# Patient Record
Sex: Female | Born: 1945 | Race: White | Hispanic: No | Marital: Married | Smoking: Former smoker
Health system: Southern US, Community
[De-identification: ages and names within clinical notes are randomized; demographics above are authoritative.]

## PROBLEM LIST (undated history)

## (undated) DIAGNOSIS — M479 Spondylosis, unspecified: Secondary | ICD-10-CM

## (undated) DIAGNOSIS — K52832 Lymphocytic colitis: Secondary | ICD-10-CM

## (undated) DIAGNOSIS — D649 Anemia, unspecified: Secondary | ICD-10-CM

## (undated) DIAGNOSIS — J45909 Unspecified asthma, uncomplicated: Secondary | ICD-10-CM

## (undated) DIAGNOSIS — Z7982 Long term (current) use of aspirin: Secondary | ICD-10-CM

## (undated) DIAGNOSIS — G2581 Restless legs syndrome: Secondary | ICD-10-CM

## (undated) DIAGNOSIS — G629 Polyneuropathy, unspecified: Secondary | ICD-10-CM

## (undated) DIAGNOSIS — I5189 Other ill-defined heart diseases: Secondary | ICD-10-CM

## (undated) DIAGNOSIS — I773 Arterial fibromuscular dysplasia: Secondary | ICD-10-CM

## (undated) DIAGNOSIS — I700 Atherosclerosis of aorta: Secondary | ICD-10-CM

## (undated) DIAGNOSIS — K5710 Diverticulosis of small intestine without perforation or abscess without bleeding: Secondary | ICD-10-CM

## (undated) DIAGNOSIS — G4700 Insomnia, unspecified: Secondary | ICD-10-CM

## (undated) DIAGNOSIS — K4090 Unilateral inguinal hernia, without obstruction or gangrene, not specified as recurrent: Secondary | ICD-10-CM

## (undated) DIAGNOSIS — E785 Hyperlipidemia, unspecified: Secondary | ICD-10-CM

## (undated) DIAGNOSIS — Z7902 Long term (current) use of antithrombotics/antiplatelets: Secondary | ICD-10-CM

## (undated) DIAGNOSIS — K589 Irritable bowel syndrome without diarrhea: Secondary | ICD-10-CM

## (undated) DIAGNOSIS — R0602 Shortness of breath: Secondary | ICD-10-CM

## (undated) DIAGNOSIS — G4733 Obstructive sleep apnea (adult) (pediatric): Secondary | ICD-10-CM

## (undated) DIAGNOSIS — F329 Major depressive disorder, single episode, unspecified: Secondary | ICD-10-CM

## (undated) DIAGNOSIS — T8859XA Other complications of anesthesia, initial encounter: Secondary | ICD-10-CM

## (undated) DIAGNOSIS — I951 Orthostatic hypotension: Secondary | ICD-10-CM

## (undated) DIAGNOSIS — R002 Palpitations: Secondary | ICD-10-CM

## (undated) DIAGNOSIS — K219 Gastro-esophageal reflux disease without esophagitis: Secondary | ICD-10-CM

## (undated) DIAGNOSIS — I209 Angina pectoris, unspecified: Secondary | ICD-10-CM

## (undated) DIAGNOSIS — I341 Nonrheumatic mitral (valve) prolapse: Secondary | ICD-10-CM

## (undated) DIAGNOSIS — H269 Unspecified cataract: Secondary | ICD-10-CM

## (undated) DIAGNOSIS — K5790 Diverticulosis of intestine, part unspecified, without perforation or abscess without bleeding: Secondary | ICD-10-CM

## (undated) DIAGNOSIS — Z87442 Personal history of urinary calculi: Secondary | ICD-10-CM

## (undated) DIAGNOSIS — F32A Depression, unspecified: Secondary | ICD-10-CM

## (undated) DIAGNOSIS — G4730 Sleep apnea, unspecified: Secondary | ICD-10-CM

## (undated) DIAGNOSIS — I471 Supraventricular tachycardia, unspecified: Secondary | ICD-10-CM

## (undated) DIAGNOSIS — K222 Esophageal obstruction: Secondary | ICD-10-CM

## (undated) DIAGNOSIS — R112 Nausea with vomiting, unspecified: Secondary | ICD-10-CM

## (undated) DIAGNOSIS — I701 Atherosclerosis of renal artery: Secondary | ICD-10-CM

## (undated) DIAGNOSIS — F419 Anxiety disorder, unspecified: Secondary | ICD-10-CM

## (undated) DIAGNOSIS — M810 Age-related osteoporosis without current pathological fracture: Secondary | ICD-10-CM

## (undated) DIAGNOSIS — T7840XA Allergy, unspecified, initial encounter: Secondary | ICD-10-CM

## (undated) DIAGNOSIS — R001 Bradycardia, unspecified: Secondary | ICD-10-CM

## (undated) DIAGNOSIS — G43909 Migraine, unspecified, not intractable, without status migrainosus: Secondary | ICD-10-CM

## (undated) DIAGNOSIS — Z9889 Other specified postprocedural states: Secondary | ICD-10-CM

## (undated) DIAGNOSIS — K551 Chronic vascular disorders of intestine: Secondary | ICD-10-CM

## (undated) DIAGNOSIS — M797 Fibromyalgia: Secondary | ICD-10-CM

## (undated) DIAGNOSIS — Z9841 Cataract extraction status, right eye: Secondary | ICD-10-CM

## (undated) DIAGNOSIS — Z8489 Family history of other specified conditions: Secondary | ICD-10-CM

## (undated) DIAGNOSIS — I779 Disorder of arteries and arterioles, unspecified: Secondary | ICD-10-CM

## (undated) DIAGNOSIS — I2510 Atherosclerotic heart disease of native coronary artery without angina pectoris: Secondary | ICD-10-CM

## (undated) DIAGNOSIS — T4145XA Adverse effect of unspecified anesthetic, initial encounter: Secondary | ICD-10-CM

## (undated) DIAGNOSIS — K449 Diaphragmatic hernia without obstruction or gangrene: Secondary | ICD-10-CM

## (undated) DIAGNOSIS — M8580 Other specified disorders of bone density and structure, unspecified site: Secondary | ICD-10-CM

## (undated) HISTORY — PX: TUBAL LIGATION: SHX77

## (undated) HISTORY — DX: Migraine, unspecified, not intractable, without status migrainosus: G43.909

## (undated) HISTORY — DX: Spondylosis, unspecified: M47.9

## (undated) HISTORY — DX: Diaphragmatic hernia without obstruction or gangrene: K44.9

## (undated) HISTORY — DX: Age-related osteoporosis without current pathological fracture: M81.0

## (undated) HISTORY — PX: ROTATOR CUFF REPAIR: SHX139

## (undated) HISTORY — PX: UPPER GASTROINTESTINAL ENDOSCOPY: SHX188

## (undated) HISTORY — PX: BREAST ENHANCEMENT SURGERY: SHX7

## (undated) HISTORY — DX: Irritable bowel syndrome, unspecified: K58.9

## (undated) HISTORY — DX: Sleep apnea, unspecified: G47.30

## (undated) HISTORY — DX: Polyneuropathy, unspecified: G62.9

## (undated) HISTORY — DX: Unspecified cataract: H26.9

## (undated) HISTORY — DX: Fibromyalgia: M79.7

## (undated) HISTORY — DX: Esophageal obstruction: K22.2

## (undated) HISTORY — PX: CATARACT EXTRACTION: SUR2

## (undated) HISTORY — DX: Unspecified asthma, uncomplicated: J45.909

## (undated) HISTORY — PX: ESOPHAGEAL DILATION: SHX303

## (undated) HISTORY — PX: WRIST ARTHROSCOPY: SUR100

## (undated) HISTORY — DX: Diverticulosis of intestine, part unspecified, without perforation or abscess without bleeding: K57.90

## (undated) HISTORY — PX: OTHER SURGICAL HISTORY: SHX169

## (undated) HISTORY — DX: Restless legs syndrome: G25.81

## (undated) HISTORY — PX: COLONOSCOPY: SHX174

## (undated) HISTORY — DX: Depression, unspecified: F32.A

## (undated) HISTORY — PX: SINUS SURGERY WITH INSTATRAK: SHX5215

## (undated) HISTORY — DX: Nonrheumatic mitral (valve) prolapse: I34.1

## (undated) HISTORY — DX: Allergy, unspecified, initial encounter: T78.40XA

## (undated) HISTORY — DX: Anxiety disorder, unspecified: F41.9

## (undated) HISTORY — DX: Anemia, unspecified: D64.9

## (undated) HISTORY — DX: Hyperlipidemia, unspecified: E78.5

## (undated) HISTORY — DX: Diverticulosis of small intestine without perforation or abscess without bleeding: K57.10

## (undated) HISTORY — DX: Gastro-esophageal reflux disease without esophagitis: K21.9

## (undated) HISTORY — DX: Lymphocytic colitis: K52.832

## (undated) HISTORY — DX: Other specified disorders of bone density and structure, unspecified site: M85.80

## (undated) HISTORY — PX: CHOLECYSTECTOMY: SHX55

## (undated) HISTORY — DX: Major depressive disorder, single episode, unspecified: F32.9

---

## 1998-03-26 ENCOUNTER — Emergency Department (HOSPITAL_COMMUNITY): Admission: EM | Admit: 1998-03-26 | Discharge: 1998-03-26 | Payer: Self-pay | Admitting: Emergency Medicine

## 1998-03-26 ENCOUNTER — Encounter: Payer: Self-pay | Admitting: Emergency Medicine

## 1999-09-17 ENCOUNTER — Other Ambulatory Visit: Admission: RE | Admit: 1999-09-17 | Discharge: 1999-09-17 | Payer: Self-pay | Admitting: Gynecology

## 2000-01-01 ENCOUNTER — Encounter: Payer: Self-pay | Admitting: Gastroenterology

## 2000-01-01 DIAGNOSIS — K5730 Diverticulosis of large intestine without perforation or abscess without bleeding: Secondary | ICD-10-CM | POA: Insufficient documentation

## 2000-05-12 ENCOUNTER — Encounter: Admission: RE | Admit: 2000-05-12 | Discharge: 2000-06-09 | Payer: Self-pay | Admitting: Specialist

## 2000-09-22 ENCOUNTER — Other Ambulatory Visit: Admission: RE | Admit: 2000-09-22 | Discharge: 2000-09-22 | Payer: Self-pay | Admitting: Gynecology

## 2001-08-16 ENCOUNTER — Encounter: Payer: Self-pay | Admitting: Emergency Medicine

## 2001-08-16 ENCOUNTER — Inpatient Hospital Stay (HOSPITAL_COMMUNITY): Admission: EM | Admit: 2001-08-16 | Discharge: 2001-08-18 | Payer: Self-pay | Admitting: Emergency Medicine

## 2001-08-17 ENCOUNTER — Encounter: Payer: Self-pay | Admitting: Cardiology

## 2001-08-18 ENCOUNTER — Encounter: Payer: Self-pay | Admitting: Internal Medicine

## 2001-08-21 ENCOUNTER — Emergency Department (HOSPITAL_COMMUNITY): Admission: EM | Admit: 2001-08-21 | Discharge: 2001-08-22 | Payer: Self-pay | Admitting: Emergency Medicine

## 2001-11-22 ENCOUNTER — Other Ambulatory Visit: Admission: RE | Admit: 2001-11-22 | Discharge: 2001-11-22 | Payer: Self-pay | Admitting: Gynecology

## 2002-06-03 ENCOUNTER — Ambulatory Visit (HOSPITAL_BASED_OUTPATIENT_CLINIC_OR_DEPARTMENT_OTHER): Admission: RE | Admit: 2002-06-03 | Discharge: 2002-06-03 | Payer: Self-pay | Admitting: Gynecology

## 2002-07-13 ENCOUNTER — Encounter: Admission: RE | Admit: 2002-07-13 | Discharge: 2002-07-13 | Payer: Self-pay | Admitting: Internal Medicine

## 2002-07-13 ENCOUNTER — Encounter: Payer: Self-pay | Admitting: Internal Medicine

## 2002-08-15 ENCOUNTER — Encounter: Payer: Self-pay | Admitting: Surgery

## 2002-08-15 ENCOUNTER — Encounter: Admission: RE | Admit: 2002-08-15 | Discharge: 2002-08-15 | Payer: Self-pay | Admitting: Surgery

## 2003-01-11 ENCOUNTER — Other Ambulatory Visit: Admission: RE | Admit: 2003-01-11 | Discharge: 2003-01-11 | Payer: Self-pay | Admitting: Gynecology

## 2004-04-26 ENCOUNTER — Ambulatory Visit: Payer: Self-pay | Admitting: Internal Medicine

## 2004-04-29 ENCOUNTER — Ambulatory Visit: Payer: Self-pay | Admitting: Internal Medicine

## 2004-05-01 ENCOUNTER — Ambulatory Visit: Payer: Self-pay

## 2004-05-14 ENCOUNTER — Ambulatory Visit: Payer: Self-pay | Admitting: Internal Medicine

## 2004-07-08 ENCOUNTER — Other Ambulatory Visit: Admission: RE | Admit: 2004-07-08 | Discharge: 2004-07-08 | Payer: Self-pay | Admitting: Gynecology

## 2004-10-22 ENCOUNTER — Ambulatory Visit: Payer: Self-pay | Admitting: Internal Medicine

## 2004-11-27 ENCOUNTER — Ambulatory Visit: Payer: Self-pay | Admitting: Internal Medicine

## 2004-12-11 ENCOUNTER — Ambulatory Visit: Payer: Self-pay | Admitting: Internal Medicine

## 2005-01-10 ENCOUNTER — Ambulatory Visit: Payer: Self-pay | Admitting: Internal Medicine

## 2005-01-16 ENCOUNTER — Ambulatory Visit: Payer: Self-pay | Admitting: Internal Medicine

## 2005-02-11 ENCOUNTER — Ambulatory Visit: Payer: Self-pay | Admitting: Gastroenterology

## 2005-02-17 ENCOUNTER — Ambulatory Visit: Payer: Self-pay | Admitting: Gastroenterology

## 2005-02-17 DIAGNOSIS — K2990 Gastroduodenitis, unspecified, without bleeding: Secondary | ICD-10-CM

## 2005-02-17 DIAGNOSIS — K2970 Gastritis, unspecified, without bleeding: Secondary | ICD-10-CM | POA: Insufficient documentation

## 2005-04-16 ENCOUNTER — Ambulatory Visit: Payer: Self-pay | Admitting: Internal Medicine

## 2005-04-21 ENCOUNTER — Ambulatory Visit: Payer: Self-pay | Admitting: Internal Medicine

## 2005-04-25 ENCOUNTER — Ambulatory Visit: Payer: Self-pay | Admitting: Internal Medicine

## 2005-07-16 ENCOUNTER — Other Ambulatory Visit: Admission: RE | Admit: 2005-07-16 | Discharge: 2005-07-16 | Payer: Self-pay | Admitting: Gynecology

## 2005-10-21 ENCOUNTER — Ambulatory Visit (HOSPITAL_BASED_OUTPATIENT_CLINIC_OR_DEPARTMENT_OTHER): Admission: RE | Admit: 2005-10-21 | Discharge: 2005-10-21 | Payer: Self-pay | Admitting: Otolaryngology

## 2005-10-25 ENCOUNTER — Ambulatory Visit: Payer: Self-pay | Admitting: Internal Medicine

## 2006-02-01 ENCOUNTER — Ambulatory Visit (HOSPITAL_BASED_OUTPATIENT_CLINIC_OR_DEPARTMENT_OTHER): Admission: RE | Admit: 2006-02-01 | Discharge: 2006-02-01 | Payer: Self-pay | Admitting: Otolaryngology

## 2006-04-18 ENCOUNTER — Ambulatory Visit: Payer: Self-pay | Admitting: Family Medicine

## 2006-06-11 ENCOUNTER — Ambulatory Visit: Payer: Self-pay | Admitting: Internal Medicine

## 2006-06-11 LAB — CONVERTED CEMR LAB
ALT: 14 units/L (ref 0–40)
Albumin: 3.8 g/dL (ref 3.5–5.2)
Alkaline Phosphatase: 51 units/L (ref 39–117)
BUN: 11 mg/dL (ref 6–23)
Basophils Absolute: 0 10*3/uL (ref 0.0–0.1)
Calcium: 9.2 mg/dL (ref 8.4–10.5)
Cholesterol: 146 mg/dL (ref 0–200)
GFR calc Af Amer: 130 mL/min
GFR calc non Af Amer: 108 mL/min
HDL: 59.9 mg/dL (ref >39.0)
Hemoglobin: 12.4 g/dL (ref 12.0–15.0)
LDL Cholesterol: 75 mg/dL (ref 0–99)
Lymphocytes Relative: 30.6 % (ref 12.0–46.0)
MCHC: 34 g/dL (ref 30.0–36.0)
Monocytes Absolute: 0.3 10*3/uL (ref 0.2–0.7)
Monocytes Relative: 7.1 % (ref 3.0–11.0)
Neutro Abs: 3 10*3/uL (ref 1.4–7.7)
Platelets: 218 10*3/uL (ref 150–400)
Potassium: 4 meq/L (ref 3.5–5.1)
TSH: 2.29 microintl units/mL (ref 0.35–5.50)
Triglycerides: 56 mg/dL (ref 0–149)
VLDL: 11 mg/dL (ref 0–40)

## 2006-07-09 ENCOUNTER — Ambulatory Visit: Payer: Self-pay | Admitting: Gastroenterology

## 2006-08-06 ENCOUNTER — Other Ambulatory Visit: Admission: RE | Admit: 2006-08-06 | Discharge: 2006-08-06 | Payer: Self-pay | Admitting: Gynecology

## 2006-09-16 ENCOUNTER — Encounter (HOSPITAL_COMMUNITY): Admission: RE | Admit: 2006-09-16 | Discharge: 2006-12-07 | Payer: Self-pay | Admitting: *Deleted

## 2006-11-03 ENCOUNTER — Ambulatory Visit: Payer: Self-pay | Admitting: Gastroenterology

## 2006-12-08 ENCOUNTER — Ambulatory Visit: Payer: Self-pay | Admitting: Gastroenterology

## 2006-12-25 ENCOUNTER — Ambulatory Visit: Payer: Self-pay | Admitting: Gastroenterology

## 2006-12-30 ENCOUNTER — Encounter: Payer: Self-pay | Admitting: Gastroenterology

## 2006-12-30 ENCOUNTER — Ambulatory Visit: Payer: Self-pay | Admitting: Gastroenterology

## 2006-12-30 ENCOUNTER — Encounter: Payer: Self-pay | Admitting: Internal Medicine

## 2006-12-30 DIAGNOSIS — K449 Diaphragmatic hernia without obstruction or gangrene: Secondary | ICD-10-CM

## 2007-01-06 ENCOUNTER — Telehealth (INDEPENDENT_AMBULATORY_CARE_PROVIDER_SITE_OTHER): Payer: Self-pay | Admitting: *Deleted

## 2007-01-11 ENCOUNTER — Ambulatory Visit: Payer: Self-pay | Admitting: Gastroenterology

## 2007-02-09 ENCOUNTER — Ambulatory Visit: Payer: Self-pay | Admitting: Gastroenterology

## 2007-03-09 DIAGNOSIS — M479 Spondylosis, unspecified: Secondary | ICD-10-CM | POA: Insufficient documentation

## 2007-03-09 DIAGNOSIS — E785 Hyperlipidemia, unspecified: Secondary | ICD-10-CM | POA: Insufficient documentation

## 2007-03-09 DIAGNOSIS — Z8719 Personal history of other diseases of the digestive system: Secondary | ICD-10-CM

## 2007-03-09 DIAGNOSIS — K589 Irritable bowel syndrome without diarrhea: Secondary | ICD-10-CM

## 2007-03-09 DIAGNOSIS — F411 Generalized anxiety disorder: Secondary | ICD-10-CM | POA: Insufficient documentation

## 2007-03-09 DIAGNOSIS — IMO0001 Reserved for inherently not codable concepts without codable children: Secondary | ICD-10-CM | POA: Insufficient documentation

## 2007-03-09 DIAGNOSIS — G43909 Migraine, unspecified, not intractable, without status migrainosus: Secondary | ICD-10-CM | POA: Insufficient documentation

## 2007-03-09 DIAGNOSIS — K219 Gastro-esophageal reflux disease without esophagitis: Secondary | ICD-10-CM

## 2007-03-16 ENCOUNTER — Ambulatory Visit: Payer: Self-pay | Admitting: Gastroenterology

## 2007-03-16 ENCOUNTER — Ambulatory Visit (HOSPITAL_COMMUNITY): Admission: RE | Admit: 2007-03-16 | Discharge: 2007-03-16 | Payer: Self-pay | Admitting: Gastroenterology

## 2007-05-02 HISTORY — PX: VAGINAL HYSTERECTOMY: SUR661

## 2007-05-14 ENCOUNTER — Telehealth (INDEPENDENT_AMBULATORY_CARE_PROVIDER_SITE_OTHER): Payer: Self-pay | Admitting: *Deleted

## 2007-05-17 ENCOUNTER — Encounter (INDEPENDENT_AMBULATORY_CARE_PROVIDER_SITE_OTHER): Payer: Self-pay | Admitting: Gynecology

## 2007-05-17 ENCOUNTER — Inpatient Hospital Stay (HOSPITAL_COMMUNITY): Admission: RE | Admit: 2007-05-17 | Discharge: 2007-05-19 | Payer: Self-pay | Admitting: Gynecology

## 2007-05-21 ENCOUNTER — Telehealth (INDEPENDENT_AMBULATORY_CARE_PROVIDER_SITE_OTHER): Payer: Self-pay | Admitting: *Deleted

## 2007-05-24 ENCOUNTER — Telehealth (INDEPENDENT_AMBULATORY_CARE_PROVIDER_SITE_OTHER): Payer: Self-pay | Admitting: *Deleted

## 2007-08-11 ENCOUNTER — Ambulatory Visit: Payer: Self-pay | Admitting: Internal Medicine

## 2007-10-01 ENCOUNTER — Telehealth (INDEPENDENT_AMBULATORY_CARE_PROVIDER_SITE_OTHER): Payer: Self-pay | Admitting: *Deleted

## 2007-10-11 ENCOUNTER — Ambulatory Visit: Payer: Self-pay | Admitting: Internal Medicine

## 2007-10-17 LAB — CONVERTED CEMR LAB
AST: 18 units/L (ref 0–37)
Basophils Absolute: 0 10*3/uL (ref 0.0–0.1)
Basophils Relative: 0.4 % (ref 0.0–3.0)
Calcium: 9.2 mg/dL (ref 8.4–10.5)
Chloride: 108 meq/L (ref 96–112)
Cholesterol: 199 mg/dL (ref 0–200)
Creatinine, Ser: 0.7 mg/dL (ref 0.4–1.2)
Eosinophils Absolute: 0.1 10*3/uL (ref 0.0–0.7)
GFR calc non Af Amer: 90 mL/min
HDL: 56.3 mg/dL (ref >39.0)
Hgb A1c MFr Bld: 6.1 % — ABNORMAL HIGH (ref 4.6–6.0)
MCHC: 34 g/dL (ref 30.0–36.0)
MCV: 90.8 fL (ref 78.0–100.0)
Neutrophils Relative %: 58.3 % (ref 43.0–77.0)
Platelets: 220 10*3/uL (ref 150–400)
RBC: 4.25 M/uL (ref 3.87–5.11)
RDW: 13.3 % (ref 11.5–14.6)
Sodium: 143 meq/L (ref 135–145)
TSH: 5.06 microintl units/mL (ref 0.35–5.50)
Total Bilirubin: 0.7 mg/dL (ref 0.3–1.2)
Triglycerides: 85 mg/dL (ref 0–149)
VLDL: 17 mg/dL (ref 0–40)

## 2007-10-18 ENCOUNTER — Encounter (INDEPENDENT_AMBULATORY_CARE_PROVIDER_SITE_OTHER): Payer: Self-pay | Admitting: *Deleted

## 2007-10-22 ENCOUNTER — Telehealth (INDEPENDENT_AMBULATORY_CARE_PROVIDER_SITE_OTHER): Payer: Self-pay | Admitting: *Deleted

## 2007-11-17 ENCOUNTER — Telehealth (INDEPENDENT_AMBULATORY_CARE_PROVIDER_SITE_OTHER): Payer: Self-pay | Admitting: *Deleted

## 2007-12-01 ENCOUNTER — Encounter: Payer: Self-pay | Admitting: Internal Medicine

## 2007-12-16 ENCOUNTER — Telehealth: Payer: Self-pay | Admitting: Gastroenterology

## 2007-12-21 ENCOUNTER — Encounter: Payer: Self-pay | Admitting: Internal Medicine

## 2007-12-22 ENCOUNTER — Ambulatory Visit: Payer: Self-pay | Admitting: Internal Medicine

## 2007-12-22 DIAGNOSIS — G609 Hereditary and idiopathic neuropathy, unspecified: Secondary | ICD-10-CM

## 2007-12-22 DIAGNOSIS — R7309 Other abnormal glucose: Secondary | ICD-10-CM

## 2007-12-24 ENCOUNTER — Encounter (INDEPENDENT_AMBULATORY_CARE_PROVIDER_SITE_OTHER): Payer: Self-pay | Admitting: *Deleted

## 2007-12-24 ENCOUNTER — Telehealth (INDEPENDENT_AMBULATORY_CARE_PROVIDER_SITE_OTHER): Payer: Self-pay | Admitting: *Deleted

## 2007-12-24 LAB — CONVERTED CEMR LAB
Free T4: 0.7 ng/dL (ref 0.6–1.6)
TSH: 2.36 microintl units/mL (ref 0.35–5.50)

## 2008-01-06 ENCOUNTER — Encounter: Payer: Self-pay | Admitting: Gastroenterology

## 2008-04-12 ENCOUNTER — Telehealth: Payer: Self-pay | Admitting: Gastroenterology

## 2008-06-21 ENCOUNTER — Telehealth (INDEPENDENT_AMBULATORY_CARE_PROVIDER_SITE_OTHER): Payer: Self-pay | Admitting: *Deleted

## 2008-08-10 ENCOUNTER — Encounter: Payer: Self-pay | Admitting: Internal Medicine

## 2008-10-25 ENCOUNTER — Encounter: Payer: Self-pay | Admitting: Gastroenterology

## 2008-11-23 ENCOUNTER — Emergency Department (HOSPITAL_COMMUNITY): Admission: EM | Admit: 2008-11-23 | Discharge: 2008-11-23 | Payer: Self-pay | Admitting: Emergency Medicine

## 2008-11-23 ENCOUNTER — Telehealth: Payer: Self-pay | Admitting: Internal Medicine

## 2008-12-05 ENCOUNTER — Ambulatory Visit: Payer: Self-pay | Admitting: Gastroenterology

## 2008-12-05 DIAGNOSIS — R079 Chest pain, unspecified: Secondary | ICD-10-CM

## 2008-12-07 ENCOUNTER — Telehealth: Payer: Self-pay | Admitting: Cardiology

## 2008-12-12 ENCOUNTER — Ambulatory Visit: Payer: Self-pay | Admitting: Cardiovascular Disease

## 2008-12-12 DIAGNOSIS — R072 Precordial pain: Secondary | ICD-10-CM | POA: Insufficient documentation

## 2008-12-25 ENCOUNTER — Ambulatory Visit: Payer: Self-pay | Admitting: Internal Medicine

## 2008-12-26 ENCOUNTER — Encounter: Payer: Self-pay | Admitting: Internal Medicine

## 2008-12-27 ENCOUNTER — Encounter (INDEPENDENT_AMBULATORY_CARE_PROVIDER_SITE_OTHER): Payer: Self-pay | Admitting: *Deleted

## 2008-12-27 LAB — CONVERTED CEMR LAB
ALT: 13 units/L (ref 0–35)
AST: 19 units/L (ref 0–37)
Basophils Absolute: 0 10*3/uL (ref 0.0–0.1)
Bilirubin, Direct: 0.1 mg/dL (ref 0.0–0.3)
Calcium: 9.3 mg/dL (ref 8.4–10.5)
GFR calc non Af Amer: 89.3 mL/min (ref >60)
Glucose, Bld: 87 mg/dL (ref 70–99)
HCT: 40.3 % (ref 36.0–46.0)
Lymphs Abs: 1.8 10*3/uL (ref 0.7–4.0)
MCHC: 33.9 g/dL (ref 30.0–36.0)
MCV: 93 fL (ref 78.0–100.0)
Monocytes Absolute: 0.4 10*3/uL (ref 0.1–1.0)
Platelets: 209 10*3/uL (ref 150.0–400.0)
RDW: 12.8 % (ref 11.5–14.6)
Sodium: 143 meq/L (ref 135–145)
Total Bilirubin: 0.7 mg/dL (ref 0.3–1.2)

## 2008-12-28 ENCOUNTER — Telehealth (INDEPENDENT_AMBULATORY_CARE_PROVIDER_SITE_OTHER): Payer: Self-pay | Admitting: *Deleted

## 2009-01-01 ENCOUNTER — Ambulatory Visit: Payer: Self-pay | Admitting: Internal Medicine

## 2009-01-01 ENCOUNTER — Ambulatory Visit (HOSPITAL_COMMUNITY): Admission: RE | Admit: 2009-01-01 | Discharge: 2009-01-01 | Payer: Self-pay | Admitting: Cardiovascular Disease

## 2009-01-01 ENCOUNTER — Ambulatory Visit: Payer: Self-pay

## 2009-01-01 ENCOUNTER — Encounter (HOSPITAL_COMMUNITY): Admission: RE | Admit: 2009-01-01 | Discharge: 2009-03-01 | Payer: Self-pay | Admitting: Cardiovascular Disease

## 2009-01-01 ENCOUNTER — Encounter: Payer: Self-pay | Admitting: Cardiovascular Disease

## 2009-01-09 ENCOUNTER — Ambulatory Visit: Payer: Self-pay | Admitting: Cardiovascular Disease

## 2009-01-10 ENCOUNTER — Telehealth: Payer: Self-pay | Admitting: Internal Medicine

## 2009-01-10 LAB — CONVERTED CEMR LAB
CO2: 23 meq/L (ref 19–32)
Calcium: 9.4 mg/dL (ref 8.4–10.5)
Hemoglobin: 12.4 g/dL (ref 12.0–15.0)
INR: 1.08 (ref <1.50)
RBC: 4.14 M/uL (ref 3.87–5.11)
RDW: 13.2 % (ref 11.5–15.5)
Sodium: 139 meq/L (ref 135–145)

## 2009-01-16 ENCOUNTER — Inpatient Hospital Stay (HOSPITAL_BASED_OUTPATIENT_CLINIC_OR_DEPARTMENT_OTHER): Admission: RE | Admit: 2009-01-16 | Discharge: 2009-01-16 | Payer: Self-pay | Admitting: Cardiovascular Disease

## 2009-01-16 ENCOUNTER — Ambulatory Visit: Payer: Self-pay | Admitting: Cardiovascular Disease

## 2009-02-13 ENCOUNTER — Ambulatory Visit: Payer: Self-pay | Admitting: Cardiovascular Disease

## 2009-03-08 ENCOUNTER — Encounter: Payer: Self-pay | Admitting: Internal Medicine

## 2009-06-11 ENCOUNTER — Ambulatory Visit: Payer: Self-pay | Admitting: Internal Medicine

## 2009-06-11 DIAGNOSIS — R042 Hemoptysis: Secondary | ICD-10-CM | POA: Insufficient documentation

## 2009-06-12 ENCOUNTER — Telehealth (INDEPENDENT_AMBULATORY_CARE_PROVIDER_SITE_OTHER): Payer: Self-pay | Admitting: *Deleted

## 2009-06-12 ENCOUNTER — Ambulatory Visit: Payer: Self-pay | Admitting: Internal Medicine

## 2009-06-13 LAB — CONVERTED CEMR LAB
Basophils Relative: 0.4 % (ref 0.0–3.0)
Eosinophils Relative: 0.4 % (ref 0.0–5.0)
HCT: 38.6 % (ref 36.0–46.0)
Hemoglobin: 13.1 g/dL (ref 12.0–15.0)
Lymphs Abs: 2.1 10*3/uL (ref 0.7–4.0)
Monocytes Relative: 9.2 % (ref 3.0–12.0)
Neutro Abs: 2.9 10*3/uL (ref 1.4–7.7)
RBC: 4.24 M/uL (ref 3.87–5.11)
RDW: 13.6 % (ref 11.5–14.6)

## 2009-06-25 ENCOUNTER — Ambulatory Visit: Payer: Self-pay | Admitting: Internal Medicine

## 2009-06-25 DIAGNOSIS — R5383 Other fatigue: Secondary | ICD-10-CM | POA: Insufficient documentation

## 2009-06-25 DIAGNOSIS — H9209 Otalgia, unspecified ear: Secondary | ICD-10-CM | POA: Insufficient documentation

## 2009-06-25 DIAGNOSIS — R0602 Shortness of breath: Secondary | ICD-10-CM | POA: Insufficient documentation

## 2009-06-26 ENCOUNTER — Ambulatory Visit: Payer: Self-pay | Admitting: Internal Medicine

## 2009-06-26 LAB — CONVERTED CEMR LAB
AST: 19 units/L (ref 0–37)
Albumin: 4.1 g/dL (ref 3.5–5.2)
Alkaline Phosphatase: 45 units/L (ref 39–117)
BUN: 11 mg/dL (ref 6–23)
Basophils Absolute: 0 10*3/uL (ref 0.0–0.1)
Bilirubin, Direct: 0 mg/dL (ref 0.0–0.3)
CO2: 32 meq/L (ref 19–32)
Calcium: 9.3 mg/dL (ref 8.4–10.5)
Creatinine, Ser: 0.6 mg/dL (ref 0.4–1.2)
Eosinophils Absolute: 0.1 10*3/uL (ref 0.0–0.7)
Free T4: 0.9 ng/dL (ref 0.6–1.6)
Glucose, Bld: 89 mg/dL (ref 70–99)
Lymphocytes Relative: 29 % (ref 12.0–46.0)
MCHC: 34.2 g/dL (ref 30.0–36.0)
MCV: 90.9 fL (ref 78.0–100.0)
Monocytes Absolute: 0.3 10*3/uL (ref 0.1–1.0)
Neutro Abs: 2.9 10*3/uL (ref 1.4–7.7)
Neutrophils Relative %: 62.4 % (ref 43.0–77.0)
RDW: 14.1 % (ref 11.5–14.6)
Total Protein: 6.3 g/dL (ref 6.0–8.3)

## 2009-06-27 ENCOUNTER — Telehealth (INDEPENDENT_AMBULATORY_CARE_PROVIDER_SITE_OTHER): Payer: Self-pay | Admitting: *Deleted

## 2009-06-28 ENCOUNTER — Telehealth (INDEPENDENT_AMBULATORY_CARE_PROVIDER_SITE_OTHER): Payer: Self-pay | Admitting: *Deleted

## 2009-06-28 DIAGNOSIS — F411 Generalized anxiety disorder: Secondary | ICD-10-CM | POA: Insufficient documentation

## 2009-07-04 ENCOUNTER — Encounter: Payer: Self-pay | Admitting: Internal Medicine

## 2009-07-09 LAB — CONVERTED CEMR LAB: Metanephrines, Ur: 130 (ref 90–315)

## 2009-08-21 ENCOUNTER — Encounter: Payer: Self-pay | Admitting: Internal Medicine

## 2009-09-06 ENCOUNTER — Telehealth (INDEPENDENT_AMBULATORY_CARE_PROVIDER_SITE_OTHER): Payer: Self-pay | Admitting: *Deleted

## 2009-09-14 ENCOUNTER — Ambulatory Visit: Payer: Self-pay | Admitting: Internal Medicine

## 2009-09-14 ENCOUNTER — Ambulatory Visit (HOSPITAL_COMMUNITY): Admission: RE | Admit: 2009-09-14 | Discharge: 2009-09-14 | Payer: Self-pay | Admitting: Internal Medicine

## 2009-09-14 DIAGNOSIS — T8549XA Other mechanical complication of breast prosthesis and implant, initial encounter: Secondary | ICD-10-CM | POA: Insufficient documentation

## 2009-09-14 DIAGNOSIS — R0989 Other specified symptoms and signs involving the circulatory and respiratory systems: Secondary | ICD-10-CM

## 2009-09-17 ENCOUNTER — Telehealth (INDEPENDENT_AMBULATORY_CARE_PROVIDER_SITE_OTHER): Payer: Self-pay | Admitting: *Deleted

## 2009-09-18 ENCOUNTER — Telehealth (INDEPENDENT_AMBULATORY_CARE_PROVIDER_SITE_OTHER): Payer: Self-pay | Admitting: *Deleted

## 2009-10-02 ENCOUNTER — Ambulatory Visit: Payer: Self-pay | Admitting: Internal Medicine

## 2009-10-03 ENCOUNTER — Encounter: Payer: Self-pay | Admitting: Internal Medicine

## 2009-12-27 ENCOUNTER — Ambulatory Visit: Payer: Self-pay | Admitting: Internal Medicine

## 2009-12-27 DIAGNOSIS — M549 Dorsalgia, unspecified: Secondary | ICD-10-CM | POA: Insufficient documentation

## 2009-12-27 DIAGNOSIS — M810 Age-related osteoporosis without current pathological fracture: Secondary | ICD-10-CM

## 2009-12-31 LAB — CONVERTED CEMR LAB
ALT: 15 units/L (ref 0–35)
Albumin: 4.1 g/dL (ref 3.5–5.2)
BUN: 13 mg/dL (ref 6–23)
Basophils Absolute: 0 10*3/uL (ref 0.0–0.1)
Basophils Relative: 0.6 % (ref 0.0–3.0)
Bilirubin, Direct: 0.1 mg/dL (ref 0.0–0.3)
CO2: 32 meq/L (ref 19–32)
Calcium: 9 mg/dL (ref 8.4–10.5)
Chloride: 99 meq/L (ref 96–112)
Cholesterol: 154 mg/dL (ref 0–200)
Creatinine, Ser: 0.6 mg/dL (ref 0.4–1.2)
Glucose, Bld: 81 mg/dL (ref 70–99)
HCT: 35.3 % — ABNORMAL LOW (ref 36.0–46.0)
HDL: 59 mg/dL (ref >39.00)
Hemoglobin: 12.2 g/dL (ref 12.0–15.0)
Lymphocytes Relative: 24.4 % (ref 12.0–46.0)
Lymphs Abs: 1.6 10*3/uL (ref 0.7–4.0)
Monocytes Relative: 6.3 % (ref 3.0–12.0)
Neutro Abs: 4.5 10*3/uL (ref 1.4–7.7)
RBC: 3.83 M/uL — ABNORMAL LOW (ref 3.87–5.11)
RDW: 14.4 % (ref 11.5–14.6)
TSH: 1.93 microintl units/mL (ref 0.35–5.50)
Total CHOL/HDL Ratio: 3
Total Protein: 6.1 g/dL (ref 6.0–8.3)
Triglycerides: 45 mg/dL (ref 0.0–149.0)
Vitamin B-12: 458 pg/mL (ref 211–911)

## 2010-01-15 ENCOUNTER — Telehealth (INDEPENDENT_AMBULATORY_CARE_PROVIDER_SITE_OTHER): Payer: Self-pay | Admitting: *Deleted

## 2010-01-15 ENCOUNTER — Ambulatory Visit: Payer: Self-pay | Admitting: Internal Medicine

## 2010-01-15 DIAGNOSIS — D649 Anemia, unspecified: Secondary | ICD-10-CM

## 2010-01-16 ENCOUNTER — Encounter: Payer: Self-pay | Admitting: Internal Medicine

## 2010-02-06 ENCOUNTER — Encounter: Payer: Self-pay | Admitting: Internal Medicine

## 2010-02-21 ENCOUNTER — Telehealth: Payer: Self-pay | Admitting: Internal Medicine

## 2010-03-13 ENCOUNTER — Ambulatory Visit
Admission: RE | Admit: 2010-03-13 | Discharge: 2010-03-13 | Payer: Self-pay | Source: Home / Self Care | Attending: Internal Medicine | Admitting: Internal Medicine

## 2010-03-19 ENCOUNTER — Ambulatory Visit: Admit: 2010-03-19 | Payer: Self-pay | Admitting: Gastroenterology

## 2010-03-20 ENCOUNTER — Ambulatory Visit
Admission: RE | Admit: 2010-03-20 | Discharge: 2010-03-20 | Payer: Self-pay | Source: Home / Self Care | Attending: Internal Medicine | Admitting: Internal Medicine

## 2010-03-20 DIAGNOSIS — R091 Pleurisy: Secondary | ICD-10-CM | POA: Insufficient documentation

## 2010-03-22 ENCOUNTER — Telehealth (INDEPENDENT_AMBULATORY_CARE_PROVIDER_SITE_OTHER): Payer: Self-pay | Admitting: *Deleted

## 2010-04-02 NOTE — Progress Notes (Signed)
Summary: Pravastatin refill  Phone Note Refill Request Message from:  Fax from Pharmacy on January 15, 2010 1:05 PM  Refills Requested: Medication #1:  PRAVASTATIN SODIUM 40 MG  TABS Take one tablet by mouth daily [facility],  [address], [name], [place]  fax [phone]   qty 90  Initial call taken by: Jerolyn Shin,  January 15, 2010 1:06 PM    Prescriptions: PRAVASTATIN SODIUM 40 MG  TABS (PRAVASTATIN SODIUM) Take one tablet by mouth daily  #90 x 3   Entered by:   Shonna Chock CMA   Authorized by:   Marga Melnick MD   Signed by:   Shonna Chock CMA on 01/15/2010   Method used:   Electronically to        [facility]* (retail)       [address]       [place], [place]  [address]       Ph: [phone]       Fax: [phone]   RxID:   [id number]

## 2010-04-02 NOTE — Progress Notes (Signed)
Summary: Surgical clearance/scheduled [phone]  Phone Note Call from Patient Call back at Home Phone [phone]   Caller: Patient Summary of Call: Pt called and left a voicemail, asking if Dr.Hopp would clear her for surgery. She had breast implants 5 years ago. She has one that is starting to leak. It has been going on for 8-9 months. Her CPX with you was 12/25/08 and she had an EKG 06/25/09. Dr. Barbaraann Cao is asking that you write a letter for him if you will clear her for surgery. Is this something that you will do, or do you need to see the pt again. Army Fossa CMA  September 06, 2009 2:52 PM   Follow-up for Phone Call        I'm sorry but I would need to see her because of symptoms she had in 06/25/2009( chest pain, shortness of breath). I would need to document surgery is low risk  Follow-up by: Marga Melnick MD,  September 07, 2009 4:15 AM  Additional Follow-up for Phone Call Additional follow up Details #1::        patient returned call - scheduled an appt [id number] - 1:30 Okey Regal [place]  September 07, 2009 3:59 PM..

## 2010-04-02 NOTE — Letter (Signed)
Summary: Beather Arbour MD  Beather Arbour MD   Imported By: Lanelle Bal 08/29/2009 10:00:03  _____________________________________________________________________  External Attachment:    Type:   Image     Comment:   External Document

## 2010-04-02 NOTE — Progress Notes (Signed)
Summary: FYI FYI change in pt condition  Phone Note Call from Patient Call back at [phone]   Caller: Daughter(Theresa  Long) Summary of Call: pt daughter called concern about her mother health. pt daughter complains that pt is very spaced out, forgetful and confused at times. Pt daughter states that something is wrong with her mother she is just unsure. pt daughter thinks that pt may have had a mini stroke. Advise pt daughter that we are unable to discuss specific health issue about pt with her, but if she would like to express her concerns the best way would be for her to come to OV with pt that way she will be in the loop about pt care. pt daughter states that she will discuss coming to next OV with her mother. Advise daughter will forward concern to dr hopper, daughter ok.................Marland KitchenFelecia Deloach CMA  June 27, 2009 9:44 AM   Follow-up for Phone Call        Spoke with patient Re: Labs-patient aware that all labs normal and copy to be mailed. Dr.Hopper made not on printed labs " congratulations-great results thankfully" "Screening test reveals no evidence of pulmonary embolus (clott)" Follow-up by: Shonna Chock,  June 27, 2009 10:07 AM  Additional Follow-up for Phone Call Additional follow up Details #1::        based on her daughter's concerns I recommend she come in with her mother if her mother allows as history is different than daughter relates Additional Follow-up by: Marga Melnick MD,  June 27, 2009 4:47 PM    Additional Follow-up for Phone Call Additional follow up Details #2::    I left a message on VM for patient's daughter informing hr again that Dr.Hopper is aware of the concerns that she has for her mom and recommends that she come in with her mother./[facility]  June 27, 2009 5:03 PM

## 2010-04-02 NOTE — Consult Note (Signed)
Summary: [facility]  [facility]   Imported By: Lanelle Bal 03/20/2009 09:49:19  _____________________________________________________________________  External Attachment:    Type:   Image     Comment:   External Document

## 2010-04-02 NOTE — Progress Notes (Signed)
Summary: Request for records  Phone Note From Other Clinic Call back at [phone]   Caller: [facility] Summary of Call: Please fax information that states patient is cleared for surgery to [phone] (EKG/History & Physical)  Shonna Chock CMA  September 18, 2009 12:15 PM   Follow-up for Phone Call        Records faxed  Follow-up by: Shonna Chock CMA,  September 18, 2009 3:20 PM

## 2010-04-02 NOTE — Assessment & Plan Note (Signed)
Summary: tonsil and ear pain//lch   Vital Signs:  Patient profile:   65 year old female Weight:      132.2 pounds Temp:     98.3 degrees F oral Pulse rate:   64 / minute BP sitting:   98 / 60  (left arm) Cuff size:   regular  Vitals Entered By: Shonna Chock (June 25, 2009 4:00 PM) CC: Ongoing Symptoms x 4 weeks- Patient was treated with Avelox and Cough Syrup on 06/11/09 and symptoms never cleared completely Comments REVIEWED MED LIST, PATIENT AGREED DOSE AND INSTRUCTION CORRECT  [facility] and cancled RX. Sent to [facility]  June 25, 2009 5:31 PM    Primary Care Provider:  Marga Melnick, MD  CC:  Ongoing Symptoms x 4 weeks- Patient was treated with Avelox and Cough Syrup on 06/11/09 and symptoms never cleared completely.  History of Present Illness:       This is a 65 year old female who presents with Chest pain which has progressed since last  office visit,a period of  4 weeks.  The patient reports resting chest pain, nausea, shortness of breath, dizziness, and light headedness, but denies exertional chest pain, vomiting, diaphoresis, palpitations, syncope, and indigestion.  The pain is described as essentially  constant tightness.  The pain is located in the substernal area.  The pain radiates to the back.  The pain is brought on or made worse by coughing and deep breathing.  The pain is relieved or improved with rest.   She quit smoking in 2005 after 20 years up to 1 ppd.  Allergies (verified): No Known Drug Allergies  Review of Systems General:  Complains of fatigue; denies chills, fever, and sweats. ENT:  Complains of difficulty swallowing, ear discharge, hoarseness, and sore throat; Minor head congestion & sinus pressure ; no purulence. "Throat feels like it is getting smaller". L tonsils sore. CV:  Complains of shortness of breath with exertion; denies difficulty breathing at night, difficulty breathing while lying down, and leg cramps with  exertion. Resp:  Complains of cough; denies coughing up blood, sputum productive, and wheezing. GI:  Denies bloody stools and dark tarry stools.  Physical Exam  General:  Thinbut in no acute distress; alert,appropriate and cooperative throughout examination Ears:  R ear normal.   Wax on L. L TM WNL after removal of impaction Nose:  External nasal examination shows no deformity or inflammation. Nasal mucosa are pink and moist without lesions or exudates. Mouth:  Oral mucosa and oropharynx without lesions or exudates.  Teeth in good repair. Lungs:  Normal respiratory effort, chest expands symmetrically. Lungs are clear to auscultation, no crackles or wheezes. Heart:  normal rate, regular rhythm, no murmur, no gallop, no rub, no JVD, and no HJR.  S4 Abdomen:  Bowel sounds positive,abdomen soft and non-tender without masses, organomegaly or hernias noted. Aorta bruit without AAA Pulses:  R and L carotid,radial,dorsalis pedis and posterior tibial pulses are full and equal bilaterally Extremities:  No clubbing, cyanosis, edema. Negative Homan's Neurologic:  alert & oriented X3.   Skin:  Intact without suspicious lesions or rashes Cervical Nodes:  Small L posterior LA Axillary Nodes:  No palpable lymphadenopathy Psych:  flat affect and subdued.     Impression & Recommendations:  Problem # 1:  CHEST TIGHTNESS-PRESSURE-OTHER ([id number])  Problem # 2:  DYSPNEA/SHORTNESS OF BREATH (ICD-786.09)  Her updated medication list for this problem includes:    Symbicort 160-4.5 Mcg/act Aero (Budesonide-formoterol fumarate) .[name]... 1-2 puffs  every 12 hrs  Problem # 3:  EAR PAIN, LEFT (ICD-388.70) Impaction on L, removed with gavage The following medications were removed from the medication list:    Avelox Abc Pack 400 Mg Tabs (Moxifloxacin hcl) .[name]... 1 once daily  Problem # 4:  FATIGUE (ICD-780.79)  Complete Medication List: 1)  Pravastatin Sodium 40 Mg Tabs (Pravastatin sodium) .... Take one tablet  by mouth daily 2)  Gabapentin 600 Mg Tabs (Gabapentin) .[name].. 1 by mouth three times a day 3)  Trazodone Hcl 100 Mg Tabs (Trazodone hcl) .... Take one tablet daily 4)  Aciphex 20 Mg Tbec (Rabeprazole sodium) .... Take one tablet by mouth two times a day 5)  Aspirin 81 Mg Tbec (Aspirin) .... Take one tablet daily 6)  Selenium 200 Mcg Caps (Selenium) .... Take one tablet daily 7)  Carafate 1 Gm/57ml Susp (Sucralfate) .... Take one teaspoonful by mouth three times a day between meals 8)  Metoclopramide Hcl 10 Mg Tabs (Metoclopramide hcl) .... Take one by mouth three times a day before meals and at bedtime. 9)  Citracal/vitamin D 250-200 Mg-unit Tabs (Calcium citrate-vitamin d) .[name].. 1 by mouth once daily 10)  Magnesium Oxide 250 Mg Tabs (Magnesium oxide) .[name].. 1 by mouth once daily 11)  Vitamin D3 400 Unit Tabs (Cholecalciferol) 12)  Symbicort 160-4.5 Mcg/act Aero (Budesonide-formoterol fumarate) .[name].. 1-2 puffs every 12 hrs 13)  Promethazine Vc/codeine 6.25-5-10 Mg/69ml Syrp (Phenyleph-promethazine-cod) .[name].. 1 tsp every 6 hrs as needed  Other Orders: EKG w/ Interpretation (93000)  Patient Instructions: 1)  Fasting BMP ;D-dimer, Cardiac Panel; 2)  Hepatic Panel ;Sed Rate; 3)  TSH,free T4 ; 4)  CBC w/ Diff .  A lung scan & lung function tests may be necessary. Prescriptions: PROMETHAZINE VC/CODEINE 6.25-5-10 MG/5ML SYRP (PHENYLEPH-PROMETHAZINE-COD) 1 tsp every 6 hrs as needed  #120cc x 1   Entered and Authorized by:   Marga Melnick MD   Signed by:   Marga Melnick MD on 06/25/2009   Method used:   Printed then faxed to ...       [facility]* (retail)       [address]       [place], [place]  [address]       Ph: [phone]       Fax: [phone]   RxID:   [id number] SYMBICORT 160-4.5 MCG/ACT AERO (BUDESONIDE-FORMOTEROL FUMARATE) 1-2 puffs every 12 hrs  #1 x 5   Entered and Authorized by:   Marga Melnick MD   Signed by:   Marga Melnick MD on 06/25/2009   Method used:   Samples  Given   RxID:   [id number]

## 2010-04-02 NOTE — Assessment & Plan Note (Signed)
Summary: COUGH/RH......[name]   Vital Signs:  Patient profile:   65 year old female Weight:      133.2 pounds O2 Sat:      98 % on Room air Temp:     98.6 degrees F oral Pulse rate:   60 / minute Resp:     16 per minute BP sitting:   102 / 60  (left arm) Cuff size:   regular  Vitals Entered By: Shonna Chock (June 11, 2009 2:53 PM)  O2 Flow:  Room air CC: Cough since Wed of last week. Patient took Husbands Amoxicillin bid x 7 days Comments REVIEWED MED LIST, PATIENT AGREED DOSE AND INSTRUCTION CORRECT    Primary Care Provider:  Marga Melnick, MD  CC:  Cough since Wed of last week. Patient took Husbands Amoxicillin bid x 7 days.  History of Present Illness: Onset 06/03/2009 as ST with patches on tonsils , earache, enlarged glands & diffuse aching . No cough until until 04/06; ? fever 04/04. She  took 7 days of Amox of husband's; she has had flu shot. She quit smoking sevearl years ago. No PMH of asthma.  Allergies (verified): No Known Drug Allergies  Review of Systems General:  Denies chills, fever, and sweats. ENT:  Denies ear discharge, earache, nosebleeds, and sore throat; No facial pain ; but frontal headaches & purulence.[name] Resp:  Complains of chest pain with inspiration, cough, coughing up blood, pleuritic, shortness of breath, sputum productive, and wheezing; some blood in yellow  sputum  & occasional blood alone. GI:  Denies bloody stools and dark tarry stools. GU:  Denies hematuria. Heme:  Denies abnormal bruising and bleeding.  Physical Exam  General:  Thin, appears tired but in  no acute distress; alert,appropriate and cooperative throughout examination Ears:  External ear exam shows no significant lesions or deformities.  Otoscopic examination reveals clear canals, tympanic membranes are intact bilaterally without bulging, retraction, inflammation or discharge. Hearing is grossly normal bilaterally.Some wax on L Nose:  External nasal examination shows no deformity or  inflammation. Nasal mucosa are dry without lesions or exudates. Mouth:  Oral mucosa and oropharynx without lesions or exudates.  Teeth in good repair. Lungs:  Normal respiratory effort, chest expands symmetrically. Lungs are clear to auscultation, no crackles or wheezes. Low grade brassy cough, NP Heart:  regular rhythm, no murmur, no gallop, no rub, no JVD, and bradycardia.   Extremities:  No  cyanosis, edema. Slight clubbing. Neg Homan's Skin:  Intact without suspicious lesions or rashes Cervical Nodes:  No lymphadenopathy noted Axillary Nodes:  No palpable lymphadenopathy   Impression & Recommendations:  Problem # 1:  BRONCHITIS-ACUTE (ICD-466.0)  Orders: Venipuncture (16109) TLB-CBC Platelet - w/Differential (85025-CBCD) T-2 View CXR (71020TC)  Her updated medication list for this problem includes:    Avelox Abc Pack 400 Mg Tabs (Moxifloxacin hcl) .[name]... 1 once daily    Promethazine-codeine 6.25-10 Mg/66ml Syrp (Promethazine-codeine) .[name]... 1 tsp every 6 hrs as needed for cough  Problem # 2:  HEMOPTYSIS UNSPECIFIED (ICD-786.30)  Orders: Venipuncture (60454) TLB-CBC Platelet - w/Differential (85025-CBCD) T-2 View CXR (71020TC)  Problem # 3:  SINUSITIS- ACUTE-NOS (ICD-461.9)  Orders: Venipuncture (09811) TLB-CBC Platelet - w/Differential (85025-CBCD)  Her updated medication list for this problem includes:    Avelox Abc Pack 400 Mg Tabs (Moxifloxacin hcl) .[name]... 1 once daily    Promethazine-codeine 6.25-10 Mg/26ml Syrp (Promethazine-codeine) .[name]... 1 tsp every 6 hrs as needed for cough  Complete Medication List: 1)  Pravastatin Sodium 40 Mg Tabs (  Pravastatin sodium) .... Take one tablet by mouth daily 2)  Gabapentin 600 Mg Tabs (Gabapentin) .[name].. 1 by mouth three times a day 3)  Trazodone Hcl 100 Mg Tabs (Trazodone hcl) .... Take one tablet daily 4)  Aciphex 20 Mg Tbec (Rabeprazole sodium) .... Take one tablet by mouth two times a day 5)  Aspirin 81 Mg Tbec (Aspirin) .... Take  one tablet daily 6)  Selenium 200 Mcg Caps (Selenium) .... Take one tablet daily 7)  Carafate 1 Gm/34ml Susp (Sucralfate) .... Take one teaspoonful by mouth three times a day between meals 8)  Metoclopramide Hcl 10 Mg Tabs (Metoclopramide hcl) .... Take one by mouth three times a day before meals and at bedtime. 9)  Citracal/vitamin D 250-200 Mg-unit Tabs (Calcium citrate-vitamin d) .[name].. 1 by mouth once daily 10)  Magnesium Oxide 250 Mg Tabs (Magnesium oxide) .[name].. 1 by mouth once daily 11)  Vitamin D3 400 Unit Tabs (Cholecalciferol) 12)  Avelox Abc Pack 400 Mg Tabs (Moxifloxacin hcl) .[name].. 1 once daily 13)  Promethazine-codeine 6.25-10 Mg/68ml Syrp (Promethazine-codeine) .[name].. 1 tsp every 6 hrs as needed for cough  Patient Instructions: 1)  Neti pot once daily if head congestion. 2)  Drink as much fluid as you can tolerate for the next few days. Prescriptions: PROMETHAZINE-CODEINE 6.25-10 MG/5ML SYRP (PROMETHAZINE-CODEINE) 1 tsp every 6 hrs as needed for cough  #120cc x 0   Entered and Authorized by:   Marga Melnick MD   Signed by:   Marga Melnick MD on 06/11/2009   Method used:   Printed then faxed to ...       [facility]* (retail)       [address]       [place], [place]  [address]       Ph: [phone]       Fax: [phone]   RxID:   [id number] AVELOX ABC PACK 400 MG TABS (MOXIFLOXACIN HCL) 1 once daily  #5 x 0   Entered and Authorized by:   Marga Melnick MD   Signed by:   Marga Melnick MD on 06/11/2009   Method used:   Samples Given   RxID:   [id number]

## 2010-04-02 NOTE — Progress Notes (Signed)
Summary: wants xray results feeling no better  Phone Note Call from Patient Call back at Home Phone [phone]   Caller: Patient Summary of Call: pt called wanted results of CXR and labwork, still feeling bad, coughing and chest through back. recommed pt to Ed if signs severe. cxr  back but not looked at  pt is taking Avelox and cough syrup Initial call taken by: Kandice Hams,  June 12, 2009 4:57 PM  Follow-up for Phone Call        CXray does not show any acute process & blood counts are normal. Avelox is strongest med avilable by mouth . OV Weds afternoon if not improving Follow-up by: Marga Melnick MD,  June 12, 2009 6:17 PM  Additional Follow-up for Phone Call Additional follow up Details #1::        Spoke with patient, patient said she is thinking she is a little better. Patient will continue to monitor symptoms and call first thing tomorrow if she feels the need to be seen Additional Follow-up by: Shonna Chock,  June 13, 2009 10:35 AM

## 2010-04-02 NOTE — Letter (Signed)
Summary: [facility]  [facility]   Imported By: Lanelle Bal 10/22/2009 08:12:08  _____________________________________________________________________  External Attachment:    Type:   Image     Comment:   External Document

## 2010-04-02 NOTE — Progress Notes (Signed)
Summary: 24 hour urine  ---- Converted from flag ---- ---- 06/28/2009 5:40 AM, Marga Melnick MD wrote: Please ask her to pick up kit for 24 hr collection of metanephrines & catecholamines; I want to assess for excess excretion of these stress hormones as they could be related to  the symtoms she describes ------------------------------  Phone Note Outgoing Call   Call placed by: Jeremy Johann CMA,  June 28, 2009 9:23 AM Summary of Call: try to call pt no answer and VM not set up will try again later............Marland KitchenNorth [facility] Deloach CMA  June 28, 2009 9:24 AM  Initial call taken by: Jeremy Johann CMA,  June 28, 2009 9:23 AM  Follow-up for Phone Call        Spoke with patient, patient aware to come in and pick-up kit, patient was informed of why this test was ordered.    Follow-up by: Shonna Chock,  June 28, 2009 3:02 PM  New Problems: ANXIETY STATE, UNSPECIFIED (ICD-300.00)   New Problems: ANXIETY STATE, UNSPECIFIED (ICD-300.00)

## 2010-04-02 NOTE — Progress Notes (Signed)
Summary: U/S Results  Phone Note Outgoing Call Call back at Home Phone [phone]   Call placed by: Shonna Chock CMA,  September 17, 2009 4:36 PM Call placed to: Patient Summary of Call: Spoke with patient about results below:  Fantastic ; I was really concerned you might have an aneurysm. You are cleared for surgery. Hopp  Patient ok'd and aware copy to be Harriett Rush CMA  September 17, 2009 4:36 PM

## 2010-04-02 NOTE — Assessment & Plan Note (Signed)
Summary: clear for surgery/cbs   Vital Signs:  Patient profile:   65 year old female Height:      68.5 inches Weight:      130.6 pounds Temp:     97.8 degrees F oral Pulse rate:   72 / minute Resp:     16 per minute BP sitting:   102 / 68  (left arm) Cuff size:   regular  Vitals Entered By: Shonna Chock CMA (September 14, 2009 1:47 PM) CC: Surgical Clearance: Repair Implant, Pre-op Evaluation   Primary Care Provider:  Marga Melnick, MD  CC:  Surgical Clearance: Repair Implant and Pre-op Evaluation.  History of Present Illness: Left breast implant began leaking its saline ? 8 months ago based on change in size of breast. Dr. Greggory Stallion Aide plans surgery 10/12/2009 @ [facility]( FAX [phone])The patient denies respiratory symptoms, GI bleeding, chest pain, edema, PND, and smoking.  All symptoms  for which seen 06/25/2009 have resolved with bronchodilator therapy; this was discontinued over 6-8 weeks ago.  Current Medications (verified): 1)  Pravastatin Sodium 40 Mg  Tabs (Pravastatin Sodium) .... Take One Tablet By Mouth Daily 2)  Gabapentin 600 Mg Tabs (Gabapentin) .[name].. 1 By Mouth Three Times A Day 3)  Trazodone Hcl 100 Mg  Tabs (Trazodone Hcl) .... Take One Tablet Daily 4)  Aciphex 20 Mg  Tbec (Rabeprazole Sodium) .... Take One Tablet By Mouth Two Times A Day 5)  Aspirin 81 Mg  Tbec (Aspirin) .... Take One Tablet Daily, Holding 6)  Selenium 200 Mcg  Caps (Selenium) .... Take One Tablet Daily 7)  Carafate 1 Gm/59ml  Susp (Sucralfate) .... Take One Teaspoonful By Mouth Three Times A Day Between Meals 8)  Citracal/vitamin D 250-200 Mg-Unit Tabs (Calcium Citrate-Vitamin D) .[name].. 1 By Mouth Once Daily 9)  Magnesium Oxide 250 Mg Tabs (Magnesium Oxide) .[name].. 1 By Mouth Once Daily 10)  Vitamin D3 400 Unit Tabs (Cholecalciferol) 11)  Promethazine Vc/codeine 6.25-5-10 Mg/43ml Syrp (Phenyleph-Promethazine-Cod) .[name].. 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 ENT:  Denies difficulty swallowing, earache, hoarseness, and sore throat. CV:  Denies difficulty breathing at night, difficulty breathing while lying down, lightheadness, near fainting, shortness of breath with exertion, swelling of feet, and swelling of hands. Resp:  Denies cough, sputum productive, and wheezing. GI:  Denies abdominal pain, bloody stools, and dark tarry stools. GU:  Complains of urinary frequency; denies discharge, dysuria, and hematuria. Psych:  Denies anxiety, depression, easily angered, easily tearful, and irritability. Endo:  Denies excessive hunger, excessive thirst, and excessive urination. Heme:  Denies abnormal bruising and bleeding.  Physical Exam  General:  Thin but well-nourished,in no acute distress; alert,appropriate and cooperative throughout examination Neck:  No deformities, masses, or tenderness noted. Lungs:  Normal respiratory effort, chest  expands symmetrically. Lungs are clear to auscultation, no crackles or wheezes. Heart:  Normal rate and regular rhythm. S1 and S2 normal without gallop,  murmur, click, rub .S4(Note: PMH of MVP , clinically quiescent on exam today) Abdomen:  Bowel sounds positive,abdomen soft and non-tender without masses, organomegaly or hernias noted. Aortic bruit ; ? AAA of 6 cm (clinically) Msk:  No deformity or scoliosis noted of thoracic or lumbar spine.   Pulses:  R and L carotid,radial,dorsalis pedis and posterior tibial pulses are full and equal bilaterally Extremities:  No clubbing, cyanosis, edema. Neurologic:  alert & oriented X3 and DTRs symmetrical and normal.   Skin:  Intact without suspicious lesions or rashes Cervical Nodes:  No lymphadenopathy noted Axillary Nodes:  No palpable lymphadenopathy Psych:  memory intact for recent and remote, normally interactive, good eye contact, not anxious appearing, and not depressed appearing.     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 6 cm aortic enlargement; R/O AAA   Orders: Radiology Referral (Radiology)  Complete Medication List: 1)  Pravastatin Sodium 40 Mg Tabs (Pravastatin sodium) .... Take one tablet by mouth daily 2)  Gabapentin 600 Mg Tabs (Gabapentin) .[name].. 1 by mouth three times a day 3)  Trazodone Hcl 100 Mg Tabs (Trazodone hcl) .... Take one tablet daily 4)  Aciphex 20 Mg Tbec (Rabeprazole sodium) .... Take one tablet by mouth two times a day 5)  Aspirin 81 Mg Tbec (Aspirin) .... Take one tablet daily, holding 6)  Selenium 200 Mcg Caps (Selenium) .... Take one tablet daily 7)  Carafate 1 Gm/43ml Susp (Sucralfate) .... Take one teaspoonful by mouth three times a day between meals 8)  Citracal/vitamin D 250-200 Mg-unit Tabs (Calcium citrate-vitamin d) .[name].. 1 by mouth once daily 9)  Magnesium Oxide 250 Mg Tabs (Magnesium oxide) .[name].. 1 by mouth once daily 10)  Vitamin D3 400 Unit Tabs (Cholecalciferol) 11)  Promethazine Vc/codeine 6.25-5-10 Mg/18ml Syrp  (Phenyleph-promethazine-cod) .[name].. 1 tsp every 6 hrs as needed  Patient Instructions: 1)  You are cleared for Surgery if [place] is negative.

## 2010-04-02 NOTE — Assessment & Plan Note (Signed)
Summary: NEEDS 2ND EKG FOR SURGERY CLEARANCE////SPH   Vital Signs:  Patient profile:   65 year old female Weight:      129.6 pounds BMI:     19.49 Pulse rate:   72 / minute BP sitting:   98 / 62  (left arm) Cuff size:   regular  Vitals Entered By: Shonna Chock CMA (October 02, 2009 3:22 PM) CC: Patient here for nurse visit only for EKG, patient would like EKG sent to [facility]   Primary Care Provider:  Marga Melnick, MD  CC:  Patient here for nurse visit only for EKG and patient would like EKG sent to [facility].  History of Present Illness: Here for pre op EKG; it was  interpreted & FAXed to her surgeon as per his request.  Current Medications (verified): 1)  Pravastatin Sodium 40 Mg  Tabs (Pravastatin Sodium) .... Take One Tablet By Mouth Daily 2)  Gabapentin 600 Mg Tabs (Gabapentin) .[name].. 1 By Mouth Three Times A Day 3)  Trazodone Hcl 100 Mg  Tabs (Trazodone Hcl) .... Take One Tablet Daily 4)  Aciphex 20 Mg  Tbec (Rabeprazole Sodium) .... Take One Tablet By Mouth Two Times A Day 5)  Aspirin 81 Mg  Tbec (Aspirin) .... Take One Tablet Daily, Holding 6)  Selenium 200 Mcg  Caps (Selenium) .... Take One Tablet Daily 7)  Carafate 1 Gm/2ml  Susp (Sucralfate) .... Take One Teaspoonful By Mouth Three Times A Day Between Meals 8)  Citracal/vitamin D 250-200 Mg-Unit Tabs (Calcium Citrate-Vitamin D) .[name].. 1 By Mouth Once Daily 9)  Magnesium Oxide 250 Mg Tabs (Magnesium Oxide) .[name].. 1 By Mouth Once Daily 10)  Vitamin D3 400 Unit Tabs (Cholecalciferol) 11)  Promethazine Vc/codeine 6.25-5-10 Mg/57ml Syrp (Phenyleph-Promethazine-Cod) .[name].. 1 Tsp Every 6 Hrs As Needed  Allergies (verified): No Known Drug Allergies   Impression & Recommendations:  Problem # 1:  PREOPERATIVE EXAMINATION (ICD-V72.84)  Orders: EKG w/ Interpretation (93000)  Complete Medication List: 1)  Pravastatin Sodium 40 Mg Tabs (Pravastatin sodium) .... Take one tablet by mouth daily 2)  Gabapentin 600 Mg Tabs (Gabapentin) .[name]..  1 by mouth three times a day 3)  Trazodone Hcl 100 Mg Tabs (Trazodone hcl) .... Take one tablet daily 4)  Aciphex 20 Mg Tbec (Rabeprazole sodium) .... Take one tablet by mouth two times a day 5)  Aspirin 81 Mg Tbec (Aspirin) .... Take one tablet daily, holding 6)  Selenium 200 Mcg Caps (Selenium) .... Take one tablet daily 7)  Carafate 1 Gm/102ml Susp (Sucralfate) .... Take one teaspoonful by mouth three times a day between meals 8)  Citracal/vitamin D 250-200 Mg-unit Tabs (Calcium citrate-vitamin d) .[name].. 1 by mouth once daily 9)  Magnesium Oxide 250 Mg Tabs (Magnesium oxide) .[name].. 1 by mouth once daily 10)  Vitamin D3 400 Unit Tabs (Cholecalciferol) 11)  Promethazine Vc/codeine 6.25-5-10 Mg/19ml Syrp (Phenyleph-promethazine-cod) .[name].. 1 tsp every 6 hrs as needed  Patient Instructions: 1)  Please take EKG to your surgeon ; a copy will be FAXed as well.

## 2010-04-02 NOTE — Assessment & Plan Note (Signed)
Summary: discuss issue not discussed at cpx, pneumonia shot///sph   Vital Signs:  Patient profile:   65 year old female Weight:      134.4 pounds BMI:     20.21 Temp:     97.5 degrees F oral Pulse rate:   72 / minute Resp:     16 per minute BP sitting:   102 / 60  (left arm) Cuff size:   regular  Vitals Entered By: Shonna Chock CMA (January 15, 2010 3:01 PM) CC: 1.) Anxiety, Fatigue   Primary Care Provider:  Marga Melnick, MD  CC:  1.) Anxiety and Fatigue.  History of Present Illness: Fatigue      This is a 65 year old woman who presents with Fatigue.  The patient reports persistent fatigue ;primarily physical fatigue.  The patient denies fever, night sweats, weight loss, dyspnea, cough, and hemoptysis.  Other symptoms include intermittent  leg swelling and severe snoring.  She has been intolerant to CPAP Rxed by Dr Annalee Genta.  The patient denies the following symptoms: orthopnea, PND, melena, adenopathy, and skin changes.    Allergies (verified): No Known Drug Allergies  Review of Systems Psych:  Complains of anxiety; denies depression, easily angered, easily tearful, and irritability.  Physical Exam  General:  Thin,well-nourished,in no acute distress; alert,appropriate and cooperative throughout examination Neck:  No deformities, masses, or tenderness noted. Heart:  Normal rate and regular rhythm. S1 and S2 normal without gallop, murmur, click, rub . Neurologic:  alert & oriented X3 and DTRs symmetrical and 1/2 + Cervical Nodes:  No lymphadenopathy noted Axillary Nodes:  No palpable lymphadenopathy Psych:  memory intact for recent and remote, normally interactive, good eye contact, and flat affect.     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 Take one tablet by mouth daily 2)  Gabapentin 600 Mg Tabs (Gabapentin) .[name].. 1 by mouth three times a day 3)  Trazodone Hcl 100 Mg Tabs (Trazodone hcl) .... Take one tablet daily 4)  Aciphex 20 Mg Tbec (Rabeprazole sodium) .... Take one tablet by mouth two times a day 5)  Aspirin 81 Mg Tbec (Aspirin) .... Take one tablet daily, holding 6)  Selenium 200 Mcg Caps (Selenium) .... Take one tablet daily 7)  Carafate 1 Gm/27ml Susp (Sucralfate) .... Take one teaspoonful by mouth three times a day between meals 8)  Citracal/vitamin D 250-200 Mg-unit Tabs (Calcium citrate-vitamin d) .[name].. 1 by mouth once daily 9)  Magnesium Oxide 250 Mg Tabs (Magnesium oxide) .[name].. 1 by mouth once daily 10)  Vitamin D3 400 Unit Tabs (Cholecalciferol) 11)  Cymbalta 60 Mg Cpep (Duloxetine hcl) .[name].. 1 once daily  Other Orders: Pneumococcal Vaccine (11914) Admin 1st Vaccine (78295)  Patient Instructions: 1)  Please schedule a follow-up appointment in 4 months. 2)  CBC w/ Diff, iron panel, folate, B12 prior to visit, ICD-9:285.9. Prescriptions: CYMBALTA 60 MG CPEP (DULOXETINE HCL) 1 once daily  #30 x 2   Entered and Authorized by:   Marga Melnick MD   Signed by:   Marga Melnick MD on 01/15/2010   Method used:   Print then Give to Patient   RxID:   [id number]    Orders Added: 1)  Pneumococcal Vaccine [90732] 2)  Admin 1st Vaccine [90471] 3)  Est. Patient Level III [52841]  Immunizations Administered:  Pneumonia Vaccine:    Vaccine Type: Pneumovax    Site: left deltoid    Mfr: Merck    Dose: 0.5 ml    Route: IM    Given by: Shonna Chock CMA    Exp. Date: 07/03/2011    Lot #: [id number]   Immunizations Administered:  Pneumonia Vaccine:    Vaccine Type: Pneumovax    Site: left deltoid    Mfr: Merck    Dose: 0.5 ml    Route: IM    Given by: Shonna Chock CMA    Exp. Date: 07/03/2011    Lot #: [id number]

## 2010-04-02 NOTE — Assessment & Plan Note (Signed)
Summary: cpx, flu shot---will come in at 1:45 for [facility]//lch   Vital Signs:  Patient profile:   65 year old female Height:      68.5 inches Weight:      136.2 pounds BMI:     20.48 Temp:     97.8 degrees F oral Pulse rate:   72 / minute Resp:     14 per minute BP sitting:   106 / 68  (left arm) Cuff size:   regular  Vitals Entered By: Shonna Chock CMA (December 27, 2009 2:01 PM) CC: CPX and fasting labs , Back pain   Primary Care Nathan Stallworth:  Marga Melnick, MD  CC:  CPX and fasting labs  and Back pain.  History of Present Illness: She had a pre op exam  09/14/2009 for  breast implant bilaterally 10/30/2009. Now  she is having back pain. The patient reports loss of sensation  in her feet due to neuropathy ( diagnosed on EMG/NCT by Neurologist, Dr Lesia Sago) and  occasional urinary incontinence, but denies fever, chills, weakness, fecal incontinence, urinary retention, and dysuria.  The pain is located in the left low back.  The pain began gradually > 1 year ago w/o injury.  The pain does not  radiate.  The pain is made worse by activity such as yardwork.  The pain is made better by NSAID (Aleve)  and heat partially.  Risk factors for serious underlying conditions include duration of pain > 1 month and age >= 50 years. PMH of Fibromyalgia & Spondylosis. She sees Dr Venetia Maxon , NS; he Rxed muscle relaxer.  Her Gyn diagnosed bone thinning  ; no Rx @ present. Vit D level ?[name] Hyperlipidemia Follow-Up      The patient also presents for Hyperlipidemia follow-up.  The patient reports muscle aches and fatigue, but denies GI upset, abdominal pain, constipation, and diarrhea.  Other symptoms include non exertional  chest pain/pressure & occasional  dypsnea, palpitations, and pedal edema.  The patient denies the following symptoms: exercise intolerance and syncope.  Compliance with medications (by patient report) has been near 100%.  Dietary compliance has been good.  The patient reports no exercise.   Adjunctive measures currently used by the patient include ASA.    Current Medications (verified): 1)  Pravastatin Sodium 40 Mg  Tabs (Pravastatin Sodium) .... Take One Tablet By Mouth Daily 2)  Gabapentin 600 Mg Tabs (Gabapentin) .[name].. 1 By Mouth Three Times A Day 3)  Trazodone Hcl 100 Mg  Tabs (Trazodone Hcl) .... Take One Tablet Daily 4)  Aciphex 20 Mg  Tbec (Rabeprazole Sodium) .... Take One Tablet By Mouth Two Times A Day 5)  Aspirin 81 Mg  Tbec (Aspirin) .... Take One Tablet Daily, Holding 6)  Selenium 200 Mcg  Caps (Selenium) .... Take One Tablet Daily 7)  Carafate 1 Gm/29ml  Susp (Sucralfate) .... Take One Teaspoonful By Mouth Three Times A Day Between Meals 8)  Citracal/vitamin D 250-200 Mg-Unit Tabs (Calcium Citrate-Vitamin D) .[name].. 1 By Mouth Once Daily 9)  Magnesium Oxide 250 Mg Tabs (Magnesium Oxide) .[name].. 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 Lungs:  Normal respiratory effort, chest expands symmetrically. Lungs are clear to auscultation, no crackles or wheezes. Heart:  Normal rate and regular rhythm. S1 and S2 normal without gallop, murmur, click, rub . Abdomen:  Bowel sounds positive,abdomen soft and non-tender without masses, organomegaly or hernias noted. 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   Neg SALR  Neurologic:  alert & oriented X3, strength normal in all extremities, and DTRs symmetrical and normal.   Skin:  Intact without suspicious lesions or rashes Cervical Nodes:  No lymphadenopathy noted Axillary Nodes:  No palpable lymphadenopathy Psych:  memory intact for recent and remote, normally interactive, and good eye contact.     Impression & Recommendations:  Problem # 1:  BACK PAIN, CHRONIC (ICD-724.5)  Her updated medication list for this problem includes:    Aspirin 81 Mg Tbec (Aspirin) .[name]... Take one tablet daily, holding  Problem # 2:  PERIPHERAL NEUROPATHY (ICD-356.9)  Orders: Venipuncture (16109) TLB-TSH (Thyroid Stimulating Hormone) (84443-TSH) TLB-B12, Serum-Total ONLY (60454-U98)  Problem # 3:  HYPERLIPIDEMIA (ICD-272.4)  Her updated medication list for this problem includes:    Pravastatin Sodium 40 Mg Tabs (Pravastatin sodium) .[name]... Take one tablet by mouth daily  Orders: Venipuncture (11914) TLB-BMP (Basic Metabolic Panel-BMET) (80048-METABOL) TLB-Hepatic/Liver Function Pnl (80076-HEPATIC) TLB-Lipid Panel (80061-LIPID)  Problem # 4:   OSTEOPENIA (ICD-733.90)  Orders: Venipuncture (78295) T-Vitamin D (25-Hydroxy) (62130-86578) TLB-CBC Platelet - w/Differential (85025-CBCD)  Complete Medication List: 1)  Pravastatin Sodium 40 Mg Tabs (Pravastatin sodium) .... Take one tablet by mouth daily 2)  Gabapentin 600 Mg Tabs (Gabapentin) .[name].. 1 by mouth three times a day 3)  Trazodone Hcl 100 Mg Tabs (Trazodone hcl) .... Take one tablet daily 4)  Aciphex 20 Mg Tbec (Rabeprazole sodium) .... Take one tablet by mouth two times a day 5)  Aspirin 81 Mg Tbec (Aspirin) .... Take one tablet daily, holding 6)  Selenium 200 Mcg Caps (Selenium) .... Take one tablet daily 7)  Carafate 1 Gm/49ml Susp (Sucralfate) .... Take one teaspoonful by mouth three times a day between meals 8)  Citracal/vitamin D 250-200 Mg-unit Tabs (Calcium citrate-vitamin d) .[name].. 1 by mouth once daily 9)  Magnesium Oxide 250 Mg Tabs (Magnesium oxide) .[name].. 1 by mouth once daily 10)  Vitamin D3 400 Unit Tabs (Cholecalciferol)  Patient Instructions: 1)  See Dr Venetia Maxon to re-evaluate the back symptoms. Verify your bone density status. 2)  Avoid foods high in acid (tomatoes, citrus juices, spicy foods). Avoid eating within two hours of lying down or before exercising. Do  not over eat; try smaller more frequent meals. Elevate head of bed twelve inches when sleeping.   Orders Added: 1)  Est. Patient Level IV [40981] 2)  Venipuncture [36415] 3)  TLB-TSH (Thyroid Stimulating Hormone) [84443-TSH] 4)  TLB-B12, Serum-Total ONLY [82607-B12] 5)  T-Vitamin D (25-Hydroxy) [19147-82956] 6)  TLB-CBC Platelet - w/Differential [85025-CBCD] 7)  TLB-BMP (Basic Metabolic Panel-BMET) [80048-METABOL] 8)  TLB-Hepatic/Liver Function Pnl [80076-HEPATIC] 9)  TLB-Lipid Panel [80061-LIPID]

## 2010-04-04 NOTE — Assessment & Plan Note (Signed)
Summary: NOT FEELING ANY BETTER//PH   Vital Signs:  Patient profile:   65 year old female Weight:      136 pounds O2 Sat:      98 % on Room air Temp:     98.4 degrees F oral Pulse rate:   64 / minute Resp:     15 per minute BP sitting:   106 / 72  (left arm) Cuff size:   regular  Vitals Entered By: Shonna Chock CMA (March 20, 2010 3:32 PM)  O2 Flow:  Room air CC: Ongoing concerns-seen 03/13/10, URI symptoms   Primary Care Provider:  Marga Melnick, MD  CC:  Ongoing concerns-seen 03/13/10 and URI symptoms.  History of Present Illness:      This is a 65 year old woman who presents with chest  symptoms.  The patient now  reports dry cough, but denies nasal congestion, purulent nasal discharge, sore throat, and earache.  The patient denies fever, dyspnea, and wheezing.  The patient denies headache , bilateral facial pain, tooth pain, and tender adenopathy after Augmentin X 7 days.  Sharp R scapular pain with cough or deep breathing.    Current Medications (verified): 1)  Pravastatin Sodium 40 Mg  Tabs (Pravastatin Sodium) .... Take One Tablet By Mouth Daily 2)  Gabapentin 600 Mg Tabs (Gabapentin) .[name].. 1 By Mouth Three Times A Day 3)  Trazodone Hcl 100 Mg  Tabs (Trazodone Hcl) .... Take One Tablet Daily 4)  Aciphex 20 Mg  Tbec (Rabeprazole Sodium) .... Take One Tablet By Mouth Two Times A Day 5)  Aspirin 81 Mg  Tbec (Aspirin) .... Take One Tablet Daily 6)  Selenium 200 Mcg  Caps (Selenium) .... Take One Tablet Daily 7)  Carafate 1 Gm/46ml  Susp (Sucralfate) .... Take One Teaspoonful By Mouth Three Times A Day Between Meals...must Have Office Visit 8)  Citracal/vitamin D 250-200 Mg-Unit Tabs (Calcium Citrate-Vitamin D) .[name].. 1 By Mouth Once Daily 9)  Magnesium Oxide 250 Mg Tabs (Magnesium Oxide) .[name].. 1 By Mouth Once Daily 10)  Vitamin D3 400 Unit Tabs (Cholecalciferol) 11)  Amoxicillin-Pot Clavulanate 875-125 Mg Tabs (Amoxicillin-Pot Clavulanate) .[name].. 1 Every 12 Hrs With A Meal 12)   Hydromet 5-1.5 Mg/49ml Syrp (Hydrocodone-Homatropine) .[name].. 1 Tsp Every 6 Hrs As Needed  Allergies (verified): No Known Drug Allergies  Physical Exam  General:  in no acute distress; alert,appropriate and cooperative throughout examination Ears:  External ear exam shows no significant lesions or deformities.  Otoscopic examination reveals clear canals, tympanic membranes are intact bilaterally without bulging, retraction, inflammation or discharge. Hearing is grossly normal bilaterally. Nose:  External nasal examination shows no deformity or inflammation. Nasal mucosa are pink and moist without lesions or exudates. Mouth:  Oral mucosa and oropharynx without lesions or exudates.  Teeth in good repair. Lungs:  Normal respiratory effort, chest expands symmetrically. Lungs are clear to auscultation, no rub, crackles or wheezes but some splinting on R . Heart:  regular rhythm, bradycardia, and grade 1 /6 systolic murmur.   Extremities:  No clubbing, cyanosis, edema. Neg Homan's Cervical Nodes:  No lymphadenopathy noted Axillary Nodes:  No palpable lymphadenopathy   Impression & Recommendations:  Problem # 1:  BRONCHITIS-ACUTE (ICD-466.0)  Her updated medication list for this problem includes:    Amoxicillin-pot Clavulanate 875-125 Mg Tabs (Amoxicillin-pot clavulanate) .[name]... 1 every 12 hrs with a meal    Hydromet 5-1.5 Mg/31ml Syrp (Hydrocodone-homatropine) .[name]... 1 tsp every 6 hrs as needed  Orders: T-2 View CXR (71020TC)  Problem # 2:  PLEURISY (ICD-511.0)  R scapular area  Orders: T-2 View CXR (71020TC)  Complete Medication List: 1)  Pravastatin Sodium 40 Mg Tabs (Pravastatin sodium) .... Take one tablet by mouth daily 2)  Gabapentin 600 Mg Tabs (Gabapentin) .[name].. 1 by mouth three times a day 3)  Trazodone Hcl 100 Mg Tabs (Trazodone hcl) .... Take one tablet daily 4)  Aciphex 20 Mg Tbec (Rabeprazole sodium) .... Take one tablet by mouth two times a day 5)  Aspirin 81 Mg Tbec  (Aspirin) .... Take one tablet daily 6)  Selenium 200 Mcg Caps (Selenium) .... Take one tablet daily 7)  Carafate 1 Gm/89ml Susp (Sucralfate) .... Take one teaspoonful by mouth three times a day between meals...must have office visit 8)  Citracal/vitamin D 250-200 Mg-unit Tabs (Calcium citrate-vitamin d) .[name].. 1 by mouth once daily 9)  Magnesium Oxide 250 Mg Tabs (Magnesium oxide) .[name].. 1 by mouth once daily 10)  Vitamin D3 400 Unit Tabs (Cholecalciferol) 11)  Amoxicillin-pot Clavulanate 875-125 Mg Tabs (Amoxicillin-pot clavulanate) .[name].. 1 every 12 hrs with a meal 12)  Hydromet 5-1.5 Mg/66ml Syrp (Hydrocodone-homatropine) .[name].. 1 tsp every 6 hrs as needed 13)  Hydrocodone-acetaminophen 5-500 Mg Tabs (Hydrocodone-acetaminophen) .[name].. 1 every 4 -6 hrs as needed pain 14)  Avelox 400 Mg Tabs (moxifloxacin Hcl)  .[name].. 1 once daily  Patient Instructions: 1)  CXray @ [facility] office. Prescriptions: AVELOX  400 MG TABS (MOXIFLOXACIN HCL) 1 once daily  #5 x 0   Entered and Authorized by:   Marga Melnick MD   Signed by:   Marga Melnick MD on 03/20/2010   Method used:   Samples Given   RxID:   [id number] HYDROCODONE-ACETAMINOPHEN 5-500 MG TABS (HYDROCODONE-ACETAMINOPHEN) 1 every 4 -6 hrs as needed pain  #30 x 0   Entered and Authorized by:   Marga Melnick MD   Signed by:   Marga Melnick MD on 03/20/2010   Method used:   Print then Give to Patient   RxID:   [id number]    Orders Added: 1)  Est. Patient Level III [84696] 2)  T-2 View CXR [71020TC]

## 2010-04-04 NOTE — Progress Notes (Signed)
Summary: Cymbalta causing side effects  Phone Note Call from Patient Call back at Home Phone [phone]   Caller: Patient Summary of Call: Patient says that Cymbalta is giving her side effects--pt says she does have GERD and acid reflux---says that she is coughing a lot a night and it is affecting her sleep---doesnt cough during the day---thinks it is the Cymbalta      please call her at [phone] Initial call taken by: Jerolyn Shin,  February 21, 2010 9:35 AM  Follow-up for Phone Call        Please advise. Lucious Groves CMA  February 21, 2010 11:02 AM   Additional Follow-up for Phone Call Additional follow up Details #1::        Per MD:  Decrease Cymbalta to 30mg  once daily.  Patient notified of the above and will pick up samples tomorrow.  Additional Follow-up by: Lucious Groves CMA,  February 21, 2010 2:41 PM

## 2010-04-04 NOTE — Letter (Signed)
Summary: [facility]  [facility]   Imported By: Lanelle Bal 03/06/2010 09:09:18  _____________________________________________________________________  External Attachment:    Type:   Image     Comment:   External Document

## 2010-04-04 NOTE — Assessment & Plan Note (Signed)
Summary: CONGESTED/PER NIKKI/KN   Vital Signs:  Patient profile:   65 year old female Weight:      137.2 pounds BMI:     20.63 Temp:     97.8 degrees F oral Pulse rate:   72 / minute Resp:     13 per minute BP sitting:   104 / 70  (left arm) Cuff size:   regular  Vitals Entered By: Shonna Chock CMA (March 13, 2010 1:17 PM) CC: Congestion, ? fever ( couldnt get warm), cough (productive at times and discolored) , URI symptoms   Primary Care Provider:  Marga Melnick, MD  CC:  Congestion, ? fever ( couldnt get warm), cough (productive at times and discolored) , and URI symptoms.  History of Present Illness:    Onset was 01/07 as dry cough followed by rhinitis. The patient  now reports nasal congestion, purulent nasal discharge,  intermittent sore throat, and productive cough with yellow & streaks of blood, but denies earache.  Associated symptoms include low-grade fever (<100.5 degrees).  The patient denies dyspnea and wheezing.  The patient also reports headache.  Risk factors for Strep sinusitis include bilateral facial pain and tender adenopathy.  The patient denies the following risk factors for Strep sinusitis: tooth pain.  Rx: Zinc, Mucus OTC  Current Medications (verified): 1)  Pravastatin Sodium 40 Mg  Tabs (Pravastatin Sodium) .... Take One Tablet By Mouth Daily 2)  Gabapentin 600 Mg Tabs (Gabapentin) .[name].. 1 By Mouth Three Times A Day 3)  Trazodone Hcl 100 Mg  Tabs (Trazodone Hcl) .... Take One Tablet Daily 4)  Aciphex 20 Mg  Tbec (Rabeprazole Sodium) .... Take One Tablet By Mouth Two Times A Day 5)  Aspirin 81 Mg  Tbec (Aspirin) .... Take One Tablet Daily 6)  Selenium 200 Mcg  Caps (Selenium) .... Take One Tablet Daily 7)  Carafate 1 Gm/21ml  Susp (Sucralfate) .... Take One Teaspoonful By Mouth Three Times A Day Between Meals...must Have Office Visit 8)  Citracal/vitamin D 250-200 Mg-Unit Tabs (Calcium Citrate-Vitamin D) .[name].. 1 By Mouth Once Daily 9)  Magnesium Oxide 250 Mg  Tabs (Magnesium Oxide) .[name].. 1 By Mouth Once Daily 10)  Vitamin D3 400 Unit Tabs (Cholecalciferol)  Allergies (verified): No Known Drug Allergies  Physical Exam  General:  in no acute distress; alert,appropriate and cooperative throughout examination Ears:  External ear exam shows no significant lesions or deformities.  Otoscopic examination reveals clear canals, tympanic membranes are intact bilaterally without bulging, retraction, inflammation or discharge. Hearing is grossly normal bilaterally. Nose:  External nasal examination shows no deformity or inflammation. Nasal mucosa are  erythematous without lesions or exudates. Mouth:  Oral mucosa and oropharynx without lesions or exudates.  Teeth in good repair. Slightly hoarse Lungs:  Normal respiratory effort, chest expands symmetrically. Lungs are clear to auscultation, no crackles or wheezes but  dry cough. Cervical Nodes:  Shotty on L Axillary Nodes:  No palpable lymphadenopathy   Impression & Recommendations:  Problem # 1:  BRONCHITIS-ACUTE (ICD-466.0)  Her updated medication list for this problem includes:    Amoxicillin-pot Clavulanate 875-125 Mg Tabs (Amoxicillin-pot clavulanate) .[name]... 1 every 12 hrs with a meal    Hydromet 5-1.5 Mg/23ml Syrp (Hydrocodone-homatropine) .[name]... 1 tsp every 6 hrs as needed  Problem # 2:  URI (ICD-465.9)  Her updated medication list for this problem includes:    Aspirin 81 Mg Tbec (Aspirin) .[name]... Take one tablet daily    Hydromet 5-1.5 Mg/66ml Syrp (Hydrocodone-homatropine) .[name]... 1 tsp  every 6 hrs as needed  Complete Medication List: 1)  Pravastatin Sodium 40 Mg Tabs (Pravastatin sodium) .... Take one tablet by mouth daily 2)  Gabapentin 600 Mg Tabs (Gabapentin) .[name].. 1 by mouth three times a day 3)  Trazodone Hcl 100 Mg Tabs (Trazodone hcl) .... Take one tablet daily 4)  Aciphex 20 Mg Tbec (Rabeprazole sodium) .... Take one tablet by mouth two times a day 5)  Aspirin 81 Mg Tbec (Aspirin) .... Take  one tablet daily 6)  Selenium 200 Mcg Caps (Selenium) .... Take one tablet daily 7)  Carafate 1 Gm/52ml Susp (Sucralfate) .... Take one teaspoonful by mouth three times a day between meals...must have office visit 8)  Citracal/vitamin D 250-200 Mg-unit Tabs (Calcium citrate-vitamin d) .[name].. 1 by mouth once daily 9)  Magnesium Oxide 250 Mg Tabs (Magnesium oxide) .[name].. 1 by mouth once daily 10)  Vitamin D3 400 Unit Tabs (Cholecalciferol) 11)  Amoxicillin-pot Clavulanate 875-125 Mg Tabs (Amoxicillin-pot clavulanate) .[name].. 1 every 12 hrs with a meal 12)  Hydromet 5-1.5 Mg/44ml Syrp (Hydrocodone-homatropine) .[name].. 1 tsp every 6 hrs as needed  Patient Instructions: 1)  Neti pot once daily - two times a day as needed for head congestion. 2)  Drink as much NON dairy  fluid as you can tolerate for the next few days. Prescriptions: HYDROMET 5-1.5 MG/5ML SYRP (HYDROCODONE-HOMATROPINE) 1 tsp every 6 hrs as needed  #120cc x 0   Entered and Authorized by:   Marga Melnick MD   Signed by:   Marga Melnick MD on 03/13/2010   Method used:   Printed then faxed to ...       [facility]* (retail)       [address]       [place], [place]  [address]       Ph: [phone]       Fax: [phone]   RxID:   [id number] AMOXICILLIN-POT CLAVULANATE 875-125 MG TABS (AMOXICILLIN-POT CLAVULANATE) 1 every 12 hrs with a meal  #20 x 0   Entered and Authorized by:   Marga Melnick MD   Signed by:   Marga Melnick MD on 03/13/2010   Method used:   Electronically to        [facility]* (retail)       [address]       [place], [place]  [address]       Ph: [phone]       Fax: [phone]   RxID:   [id number]    Orders Added: 1)  Est. Patient Level III [06301]

## 2010-04-04 NOTE — Progress Notes (Signed)
Summary: Xray Results Request  Phone Note Call from Patient Call back at Home Phone [phone]   Caller: Patient Summary of Call: please call her about her xray results today--call [phone] Initial call taken by: Jerolyn Shin,  March 22, 2010 4:11 PM  Follow-up for Phone Call        Spoke with patient: Very good report; no pneumonia or effusion to suggest inflammation of pleura (tissue lining of lung). Thin body habitus makes lungs look overinflated. Please complete samples of Avelox. Hopp  Follow-up by: Shonna Chock CMA,  March 22, 2010 4:44 PM

## 2010-04-04 NOTE — Letter (Signed)
Summary: [facility]  [facility]   Imported By: Lanelle Bal 02/27/2010 10:22:31  _____________________________________________________________________  External Attachment:    Type:   Image     Comment:   External Document

## 2010-04-16 ENCOUNTER — Encounter: Payer: Self-pay | Admitting: Gastroenterology

## 2010-04-16 ENCOUNTER — Ambulatory Visit (INDEPENDENT_AMBULATORY_CARE_PROVIDER_SITE_OTHER): Payer: 59 | Admitting: Gastroenterology

## 2010-04-16 DIAGNOSIS — R079 Chest pain, unspecified: Secondary | ICD-10-CM

## 2010-04-16 DIAGNOSIS — K219 Gastro-esophageal reflux disease without esophagitis: Secondary | ICD-10-CM

## 2010-04-24 NOTE — Assessment & Plan Note (Addendum)
Summary: GERD Follow up    History of Present Illness Visit Type: Follow-up Visit Primary GI MD: Sheryn Bison MD FACP FAGA Primary Provider: Marga Melnick, MD Chief Complaint: Patient wants to discuss medication and other issues concerning GERD History of Present Illness:   [name] who's had extensive GI and cardiac evaluations for noncardiac chest pain. She is well controlled acid reflux documented by 24-hour pH probe testing as long as she takes AcipHex 20 mg twice a day with p.r.n. Carafate. She denies dysphagia but does have persistent nausea. Cholecystectomy was done many years ago, and there no specific hepatobiliary or lower GI complaints. She does have chronic IBS, depression, migraine headaches, and fibromyalgia. Recently she was started on Cymbalta in addition to trazodone 100 mg at bedtime   GI Review of Systems    Reports acid reflux, chest pain, dysphagia with solids, and  nausea.      Denies abdominal pain, belching, bloating, dysphagia with liquids, heartburn, loss of appetite, vomiting, vomiting blood, weight loss, and  weight gain.      Reports constipation.     Denies anal fissure, black tarry stools, change in bowel habit, diarrhea, diverticulosis, fecal incontinence, heme positive stool, hemorrhoids, irritable bowel syndrome, jaundice, light color stool, liver problems, rectal bleeding, and  rectal pain.    Current Medications (verified): 1)  Pravastatin Sodium 40 Mg  Tabs (Pravastatin Sodium) .... Take One Tablet By Mouth Daily 2)  Trazodone Hcl 100 Mg  Tabs (Trazodone Hcl) .... Take One Tablet Daily 3)  Aciphex 20 Mg  Tbec (Rabeprazole Sodium) .... Take One Tablet By Mouth Two Times A Day 4)  Aspirin 81 Mg  Tbec (Aspirin) .... Take One Tablet Daily 5)  Selenium 200 Mcg  Caps (Selenium) .... Take One Tablet Daily 6)  Carafate 1 Gm/80ml  Susp (Sucralfate) .... Take One Teaspoonful By Mouth Three Times A Day Between Meals...must Have Office  Visit 7)  Citracal/vitamin D 250-200 Mg-Unit Tabs (Calcium Citrate-Vitamin D) .[name].. 1 By Mouth Once Daily 8)  Magnesium Oxide 250 Mg Tabs (Magnesium Oxide) .[name].. 1 By Mouth Once Daily 9)  Vitamin D3 400 Unit Tabs (Cholecalciferol)  Allergies (verified): No Known Drug Allergies  Past History:  Past medical, surgical, family and social histories (including risk factors) reviewed for relevance to current acute and chronic problems.  Past Medical History: Reviewed history from 12/27/2009 and no changes required. 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 Tubal ligation; G2 P 2 Cholecystectomy Vaginal Cystectomy (x2) Hysterectomy & vaginal repair 05/2007 , Dr Nicholas Lose Rotator cuff repair Sinus surgery Esophageal dilation X2, Dr Jarold Motto  Family History: Reviewed history from 12/27/2009 and no changes required. Father: HTN,CVA age 16, MI in 61s, lipids Mother: HTN,PNA, neuropathy due to ? , deceased age 13, lipids Siblings: brother : CAD, stent, elevated  lipids; no FH seizures   Social History: Reviewed history from 12/27/2009 and no changes required. 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   Head:  Normocephalic and atraumatic. Eyes:  PERRLA, no icterus.exam deferred to patient's ophthalmologist.   Lungs:  Clear throughout to auscultation. Heart:  Regular rate and rhythm; no murmurs, rubs,  or bruits. Abdomen:  Soft, nontender and nondistended. No masses, hepatosplenomegaly or hernias noted. Normal bowel sounds. Extremities:  No clubbing, cyanosis, edema or deformities noted. Skin:  Intact without significant lesions or rashes. Cervical Nodes:  No significant cervical adenopathy. Psych:  Alert and cooperative. Normal mood and affect.   Impression & Recommendations:  Problem # 1:  GERD (ICD-530.81) Assessment Improved Continue anti-reflex regime and twice a day PPI therapy. I have urged her to continue calcium and vitamin D supplementation. She complains of coughing which I suspect is related to her sleep apnea. She has appointment for repeat evaluation and possible CPAP usage. I do not think further GI evaluation is indicated at this time. Labs were reviewed and all are unremarkable.  Problem # 2:  CHEST PAIN (ICD-786.50) Assessment: Improved  Problem # 3:  FIBROMYALGIA (ICD-729.1) Assessment: Improved  Problem # 4:  DIVERTICULOSIS OF COLON (ICD-562.10) Assessment: Improved  Patient Instructions: 1)  Copy sent to : Marga Melnick, MD 2)  Mardelle Matte at [facility], [phone]. 3)  The medication list was reviewed and reconciled.  All changed / newly prescribed medications were explained.  A complete medication list was provided to the patient / caregiver. 4)  Avoid foods high in acid content ( tomatoes, citrus juices, spicy foods) .  Avoid eating within 3 to 4 hours of lying down or before exercising. Do not over eat; try smaller more frequent meals. Elevate head of bed four inches when sleeping.  5)  Please continue current medications.  Prescriptions: ACIPHEX 20 MG  TBEC (RABEPRAZOLE SODIUM) take one tablet by mouth two times a day  #60 x 11   Entered by:   Harlow Mares CMA (AAMA)   Authorized by:   Mardella Layman MD [facility]   Signed by:   Harlow Mares CMA (AAMA) on 04/16/2010   Method used:   Print then Give to Patient   RxID:   [id number] CARAFATE 1 GM/10ML  SUSP (SUCRALFATE) take one teaspoonful by mouth three times a day between meals...MUST HAVE OFFICE VISIT  #1 month x 2   Entered by:   Harlow Mares CMA (AAMA)   Authorized by:   Mardella Layman MD [facility]   Signed by:   Harlow Mares CMA (AAMA) on 04/16/2010   Method used:   Print then Give to Patient   RxID:   [id number]

## 2010-04-30 ENCOUNTER — Ambulatory Visit (INDEPENDENT_AMBULATORY_CARE_PROVIDER_SITE_OTHER): Payer: 59 | Admitting: Internal Medicine

## 2010-04-30 ENCOUNTER — Encounter (HOSPITAL_BASED_OUTPATIENT_CLINIC_OR_DEPARTMENT_OTHER): Payer: 59

## 2010-04-30 ENCOUNTER — Encounter: Payer: Self-pay | Admitting: Internal Medicine

## 2010-04-30 ENCOUNTER — Other Ambulatory Visit: Payer: Self-pay | Admitting: Internal Medicine

## 2010-04-30 DIAGNOSIS — D649 Anemia, unspecified: Secondary | ICD-10-CM

## 2010-04-30 DIAGNOSIS — M949 Disorder of cartilage, unspecified: Secondary | ICD-10-CM

## 2010-04-30 DIAGNOSIS — R05 Cough: Secondary | ICD-10-CM

## 2010-05-01 LAB — IBC PANEL
Saturation Ratios: 25 % (ref 20.0–50.0)
Transferrin: 222.5 mg/dL (ref 212.0–360.0)

## 2010-05-01 LAB — CBC WITH DIFFERENTIAL/PLATELET
Basophils Absolute: 0 10*3/uL (ref 0.0–0.1)
HCT: 38 % (ref 36.0–46.0)
Hemoglobin: 13.1 g/dL (ref 12.0–15.0)
Lymphs Abs: 1.8 10*3/uL (ref 0.7–4.0)
MCV: 91.3 fl (ref 78.0–100.0)
Monocytes Absolute: 0.2 10*3/uL (ref 0.1–1.0)
Monocytes Relative: 5 % (ref 3.0–12.0)
Neutro Abs: 2.7 10*3/uL (ref 1.4–7.7)
Platelets: 187 10*3/uL (ref 150.0–400.0)
RDW: 13.9 % (ref 11.5–14.6)

## 2010-05-09 ENCOUNTER — Encounter (INDEPENDENT_AMBULATORY_CARE_PROVIDER_SITE_OTHER): Payer: 59

## 2010-05-09 ENCOUNTER — Encounter: Payer: Self-pay | Admitting: Internal Medicine

## 2010-05-09 DIAGNOSIS — R0609 Other forms of dyspnea: Secondary | ICD-10-CM

## 2010-05-09 DIAGNOSIS — R05 Cough: Secondary | ICD-10-CM

## 2010-05-09 NOTE — Assessment & Plan Note (Signed)
Summary: bad cough--worse at night///sph   Vital Signs:  Patient profile:   65 year old female Weight:      132.8 pounds Temp:     98.3 degrees F oral Pulse rate:   64 / minute Resp:     15 per minute BP sitting:   118 / 76  (left arm) Cuff size:   regular  Vitals Entered By: Shonna Chock CMA (April 30, 2010 2:05 PM) CC: 1.) Cough, worse at night. productive at times (clear)   2.) Lab work   Primary Care Provider:  Marga Melnick, MD  CC:  1.) Cough and worse at night. productive at times (clear)   2.) Lab work.  History of Present Illness:    Recurrent RTIs  for several months; persistent  cough since last bronchitic bout, worse @ night.She now  reports productive cough with clear sputum  and malaise, but denies pleuritic chest pain, shortness of breath, wheezing, fever, and hemoptysis.  The patient denies the following symptoms: chronic rhinitis, acid reflux symptoms, and peripheral edema.  Dr Norval Gable note reviewed ; he feels GERD is controlled. Cough precluded F/U Sleep Study tonight. Remote PMH of RAD?  Allergies (verified): No Known Drug Allergies  Physical Exam  General:  Thin,in no acute distress; alert,appropriate and cooperative throughout examination Ears:  External ear exam shows no significant lesions or deformities.  Otoscopic examination reveals clear canals, tympanic membranes are intact bilaterally without bulging, retraction, inflammation or discharge. Hearing is grossly normal bilaterally. Nose:  External nasal examination shows no deformity or inflammation. Nasal mucosa are pink and moist without lesions or exudates. Mouth:  Oral mucosa and oropharynx without lesions or exudates.  Teeth in good repair. No pharyngeal erythema.   Lungs:  Normal respiratory effort, chest expands symmetrically. Lungs are clear to auscultation, no crackles or wheezes. Heart:  Normal rate and regular rhythm. S1 and S2 normal without gallop, murmur, click, rub. S4 Cervical Nodes:   No lymphadenopathy noted Axillary Nodes:  No palpable lymphadenopathy   Impression & Recommendations:  Problem # 1:  COUGH (ICD-786.2)  Orders: Misc. Referral (Misc. Ref)  Problem # 2:  ANEMIA, MILD (ICD-285.9)  Orders: Venipuncture ([address]) TLB-CBC Platelet - w/Differential (85025-CBCD) TLB-B12 + Folate Pnl (60454_09811-B14/NWG) TLB-IBC Pnl (Iron/FE;Transferrin) (83550-IBC)  Problem # 3:  OSTEOPENIA (ICD-733.90)  Orders: Venipuncture (95621) T-Vitamin D (25-Hydroxy) (30865-78469)  Complete Medication List: 1)  Pravastatin Sodium 40 Mg Tabs (Pravastatin sodium) .... Take one tablet by mouth daily 2)  Trazodone Hcl 100 Mg Tabs (Trazodone hcl) .... Take one tablet daily 3)  Aciphex 20 Mg Tbec (Rabeprazole sodium) .... Take one tablet by mouth two times a day 4)  Aspirin 81 Mg Tbec (Aspirin) .... Take one tablet daily 5)  Selenium 200 Mcg Caps (Selenium) .... Take one tablet daily 6)  Carafate 1 Gm/26ml Susp (Sucralfate) .... Take one teaspoonful by mouth three times a day between meals...must have office visit 7)  Citracal/vitamin D 250-200 Mg-unit Tabs (Calcium citrate-vitamin d) .[name].. 1 by mouth once daily 8)  Magnesium Oxide 250 Mg Tabs (Magnesium oxide) .[name].. 1 by mouth once daily 9)  Vitamin D3 400 Unit Tabs (Cholecalciferol) 10)  Symbicort 80-4.5 Mcg/act Aero (Budesonide-formoterol fumarate) .[name].. 1-2 puffs every 12 hrs ; gargle & spit after use  Patient Instructions: 1)  Use sample spray as directed until 48 hrs prior to PFTs as discussed Prescriptions: SYMBICORT 80-4.5 MCG/ACT AERO (BUDESONIDE-FORMOTEROL FUMARATE) 1-2 puffs every 12 hrs ; gargle & spit after use  #1 x 0  Entered and Authorized by:   Marga Melnick MD   Signed by:   Marga Melnick MD on 04/30/2010   Method used:   Samples Given   RxID:   [id number]    Orders Added: 1)  Est. Patient Level III [14782] 2)  Venipuncture [95621] 3)  TLB-CBC Platelet - w/Differential [85025-CBCD] 4)  TLB-B12 +  Folate Pnl [82746_82607-B12/FOL] 5)  TLB-IBC Pnl (Iron/FE;Transferrin) [83550-IBC] 6)  T-Vitamin D (25-Hydroxy) [30865-78469] 7)  Misc. Referral [Misc. Ref]  Appended Document: bad cough--worse at night///sph Miralax every 3rd night as needed  for constipation  Appended Document: bad cough--worse at night///sph

## 2010-05-10 ENCOUNTER — Encounter: Payer: Self-pay | Admitting: Internal Medicine

## 2010-05-16 ENCOUNTER — Ambulatory Visit (INDEPENDENT_AMBULATORY_CARE_PROVIDER_SITE_OTHER): Payer: 59 | Admitting: Internal Medicine

## 2010-05-16 ENCOUNTER — Encounter: Payer: Self-pay | Admitting: Internal Medicine

## 2010-05-16 DIAGNOSIS — R197 Diarrhea, unspecified: Secondary | ICD-10-CM

## 2010-05-16 DIAGNOSIS — R05 Cough: Secondary | ICD-10-CM

## 2010-05-17 ENCOUNTER — Encounter: Payer: Self-pay | Admitting: Internal Medicine

## 2010-05-17 ENCOUNTER — Other Ambulatory Visit: Payer: Self-pay | Admitting: Internal Medicine

## 2010-05-21 ENCOUNTER — Telehealth: Payer: Self-pay | Admitting: Internal Medicine

## 2010-05-21 ENCOUNTER — Other Ambulatory Visit: Payer: Self-pay | Admitting: Internal Medicine

## 2010-05-21 DIAGNOSIS — R197 Diarrhea, unspecified: Secondary | ICD-10-CM

## 2010-05-21 LAB — STOOL CULTURE

## 2010-05-21 NOTE — Assessment & Plan Note (Signed)
Summary: talk about coughing issues////sph   Vital Signs:  Patient profile:   65 year old female Weight:      133.2 pounds Temp:     98.1 degrees F oral Pulse rate:   76 / minute Resp:     14 per minute BP sitting:   112 / 78  (left arm) Cuff size:   regular  Vitals Entered By: Shonna Chock CMA (May 16, 2010 3:01 PM) CC: 1.) Cough and sneezing   2.) Diarrhea    Primary Care Provider:  Marga Melnick, MD  CC:  1.) Cough and sneezing   2.) Diarrhea .  History of Present Illness:      This is a 65 year old woman who presents with persistent cough since 03/2010 following bronchitis.  The patient denies productive cough, shortness of breath, and wheezing.  The patient denies the following symptoms: cold/URI symptoms, nasal congestion, and acid reflux symptoms.  The cough is worse with lying down.  Risk factors include history of reflux. She is on Acifex twice a day.  Diagnostic testing to date has included spirometry; it was reviewed ( copy had been mailed). She appropriately asks about" reflux asthma".      The patient also presents with Diarrhea.  The patient reports 2-5  stools per day, semiformed/loose stools to watery/unformed stools, voluminous stools, malodorous stools, fecal urgency, fecal soiling, nocturnal diarrhea, and abrupt onset of symptoms, but denies blood in stool, mucus in stool, greasy stools, alternating diarrhea/constipation, bloating, and gassiness.  Associated symptoms include abdominal pain, nausea, and weight loss of 3#.  The patient denies fever, vomiting, lightheadedness, increased thirst, joint pains, mouth ulcers, and eye redness.  The symptoms are not  worse with specific foods.  Patient's risk factors for diarrhea include recent antibiotic use, but not eating suspicious food, eating undercooked meat, eating raw eggs, eating shellfish, international travel, and drinking contaminated water.  Patient has a  history of esophageal stricture x 2, Tics & IBS.  Allergies  (verified): No Known Drug Allergies  Physical Exam  General:  Thin but in no acute distress; alert,appropriate and cooperative throughout examination Eyes:  No corneal or conjunctival inflammation noted.No icterus Mouth:  Oral mucosa and oropharynx without lesions or exudates.  Teeth in good repair.No pharyngeal erythema.  Tongue moist Lungs:  Normal respiratory effort, chest expands symmetrically. Lungs are clear to auscultation, no crackles or wheezes. Heart:  Normal rate and regular rhythm. S1 and S2 normal without gallop, murmur, click, rub or other extra sounds. Abdomen:  Bowel sounds hyperacive,abdomen soft and non-tender without masses, organomegaly or hernias noted. Aorta palpable Skin:  Intact without suspicious lesions or rashes. No jaundice or tenting Cervical Nodes:  No lymphadenopathy noted Axillary Nodes:  No palpable lymphadenopathy Psych:  memory intact for recent and remote, normally interactive, and good eye contact.     Impression & Recommendations:  Problem # 1:  COUGH (ICD-786.2) when supine ; ? 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 Take one tablet by mouth daily 2)  Trazodone Hcl 100 Mg Tabs (Trazodone hcl) .... Take one tablet daily 3)  Aciphex 20 Mg Tbec (Rabeprazole sodium) .... Take one tablet by  mouth two times a day 4)  Aspirin 81 Mg Tbec (Aspirin) .... Take one tablet daily 5)  Selenium 200 Mcg Caps (Selenium) .... Take one tablet daily 6)  Carafate 1 Gm/53ml Susp (Sucralfate) .... Take one teaspoonful by mouth three times a day between meals...must have office visit 7)  Citracal/vitamin D  250-200 Mg-unit Tabs (Calcium citrate-vitamin d) .[name].. 1 by mouth once daily 8)  Magnesium Oxide 250 Mg Tabs (Magnesium oxide) .[name].. 1 by mouth once daily 9)  Vitamin D3 400 Unit Tabs (Cholecalciferol) 10)  Symbicort 80-4.5 Mcg/act Aero (Budesonide-formoterol fumarate) .[name].. 1-2 puffs every 12 hrs ; gargle & spit after use 11)  Clidinium-chlordiazepoxide 2.5-5 Mg Caps (Clidinium-chlordiazepoxide) .[name].. 1 every 6 hrs as needed for bowels  Patient Instructions: 1)  Drink clear liquids only for the next 24 hours, then slowly add other liquids and food as you  tolerate them. Avoid milk ,dairy & grease. Take Align once daily until bowels are normal. Prescriptions: CLIDINIUM-CHLORDIAZEPOXIDE 2.5-5 MG CAPS (CLIDINIUM-CHLORDIAZEPOXIDE) 1 every 6 hrs as needed for bowels  #30 x 1   Entered and Authorized by:   Marga Melnick MD   Signed by:   Marga Melnick MD on 05/16/2010   Method used:   Electronically to        [facility]* (retail)       [address]       [place], [place]  [address]       Ph: [phone]       Fax: [phone]   RxID:   [id number]    Orders Added: 1)  Est. Patient Level IV [84132] 2)  T-Culture, C-Diff Toxin A/B [44010-27253] 3)  T-Stool Giardia / Crypto- EIA [66440] 4)  T-Culture, Stool [87045/87046-70140] 5)  Prescription Created Electronically [phone]

## 2010-05-21 NOTE — Assessment & Plan Note (Signed)
Summary: pft charges   Allergies: No Known Drug Allergies   Other Orders: Carbon Monoxide diffusing w/capacity (94729) Lung Volumes/Gas dilution or washout (94727) Spirometry (Pre & Post) (94060) 

## 2010-05-21 NOTE — Telephone Encounter (Signed)
Patient seen last week has dirrehea said it is getting worse wants to know if there is something else she can take - patient is eating solid foods

## 2010-05-23 ENCOUNTER — Telehealth: Payer: Self-pay | Admitting: Gastroenterology

## 2010-05-23 NOTE — Telephone Encounter (Signed)
Pt reports diarrhea x 5 weeks. She has seen Dr Alwyn Ren who did stools for CDIFF,Giardia and cx which were all negative. She was placed on Librax and Align. Pt took antibiotics in Jan., 2012 for Bronchitis and Pleurisy. Pt reports her stools are thin and watery with little color. She reports abdominal pain below her belly button and the Librax hasn't helped. She started Imodium yesterday and is taking it today per box directions. She is avoiding dairy products and grease. Pt scheduled to see Mike Gip, PA tomorrow and will continue the Imodium and watch her diet while increasing her hydration.

## 2010-05-24 ENCOUNTER — Encounter: Payer: Self-pay | Admitting: Physician Assistant

## 2010-05-24 ENCOUNTER — Ambulatory Visit (INDEPENDENT_AMBULATORY_CARE_PROVIDER_SITE_OTHER): Payer: 59 | Admitting: Physician Assistant

## 2010-05-24 ENCOUNTER — Inpatient Hospital Stay (HOSPITAL_COMMUNITY)
Admission: AD | Admit: 2010-05-24 | Discharge: 2010-05-31 | DRG: 392 | Disposition: A | Discharge status: Home / Self Care | Payer: 59 | Source: Ambulatory Visit | Attending: Gastroenterology | Admitting: Gastroenterology

## 2010-05-24 VITALS — BP 100/68 | HR 84 | Ht 68.0 in | Wt 127.6 lb

## 2010-05-24 DIAGNOSIS — E785 Hyperlipidemia, unspecified: Secondary | ICD-10-CM | POA: Diagnosis present

## 2010-05-24 DIAGNOSIS — R1084 Generalized abdominal pain: Secondary | ICD-10-CM

## 2010-05-24 DIAGNOSIS — A09 Infectious gastroenteritis and colitis, unspecified: Secondary | ICD-10-CM

## 2010-05-24 DIAGNOSIS — K2970 Gastritis, unspecified, without bleeding: Secondary | ICD-10-CM | POA: Insufficient documentation

## 2010-05-24 DIAGNOSIS — E860 Dehydration: Secondary | ICD-10-CM

## 2010-05-24 DIAGNOSIS — F32A Depression, unspecified: Secondary | ICD-10-CM | POA: Insufficient documentation

## 2010-05-24 DIAGNOSIS — K219 Gastro-esophageal reflux disease without esophagitis: Secondary | ICD-10-CM | POA: Insufficient documentation

## 2010-05-24 DIAGNOSIS — K5710 Diverticulosis of small intestine without perforation or abscess without bleeding: Secondary | ICD-10-CM | POA: Diagnosis present

## 2010-05-24 DIAGNOSIS — E869 Volume depletion, unspecified: Secondary | ICD-10-CM | POA: Diagnosis present

## 2010-05-24 DIAGNOSIS — F329 Major depressive disorder, single episode, unspecified: Secondary | ICD-10-CM | POA: Insufficient documentation

## 2010-05-24 DIAGNOSIS — M479 Spondylosis, unspecified: Secondary | ICD-10-CM | POA: Insufficient documentation

## 2010-05-24 DIAGNOSIS — R197 Diarrhea, unspecified: Secondary | ICD-10-CM

## 2010-05-24 DIAGNOSIS — E876 Hypokalemia: Secondary | ICD-10-CM | POA: Diagnosis present

## 2010-05-24 DIAGNOSIS — K5730 Diverticulosis of large intestine without perforation or abscess without bleeding: Secondary | ICD-10-CM | POA: Diagnosis present

## 2010-05-24 DIAGNOSIS — T373X5A Adverse effect of other antiprotozoal drugs, initial encounter: Secondary | ICD-10-CM | POA: Diagnosis present

## 2010-05-24 DIAGNOSIS — R209 Unspecified disturbances of skin sensation: Secondary | ICD-10-CM | POA: Diagnosis present

## 2010-05-24 DIAGNOSIS — K589 Irritable bowel syndrome without diarrhea: Secondary | ICD-10-CM | POA: Insufficient documentation

## 2010-05-24 DIAGNOSIS — IMO0001 Reserved for inherently not codable concepts without codable children: Secondary | ICD-10-CM | POA: Diagnosis present

## 2010-05-24 DIAGNOSIS — R110 Nausea: Secondary | ICD-10-CM | POA: Diagnosis present

## 2010-05-24 LAB — DIFFERENTIAL
Lymphocytes Relative: 30 % (ref 12–46)
Lymphs Abs: 1.5 10*3/uL (ref 0.7–4.0)
Monocytes Absolute: 0.6 10*3/uL (ref 0.1–1.0)
Monocytes Relative: 12 % (ref 3–12)
Neutro Abs: 2.8 10*3/uL (ref 1.7–7.7)
Neutrophils Relative %: 55 % (ref 43–77)

## 2010-05-24 LAB — COMPREHENSIVE METABOLIC PANEL
ALT: 15 U/L (ref 0–35)
AST: 18 U/L (ref 0–37)
CO2: 28 mEq/L (ref 19–32)
Calcium: 8.9 mg/dL (ref 8.4–10.5)
Chloride: 102 mEq/L (ref 96–112)
Creatinine, Ser: 0.54 mg/dL (ref 0.4–1.2)
GFR calc Af Amer: >60 mL/min (ref >60)
GFR calc non Af Amer: >60 mL/min (ref >60)
Glucose, Bld: 91 mg/dL (ref 70–99)
Total Bilirubin: 0.3 mg/dL (ref 0.3–1.2)

## 2010-05-24 LAB — CBC
HCT: 33.2 % — ABNORMAL LOW (ref 36.0–46.0)
Hemoglobin: 11.4 g/dL — ABNORMAL LOW (ref 12.0–15.0)
MCH: 29.8 pg (ref 26.0–34.0)
MCHC: 34.3 g/dL (ref 30.0–36.0)
MCV: 86.9 fL (ref 78.0–100.0)
RBC: 3.82 MIL/uL — ABNORMAL LOW (ref 3.87–5.11)

## 2010-05-24 NOTE — Progress Notes (Signed)
  Subjective:    Patient ID: [name], female    DOB: 01-10-1946, 65 y.o.   MRN: [id number]  HPI Vena Austria is a pleasant 65 year old white female known to Dr. Jarold Motto primary patient of Dr. Alwyn Ren with history of GERD, fibromyalgia ,IBS, diverticulosis and hyperlipidemia. She last underwent colonoscopy in 2001 which was negative except for diverticulosis.  She comes in today referred for persistent diarrhea which had onset approximately 5 weeks ago. She underwent stool cultures per Dr. Alwyn Ren 3/16 which were negative. She says that she feels normal he is very weak lightheaded and dizzy with ambulation and has been at home in bed all week. She has been having chills but no documented fever or sweats. She feels nauseated, has been eating much less than usual and has lost approximately 10 pounds since onset of her symptoms. She relates at least 6-7 bowel movements of liquid stool per day and has been having nocturnal episodes of diarrhea as well. Her husband says she is up to 3 times at night and has been having episodes of fecal incontinence at night as well. Her stool is nonbloody. She complains of fairly generalized abdominal discomfort. She has been tried on a line without any benefit and has been using for Imodium every day.   She did take 2 courses of antibiotics through January and February and her symptoms started at some point after that in February. She has not had anyother known infectious exposures, travel etc. Says that with her IBS she is generally constipated.    Review of Systems  Constitutional: Positive for chills, activity change, appetite change, fatigue and unexpected weight change. Negative for fever.  HENT: Negative.   Eyes: Negative.   Respiratory: Negative.   Cardiovascular: Negative.   Gastrointestinal: Positive for nausea, abdominal pain and diarrhea.  Genitourinary: Negative.   Musculoskeletal: Positive for myalgias and arthralgias.  Skin: Negative.     Neurological: Positive for dizziness and weakness.  Hematological: Negative.   Psychiatric/Behavioral: Negative.    All;NKDA    Objective:   Physical Exam Well developed ill appearing thin white female in no acute distress Alert and oriented x3 HEENT nontraumatic normocephalic EOMI PERRLA sclera anicteric oral mucosa dry  Neck; supple no JVD  Cardiovascular; regular rate and rhythm with S1-S2 no murmur or gallop Pulmonary; clear bilaterally  Abdomen; soft bowel sounds active she is diffusely tender no guarding or rebound no palpable mass or hepatosplenomegaly Rectal exam; not don  Extremities; no edema or cyanosis skin Benign no rash or lesion  Psych; and affect normal an appropriate        Assessment & Plan:   #59 65 year old female with 5 week history of acute diarrheal illness with diffuse abdominal discomfort nausea weight loss and frequent nocturnal diarrhea. Suspected this is an infectious diarrhea and still concerned with the underlying C. Difficile colitis despite negative stool for C. Difficile which was done by toxin and not PCR one week ago.    #2  volume depletion secondary to above    #3 weight loss secondary to above    Plan; Patient will be admitted to [facility] IV fluid rehydration and supportive management.   Will obtain baseline labs and repeat stool studies including stool for C. Difficile by PCR  Start IV metronidazole   Start floor store twice daily   Stop Imodium , will allow Bentyl 3 times a day when necessary for cramping  schedule for CT scan of the abdomen and pelvis.

## 2010-05-24 NOTE — Progress Notes (Signed)
Reviewed and agree with management. Robert Kaplan    

## 2010-05-25 ENCOUNTER — Inpatient Hospital Stay (HOSPITAL_COMMUNITY): Payer: 59

## 2010-05-25 DIAGNOSIS — R933 Abnormal findings on diagnostic imaging of other parts of digestive tract: Secondary | ICD-10-CM

## 2010-05-25 LAB — FECAL LACTOFERRIN, QUANT

## 2010-05-25 LAB — CLOSTRIDIUM DIFFICILE BY PCR: Toxigenic C. Difficile by PCR: NEGATIVE

## 2010-05-25 LAB — BASIC METABOLIC PANEL
Chloride: 101 mEq/L (ref 96–112)
GFR calc Af Amer: >60 mL/min (ref >60)
Potassium: 3.6 mEq/L (ref 3.5–5.1)

## 2010-05-25 LAB — C-REACTIVE PROTEIN: CRP: 1.2 mg/dL — ABNORMAL HIGH (ref <0.6)

## 2010-05-25 MED ORDER — IOHEXOL 300 MG/ML  SOLN
125.0000 mL | Freq: Once | INTRAMUSCULAR | Status: AC | PRN
  Administered 2010-05-25: 125 mL via INTRAVENOUS

## 2010-05-26 LAB — BASIC METABOLIC PANEL
Chloride: 110 mEq/L (ref 96–112)
GFR calc Af Amer: >60 mL/min (ref >60)
Potassium: 3.4 mEq/L — ABNORMAL LOW (ref 3.5–5.1)

## 2010-05-27 DIAGNOSIS — R933 Abnormal findings on diagnostic imaging of other parts of digestive tract: Secondary | ICD-10-CM

## 2010-05-27 DIAGNOSIS — R110 Nausea: Secondary | ICD-10-CM

## 2010-05-27 DIAGNOSIS — R197 Diarrhea, unspecified: Secondary | ICD-10-CM

## 2010-05-27 DIAGNOSIS — E860 Dehydration: Secondary | ICD-10-CM

## 2010-05-27 LAB — BASIC METABOLIC PANEL
CO2: 27 mEq/L (ref 19–32)
Calcium: 8.5 mg/dL (ref 8.4–10.5)
Creatinine, Ser: 0.48 mg/dL (ref 0.4–1.2)
GFR calc Af Amer: >60 mL/min (ref >60)

## 2010-05-28 DIAGNOSIS — R197 Diarrhea, unspecified: Secondary | ICD-10-CM

## 2010-05-28 DIAGNOSIS — E860 Dehydration: Secondary | ICD-10-CM

## 2010-05-28 DIAGNOSIS — R933 Abnormal findings on diagnostic imaging of other parts of digestive tract: Secondary | ICD-10-CM

## 2010-05-28 DIAGNOSIS — R110 Nausea: Secondary | ICD-10-CM

## 2010-05-28 LAB — STOOL CULTURE

## 2010-05-29 LAB — BASIC METABOLIC PANEL
Calcium: 8.7 mg/dL (ref 8.4–10.5)
GFR calc Af Amer: >60 mL/min (ref >60)
GFR calc non Af Amer: >60 mL/min (ref >60)
Glucose, Bld: 130 mg/dL — ABNORMAL HIGH (ref 70–99)
Sodium: 139 mEq/L (ref 135–145)

## 2010-05-30 ENCOUNTER — Encounter (HOSPITAL_BASED_OUTPATIENT_CLINIC_OR_DEPARTMENT_OTHER): Payer: 59

## 2010-05-31 ENCOUNTER — Encounter: Payer: Self-pay | Admitting: Internal Medicine

## 2010-05-31 ENCOUNTER — Other Ambulatory Visit: Payer: Self-pay | Admitting: Internal Medicine

## 2010-05-31 DIAGNOSIS — K5730 Diverticulosis of large intestine without perforation or abscess without bleeding: Secondary | ICD-10-CM

## 2010-06-04 NOTE — Procedures (Signed)
Summary: Colonoscopy  Patient: [name] Note: All result statuses are Final unless otherwise noted.  Tests: (1) Colonoscopy (COL)   COL Colonoscopy           DONE     [facility]     [address] [place], [place]  [address]          COLONOSCOPY PROCEDURE REPORT          PATIENT:  [name], [name]  MR#:  [id number]     BIRTHDATE:  [birth year], 64 yrs. old  GENDER:  female     ENDOSCOPIST:  Wilhemina Bonito. Eda Keys, MD     REF. BY:  Hospital inpatient on GI service     PROCEDURE DATE:  05/31/2010     PROCEDURE:  Colonoscopy with biopsies     ASA CLASS:  Class II     INDICATIONS:  unexplained diarrhea, Abnormal CT of abdomen     MEDICATIONS:   Fentanyl 100 mcg IV, Versed 10 mg IV          DESCRIPTION OF PROCEDURE:   After the risks benefits and     alternatives of the procedure were thoroughly explained, informed     consent was obtained.  Digital rectal exam was performed and     revealed no abnormalities.   The Pentax Colonoscope [id number]     endoscope was introduced through the anus and advanced to the     cecum, which was identified by both the appendix and ileocecal     valve, without limitations.  The quality of the prep was     excellent, using MoviPrep.  The instrument was then slowly     withdrawn as the colon was fully examined.     <<PROCEDUREIMAGES>>          FINDINGS:  The terminal ileum appeared normal.  Moderate     diverticulosis was found in the sigmoid colon.  Otherwise normal     colonoscopy without other polyps, masses, vascular ectasias, or     inflammatory changes.Random colon bx taken.   Retroflexed views in     the rectum revealed no abnormalities.    The scope was then     withdrawn from the patient and the procedure completed.          COMPLICATIONS:  None          ENDOSCOPIC IMPRESSION:     1) Normal terminal ileum     2) Moderate diverticulosis in the sigmoid colon     3) Otherwise normal colonoscopy       RECOMMENDATIONS:     1) Ok for discharge home later today     2) Follow up biopsies     3) GI follow up with Dr. Jarold Motto 06-14-10 @ 11 am          ______________________________     Wilhemina Bonito. Eda Keys, MD          CC:  Sheryn Bison, MD; Pecola Lawless, MD; The Patient          n.     eSIGNED:   Wilhemina Bonito. Eda Keys at 05/31/2010 10:30 AM          [name], Ewing, [id number]  Note: An exclamation mark (!) indicates a result that was not dispersed into the flowsheet. Document Creation Date: 05/31/2010 10:31 AM _______________________________________________________________________  (1) Order result status: Final Collection or observation date-time: 05/31/2010 10:23 Requested date-time:  Receipt date-time:  Reported date-time:  Referring Physician:   Ordering Physician: Fransico Setters [id number]) Specimen Source:  Source: Launa Grill Order Number: [id number] Lab site:

## 2010-06-07 LAB — POCT CARDIAC MARKERS: CKMB, poc: <1 ng/mL — ABNORMAL LOW (ref 1.0–8.0)

## 2010-06-07 LAB — POCT I-STAT, CHEM 8
Glucose, Bld: 94 mg/dL (ref 70–99)
HCT: 37 % (ref 36.0–46.0)
Hemoglobin: 12.6 g/dL (ref 12.0–15.0)
Potassium: 3.8 mEq/L (ref 3.5–5.1)

## 2010-06-07 NOTE — Discharge Summary (Signed)
NAME:  [name], [name]     ACCOUNT NO.:  [id number]  MEDICAL RECORD NO.:  [id number]           PATIENT TYPE:  I  LOCATION:  1502                         FACILITY:  [facility]  PHYSICIAN:  Wilhemina Bonito. Marina Goodell, MD      DATE OF BIRTH:  1945/03/21  DATE OF ADMISSION:  05/24/2010 DATE OF DISCHARGE:  05/31/2010                              DISCHARGE SUMMARY   ADMITTING DIAGNOSES: 28. A 65 year old female with acute severe diarrheal illness x5 weeks     associated with diffuse abdominal pain, nausea, weight loss, and     frequent nocturnal diarrhea.  Suspect infectious etiology, possible     underlying Clostridium difficile despite negative stool for     Clostridium difficile by toxin.  Also cannot rule out underlying     inflammatory bowel disease or microscopic colitis. 2. Volume depletion secondary to above. 3. Weight loss secondary to above. 4. History of fibromyalgia, gastroesophageal reflux disease, and     hyperlipidemia.  DISCHARGE DIAGNOSES: 1. Resolving/improved acute diarrheal illness of unclear etiology.     Suspect resolving infectious diarrhea.  Biopsies pending to rule     out microscopic colitis. 2. Hypokalemia secondary to above, corrected. 3. Nausea secondary to above, resolved. 4. Intolerance to metronidazole with nausea, headache, and     paresthesias. 5. Large duodenal diverticulum. 6. Other diagnoses as listed above.  CONSULTATIONS:  None.  PROCEDURES:  CT scan of the abdomen and pelvis done on March 24, showing a 1.5 x 0.6 cm left hepatic lobe lesion anteriorly, felt likely hemangioma, mild prominence of the intra and extrahepatic bile ducts, common bile duct 10 mm.  She had a large duodenal diverticulum and air- fluid level.  CT also showed normal appearing terminal ileum and cecum with irregular wall thickening of the ascending colon with adjacent stranding.  No other inflammatory changes of the colon noted.  Status post cholecystectomy.  LABORATORY  STUDIES:  On admission, March 23, WBC of 5.1, hemoglobin 11.4, hematocrit of 33.2, potassium 2.6, BUN 6, creatinine 0.54.  Liver function study was within normal limits.  CRP was 1.2.  Stool for C diff by PCR negative.  Stool culture negative.  Stool lactoferrin positive. Followup labs on March 28 showed potassium of 3.2, sed rate of 8.  HOSPITAL COURSE:  The patient was admitted to the service of Dr. Christella Hartigan who was covering the hospital.  She was initially placed on IV fluid rehydration, given potassium replacement, covered empirically with IV Flagyl and started on IV antiemetics with Zofran.  She was started on Florastor and had stool cultures obtained.  She underwent CT scan of the abdomen and pelvis on the 24th with findings as outlined above.  She continued to have a significant amount of abdominal discomfort, diarrhea, and nausea through the first few days of her hospitalization. She remained hemodynamically stable and afebrile.  Her nausea actually worsened rather than improved and it was felt that this was likely due to the IV Flagyl.  By the 27th, she was also having paresthesias and we discontinued the IV Flagyl.  At that point, her abdominal pain was actually better and her diarrhea was lessening, though still having 3 to 4  liquid stools per day on Imodium.  All of her cultures returned negative.  We were gradually able to advance her diet.  Her strength improved as did her appetite.  Decision was made to proceed with colonoscopy for diagnostic purposes.  She had had a negative colonoscopy in 2006 but because of the prolonged nature of this illness and persistence with antidiarrheals, intolerance to Flagyl, etc., it was felt that it would help with management.  She underwent the colonoscopy on March 30.  She tolerated the prep well.  Colonoscopy was negative with the exception of some left-sided diverticulosis.  There was no evidence of inflammatory changes in the right side of  her colon.  Random biopsies were taken and are pending at the time of this discharge summary to rule out an underlying microscopic colitis.  Plan is for discharge home later this afternoon with followup with Dr. Jarold Motto in the office on June 14, 2010.  She will be maintained on probiotic Florastor twice daily.  She will remain on dicyclomine 10 mg twice daily as needed for cramping and Imodium 2 p.o. twice daily as needed for diarrhea.  Other home meds will be continued including Cymbalta 60 mg daily, Pravachol 40 mg daily, trazodone 100 mg daily, Nexium 40 mg daily.  CONDITION ON DISCHARGE:  Stable and improved.     Amy Esterwood, PA-C   ______________________________ Wilhemina Bonito. Marina Goodell, MD    AE/MEDQ  D:  05/31/2010  T:  05/31/2010  Job:  [id number]  Electronically Signed by AMY ESTERWOOD PA-C on 06/03/2010 02:19:54 PM Electronically Signed by Yancey Flemings MD on 06/07/2010 10:07:27 AM

## 2010-06-14 ENCOUNTER — Ambulatory Visit (INDEPENDENT_AMBULATORY_CARE_PROVIDER_SITE_OTHER): Payer: 59 | Admitting: Gastroenterology

## 2010-06-14 ENCOUNTER — Ambulatory Visit: Payer: 59 | Admitting: Gastroenterology

## 2010-06-14 ENCOUNTER — Encounter: Payer: Self-pay | Admitting: Gastroenterology

## 2010-06-14 VITALS — BP 100/70 | HR 80 | Ht 68.5 in | Wt 129.0 lb

## 2010-06-14 DIAGNOSIS — K5289 Other specified noninfective gastroenteritis and colitis: Secondary | ICD-10-CM

## 2010-06-14 DIAGNOSIS — K589 Irritable bowel syndrome without diarrhea: Secondary | ICD-10-CM

## 2010-06-14 DIAGNOSIS — K52832 Lymphocytic colitis: Secondary | ICD-10-CM

## 2010-06-14 MED ORDER — BUDESONIDE 3 MG PO CP24: ORAL_CAPSULE | ORAL | Status: DC

## 2010-06-14 MED ORDER — ESOMEPRAZOLE MAGNESIUM 40 MG PO CPDR: 40.0000 mg | DELAYED_RELEASE_CAPSULE | Freq: Every day | ORAL | Status: DC

## 2010-06-14 NOTE — Progress Notes (Signed)
This is a  [name] a 65 year old white female with past history of irritable bowel syndrome. She was admitted on March 23 with acute diarrhea and dehydration. Extensive workup was unremarkable including colonoscopy. Our, mucosal biopsies obtained by Dr. Yancey Flemings that suggested possible lymphocytic colitis. The patient continues with some vague abdominal cramping and periodic diarrhea. Additional findings on CT scan were a duodenal diverticulum. She had paresthesias from metronidazole which was administered intravenously. Reviewed her hospitalization detail with her today, as she is somewhat agitated about her diagnoses of fibromyalgia. Vascular to speak with her primary care physician concerning this issue. She currently is improved and denies rectal bleeding, nausea vomiting, or any systemic complaints.  Current Medications, Allergies, Past Medical History, Past Surgical History, Family History and Social History were reviewed in [employer] record.  Pertinent Review of Systems Negative   Physical Exam: Awake and alert no acute distress. No stigmata of chronic liver disease him a skin rash, joint pains, or stomatitis. Abdominal exam is entirely benign without distention, organomegaly, masses or tenderness. Bowel sounds are normal.    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 I have started 9 mg a day for 2 weeks to be tapered to 2 mg a day for 2 weeks with office followup in one month. I've also recommended low fiber diet when necessary Librax for spasms.  Encounter Diagnosis  Name Primary?  . Other and unspecified noninfectious gastroenteritis and colitis Yes   Please copy her primary care physician, referring physician, and pertinent subspecialists.

## 2010-06-14 NOTE — Patient Instructions (Signed)
You were given a low fiber diet today in the office. Entocort and Nexium were sent to your pharmacy. Make an appt for come back and see Dr. Jarold Motto in one month.

## 2010-06-18 ENCOUNTER — Ambulatory Visit: Payer: 59 | Admitting: Gastroenterology

## 2010-06-20 ENCOUNTER — Telehealth: Payer: Self-pay | Admitting: Gastroenterology

## 2010-06-20 MED ORDER — CILIDINIUM-CHLORDIAZEPOXIDE 2.5-5 MG PO CAPS: 1.0000 | ORAL_CAPSULE | Freq: Four times a day (QID) | ORAL | Status: DC | PRN

## 2010-06-20 NOTE — Telephone Encounter (Signed)
Pt saw Mike Gip, PA on 05/24/10 and was admitted to [facility]. She had a f/u with DR Jarold Motto on 06/14/10 for her Infectious Colitis. Dr Jarold Motto ordered a tapering dose of Entocort , she was to follow a low fiber diet and use Librax prn for spasms. Today pt reports she hurts so bad in the bottom of her stomach and the pain radiates to her back. The pain is so bad she has to lie down most of the time and use a heating pad. Pt denies diarrhea states her stools are usually a small amount, formed or pieces- she does not feel as though she's constipated. She denies blood in her stool. She feels as though she may have a temp because she has chills. Pt is following a low fiber diet, but Librax was never ordered. Ordered Librax to [place] and pt is to call tomorrow to report if her pain/spasms are improving with the med.

## 2010-07-04 ENCOUNTER — Telehealth: Payer: Self-pay | Admitting: Gastroenterology

## 2010-07-04 NOTE — Telephone Encounter (Signed)
Patient calling because she does not feel she is making progress in getting better. She is taking Entocort as ordered. Stool is  Soft,formed, pebble-like with pus/mucous in it. States she feels like she has to go to the bathroom but does not have much stool. Stomach hurts in lower abdomen all the way to her "privates", rectal pressure and back pain also. She is taking Librax also. Hx IBS, admitted 05/24/10 for possible lymphocytic colitis. Please, advise.

## 2010-07-05 NOTE — Telephone Encounter (Signed)
Spoke with pt to inform her Dr Jarold Motto would like to see her about her problems. Pt has an appt on 07/16/10 at 2:30 and I will call her if an earlier is found. Pt states she has a lot of pressure and pain int he bottom of her stomach, side, back and her :private area". Her BM's are no longer uncontrollable, but she still has urgency. She reports a bad day yesterday , went to BR 8-9 times for urgency and usually no stool, just mucus. She rested with the heating pad on her abdomen, took Librax and finally got some relief. Reinforced to pt she can take the Librax q6hrs and she stated she only takes it when she has to d/t the side effects. She has had only a couple of solid stools; most of the time she has little pebbles or just mucus. Pt asked about her diet and was instructed to advance as tolerated, eliminate foods that cause problems, avoid seeds. Pt will call for worsening or questions.

## 2010-07-05 NOTE — Telephone Encounter (Signed)
Ov needeed

## 2010-07-08 ENCOUNTER — Telehealth: Payer: Self-pay | Admitting: *Deleted

## 2010-07-08 NOTE — Telephone Encounter (Signed)
Spoke with pt and she will come in tomorrow to see Dr Jarold Motto.

## 2010-07-08 NOTE — Telephone Encounter (Signed)
LMOM for pt to call back. Dr Jarold Motto has a cancellation in am and I wondered if she wants to take it.

## 2010-07-09 ENCOUNTER — Other Ambulatory Visit (INDEPENDENT_AMBULATORY_CARE_PROVIDER_SITE_OTHER): Payer: 59

## 2010-07-09 ENCOUNTER — Encounter: Payer: Self-pay | Admitting: Gastroenterology

## 2010-07-09 ENCOUNTER — Ambulatory Visit (INDEPENDENT_AMBULATORY_CARE_PROVIDER_SITE_OTHER): Payer: 59 | Admitting: Gastroenterology

## 2010-07-09 DIAGNOSIS — K589 Irritable bowel syndrome without diarrhea: Secondary | ICD-10-CM

## 2010-07-09 DIAGNOSIS — K219 Gastro-esophageal reflux disease without esophagitis: Secondary | ICD-10-CM

## 2010-07-09 DIAGNOSIS — R197 Diarrhea, unspecified: Secondary | ICD-10-CM | POA: Insufficient documentation

## 2010-07-09 LAB — HEPATIC FUNCTION PANEL
ALT: 14 U/L (ref 0–35)
AST: 19 U/L (ref 0–37)
Albumin: 3.7 g/dL (ref 3.5–5.2)
Total Protein: 6.2 g/dL (ref 6.0–8.3)

## 2010-07-09 LAB — CBC WITH DIFFERENTIAL/PLATELET
Eosinophils Relative: 0.8 % (ref 0.0–5.0)
Lymphocytes Relative: 15.1 % (ref 12.0–46.0)
Monocytes Relative: 5 % (ref 3.0–12.0)
Neutrophils Relative %: 78.7 % — ABNORMAL HIGH (ref 43.0–77.0)
Platelets: 294 10*3/uL (ref 150.0–400.0)
WBC: 8.7 10*3/uL (ref 4.5–10.5)

## 2010-07-09 LAB — FOLATE: Folate: 15.3 ng/mL (ref >5.9)

## 2010-07-09 LAB — SEDIMENTATION RATE: Sed Rate: 20 mm/hr (ref 0–22)

## 2010-07-09 LAB — IBC PANEL: Transferrin: 223.7 mg/dL (ref 212.0–360.0)

## 2010-07-09 LAB — BASIC METABOLIC PANEL
BUN: 10 mg/dL (ref 6–23)
CO2: 30 mEq/L (ref 19–32)
Chloride: 103 mEq/L (ref 96–112)
Creatinine, Ser: 0.6 mg/dL (ref 0.4–1.2)

## 2010-07-09 LAB — FERRITIN: Ferritin: 78.1 ng/mL (ref 10.0–291.0)

## 2010-07-09 LAB — IGA: IgA: 124 mg/dL (ref 68–378)

## 2010-07-09 LAB — TSH: TSH: 2.52 u[IU]/mL (ref 0.35–5.50)

## 2010-07-09 NOTE — Progress Notes (Signed)
This is a [name] recently admitted because of intractable diarrhea. Colonoscopy was normal but she had mucosal biopsy suggestive of microscopic colitis. However, treatment with Entocort has only made minimal improvement in her symptomatology. She is also on Librax 3 times a day but continues with rectal spasm, lower abdominal discomfort, and passage of excessive mucus. She also describes constant regurgitation, sore throat, coughing, and phlegm production in her throat despite soaker frayed and Nexium therapy. There is no history of dysphagia, hepatobiliary complaints, melena or hematochezia. She has general malaise, fatigue, decreased sense of well being. She does take 100 mg of trazodone at bedtime. She has been on numerous antibiotics over the last year for various reasons. She continues to deny the use of sorbitol, fructose, or lactose.  Current Medications, Allergies, Past Medical History, Past Surgical History, Family History and Social History were reviewed in [employer] record.  Pertinent Review of Systems Negative... no specific cardiopulmonary, genitourinary, neurologic, orthopedic, dermatologic problems at this time.   Physical Exam: Awake and alert no acute distress. There no stigmata of chronic liver disease. Her chest is clear and she is in a regular rhythm without murmurs gallops or rubs. Her abdomen shows no distention, organomegaly, masses or tenderness. Bowel sounds are normal.  Extremities are unremarkable and her mental status is normal. Rectal exam is deferred at this time.    Assessment and Plan: Unusual symptom complex in a patient with a long history of IBS. I have decided to repeat her flexible sigmoidoscopy with colon biopsies, and also repeat her endoscopic exam with esophageal biopsies. Review of her labs from hospitalization shows various metabolic abnormalities, and I will repeat her lab tests. It sounds like she has possible  proctitis, and I wonder if she has underlying inflammatory bowel disease accounting for numerous different complaints. Encounter Diagnoses  Name Primary?  . IBS (irritable bowel syndrome)   . Esophageal reflux   . Diarrhea

## 2010-07-09 NOTE — Patient Instructions (Signed)
Your procedure has been scheduled for 07/10/2010, please follow the seperate instructions.  Please go to the basement today for your labs.

## 2010-07-10 ENCOUNTER — Encounter: Payer: Self-pay | Admitting: Gastroenterology

## 2010-07-10 ENCOUNTER — Ambulatory Visit (AMBULATORY_SURGERY_CENTER): Payer: 59 | Admitting: Gastroenterology

## 2010-07-10 DIAGNOSIS — R197 Diarrhea, unspecified: Secondary | ICD-10-CM

## 2010-07-10 DIAGNOSIS — R079 Chest pain, unspecified: Secondary | ICD-10-CM

## 2010-07-10 DIAGNOSIS — K5289 Other specified noninfective gastroenteritis and colitis: Secondary | ICD-10-CM

## 2010-07-10 DIAGNOSIS — K319 Disease of stomach and duodenum, unspecified: Secondary | ICD-10-CM

## 2010-07-10 DIAGNOSIS — K5730 Diverticulosis of large intestine without perforation or abscess without bleeding: Secondary | ICD-10-CM

## 2010-07-10 DIAGNOSIS — K921 Melena: Secondary | ICD-10-CM

## 2010-07-10 DIAGNOSIS — K2970 Gastritis, unspecified, without bleeding: Secondary | ICD-10-CM

## 2010-07-10 DIAGNOSIS — K529 Noninfective gastroenteritis and colitis, unspecified: Secondary | ICD-10-CM

## 2010-07-10 DIAGNOSIS — K219 Gastro-esophageal reflux disease without esophagitis: Secondary | ICD-10-CM

## 2010-07-10 LAB — GLIA (IGA/G) + TTG IGA: Tissue Transglutaminase Ab, IgA: 3.3 U/mL (ref <20)

## 2010-07-10 MED ORDER — SODIUM CHLORIDE 0.9 % IV SOLN: 500.0000 mL | INTRAVENOUS | Status: DC

## 2010-07-10 MED ORDER — MESALAMINE 1.2 G PO TBEC: DELAYED_RELEASE_TABLET | ORAL | Status: DC

## 2010-07-10 MED ORDER — ESOMEPRAZOLE MAGNESIUM 40 MG PO CPDR: 40.0000 mg | DELAYED_RELEASE_CAPSULE | Freq: Two times a day (BID) | ORAL | Status: DC

## 2010-07-10 MED ORDER — PREDNISONE (PAK) 10 MG PO TABS: 20.0000 mg | ORAL_TABLET | Freq: Every day | ORAL | Status: AC

## 2010-07-10 NOTE — Patient Instructions (Signed)
Please read over discharge instructions given. Handouts given for diverticulosis,colitis,hemorroids,hiatal hernia,and gastritis. Biopsies taken today, you will receive result letter in your mail in 1-2 weeks. Follow up with Dr.Patterson in 2 weeks, call office [phone]) to make appointment. Continue current medications, increase nexium to 40mg  twice a day. New medications sent to your pharmacy: Prednisone 20mg  daily,&  Lialda 2.4gm daily(samples given). Stop entocort!

## 2010-07-11 ENCOUNTER — Telehealth: Payer: Self-pay | Admitting: *Deleted

## 2010-07-11 NOTE — Telephone Encounter (Signed)
Follow up Call- Patient questions:  Do you have a fever, pain , or abdominal swelling? no Pain Score  0 *  Have you tolerated food without any problems? yes  Have you been able to return to your normal activities? yes  Do you have any questions about your discharge instructions: Diet   no Medications  yes Follow up visit  no  Do you have questions or concerns about your Care? no  Actions: * If pain score is 4 or above: No action needed, pain <4. Pt. Had question about lialda and how to take it. Reviewed instructions on how to take pt. Verbalize understanding. Pt. Then had questions about nexium [place] rx. There and then called pt. Back and informed her to pick rx. Up for nexium.

## 2010-07-16 ENCOUNTER — Ambulatory Visit: Payer: 59 | Admitting: Gastroenterology

## 2010-07-16 NOTE — Procedures (Signed)
[facility]                                PROCEDURE NOTE   NAME:[name]                MRN:          [id number]  DATE:03/16/2007                            DOB:          Aug 17, 1945    24-HOUR PH PROBE TEST:  The 24-hour pH probe test was started and  completed on January 13 to March 17, 2007.  This manometry shows no  evidence of acid reflux in upright or supine position in the proximal  and distal catheters.  Total DeMeester score is 3.4, normal being less  than 22.  There is also no evidence of any positive symptom index  recorded by the patient.  It is of note this patient is on twice-a-day  Aciphex therapy for this procedure.   ASSESSMENT:  This is a normal esophageal manometry.  The 24-hour pH  probe test shows good acid control without any significant acid reflux  on twice-a-day PPI therapy.  This patient should be responding well to  medical management.  If she does not respond well, there is certainly no  contraindication on this exam to fundoplication surgery.     Vania Rea. Jarold Motto, MD, Caleen Essex, FAGA  Electronically Signed    DRP/MedQ  DD: 03/30/2007  DT: 03/31/2007  Job #: [id number]   cc:   Adolph Pollack, M.D.

## 2010-07-16 NOTE — Assessment & Plan Note (Signed)
[facility]                         GASTROENTEROLOGY OFFICE NOTE   NAME:[name]              MRN:          [id number]  DATE:11/03/2006                            DOB:          [birth year]    HISTORY OF PRESENT ILLNESS:  [name] has had esophageal spasm and a globus  sensation since stopping her Aciphex and going on generic Protonix.  She  really denies dysphagia or any cardiovascular or pulmonary or  hepatobiliary complaints.  She is status post cholecystectomy.  Last  ultrasound was in February 2007.  Last endoscopic exam was in December  2006.   She is on a variety of medications including gabapentin 300 mg t.i.d.,  Detrol LA 4 mg daily, trazodone 100 mg at bedtime and Pravastatin 40 mg  daily.   She weighs 128 pounds, blood pressure 118/57, pulse 84 and regular.     Heather Rea. Jarold Motto, MD, Caleen Essex, FAGA  Electronically Signed    DRP/MedQ  DD: 11/03/2006  DT: 11/03/2006  Job #: [id number]

## 2010-07-16 NOTE — Assessment & Plan Note (Signed)
[facility]                         GASTROENTEROLOGY OFFICE NOTE   NAME:WEISNER-CHRISMONReginia [name]              MRN:          [id number]  DATE:02/09/2007                            DOB:          [birth year]    [name] underwent endoscopic exam on December 30, 2006.  This was fairly  unremarkable, including multiple esophageal biopsies.  She did have a 5  cm hiatal hernia.  She most likely had acid reflux.  Biopsy for H.  pylori also was negative.   She currently is taking AcipHex 20 mg twice a day along with Carafate  suspension after meals and at bedtime, and Reglan 5 mg before meals and  at bedtime.  She was having good symptomatic improvement.  She denies  dysphagia, but continues to have periodic episodes of substernal chest  pain.  She denies any hepatobiliary complaints and is status post  cholecystectomy.   She is additionally on:  1. Trazodone 100 mg at bedtime.  2. Pravastatin 40 mg at bedtime.  3. Requip 1 mg at bedtime.  4. A variety of multivitamin and calcium supplementations.  5. Gabapentin 300 mg t.i.d.   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 Marga Melnick.   In any case, [name] is scheduled for outpatient 24 hour pH probe  monitoring monometry in January.  I will have her off of her medications  3 days before this procedure.  I suspect that she does have chronic  gastroesophageal reflux disease with afferent pain modification  difficulties.  She does have a history of non-cardiac chest pain and has  had previous negative cardiac evaluations.  I will continue her current  medical regimen and follow up on her monometry and pH probe testing in a  couple of months.  I will see her on a p.r.n. basis in the interim and  continue all medications listed above.     Vania Rea. Jarold Motto, MD, Caleen Essex, FAGA  Electronically Signed    DRP/MedQ   DD: 02/09/2007  DT: 02/10/2007  Job #: [id number]   cc:   Titus Dubin. Alwyn Ren, MD,FACP,FCCP

## 2010-07-16 NOTE — H&P (Signed)
NAME:  [name], [name] NO.:  [id number]   MEDICAL RECORD NO.:  [id number]          PATIENT TYPE:  INP   LOCATION:                               FACILITY:  [facility]   PHYSICIAN:  Gretta Cool, M.D. DATE OF BIRTH:  [birth year]   DATE OF ADMISSION:  05/17/2007  DATE OF DISCHARGE:                              HISTORY & PHYSICAL   CHIEF COMPLAINT:  Pelvic support problems.   HISTORY OF PRESENT ILLNESS:  A 65 year old G2, P2 with a well-known  global pelvic support problems.  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  She  has had progressive weakening over time.  She also has overactive  bladder, reasonably well-controlled on Detrol, denies significant  difficulties with stress incontinence.  She is on no hormone replacement  therapy.  She has intermittently used vaginal estrogen.  I have stressed  the importance of avoiding straining at stool and correcting her  constipation prior to repair.  She is now admitted for definitive  therapy by vaginal hysterectomy.   PAST MEDICAL HISTORY:  Usual childhood diseases without sequelae.   MEDICAL ILLNESSES:  1. The patient has cholesterol elevation on Pravachol managed by Dr.      Alwyn Ren.  2. She has a history of gallstones.  3. A history of abnormal bleeding and D&C.  4. History of tubal sterilization.  5. History of sinus surgery.   PRESENT MEDICATIONS:  1. Estrogen vaginal cream.  2. Pravachol 20 mg daily.  3. Trazodone 100 mg one and a half  tablet for sleep.  4. Neurontin 300 mg for neuropathy.  5. She uses aspirin 81 mg daily.  6. Detrol intermittently overactive bladder.   FAMILY HISTORY:  Father died of a stroke, had a history of coronary  disease and bypass procedure.  Mom has blood pressure elevation and  severe osteoporosis.  One brother has high cholesterol.   HABITS:  Denies ethanol or tobacco.   OBSTETRICAL HISTORY:  She had two vaginal  deliveries, both approximately  5-1/2 pounds, first was breech, delivered by forceps.   REVIEW OF SYSTEMS:  HEENT:  Denies symptoms.  CARDIORESPIRATORY:  Denies asthma, cough, bronchitis or shortness of  breath.  GI/GU: Denies frequency but has urgency and occasional overactive  bladder, improved on Detrol.  Denies significant stress component.  She  has global pelvic organ prolapse as previously noted.   PHYSICAL EXAMINATION:  GENERAL:  Well-developed, well-nourished, thin,  white female.  VITAL SIGNS:  Weight 133 pounds, 5 feet 8-3/4 inches, BMI 20.  HEENT:  Pupils equal and reactive to light and accommodation.  Fundi not  examined.  Oropharynx clear.  NECK:  Supple without mass or thyroid enlargement.  CHEST:  Clear to percussion and auscultation.  BREASTS:  Bilateral implants without mass or nodes or nipple discharge.  HEART:  Regular rhythm without murmur or cardiac enlargement.  ABDOMEN:  Soft, scaphoid, without mass, organomegaly.  PELVIC:  External genitalia normal female, except widening of the  genital hiatus.  She has very poor anterior support with bladder bulging  through the introitus with straining.  She has a  large central fascial  defect extending all the way to the cervix.  She also has descent of the  cervix to the introitus.  She has rectocele and enterocele with  significant fascial detachment.  Her sphincters are both intact and  functional.  Rectovaginal exam confirms.  EXTREMITIES:  Negative.  NEUROLOGIC:  Physiologic.   IMPRESSION:  1. Global pelvic organ prolapse, symptomatic.  2. Overactive bladder, improved on Detrol.  3. 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  4. 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  5. Dyslipidemia on Pravachol.   PLAN:  Vaginal hysterectomy, anterior, posterior and enterocele repairs,  cardinal uterosacral colposuspension and Bonanno Cystocath.  I have  discussed risks and benefits of the procedure, possible prolonged  delay  and return to comfortable intercourse, continence control, possible need  for additional bladder support such as sling.           ______________________________  Gretta Cool, M.D.     CWL/MEDQ  D:  05/18/2007  T:  05/18/2007  Job:  [id number]   cc:   Titus Dubin. Alwyn Ren, MD,FACP,FCCP  [phone] [place]  [place] [address]

## 2010-07-16 NOTE — Assessment & Plan Note (Signed)
[facility]                         GASTROENTEROLOGY OFFICE NOTE   NAME:WEISNER-CHRISMONReginia [name]              MRN:          [id number]  DATE:11/03/2006                            DOB:          09-May-1945    ADDENDUM:   EXAMINATION:  VITAL SIGNS:  Weight is 127.6 pounds and blood pressure  118/57, pulse was 84 and regular.  I could not appreciate stigmata of chronic liver disease.  CHEST:  Clear.  She was in a regular rhythm without murmurs, gallops or rubs.  There was  no hepatosplenomegaly, abdominal masses or tenderness.  Bowel sounds  were normal.   ASSESSMENT:  I think that [name] is having a globus  sensation from acid reflux, although I am not sure why she has not  responded to Protonix.  She had excellent results previously on twice a  day PPI therapy this past May.  I do not think she needs endoscopic exam  unless she fails to respond to our new measures.   RECOMMENDATIONS:  1. Change to Aciphex 20 mg 30 minutes before breakfast and supper.  2. Carafate suspension 1 g after meals and at bedtime.  3. Continue Detrol LA as an antispasmodic.  4. Consider 24-hour pH probe monitoring and manometry.     Vania Rea. Jarold Motto, MD, Caleen Essex, FAGA  Electronically Signed    DRP/MedQ  DD: 11/03/2006  DT: 11/03/2006  Job #: [id number]   cc:   Dr. Alwyn Ren

## 2010-07-16 NOTE — Assessment & Plan Note (Signed)
[facility]                         GASTROENTEROLOGY OFFICE NOTE   NAME:[name]              MRN:          [id number]  DATE:01/11/2007                            DOB:          June 27, 1945    [name] continues to complain of burning substernal chest pain without  dysphagia.  I did her endoscopy on December 30, 2006, which is  essentially unremarkable.  Esophageal biopsies were normal and I  empirically dilated her without response.   The patient is taking twice a day AcipHex and Carafate suspension 4  times a day, and continues to be symptomatic.  He is status post  cholecystectomy.  She has had a rather extensive negative cardiac workup  a year ago including echocardiography and Cardiolite stress testing.   EXAM:  Shows her to weigh 132 pounds, blood pressure 110/80, pulse 60  and regular.  I could not appreciate stigmata of chronic liver disease.  There was no  thyromegaly or lymphadenopathy noted.  CHEST:  Clear.  She was in a regular rhythm without murmurs, gallops, or  rubs.  ABDOMEN:  Entirely benign without organomegaly, masses, or tenderness.  Bowel sounds were normal.  Mental status was clear.   ASSESSMENT:  I remain perplexed as to the cause of [name]'s noncardiac  chest pain.  I doubt that it is related to acid reflux with a failure to  respond to Carafate and twice a day proton pump inhibitor therapy.   RECOMMENDATIONS:  1. Outpatient esophageal monometry and 24 hour pH probe testing off of      PPI therapy for 72 hours.  2. In the interim, continue current regimen.  3. Consider referral for fundoplication depending on workup as      mentioned above.     Vania Rea. Jarold Motto, MD, Caleen Essex, FAGA  Electronically Signed    DRP/MedQ  DD: 01/11/2007  DT: 01/12/2007  Job #: [id number]

## 2010-07-16 NOTE — Procedures (Signed)
[facility]                                PROCEDURE NOTE   NAME:[name]                MRN:          [id number]  DATE:03/16/2007                            DOB:          [birth year]    PROCEDURE:  Esophageal manometry.   Esophageal manometry was completed on March 16, 2007.  Results are as  follows.   1. Upper esophageal sphincter:  There is normal coordination between      pharyngeal contraction and cricopharyngeal relaxation.  2. Lower esophageal sphincter:  Mean pressure is normal at 25 mmHg      with normal relaxation and swallowing.  3. Motility pattern:  Normally propagated peristaltic waves of normal      amplitude and duration throughout the length of the esophagus, both      wet and dry swallows.  Normal amplitude of distal esophageal      contraction is 96 mmHg.   ASSESSMENT:  This is a normal esophageal manometry without evidence of  underlying esophageal motility disorder.  The patient's lower esophageal  sphincter length is approximately 6 cm.     Vania Rea. Jarold Motto, MD, Caleen Essex, FAGA  Electronically Signed    DRP/MedQ  DD: 03/30/2007  DT: 03/31/2007  Job #: [id number]   cc:   Adolph Pollack, M.D.

## 2010-07-16 NOTE — Op Note (Signed)
NAME:  [name], [name] NO.:  [id number]   MEDICAL RECORD NO.:  [id number]          PATIENT TYPE:  INP   LOCATION:                               FACILITY:  [facility]   PHYSICIAN:  Gretta Cool, M.D. DATE OF BIRTH:  09/15/1945   DATE OF PROCEDURE:  DATE OF DISCHARGE:                               OPERATIVE REPORT   PREOPERATIVE DIAGNOSIS:  Global pelvic organ prolapse with cystocele,  rectocele, enterocele and uterine descensus.   POSTOPERATIVE DIAGNOSIS:  Global pelvic organ prolapse with cystocele,  rectocele, enterocele and uterine descensus.   PROCEDURE:  Vaginal hysterectomy, anterior and posterior enterocele  repairs.  Cardinal uterosacral colposuspension.   SURGEON:  Beather Arbour, M.D.   ASSISTANT:  Dr. Jeanine Luz.   ANESTHESIA:  General orotracheal.   DESCRIPTION OF PROCEDURE:  Under excellent anesthesia as above with the  patient prepped and draped in Allen stirrups in lithotomy position, her  bladder drained by a Foley catheter, the cervix was grasped with a  single-tooth tenaculum pulled down into view.  It was mucosa was then  infiltrated with Xylocaine with 1:200,000 epinephrine.  The mucosa was  then circumcised and pushed off the lower uterine segment.  At this  point the cul-de-sac was entered posteriorly.  The uterosacral ligaments  were then clamped, cut, sutured and tied with 0 Vicryl.  The cardinal  ligaments were then clamped, cut, sutured and tied with 0 Vicryl.  The  vesicovaginal plica was opened and the Deaver placed beneath the  bladder.  The uterine vessels were then clamped, cut, sutured and tied  with 0-0 Vicryl.  At this point the uterus was inverted and adnexal  pedicles clamped across with Heaney clamps, cut, sutured and tied with 0  Vicryl.  A second free tie was used to doubly ligate the ovarian  ligaments.  At this point the peritoneum was closed by pursestring  suture of 0-0 Monocryl.  The cardinal uterosacral complex was  fixed to  the vaginal cuff with interrupted sutures of 0 Novofil so as to secure  the anterior vaginal wall fascia to the cardinal uterosacral complex.  At this point the anterior repair was undertaken.  The mucosa was  incised and reflected from the underlying endocervical fascia.  The  large central fascial defect was then approximated first with a small  pursestring suture.  The Kelly plication stay sutures were then placed  beneath the urethra for some improvement in urethral support.  The  pubocervical fascia was then plicated in the midline with mattress  closure of 2-0 Vicryl..  The bladder pillars and apex of the fascia was  then secured to the cardinal uterosacral complex at the apex of the  vaginal cuff.  The mucosa was then closed along with the outer fibers of  pubocervical fascia as a subcuticular closure.  At this point attention  was turned to the posterior repair.  The mucosa was grasped at the  introitus posteriorly and the mucosa undermined to the apex of the  vaginal cuff.  The mucosa was then reflected from the perirectal fascia.  At this point the cardinal uterosacral complex was again identified  posteriorly and  the Allis clamps placed so as to hold this as sutures of  0 Novofil were placed.  The Novofil suture was then secured to the  detached perirectal fascia.  A mattress closure was then used to pull  the perirectal fascia again to the apex of the vaginal cuff and close  the estimated 5 cm perirectal fascial defect responsible for the large  enterocele.  The fascia was then secured all the way across the apex of  the vaginal cuff to the anterior vaginal fascia and to the previously  plicated uterosacral cardinal ligaments.  At this point a running suture  of 0-0 Vicryl was used to plicate the perirectal fascia in the midline  from the apex of the cuff to the introitus.  The mucosa was then trimmed  and the upper layers of perirectal fascia and mucosa were  approximated  with a running suture from the apex to the introitus.  The perineal body  muscles were then also repaired and plicated in the midline.  The mucosa  and perineal body skin closure was by subcuticular 2-0 Vicryl.  At this  point the bladder was filled with approximately 400 mL of irrigation  fluid and a Bonanno suprapubic cystocath placed and secured with 0  Novofil.  At this point the procedure terminated without complication.  Patient returned to recovery room in excellent condition.   TISSUE REMOVED:  Uterus only.           ______________________________  Gretta Cool, M.D.     CWL/MEDQ  D:  05/18/2007  T:  05/18/2007  Job:  [id number]

## 2010-07-16 NOTE — Assessment & Plan Note (Signed)
[facility]                         GASTROENTEROLOGY OFFICE NOTE   NAME:WEISNER-CHRISMONReginia [name]              MRN:          [id number]  DATE:12/08/2006                            DOB:          02/01/1946    [name] is much better symptomatically and is no longer having dysphagia or  odynophagia.  We have reviewed her reflux regimen and I have again asked  her to take her PPI therapy 30 minutes before breakfast and supper with  Carafate suspension 20-30 minutes after meals and at bedtime if needed.  She is to follow up with me again in 1-2 months' time, but to consider  this current regime.  Again, as per my previous note, we will consider a  24-hour pH probe test and manometry depending on her clinical course and  clinical response.  She currently has responded well to treatment for  acid and alkaline reflux esophagitis.     Vania Rea. Jarold Motto, MD, Caleen Essex, FAGA  Electronically Signed    DRP/MedQ  DD: 12/08/2006  DT: 12/09/2006  Job #: [id number]

## 2010-07-18 ENCOUNTER — Other Ambulatory Visit: Payer: Self-pay | Admitting: *Deleted

## 2010-07-18 MED ORDER — DULOXETINE HCL 60 MG PO CPEP: 60.0000 mg | ORAL_CAPSULE | Freq: Every day | ORAL | Status: DC

## 2010-07-19 NOTE — Assessment & Plan Note (Signed)
[facility]                                 ON-CALL NOTE   NAME:WEISNER-CHRISMONWilburt [name]             MRN:          [id number]  DATE:02/16/2007                            DOB:          [birth year]    Phone number [phone].   PRIMARY CARE PHYSICIAN:  Dr. Alwyn Ren.   SUBJECTIVE:  Question of flu.   ASSESSMENT/PLAN:  To Saturday clinic.     Kerby Nora, MD  Electronically Signed    AB/MedQ  DD: 04/19/2006  DT: 04/19/2006  Job #: [id number]

## 2010-07-19 NOTE — Discharge Summary (Signed)
[place]. [facility]  Patient:    [name], [name] Visit Number: [id number] MRN: [id number]          Service Type: EMS Location: [facility] Attending Physician:  Devoria Albe Dictated by:   Cornell Barman, P.A. Admit Date:  08/21/2001 Discharge Date: 08/22/2001   CC:         Titus Dubin. Alwyn Ren, M.D. [facility]   Referring Physician Discharge [place]  DISCHARGE DIAGNOSES: 1. Chest pain. 2. Gastroesophageal reflux disease. 3. Cholelithiasis. 4. Hyperlipidemia.  HISTORY OF PRESENT ILLNESS: [name] is a 65 year old female who presented with chest pain.  PAST MEDICAL HISTORY: 1. Status post tubal ligation. 2. Cholelithiasis in 2002. Has not had cholecystitis. 3. Hypercholesterolemia. 4. Depression. 5. History of pneumonia. 6. History of sinus infection. 7. Hiatal hernia. 8. Gastroesophageal reflux disease.  HOSPITAL COURSE: #1 - CHEST PAIN: The patient was admitted to rule out myocardial infarction. Cardiac enzymes were negative. The patient underwent a treadmill stress study that was negative for ischemia. The patient was then discharged home for outpatient follow-up.  LABORATORY DATA: CBC with diff was normal. Coags were normal. C-met was normal. Lipid profile was normal. TSH was 4.094.  DIAGNOSTIC STUDIES: A 2-D echo was normal with preserved LV function and no wall motion abnormality.  MEDICATIONS: 1. Pravachol 20 mg q.d. 2. Celexa 40 mg q.d. 3. Fosamax 70 mg each week. 4. Ambien p.r.n. 5. Seena 40 mg q.d. 6. Climara patch. 7. Trometrium 100 mg q.d.  FOLLOW-UP: With Dr. Alwyn Ren in two to three weeks. Dictated by:   Cornell Barman, P.A. Attending Physician:  Devoria Albe DD:  09/15/01 TD:  09/20/01 Job: [id number]

## 2010-07-19 NOTE — Procedures (Signed)
NAME:  [name], [name] NO.:  [id number]   MEDICAL RECORD NO.:  [id number]          PATIENT TYPE:  OUT   LOCATION:  SLEEP CENTER                 FACILITY:  [facility]   PHYSICIAN:  Clinton D. Maple Hudson, MD, FCCP, FACPDATE OF BIRTH:  13-Jan-1944   DATE OF STUDY:  10/21/2005                              NOCTURNAL POLYSOMNOGRAM   REFERRING PHYSICIAN:  Dr. Osborn Coho   INDICATION FOR STUDY:  Hypersomnia with sleep apnea.  Complaints of episodic  daytime fatigue and loud snoring.   EPWORTH SLEEPINESS SCORE:  3/24.   BMI:  20.   WEIGHT:  133 pounds.   HOME MEDICATION:  Trazodone, AcipHex, gabapentin, pravastatin, Prometrium,  Climara.   SLEEP ARCHITECTURE:  Total sleep time 342 minutes with sleep efficiency 94%.  Stage I was 1%, stage II 68%, stages III and IV 3%, REM 28% of total sleep  time.  Sleep latency 23 minutes, REM latency 110 minutes, awake after sleep  onset 2 minutes, arousal index 13.  No bedtime medication reported.   RESPIRATORY DATA:  Apnea/hypopnea index (AHI, RDI) 22.8 obstructive events  per hour indicating moderately severe obstructive sleep apnea/hypopnea  syndrome.  This reflected 60 obstructive apneas, 1 mixed apnea and 69  hypopneas.  Most events were recorded while sleeping supine, although  significant numbers were recorded while on left side.  There were few events  before 2 a.m. preventing her from meeting protocol requirements for C-PAP  titration by split study on this study night.   OXYGEN DATA:  Moderate snoring with oxygen desaturation to a nadir of 85%.  Mean oxygen saturation was 95% on room air through the study.   CARDIAC DATA:  Normal sinus rhythm.   MOVEMENTS-PARASOMNIA:  A total of 121 limb jerks were recorded but virtually  none were associated with arousal or awakening.   IMPRESSION/RECOMMENDATIONS:  1. Moderate obstructive sleep apnea/hypopnea syndrome, AHI 22.8 per hour.      Events were positional, mostly associated  with supine sleep position.      Moderate snoring was also primarily associated with supine sleep      position, oxygen desaturating to 85% on room air.  2. Consider return for C-PAP titration or evaluate for alternative      therapies as appropriate.      Clinton D. Maple Hudson, MD, [facility], FACP  Diplomate, Biomedical engineer of Sleep Medicine  Electronically Signed     CDY/MEDQ  D:  10/25/2005 15:05:16  T:  10/27/2005 00:22:58  Job:  [id number]

## 2010-07-19 NOTE — Discharge Summary (Signed)
NAME:  [name], [name]     ACCOUNT NO.:  [id number]   MEDICAL RECORD NO.:  [id number]         PATIENT TYPE:  LINP   LOCATION:                               FACILITY[facility]  [facility]   PHYSICIAN:  Gretta Cool, M.D. DATE OF BIRTH:  [birth year]   DATE OF ADMISSION:  05/17/2007  DATE OF DISCHARGE:  05/19/2007                               DISCHARGE SUMMARY   HISTORY OF PRESENT ILLNESS:  The patient is a 65 year old female gravida  2, para 2 who has well documented global pelvic support issues.  She has  a large cystocele, rectocele, and enterocele with uterine descensus to  the introitus.  She has had progressive weakness over time.  It is also  noted that she has an overactive bladder reasonably controlled on  Detrol, and denies any significant difficulties with stress  incontinence.  She is on no hormone replacement.  However, she has  intermittently used vaginal estrogen.  She has been instructed to avoid  straining at stool and correcting constipation prior to the repair.  She  is now admitted for definitive therapy by vaginal hysterectomy.   PAST MEDICAL HISTORY:  Significant for cholesterol elevation on  Pravachol treated by Dr. Alwyn Ren.  She has a history of gallstones,  abnormal vaginal bleeding with a D & C, tubal sterilization, and sinus  surgery.   PRESENT MEDICATIONS:  Please see History and Physical for her present  medications.   ADMISSION PHYSICAL EXAMINATION:  GENERAL:  A well-developed, well-  nourished thin white female.  VITAL SIGNS:  Were within normal limits.  She has a BMI of 20.  CHEST:  Clear to A & P.  HEART:  Rate and rhythm were regular without murmur, gallop or cardiac  enlargement.  ABDOMEN:  Soft and scaphoid without masses or organomegaly.  PELVIC:  External genitalia within normal limits for female with the  exception of widening of the genital hiatus.  She has very poor interior  support with bladder bulging through the introitus with straining.  There is a large central fascial defect extending all the way down to  the cervix.  She also has descent of the cervix to the introitus.  There  is a rectocele and enterocele with significant fascial detachment.  The  sphincter both intact and functional.  Rectovaginal exam confirms.   IMPRESSION:  1. Global pelvic support prolapse symptomatic.  2. Overactive bladder improved on Detrol.  3. 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  4. Vaginal and genital atrophy improved with Estrace vaginal cream.  5. Dyslipidemia on Pravachol.   PLAN:  Vaginal hysterectomy, anterior posterior and enterocele repairs,  cardinal uterosacral colposuspension, and benign Cystocath.  Risks and  benefits have been discussed with the patient, and she accepts these  procedures.  She also understands there may be a delay in return to  comfortable intercourse, continence control, and possible need for  additional bladder support such as a sling in the future.   LABORATORY DATA:  Admission hemoglobin 12.5, hematocrit 36 on the first  postoperative day, hemoglobin 10.3, hematocrit 30.   EKG:  Normal sinus rhythm, normal ECG.  Chest x-ray:  Normal chest.   HOSPITAL COURSE:  The patient underwent vaginal hysterectomy, anterior  and posterior enterocele repairs, cardinal uterosacral colposuspension  under general anesthesia.  The procedures were completed without any  complications, and the patient was returned to the recovery room in  excellent condition.  Hospital course was complicated by significant  sedation on her PCA, and once off the PCA she improved.  However, she  was somewhat sedated with a combination of trazodone and Percocet.  She  was ambulating well.  She was discharged on the second postoperative day  in excellent condition.  She was discharged with her suprapubic catheter  intact and was instructed on clamping and releasing and recorded both  residual and voiding urines.  She was instructed to avoid heavy  lifting  and straining, no vaginal entrance, and increase ambulation as  tolerated.  She is to report any fever of over 100.4.  She was also  encouraged to use stool softeners and to have daily bowel movements  without straining.  She is to call the office and report two days later  about her voided and residual urines and return to the office as  instructed for removal of her suprapubic catheter.   DIET:  Regular.   MEDICATIONS:  Laxatives were stool softener of her choice.   FINAL DISCHARGE DIAGNOSIS:  Global pelvic support prolapse with  cystocele, rectocele, enterocele, and uterine descensus.   PROCEDURES PERFORMED:  Vaginal hysterectomy anterior and posterior and  enterocele repairs, cardinal uterine uterosacral colposuspension under  general anesthesia.      Matt Holmes, N.P.    ______________________________  Gretta Cool, M.D.    EMK/MEDQ  D:  07/05/2007  T:  07/05/2007  Job:  [id number]

## 2010-07-19 NOTE — Procedures (Signed)
NAME:  [name], [name] NO.:  [id number]   MEDICAL RECORD NO.:  [id number]          PATIENT TYPE:  OUT   LOCATION:  SLEEP CENTER                 FACILITY:  [facility]   PHYSICIAN:  Clinton D. Maple Hudson, MD, FCCP, FACPDATE OF BIRTH:  [birth year]   DATE OF STUDY:  02/01/2006                            NOCTURNAL POLYSOMNOGRAM   REFERRING PHYSICIAN:  Onalee Hua L. Annalee Genta, M.D.   INDICATIONS FOR STUDY:  Hypersomnia with sleep apnea.  Epworth  sleepiness score 2/24.  BMI 20.  Weight 136 pounds.   HOME MEDICATIONS:  Gabapentin, Zyrtec, Aciphex, trazodone, pravastatin,  Prometrium, Climara, Nasonex.   A baseline diagnostic study on 10/21/2005 had given an AHI of 22.8/hr.  CPAP titration is now requested.   SLEEP ARCHITECTURE:  Total sleep time 275 minutes with a sleep  efficiency of 79%. Stage I was 3%, stage II 42%, stages III and IV 29%.  REM 25% of total sleep time. Sleep latency 23 minutes. REM latency 92  minutes. Awake after sleep onset 56 minutes. Arousal index 7.4.  Bedtime  medications were gabapentin, Zyrtec, Aciphex, trazodone and Nasonex.   RESPIRATORY DATA:  CPAP titration protocol.  CPAP was titrated to 8 CWP,  AHI 1.4/hr.  A medium Mirage Quattro mask was used with a heated  humidifier.   OXYGEN DATA:  Oxygen saturation on CPAP was 96% on room air.   CARDIAC DATA:  Sinus rhythm.   MOVEMENT/PARASOMNIA:  A total of 76 limb jerks were recorded of which 12  were associated with arousal or awakening for a periodic limb movement  with arousal index of 2.6 per hour which is probably not significant.   IMPRESSION:  1. Successful CPAP titration to CWP, AHI 1.4/hr.  A medium Quattro      Mirage mask was used with a heated      humidifier.  2. Baseline diagnostic NPSG on 10/21/2005 recorded an AHI of 22.8/hr.      Clinton D. Maple Hudson, MD, [facility], FACP  Diplomate, Biomedical engineer of Sleep Medicine  Electronically Signed     CDY/MEDQ  D:  02/08/2006 11:09:22  T:   02/08/2006 17:53:26  Job:  [id number]   cc:   Onalee Hua L. Annalee Genta, M.D.  Fax: [phone]

## 2010-07-19 NOTE — Consult Note (Signed)
[place]. [facility]  Patient:    [name], [name] Visit Number: [id number] MRN: [id number]          Service Type: MED Location: [id number] Attending Physician:  Corwin Levins Dictated by:   Noralyn Pick. Eden Emms, M.D., [facility] Proc. Date: 08/17/01 Admit Date:  08/16/2001   CC:         Titus Dubin. Alwyn Ren, M.D. [facility]   Consultation Report  HISTORY OF PRESENT ILLNESS:  [name] is a 65 year old patient of Dr. Alwyn Ren.  She is admitted with chest pain, and pain is atypical for coronary disease.  It has been somewhat constant over the last few days.  There was no change with food or activity.  She is fairly active in the garden at home.  She has had previous stress test x 2 at the [facility] office which apparently have been negative.  Her pain this admission has been associated with some nausea and occasional dyspnea.  ALLERGIES:  No known drug allergies.  PAST MEDICAL HISTORY:  Remarkable for hyperlipidemia for which she is on Pravachol.  She has a history of GERD and gallstones.  PAST SURGICAL HISTORY:  Includes sinus surgery and tubal ligation.  SOCIAL HISTORY:  She lives in [place] with her husband.  Her other coronary risk factors are negative except for hyperlipidemia.  PHYSICAL EXAMINATION:  GENERAL:  This is a woman looking for her stated age.  VITAL SIGNS:  Blood pressure 100/palpable.  She is in sinus rhythm with a rate of 68.  LUNGS:  Clear.  NECK:  Carotids are normal.  HEART:  There is an S1, split second heart sound.  ABDOMEN:  Normal.  EXTREMITIES:  Normal pulses, no edema.  DIAGNOSTIC DATA:  Electrocardiogram was essentially normal.  Her enzymes were negative x 2.  IMPRESSION:  Atypical chest pain.  The patient will be scheduled for Cardiolite study in the morning; then she can be discharged if this is negative.  I suspect this represents either gastroesophageal reflux disease or musculoskeletal chest  pain.  At the present time, I do not think she needs to be on heparin. Dictated by:   Noralyn Pick Eden Emms, M.D., [facility] Attending Physician:  Corwin Levins DD:  08/17/01 TD:  08/17/01 Job: [id number]

## 2010-07-19 NOTE — Assessment & Plan Note (Signed)
[facility]                        [place] [facility] OFFICE NOTE   NAME:[name]              MRN:          [id number]  DATE:06/11/2006                            DOB:          11/14/1945    [name] was seen June 05, 2006, for comprehensive physical examination.   She has active issues of some pain in the retropatellar area on the  right.  She has been seeing Dr. Ranell Patrick, orthopedist, and is now in  physical therapy.  This has limited her exercise.   Additionally, she has urinary incontinence and has been evaluated by Dr.  Beather Arbour, gynecologist.  He has raised the possibility of bladder  surgery and/or hysterectomy.   The remainder of her past medical history is unchanged.  She has had  tubal ligation, sinus surgery, vaginal cystectomy, shoulder surgery,  colonoscopy.  The colonoscopy revealed diverticulosis in 2006.  She had  gastritis and hiatal hernia on endoscopy in 2006.  Cholecystectomy in  2004.  She was hospitalized with chest pain with no evidence of  myocardial infarction in 2003.  Initially, she has had dyslipidemia and  is on pravastatin.  She has osteoporosis and previously been on  alendronate, calcium & vit D. Last bone density in May of 2006; repeat  due in May 2008.   She has also had facial plastic surgery with Dr. Dub Amis.  She has  renal calculus.  She had breast implants.   FAMILY HISTORY:  Lung cancer in cousin.  Uncle had diabetes.  Mother had  hypertension, pneumonia and splenectomy.  Splenectomy may have been for  thrombocytopenia.  Father had hypertension, stroke, myocardial  infarction in his 33s.  An uncle had myocardial infarction.  She did  quit smoking in 1996.  She does not drink.  She does follow a heart  healthy diet.   REVIEW OF SYSTEMS:  As noted above.   She is 5 feet 9 inches and weight 137 which is stable.  Afebrile.  Pulse  is 62.  Respiratory rate 17 , blood pressure 98/62.   She  has no icterus; fundal examination revealed normal vasculature.  Hygiene is excellent.  Otolaryngological examination is unremarkable.   Thyroid has slightly irregular surface contour but no nodules were  palpable.   There is no lymphadenopathy of head, neck or axilla.  There is a classic  click in the apex without murmur.   Chest:  Clear.   There is no organomegaly or masses.   Pelvic rectal examination  is deferred.   There is mild crepitus on the left knee.  She has no effusion, and she  has full range of motion.   Pedal pulses are intact, and there is no edema.   Skin:  Clear of lesions.   EKG normal.   She will have fasting LABS and changes made in her current medications  dependent on those results.  At present, she is on generic pravastatin  40 mg daily, also vitamins, calcium, gabapentin, glucosamine, Detrol LA,  trazodone, Skelaxin, ReQuip.  She was informed that the glucosamine may  raise cholesterol.  Were othopedic surgery necessary  I find no  contraindication based  on detailed exam.     Titus Dubin. Alwyn Ren, MD,FACP,FCCP  Electronically Signed    WFH/MedQ  DD: 06/11/2006  DT: 06/11/2006  Job #: [id number]

## 2010-07-22 ENCOUNTER — Encounter: Payer: Self-pay | Admitting: Gastroenterology

## 2010-07-24 ENCOUNTER — Encounter: Payer: Self-pay | Admitting: *Deleted

## 2010-07-25 ENCOUNTER — Telehealth: Payer: Self-pay | Admitting: Gastroenterology

## 2010-07-25 ENCOUNTER — Encounter: Payer: Self-pay | Admitting: Gastroenterology

## 2010-07-25 ENCOUNTER — Ambulatory Visit (INDEPENDENT_AMBULATORY_CARE_PROVIDER_SITE_OTHER): Payer: 59 | Admitting: Gastroenterology

## 2010-07-25 VITALS — BP 100/78 | HR 80 | Ht 68.0 in | Wt 125.0 lb

## 2010-07-25 DIAGNOSIS — R197 Diarrhea, unspecified: Secondary | ICD-10-CM

## 2010-07-25 DIAGNOSIS — R1032 Left lower quadrant pain: Secondary | ICD-10-CM

## 2010-07-25 DIAGNOSIS — K589 Irritable bowel syndrome without diarrhea: Secondary | ICD-10-CM

## 2010-07-25 DIAGNOSIS — F341 Dysthymic disorder: Secondary | ICD-10-CM

## 2010-07-25 DIAGNOSIS — K5730 Diverticulosis of large intestine without perforation or abscess without bleeding: Secondary | ICD-10-CM

## 2010-07-25 DIAGNOSIS — K219 Gastro-esophageal reflux disease without esophagitis: Secondary | ICD-10-CM

## 2010-07-25 DIAGNOSIS — F418 Other specified anxiety disorders: Secondary | ICD-10-CM

## 2010-07-25 MED ORDER — ALOSETRON HCL 0.5 MG PO TABS: 0.5000 mg | ORAL_TABLET | Freq: Every day | ORAL | Status: DC

## 2010-07-25 NOTE — Patient Instructions (Addendum)
Stop your Librax, Nexium, Lialda, Magnesium, Pravachol, Prednisone Start  Lotronex once a day. You have signed a patient acknowledgement form today for Lotronex. Call Monday with a up date on your symptoms.

## 2010-07-25 NOTE — Telephone Encounter (Deleted)
Labs in for walk in order

## 2010-07-25 NOTE — Progress Notes (Signed)
This is a somewhat complicated [name] with chronic GERD who recently has had severe diarrhea with crampy lower abdominal pain actually required hospitalization 6 weeks ago. Biopsies and colonoscopy were suggestive of lymphocytic colitis, but treatment with Entocort was unsuccessful. I therefore repeated her colonoscopy and she had mild segmental colitis and was treated with prednisone in high doses of 40 mg a day without any improvement. Actually biopsies on review are normal. She also has been on oral aminosalicylates, and Librax with only mild improvement. She has had no prednisone side effects. This is despite the fact that she has a long history of depression and is on Desyrell 100 mg twice a day. He continues to complain of general malaise, fatigue, and crampy lower abdominal pain, and 5-6 mucousy bowel movements a day. Other complaints are continued hoarseness spike vigorous treatment for acid reflux. Additional medications include Cymbalta 60 mg a day, calcium with vitamin D, and for some reason magnesium tablets.  Current Medications, Allergies, Past Medical History, Past Surgical History, Family History and Social History were reviewed in [employer] record.  Pertinent Review of Systems Negative   Physical Exam: Awake and alert no acute distress. I cannot appreciate stigmata of chronic liver disease. Her abdominal exam shows no organomegaly, masses, significant tenderness, or abnormal bowel sounds. Inspection of the rectum external hemorrhoids and skin tags but no fissures or fistulae. Rectal exam shows no masses or tenderness,  there is no stool present for guaiac testing. Mental status is clear the patient has a continued flat depressed- like affect.    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 Her GI workup otherwise has been extensive. Repeat colon biopsies have not shown evidence of inflammatory bowel  disease or lymphocytic colitis. A brief trial of prednisone therapy and aminosalicylates also has not helped at all. I have discontinued Librax, Nexium, Lialda, Pravachol, and prednisone. I spoke with her at length today concerning masses back to diagnosis, and I suggested we try Lotronex 0.5 mg once a day to be adjusted as per symptomatology. If she cannot tolerate this medication, or refuses to take this medication, we will seek referral to a tertiary Medical Center. I have asked her to not fixating all her hoarseness and upper GI issues, and only use  when necessary Carafate suspension. She is to call us in one week's time for progress report.    Please copy her primary care physician, referring physician, and pertinent subspecialists. No diagnosis found.

## 2010-07-25 NOTE — Telephone Encounter (Signed)
Patient can get lotronex at [facility] [address]. [phone]

## 2010-07-30 ENCOUNTER — Telehealth: Payer: Self-pay | Admitting: Gastroenterology

## 2010-07-30 ENCOUNTER — Telehealth: Payer: Self-pay | Admitting: *Deleted

## 2010-07-30 NOTE — Telephone Encounter (Signed)
Good job,I agree.Marland KitchenMarland Kitchen

## 2010-07-30 NOTE — Telephone Encounter (Addendum)
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 Her GI workup otherwise has been extensive. Repeat colon biopsies have not shown evidence of inflammatory bowel disease or lymphocytic colitis. A brief trial of prednisone therapy and aminosalicylates also has not helped at all. I have discontinued Librax, Nexium, Lialda, Pravachol, and prednisone. I spoke with her at length today concerning masses back to diagnosis, and I suggested we try Lotronex 0.5 mg once a day to be adjusted as per symptomatology. If she cannot tolerate this medication, or refuses to take this medication, we will seek referral to a tertiary Medical Center. I have asked her to not fixating all her hoarseness and upper GI issues, and only use  when necessary Carafate suspension. She is to call us in one week's time for progress report.    Pt reports constipation. ON 07/25/10, she had 8 stools, same on 5/25; 5/25 5 stools, 5/27 5 stools yesterday she had 2 stools that were nothing but mucous. Pt also informed me of antibiotics that she took, but forgot to tell Dr Jarold Motto. Pt did not take Lotronex 0.5mg  last evening. She was advised to take a stool softener today and questioned whether she should be seen. Explained to pt Dr Jarold Motto may need to refer her d/t her complex issues and the fact that the drugs prescribed aren't helping her.  REFER TO [facility]?   Thanks

## 2010-07-30 NOTE — Telephone Encounter (Signed)
Pt called back in and wanted to know if she can start back on the meds Dr Jarold Motto had her stop- Librax, Nexium and Pravachol. Informed pt since they had no impact on her condition she could try. She reports she still has abdominal cramping so the Librax is ik. Is she should have s&s of reflux, she may restart the Nexium. If she has high cholesterol, she may want to resume the Pravachol. Pt also stated she may try to restart the Lotronex- she will call if it constipates her.

## 2010-07-30 NOTE — Telephone Encounter (Signed)
Notified pt of her appt at [facility] with Dr Lorenda Peck on 09/06/10 at 0830am. I have faxed the info from our office to [phone]. This is the 1st available appt with any GI md.  Pt reports she took a stool softener and ate some Mini Wheats and had a BM.

## 2010-08-07 ENCOUNTER — Telehealth: Payer: Self-pay | Admitting: Gastroenterology

## 2010-08-07 NOTE — Telephone Encounter (Signed)
Pt called to report she she saw Dr Osborn Coho, ENT yesterday for her hoarseness. Dr Annalee Genta will send [place] office notes. Pt reports she thinks her hoarseness was the result of Dr Alwyn Ren switching her from Aciphex 20mg  bid to Nexium 40mg  bid. She wants to know if it's ok to switch back, she has Acipehex left over. Informed pt ok to switch back to Aciphex and per Dr Norval Gable last note, she will take Carafate prn. Pt did receive her info from [facility] and wants to know if she gets better, does she still have to go. Advised pt to keep a good record of her meds and if she gets better, call us and Dr Jarold Motto will review the case. Pt stated understanding.

## 2010-08-14 ENCOUNTER — Telehealth: Payer: Self-pay | Admitting: Gastroenterology

## 2010-08-14 NOTE — Telephone Encounter (Signed)
Pt is still taking the lotronex and she will continue taking since she is getting some relief. She asked how long she has to take the medication I have advised if its helping she needs to continue indefinitely.

## 2010-08-27 ENCOUNTER — Telehealth: Payer: Self-pay | Admitting: Gastroenterology

## 2010-08-27 NOTE — Telephone Encounter (Signed)
Spoke with pt and explained we have note received anything from Dr Annalee Genta at this time. The report could be in transit or in the mailroom, but we have not seen it. Offered to give her our fax number so she can request the report again. Pt is not at home; she will call later to get the number.

## 2010-09-11 ENCOUNTER — Telehealth: Payer: Self-pay | Admitting: Gastroenterology

## 2010-09-11 DIAGNOSIS — R490 Dysphonia: Secondary | ICD-10-CM

## 2010-09-11 NOTE — Telephone Encounter (Signed)
Notified pt of her appt at [facility] on 09/16/10 at 0800am. She checks in at Admissions and is to be NPO after midnight and is to remain off her PPI or H2 blocker for 2-3 days prior to her. Mailed instructions to her and faxed order to ENDO.

## 2010-09-11 NOTE — Telephone Encounter (Signed)
Pt called to report she went to [facility] and they felt like she needs a 24 hour PH probe study. Dr Annalee Genta suggested that as well as an Esophageal Manometry test. OK per Dr Jarold Motto to order.

## 2010-09-16 ENCOUNTER — Telehealth: Payer: Self-pay | Admitting: *Deleted

## 2010-09-16 ENCOUNTER — Ambulatory Visit (HOSPITAL_COMMUNITY)
Admission: RE | Admit: 2010-09-16 | Discharge: 2010-09-16 | Disposition: A | Discharge status: Home / Self Care | Payer: 59 | Source: Ambulatory Visit | Attending: Gastroenterology | Admitting: Gastroenterology

## 2010-09-16 ENCOUNTER — Encounter (INDEPENDENT_AMBULATORY_CARE_PROVIDER_SITE_OTHER): Payer: 59 | Admitting: Gastroenterology

## 2010-09-16 DIAGNOSIS — R490 Dysphonia: Secondary | ICD-10-CM

## 2010-09-16 DIAGNOSIS — K219 Gastro-esophageal reflux disease without esophagitis: Secondary | ICD-10-CM | POA: Insufficient documentation

## 2010-09-16 NOTE — Telephone Encounter (Signed)
[name] reports pt arrived today at 0800am for her 24hr PH Probe and Eso. Manometry test. Pt's appt was to be at 0900am and pt was upset she got there so early. Explained to [name] I was given the appt at the ENDO desk; I don't remember who gave me the appt. She will apologize to the pt.

## 2010-09-17 ENCOUNTER — Other Ambulatory Visit: Payer: Self-pay | Admitting: Gynecology

## 2010-09-24 ENCOUNTER — Encounter: Payer: Self-pay | Admitting: Internal Medicine

## 2010-09-24 ENCOUNTER — Telehealth: Payer: Self-pay | Admitting: Gastroenterology

## 2010-09-24 NOTE — Telephone Encounter (Signed)
Pt reports left sided pain from her stomach down into her pelvic area. She stated the pain began around 09/06/10 before she was seen at [facility].  She rates her pain a 7 on 0-10 scale but the pain isn't constant and eating or BM's seem to affect the pain. She reports diarrhea some days, but she has been been eating fresh fruits and vegetables. She denies a temp, blood in her stool. Pt went to [facility] for adb pain that didn't resolve despite Lotronex, Entocort , Lialda, and Prednisone. She was referred to Dr Lorenda Peck whom she said stated her problem was caused by antibiotics. Per him, she is on Align, Hyoscamine, Aciphex, and Carafate. She has a hx of GERD, IBS, Fibromyalgia, Diverticulosis and chronic hoarseness. Pt has Esophageal Manometry and 24hr PH Probe study done 09/16/10, but the report is not back yet. Pt asked to call Dr Vernell Morgans ofc for an appt since we referred her there because Dr Jarold Motto ran out of tx for her. If she should begin to have rectal bleeding or the pain gets worse or she has a fever, she was asked to call immediately; pt stated understanding.

## 2010-10-01 NOTE — Progress Notes (Signed)
  Esophageal manometry and 24-hour pH probe testing was performed on July 16.  Esophageal manometry is as follows:  1. UES-normal coordination between pharyngeal contraction and cricopharyngeal relaxation.  2. Lower esophageal sphincter-mean pressure is 35 mm of mercury with normal relaxation swallowing.  3. Esophageal motility-there is normal esophageal peristalsis with a mean amplitude of contractions at 104 mmHg. 100% of waves are peristaltic. There is no evidence of esophageal dysmotility or spasm.  Assessment: This entirely normal esophageal manometry without any evidence of an esophageal motility disorder or lower esophageal sphincter incompetency.   The 24 hour pH probe monitor exam was completed from July 16 2 09/17/2010. Results are as follows: Total DeMeester score is normal at 5.4 normal less than 22. There is no significant proximal or distal acid reflux in the supine or upright positions. There also is no positive symptom index. The patient is on  Nexium 40 mg twice a day. Percentage of time the pH was less than 4 is less than 1% of both the upright and supine positions in both proximal and distal pH probe.   Assessment: Normal esophageal manometry without evidence of LES incompetency or an esophageal motility disorder. 4 a pH probe testing on PPI therapy is entirely normal without any evidence of significant acid reflux proximally or distally.

## 2010-10-03 ENCOUNTER — Telehealth: Payer: Self-pay | Admitting: Gastroenterology

## 2010-10-03 NOTE — Telephone Encounter (Signed)
Pt advised test normal.

## 2010-10-07 ENCOUNTER — Encounter: Payer: Self-pay | Admitting: Gastroenterology

## 2010-11-11 ENCOUNTER — Encounter: Payer: Self-pay | Admitting: Gastroenterology

## 2010-11-11 NOTE — Telephone Encounter (Signed)
Error

## 2010-11-25 LAB — HEMOGLOBIN AND HEMATOCRIT, BLOOD
HCT: 36
HCT: 30 — ABNORMAL LOW
Hemoglobin: 10.3 — ABNORMAL LOW

## 2010-12-16 ENCOUNTER — Ambulatory Visit: Payer: 59 | Admitting: Internal Medicine

## 2010-12-23 ENCOUNTER — Encounter: Payer: Self-pay | Admitting: Internal Medicine

## 2010-12-23 ENCOUNTER — Ambulatory Visit (INDEPENDENT_AMBULATORY_CARE_PROVIDER_SITE_OTHER): Payer: 59 | Admitting: Internal Medicine

## 2010-12-23 ENCOUNTER — Ambulatory Visit: Payer: 59 | Admitting: Internal Medicine

## 2010-12-23 VITALS — BP 100/60 | HR 80 | Temp 97.9°F | Wt 130.0 lb

## 2010-12-23 DIAGNOSIS — Z0000 Encounter for general adult medical examination without abnormal findings: Secondary | ICD-10-CM

## 2010-12-23 DIAGNOSIS — Z23 Encounter for immunization: Secondary | ICD-10-CM

## 2010-12-23 NOTE — Patient Instructions (Addendum)
Preventive Health Care: Exercise  30-45  minutes a day, 3-4 days a week. Walking is especially valuable in preventing Osteoporosis. Eat a low-fat diet with lots of fruits and vegetables, up to 7-9 servings per day. Consume less than 30 grams of sugar per day from foods & drinks with High Fructose Corn Syrup as # 1,2,3 or #4 on label. The triggers for reflux or "heart burn"  include stress; the "aspirin family" ; alcohol; peppermint; and caffeine (coffee, tea, cola, and chocolate). The aspirin family would include aspirin and the nonsteroidal agents such as ibuprofen &  Naproxen. Tylenol would not cause reflux. If having reflux  ; food & drink should be avoided for @ least 2 hours before going to bed.  Health Care Power of Attorney & Living Will place you in charge of your health care  decisions. Verify these are  in place. Please do not use Q-tips as we discussed. Should wax build up occur, please put 2-3 drops of mineral oil in the ear at night and cover the canal with a  cotton ball.In the morning fill the canal with hydrogen peroxide & leave  for 10-15 minutes.Following this shower and use the thinnest washrag available to wick out the wax.

## 2010-12-23 NOTE — Progress Notes (Signed)
Subjective:    Patient ID: [name], female    DOB: 01/12/1946, 65 y.o.   MRN: [id number]  HPI Medicare Wellness Visit:  The following psychosocial & medical history were reviewed as required by Medicare.   Social history: caffeine: occasional  intake , alcohol:  no ,  tobacco use : quit 2000  & exercise : minimal.   Home & personal  safety / fall risk: no issues, activities of daily living: no limitations , seatbelt use : yes , and smoke alarm employment : yes .  Power of Attorney/Living Will status : no  Vision ( as recorded per Nurse) & Hearing  evaluation :  Opth pending this week.Whisper heard@ 6 ft. Orientation :oriented X3 , memory & recall :good, spelling testing: good,and mood & affect : normal . Depression / anxiety: anxiety intermittently, previously on Cymbalta Travel history : never , immunization status :no Shingles  , transfusion history:  no, and preventive health surveillance ( colonoscopies, BMD , etc as per protocol/ [facility]): extensive GI evaluation by Dr Annia Friendly, Dental care:  Every 6 mos . Chart reviewed &  Updated. Active issues reviewed & addressed.       Review of Systems Cough & throat clearing Onset:03/2010 Extrinsic symptoms:itchy eyes, sneezing:no  Infectious symptoms :fever, purulent secretions :no Chest symptoms:pleuritic  pain, sputum production, hemoptysis,wheezing:no. Dyspnea w/o trigger; no PNDyspnea GI symptoms: Dyspepsia, reflux:no. ENT evaluation with direct laryngoscopy negative.Colitis ?from antibiotics in 2/12. Dr Annalee Genta & Dr Lorenda Peck evaluations reviewed.   Occupational/environmental exposures:no Smoking:quit 2000 ACE inhibitor:no Treatment/efficacy: no relievers Past medical history/family history pulmonary disease: no    Objective:   Physical Exam Gen.: Thin but healthy and well-nourished in appearance. Alert, appropriate and cooperative throughout exam. Head: Normocephalic without obvious abnormalities  Eyes: No  corneal or conjunctival inflammation noted.  Extraocular motion intact.  Ears: External  ear exam reveals no significant lesions or deformities. Canals clear .TMs normal. Hearing is grossly normal bilaterally. Nose: External nasal exam reveals no deformity or inflammation. Nasal mucosa are pink and moist. No lesions or exudates noted.  Mouth: Oral mucosa and oropharynx reveal no lesions or exudates. Teeth in good repair. Neck: No deformities, masses, or tenderness noted. Range of motion slightly decreased. Thyroid normal. Lungs: Normal respiratory effort; chest expands symmetrically. Lungs are clear to auscultation without rales, wheezes, or increased work of breathing. Heart: Normal rate and rhythm. Normal S1 and S2. No gallop, click, or rub. No murmur. Abdomen: Bowel sounds normal; abdomen soft and nontender. No masses, organomegaly or hernias noted. Aortic bruit w/o AAA. Genitalia: Dr Nicholas Lose   .                                                                                   Musculoskeletal/extremities: No deformity or scoliosis noted of  the thoracic or lumbar spine. No clubbing, cyanosis, edema, or deformity noted. Range of motion  normal .Tone & strength  normal.Joints normal. Nail health  good. Vascular: Carotid, radial artery, dorsalis pedis and  posterior tibial pulses are full and equal.  Neurologic: Alert and oriented x3. Deep tendon reflexes symmetrical and normal.          Skin: Intact  without suspicious lesions or rashes. Lymph: No cervical, axillary  lymphadenopathy present. Psych: Mood and affect are normal. Normally interactive                                                                                         Assessment & Plan:  #1 Medicare Wellness Exam; criteria met ; data entered #2 Problem List reviewed ; Assessment/ Recommendations made Plan: see Orders

## 2010-12-24 ENCOUNTER — Ambulatory Visit: Payer: 59

## 2010-12-27 ENCOUNTER — Institutional Professional Consult (permissible substitution): Payer: 59 | Admitting: Cardiovascular Disease

## 2010-12-29 ENCOUNTER — Encounter: Payer: Self-pay | Admitting: Internal Medicine

## 2010-12-30 ENCOUNTER — Ambulatory Visit (INDEPENDENT_AMBULATORY_CARE_PROVIDER_SITE_OTHER)
Admission: RE | Admit: 2010-12-30 | Discharge: 2010-12-30 | Disposition: A | Discharge status: Home / Self Care | Payer: 59 | Source: Ambulatory Visit | Attending: Internal Medicine | Admitting: Internal Medicine

## 2010-12-30 ENCOUNTER — Encounter: Payer: Self-pay | Admitting: Internal Medicine

## 2010-12-30 ENCOUNTER — Ambulatory Visit (INDEPENDENT_AMBULATORY_CARE_PROVIDER_SITE_OTHER): Payer: 59 | Admitting: Internal Medicine

## 2010-12-30 DIAGNOSIS — R079 Chest pain, unspecified: Secondary | ICD-10-CM

## 2010-12-30 DIAGNOSIS — R000 Tachycardia, unspecified: Secondary | ICD-10-CM

## 2010-12-30 DIAGNOSIS — R002 Palpitations: Secondary | ICD-10-CM

## 2010-12-30 NOTE — Patient Instructions (Signed)
Stop asmanex Get the chest XR ER if symptoms severe tonight  Nasonex 2 puffs every day  mucinex DM twice a day for chest congestion

## 2010-12-30 NOTE — Progress Notes (Signed)
  Subjective:    Patient ID: [name], female    DOB: [birth year], 65 y.o.   MRN: [id number]  HPI Acute visit. She was seen last week by her PCP, he did a physical exam, at that time she cough for several months and she was  prescribed Asmanex. She was also having on and off chest discomfort for a few weeks, she has an appointment to see cardiology tomorrow. She's here because she's not getting better: Continue with cough, continue with a "funny feeling in the chest". She described a symptom as knife like-sharp or pressure on and off, at the anterior chest, ?radiation to the back; pain can be at rest or sometimes when she lies on the sides in bed at night. No associated sweats or nausea; the pain has not been exertional. She's also c/o sore throat for 3 days, nasal congestion, ear pain bilaterally  When asked, she also reports palpitations on and not for rhonchi and, more frequent in the last few days and not associated with chest pain, nausea; palpitations  described as a rapid heartbeat  PMH: reviewed FH: reviewed    Review of Systems Denies fevers but she feels cold all the time and slightly chilly. Mild headache No recent weight loss No GERD-type symptoms No actual wheezing. No lower extremity edema      Objective:   Physical Exam  Constitutional: She is oriented to person, place, and time. She appears well-developed and well-nourished. No distress.  HENT:  Head: Normocephalic and atraumatic.  Right Ear: External ear normal.  Left Ear: External ear normal.       Nose is slightly congested, face is symmetric and nontender to palpation  Cardiovascular: Normal rate, regular rhythm and normal heart sounds.   No murmur heard. Pulmonary/Chest: Effort normal and breath sounds normal. No respiratory distress. She has no wheezes. She has no rales.  Abdominal: Soft. She exhibits no distension. There is no tenderness. There is no rebound and no guarding.    Musculoskeletal: She exhibits no edema.  Neurological: She is alert and oriented to person, place, and time.  Skin: She is not diaphoretic.  Psychiatric: She has a normal mood and affect. Her behavior is normal. Judgment and thought content normal.          Assessment & Plan:  65 year old lady with a strong family history of heart disease, former smoker who presents with : 1. atypical chest pain 2. Chronic cough, recently started on asmanex w/o much help 3. Feeling worse this week: mild HA, sinus congestion, URI? 4. palpitations :Initially my nurse checked her pulse, it was 150;  I rechecked shortly after and it was normal.EKG today Chart reviewed: Negative stress test and a cardiac catheterization in 2010 normal  PFTs 05-2010 Last chest x-ray 03-2010 normal  Labs from 07/2010: slt low iron, normal TSH Plan: D/c asmanex Labs for evaluation of palpitations  Chest XR for evaluation of cough Mild bronchitis? URI? See instructions See cards tomorrow, ER if palpitations severe tonight

## 2010-12-31 ENCOUNTER — Encounter: Payer: Self-pay | Admitting: Cardiovascular Disease

## 2010-12-31 ENCOUNTER — Ambulatory Visit (INDEPENDENT_AMBULATORY_CARE_PROVIDER_SITE_OTHER): Payer: 59 | Admitting: Cardiovascular Disease

## 2010-12-31 VITALS — BP 90/62 | HR 61 | Ht 68.0 in | Wt 133.0 lb

## 2010-12-31 DIAGNOSIS — R072 Precordial pain: Secondary | ICD-10-CM

## 2010-12-31 DIAGNOSIS — I059 Rheumatic mitral valve disease, unspecified: Secondary | ICD-10-CM

## 2010-12-31 DIAGNOSIS — R002 Palpitations: Secondary | ICD-10-CM | POA: Insufficient documentation

## 2010-12-31 LAB — CBC WITH DIFFERENTIAL/PLATELET
Basophils Relative: 0.3 % (ref 0.0–3.0)
Eosinophils Relative: 1.5 % (ref 0.0–5.0)
HCT: 37.6 % (ref 36.0–46.0)
Hemoglobin: 12.5 g/dL (ref 12.0–15.0)
Lymphs Abs: 2.3 10*3/uL (ref 0.7–4.0)
MCV: 92.7 fl (ref 78.0–100.0)
Monocytes Absolute: 0.4 10*3/uL (ref 0.1–1.0)
Monocytes Relative: 4.7 % (ref 3.0–12.0)
Neutro Abs: 4.7 10*3/uL (ref 1.4–7.7)
RBC: 4.06 Mil/uL (ref 3.87–5.11)
WBC: 7.6 10*3/uL (ref 4.5–10.5)

## 2010-12-31 LAB — BASIC METABOLIC PANEL
BUN: 12 mg/dL (ref 6–23)
Chloride: 107 mEq/L (ref 96–112)
Potassium: 4.6 mEq/L (ref 3.5–5.1)
Sodium: 143 mEq/L (ref 135–145)

## 2010-12-31 NOTE — Progress Notes (Signed)
Patient ID: [name], female    DOB: 09/11/1945, 65 y.o.   MRN: [id number]  HPI Comments: 65 yo WF with history of hyperlipidemia,fibromyalgia, extensive GI disease including hiatal hernia, GERD, gastritis, Schatzkes ring s/p dilatations here today for follow up.   She has had previous episodes of heaviness in her chest. Cardiac catheterization in the past showed no significant disease.  She reports that she was at her primary care physician yesterday when the nurse recorded a heart rate of 150 beats per minute. She states that the nurse confirmed this by radial pulse. EKG did not suggest arrhythmia and heart rate was in the low 60s. On further questioning, she was not particularly symptomatic but does report having feelings of anxiety or agitation. She continues to have a nagging pain in her mediastinum that comes and goes, not associated with exertion. She attributes this to her GI issues. She is concerned about her mitral valve regurgitation. Last echocardiogram showed mild MR with no other significant abnormalities.  EKG today shows normal sinus rhythm with rate 61 beats per minute with no significant ST or T wave changes    Outpatient Encounter Prescriptions as of 12/31/2010  Medication Sig Dispense Refill  . b complex vitamins tablet Take 1 tablet by mouth daily.        [name] esomeprazole (NEXIUM) 20 MG capsule Take 20 mg by mouth 2 (two) times daily.        . Magnesium Oxide 250 MG TABS Take 250 mg by mouth daily.        . mometasone (NASONEX) 50 MCG/ACT nasal spray Place 2 sprays into the nose daily.        . Multiple Vitamin (MULTIVITAMIN) tablet Take 1 tablet by mouth daily.        . pravastatin (PRAVACHOL) 40 MG tablet Take 40 mg by mouth daily.        . Probiotic Product (ALIGN) 4 MG CAPS Take 4 mg by mouth daily.        . traZODone (DESYREL) 100 MG tablet Take 100 mg by mouth daily. Take 1-2 tablets        Review of Systems  Constitutional: Negative.   HENT:  Negative.   Eyes: Negative.   Respiratory: Negative.   Cardiovascular: Positive for chest pain and palpitations.  Gastrointestinal: Negative.   Musculoskeletal: Negative.   Skin: Negative.   Neurological: Negative.   Hematological: Negative.   Psychiatric/Behavioral: Negative.   All other systems reviewed and are negative.    BP 90/62  Pulse 61  Ht 5\' 8"  (1.727 m)  Wt 133 lb (60.328 kg)  BMI 20.22 kg/m2  Physical Exam  Nursing note and vitals reviewed. Constitutional: She is oriented to person, place, and time. She appears well-developed and well-nourished.  HENT:  Head: Normocephalic.  Nose: Nose normal.  Mouth/Throat: Oropharynx is clear and moist.  Eyes: Conjunctivae are normal. Pupils are equal, round, and reactive to light.  Neck: Normal range of motion. Neck supple. No JVD present.  Cardiovascular: Normal rate, regular rhythm, S1 normal, S2 normal, normal heart sounds and intact distal pulses.  Exam reveals no gallop and no friction rub.   No murmur heard. Pulmonary/Chest: Effort normal and breath sounds normal. No respiratory distress. She has no wheezes. She has no rales. She exhibits no tenderness.  Abdominal: Soft. Bowel sounds are normal. She exhibits no distension. There is no tenderness.  Musculoskeletal: Normal range of motion. She exhibits no edema and no tenderness.  Lymphadenopathy:  She has no cervical adenopathy.  Neurological: She is alert and oriented to person, place, and time. Coordination normal.  Skin: Skin is warm and dry. No rash noted. No erythema.  Psychiatric: Judgment and thought content normal.         Assessment and Plan

## 2010-12-31 NOTE — Assessment & Plan Note (Signed)
Noncardiac chest pain. Previous cardiac catheterization showing no significant disease. No further workup indicated.

## 2010-12-31 NOTE — Patient Instructions (Signed)
You are doing well. No medication changes were made. We have placed a holter  Monitor Please return this after 48 hrs. We will call you with the results.  Please call us if you have new issues that need to be addressed  Follow up as needed

## 2010-12-31 NOTE — Assessment & Plan Note (Signed)
She reports having a heart rate in the 150 range per the nursing at the PMD office yesterday.  Symptoms are somewhat vague. We have suggested that the artery did tell if she is having arrhythmia would be a Holter monitor. I suspect that the 150 beats per minute could have been a misread though unable to definitively exclude short period of arrhythmia that terminated prior to the EKG at Dr. Caryl Never office.

## 2010-12-31 NOTE — Assessment & Plan Note (Signed)
No significant mitral valve prolapse noted on her most recent echocardiogram. She does have mild MR. On clinical exam, no appreciable murmur. No further echocardiograms needed.

## 2011-01-02 ENCOUNTER — Telehealth: Payer: Self-pay

## 2011-01-02 NOTE — Telephone Encounter (Signed)
Message copied by Francisco Capuchin on Thu Jan 02, 2011  5:24 PM ------      Message from: Willow Ora E      Created: Thu Jan 02, 2011  1:51 PM       Advise patient      No anemia or thyroid disease that could explain her palpitations.      Blood sugar elevated, recommend a visit with  her PCP for a routine check.      Chest x-ray essentially normal, she did have some emphysema.

## 2011-01-02 NOTE — Telephone Encounter (Signed)
Advised patient  No anemia or thyroid disease that could explain her palpitations.  Blood sugar elevated, recommend a visit with her PCP for a routine check.  Chest x-ray essentially normal, she did have some emphysema

## 2011-01-06 ENCOUNTER — Ambulatory Visit (INDEPENDENT_AMBULATORY_CARE_PROVIDER_SITE_OTHER): Payer: 59 | Admitting: Internal Medicine

## 2011-01-06 ENCOUNTER — Encounter: Payer: Self-pay | Admitting: Internal Medicine

## 2011-01-06 ENCOUNTER — Institutional Professional Consult (permissible substitution): Payer: 59 | Admitting: Cardiovascular Disease

## 2011-01-06 VITALS — BP 112/70 | HR 62 | Temp 98.2°F | Wt 129.4 lb

## 2011-01-06 DIAGNOSIS — R059 Cough, unspecified: Secondary | ICD-10-CM

## 2011-01-06 DIAGNOSIS — R7309 Other abnormal glucose: Secondary | ICD-10-CM

## 2011-01-06 DIAGNOSIS — R05 Cough: Secondary | ICD-10-CM

## 2011-01-06 LAB — POCT URINALYSIS DIPSTICK

## 2011-01-06 NOTE — Patient Instructions (Addendum)
If you see the white collections in the back your throat; use a WaterPik to dislodge these. If left these may cause inflammation or halitosis.  You should consume less than 30 grams  of sugar per day from foods and drinks with high fructose corn syrup as number1,  2, 3, or #4 on the label.

## 2011-01-06 NOTE — Progress Notes (Signed)
Subjective:    Patient ID: [name], female    DOB: Jul 12, 1945, 65 y.o.   MRN: [id number]  HPI "Blood Sugar Issues" Fasting or morning glucose range:  Not monitored ; FBS was 120 last week                                                 Excess thirst :yes;  Excess hunger:  no ;  Excess urination:  Yes with nocturia > 3X/ night for @ least 12 months.                                  Lightheadedness with standing:  no. Chest pain:  Dr Windell Hummingbird Cardiolgy  notes reviewed ; Palpitations :no ;  Pain in  calves with walking:  no .                                                                                                                                 Non healing skin  ulcers or sores,especially over the feet:  no. 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                                                                                                                                             Significant change in  Weight : no. Vision changes : some visual changes; early cataracts diagnosed by Ophth.                                                              Nutrition/diet:  No specific diet. Foot care : no; but seen by Neurologist.  A1c/ urine microalbumin monitor:  To be drawn     Chest x-ray 03/20/2010 reviewed. He was diagnosed as having COPD. She has a classic habitus which will cause a  diagnosis of emphysema, i.e. tall and thin.    Review of Systems  She is noted white lesions in the posterior pharynx.     Objective:   Physical Exam   She is thin but appears well nourished  No pharyngeal lesions are noted today  She is a regular rhythm with normal rate. I do not appreciate a click today.  Chest is clear with no increased work of breathing, rhonchi, rales, or wheezing.  She has no lymphadenopathy about the neck or axilla.  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

## 2011-01-06 NOTE — Progress Notes (Signed)
Addended byMarga Melnick F on: 01/06/2011 12:31 PM   Modules accepted: Orders

## 2011-01-07 ENCOUNTER — Telehealth: Payer: Self-pay | Admitting: *Deleted

## 2011-01-07 NOTE — Telephone Encounter (Signed)
Her BP is very low and would make it difficult to start B-blocker or diltiazem We could potentially start flecainide 50 mg BID for arrhythmia (SVT) Otherwise we may need florinef to push BP upwards, then metoprolol or diltiazem

## 2011-01-07 NOTE — Telephone Encounter (Signed)
Notified pt of Holter results, per Dr. Mariah Milling, NSR with rare episodes of SVT, longest 9 beats, rare APC's. Pt does say she still has "funny feelings in chest at times," and c/o dizziness. Pt does not know what she wants to do, she wants to see what TG recommends. Advised pt she also f/u with pcp, and she says she has seen so many doctors recently she is hesitant to have another f/u at this time. Please advise. Will scan holter now.

## 2011-01-08 NOTE — Telephone Encounter (Signed)
Notified pt of below recc. Told pt if she wants to discuss options we can schedule f/u as she does not know what she would like to do at this time. Pt states she would like to discuss with Dr. Alwyn Ren and she will call back for f/u if needed.

## 2011-01-13 ENCOUNTER — Encounter: Payer: Self-pay | Admitting: Internal Medicine

## 2011-01-13 ENCOUNTER — Telehealth: Payer: Self-pay | Admitting: Internal Medicine

## 2011-01-13 ENCOUNTER — Ambulatory Visit (INDEPENDENT_AMBULATORY_CARE_PROVIDER_SITE_OTHER): Payer: 59 | Admitting: Internal Medicine

## 2011-01-13 VITALS — BP 100/70 | HR 76 | Ht 68.0 in | Wt 131.0 lb

## 2011-01-13 DIAGNOSIS — E785 Hyperlipidemia, unspecified: Secondary | ICD-10-CM

## 2011-01-13 DIAGNOSIS — Z23 Encounter for immunization: Secondary | ICD-10-CM

## 2011-01-13 DIAGNOSIS — Z2911 Encounter for prophylactic immunotherapy for respiratory syncytial virus (RSV): Secondary | ICD-10-CM

## 2011-01-13 MED ORDER — NIACIN ER (ANTIHYPERLIPIDEMIC) 500 MG PO TBCR: 500.0000 mg | EXTENDED_RELEASE_TABLET | Freq: Every day | ORAL | Status: DC

## 2011-01-13 NOTE — Telephone Encounter (Signed)
Per Renee Pt appt has been schedule will call Pt with info today.

## 2011-01-13 NOTE — Progress Notes (Signed)
Addended by: Edgardo Roys on: 01/13/2011 02:49 PM   Modules accepted: Orders

## 2011-01-13 NOTE — Progress Notes (Signed)
  Subjective:    Patient ID: [name], female    DOB: Oct 25, 1945, 65 y.o.   MRN: [id number]  HPI Both the NMR lipoprotein & the [facility] panel were reviewed in detail. She does have a strong family history of coronary disease among the paternal family members. Her father had a heart attack in his 30s and all 7 of this brothers had coronary disease.  Her LDL goal would be less than 145; ideally less than 110. 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 At this time her LDL is excellent. 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  Chest is clear with no rhonchi, rales or wheezes.  She has an S4 ;no significant murmur.  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  #3 minimal elevation in urine microalbumin with normal blood pressure and normal A1c. No intervention needed except to stay well hydrated.  Plan: See orders and recommendations

## 2011-01-13 NOTE — Patient Instructions (Signed)
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 The only option to treat this would be niacin. This B vitamin is a vasodilator and would be expected to open up the blood vessels and cause flushing. This can be prevented by taking it with several tablespoons of apple sauce. The pectin in applesauce blocks the flushing. There are prescription forms of niacin; check with your insurance company to see whether this (Niaspan) is option based on cost  & coverage.

## 2011-01-16 ENCOUNTER — Institutional Professional Consult (permissible substitution): Payer: 59 | Admitting: Internal Medicine

## 2011-01-17 ENCOUNTER — Other Ambulatory Visit: Payer: Self-pay | Admitting: Internal Medicine

## 2011-01-17 MED ORDER — PRAVASTATIN SODIUM 40 MG PO TABS: 40.0000 mg | ORAL_TABLET | Freq: Every day | ORAL | Status: DC

## 2011-01-17 NOTE — Telephone Encounter (Signed)
Rx sent 

## 2011-01-27 ENCOUNTER — Ambulatory Visit (INDEPENDENT_AMBULATORY_CARE_PROVIDER_SITE_OTHER): Payer: 59 | Admitting: Cardiovascular Disease

## 2011-01-27 ENCOUNTER — Encounter: Payer: Self-pay | Admitting: Cardiovascular Disease

## 2011-01-27 VITALS — BP 100/68 | HR 61 | Ht 68.0 in | Wt 134.2 lb

## 2011-01-27 DIAGNOSIS — E785 Hyperlipidemia, unspecified: Secondary | ICD-10-CM

## 2011-01-27 DIAGNOSIS — R002 Palpitations: Secondary | ICD-10-CM

## 2011-01-27 MED ORDER — NIACIN ER (ANTIHYPERLIPIDEMIC) 1000 MG PO TBCR: 1000.0000 mg | EXTENDED_RELEASE_TABLET | Freq: Every day | ORAL | Status: DC

## 2011-01-27 MED ORDER — ATORVASTATIN CALCIUM 40 MG PO TABS: 40.0000 mg | ORAL_TABLET | Freq: Every day | ORAL | Status: DC

## 2011-01-27 NOTE — Patient Instructions (Addendum)
You are doing well.  Please change to lipitor 40mg  daily when you are done with pravastatin  Please start niaspan 100 mg daily.  Call the office if you would like a 30 day monitor  Please call us if you have new issues that need to be addressed before your next appt.  The office will contact you for a follow up Appt. In 6 months

## 2011-01-27 NOTE — Assessment & Plan Note (Signed)
Holter monitor confirms very short runs of SVT. Uncertain if she does have longer runs of arrhythmia at other times. She is interested in a 30 day monitor though she would like to do this after the holidays. We have suggested she call us when she would like to have this done.

## 2011-01-27 NOTE — Progress Notes (Signed)
Patient ID: [name], female    DOB: Sep 19, 1945, 65 y.o.   MRN: [id number]  HPI Comments: 65 yo WF with history of hyperlipidemia,fibromyalgia, extensive GI disease including hiatal hernia, GERD, gastritis, Schatzkes ring s/p dilatations here today for follow up.   She has had previous episodes of heaviness in her chest. Cardiac catheterization in the past showed no significant disease. Holter monitor showed short runs of SVT. 48-hour study. She does continue to have episodes of shortness of breath concerning for longer runs of SVT.  Initially seen by her primary care physician, heart rate of 150 beats per minute.  EKG did not suggest arrhythmia and heart rate was in the low 60s. On further questioning, she was not particularly symptomatic but does report having feelings of anxiety or agitation. She continues to have a nagging pain in her mediastinum that comes and goes, not associated with exertion. She attributes this to her GI issues.  She has been taking over-the-counter niacin.   Last echocardiogram showed mild MR with no other significant abnormalities. Strong family history of coronary artery disease.  EKG today shows normal sinus rhythm with rate 63 beats per minute with no significant ST or T wave changes    Outpatient Encounter Prescriptions as of 01/27/2011  Medication Sig Dispense Refill  . b complex vitamins tablet Take 1 tablet by mouth daily. B right  Once daily      . Biotin 1000 MCG tablet Take 1,000 mcg by mouth daily.        [name] esomeprazole (NEXIUM) 40 MG capsule Take 40 mg by mouth 2 (two) times daily.        . Magnesium Oxide 250 MG TABS Take 250 mg by mouth daily. 1-2 by mouth daily      . Multiple Vitamin (MULTIVITAMIN) tablet Take 1 tablet by mouth daily.        . niacin (NIASPAN) 500 MG CR tablet Take 1 tablet (500 mg total) by mouth at bedtime. Take with applesauce  30 tablet  5  . Probiotic Product (ALIGN) 4 MG CAPS Take 4 mg by mouth daily.         . Selenium 200 MCG TABS Take by mouth daily.        . Sucralfate (CARAFATE PO) Take by mouth 3 (three) times daily.        . traZODone (DESYREL) 100 MG tablet Take 100 mg by mouth at bedtime. Take 1-2 tablets      .  pravastatin (PRAVACHOL) 40 MG tablet Take 1 tablet (40 mg total) by mouth daily.  90 tablet  3    Review of Systems  Constitutional: Negative.   HENT: Negative.   Eyes: Negative.   Respiratory: Positive for shortness of breath.   Cardiovascular: Positive for palpitations.  Gastrointestinal: Negative.   Musculoskeletal: Negative.   Skin: Negative.   Neurological: Negative.   Hematological: Negative.   Psychiatric/Behavioral: Negative.   All other systems reviewed and are negative.    BP 100/68  Pulse 61  Ht 5\' 8"  (1.727 m)  Wt 134 lb 4 oz (60.895 kg)  BMI 20.41 kg/m2  Physical Exam  Nursing note and vitals reviewed. Constitutional: She is oriented to person, place, and time. She appears well-developed and well-nourished.  HENT:  Head: Normocephalic.  Nose: Nose normal.  Mouth/Throat: Oropharynx is clear and moist.  Eyes: Conjunctivae are normal. Pupils are equal, round, and reactive to light.  Neck: Normal range of motion. Neck supple. No JVD present.  Cardiovascular: Normal rate, regular rhythm, S1 normal, S2 normal, normal heart sounds and intact distal pulses.  Exam reveals no gallop and no friction rub.   No murmur heard. Pulmonary/Chest: Effort normal and breath sounds normal. No respiratory distress. She has no wheezes. She has no rales. She exhibits no tenderness.  Abdominal: Soft. Bowel sounds are normal. She exhibits no distension. There is no tenderness.  Musculoskeletal: Normal range of motion. She exhibits no edema and no tenderness.  Lymphadenopathy:    She has no cervical adenopathy.  Neurological: She is alert and oriented to person, place, and time. Coordination normal.  Skin: Skin is warm and dry. No rash noted. No erythema.  Psychiatric:  She has a normal mood and affect. Her behavior is normal. Judgment and thought content normal.         Assessment and Plan

## 2011-01-27 NOTE — Assessment & Plan Note (Signed)
We have reviewed her lipid panel. 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 This is concerning given her strong family history of coronary artery disease. She has indicated she would like a more aggressive regimen for her cholesterol. We will change her cholesterol medication to Lipitor 40 mg daily. We will also increase the Niaspan to 1000 mg daily.

## 2011-01-28 ENCOUNTER — Ambulatory Visit: Payer: 59 | Admitting: Cardiovascular Disease

## 2011-02-06 ENCOUNTER — Telehealth: Payer: Self-pay | Admitting: Internal Medicine

## 2011-02-06 NOTE — Telephone Encounter (Signed)
Patient hit top of head on a door -tues [phone] - her neck,top of head back of head & forehead are hurting - she said she hit it really hard - she wanted to know if she needed to see dr hopper or would he refer her

## 2011-02-06 NOTE — Telephone Encounter (Signed)
Pt scheduled to come in for OV.

## 2011-02-07 ENCOUNTER — Ambulatory Visit (HOSPITAL_COMMUNITY)
Admission: RE | Admit: 2011-02-07 | Discharge: 2011-02-07 | Disposition: A | Discharge status: Home / Self Care | Payer: 59 | Source: Ambulatory Visit | Attending: Family Medicine | Admitting: Family Medicine

## 2011-02-07 ENCOUNTER — Ambulatory Visit (INDEPENDENT_AMBULATORY_CARE_PROVIDER_SITE_OTHER): Payer: 59 | Admitting: Family Medicine

## 2011-02-07 ENCOUNTER — Encounter: Payer: Self-pay | Admitting: Family Medicine

## 2011-02-07 VITALS — BP 114/70 | HR 66 | Temp 97.9°F | Wt 134.6 lb

## 2011-02-07 DIAGNOSIS — S060X9A Concussion with loss of consciousness of unspecified duration, initial encounter: Secondary | ICD-10-CM

## 2011-02-07 DIAGNOSIS — R51 Headache: Secondary | ICD-10-CM | POA: Insufficient documentation

## 2011-02-07 DIAGNOSIS — E785 Hyperlipidemia, unspecified: Secondary | ICD-10-CM

## 2011-02-07 DIAGNOSIS — G319 Degenerative disease of nervous system, unspecified: Secondary | ICD-10-CM | POA: Insufficient documentation

## 2011-02-07 DIAGNOSIS — R55 Syncope and collapse: Secondary | ICD-10-CM | POA: Insufficient documentation

## 2011-02-07 LAB — LIPID PANEL
HDL: 68.6 mg/dL (ref >39.00)
Total CHOL/HDL Ratio: 2

## 2011-02-07 LAB — HEPATIC FUNCTION PANEL
ALT: 23 U/L (ref 0–35)
AST: 29 U/L (ref 0–37)
Alkaline Phosphatase: 58 U/L (ref 39–117)
Bilirubin, Direct: 0.1 mg/dL (ref 0.0–0.3)
Total Protein: 6.7 g/dL (ref 6.0–8.3)

## 2011-02-07 NOTE — Progress Notes (Signed)
  Subjective:    [name] is a 65 y.o. female who presents for evaluation of headache. Symptoms began about 5 days ago. Generally, the headaches last about most of the day.  - and occur daily. The headaches do not seem to be related to any time of the day. The headaches are usually moderate and are located in top and back of skull..  The patient rates her most severe headaches a 5 on a scale from 1 to 10. Recently, the headaches have been stable. Work attendance or other daily activities are not affected by the headaches. Precipitating factors include: none which have been determined. The headaches are usually not preceded by an aura. Associated neurologic symptoms: decreased physical activity. The patient denies decreased physical activity. Home treatment has included nothing with little improvement. Other history includes: nothing pertinent. Family history includes no known family members with significant headaches.  The following portions of the patient's history were reviewed and updated as appropriate: allergies, past family history, past medical history, past social history, past surgical history and problem list.  Review of Systems Pertinent items are noted in HPI.    Objective:    BP 114/70  Pulse 66  Temp(Src) 97.9 F (36.6 C) (Oral)  Wt 134 lb 9.6 oz (61.054 kg)  SpO2 99% General appearance: AAOx3  NAD Eyes: Perla, eomi Ears: TMI,  Canal clear Nose: turb pink, and moist Throat: no errythema, or exudates Neck: supple ,  No adenopathy Lungs: ctab/l Heart: S1, S2 no murmur   Assessment:    head injury---prob concussion   Plan:    check CT Rest,  Ice alt with heat prn nsaid prn  Call or rto prn

## 2011-02-07 NOTE — Patient Instructions (Signed)
Concussion and Brain Injury     A blow or jolt to the head can disrupt the normal function of the brain. This type of brain injury is often called a "concussion" or a "closed head injury." Concussions are usually not life-threatening. Even so, the effects of a concussion can be serious.   CAUSES   A concussion is caused by a blunt blow to the head. The blow might be direct or indirect as described below.  · Direct blow (running into another player during a soccer game, being hit in a fight, or hitting your head on a hard surface).   · Indirect blow (when your head moves rapidly and violently back and forth like in a car crash).   SYMPTOMS   The brain is very complex. Every head injury is different. Some symptoms may appear right away. Other symptoms may not show up for days or weeks after the concussion. The signs of concussion can be hard to notice. Early on, problems may be missed by patients, family members, and caregivers. You may look fine even though you are acting or feeling differently.   These symptoms are usually temporary, but may last for days, weeks, or even longer. Symptoms include:  · Mild headaches that will not go away.   · Having more trouble than usual with:   · Remembering things.   · Paying attention or concentrating.   · Organizing daily tasks.   · Making decisions and solving problems.   · Slowness in thinking, acting, speaking, or reading.   · Getting lost or easily confused.   · Feeling tired all the time or lacking energy (fatigue).   · Feeling drowsy.   · Sleep disturbances.   · Sleeping more than usual.   · Sleeping less than usual.   · Trouble falling asleep.   · Trouble sleeping (insomnia).   · Loss of balance or feeling lightheaded or dizzy.   · Nausea or vomiting.   · Numbness or tingling.   · Increased sensitivity to:   · Sounds.   · Lights.   · Distractions.   Other symptoms might include:  · Vision problems or eyes that tire easily.   · Diminished sense of taste or smell.    · Ringing in the ears.   · Mood changes such as feeling sad, anxious, or listless.   · Becoming easily irritated or angry for little or no reason.   · Lack of motivation.   DIAGNOSIS   Your caregiver can usually diagnose a concussion or mild brain injury based on your description of your injury and your symptoms.   Your evaluation might include:  · A brain scan to look for signs of injury to the brain. Even if the test shows no injury, you may still have a concussion.   · Blood tests to be sure other problems are not present.   TREATMENT   · People with a concussion need to be examined and evaluated. Most people with concussions are treated in an emergency department, urgent care, or clinic. Some people must stay in the hospital overnight for further treatment.   · Your caregiver will send you home with important instructions to follow. Be sure to carefully follow them.   · Tell your caregiver if you are already taking any medicines (prescription, over-the-counter, or natural remedies), or if you are drinking alcohol or taking illegal drugs. Also, talk with your caregiver if you are taking blood thinners (anticoagulants) or aspirin. These drugs   may increase your chances of complications. All of this is important information that may affect treatment.   · Only take over-the-counter or prescription medicines for pain, discomfort, or fever as directed by your caregiver.   PROGNOSIS   How fast people recover from brain injury varies from person to person. Although most people have a good recovery, how quickly they improve depends on many factors. These factors include how severe their concussion was, what part of the brain was injured, their age, and how healthy they were before the concussion.   Because all head injuries are different, so is recovery. Most people with mild injuries recover fully. Recovery can take time. In general, recovery is slower in older persons. Also, persons who have had a concussion in the  past or have other medical problems may find that it takes longer to recover from their current injury. Anxiety and depression may also make it harder to adjust to the symptoms of brain injury.  HOME CARE INSTRUCTIONS   Return to your normal activities slowly, not all at once. You must give your body and brain enough time for recovery.  · Get plenty of sleep at night, and rest during the day. Rest helps the brain to heal.   · Avoid staying up late at night.   · Keep the same bedtime hours on weekends and weekdays.   · Take daytime naps or rest breaks when you feel tired.   · Limit activities that require a lot of thought or concentration (brain or cognitive rest). This includes:   · Homework or job-related work.   · Watching TV.   · Computer work.   · Avoid activities that could lead to a second brain injury, such as contact or recreational sports, until your caregiver says it is okay. Even after your brain injury has healed, you should protect yourself from having another concussion.   · Ask your caregiver when you can return to your normal activities such as driving, bicycling, or operating heavy equipment. Your ability to react may be slower after a brain injury.   · Talk with your caregiver about when you can return to work or school.   · Inform your teachers, school nurse, school counselor, coach, athletic trainer, or work manager about your injury, symptoms, and restrictions. They should be instructed to report:   · Increased problems with attention or concentration.   · Increased problems remembering or learning new information.   · Increased time needed to complete tasks or assignments.   · Increased irritability or decreased ability to cope with stress.   · Increased symptoms.   · Take only those medicines that your caregiver has approved.   · Do not drink alcohol until your caregiver says you are well enough to do so. Alcohol and certain other drugs may slow your recovery and can put you at risk of further  injury.   · If it is harder than usual to remember things, write them down.   · If you are easily distracted, try to do one thing at a time. For example, do not try to watch TV while fixing dinner.   · Talk with family members or close friends when making important decisions.   · Keep all follow-up appointments. Repeated evaluation of your symptoms is recommended for your recovery.   PREVENTION   Protect your head from future injury. It is very important to avoid another head or brain injury before you have recovered. In rare cases, another injury   has lead to permanent brain damage, brain swelling, or death. Avoid injuries by using:  · Seatbelts when riding in a car.   · Alcohol only in moderation.   · A helmet when biking, skiing, skateboarding, skating, or doing similar activities.   · Safety measures in your home.   · Remove clutter and tripping hazards from floors and stairways.   · Use grab bars in bathrooms and handrails by stairs.   · Place non-slip mats on floors and in bathtubs.   · Improve lighting in dim areas.   SEEK MEDICAL CARE IF:   A head injury can cause lingering symptoms. You should seek medical care if you have any of the following symptoms for more than 3 weeks after your injury or are planning to return to sports:  · Chronic headaches.   · Dizziness or balance problems.   · Nausea.   · Vision problems.   · Increased sensitivity to noise or light.   · Depression or mood swings.   · Anxiety or irritability.   · Memory problems.   · Difficulty concentrating or paying attention.   · Sleep problems.   · Feeling tired all the time.   SEEK IMMEDIATE MEDICAL CARE IF:   You have had a blow or jolt to the head and you (or your family or friends) notice:  · Severe or worsening headaches.   · Weakness (even if only in one hand or one leg or one part of the face), numbness, or decreased coordination.   · Repeated vomiting.   · Increased sleepiness or passing out.   · One black center of the eye (pupil) is  larger than the other.   · Convulsions (seizures).   · Slurred speech.   · Increasing confusion, restlessness, agitation, or irritability.   · Lack of ability to recognize people or places.   · Neck pain.   · Difficulty being awakened.   · Unusual behavior changes.   · Loss of consciousness.   Older adults with a brain injury may have a higher risk of serious complications such as a blood clot on the brain. Headaches that get worse or an increase in confusion are signs of this complication. If these signs occur, see a caregiver right away.  MAKE SURE YOU:   · Understand these instructions.   · Will watch your condition.   · Will get help right away if you are not doing well or get worse.   FOR MORE INFORMATION   Several groups help people with brain injury and their families. They provide information and put people in touch with local resources. These include support groups, rehabilitation services, and a variety of health care professionals. Among these groups, the Brain Injury Association (BIA, www.biausa.org) has a national office that gathers scientific and educational information and works on a national level to help people with brain injury.   Document Released: 05/10/2003 Document Revised: 10/30/2010 Document Reviewed: 10/06/2007  [employer]® Patient Information ©2012 [employer].

## 2011-02-13 ENCOUNTER — Encounter: Payer: Self-pay | Admitting: Internal Medicine

## 2011-02-13 ENCOUNTER — Ambulatory Visit (INDEPENDENT_AMBULATORY_CARE_PROVIDER_SITE_OTHER): Payer: 59 | Admitting: Internal Medicine

## 2011-02-13 DIAGNOSIS — R05 Cough: Secondary | ICD-10-CM | POA: Insufficient documentation

## 2011-02-13 DIAGNOSIS — R0989 Other specified symptoms and signs involving the circulatory and respiratory systems: Secondary | ICD-10-CM

## 2011-02-13 DIAGNOSIS — R059 Cough, unspecified: Secondary | ICD-10-CM

## 2011-02-13 DIAGNOSIS — R0609 Other forms of dyspnea: Secondary | ICD-10-CM

## 2011-02-13 DIAGNOSIS — R053 Chronic cough: Secondary | ICD-10-CM | POA: Insufficient documentation

## 2011-02-13 MED ORDER — ESOMEPRAZOLE MAGNESIUM 40 MG PO CPDR: DELAYED_RELEASE_CAPSULE | ORAL | Status: DC

## 2011-02-13 NOTE — Patient Instructions (Signed)
GERD (REFLUX)  is an extremely common cause of respiratory symptoms, many times with no significant heartburn at all.    It can be treated with medication, but also with lifestyle changes including avoidance of late meals, excessive alcohol, smoking cessation, and avoid fatty foods, chocolate, peppermint, colas, red wine, and acidic juices such as orange juice.  NO MINT OR MENTHOL PRODUCTS SO NO COUGH DROPS  USE SUGARLESS CANDY INSTEAD (jolley ranchers or Stover's)  NO OIL BASED VITAMINS - use powdered substitutes.    Pulmonary follow up is as needed - if short of breath is associated with hoarseness, this would indicate a throat problem likely from reflux

## 2011-02-13 NOTE — Assessment & Plan Note (Addendum)
The most common causes of chronic cough in immunocompetent adults include the following: upper airway cough syndrome (UACS), previously referred to as postnasal drip syndrome (PNDS), which is caused by variety of rhinosinus conditions; (2) asthma; (3) GERD; (4) chronic bronchitis from cigarette smoking or other inhaled environmental irritants; (5) nonasthmatic eosinophilic bronchitis; and (6) bronchiectasis.   These conditions, singly or in combination, have accounted for up to 94% of the causes of chronic cough in prospective studies.   Other conditions have constituted no >6% of the causes in prospective studies These have included bronchogenic carcinoma, chronic interstitial pneumonia, sarcoidosis, left ventricular failure, ACEI-induced cough, and aspiration from a condition associated with pharyngeal dysfunction.   Chronic cough is often simultaneously caused by more than one condition. A single cause has been found from 38 to 82% of the time, multiple causes from 18 to 62%. Multiply caused cough has been the result of three diseases up to 42% of the time.   She does not have significant copd by pft's and the cough does not exac with uri's or in am's typically so don't feel CB likely at all  I think it's pretty clear the cough and GERD are directly related given the experience of improvement with elevation of HOB and assoc of sob and hoarseness.  Rec continue max rx with PPI and diet, f/u here prn with consideration for sinus ct/ ENT eval if not improving to her satisfaction as the next logical steps

## 2011-02-13 NOTE — Progress Notes (Signed)
Subjective:     Patient ID: [name], female   DOB: 1945-05-27, 65 y.o.   MRN: [id number]  HPI  60 yowf quit smoking 2006 with h/o GERD and cough starting around 2008 present qhs referred by Dr Alwyn Ren. PFT's were nl 05/10/2010    02/13/2011 1st pulmonary eval cc indolent onset persistent mostly  dry cough x about 4 years, present qhs without fail  But consistently better if elevates head of bed,  Not much of a problem during the day. Good ex tolerance occ sob with hoarsness.  No h/o allergies or asthma or tendency to bad sinus infections. No better on symbicort trial.   After gets to sleep  Does ok without nocturnal  or early am exacerbation  of respiratory  c/o's or need for noct saba. Also denies any obvious fluctuation of symptoms with weather or environmental changes or other aggravating or alleviating factors except as outlined above    ROS  At present neg for  any significant sore throat, dysphagia, itching, sneezing,  nasal congestion or excess/ purulent secretions,  fever, chills, sweats, unintended wt loss, pleuritic or exertional cp, hempoptysis, orthopnea pnd or leg swelling.  Also denies presyncope, palpitations, heartburn, abdominal pain, nausea, vomiting, diarrhea  or change in bowel or urinary habits, dysuria,hematuria,  rash, arthralgias, visual complaints, headache, numbness weakness or ataxia.       Review of Systems  Constitutional: Negative for fever, chills and unexpected weight change.  HENT: Negative for ear pain, nosebleeds, congestion, sore throat, rhinorrhea, sneezing, trouble swallowing, dental problem, voice change, postnasal drip and sinus pressure.   Eyes: Negative for visual disturbance.  Respiratory: Positive for cough. Negative for choking and shortness of breath.   Cardiovascular: Negative for chest pain and leg swelling.  Gastrointestinal: Negative for vomiting, abdominal pain and diarrhea.  Genitourinary: Negative for difficulty  urinating.  Musculoskeletal: Negative for arthralgias.  Skin: Negative for rash.  Neurological: Negative for tremors, syncope and headaches.  Hematological: Does not bruise/bleed easily.       Objective:   Physical Exam    amb anxious wf nad  Wt 135  02/13/2011   HEENT: nl dentition, turbinates, and orophanx. Nl external ear canals without cough reflex   NECK :  without JVD/Nodes/TM/ nl carotid upstrokes bilaterally   LUNGS: no acc muscle use, clear to A and P bilaterally without cough on insp or exp maneuvers   CV:  RRR  no s3 or murmur or increase in P2, no edema   ABD:  soft and nontender with nl excursion in the supine position. No bruits or organomegaly, bowel sounds nl  MS:  warm without deformities, calf tenderness, cyanosis or clubbing  SKIN: warm and dry without lesions    NEURO:  alert, approp, no deficits    12/30/10 cxr reviewed report: COPD. No acute cardiopulmonary disease.    Assessment:         Plan:

## 2011-03-11 ENCOUNTER — Telehealth: Payer: Self-pay

## 2011-03-11 ENCOUNTER — Telehealth: Payer: Self-pay | Admitting: Internal Medicine

## 2011-03-11 NOTE — Telephone Encounter (Signed)
This is something the neurosurgeon would not treat; who is her neurologist

## 2011-03-11 NOTE — Telephone Encounter (Signed)
Pt called and states she was seen on 02/07/11 for a concussion.  Pt states she had a brain scan and it came back normal.  Pt states since her concussion she has had headaches. Pt denies nausea or vomiting. Pt states she does have a neurologist.  Pt advised to make an appt with neurology first and if she cannot get an appt call office back.  Pt aware.

## 2011-03-11 NOTE — Telephone Encounter (Signed)
Patient states that she did call her neurologist but that she needs a referral

## 2011-03-11 NOTE — Telephone Encounter (Signed)
Pt has not seen neurologist in a while.  Would you prefer she come see you first or would you do a referral to neurology for pt. Pt is having headaches from concussion back in Dec 2012.   Pls advise.

## 2011-03-12 ENCOUNTER — Other Ambulatory Visit: Payer: Self-pay | Admitting: Internal Medicine

## 2011-03-12 NOTE — Telephone Encounter (Signed)
Pt states that her former Neurologist moved out of town and she has seen another within that practice but she now would like a referral to a Marketing executive; whomever you recommend. Informed pt of referral process.

## 2011-03-12 NOTE — Telephone Encounter (Signed)
Referral placed by Dr Hopper 

## 2011-03-13 ENCOUNTER — Encounter: Payer: Self-pay | Admitting: Neurology

## 2011-04-14 ENCOUNTER — Telehealth: Payer: Self-pay | Admitting: Gastroenterology

## 2011-04-14 NOTE — Telephone Encounter (Signed)
Pt with HX of IBS, GERD, Depression with Anxiety, Diarrhea, LLQ Pain, Diverticulosis. 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 She was referred to Dr Rudell Cobb at [facility] after a normal 24hr PH study and ES. 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 If ineffective, she was to go back for abdominal wall trigger point injections. We have not heard from her in several months. Today, pt reports diarrhea since Friday and LLQ pain in her abdomen and stomach. She reports whatever she eats runs thru her. When asked about a fever, she reports chills. Pt denies any blood in her stool. Discussed with Dr Jarold Motto and pt will come in tomorrow at 1100a. Discussed BRAT diet with pt and asked her to stop all dairy products; pt stated understanding.

## 2011-04-15 ENCOUNTER — Other Ambulatory Visit (INDEPENDENT_AMBULATORY_CARE_PROVIDER_SITE_OTHER): Payer: 59

## 2011-04-15 ENCOUNTER — Ambulatory Visit (INDEPENDENT_AMBULATORY_CARE_PROVIDER_SITE_OTHER): Payer: 59 | Admitting: Gastroenterology

## 2011-04-15 ENCOUNTER — Encounter: Payer: Self-pay | Admitting: Gastroenterology

## 2011-04-15 ENCOUNTER — Telehealth: Payer: Self-pay | Admitting: *Deleted

## 2011-04-15 VITALS — BP 108/64 | HR 80 | Ht 68.0 in | Wt 131.0 lb

## 2011-04-15 DIAGNOSIS — R197 Diarrhea, unspecified: Secondary | ICD-10-CM

## 2011-04-15 LAB — FOLATE: Folate: >24.8 ng/mL (ref >5.9)

## 2011-04-15 LAB — IBC PANEL
Iron: 82 ug/dL (ref 42–145)
Saturation Ratios: 22.6 % (ref 20.0–50.0)
Transferrin: 258.7 mg/dL (ref 212.0–360.0)

## 2011-04-15 LAB — HEPATIC FUNCTION PANEL
ALT: 21 U/L (ref 0–35)
Bilirubin, Direct: 0.1 mg/dL (ref 0.0–0.3)
Total Bilirubin: 0.6 mg/dL (ref 0.3–1.2)
Total Protein: 6.6 g/dL (ref 6.0–8.3)

## 2011-04-15 LAB — CBC WITH DIFFERENTIAL/PLATELET
Basophils Relative: 0.6 % (ref 0.0–3.0)
Eosinophils Absolute: 0.1 10*3/uL (ref 0.0–0.7)
Eosinophils Relative: 1.7 % (ref 0.0–5.0)
Hemoglobin: 13.1 g/dL (ref 12.0–15.0)
Lymphocytes Relative: 23.1 % (ref 12.0–46.0)
MCHC: 32.9 g/dL (ref 30.0–36.0)
Neutro Abs: 3.7 10*3/uL (ref 1.4–7.7)
Neutrophils Relative %: 66.7 % (ref 43.0–77.0)
RBC: 4.36 Mil/uL (ref 3.87–5.11)
WBC: 5.6 10*3/uL (ref 4.5–10.5)

## 2011-04-15 LAB — BASIC METABOLIC PANEL
Calcium: 9.1 mg/dL (ref 8.4–10.5)
Creatinine, Ser: 0.7 mg/dL (ref 0.4–1.2)
GFR: 96.59 mL/min (ref >60.00)
Glucose, Bld: 92 mg/dL (ref 70–99)
Sodium: 139 mEq/L (ref 135–145)

## 2011-04-15 LAB — SEDIMENTATION RATE: Sed Rate: 10 mm/hr (ref 0–22)

## 2011-04-15 LAB — VITAMIN B12: Vitamin B-12: 545 pg/mL (ref 211–911)

## 2011-04-15 MED ORDER — HYOSCYAMINE-PHENYLTOLOXAMINE 0.0625-15 MG PO CAPS: ORAL_CAPSULE | ORAL | Status: DC

## 2011-04-15 NOTE — Telephone Encounter (Signed)
Called patient to see if she could move to an earlier appointment (11:30am on 04/16/11). She is unable to move and will keep her 13:30 appointment time.

## 2011-04-15 NOTE — Patient Instructions (Addendum)
Your procedure has been scheduled for 04/16/2011, please follow the seperate instructions.  Please go to the basement today for your labs.  Take Digex as needed for abdominal cramping, samples given.

## 2011-04-15 NOTE — Progress Notes (Signed)
This is a complicated and complex 66 year old Caucasian female with recurrent GI problems of unexplained etiology despite referral to [facility] for a second opinion one year ago. It was their feeling that she had post infectious IBS, and the patient's diarrhea did eventually resolve. She has been asymptomatic for the last several months, but now relates 5 days of vague lower gum discomfort with loose, watery, nonbloody diarrhea but no systemic complaints, fever, chills, known infectious disease exposure, sick family members, and she vehemently denies antibiotic use. She is on Nexium 40 mg a day for GERD.  Current Medications, Allergies, Past Medical History, Past Surgical History, Family History and Social History were reviewed in [employer] record.  Pertinent Review of Systems Negative  No Known Allergies Outpatient Prescriptions Prior to Visit  Medication Sig Dispense Refill  . atorvastatin (LIPITOR) 40 MG tablet Take 1 tablet (40 mg total) by mouth daily.  30 tablet  6  . b complex vitamins tablet Take 1 tablet by mouth daily. B right  Once daily      . Biotin 1000 MCG tablet Take 1,000 mcg by mouth daily.        [name] esomeprazole (NEXIUM) 40 MG capsule Take 30- 60 min before your first and last meals of the day      . Magnesium Oxide 250 MG TABS Take 250 mg by mouth daily.       . Multiple Vitamin (MULTIVITAMIN) tablet Take 1 tablet by mouth daily.        . niacin (NIASPAN) 500 MG CR tablet Take 1 tablet (500 mg total) by mouth at bedtime. Take with applesauce  30 tablet  5  . Probiotic Product (ALIGN) 4 MG CAPS Take by mouth daily.       . Selenium 200 MCG TABS Take by mouth daily.        . Sucralfate (CARAFATE PO) Take by mouth 3 (three) times daily.        . traZODone (DESYREL) 100 MG tablet Take 1-2 tablets       Facility-Administered Medications Prior to Visit  Medication Dose Route Frequency Provider Last Rate Last Dose  . 0.9 %  sodium chloride infusion   500 mL Intravenous Continuous Sheryn Bison, MD       Past Medical History  Diagnosis Date  . Diverticulosis   . Hiatal hernia   . Gastroesophageal reflux disease with hiatal hernia   . IBS (irritable bowel syndrome)   . Hyperlipidemia   . Depression     generalized anxiety disorder  . Spondylosis   . Migraine headache   . Fibromyalgia   . Schatzki's ring     History of  . Peripheral neuropathy   . Sleep apnea   . Osteopenia     BMD ordered by GYN  . Colitis     PMH of  . Mitral valve prolapse   . Nephrolithiasis 2002   Past Surgical History  Procedure Date  . Tubal ligation   . Cholecystectomy   . Vaginal cystectomy     x 2  . Vaginal hysterectomy 05/2007    Vaginal repair, Dr Nicholas Lose  . Rotator cuff repair     left  . Sinus surgery with instatrak   . Esophageal dilation   . Breast enhancement surgery    History   Social History  . Marital Status: Married    Spouse Name: N/A    Number of Children: 2  . Years of Education: N/A  Occupational History  . retired [employer]   Social History Main Topics  . Smoking status: Former Smoker -- 0.5 packs/day for 15 years    Types: Cigarettes    Quit date: 03/03/2004  . Smokeless tobacco: Never Used  . Alcohol Use: No  . Drug Use: No  . Sexually Active: None   Other Topics Concern  . None   Social History Narrative   HAS REGULAR EXERCISEDAILY CAFFEINE 2-3 CUPS   Family History  Problem Relation Age of Onset  . Hypertension Father 77    MI in  his 30s  . Stroke Father 65  . Hypertension Mother   . Neuropathy Mother   . Coronary artery disease Brother     Stent placement  . Colon cancer Neg Hx   . Diabetes Maternal Uncle   . Anemia Mother     S/P splenectomy; ? TCP  . Heart attack      P uncles       Physical Exam: Healthy appearing patient in no acute distress. I cannot appreciate stigmata of chronic liver disease. Vital signs are all stable. Chest is clear she is in a regular rhythm without  murmurs gallops or rubs. There is no abdominal distention, organomegaly, masses or tenderness. Bowel sounds are active but nonobstructed. Specks the rectum is unremarkable as is rectal exam. There is salt stool present which is guaiac negative. Peripheral extremities are unremarkable. Mental status is normal.    Assessment and Plan: Very unusual patient who has defied diagnoses for the last year. She may again have a viral gastroenteritis, but she is greatly concerned that her chronic diarrhea will relapse. I doubt that she has underlying inflammatory bowel disease, but have decided to repeat her flexible sigmoidoscopy, and screening labs have been ordered for review. Previous attempts to treat" colitis" including amino salicylates, prednisone, and Entocort were all unsuccessful. CSF time today spent with the patient and reviewing her records from our office and from [facility]. Encounter Diagnosis  Name Primary?  . Diarrhea Yes

## 2011-04-16 ENCOUNTER — Other Ambulatory Visit: Payer: Self-pay | Admitting: Gastroenterology

## 2011-04-16 ENCOUNTER — Encounter: Payer: Self-pay | Admitting: Gastroenterology

## 2011-04-16 ENCOUNTER — Ambulatory Visit (AMBULATORY_SURGERY_CENTER): Payer: 59 | Admitting: Gastroenterology

## 2011-04-16 VITALS — BP 124/72 | HR 71 | Temp 99.3°F | Resp 24 | Ht 68.0 in | Wt 131.0 lb

## 2011-04-16 DIAGNOSIS — R197 Diarrhea, unspecified: Secondary | ICD-10-CM

## 2011-04-16 DIAGNOSIS — R1032 Left lower quadrant pain: Secondary | ICD-10-CM

## 2011-04-16 DIAGNOSIS — K5289 Other specified noninfective gastroenteritis and colitis: Secondary | ICD-10-CM

## 2011-04-16 MED ORDER — DIPHENOXYLATE-ATROPINE 2.5-0.025 MG PO TABS: 1.0000 | ORAL_TABLET | Freq: Three times a day (TID) | ORAL | Status: DC | PRN

## 2011-04-16 MED ORDER — COLESEVELAM HCL 625 MG PO TABS: ORAL_TABLET | ORAL | Status: DC

## 2011-04-16 MED ORDER — SODIUM CHLORIDE 0.9 % IV SOLN: 500.0000 mL | INTRAVENOUS | Status: DC

## 2011-04-16 NOTE — Progress Notes (Signed)
Patient did not have preoperative order for IV antibiotic SSI prophylaxis. (G8918)  Patient did not experience any of the following events: a burn prior to discharge; a fall within the facility; wrong site/side/patient/procedure/implant event; or a hospital transfer or hospital admission upon discharge from the facility. (G8907)  

## 2011-04-16 NOTE — Progress Notes (Signed)
[name], CRNA administered propofol to the pt. [name]   The pt tolerated the flex sig procedure very well. [name]

## 2011-04-16 NOTE — Progress Notes (Signed)
FODMAP DIET SENT [facility] FOR PT

## 2011-04-16 NOTE — Op Note (Signed)
[facility] [address] [place], [place]  [address]  FLEXIBLE SIGMOIDOSCOPY PROCEDURE REPORT  PATIENT:  Heather, [name]  MR#:  #[id number] BIRTHDATE:  December 05, 1945, 65 yrs. old  GENDER:  female  ENDOSCOPIST:  Vania Rea. Jarold Motto, MD, [facility] Referred by:  PROCEDURE DATE:  04/16/2011 PROCEDURE:  Flexible Sigmoidoscopy, diagnostic ASA CLASS:  Class II INDICATIONS:  unexplained diarrhea llq pain  MEDICATIONS:   propofol (Diprivan) 150 mg IV  DESCRIPTION OF PROCEDURE:   After the risks benefits and alternatives of the procedure were thoroughly explained, informed consent was obtained.  Digital rectal exam was performed and revealed no abnormalities.   The [id number] endoscope was introduced through the anus and advanced to the splenic flexure, without limitations.  The quality of the prep was excellent.  The instrument was then slowly withdrawn as the mucosa was fully examined. <<PROCEDUREIMAGES>>  Mild diverticulosis was found in the sigmoid colon. random biopsies done.  The examination was otherwise normal. Retroflexed views in the rectum revealed no abnormalities.    The scope was then withdrawn from the patient and the procedure terminated. COMPLICATIONS:  None  ENDOSCOPIC IMPRESSION: 1) Mild diverticulosis in the sigmoid colon 2) Otherwise normal examination. R/O MICROSCOPIC/COLLAGENOUS COLITIS VS IBS DIARRHEA. RECOMMENDATIONS: 1) await biopsy results TRIAL OF [facility] AND PRN LOMOTIL.FODMAP DIET  REPEAT EXAM:  No  ______________________________ Vania Rea. Jarold Motto, MD, Clement[facility] Graham  CC:  Heather Lawless, MD  n. [name] Kitchen   Vania Rea. Patterson at 04/16/2011 01:59 PM  Barron Alvine, #[phone]

## 2011-04-17 ENCOUNTER — Telehealth: Payer: Self-pay | Admitting: Gastroenterology

## 2011-04-17 ENCOUNTER — Telehealth: Payer: Self-pay

## 2011-04-17 NOTE — Telephone Encounter (Signed)
  Follow up Call-  Call back number 04/16/2011 07/10/2010  Post procedure Call Back phone  # [phone]  Permission to leave phone message Yes -     Patient questions:  Do you have a fever, pain , or abdominal swelling? no Pain Score  0 *  Have you tolerated food without any problems? yes  Have you been able to return to your normal activities? no  Do you have any questions about your discharge instructions: Diet   no Medications  no Follow up visit  no  Do you have questions or concerns about your Care? no  Actions: * If pain score is 4 or above: No action needed, pain <4.  Per the pt she had llq pain and dairrhea, the pt took her diarrhea medication 2x and it did help.  The pt was here for her procedure for llq abd pain and diarrhea.  I told her to call if she needs Dr. Jarold Motto. Maw

## 2011-04-17 NOTE — Telephone Encounter (Signed)
Pt called to say she is not doubting you, Dr Jarold Motto, but she wants to know if she is being tested for Whipple Disease? Explained to her this should be part of the path staining and exam per literature. Please advise. Thanks.

## 2011-04-18 NOTE — Telephone Encounter (Signed)
Notified pt per Dr Jarold Motto, she does not have Whipple Disease; pt stated understanding.

## 2011-04-21 ENCOUNTER — Ambulatory Visit: Payer: 59 | Admitting: Neurology

## 2011-04-21 ENCOUNTER — Encounter: Payer: Self-pay | Admitting: Gastroenterology

## 2011-04-22 ENCOUNTER — Telehealth: Payer: Self-pay | Admitting: *Deleted

## 2011-04-22 MED ORDER — BUDESONIDE 3 MG PO CP24: ORAL_CAPSULE | ORAL | Status: DC

## 2011-04-22 NOTE — Telephone Encounter (Signed)
Message copied by Florene Glen on Tue Apr 22, 2011 10:30 AM ------      Message from: Jarold Motto, DAVID R      Created: Mon Apr 21, 2011  4:05 PM       Restart entocort generic 9mg /day per "lymphocytic colitis" or offer her repeat referral to [facility]

## 2011-04-22 NOTE — Telephone Encounter (Signed)
Spoke with pt to inform her of her dx of lymphocytic colitis and the need for entocort. Also, if she would rather f/u at [facility]. Pt had lots of questions; I will mail her info I have, order her med and she will f/u with Dr Jarold Motto on 05/20/11 after 1 month of therapy; pt stated understanding.

## 2011-05-02 ENCOUNTER — Telehealth: Payer: Self-pay | Admitting: Gastroenterology

## 2011-05-02 MED ORDER — ESOMEPRAZOLE MAGNESIUM 40 MG PO CPDR: DELAYED_RELEASE_CAPSULE | ORAL | Status: DC

## 2011-05-02 NOTE — Telephone Encounter (Signed)
rx sent pt aware 

## 2011-05-05 ENCOUNTER — Telehealth: Payer: Self-pay | Admitting: Cardiovascular Disease

## 2011-05-05 NOTE — Telephone Encounter (Signed)
Patient called and has decided she wants to do the event monitor that was discussed at last appointment.  Please call patient to set up.  She said she would like to have the monitor place in the office as she is not good with hooking it up.  Told patient you were out of the office today, but would call her tomorrow when you return to set up.

## 2011-05-07 NOTE — Telephone Encounter (Signed)
The patient would like [place] to hook the monitor in the office. She will come on Thursday.

## 2011-05-08 NOTE — Telephone Encounter (Signed)
Monitor placed today by Bishop Dublin, CMA

## 2011-05-14 ENCOUNTER — Encounter: Payer: Self-pay | Admitting: *Deleted

## 2011-05-20 ENCOUNTER — Encounter: Payer: Self-pay | Admitting: Gastroenterology

## 2011-05-20 ENCOUNTER — Ambulatory Visit (INDEPENDENT_AMBULATORY_CARE_PROVIDER_SITE_OTHER): Payer: 59 | Admitting: Gastroenterology

## 2011-05-20 VITALS — BP 118/64 | HR 80 | Ht 68.0 in | Wt 126.0 lb

## 2011-05-20 DIAGNOSIS — K5289 Other specified noninfective gastroenteritis and colitis: Secondary | ICD-10-CM

## 2011-05-20 DIAGNOSIS — K52832 Lymphocytic colitis: Secondary | ICD-10-CM

## 2011-05-20 DIAGNOSIS — G8929 Other chronic pain: Secondary | ICD-10-CM

## 2011-05-20 DIAGNOSIS — R1032 Left lower quadrant pain: Secondary | ICD-10-CM | POA: Insufficient documentation

## 2011-05-20 DIAGNOSIS — K589 Irritable bowel syndrome without diarrhea: Secondary | ICD-10-CM

## 2011-05-20 DIAGNOSIS — K5730 Diverticulosis of large intestine without perforation or abscess without bleeding: Secondary | ICD-10-CM

## 2011-05-20 MED ORDER — BUDESONIDE 9 MG PO TB24: 9.0000 mg | ORAL_TABLET | Freq: Every day | ORAL | Status: DC

## 2011-05-20 MED ORDER — PSYLLIUM 58.6 % PO POWD: 1.0000 | Freq: Every day | ORAL | Status: DC

## 2011-05-20 NOTE — Patient Instructions (Signed)
Please stop taking the Carafate, and Digex. You have been given samples of Uceris to use 1 time daily only if your diarrhea returns. If your diarrhea returns call our office.  Begin Metamucil 1 heaping teaspoon daily.  Samples have been given. You have been given a high fiber diet.  Information on diverticulosis has also been provided. Continue your Nexium.  Diverticulosis is a common condition that develops when small pouches (diverticula) form in the wall of the colon. The risk of diverticulosis increases with age. It happens more often in people who eat a low-fiber diet. Most individuals with diverticulosis have no symptoms. Those individuals with symptoms usually experience abdominal pain, constipation, or loose stools (diarrhea). HOME CARE INSTRUCTIONS   Increase the amount of fiber in your diet as directed by your caregiver or dietician. This may reduce symptoms of diverticulosis.   Your caregiver may recommend taking a dietary fiber supplement.   Drink at least 6 to 8 glasses of water each day to prevent constipation.   Try not to strain when you have a bowel movement.   Your caregiver may recommend avoiding nuts and seeds to prevent complications, although this is still an uncertain benefit.   Only take over-the-counter or prescription medicines for pain, discomfort, or fever as directed by your caregiver.  FOODS WITH HIGH FIBER CONTENT INCLUDE:  Fruits. Apple, peach, pear, tangerine, raisins, prunes.   Vegetables. Brussels sprouts, asparagus, broccoli, cabbage, carrot, cauliflower, romaine lettuce, spinach, summer squash, tomato, winter squash, zucchini.   Starchy Vegetables. Baked beans, kidney beans, lima beans, split peas, lentils, potatoes (with skin).   Grains. Whole wheat bread, brown rice, bran flake cereal, plain oatmeal, white rice, shredded wheat, bran muffins.  SEEK IMMEDIATE MEDICAL CARE IF:   You develop increasing pain or severe bloating.   You have an oral  temperature above 102 F (38.9 C), not controlled by medicine.   You develop vomiting or bowel movements that are bloody or black.  Document Released: 11/15/2003 Document Revised: 02/06/2011 Document Reviewed: 07/18/2009 [facility] Patient Information 2012 [place], [place].

## 2011-05-20 NOTE — Progress Notes (Signed)
History of Present Illness: This is a complex 66 year old Caucasian female recently treated for lymphocytic colitis confirmed by flexible sigmoidoscopy and colon biopsies. She has had complete resolution of her diarrhea after taking Entocort 9 mg a day for one month. She currently complains of constipation, gas, bloating, and lower abdominal discomfort. Takes Nexium 40 mg a day for GERD,Align robotic therapy, and stool softeners. She denies rectal bleeding, anorexia, weight loss, or systemic complaints.    Current Medications, Allergies, Past Medical History, Past Surgical History, Family History and Social History were reviewed in [employer] record.   Assessment and plan: We had a prolonged discussion and review of all of her medications and problems today, over 30 minutes. I explained to this patient in detail lymphocytic colitis, its suspected etiology and management, and have given her samples of Entocort 9 mg a day to use if her severe diarrheal state returns, and she is to call as immediately. She has symptomatic diverticulosis, and I have placed her on a high fiber diet with liberal by mouth fluids and daily Metamucil, have stopped Carafate which is probably causing some constipation,Digex antispasmodic medication, and we'll see her again in one month's time for followup. She continued to deny use of NSAIDs or salicylates. Despite long and repeated explanations, I am not sure this patient comprehends her GI issues. No diagnosis found.

## 2011-06-09 ENCOUNTER — Ambulatory Visit: Payer: 59 | Admitting: Neurology

## 2011-06-10 ENCOUNTER — Encounter: Payer: Self-pay | Admitting: Internal Medicine

## 2011-06-10 ENCOUNTER — Ambulatory Visit (INDEPENDENT_AMBULATORY_CARE_PROVIDER_SITE_OTHER)
Admission: RE | Admit: 2011-06-10 | Discharge: 2011-06-10 | Disposition: A | Discharge status: Home / Self Care | Payer: 59 | Source: Ambulatory Visit | Attending: Internal Medicine | Admitting: Internal Medicine

## 2011-06-10 ENCOUNTER — Ambulatory Visit: Payer: 59 | Admitting: Internal Medicine

## 2011-06-10 ENCOUNTER — Ambulatory Visit (INDEPENDENT_AMBULATORY_CARE_PROVIDER_SITE_OTHER): Payer: 59 | Admitting: Internal Medicine

## 2011-06-10 VITALS — BP 110/62 | HR 72 | Temp 97.8°F | Wt 127.0 lb

## 2011-06-10 DIAGNOSIS — R091 Pleurisy: Secondary | ICD-10-CM

## 2011-06-10 DIAGNOSIS — J069 Acute upper respiratory infection, unspecified: Secondary | ICD-10-CM

## 2011-06-10 DIAGNOSIS — K5289 Other specified noninfective gastroenteritis and colitis: Secondary | ICD-10-CM

## 2011-06-10 DIAGNOSIS — J209 Acute bronchitis, unspecified: Secondary | ICD-10-CM

## 2011-06-10 DIAGNOSIS — K52832 Lymphocytic colitis: Secondary | ICD-10-CM

## 2011-06-10 DIAGNOSIS — R042 Hemoptysis: Secondary | ICD-10-CM

## 2011-06-10 MED ORDER — TRAMADOL HCL 50 MG PO TABS: 50.0000 mg | ORAL_TABLET | Freq: Four times a day (QID) | ORAL | Status: AC | PRN

## 2011-06-10 MED ORDER — HYDROCODONE-HOMATROPINE 5-1.5 MG/5ML PO SYRP: 5.0000 mL | ORAL_SOLUTION | Freq: Four times a day (QID) | ORAL | Status: AC | PRN

## 2011-06-10 MED ORDER — DOXYCYCLINE HYCLATE 100 MG PO TABS: 100.0000 mg | ORAL_TABLET | Freq: Two times a day (BID) | ORAL | Status: AC

## 2011-06-10 NOTE — Patient Instructions (Signed)
Order for x-rays entered into  the computer; these will be performed at [address] across from [facility]. No appointment is necessary. Please try to go on My Chart within the next 24 hours to allow me to release the results directly to you.  

## 2011-06-10 NOTE — Progress Notes (Signed)
  Subjective:    Patient ID: [name], female    DOB: 1945-07-09, 66 y.o.   MRN: [id number]  HPI Respiratory tract infection Onset/symptoms:3 weeks as head congestion Exposures (illness/environmental/extrinsic):no Progression of symptoms:intermittent waxing & waning congestion & chills Treatments/response:Mucinex D/ none Present symptoms: Fever/chills/sweats:temp not measured Frontal headache:yes Facial pain:no Nasal purulence:scant yellow Sore throat:minor Dental pain:upper teeth Lymphadenopathy:in neck Cough/sputum/hemoptysis:phlegm with specks of blood; copious faint yellow sputum Pleuritic pain:R inferior thorax  Associated extrinsic/allergic symptoms:itchy eyes/ sneezing:some sneezing previousl         Past medical history: Seasonal allergies: some/asthma:? Smoking history:quit 2006   Review of Systems She denies any purulent discharge from her eyes for loss of vision.  She has been treated for colitis recently; she denies significant abdominal pain, melena, rectal bleeding, or diarrhea at this time.  She's noticed some traces of blood from her nose. She denies any other bleeding dyscrasias such as hematuria or GI bleeding as noted above.     Objective:   Physical Exam General appearance:thin but in good health ;well nourished; some R sided splinting w/o  increased work of breathing is present.  No  lymphadenopathy about the head, neck, or axilla noted.   Eyes: No conjunctival inflammation or lid edema is present. Vision intact with lenses.  Ears:  External ear exam shows no significant lesions or deformities.  Otoscopic examination reveals clear canals, tympanic membranes are intact bilaterally without bulging, retraction, inflammation or discharge.  Nose:  External nasal examination shows no deformity or inflammation. Nasal mucosa are pink and moist without lesions or exudates. No septal dislocation or deviation.No obstruction to airflow.    Oral exam: Dental hygiene is good; lips and gums are healthy appearing.There is no oropharyngeal erythema or exudate noted.   Neck:  No deformities, thyromegaly, masses, or tenderness noted.   Supple with full range of motion without pain.   Heart:  Normal rate and regular rhythm. S1 and S2 normal without gallop, murmur, click, rub or other extra sounds.   Lungs:Chest clear to auscultation; no wheezes, rhonchi,rales ,or rubs present. As noted she does splint on the right Extremities:  No cyanosis, edema, or clubbing  noted . Homans sign is negative   Skin: Warm & dry w/o jaundice or tenting. Excellent turgor          Assessment & Plan:  #1 acute bronchitis w/o bronchospasm  #2 pleurisy, right-sided  #3 scant upper respiratory tract bleeding and hemoptysis without suggestion of bleeding dyscrasia  #4 recent colitis  #5 upper respiratory tract symptoms without criteria for rhinosinusitis Plan: See orders and recommendations

## 2011-06-16 ENCOUNTER — Telehealth: Payer: Self-pay | Admitting: Internal Medicine

## 2011-06-16 NOTE — Telephone Encounter (Signed)
Patient is not better from last visit 4.9.13 and wanted to speak with Dr. Alwyn Ren or his Nurse regarding this issue Patient phone# for the rest of the day [phone]

## 2011-06-16 NOTE — Telephone Encounter (Signed)
Patient called back to check on status of earlier request. I advised pt that her message had been sent to Dr. Frederik Pear assistant. I left her # with CAN to call her back. She did not want to be transferred, but did want a nurse to call her back.

## 2011-06-16 NOTE — Telephone Encounter (Signed)
Pt c/o pressure and pain in chest/breast area, dry cough, sob, tingling in arm and chest. Pt advise to ED. Pt ok.

## 2011-06-20 ENCOUNTER — Encounter: Payer: Self-pay | Admitting: Cardiovascular Disease

## 2011-06-23 ENCOUNTER — Ambulatory Visit: Payer: 59 | Admitting: Gastroenterology

## 2011-06-26 ENCOUNTER — Encounter: Payer: Self-pay | Admitting: Internal Medicine

## 2011-06-26 ENCOUNTER — Ambulatory Visit (INDEPENDENT_AMBULATORY_CARE_PROVIDER_SITE_OTHER)
Admission: RE | Admit: 2011-06-26 | Discharge: 2011-06-26 | Disposition: A | Discharge status: Home / Self Care | Payer: 59 | Source: Ambulatory Visit | Attending: Internal Medicine | Admitting: Internal Medicine

## 2011-06-26 ENCOUNTER — Ambulatory Visit (INDEPENDENT_AMBULATORY_CARE_PROVIDER_SITE_OTHER): Payer: 59 | Admitting: Internal Medicine

## 2011-06-26 VITALS — BP 108/66 | HR 64 | Temp 97.7°F | Resp 14 | Wt 129.8 lb

## 2011-06-26 DIAGNOSIS — R059 Cough, unspecified: Secondary | ICD-10-CM

## 2011-06-26 DIAGNOSIS — R05 Cough: Secondary | ICD-10-CM

## 2011-06-26 DIAGNOSIS — R071 Chest pain on breathing: Secondary | ICD-10-CM

## 2011-06-26 DIAGNOSIS — R0781 Pleurodynia: Secondary | ICD-10-CM

## 2011-06-26 LAB — SEDIMENTATION RATE: Sed Rate: 12 mm/hr (ref 0–22)

## 2011-06-26 LAB — D-DIMER, QUANTITATIVE: D-Dimer, Quant: 0.26 ug/mL-FEU (ref 0.00–0.48)

## 2011-06-26 MED ORDER — BENZONATATE 200 MG PO CAPS: ORAL_CAPSULE | ORAL | Status: DC

## 2011-06-26 MED ORDER — TRAMADOL HCL 50 MG PO TABS
50.0000 mg | ORAL_TABLET | Freq: Four times a day (QID) | ORAL | Status: AC | PRN
Start: 2011-06-26 — End: 2011-07-06

## 2011-06-26 NOTE — Patient Instructions (Signed)
Consider glucosamine sulfate 1500 mg daily for joint symptoms. Take this daily  for 3 months and then leave it off for 2 months. This will rehydrate the cartilages. Order for x-rays entered into  the computer; these will be performed at [address] across from [facility]. No appointment is necessary.

## 2011-06-26 NOTE — Progress Notes (Signed)
Subjective:    Patient ID: [name], female    DOB: 1945-10-11, 66 y.o.   MRN: [id number]  HPI she describes a constant right posterior thorax pain for the last 3 weeks. It varies in intensity with a burning and knifelike quality. It's located in the right infrascapular area but radiates anteriorly  to the inframammary area. She has switched to a wrap rather than wearing her bra  as the wire causes discomfort  It is worse with lateral rotation of thorax or with position change.  Her cough is now "deep" with some intermittent clear sputum; she has not had any hemoptysis  She describes having sensation of being cold and then hot but has not documented definite fever.   Review of Systems There has been no associated abdominal pain, melena, or rectal bleeding. He has been stable.  She denies any upper respiratory tract symptoms such as frontal headache, facial pain, or nasal purulence  No associated rash with the chest discomfort     Objective:   Physical Exam Gen.: Thin but healthy and well-nourished in appearance. Alert, appropriate and cooperative throughout exam.  Eyes: No corneal or conjunctival inflammation noted.No icterus Ears: External  ear exam reveals no significant lesions or deformities. Canals clear .TMs normal. Hearing is grossly normal bilaterally. Nose: External nasal exam reveals no deformity or inflammation. Nasal mucosa are pink and moist. No lesions or exudates noted.   Mouth: Oral mucosa and oropharynx reveal no lesions or exudates. Teeth in good repair. Neck: No deformities, masses, or tenderness noted. Range of motion normal Lungs: Normal respiratory effort; chest expands symmetrically. Lungs are clear to auscultation without rales, wheezes, or increased work of breathing.  Chest: Her pain can be elicited by pressure on the right inferior costal tissues. Compression of the thorax does not cause the pain. Heart: Normal rate and rhythm. Normal S1  and S2. No gallop, click, or rub. S4 with slurring at  LSB ; no significant  murmur. Abdomen: Bowel sounds normal; abdomen soft and nontender. No masses, organomegaly or hernias noted.Aorta palpable with bruit  ; no AAA                                                          Musculoskeletal/extremities: No deformity or scoliosis noted of  the thoracic or lumbar spine. No clubbing, cyanosis, edema, or deformity noted. Range of motion  normal .Tone & strength  normal.Joints normal. Nail health  good. She is able to lie flat and sit up without help but has some discomfort with movement Vascular: Carotid, radial artery, dorsalis pedis and  posterior tibial pulses are full and equal. No bruits present. Homans sign negative bilaterally Neurologic: Alert and oriented x3. Deep tendon reflexes symmetrical and normal.          Skin: Intact without suspicious lesions or rashes. Lymph: No cervical, axillary lymphadenopathy present. Psych: Mood and affect are normal. Normally interactive  Assessment & Plan:  #1 chest wall pain; costochondritis is suggested clinically. Thoracic radiculopathy does not appear to be present. There is no neuromuscular deficit.  Plan: See orders and recommendations. Rib detail films will be collected along with d-dimer and repeat sedimentation rate. Limited bone scan may be required

## 2011-08-04 ENCOUNTER — Encounter: Payer: Self-pay | Admitting: Cardiovascular Disease

## 2011-08-04 ENCOUNTER — Ambulatory Visit (INDEPENDENT_AMBULATORY_CARE_PROVIDER_SITE_OTHER): Payer: 59 | Admitting: Cardiovascular Disease

## 2011-08-04 VITALS — BP 100/76 | HR 59 | Ht 68.0 in | Wt 130.0 lb

## 2011-08-04 DIAGNOSIS — I059 Rheumatic mitral valve disease, unspecified: Secondary | ICD-10-CM

## 2011-08-04 DIAGNOSIS — R002 Palpitations: Secondary | ICD-10-CM

## 2011-08-04 DIAGNOSIS — E785 Hyperlipidemia, unspecified: Secondary | ICD-10-CM

## 2011-08-04 NOTE — Patient Instructions (Addendum)
You are doing well. No medication changes were made. Try zyrtec (generic) for allergies  Please call us if you have new issues that need to be addressed before your next appt.  Your physician wants you to follow-up in: 12 months.  You will receive a reminder letter in the mail two months in advance. If you don't receive a letter, please call our office to schedule the follow-up appointment.

## 2011-08-04 NOTE — Assessment & Plan Note (Signed)
Excellent control on her current medication regimen. No medication changes made.

## 2011-08-04 NOTE — Assessment & Plan Note (Signed)
Mild MR seen on previous echocardiogram. No mention of prolapse. No further studies needed.

## 2011-08-04 NOTE — Assessment & Plan Note (Signed)
No significant arrhythmia noted on 30 day monitor. Currently relatively asymptomatic. No medication changes made. She does not want any new medications such as beta blockers for symptomatic palpitations.

## 2011-08-04 NOTE — Progress Notes (Signed)
Patient ID: [name], female    DOB: November 29, 1945, 66 y.o.   MRN: [id number]  HPI Comments: 66 yo WF with history of hyperlipidemia,fibromyalgia, extensive GI disease including hiatal hernia, GERD, gastritis, Schatzkes ring s/p dilatations here today for follow up.   previous episodes of heaviness in her chest. Cardiac catheterization in the past showed no significant disease. Holter monitor showed short runs of SVT. 48-hour study.  episodes of shortness of breath, 30 day event monitor showing ABCs but no significant SVT. She presents for followup.  She reports having occasional palpitations. No recent long runs of tachycardia. No significant events noted on a 30 day monitor. She does not want medications for her palpitations. She is tolerating her cholesterol medication well. Last echocardiogram showed mild MR with no other significant abnormalities. Strong family history of coronary artery disease.  EKG today shows normal sinus rhythm with rate 59 beats per minute with no significant ST or T wave changes    Outpatient Encounter Prescriptions as of 08/04/2011  Medication Sig Dispense Refill  . atorvastatin (LIPITOR) 40 MG tablet Take 1 tablet (40 mg total) by mouth daily.  30 tablet  6  . Biotin 1000 MCG tablet Take 1,000 mcg by mouth daily.        [name] esomeprazole (NEXIUM) 40 MG capsule Take 30- 60 min before your first and last meals of the day  60 capsule  6  . Magnesium Oxide 250 MG TABS Take 250 mg by mouth daily.       . Multiple Vitamin (MULTIVITAMIN) tablet Take 1 tablet by mouth daily.        . niacin (NIASPAN) 500 MG CR tablet Take 1 tablet (500 mg total) by mouth at bedtime. Take with applesauce  30 tablet  5  . Probiotic Product (ALIGN) 4 MG CAPS Take by mouth daily.       . psyllium (METAMUCIL SMOOTH TEXTURE) 58.6 % powder Take 1 packet by mouth daily.  283 g  12  . traZODone (DESYREL) 100 MG tablet Take 1-2 tablets       Review of Systems  Constitutional:  Negative.   HENT: Negative.   Eyes: Negative.   Cardiovascular: Positive for palpitations.  Gastrointestinal: Negative.   Musculoskeletal: Negative.   Skin: Negative.   Neurological: Negative.   Hematological: Negative.   Psychiatric/Behavioral: Negative.   All other systems reviewed and are negative.    BP 100/76  Pulse 59  Ht 5\' 8"  (1.727 m)  Wt 130 lb (58.968 kg)  BMI 19.77 kg/m2  Physical Exam  Nursing note and vitals reviewed. Constitutional: She is oriented to person, place, and time. She appears well-developed and well-nourished.  HENT:  Head: Normocephalic.  Nose: Nose normal.  Mouth/Throat: Oropharynx is clear and moist.  Eyes: Conjunctivae are normal. Pupils are equal, round, and reactive to light.  Neck: Normal range of motion. Neck supple. No JVD present.  Cardiovascular: Normal rate, regular rhythm, S1 normal, S2 normal, normal heart sounds and intact distal pulses.  Exam reveals no gallop and no friction rub.   No murmur heard. Pulmonary/Chest: Effort normal and breath sounds normal. No respiratory distress. She has no wheezes. She has no rales. She exhibits no tenderness.  Abdominal: Soft. Bowel sounds are normal. She exhibits no distension. There is no tenderness.  Musculoskeletal: Normal range of motion. She exhibits no edema and no tenderness.  Lymphadenopathy:    She has no cervical adenopathy.  Neurological: She is alert and oriented to person,  place, and time. Coordination normal.  Skin: Skin is warm and dry. No rash noted. No erythema.  Psychiatric: She has a normal mood and affect. Her behavior is normal. Judgment and thought content normal.         Assessment and Plan

## 2011-08-27 ENCOUNTER — Telehealth: Payer: Self-pay | Admitting: Internal Medicine

## 2011-08-27 DIAGNOSIS — E785 Hyperlipidemia, unspecified: Secondary | ICD-10-CM

## 2011-08-27 MED ORDER — NIACIN ER (ANTIHYPERLIPIDEMIC) 500 MG PO TBCR: 500.0000 mg | EXTENDED_RELEASE_TABLET | Freq: Every day | ORAL | Status: DC

## 2011-08-27 NOTE — Telephone Encounter (Signed)
Refill: Niaspan 500mg  ter #30. Take 1 tablet by mouth every night at bedtime *take with applesauce*. Last fill 07-31-11

## 2011-09-17 ENCOUNTER — Other Ambulatory Visit: Payer: Self-pay | Admitting: *Deleted

## 2011-09-17 MED ORDER — ATORVASTATIN CALCIUM 40 MG PO TABS: 40.0000 mg | ORAL_TABLET | Freq: Every day | ORAL | Status: DC

## 2011-09-17 NOTE — Telephone Encounter (Signed)
Refilled Atorvastatin.

## 2011-09-23 ENCOUNTER — Other Ambulatory Visit: Payer: Self-pay | Admitting: Gynecology

## 2011-10-06 ENCOUNTER — Encounter: Payer: Self-pay | Admitting: Family Medicine

## 2011-10-06 ENCOUNTER — Ambulatory Visit (INDEPENDENT_AMBULATORY_CARE_PROVIDER_SITE_OTHER): Payer: 59 | Admitting: Family Medicine

## 2011-10-06 VITALS — BP 112/68 | HR 69 | Temp 98.2°F | Wt 133.6 lb

## 2011-10-06 DIAGNOSIS — J40 Bronchitis, not specified as acute or chronic: Secondary | ICD-10-CM

## 2011-10-06 MED ORDER — DOXYCYCLINE HYCLATE 100 MG PO TABS: 100.0000 mg | ORAL_TABLET | Freq: Two times a day (BID) | ORAL | Status: AC

## 2011-10-06 MED ORDER — ALBUTEROL SULFATE HFA 108 (90 BASE) MCG/ACT IN AERS: 2.0000 | INHALATION_SPRAY | Freq: Four times a day (QID) | RESPIRATORY_TRACT | Status: DC | PRN

## 2011-10-06 MED ORDER — GUAIFENESIN-CODEINE 100-10 MG/5ML PO SYRP: ORAL_SOLUTION | ORAL | Status: DC

## 2011-10-06 MED ORDER — BUDESONIDE-FORMOTEROL FUMARATE 80-4.5 MCG/ACT IN AERO: 2.0000 | INHALATION_SPRAY | Freq: Two times a day (BID) | RESPIRATORY_TRACT | Status: DC

## 2011-10-06 NOTE — Progress Notes (Signed)
  Subjective:     [name] is a 66 y.o. female who presents for evaluation of sinus pain. Symptoms include: congestion, cough, facial pain, nasal congestion and sinus pressure. Onset of symptoms was 1 week ago. Symptoms have been gradually worsening since that time. Past history is significant for no history of pneumonia or bronchitis. Patient is former smoker.    The following portions of the patient's history were reviewed and updated as appropriate: allergies, current medications, past family history, past medical history, past social history, past surgical history and problem list.  Review of Systems Pertinent items are noted in HPI.   Objective:    BP 112/68  Pulse 69  Temp 98.2 F (36.8 C) (Oral)  Wt 133 lb 9.6 oz (60.601 kg)  SpO2 97% General appearance: alert, cooperative, appears stated age and no distress Ears: normal TM's and external ear canals both ears Nose: green discharge, mild congestion, sinus tenderness bilateral Throat: lips, mucosa, and tongue normal; teeth and gums normal Neck: no adenopathy, no carotid bruit, no JVD, supple, symmetrical, trachea midline and thyroid not enlarged, symmetric, no tenderness/mass/nodules Lungs: wheezes bilaterally    Assessment:    Acute bacterial sinusitis.    Plan:    Nasal steroids per medication orders. Antihistamines per medication orders. doxy for 10 days per medication orders. rto prn

## 2011-10-06 NOTE — Patient Instructions (Signed)

## 2011-11-07 ENCOUNTER — Ambulatory Visit (INDEPENDENT_AMBULATORY_CARE_PROVIDER_SITE_OTHER): Payer: 59 | Admitting: Family Medicine

## 2011-11-07 ENCOUNTER — Encounter: Payer: Self-pay | Admitting: Family Medicine

## 2011-11-07 VITALS — BP 90/60 | HR 73 | Temp 98.4°F | Wt 134.4 lb

## 2011-11-07 DIAGNOSIS — J329 Chronic sinusitis, unspecified: Secondary | ICD-10-CM

## 2011-11-07 MED ORDER — CEFUROXIME AXETIL 500 MG PO TABS: 500.0000 mg | ORAL_TABLET | Freq: Two times a day (BID) | ORAL | Status: AC

## 2011-11-07 MED ORDER — BECLOMETHASONE DIPROPIONATE 80 MCG/ACT NA AERS: 2.0000 | INHALATION_SPRAY | Freq: Every day | NASAL | Status: DC

## 2011-11-07 MED ORDER — METHYLPREDNISOLONE ACETATE 80 MG/ML IJ SUSP
80.0000 mg | Freq: Once | INTRAMUSCULAR | Status: AC
  Administered 2011-11-07: 80 mg via INTRAMUSCULAR

## 2011-11-07 MED ORDER — PREDNISONE 10 MG PO TABS: ORAL_TABLET | ORAL | Status: DC

## 2011-11-07 NOTE — Patient Instructions (Addendum)

## 2011-11-07 NOTE — Progress Notes (Signed)
  Subjective:     [name] is a 66 y.o. female who presents for evaluation of sinus pain. Symptoms include: congestion, facial pain, headaches, nasal congestion, periorbital venous congestion, puffiness of the eyes, purulent rhinorrhea, sinus pressure and tooth pain. Onset of symptoms was 2 weeks ago. Symptoms have been gradually worsening since that time. Past history is significant for occasional episodes of bronchitis. Patient is a non-smoker.  The following portions of the patient's history were reviewed and updated as appropriate: allergies, current medications, past family history, past medical history, past social history, past surgical history and problem list.  Review of Systems Pertinent items are noted in HPI.   Objective:    BP 90/60  Pulse 73  Temp 98.4 F (36.9 C) (Oral)  Wt 134 lb 6.4 oz (60.963 kg)  SpO2 95% General appearance: alert, cooperative, appears stated age and no distress Ears: normal TM's and external ear canals both ears Nose: green discharge, moderate congestion, turbinates red, swollen, sinus tenderness bilateral Throat: lips, mucosa, and tongue normal; teeth and gums normal Neck: mild anterior cervical adenopathy, supple, symmetrical, trachea midline and thyroid not enlarged, symmetric, no tenderness/mass/nodules Lungs: clear to auscultation bilaterally    Assessment:    Acute bacterial sinusitis.    Plan:    Nasal steroids per medication orders. Antihistamines per medication orders. Ceftin per medication orders.

## 2011-11-10 ENCOUNTER — Ambulatory Visit: Payer: 59 | Admitting: Internal Medicine

## 2011-11-26 ENCOUNTER — Encounter: Payer: Self-pay | Admitting: Gastroenterology

## 2011-12-01 ENCOUNTER — Ambulatory Visit (INDEPENDENT_AMBULATORY_CARE_PROVIDER_SITE_OTHER): Payer: 59 | Admitting: Internal Medicine

## 2011-12-01 ENCOUNTER — Encounter: Payer: Self-pay | Admitting: Internal Medicine

## 2011-12-01 VITALS — BP 116/68 | HR 73 | Temp 97.9°F | Wt 136.4 lb

## 2011-12-01 DIAGNOSIS — R531 Weakness: Secondary | ICD-10-CM

## 2011-12-01 DIAGNOSIS — R05 Cough: Secondary | ICD-10-CM

## 2011-12-01 DIAGNOSIS — J329 Chronic sinusitis, unspecified: Secondary | ICD-10-CM

## 2011-12-01 DIAGNOSIS — R5381 Other malaise: Secondary | ICD-10-CM

## 2011-12-01 DIAGNOSIS — G43909 Migraine, unspecified, not intractable, without status migrainosus: Secondary | ICD-10-CM

## 2011-12-01 DIAGNOSIS — G609 Hereditary and idiopathic neuropathy, unspecified: Secondary | ICD-10-CM

## 2011-12-01 DIAGNOSIS — R002 Palpitations: Secondary | ICD-10-CM

## 2011-12-01 MED ORDER — BECLOMETHASONE DIPROPIONATE 80 MCG/ACT NA AERS: 2.0000 | INHALATION_SPRAY | Freq: Every day | NASAL | Status: DC

## 2011-12-01 NOTE — Progress Notes (Signed)
  Subjective:    Patient ID: [name], female    DOB: 1946/01/27, 66 y.o.   MRN: [id number]  HPI  For approximately 8 weeks she has noted paroxysmal episodes of profound weakness of her legs and near syncope. She must recline or else she has unstable, wobbly gait. The symptoms can last up to 2 hours. She also has associated dizziness without any specific trigger such as position change, straining or pain. She denies any seizure stigmata or frank syncope. She denies any cardiac or neuro prodrome prior to these episodes of dizziness. Specifically she denies associated palpitations or rhythm or rate change. There is no associated headache or  numbness or tingling. This is associated with some fatigue, nausea & blurring of her vision without diplopia or frank loss. She has no associated tinnitus or hearing loss.  She has a past medical history of migraine headaches  She has a history of neuropathy of unknown etiology; this was diagnosed by the Neurologist remotely. Apparently ; she was intolerant of the medication with which he treated her for restless legs.  Last year she wore an event Holter monitor to evaluate palpitations as per Cardiologist. No treatment was prescribed.   Extensive labs were performed 04/15/11; all were normal including a B12 level of 545.    Review of Systems Constitutional: no fever, chills, sweats, change in weight  Musculoskeletal:no muscle cramps or pain; no  joint stiffness, redness, or swelling Skin:no rash, color or temperature change Neuro: no  incontinence (stool/urine) Heme:no lymphadenopathy; abnormal bruising or bleeding        Objective:   Physical Exam  Gen. appearance: thin but well-nourished, in no distress Eyes: Extraocular motion intact, field of vision normal, vision grossly intact with lenses, no nystagmus. Some thinning of the eyebrows laterally ENT: Canals clear, tympanic membranes normal,  hearing grossly normal. ? Lateralization  to L with tuning fork Neck: Slightly decreased range of motion, no masses, normal thyroid Cardiovascular: Rate and rhythm normal; no murmurs, gallops or extra heart sounds Muscle skeletal: Range of motion, tone, &  strength normal Neuro:no cranial nerve deficit, deep tendon  reflexes normal, gait normal. Romberg and finger to nose testing normal.  Lymph: No cervical or axillary LA Skin: Warm and dry without suspicious lesions or rashes Psych: no anxiety or mood change. Normally interactive and cooperative.          Assessment & Plan:  #1 paroxysmal limb weakness associated with fatigue and dizziness. No localizing signs on exam  #2 peripheral neuropathy, idiopathic  #3 history of palpitations; no significant findings on event monitor.  #4 history of migraines; this could be an atypical migraine variant  Plan: See orders & recommendations

## 2011-12-01 NOTE — Patient Instructions (Addendum)
I recommend a Neurology consultation to determine optimal therapy; please inform me if you have a physician preference.  

## 2011-12-02 LAB — CBC WITH DIFFERENTIAL/PLATELET
Basophils Absolute: 0.1 10*3/uL (ref 0.0–0.1)
Basophils Relative: 1 % (ref 0.0–3.0)
Eosinophils Absolute: 0.1 10*3/uL (ref 0.0–0.7)
HCT: 38 % (ref 36.0–46.0)
Hemoglobin: 12.6 g/dL (ref 12.0–15.0)
Lymphs Abs: 1.3 10*3/uL (ref 0.7–4.0)
MCHC: 33 g/dL (ref 30.0–36.0)
MCV: 92.2 fl (ref 78.0–100.0)
Neutro Abs: 4.3 10*3/uL (ref 1.4–7.7)
RBC: 4.12 Mil/uL (ref 3.87–5.11)
RDW: 14.9 % — ABNORMAL HIGH (ref 11.5–14.6)

## 2011-12-02 LAB — HEPATIC FUNCTION PANEL
Albumin: 4.1 g/dL (ref 3.5–5.2)
Total Protein: 6.8 g/dL (ref 6.0–8.3)

## 2011-12-02 LAB — BASIC METABOLIC PANEL
CO2: 29 mEq/L (ref 19–32)
Chloride: 100 mEq/L (ref 96–112)
Glucose, Bld: 86 mg/dL (ref 70–99)
Potassium: 3.8 mEq/L (ref 3.5–5.1)
Sodium: 135 mEq/L (ref 135–145)

## 2011-12-03 ENCOUNTER — Telehealth: Payer: Self-pay

## 2011-12-03 DIAGNOSIS — J329 Chronic sinusitis, unspecified: Secondary | ICD-10-CM

## 2011-12-03 MED ORDER — BECLOMETHASONE DIPROPIONATE 80 MCG/ACT NA AERS: 2.0000 | INHALATION_SPRAY | Freq: Every day | NASAL | Status: DC

## 2011-12-03 NOTE — Telephone Encounter (Signed)
Pt states pharmacy never received Rx need okay to resend. The one given at OV showing 0. Pt states assistant primed at visit and stuff coming out. Plz advise         MW

## 2011-12-03 NOTE — Telephone Encounter (Signed)
Rx sent.    MW 

## 2011-12-03 NOTE — Telephone Encounter (Signed)
OK #1 , R X 5

## 2011-12-16 ENCOUNTER — Ambulatory Visit: Payer: 59 | Admitting: Cardiovascular Disease

## 2011-12-17 ENCOUNTER — Encounter: Payer: Self-pay | Admitting: Cardiovascular Disease

## 2011-12-17 ENCOUNTER — Ambulatory Visit (INDEPENDENT_AMBULATORY_CARE_PROVIDER_SITE_OTHER): Payer: 59 | Admitting: Cardiovascular Disease

## 2011-12-17 VITALS — BP 86/60 | HR 65 | Ht 68.5 in | Wt 136.0 lb

## 2011-12-17 DIAGNOSIS — R000 Tachycardia, unspecified: Secondary | ICD-10-CM

## 2011-12-17 DIAGNOSIS — R5381 Other malaise: Secondary | ICD-10-CM

## 2011-12-17 DIAGNOSIS — R002 Palpitations: Secondary | ICD-10-CM

## 2011-12-17 DIAGNOSIS — F341 Dysthymic disorder: Secondary | ICD-10-CM

## 2011-12-17 DIAGNOSIS — F418 Other specified anxiety disorders: Secondary | ICD-10-CM

## 2011-12-17 DIAGNOSIS — R5383 Other fatigue: Secondary | ICD-10-CM

## 2011-12-17 DIAGNOSIS — E785 Hyperlipidemia, unspecified: Secondary | ICD-10-CM

## 2011-12-17 DIAGNOSIS — R0602 Shortness of breath: Secondary | ICD-10-CM

## 2011-12-17 MED ORDER — FLUDROCORTISONE ACETATE 0.1 MG PO TABS: 0.1000 mg | ORAL_TABLET | Freq: Every day | ORAL | Status: DC | PRN

## 2011-12-17 NOTE — Progress Notes (Signed)
Patient ID: [name], female    DOB: 16-Aug-1945, 66 y.o.   MRN: [id number]  HPI Comments: 66 yo WF with history of hyperlipidemia,fibromyalgia, extensive GI disease including hiatal hernia, GERD, gastritis, Schatzkes ring s/p dilatations here today for follow up.     previous episodes of heaviness in her chest. Cardiac catheterization in the past showed no significant disease. Holter monitor showed short runs of SVT. 48-hour study.  episodes of shortness of breath, 30 day event monitor showing ABCs but no significant SVT.   She reports that she has significant fatigue. She does feel dizzy at times, wobbly. She's had a difficult year with episode of bronchitis in April and August. Also with sinusitis. He feels she has allergies. She has tightness in her cheeks. Her biggest complaint is lack of energy and inability to do anything. No significant symptoms of tachycardia or palpitations Some diffuse muscle tenderness She is concerned about her low blood pressures. She has numerous blood pressures in the 90 range, rare 80s systolic. She has been losing weight.  Last echocardiogram showed mild MR with no other significant abnormalities. Strong family history of coronary artery disease. Most recent cholesterol 132, HDL 68, LDL 54  EKG today shows normal sinus rhythm with rate 65 beats per minute with no significant ST or T wave changes    Outpatient Encounter Prescriptions as of 12/17/2011  Medication Sig Dispense Refill  . atorvastatin (LIPITOR) 40 MG tablet Take 1 tablet (40 mg total) by mouth daily.  30 tablet  6  . b complex vitamins capsule Take 1 capsule by mouth daily.      . Beclomethasone Dipropionate (QNASL) 80 MCG/ACT AERS Place 2 sprays into the nose daily.      . Biotin 1000 MCG tablet Take 1,000 mcg by mouth daily.        . Chlorpheniramine Maleate (ALLERGY RELIEF PO) Take by mouth. As needed.      [name] esomeprazole (NEXIUM) 40 MG capsule Take 30- 60 min before  your first and last meals of the day  60 capsule  6  . Glucosamine-Fish Oil-EPA-DHA (GLUCOSAMINE & FISH OIL PO) Take 2,000 mg by mouth daily.      . Magnesium Oxide 250 MG TABS Take 250 mg by mouth daily.       . niacin (NIASPAN) 500 MG CR tablet Take 1 tablet (500 mg total) by mouth at bedtime. Take with applesauce  30 tablet  5  . Probiotic Product (ALIGN) 4 MG CAPS Take by mouth daily.       . traZODone (DESYREL) 150 MG tablet Take 150 mg by mouth at bedtime.      . vitamin C (ASCORBIC ACID) 500 MG tablet Take 500 mg by mouth daily.      . fludrocortisone (FLORINEF) 0.1 MG tablet Take 1 tablet (0.1 mg total) by mouth daily as needed.  30 tablet  2  . DISCONTD: traZODone (DESYREL) 100 MG tablet Take 1-2 tablets        Review of Systems  Constitutional: Positive for fatigue.  HENT: Negative.   Eyes: Negative.   Gastrointestinal: Negative.   Musculoskeletal: Negative.   Skin: Negative.   Neurological: Positive for dizziness.  Hematological: Negative.   Psychiatric/Behavioral: Negative.   All other systems reviewed and are negative.    BP 86/60  Pulse 65  Ht 5' 8.5" (1.74 m)  Wt 136 lb (61.689 kg)  BMI 20.38 kg/m2  Physical Exam  Nursing note and vitals reviewed. Constitutional:  She is oriented to person, place, and time. She appears well-developed and well-nourished.  HENT:  Head: Normocephalic.  Nose: Nose normal.  Mouth/Throat: Oropharynx is clear and moist.  Eyes: Conjunctivae normal are normal. Pupils are equal, round, and reactive to light.  Neck: Normal range of motion. Neck supple. No JVD present.  Cardiovascular: Normal rate, regular rhythm, S1 normal, S2 normal, normal heart sounds and intact distal pulses.  Exam reveals no gallop and no friction rub.   No murmur heard. Pulmonary/Chest: Effort normal and breath sounds normal. No respiratory distress. She has no wheezes. She has no rales. She exhibits no tenderness.  Abdominal: Soft. Bowel sounds are normal. She  exhibits no distension. There is no tenderness.  Musculoskeletal: Normal range of motion. She exhibits no edema and no tenderness.  Lymphadenopathy:    She has no cervical adenopathy.  Neurological: She is alert and oriented to person, place, and time. Coordination normal.  Skin: Skin is warm and dry. No rash noted. No erythema.  Psychiatric: She has a normal mood and affect. Her behavior is normal. Judgment and thought content normal.         Assessment and Plan

## 2011-12-17 NOTE — Patient Instructions (Addendum)
Please hold lipitor  We will check cholesterol in 2 months, fasting  Please start florinef one pill three times a week (mon/wed/friday) If your blood pressure continues to run low after two weeks, increase to one a day on Monday through Friday  Please call us if you have new issues that need to be addressed before your next appt.  Your physician wants you to follow-up in: 3 months.

## 2011-12-17 NOTE — Assessment & Plan Note (Signed)
She has diffuse ache and pain. We have suggested she hold the Lipitor for now and recheck cholesterol in 2 months.

## 2011-12-17 NOTE — Assessment & Plan Note (Addendum)
Chronic fatigue. She is concerned about her low blood pressure causing her symptoms. Uncertain if this is related or not. Minimal symptoms of lightheadedness and dizziness. She's interested in medical options for her low blood pressure. She's not interested in excess salt loading. She reports that she does drink significant fluids. We will start fludrocortisone 0.1 mg 3 times per week with very slow titration upwards. She will monitor her blood pressure at home. Unable to exclude depression as a cause of her symptoms

## 2011-12-17 NOTE — Assessment & Plan Note (Signed)
No symptoms concerning for SVT. No further workup needed at this time.

## 2011-12-17 NOTE — Assessment & Plan Note (Signed)
Notes indicate prior history of depression. Uncertain if this is playing a role in her presentation.

## 2011-12-23 ENCOUNTER — Other Ambulatory Visit: Payer: Self-pay

## 2011-12-23 ENCOUNTER — Telehealth: Payer: Self-pay | Admitting: Gastroenterology

## 2011-12-23 MED ORDER — ESOMEPRAZOLE MAGNESIUM 40 MG PO CPDR: DELAYED_RELEASE_CAPSULE | ORAL | Status: DC

## 2011-12-23 NOTE — Telephone Encounter (Signed)
rx sent. Patient needs recall colonoscopy for further refills.

## 2012-01-05 ENCOUNTER — Telehealth: Payer: Self-pay

## 2012-01-05 NOTE — Telephone Encounter (Signed)
Pt dropped off BP readings: 92/58 96/59 107/62 91/51 89/57  81/56 101/59 95/55 111/65 97/56 97/60  95/57 Confirms she is taking florinef MWF I explained, per Dr. Windell Hummingbird last note, if BP does not increase with Florinef MWF she may increase to qd daily M-F. She wants me to address with Dr. Mariah Milling and call her back.

## 2012-01-06 NOTE — Telephone Encounter (Signed)
Pt was notified.  

## 2012-01-06 NOTE — Telephone Encounter (Signed)
I think with low blood pressures, would increase this to daily Closely monitor blood pressures with daily dosing Call for worsening edema

## 2012-01-07 ENCOUNTER — Telehealth: Payer: Self-pay | Admitting: Gastroenterology

## 2012-01-07 MED ORDER — ESOMEPRAZOLE MAGNESIUM 40 MG PO CPDR: DELAYED_RELEASE_CAPSULE | ORAL | Status: DC

## 2012-01-07 NOTE — Telephone Encounter (Signed)
Pt had a very intense workup in 2012 and 2013; her last COLON was 05/31/10 then a flex sig and 24 hr PH Probe as well as Esophageal Manometry. Spoke with Dr Jarold Motto and pt does not need a repeat COLON until 2022.

## 2012-01-23 ENCOUNTER — Other Ambulatory Visit: Payer: Self-pay | Admitting: *Deleted

## 2012-01-23 MED ORDER — ESOMEPRAZOLE MAGNESIUM 40 MG PO CPDR: DELAYED_RELEASE_CAPSULE | ORAL | Status: DC

## 2012-01-23 NOTE — Telephone Encounter (Signed)
Via fax from [facility] patient needs refill on Nexium

## 2012-02-02 ENCOUNTER — Telehealth: Payer: Self-pay

## 2012-02-02 NOTE — Telephone Encounter (Signed)
Pt dropped off BP readings: 103/61 100/61 94/58 84/55  95/53 90/57 107/62 88/56 92/50  108/62 88/56 110/66 103/59 100/63 HR=60-76 BPM

## 2012-02-03 NOTE — Telephone Encounter (Signed)
How much Florinef is she taking? Does she feel better? If she still has significant fatigue, we could try to push her blood pressure upwards by increasing the dose

## 2012-02-04 NOTE — Telephone Encounter (Signed)
Would try midodrine 5 mg twice a day with Florinef 0.1 mg daily Would take midodrine in the morning and early afternoon to start

## 2012-02-04 NOTE — Telephone Encounter (Signed)
Pt says she is taking 0.1 mg qd of florinef Says she is still feeling fatigued and "wobbly" at times I advised to try increasing to 2 tablets daily of the 0.1 mg tablets to see if this helps She will try this and will call us back with new BP readings

## 2012-02-05 ENCOUNTER — Other Ambulatory Visit: Payer: Self-pay

## 2012-02-05 MED ORDER — MIDODRINE HCL 5 MG PO TABS: 5.0000 mg | ORAL_TABLET | Freq: Two times a day (BID) | ORAL | Status: DC

## 2012-02-05 MED ORDER — FLUDROCORTISONE ACETATE 0.1 MG PO TABS: 0.1000 mg | ORAL_TABLET | Freq: Every day | ORAL | Status: DC

## 2012-02-05 NOTE — Telephone Encounter (Signed)
Pt informed Understanding verb New RX sent to pharm  

## 2012-02-16 ENCOUNTER — Encounter: Payer: Self-pay | Admitting: Cardiovascular Disease

## 2012-02-16 ENCOUNTER — Ambulatory Visit (INDEPENDENT_AMBULATORY_CARE_PROVIDER_SITE_OTHER): Payer: 59 | Admitting: Cardiovascular Disease

## 2012-02-16 VITALS — BP 100/70 | HR 66 | Ht 68.5 in | Wt 136.0 lb

## 2012-02-16 DIAGNOSIS — E785 Hyperlipidemia, unspecified: Secondary | ICD-10-CM

## 2012-02-16 DIAGNOSIS — I4902 Ventricular flutter: Secondary | ICD-10-CM

## 2012-02-16 DIAGNOSIS — I951 Orthostatic hypotension: Secondary | ICD-10-CM | POA: Insufficient documentation

## 2012-02-16 DIAGNOSIS — I498 Other specified cardiac arrhythmias: Secondary | ICD-10-CM

## 2012-02-16 MED ORDER — FLUDROCORTISONE ACETATE 0.1 MG PO TABS: 0.1000 mg | ORAL_TABLET | Freq: Every day | ORAL | Status: DC

## 2012-02-16 MED ORDER — MIDODRINE HCL 10 MG PO TABS: 10.0000 mg | ORAL_TABLET | Freq: Three times a day (TID) | ORAL | Status: DC

## 2012-02-16 NOTE — Progress Notes (Signed)
Patient ID: [name], female    DOB: 1945/06/20, 66 y.o.   MRN: [id number]  HPI Comments: 66 yo WF with history of hyperlipidemia,fibromyalgia, extensive GI disease including hiatal hernia, GERD, gastritis, Schatzkes ring s/p dilatations here today for follow up.  She has chronically low blood pressure and associated dizziness and fatigue. She also reports having obstructive sleep apnea, significant snoring and CPAP. She's not wearing her CPAP and it is years outdated.  On previous office visits, she was started on low-dose midodrine and Florinef. She has been taking Florinef 0.1 mg daily and midodrine 5 mg at lunch and dinner. Some improvement in her dizziness. She continues to have some symptoms, particularly in the evenings.    previous episodes of heaviness in her chest. Cardiac catheterization in the past showed no significant disease. Holter monitor showed short runs of SVT. 48-hour study.  episodes of shortness of breath, 30 day event monitor showing ABCs but no significant SVT.   Her biggest complaint is lack of energy and inability to do anything. No significant symptoms of tachycardia or palpitations We have discussed weight loss with her in the past.  Last echocardiogram showed mild MR with no other significant abnormalities. Strong family history of coronary artery disease. Most recent cholesterol 132, HDL 68, LDL 54  EKG today shows normal sinus rhythm with rate 66 beats per minute with no significant ST or T wave changes    Outpatient Encounter Prescriptions as of 02/16/2012  Medication Sig Dispense Refill  . b complex vitamins capsule Take 1 capsule by mouth daily.      . Beclomethasone Dipropionate (QNASL) 80 MCG/ACT AERS Place 2 sprays into the nose daily.      . Biotin 1000 MCG tablet Take 1,000 mcg by mouth daily.        . Chlorpheniramine Maleate (ALLERGY RELIEF PO) Take by mouth. As needed.      [name] esomeprazole (NEXIUM) 40 MG capsule Please take one  capsule 30-60 minutes before breakfast and dinner  60 capsule  4  . fludrocortisone (FLORINEF) 0.1 MG tablet Take 1 tablet (0.1 mg total) by mouth daily.  30 tablet  6  . Glucosamine-Fish Oil-EPA-DHA (GLUCOSAMINE & FISH OIL PO) Take 2,000 mg by mouth daily.      . Magnesium Oxide 250 MG TABS Take 250 mg by mouth daily.       . midodrine (PROAMATINE) 10 MG tablet Take 1 tablet (10 mg total) by mouth 3 (three) times daily.  90 tablet  6  . niacin (NIASPAN) 500 MG CR tablet Take 1 tablet (500 mg total) by mouth at bedtime. Take with applesauce  30 tablet  5  . Probiotic Product (ALIGN) 4 MG CAPS Take by mouth daily.       . traZODone (DESYREL) 150 MG tablet Take 150 mg by mouth at bedtime.      . vitamin C (ASCORBIC ACID) 500 MG tablet Take 500 mg by mouth daily.      .  midodrine (PROAMATINE) 5 MG tablet Take 1 tablet (5 mg total) by mouth 2 (two) times daily.  60 tablet  3    Review of Systems  Constitutional: Positive for fatigue.  HENT: Negative.   Eyes: Negative.   Respiratory: Negative.   Cardiovascular: Negative.   Gastrointestinal: Negative.   Musculoskeletal: Negative.   Skin: Negative.   Neurological: Positive for dizziness.  Hematological: Negative.   Psychiatric/Behavioral: Negative.   All other systems reviewed and are negative.  BP 100/70  Pulse 66  Ht 5' 8.5" (1.74 m)  Wt 136 lb (61.689 kg)  BMI 20.38 kg/m2  Physical Exam  Nursing note and vitals reviewed. Constitutional: She is oriented to person, place, and time. She appears well-developed.       Thin  HENT:  Head: Normocephalic.  Nose: Nose normal.  Mouth/Throat: Oropharynx is clear and moist.  Eyes: Conjunctivae normal are normal. Pupils are equal, round, and reactive to light.  Neck: Normal range of motion. Neck supple. No JVD present.  Cardiovascular: Normal rate, regular rhythm, S1 normal, S2 normal, normal heart sounds and intact distal pulses.  Exam reveals no gallop and no friction rub.   No murmur  heard. Pulmonary/Chest: Effort normal and breath sounds normal. No respiratory distress. She has no wheezes. She has no rales. She exhibits no tenderness.  Abdominal: Soft. Bowel sounds are normal. She exhibits no distension. There is no tenderness.  Musculoskeletal: Normal range of motion. She exhibits no edema and no tenderness.  Lymphadenopathy:    She has no cervical adenopathy.  Neurological: She is alert and oriented to person, place, and time. Coordination normal.  Skin: Skin is warm and dry. No rash noted. No erythema.  Psychiatric: She has a normal mood and affect. Her behavior is normal. Judgment and thought content normal.         Assessment and Plan

## 2012-02-16 NOTE — Assessment & Plan Note (Signed)
She is interested in rechecking her cholesterol. This we done at her convenience. She's not fasting today.

## 2012-02-16 NOTE — Patient Instructions (Addendum)
You are doing well. Please increase the midodrine to 10 mg three times a day Monitor your blood pressure If you continue to run low, call the office. We could increase the florinef to 0.2 mg daily  Please call us if you have new issues that need to be addressed before your next appt.  Your physician wants you to follow-up in: 3 months.  You will receive a reminder letter in the mail two months in advance. If you don't receive a letter, please call our office to schedule the follow-up appointment.

## 2012-02-16 NOTE — Assessment & Plan Note (Addendum)
In an attempt to improve her dizziness symptoms and chronic fatigue, we have been trying to increase her blood pressure with midodrine and Florinef. Some improvement in her symptoms per the patient. Continue dizziness at times in the daytime. Blood pressure today 100 systolic. We have suggested she take midodrine 3 times a day. If she continues to have symptoms, this could be increased to 10 mg  3 times a day. Other options include increasing Florinef to 0.2 mg daily . We have encouraged her to continue aggressive fluid intake and salt .  Low blood pressure likely from low general body weight.

## 2012-02-17 ENCOUNTER — Ambulatory Visit (INDEPENDENT_AMBULATORY_CARE_PROVIDER_SITE_OTHER): Payer: 59

## 2012-02-17 DIAGNOSIS — E785 Hyperlipidemia, unspecified: Secondary | ICD-10-CM

## 2012-02-18 LAB — LIPID PANEL
Cholesterol, Total: 209 mg/dL — ABNORMAL HIGH (ref 100–199)
HDL: 69 mg/dL (ref >39)
VLDL Cholesterol Cal: 16 mg/dL (ref 5–40)

## 2012-02-23 ENCOUNTER — Other Ambulatory Visit: Payer: Self-pay | Admitting: Internal Medicine

## 2012-02-23 DIAGNOSIS — E785 Hyperlipidemia, unspecified: Secondary | ICD-10-CM

## 2012-02-23 MED ORDER — NIACIN ER (ANTIHYPERLIPIDEMIC) 500 MG PO TBCR: 500.0000 mg | EXTENDED_RELEASE_TABLET | Freq: Every day | ORAL | Status: DC

## 2012-02-23 NOTE — Telephone Encounter (Signed)
refill Niacin ER 500 MG Take 1 tablet (500 mg total) by mouth at bedtime. Take with applesauce  #30 last fill 11.22.13

## 2012-02-23 NOTE — Telephone Encounter (Signed)
RX sent

## 2012-02-27 ENCOUNTER — Telehealth: Payer: Self-pay | Admitting: Cardiovascular Disease

## 2012-02-27 NOTE — Telephone Encounter (Signed)
Pt calling for results of her cholesterol lab work.  Please call to advise.

## 2012-02-27 NOTE — Telephone Encounter (Signed)
Please review labs and advise thanks

## 2012-03-01 ENCOUNTER — Telehealth: Payer: Self-pay | Admitting: Internal Medicine

## 2012-03-01 NOTE — Telephone Encounter (Signed)
Patient states she needs refill on Symbicort sent to [facility] pharmacy.

## 2012-03-01 NOTE — Telephone Encounter (Signed)
Pt calling back asking for lab results. Informed pt that message was sent to [facility] and that he however is on vacation this week.

## 2012-03-02 MED ORDER — BUDESONIDE-FORMOTEROL FUMARATE 160-4.5 MCG/ACT IN AERO: INHALATION_SPRAY | RESPIRATORY_TRACT | Status: DC

## 2012-03-02 NOTE — Telephone Encounter (Signed)
Symbicort one-2  inhalations every 12 hours; gargle and spit after use  

## 2012-03-02 NOTE — Telephone Encounter (Signed)
RX sent

## 2012-03-02 NOTE — Telephone Encounter (Signed)
Hopp, Symbicort is not on medication list, please advise

## 2012-03-05 NOTE — Telephone Encounter (Signed)
Please review labs thanks

## 2012-03-06 NOTE — Telephone Encounter (Signed)
Large jump in numbers without lipitor Cholesterol >200 Would she like to restart something? Perhaps over the counter red yeast rice? Or statin, welchol BID?

## 2012-03-08 NOTE — Telephone Encounter (Signed)
Pt informed Understanding verb Would like to try red yeast rice OTC before starting RX med Will recheck L/L in 6 weeks

## 2012-03-23 ENCOUNTER — Ambulatory Visit: Payer: 59

## 2012-03-29 ENCOUNTER — Encounter: Payer: Self-pay | Admitting: Family Medicine

## 2012-03-29 ENCOUNTER — Ambulatory Visit (INDEPENDENT_AMBULATORY_CARE_PROVIDER_SITE_OTHER): Payer: 59 | Admitting: Family Medicine

## 2012-03-29 VITALS — BP 108/62 | HR 71 | Temp 98.5°F | Wt 136.0 lb

## 2012-03-29 DIAGNOSIS — J329 Chronic sinusitis, unspecified: Secondary | ICD-10-CM

## 2012-03-29 DIAGNOSIS — Z9109 Other allergy status, other than to drugs and biological substances: Secondary | ICD-10-CM

## 2012-03-29 DIAGNOSIS — Z889 Allergy status to unspecified drugs, medicaments and biological substances status: Secondary | ICD-10-CM

## 2012-03-29 MED ORDER — CEFUROXIME AXETIL 500 MG PO TABS: 500.0000 mg | ORAL_TABLET | Freq: Two times a day (BID) | ORAL | Status: AC

## 2012-03-29 NOTE — Patient Instructions (Addendum)

## 2012-03-29 NOTE — Progress Notes (Signed)
  Subjective:     [name] is a 67 y.o. female who presents for evaluation of sinus pain. Symptoms include: congestion, cough, headaches, nasal congestion, sinus pressure, sneezing and chills. Onset of symptoms was 1 month ago. Symptoms have been gradually worsening since that time. Past history is significant for no history of pneumonia or bronchitis. Patient is a non-smoker.  The following portions of the patient's history were reviewed and updated as appropriate: allergies, current medications, past family history, past medical history, past social history, past surgical history and problem list.  Review of Systems Pertinent items are noted in HPI.   Objective:    BP 108/62  Pulse 71  Temp 98.5 F (36.9 C) (Oral)  Wt 136 lb (61.689 kg)  SpO2 95% General appearance: alert, cooperative and no distress Ears: normal TM's and external ear canals both ears Nose: green discharge, moderate congestion, turbinates red, swollen, sinus tenderness bilateral Throat: lips, mucosa, and tongue normal; teeth and gums normal Neck: mild anterior cervical adenopathy, supple, symmetrical, trachea midline and thyroid not enlarged, symmetric, no tenderness/mass/nodules Lungs: clear to auscultation bilaterally    Assessment:    Acute bacterial sinusitis.    Plan:    Nasal steroids per medication orders. Antihistamines per medication orders. Ceftin per medication orders.

## 2012-04-07 ENCOUNTER — Other Ambulatory Visit: Payer: Self-pay | Admitting: Otolaryngology

## 2012-04-07 DIAGNOSIS — R51 Headache: Secondary | ICD-10-CM

## 2012-04-17 ENCOUNTER — Other Ambulatory Visit: Payer: Self-pay

## 2012-04-19 ENCOUNTER — Telehealth: Payer: Self-pay

## 2012-04-19 ENCOUNTER — Other Ambulatory Visit: Payer: Self-pay

## 2012-04-19 DIAGNOSIS — E785 Hyperlipidemia, unspecified: Secondary | ICD-10-CM

## 2012-04-19 NOTE — Telephone Encounter (Signed)
LL

## 2012-04-19 NOTE — Telephone Encounter (Signed)
Reminded pt she is due for L/L She will try to come this week for fasting labs

## 2012-04-20 ENCOUNTER — Telehealth: Payer: Self-pay | Admitting: *Deleted

## 2012-04-20 ENCOUNTER — Ambulatory Visit (INDEPENDENT_AMBULATORY_CARE_PROVIDER_SITE_OTHER): Payer: 59

## 2012-04-20 DIAGNOSIS — E785 Hyperlipidemia, unspecified: Secondary | ICD-10-CM

## 2012-04-20 NOTE — Telephone Encounter (Signed)
Pt is in question if she should continue using the niacin 500 MG qd. She mentioned that she has not used the red yeast rice that Dr.Gollan had told her she could take for cholesterol, Instead pt has started taking her lipitor 40 mg qd. Please Advise.

## 2012-04-20 NOTE — Telephone Encounter (Signed)
LMOM ok to continue taking meds as she has been and we will reassess after L/L is checked this week

## 2012-04-21 ENCOUNTER — Institutional Professional Consult (permissible substitution): Payer: 59 | Admitting: Internal Medicine

## 2012-04-21 ENCOUNTER — Ambulatory Visit
Admission: RE | Admit: 2012-04-21 | Discharge: 2012-04-21 | Disposition: A | Discharge status: Home / Self Care | Payer: 59 | Source: Ambulatory Visit | Attending: Otolaryngology | Admitting: Otolaryngology

## 2012-04-21 DIAGNOSIS — R51 Headache: Secondary | ICD-10-CM

## 2012-04-21 LAB — HEPATIC FUNCTION PANEL
ALT: 11 IU/L (ref 0–32)
AST: 16 IU/L (ref 0–40)
Albumin: 4.3 g/dL (ref 3.6–4.8)
Total Bilirubin: 0.4 mg/dL (ref 0.0–1.2)

## 2012-04-21 LAB — LIPID PANEL
Chol/HDL Ratio: 2.4 ratio units (ref 0.0–4.4)
LDL Calculated: 66 mg/dL (ref 0–99)
VLDL Cholesterol Cal: 11 mg/dL (ref 5–40)

## 2012-05-03 ENCOUNTER — Institutional Professional Consult (permissible substitution): Payer: 59 | Admitting: Internal Medicine

## 2012-05-04 ENCOUNTER — Institutional Professional Consult (permissible substitution): Payer: 59 | Admitting: Internal Medicine

## 2012-05-13 ENCOUNTER — Ambulatory Visit: Payer: 59 | Admitting: Cardiovascular Disease

## 2012-05-19 ENCOUNTER — Other Ambulatory Visit: Payer: Self-pay | Admitting: Otolaryngology

## 2012-05-19 DIAGNOSIS — R51 Headache: Secondary | ICD-10-CM

## 2012-05-20 ENCOUNTER — Ambulatory Visit
Admission: RE | Admit: 2012-05-20 | Discharge: 2012-05-20 | Disposition: A | Discharge status: Home / Self Care | Payer: 59 | Source: Ambulatory Visit | Attending: Otolaryngology | Admitting: Otolaryngology

## 2012-05-20 DIAGNOSIS — R51 Headache: Secondary | ICD-10-CM

## 2012-05-25 ENCOUNTER — Ambulatory Visit: Payer: 59 | Admitting: Cardiovascular Disease

## 2012-05-26 ENCOUNTER — Other Ambulatory Visit: Payer: Self-pay | Admitting: Otolaryngology

## 2012-06-07 ENCOUNTER — Institutional Professional Consult (permissible substitution): Payer: 59 | Admitting: Internal Medicine

## 2012-06-08 ENCOUNTER — Encounter: Payer: Self-pay | Admitting: Cardiovascular Disease

## 2012-06-08 ENCOUNTER — Ambulatory Visit (INDEPENDENT_AMBULATORY_CARE_PROVIDER_SITE_OTHER): Payer: 59 | Admitting: Cardiovascular Disease

## 2012-06-08 VITALS — BP 86/64 | HR 61 | Ht 68.5 in | Wt 138.5 lb

## 2012-06-08 DIAGNOSIS — R5381 Other malaise: Secondary | ICD-10-CM

## 2012-06-08 DIAGNOSIS — F418 Other specified anxiety disorders: Secondary | ICD-10-CM

## 2012-06-08 DIAGNOSIS — F341 Dysthymic disorder: Secondary | ICD-10-CM

## 2012-06-08 DIAGNOSIS — R000 Tachycardia, unspecified: Secondary | ICD-10-CM

## 2012-06-08 MED ORDER — MIDODRINE HCL 10 MG PO TABS: 10.0000 mg | ORAL_TABLET | Freq: Three times a day (TID) | ORAL | Status: DC

## 2012-06-08 NOTE — Assessment & Plan Note (Signed)
Wonder if this could be contributing to her fatigue. Will defer to her primary care physician

## 2012-06-08 NOTE — Patient Instructions (Addendum)
Please retry midodrine three times a day for low blood pressure Goal blood pressure is greater 100 to 110 on the top number  Please call us if you have new issues that need to be addressed before your next appt.

## 2012-06-08 NOTE — Assessment & Plan Note (Signed)
Her biggest complaint today is her fatigue. Uncertain if low blood pressure is contributing to her symptoms though this seemed to persist in the past despite Florinef and midodrine. She is willing to retry midodrine in an effort to improve her symptoms. Unable to exclude poor sleep hygiene as a cause for fatigue or other causes such as mild depression. Heart rate is in a reasonable range.

## 2012-06-08 NOTE — Progress Notes (Signed)
Patient ID: [name], female    DOB: 04-26-1945, 67 y.o.   MRN: [id number]  HPI Comments: 67 yo WF with history of hyperlipidemia,fibromyalgia, extensive GI disease including hiatal hernia, GERD, gastritis, Schatzkes ring s/p dilatations here today for follow up.  She has chronically low blood pressure and in the past has had chronic dizziness and fatigue. She also reports having obstructive sleep apnea, significant snoring and CPAP. She's not wearing her CPAP and it is years outdated.  Recent sinus surgery and reports her breathing is better. Husband reports her snoring has improved. She continues to have significant fatigue. She stopped all of her Florinef and midodrine which was initially started for hypotension in an effort to improve her fatigue. She is uncertain if there was much improvement with or without the medications. She seems to have recovered well from her sinus surgery though concerned about the anesthesia and now changes of her memory. Previously she was taking Florinef 0.1 mg daily and midodrine twice a day with some improvement of her dizziness.  lack of energy and inability to do anything has been a chronic issue. No significant symptoms of tachycardia or palpitations   previous episodes of heaviness in her chest. Cardiac catheterization in the past showed no significant disease. Holter monitor showed short runs of SVT. 48-hour study.  episodes of shortness of breath, 30 day event monitor showing ABCs but no significant SVT.   Last echocardiogram showed mild MR with no other significant abnormalities. Strong family history of coronary artery disease.  cholesterol 132, HDL 68, LDL 54  EKG today shows normal sinus rhythm with rate 61 beats per minute with no significant ST or T wave changes    Outpatient Encounter Prescriptions as of 06/08/2012  Medication Sig Dispense Refill  . b complex vitamins capsule Take 1 capsule by mouth daily.      . Biotin 1000  MCG tablet Take 1,000 mcg by mouth daily.        . Chlorpheniramine Maleate (ALLERGY RELIEF PO) Take by mouth. As needed.      [name] esomeprazole (NEXIUM) 40 MG capsule 2 (two) times daily.      . Glucosamine-Fish Oil-EPA-DHA (GLUCOSAMINE & FISH OIL PO) Take 2,000 mg by mouth daily.      . Magnesium Oxide 250 MG TABS Take 250 mg by mouth daily.       . niacin (NIASPAN) 500 MG CR tablet Take 1 tablet (500 mg total) by mouth at bedtime. Take with applesauce  30 tablet  5  . Probiotic Product (ALIGN) 4 MG CAPS Take by mouth daily.       . traZODone (DESYREL) 150 MG tablet Take 150 mg by mouth at bedtime.      . [DISCONTINUED] esomeprazole (NEXIUM) 40 MG capsule Please take one capsule 30-60 minutes before breakfast and dinner  60 capsule  4  . midodrine (PROAMATINE) 10 MG tablet Take 1 tablet (10 mg total) by mouth 3 (three) times daily.  90 tablet  6  . vitamin C (ASCORBIC ACID) 500 MG tablet Take 500 mg by mouth daily.        Review of Systems  Constitutional: Positive for fatigue.  HENT: Negative.   Eyes: Negative.   Respiratory: Negative.   Cardiovascular: Negative.   Gastrointestinal: Negative.   Musculoskeletal: Negative.   Skin: Negative.   Psychiatric/Behavioral: Negative.   All other systems reviewed and are negative.    BP 86/64  Pulse 61  Ht 5' 8.5" (1.74 m)  Wt 138 lb 8 oz (62.823 kg)  BMI 20.75 kg/m2  Physical Exam  Nursing note and vitals reviewed. Constitutional: She is oriented to person, place, and time. She appears well-developed.  Thin  HENT:  Head: Normocephalic.  Nose: Nose normal.  Mouth/Throat: Oropharynx is clear and moist.  Eyes: Conjunctivae are normal. Pupils are equal, round, and reactive to light.  Neck: Normal range of motion. Neck supple. No JVD present.  Cardiovascular: Normal rate, regular rhythm, S1 normal, S2 normal, normal heart sounds and intact distal pulses.  Exam reveals no gallop and no friction rub.   No murmur heard. Pulmonary/Chest:  Effort normal and breath sounds normal. No respiratory distress. She has no wheezes. She has no rales. She exhibits no tenderness.  Abdominal: Soft. Bowel sounds are normal. She exhibits no distension. There is no tenderness.  Musculoskeletal: Normal range of motion. She exhibits no edema and no tenderness.  Lymphadenopathy:    She has no cervical adenopathy.  Neurological: She is alert and oriented to person, place, and time. Coordination normal.  Skin: Skin is warm and dry. No rash noted. No erythema.  Psychiatric: She has a normal mood and affect. Her behavior is normal. Judgment and thought content normal.    Assessment and Plan

## 2012-07-06 ENCOUNTER — Encounter: Payer: Self-pay | Admitting: Internal Medicine

## 2012-07-06 ENCOUNTER — Ambulatory Visit (INDEPENDENT_AMBULATORY_CARE_PROVIDER_SITE_OTHER): Payer: 59 | Admitting: Internal Medicine

## 2012-07-06 VITALS — BP 122/78 | HR 90 | Wt 133.4 lb

## 2012-07-06 DIAGNOSIS — F411 Generalized anxiety disorder: Secondary | ICD-10-CM

## 2012-07-06 DIAGNOSIS — R0989 Other specified symptoms and signs involving the circulatory and respiratory systems: Secondary | ICD-10-CM

## 2012-07-06 MED ORDER — SERTRALINE HCL 50 MG PO TABS: ORAL_TABLET | ORAL | Status: DC

## 2012-07-06 NOTE — Assessment & Plan Note (Signed)
TSH was normal in September 2013. 24 urine for catecholamines and metanephrines were negative in May of 2011.  Titration of sertraline as trial

## 2012-07-06 NOTE — Patient Instructions (Addendum)
Please reconsider taking the agent to raise the neurotransmitters which are essential for good brain function, both intellectual & emotional health. These agents are not addictive and simply keep this essential neurotransmitter at therapeutic levels. If these levels become severely depleted; depression or panic attacks can occur.   Please review the mental health benefits available to you; this will be  on the back of your insurance card as a 1- 800-number.

## 2012-07-06 NOTE — Progress Notes (Signed)
  Subjective:    Patient ID: [name], female    DOB: 04-Nov-1945, 67 y.o.   MRN: [id number]  HPI    She describes an increase in anxiety since April of this year in the context of family stresses.  She mentioned panic attacks but her main symptoms are described as "deep chills" and being "jittery as if my insides would jump out". She also notices some tightness in her throat. She has some headaches and fast rhythm. She also has noted some chest heaviness. Flushing occurs intermittently.  She denies depression but her appetite is poor and she has difficulty going to sleep even with Trazadone. She denies anhedonia but must push herself to complete activities.No suicidal ideation.  She has been seen by Dr. Mariah Milling cardiologist with extensive evaluation.  The past history and family history in reference to anxiety were reviewed and updated in problem list   Review of Systems  She denies a specific constellation of headache, flushing, chest pain, and diarrhea.  24 urine for catecholamines and metanephrines were negative in May 2011.  Ultrasound aorta was normal in July 2011.     Objective:   Physical Exam Gen.: Thin but healthy and well-nourished in appearance. Alert, appropriate and cooperative throughout exam.Appears younger than stated age  Head: Normocephalic without obvious abnormalities  Eyes: No corneal or conjunctival inflammation noted. No ptosis or lid lag Extraocular motion intact.   Neck: No deformities, masses, or tenderness noted.  Thyroid slightly irregular texture w/o nodules. Lungs: Normal respiratory effort; chest expands symmetrically. Lungs are clear to auscultation without rales, wheezes, or increased work of breathing. Heart: Normal rate and rhythm. Increased  S2. No gallop, click, or rub. No murmur. Abdomen: Bowel sounds normal; abdomen soft and nontender. No masses, organomegaly or hernias noted. Aortic bruit present; the aorta is easy to palpate  and measures 3 cm.                               Musculoskeletal/extremities: No clubbing, cyanosis, edema, or significant extremity  deformity noted. Tone & strength  Normal. Joints normal ; long fingers. Nail health good. Able to lie down & sit up w/o help.  Vascular: Carotid, radial artery, dorsalis pedis and  posterior tibial pulses are full and equal.  Neurologic: Alert and oriented x3. Deep tendon reflexes symmetrical and normal. No tremor Gait normal  including heel & toe walking .        Skin: Intact without suspicious lesions or rashes. Lymph: No cervical, axillary lymphadenopathy present. Psych: Mood and affect flat; but normally interactive                                                                                        Assessment & Plan:

## 2012-07-07 ENCOUNTER — Telehealth: Payer: Self-pay | Admitting: Internal Medicine

## 2012-07-07 NOTE — Telephone Encounter (Signed)
Pt has questions about rx

## 2012-07-07 NOTE — Telephone Encounter (Signed)
Pharmacist told pt that she may only want to take a 1/2 tablet of the sertraline 50mg  due to her trazadone actually being at 300mg  not 150mg  like our chart states. Please advise if it would be better to cut back on the Trazadone or to change the med from Sertraline.   Pt states that she also has been having colitis flare-ups for the month of April states that when she gets upset this flares up, says you had asked her if she was having any diarrhea symptoms and she forgot to tell you.

## 2012-07-07 NOTE — Telephone Encounter (Signed)
Pt advised of Hopp's instructions and told to let our office know if there are any difficulties.

## 2012-07-07 NOTE — Telephone Encounter (Signed)
Decrease trazodone to one half pill at bedtime. Take the sertraline in the morning one half pill daily for 8-10 days then one daily thereafter. 50 mg daily which still would be at low dose for sertraline.

## 2012-07-27 ENCOUNTER — Institutional Professional Consult (permissible substitution): Payer: 59 | Admitting: Internal Medicine

## 2012-07-28 ENCOUNTER — Telehealth: Payer: Self-pay | Admitting: *Deleted

## 2012-07-28 MED ORDER — ATORVASTATIN CALCIUM 40 MG PO TABS: 40.0000 mg | ORAL_TABLET | Freq: Every day | ORAL | Status: DC

## 2012-07-28 NOTE — Telephone Encounter (Signed)
Refill sent for atorvastatin 40 mg take one tablet daily.  

## 2012-07-28 NOTE — Telephone Encounter (Signed)
Patient needs refill on atorvastatin [place].  This is not on her medication and will need to be verified.

## 2012-09-08 ENCOUNTER — Other Ambulatory Visit: Payer: Self-pay | Admitting: Internal Medicine

## 2012-09-08 ENCOUNTER — Telehealth: Payer: Self-pay | Admitting: Internal Medicine

## 2012-09-08 DIAGNOSIS — G609 Hereditary and idiopathic neuropathy, unspecified: Secondary | ICD-10-CM

## 2012-09-08 NOTE — Telephone Encounter (Signed)
Hopp please advise  

## 2012-09-08 NOTE — Telephone Encounter (Signed)
Patient states that Dr. Alwyn Ren recommended that she go see Dr. Lesia Sago at [facility] for her Neurological concerns. Patient states that she called his office and they need a referral from our office. Patient wants to know if we can send one to them.

## 2012-09-08 NOTE — Telephone Encounter (Signed)
Discuss with patient  

## 2012-09-08 NOTE — Telephone Encounter (Signed)
Specialists & insurance organizations  require an updated, current  assessment and written note from the Primary Care physician  to review before they  schedule an appointment to assess symptoms or problems. As we do not have such  a current  assessment of your peripheral neuropathy in the chart (electronic medical record);you will need to  make an appointment to create this document THEY REQUIRE. It will be necessary to know prior evaluations and treatments of this symptom and response to these interventions. Please bring that medical history & all medications & supplements to that appointment so I can complete the required document.Please have labs drawn before that OV (orders entered)

## 2012-09-23 ENCOUNTER — Ambulatory Visit: Payer: 59 | Admitting: Gynecology

## 2012-09-29 ENCOUNTER — Ambulatory Visit: Payer: Self-pay | Admitting: Gynecology

## 2012-09-29 ENCOUNTER — Telehealth: Payer: Self-pay | Admitting: Internal Medicine

## 2012-09-29 NOTE — Telephone Encounter (Signed)
Patient called stating she is on vacation in [place] and has developed burning and frequency of urination. She is requesting that Dr. Alwyn Ren send her some medication to [facility] in [place] [facility] [place] fax # [phone]

## 2012-09-29 NOTE — Telephone Encounter (Signed)
Advised patient that she would need to be seen for antibiotics. Advised to go to an Urgent Care where she is.

## 2012-10-06 ENCOUNTER — Telehealth: Payer: Self-pay | Admitting: Gastroenterology

## 2012-10-06 NOTE — Telephone Encounter (Signed)
Stool C.diff and see PA

## 2012-10-06 NOTE — Telephone Encounter (Signed)
Informed pt she needs to give a stool sample for CDIFF and she needs to see one of our PAs. Pt states she cannot come in d/t the diarrhea and she has no one to pick up a container. She will wait to see if she gets better and call back.

## 2012-10-06 NOTE — Telephone Encounter (Signed)
Pt with hx of Lymphocytic Colitis, IBS, Divertiiculitis and Chronic LLQ Pain; Flex Sig 04/16/11 with path showing Lymphocytic Colitis and pt was placed on Entocort. Today pt reports she developed an UTI at [facility], went to an UC and was given Nitrofurantoin 200 mg BID. Pt reports she has developed diarrhea and abdominal cramping. She reports the diarrhea is so bad she can't leave home and she's afraid her Colitis will act up again. Pt wants something for the diarrhea and for [place] to change the AB. Informed her we probably can't change the AB. Any suggestions? Thanks.

## 2012-10-08 ENCOUNTER — Telehealth: Payer: Self-pay | Admitting: Gastroenterology

## 2012-10-08 ENCOUNTER — Other Ambulatory Visit: Payer: 59

## 2012-10-08 ENCOUNTER — Encounter: Payer: Self-pay | Admitting: *Deleted

## 2012-10-08 DIAGNOSIS — R197 Diarrhea, unspecified: Secondary | ICD-10-CM

## 2012-10-08 NOTE — Telephone Encounter (Signed)
Pt asked about what kind of container her stool sample goes in; called lab and it goes in a blue top container. Pt states she has one that came from the GYN ofc and I informed her only the lab can tell her. Pt states her stools are a little more formed, but she is still having nausea and abdominal cramping. Since the CDIFF won't be back until next week, she asks if she can have something for cramping and nausea; she is leaving for [place] [place] [facility] on Monday. OK to order something for nausea and cramping? Thanks.

## 2012-10-08 NOTE — Telephone Encounter (Signed)
Informed pt Dr Jarold Motto wants her to see a PA; pt will see Doug Sou, PA on 10/11/12.

## 2012-10-08 NOTE — Telephone Encounter (Signed)
Has to see PA

## 2012-10-11 ENCOUNTER — Ambulatory Visit (INDEPENDENT_AMBULATORY_CARE_PROVIDER_SITE_OTHER): Payer: 59 | Admitting: Gastroenterology

## 2012-10-11 ENCOUNTER — Ambulatory Visit: Payer: Self-pay | Admitting: Gynecology

## 2012-10-11 ENCOUNTER — Encounter: Payer: Self-pay | Admitting: Gastroenterology

## 2012-10-11 VITALS — BP 90/60 | HR 76 | Ht 68.5 in | Wt 136.2 lb

## 2012-10-11 DIAGNOSIS — R197 Diarrhea, unspecified: Secondary | ICD-10-CM

## 2012-10-11 DIAGNOSIS — K589 Irritable bowel syndrome without diarrhea: Secondary | ICD-10-CM

## 2012-10-11 DIAGNOSIS — R110 Nausea: Secondary | ICD-10-CM

## 2012-10-11 LAB — CLOSTRIDIUM DIFFICILE BY PCR: Toxigenic C. Difficile by PCR: NOT DETECTED

## 2012-10-11 MED ORDER — DICYCLOMINE HCL 10 MG PO CAPS: 10.0000 mg | ORAL_CAPSULE | Freq: Two times a day (BID) | ORAL | Status: DC

## 2012-10-11 MED ORDER — ONDANSETRON HCL 4 MG PO TABS: 4.0000 mg | ORAL_TABLET | Freq: Three times a day (TID) | ORAL | Status: DC | PRN

## 2012-10-11 NOTE — Progress Notes (Signed)
10/11/2012 [name] [id number] 08-Jun-1945   History of Present Illness:  This is a 67 year old female who is a patient of Dr. Norval Gable.  She has a history of IBS with alternating constipation and diarrhea as well as microscopic colitis, which was treated with a course of Entocort in the past.    She comes in today complaining of diarrhea, cramping abdominal pain, and nausea for the past one week.  [place] and went to an urgent care where she was given Nitofurantoin.  After taking the antibiotic for a couple of days she began to have diarrhea, abdominal cramping, and nausea.  She stopped the antibiotic and has been off of it for a few days now.  Stool was collected for Cdiff at the end of last week, but has not been resulted yet.  She says that the diarrhea is letting up and actually getting hard at times.  No BM yet today.  A lot of abdominal rumbling.  Still with lower abdominal cramping.  Takes Align probiotics every day.  No blood in stools.    Current Medications, Allergies, Past Medical History, Past Surgical History, Family History and Social History were reviewed in [employer] record.   Physical Exam: BP 90/60  Pulse 76  Ht 5' 8.5" (1.74 m)  Wt 136 lb 3.2 oz (61.78 kg)  BMI 20.41 kg/m2 General: Well developed, white female in no acute distress Head: Normocephalic and atraumatic Eyes:  sclerae anicteric, conjunctiva pink  Ears: Normal auditory acuity Lungs: Clear throughout to auscultation Heart: Regular rate and rhythm Abdomen: Soft, non-distended.  BS present, slightly hyperactive.  Abdominal bruit noted.  Diffuse mild TTP but abdomen benign. Musculoskeletal: Symmetrical with no gross deformities  Extremities: No edema  Neurological: Alert oriented x 4, grossly nonfocal Psychological:  Alert and cooperative. Normal mood, flat affect  Assessment and Recommendations: -Diarrhea with abdominal  cramping:  Recent antibiotic use.  Patient has history of microscopic colitis and IBS with alternating constipation and diarrhea.  She does have a Cdiff study pending so we will follow up on those results.  It does not sound like she needs to be placed back on Entocort at this time.  We will treat this as IBS for now.  Will give her Bentyl 10 mg BID for abdominal cramping.  Continue probiotics.  As long as her Cdiff is negative then she is ok to take Imodium prn for diarrhea.  If diarrhea persists or worsens then consider repeat course of Entocort. -Nausea:  ? Related to above symptoms.  Denies NSAID use.

## 2012-10-11 NOTE — Patient Instructions (Addendum)
We have sent the following medications to your pharmacy for you to pick up at your convenience: Bentyl 10 mg-Take 1 tablet by mouth twice daily Zofran-Take 1 tablet by mouth every 8 hours as needed for nausea  Please follow up with Dr Jarold Motto on Tuesday, 10/12/12 @ 3:00 pm  CC: Dr Sheryn Bison, Dr Marga Melnick

## 2012-10-12 ENCOUNTER — Ambulatory Visit: Payer: 59 | Admitting: Gastroenterology

## 2012-10-12 ENCOUNTER — Telehealth: Payer: Self-pay | Admitting: Gastroenterology

## 2012-10-12 NOTE — Telephone Encounter (Signed)
Patient is asking for her results. Please, advise.

## 2012-10-12 NOTE — Telephone Encounter (Signed)
Cdiff study negative.  Continue probiotic and bentyl.  Imodium prn as discussed at her visit yesterday.  Thank you,  Jess

## 2012-10-12 NOTE — Telephone Encounter (Signed)
Left a message for patient to call me. 

## 2012-10-12 NOTE — Telephone Encounter (Signed)
Spoke with patient and gave her results and recommendations. 

## 2012-10-20 ENCOUNTER — Ambulatory Visit (INDEPENDENT_AMBULATORY_CARE_PROVIDER_SITE_OTHER): Payer: 59 | Admitting: Gynecology

## 2012-10-20 ENCOUNTER — Encounter: Payer: Self-pay | Admitting: Gynecology

## 2012-10-20 VITALS — BP 120/82 | Ht 68.5 in | Wt 139.0 lb

## 2012-10-20 DIAGNOSIS — Z79890 Hormone replacement therapy (postmenopausal): Secondary | ICD-10-CM

## 2012-10-20 DIAGNOSIS — N951 Menopausal and female climacteric states: Secondary | ICD-10-CM

## 2012-10-20 DIAGNOSIS — Z780 Asymptomatic menopausal state: Secondary | ICD-10-CM

## 2012-10-20 DIAGNOSIS — M8580 Other specified disorders of bone density and structure, unspecified site: Secondary | ICD-10-CM

## 2012-10-20 DIAGNOSIS — G4700 Insomnia, unspecified: Secondary | ICD-10-CM

## 2012-10-20 DIAGNOSIS — N952 Postmenopausal atrophic vaginitis: Secondary | ICD-10-CM

## 2012-10-20 DIAGNOSIS — Z1159 Encounter for screening for other viral diseases: Secondary | ICD-10-CM

## 2012-10-20 DIAGNOSIS — M899 Disorder of bone, unspecified: Secondary | ICD-10-CM

## 2012-10-20 DIAGNOSIS — N390 Urinary tract infection, site not specified: Secondary | ICD-10-CM

## 2012-10-20 DIAGNOSIS — F32A Depression, unspecified: Secondary | ICD-10-CM

## 2012-10-20 DIAGNOSIS — F329 Major depressive disorder, single episode, unspecified: Secondary | ICD-10-CM

## 2012-10-20 MED ORDER — TRAZODONE HCL 150 MG PO TABS: 150.0000 mg | ORAL_TABLET | Freq: Every day | ORAL | Status: DC

## 2012-10-20 MED ORDER — ESTRADIOL 0.1 MG/GM VA CREA: TOPICAL_CREAM | VAGINAL | Status: DC

## 2012-10-20 NOTE — Progress Notes (Signed)
[name] [address] Dec 15, 1945 [id number]   History:    67 y.o.  Patient to the practice. Patient was previously followed by Dr. Nicholas Lose. Review of patient's notes indicated that she has a history in 2009 of a vaginal hysterectomy with anterior and posterior repair as well as enterocele repair and colposuspension. She has done well and is taking estrogen vaginal cream twice a week although at times she still feels dry and discomfort during intercourse. Patient's primary physician is Dr. Alwyn Ren who has been doing her lab work. She has hyperlipidemia and is currently on Lipitor. She takes trazodone 150 mg before bedtime for insomnia she also has depression for which she had been on Zoloft 50 mg daily. [place] She denied any dysuria or frequency today. Review of her records indicated she had a bone density study July 2012 with the lowest T. Score at the AP spine with a value -2.2. She is taking calcium vitamin D only. Patient denies any prior history of abnormal Pap smears. Her mammogram was normal in 2013 as was her colonoscopy in 2012. Her vaccines are up-to-date.  Past medical history,surgical history, family history and social history were all reviewed and documented in the EPIC chart.  Gynecologic History No LMP recorded. Patient has had a hysterectomy. Contraception: status post hysterectomy Last Pap: 2013. Results were: normal Last mammogram: 2013. Results were: normal  Obstetric History OB History  Gravida Para Term Preterm AB SAB TAB Ectopic Multiple Living  2 2        2     # Outcome Date GA Lbr Len/2nd Weight Sex Delivery Anes PTL Lv  2 PAR           1 PAR                ROS: A ROS was performed and pertinent positives and negatives are included in the history.  GENERAL: No fevers or chills. HEENT: No change in vision, no earache, sore throat or sinus congestion. NECK: No pain or stiffness. CARDIOVASCULAR: No chest  pain or pressure. No palpitations. PULMONARY: No shortness of breath, cough or wheeze. GASTROINTESTINAL: No abdominal pain, nausea, vomiting or diarrhea, melena or bright red blood per rectum. GENITOURINARY: No urinary frequency, urgency, hesitancy or dysuria. MUSCULOSKELETAL: No joint or muscle pain, no back pain, no recent trauma. DERMATOLOGIC: No rash, no itching, no lesions. ENDOCRINE: No polyuria, polydipsia, no heat or cold intolerance. No recent change in weight. HEMATOLOGICAL: No anemia or easy bruising or bleeding. NEUROLOGIC: No headache, seizures, numbness, tingling or weakness. PSYCHIATRIC: No depression, no loss of interest in normal activity or change in sleep pattern.     Exam: chaperone present  BP 120/82  Ht 5' 8.5" (1.74 m)  Wt 139 lb (63.05 kg)  BMI 20.83 kg/m2  Body mass index is 20.83 kg/(m^2).  General appearance : Well developed well nourished female. No acute distress HEENT: Neck supple, trachea midline, no carotid bruits, no thyroidmegaly Lungs: Clear to auscultation, no rhonchi or wheezes, or rib retractions  Heart: Regular rate and rhythm, no murmurs or gallops Breast:Examined in sitting and supine position were symmetrical in appearance, no palpable masses or tenderness,  no skin retraction, no nipple inversion, no nipple discharge, no skin discoloration, no axillary or supraclavicular lymphadenopathy Abdomen: no palpable masses or tenderness, no rebound or guarding Extremities: no edema or skin discoloration or tenderness  Pelvic:  Bartholin, Urethra, Skene Glands: Within normal limits  Vagina: No gross lesions or discharge  Cervix: absent  Uterus Absent  Adnexa  Without masses or tenderness  Anus and perineum  normal   Rectovaginal  normal sphincter tone without palpated masses or tenderness             Hemoccult course provided     Assessment/Plan:  67 y.o. female new to the practice. We discussed the concerns that I had for her taking such a  high dose of trazodone 150 mg at bedtime and Zoloft 50 mg daily. She is going to taper off the Zoloft. Labs done by her primary physician. She was provided with Hemoccult card cemented to the office today. Urinalysis was obtained. No Pap smear done today the new guidelines were discussed.  New CDC guidelines is recommending patients be tested once in her lifetime for hepatitis C antibody who were born between [birth year] through 1967. This was discussed with the patient today and has agreed to be tested today.  Patient was reminded to schedule her mammogram and to submit to the office the Hemoccult cards for testing. She will schedule a bone density study here in our office in the next 2 weeks. For her dyspareunia she will increase her vaginal estrogen from twice a week to 3 times a week. Patient fully aware of the risks benefits and pros and cons of estrogen.    Ok Edwards MD, 11:07 AM 10/20/2012

## 2012-10-20 NOTE — Patient Instructions (Signed)
Bone Densitometry Bone densitometry is a special X-ray that measures your bone density and can be used to help predict your risk of bone fractures. This test is used to determine bone mineral content and density to diagnose osteoporosis. Osteoporosis is the loss of bone that may cause the bone to become weak. Osteoporosis commonly occurs in women entering menopause. However, it may be found in men and in people with other diseases. PREPARATION FOR TEST No preparation necessary. WHO SHOULD BE TESTED?  All women older than 65.  Postmenopausal women (50 to 65) with risk factors for osteoporosis.  People with a previous fracture caused by normal activities.  People with a small body frame (less than 127 poundsor a body mass index [BMI] of less than 21).  People who have a parent with a hip fracture or history of osteoporosis.  People who smoke.  People who have rheumatoid arthritis.  Anyone who engages in excessive alcohol use (more than 3 drinks most days).  Women who experience early menopause. WHEN SHOULD YOU BE RETESTED? Current guidelines suggest that you should wait at least 2 years before doing a bone density test again if your first test was normal.Recent studies indicated that women with normal bone density may be able to wait a few years before needing to repeat a bone density test. You should discuss this with your caregiver.  NORMAL FINDINGS   Normal: less than standard deviation below normal (greater than -1).  Osteopenia: 1 to 2.5 standard deviations below normal (-1 to -2.5).  Osteoporosis: greater than 2.5 standard deviations below normal (less than -2.5). Test results are reported as a "T score" and a "Z score."The T score is a number that compares your bone density with the bone density of healthy, young women.The Z score is a number that compares your bone density with the scores of women who are the same age, gender, and race.  Ranges for normal findings may vary  among different laboratories and hospitals. You should always check with your doctor after having lab work or other tests done to discuss the meaning of your test results and whether your values are considered within normal limits. MEANING OF TEST  Your caregiver will go over the test results with you and discuss the importance and meaning of your results, as well as treatment options and the need for additional tests if necessary. OBTAINING THE TEST RESULTS It is your responsibility to obtain your test results. Ask the lab or department performing the test when and how you will get your results. Document Released: 03/11/2004 Document Revised: 05/12/2011 Document Reviewed: 04/03/2010 Exit[employer] Patient Information 2014 [employer].  

## 2012-10-21 LAB — URINALYSIS W MICROSCOPIC + REFLEX CULTURE
Bacteria, UA: NONE SEEN
Casts: NONE SEEN
Glucose, UA: NEGATIVE mg/dL
Hgb urine dipstick: NEGATIVE
Ketones, ur: NEGATIVE mg/dL
Nitrite: NEGATIVE
pH: 5.5 (ref 5.0–8.0)

## 2012-10-21 LAB — HEPATITIS C ANTIBODY: HCV Ab: NEGATIVE

## 2012-10-22 ENCOUNTER — Telehealth: Payer: Self-pay | Admitting: *Deleted

## 2012-10-22 NOTE — Telephone Encounter (Signed)
Pt informed with normal urine results on 10/20/12.

## 2012-11-03 ENCOUNTER — Encounter: Payer: Self-pay | Admitting: Gynecology

## 2012-11-10 ENCOUNTER — Ambulatory Visit (INDEPENDENT_AMBULATORY_CARE_PROVIDER_SITE_OTHER): Payer: 59 | Admitting: Internal Medicine

## 2012-11-10 ENCOUNTER — Encounter: Payer: Self-pay | Admitting: Internal Medicine

## 2012-11-10 VITALS — BP 115/71 | HR 65 | Temp 97.9°F | Ht 68.5 in | Wt 136.4 lb

## 2012-11-10 DIAGNOSIS — Z0000 Encounter for general adult medical examination without abnormal findings: Secondary | ICD-10-CM

## 2012-11-10 DIAGNOSIS — M899 Disorder of bone, unspecified: Secondary | ICD-10-CM

## 2012-11-10 DIAGNOSIS — G609 Hereditary and idiopathic neuropathy, unspecified: Secondary | ICD-10-CM

## 2012-11-10 DIAGNOSIS — E785 Hyperlipidemia, unspecified: Secondary | ICD-10-CM

## 2012-11-10 DIAGNOSIS — Z1331 Encounter for screening for depression: Secondary | ICD-10-CM

## 2012-11-10 MED ORDER — ATORVASTATIN CALCIUM 20 MG PO TABS: 20.0000 mg | ORAL_TABLET | Freq: Every day | ORAL | Status: DC

## 2012-11-10 NOTE — Progress Notes (Signed)
  Subjective:    Patient ID: [name], female    DOB: Sep 27, 1945, 67 y.o.   MRN: [id number]  HPI  She is here for a physical;acute issues include extrinsic rhinoconjunctivitis symptoms for which she seen Dr. Annalee Genta. Symptoms are mainly rhinitis, hoarseness, and cough. She is on fluticasone.     Review of Systems She is on a heart healthy diet; she is not exercising .She denies chest pain,  dyspnea, or claudication. She has intermittent palpitations &  tachycardia which can be non exertional Family history is positive for premature coronary disease in her father. Advanced cholesterol testing reveals her LDL goal is less than 145.There is medication compliance with the statin. She is not having significant myalgias.  She does have peripheral neuropathy; she has not seen her neurologist for 3 years.     Objective:   Physical Exam  Gen.: Thin but healthy and well-nourished in appearance. Alert, appropriate and cooperative throughout exam.Appears younger than stated age  Head: Normocephalic without obvious abnormalities  Eyes: No corneal or conjunctival inflammation noted. Pupils equal round reactive to light and accommodation.  Extraocular motion intact. Vision grossly decreased OD Ears: External  ear exam reveals no significant lesions or deformities. Canals clear .TMs normal. Hearing is grossly decreased on L. Nose: External nasal exam reveals no deformity or inflammation. Nasal mucosa are pink and moist. No lesions or exudates noted.   Mouth: Oral mucosa and oropharynx reveal no lesions or exudates. Teeth in good repair. Neck: No deformities, masses, or tenderness noted. Range of motion decreased. Thyroid slightly irregular; small. Lungs: Normal respiratory effort; chest expands symmetrically. Lungs are clear to auscultation without rales, wheezes, or increased work of breathing. Heart: Normal rate and rhythm. Normal S1 and S2. No gallop, click, or rub. S4 w/o  murmur. Abdomen: Bowel sounds normal; abdomen soft and nontender. No masses, organomegaly or hernias noted. Genitalia: As per Gyn                                  Musculoskeletal/extremities: No deformity or scoliosis noted of  the thoracic or lumbar spine.  No clubbing, cyanosis, edema, or significant extremity  deformity noted. Range of motion normal .Tone & strength  Normal. Joints normal. Nail health good. Able to lie down & sit up w/o help. Negative SLR bilaterally Vascular: Carotid, radial artery, dorsalis pedis and  posterior tibial pulses are full and equal. No bruits present. Neurologic: Alert and oriented x3. Deep tendon reflexes symmetrical and normal.  Light touch decreased over feet.        Skin: Intact without suspicious lesions or rashes. Tanned Lymph: No cervical, axillary lymphadenopathy present. Psych: Mood and affect are normal. Normally interactive                                                                                        Assessment & Plan:  #1 comprehensive physical exam; no acute findings  #2 peripheral neuropathy; reevaluation requested Plan: see Orders  & Recommendations

## 2012-11-10 NOTE — Patient Instructions (Addendum)
Based on your cholesterol panel; I may recommend that the atorvastatin be decreased to 20 mg daily.Cardiovascular exercise, this can be as simple a program as walking, is recommended 30-45 minutes 3-4 times per week. If you're not exercising you should take 6-8 weeks to build up to this level. Plain Mucinex (NOT D) for thick secretions ;force NON dairy fluids .   Nasal cleansing in the shower as discussed with lather of mild shampoo.After 10 seconds wash off lather while  exhaling through nostrils. Make sure that all residual soap is removed to prevent irritation.  Fluticasone 1 spray in each nostril twice a day as needed. Use the "crossover" technique into opposite nostril spraying toward opposite ear @ 45 degree angle, not straight up into nostril.  Use a Neti pot daily only  as needed for significant sinus congestion; going from open side to congested side . Plain Allegra (NOT D )  160 daily , Loratidine 10 mg , OR Zyrtec 10 mg @ bedtime  as needed for itchy eyes & sneezing.

## 2012-11-11 LAB — HEPATIC FUNCTION PANEL
ALT: 19 U/L (ref 0–35)
Alkaline Phosphatase: 68 U/L (ref 39–117)
Bilirubin, Direct: 0.1 mg/dL (ref 0.0–0.3)
Total Bilirubin: 0.7 mg/dL (ref 0.3–1.2)
Total Protein: 7.1 g/dL (ref 6.0–8.3)

## 2012-11-11 LAB — CBC WITH DIFFERENTIAL/PLATELET
Basophils Relative: 0.4 % (ref 0.0–3.0)
Eosinophils Relative: 1.1 % (ref 0.0–5.0)
Lymphocytes Relative: 29.1 % (ref 12.0–46.0)
Neutrophils Relative %: 64 % (ref 43.0–77.0)
RBC: 4.05 Mil/uL (ref 3.87–5.11)
WBC: 6.6 10*3/uL (ref 4.5–10.5)

## 2012-11-11 LAB — BASIC METABOLIC PANEL
Calcium: 9.5 mg/dL (ref 8.4–10.5)
Creatinine, Ser: 0.5 mg/dL (ref 0.4–1.2)
GFR: 121.66 mL/min (ref >60.00)

## 2012-11-11 LAB — LIPID PANEL
HDL: 58 mg/dL (ref >39.00)
LDL Cholesterol: 76 mg/dL (ref 0–99)
Total CHOL/HDL Ratio: 3

## 2012-11-11 LAB — TSH: TSH: 1.77 u[IU]/mL (ref 0.35–5.50)

## 2012-11-12 ENCOUNTER — Ambulatory Visit (INDEPENDENT_AMBULATORY_CARE_PROVIDER_SITE_OTHER): Payer: 59 | Admitting: Gastroenterology

## 2012-11-12 ENCOUNTER — Encounter: Payer: Self-pay | Admitting: Gastroenterology

## 2012-11-12 VITALS — BP 100/60 | HR 66 | Ht 68.5 in | Wt 137.1 lb

## 2012-11-12 DIAGNOSIS — R197 Diarrhea, unspecified: Secondary | ICD-10-CM

## 2012-11-12 DIAGNOSIS — R1032 Left lower quadrant pain: Secondary | ICD-10-CM

## 2012-11-12 DIAGNOSIS — K589 Irritable bowel syndrome without diarrhea: Secondary | ICD-10-CM

## 2012-11-12 DIAGNOSIS — K219 Gastro-esophageal reflux disease without esophagitis: Secondary | ICD-10-CM

## 2012-11-12 DIAGNOSIS — F458 Other somatoform disorders: Secondary | ICD-10-CM

## 2012-11-12 DIAGNOSIS — R0989 Other specified symptoms and signs involving the circulatory and respiratory systems: Secondary | ICD-10-CM

## 2012-11-12 DIAGNOSIS — G8929 Other chronic pain: Secondary | ICD-10-CM

## 2012-11-12 MED ORDER — DICYCLOMINE HCL 10 MG PO CAPS: 10.0000 mg | ORAL_CAPSULE | Freq: Three times a day (TID) | ORAL | Status: DC

## 2012-11-12 MED ORDER — SUCRALFATE 1 GM/10ML PO SUSP: ORAL | Status: DC

## 2012-11-12 NOTE — Progress Notes (Signed)
This is a 67 year old Caucasian female with rather classic irritable bowel syndrome, diarrhea predominant but also alternating constipation.  She has crampy lower abdominal pain with gas and bloating as had numerous negative GI evaluations.  She continues to have periodic spells of IBS, and is on probiotics.  She also has chronic GERD and is on Nexium 40 mg twice a day, and continues with a globus sensation in her throat, and apparently her ENT physician has referred her to an allergist.  She denies true reflux symptoms currently with dysphagia, melena, hematochezia, gynecologic or general medical or systemic complaints.  Her appetite is good her weight is stable and she denies use of sorbitol or fructose.  There is been no evidence previously of celiac disease or other evidence of malabsorption.  Current Medications, Allergies, Past Medical History, Past Surgical History, Family History and Social History were reviewed in [employer] record.  ROS: All systems were reviewed and are negative unless otherwise stated in the HPI.          Physical Exam: Healthy-appearing patient in no distress appearing younger than her stated age.  Her pressure 100/60, pulse 66 and regular and weight 137 with a BMI of 20.54.  Her abdomen is not distended, there is no organomegaly, masses, or true tenderness.  Bowel sounds are normal.  Mental status is normal.  Rectal exam is deferred.    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  I placed her on a FOD-MAP diet for IBS diarrhea, Benefiber 1 tablespoon twice a day, when necessary Benadryl 10 mg every 6-8 hours, continue probiotic therapy, and have given her some Carafate suspension at her request for acid reflux.  She may need a high-resolution esophageal manometry.  I reviewed a reflux regime with her, also IBS, and answer multiple complaints.  She has a long  history of depression and anxiety and is on Desyrel real 150 mg a day.  She has not need endoscopy or colonoscopy at this time.

## 2012-11-12 NOTE — Patient Instructions (Addendum)
Please purchase Benefiber over the counter and take one tablespoon twice daily   We have sent the following medications to your pharmacy for you to pick up at your convenience: Bentyl, please take as directed   Carafate, please take as directed   FODMAP Diet given today for your review  Please follow up in one year with Dr. Jarold Motto or sooner if symptoms reoccur

## 2012-11-13 LAB — VITAMIN D 1,25 DIHYDROXY
Vitamin D2 1, 25 (OH)2: <8 pg/mL
Vitamin D3 1, 25 (OH)2: 82 pg/mL

## 2012-11-18 ENCOUNTER — Telehealth: Payer: Self-pay | Admitting: *Deleted

## 2012-11-18 NOTE — Telephone Encounter (Signed)
Pt called requesting order faxed to [facility] for bil diag. Mammogram. Order signed and faxed.

## 2012-11-25 ENCOUNTER — Encounter: Payer: Self-pay | Admitting: Gynecology

## 2012-11-26 ENCOUNTER — Telehealth: Payer: Self-pay | Admitting: Gastroenterology

## 2012-11-26 MED ORDER — ESOMEPRAZOLE MAGNESIUM 40 MG PO CPDR: 40.0000 mg | DELAYED_RELEASE_CAPSULE | Freq: Two times a day (BID) | ORAL | Status: DC

## 2012-11-26 NOTE — Telephone Encounter (Signed)
RX sent

## 2012-11-30 ENCOUNTER — Ambulatory Visit (INDEPENDENT_AMBULATORY_CARE_PROVIDER_SITE_OTHER): Payer: 59 | Admitting: Neurology

## 2012-11-30 ENCOUNTER — Encounter: Payer: Self-pay | Admitting: Neurology

## 2012-11-30 VITALS — BP 96/69 | HR 74 | Ht 68.5 in | Wt 135.5 lb

## 2012-11-30 DIAGNOSIS — G609 Hereditary and idiopathic neuropathy, unspecified: Secondary | ICD-10-CM

## 2012-11-30 MED ORDER — PREGABALIN 50 MG PO CAPS: ORAL_CAPSULE | ORAL | Status: DC

## 2012-11-30 NOTE — Progress Notes (Signed)
Reason for visit: Peripheral neuropathy  [name] is a 67 y.o. female  History of present illness:  [name] is a 67 year old right-handed white female with a history of a peripheral neuropathy that was initially diagnosed in 2006 following a nerve conduction study that showed evidence of a mild peripheral neuropathy. Blood work was done around that time that was unremarkable. The patient in the past as been treated with gabapentin, but she indicated that she could not tolerate this medication. The peripheral neuropathy pain continues to worsen over time, and currently she is having symptoms in the feet and legs, up to the knees. The patient may have tingling, stinging, and burning pain. The patient denies any severe gait problems, but she has mild gait instability. The patient denies any falls. The patient does report some back and neck discomfort. The patient also has some tingling in fingers. The patient indicates that her symptoms are somewhat worse at night. The patient is not sleeping well. The patient does report some urinary urgency, and a history of colitis. The patient is sent to this office for an evaluation.  Past Medical History  Diagnosis Date  . Diverticulosis   . Gastroesophageal reflux disease with hiatal hernia   . IBS (irritable bowel syndrome)   . Hyperlipidemia   . Spondylosis   . Migraine headache   . Schatzki's ring     History of  . Sleep apnea     On CPAP  . Osteopenia     BMD ordered by GYN  . Mitral valve prolapse   . Depression     generalized anxiety disorder  . Nephrolithiasis 2002  . Hiatal hernia   . Fibromyalgia   . Peripheral neuropathy     treated as RLS by  Neurology  . Lymphocytic colitis     Dr Jarold Motto  . Duodenal diverticulum   . Anxiety   . Restless leg syndrome     Past Surgical History  Procedure Laterality Date  . Tubal ligation    . Cholecystectomy    . Vaginal cystectomy      x 2  . Vaginal  hysterectomy  05/2007    Vaginal repair, Dr Nicholas Lose  . Rotator cuff repair Left     left  . Sinus surgery with instatrak      x 2  . Esophageal dilation      X 2  . Breast enhancement surgery    . Cataract surgery Left     Dr Hazle Quant    Family History  Problem Relation Age of Onset  . Hypertension Father 26  . Stroke Father 32  . Heart attack Father      ? in 49s  . Hypertension Mother   . Neuropathy Mother   . Anemia Mother   . Coronary artery disease Brother     Stent placement in 60s  . Colon cancer Neg Hx   . Seizures Neg Hx   . Diabetes Maternal Uncle   . Heart attack Paternal Uncle     > 55    Social history:  reports that she quit smoking about 8 years ago. Her smoking use included Cigarettes. She has a 7.5 pack-year smoking history. She has never used smokeless tobacco. She reports that she does not drink alcohol or use illicit drugs.  Medications:  Current Outpatient Prescriptions on File Prior to Visit  Medication Sig Dispense Refill  . atorvastatin (LIPITOR) 20 MG tablet Take 1 tablet (20 mg total) by  mouth daily.  90 tablet  3  . b complex vitamins capsule Take 1 capsule by mouth daily.      . Biotin 1000 MCG tablet Take 1,000 mcg by mouth daily.        [name] esomeprazole (NEXIUM) 40 MG capsule Take 1 capsule (40 mg total) by mouth 2 (two) times daily.  60 capsule  11  . estradiol (ESTRACE) 0.1 MG/GM vaginal cream Apply twice a week  42.5 g  10  . fluticasone (FLONASE) 50 MCG/ACT nasal spray Place 2 sprays into the nose daily.      . Magnesium Oxide 250 MG TABS Take 250 mg by mouth daily.       . midodrine (PROAMATINE) 10 MG tablet Take 1 tablet (10 mg total) by mouth 3 (three) times daily.  90 tablet  6  . Probiotic Product (ALIGN) 4 MG CAPS Take by mouth daily.       . sucralfate (CARAFATE) 1 GM/10ML suspension Take one teaspoon after meals and at bedtime  420 mL  1  . traZODone (DESYREL) 150 MG tablet Take 1 tablet (150 mg total) by mouth at bedtime. Take 1-2  tablets by mouth at bedtime  30 tablet  5  . vitamin C (ASCORBIC ACID) 500 MG tablet Take 500 mg by mouth daily.      . [DISCONTINUED] colesevelam (WELCHOL) 625 MG tablet Take 625 mg by mouth 2 (two) times daily with a meal. Take one tablet by mouth twice a day      . [DISCONTINUED] Hyoscyamine-Phenyltoloxamine (DIGEX NF) [id number] MG CAPS Take one tablet by mouth as needed for abdominal cramping  24 each  0   No current facility-administered medications on file prior to visit.      Allergies  Allergen Reactions  . Adhesive [Tape]     rash  . Nitrofuran Derivatives     "tingling"    ROS:  Out of a complete 14 system review of symptoms, the patient complains only of the following symptoms, and all other reviewed systems are negative.  Palpitations Cough, snoring Diarrhea, constipation Urinary urgency Easy bruising Feeling hot, cold Joint pain Numbness, dizziness Restless legs  Blood pressure 96/69, pulse 74, height 5' 8.5" (1.74 m), weight 135 lb 8 oz (61.462 kg).  Physical Exam  General: The patient is alert and cooperative at the time of the examination.  Head: Pupils are equal, round, and reactive to light. Discs are flat bilaterally.  Neck: The neck is supple, no carotid bruits are noted.  Respiratory: The respiratory examination is clear.  Cardiovascular: The cardiovascular examination reveals a regular rate and rhythm, no obvious murmurs or rubs are noted.  Skin: Extremities are without significant edema.  Neurologic Exam  Mental status:  Cranial nerves: Facial symmetry is present. There is good sensation of the face to pinprick and soft touch bilaterally. The strength of the facial muscles and the muscles to head turning and shoulder shrug are normal bilaterally. Speech is well enunciated, no aphasia or dysarthria is noted. Extraocular movements are full. Visual fields are full.  Motor: The motor testing reveals 5 over 5 strength of all 4 extremities. Good  symmetric motor tone is noted throughout.  Sensory: Sensory testing is intact to pinprick, soft touch, vibration sensation, and position sense on all 4 extremities, with the exception that there is a stocking pattern pinprick sensory deficit up to the knees bilaterally. No evidence of extinction is noted.  Coordination: Cerebellar testing reveals good finger-nose-finger and heel-to-shin bilaterally.  Gait and station: Gait is normal. Tandem gait is normal. Romberg is negative. No drift is seen.  Reflexes: Deep tendon reflexes are symmetric and normal bilaterally, with exception of depression and ankle jerk reflexes bilaterally. Toes are downgoing bilaterally.   Assessment/Plan:  One. History of peripheral neuropathy  The patient is having ongoing discomfort involving the legs primarily. The patient will be sent for further blood work today, and she will be placed on Lyrica for the discomfort. The patient will followup through this office in about 4 months.  Marlan Palau MD 11/30/2012 5:18 PM  [facility] [address] [place], [place] [address]  Phone [phone] Fax [phone]

## 2012-11-30 NOTE — Patient Instructions (Signed)
Reason for visit: Peripheral neuropathy  [name] is a 67 y.o. female  History of present illness:  [name] is a 67 year old right-handed white female with a history of a peripheral neuropathy. The patient has initially had an evaluation in 2006, and I nerve conduction study at that time showed evidence of a mild neuropathy. The patient had blood work that was relatively unremarkable. The patient returns with increasing discomfort in the feet and legs over time, with some numbness as well in the fingers. The patient reports some mild balance changes, no recent falls. The patient denies problems controlling the bowels or the bladder. The patient does have some neck and low back pain. The patient has burning and stinging pain in the feet. The patient does have some urinary urgency at times. The patient has been on gabapentin in the past, but she could not tolerate this. The patient is sent to this office for further evaluation. The patient indicates that her mother also had a peripheral neuropathy.  Past Medical History  Diagnosis Date  . Diverticulosis   . Gastroesophageal reflux disease with hiatal hernia   . IBS (irritable bowel syndrome)   . Hyperlipidemia   . Spondylosis   . Migraine headache   . Schatzki's ring     History of  . Sleep apnea   . Osteopenia     BMD ordered by GYN  . Mitral valve prolapse   . Depression     generalized anxiety disorder  . Nephrolithiasis 2002  . Hiatal hernia   . Fibromyalgia   . Peripheral neuropathy     treated as RLS by  Neurology  . Lymphocytic colitis     Dr Jarold Motto  . Duodenal diverticulum   . Anxiety     Past Surgical History  Procedure Laterality Date  . Tubal ligation    . Cholecystectomy    . Vaginal cystectomy      x 2  . Vaginal hysterectomy  05/2007    Vaginal repair, Dr Nicholas Lose  . Rotator cuff repair Left     left  . Sinus surgery with instatrak      x 2  . Esophageal dilation      X 2  .  Breast enhancement surgery    . Cataract surgery Left     Dr Hazle Quant    Family History  Problem Relation Age of Onset  . Hypertension Father 8  . Stroke Father 50  . Heart attack Father      ? in 52s  . Hypertension Mother   . Neuropathy Mother   . Anemia Mother   . Coronary artery disease Brother     Stent placement in 60s  . Colon cancer Neg Hx   . Seizures Neg Hx   . Diabetes Maternal Uncle   . Heart attack Paternal Uncle     > 55    Social history:  reports that she quit smoking about 8 years ago. Her smoking use included Cigarettes. She has a 7.5 pack-year smoking history. She has never used smokeless tobacco. She reports that she does not drink alcohol or use illicit drugs.  Medications:  Current Outpatient Prescriptions on File Prior to Visit  Medication Sig Dispense Refill  . atorvastatin (LIPITOR) 20 MG tablet Take 1 tablet (20 mg total) by mouth daily.  90 tablet  3  . b complex vitamins capsule Take 1 capsule by mouth daily.      . Biotin 1000 MCG tablet  Take 1,000 mcg by mouth daily.        . Chlorpheniramine Maleate (ALLERGY RELIEF PO) Take by mouth. As needed.      . dicyclomine (BENTYL) 10 MG capsule Take 1 capsule (10 mg total) by mouth 4 (four) times daily -  before meals and at bedtime.  90 capsule  2  . esomeprazole (NEXIUM) 40 MG capsule Take 1 capsule (40 mg total) by mouth 2 (two) times daily.  60 capsule  11  . estradiol (ESTRACE) 0.1 MG/GM vaginal cream Apply twice a week  42.5 g  10  . fluticasone (FLONASE) 50 MCG/ACT nasal spray Place 2 sprays into the nose daily.      . Magnesium Oxide 250 MG TABS Take 250 mg by mouth daily.       . midodrine (PROAMATINE) 10 MG tablet Take 1 tablet (10 mg total) by mouth 3 (three) times daily.  90 tablet  6  . Probiotic Product (ALIGN) 4 MG CAPS Take by mouth daily.       . sucralfate (CARAFATE) 1 GM/10ML suspension Take one teaspoon after meals and at bedtime  420 mL  1  . traZODone (DESYREL) 150 MG tablet Take 1  tablet (150 mg total) by mouth at bedtime. Take 1-2 tablets by mouth at bedtime  30 tablet  5  . vitamin C (ASCORBIC ACID) 500 MG tablet Take 500 mg by mouth daily.      . [DISCONTINUED] colesevelam (WELCHOL) 625 MG tablet Take 625 mg by mouth 2 (two) times daily with a meal. Take one tablet by mouth twice a day      . [DISCONTINUED] Hyoscyamine-Phenyltoloxamine (DIGEX NF) [id number] MG CAPS Take one tablet by mouth as needed for abdominal cramping  24 each  0   No current facility-administered medications on file prior to visit.      Allergies  Allergen Reactions  . Adhesive [Tape]     rash  . Nitrofuran Derivatives     "tingling"    ROS:  Out of a complete 14 system review of symptoms, the patient complains only of the following symptoms, and all other reviewed systems are negative.  Palpitations of the heart Cough, snoring Diarrhea, constipation Urinary urgency Easy bruising Feeling hot, cold Joint pain Numbness, dizziness Restless legs  Blood pressure 96/69, pulse 74, height 5' 8.5" (1.74 m), weight 135 lb 8 oz (61.462 kg).  Physical Exam  General: The patient is alert and cooperative at the time of the examination.  Head: Pupils are equal, round, and reactive to light. Discs are flat bilaterally.  Neck: The neck is supple, no carotid bruits are noted.  Respiratory: The respiratory examination is clear.  Cardiovascular: The cardiovascular examination reveals a regular rate and rhythm, no obvious murmurs or rubs are noted.  Skin: Extremities are without significant edema.  Neurologic Exam  Mental status:  Cranial nerves: Facial symmetry is present. There is good sensation of the face to pinprick and soft touch bilaterally. The strength of the facial muscles and the muscles to head turning and shoulder shrug are normal bilaterally. Speech is well enunciated, no aphasia or dysarthria is noted. Extraocular movements are full. Visual fields are full.  Motor: The  motor testing reveals 5 over 5 strength of all 4 extremities. Good symmetric motor tone is noted throughout.  Sensory: Sensory testing is intact to pinprick, soft touch, vibration sensation, and position sense on all 4 extremities, with exception of a stocking pattern pinprick sensory deficit up to the knees  bilaterally. No evidence of extinction is noted.  Coordination: Cerebellar testing reveals good finger-nose-finger and heel-to-shin bilaterally.  Gait and station: Gait is normal. Tandem gait is normal. Romberg is negative. No drift is seen.  Reflexes: Deep tendon reflexes are symmetric and normal bilaterally, with the exception that the ankle jerk reflexes are depressed bilaterally. Toes are downgoing bilaterally.   Assessment/Plan:  1. Peripheral neuropathy  2. Restless leg syndrome  The patient will be sent for further blood work, and she will be given a trial on Lyrica at this time. The patient will followup in about 4 months. The patient could potentially benefit from a topical agent for her neuropathy in the future.  Marlan Palau MD 11/30/2012 3:25 PM  [facility] [address] [place], [place] [address]  Phone [phone] Fax [phone]

## 2012-12-02 LAB — ANA W/REFLEX: Anti Nuclear Antibody(ANA): NEGATIVE

## 2012-12-02 LAB — IFE AND PE, SERUM
Albumin SerPl Elph-Mcnc: 4 g/dL (ref 3.2–5.6)
Alpha 1: 0.3 g/dL (ref 0.1–0.4)
Alpha2 Glob SerPl Elph-Mcnc: 0.6 g/dL (ref 0.4–1.2)
Gamma Glob SerPl Elph-Mcnc: 0.7 g/dL (ref 0.5–1.6)
Globulin, Total: 2.4 g/dL (ref 2.0–4.5)
IgA/Immunoglobulin A, Serum: 89 mg/dL — ABNORMAL LOW (ref 91–414)
IgM (Immunoglobulin M), Srm: 147 mg/dL (ref 40–230)

## 2012-12-02 LAB — RHEUMATOID FACTOR: Rhuematoid fact SerPl-aCnc: 8.7 IU/mL (ref 0.0–13.9)

## 2012-12-06 NOTE — Progress Notes (Signed)
Quick Note:  I called pt and relayed the results of labs. She stated that she would be having a MRI with Dr. Venetia Maxon , upcoming. ______

## 2012-12-14 ENCOUNTER — Telehealth: Payer: Self-pay | Admitting: Neurology

## 2012-12-14 MED ORDER — CARBAMAZEPINE 100 MG PO CHEW: CHEWABLE_TABLET | ORAL | Status: DC

## 2012-12-14 NOTE — Telephone Encounter (Signed)
I called patient. The patient is white doubt by the Lyrica. She will stop the medication. The patient indicates that she was on Cymbalta previously, she does not remember the dose. The patient tolerated the medication but it did not help her neuropathy discomfort. I'll try low-dose carbamazepine.

## 2012-12-17 ENCOUNTER — Telehealth: Payer: Self-pay | Admitting: Neurology

## 2012-12-17 NOTE — Telephone Encounter (Signed)
Patient states since stopping the four medications recently, she is still feeling "woozy", dizzy, and jittery. She has not started the low dose of carbamazepine Dr. Anne Hahn mentioned in his 12/14/12 telephone note. She says she is afraid to and would like to talk w/ Dr. Anne Hahn directly about how long it takes for the other meds to clear her system and if it is safe to start the carbamazepine.

## 2012-12-17 NOTE — Telephone Encounter (Signed)
The patient indicates that she still feels drugged from the Lyrica. The Lyrica should have cleared her system by now. The patient will wait until she feels normal before she starts the carbamazepine.

## 2012-12-29 ENCOUNTER — Other Ambulatory Visit (HOSPITAL_COMMUNITY): Payer: Self-pay | Admitting: Oral and Maxillofacial Surgery

## 2012-12-29 DIAGNOSIS — R6884 Jaw pain: Secondary | ICD-10-CM

## 2013-01-06 ENCOUNTER — Ambulatory Visit (HOSPITAL_COMMUNITY)
Admission: RE | Admit: 2013-01-06 | Discharge: 2013-01-06 | Disposition: A | Discharge status: Home / Self Care | Payer: 59 | Source: Ambulatory Visit | Attending: Oral and Maxillofacial Surgery | Admitting: Oral and Maxillofacial Surgery

## 2013-01-06 ENCOUNTER — Other Ambulatory Visit: Payer: Self-pay

## 2013-01-06 DIAGNOSIS — M26629 Arthralgia of temporomandibular joint, unspecified side: Secondary | ICD-10-CM | POA: Insufficient documentation

## 2013-01-06 DIAGNOSIS — R6884 Jaw pain: Secondary | ICD-10-CM

## 2013-01-18 ENCOUNTER — Other Ambulatory Visit: Payer: Self-pay | Admitting: Gynecology

## 2013-01-18 ENCOUNTER — Ambulatory Visit (INDEPENDENT_AMBULATORY_CARE_PROVIDER_SITE_OTHER): Payer: 59

## 2013-01-18 DIAGNOSIS — M8580 Other specified disorders of bone density and structure, unspecified site: Secondary | ICD-10-CM

## 2013-01-18 DIAGNOSIS — M899 Disorder of bone, unspecified: Secondary | ICD-10-CM

## 2013-01-18 DIAGNOSIS — Z780 Asymptomatic menopausal state: Secondary | ICD-10-CM

## 2013-02-10 IMAGING — CT CT ABD-PELV W/ CM
2 of 5 series · 16 of 46 positions shown, 18 images · IV contrast (APPLIED)
Comparison: None.

CLINICAL DATA: Abdominal pain, diarrhea

CT ABDOMEN AND PELVIS WITH CONTRAST
TECHNIQUE: Multidetector CT imaging of the abdomen and pelvis was
performed following the standard protocol during bolus
administration of intravenous contrast.
Contrast: 125 ml of Umnipaque-R88

[Series 2: abd_pel 5.0 b40f st · axial · 0.73mm/px · z∈[-473,-83]mm · 13 of 88 slices shown, 15 images]
[im 5/88  soft-tissue]
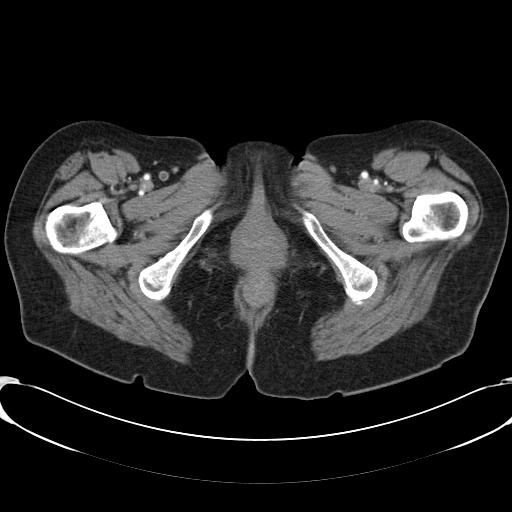
[im 5/88  bone]
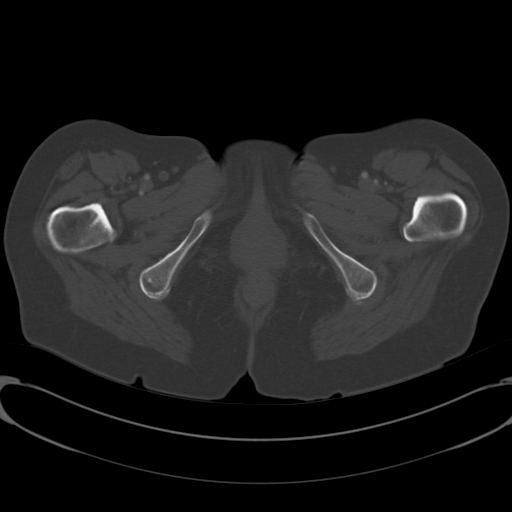
[im 14/88  soft-tissue]
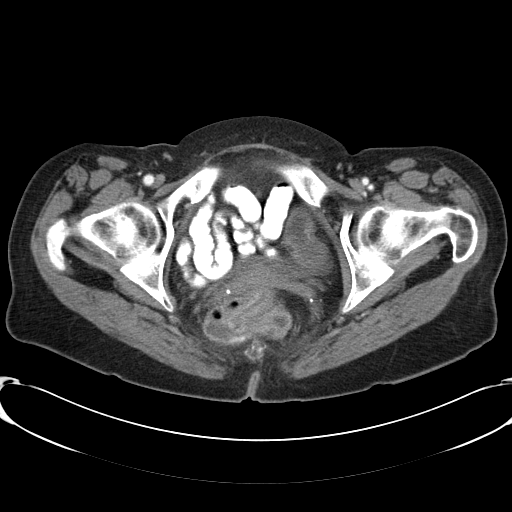
[im 19/88  soft-tissue]
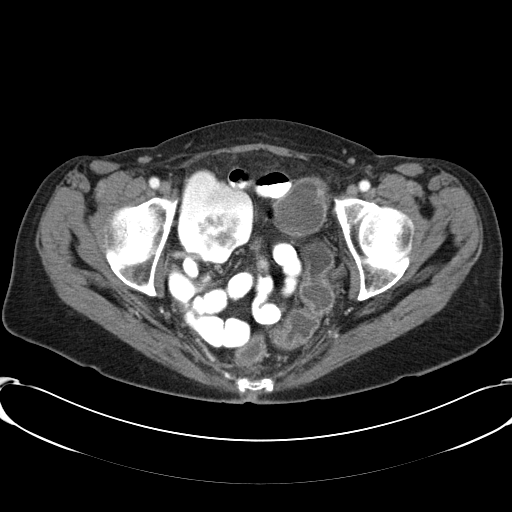
[im 23/88  soft-tissue]
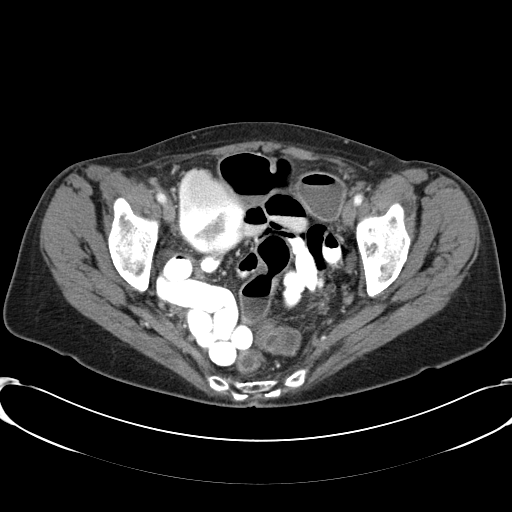
[im 33/88  soft-tissue]
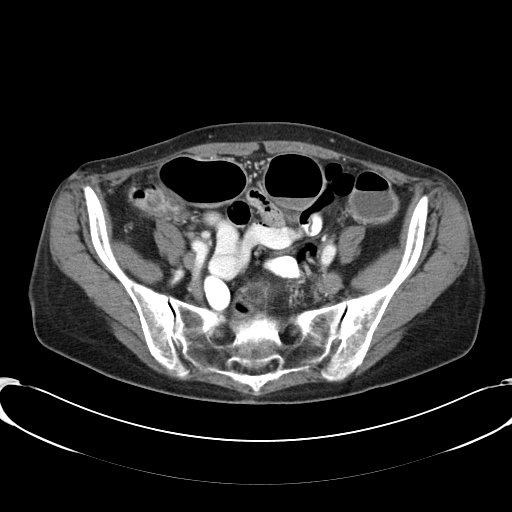
[im 37/88  soft-tissue]
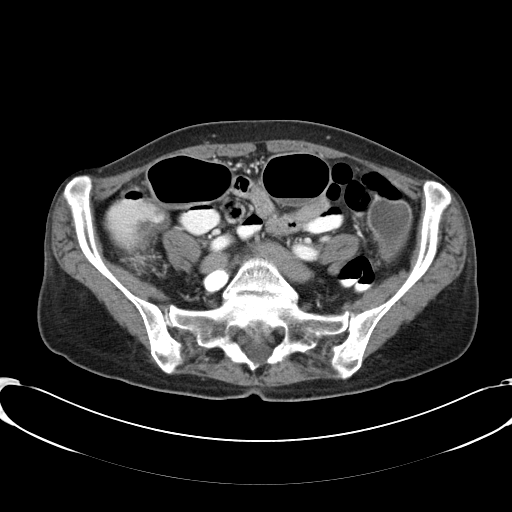
[im 46/88  soft-tissue]
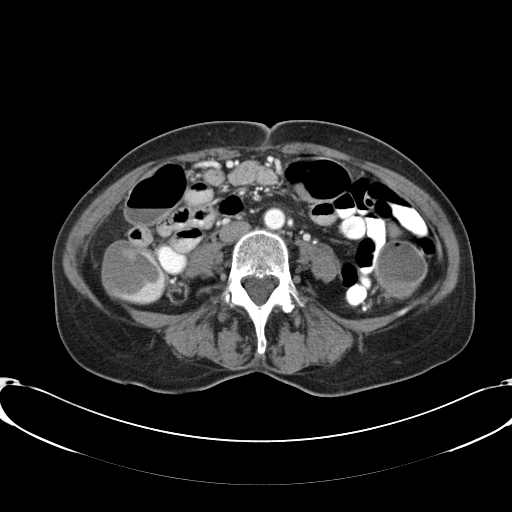
[im 51/88  soft-tissue]
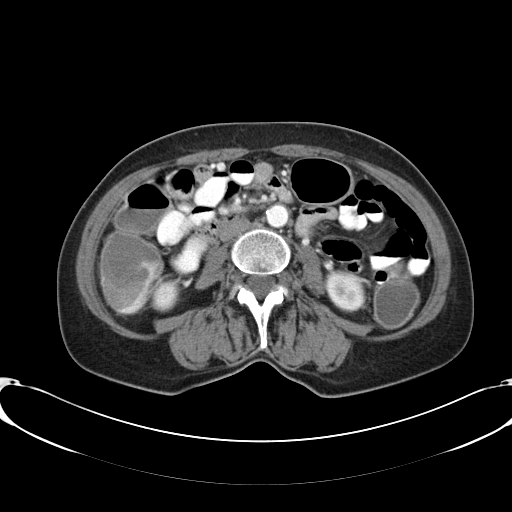
[im 55/88  soft-tissue]
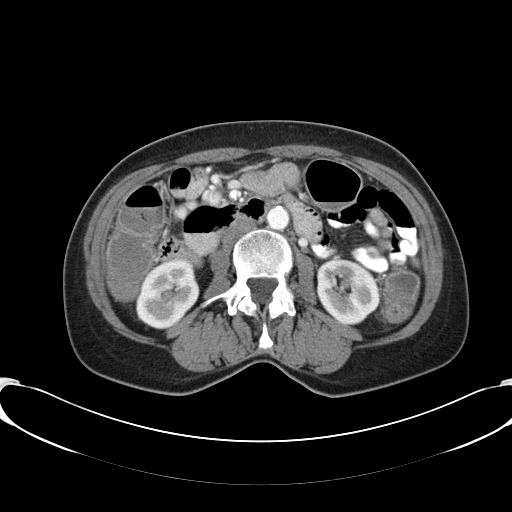
[im 55/88  bone]
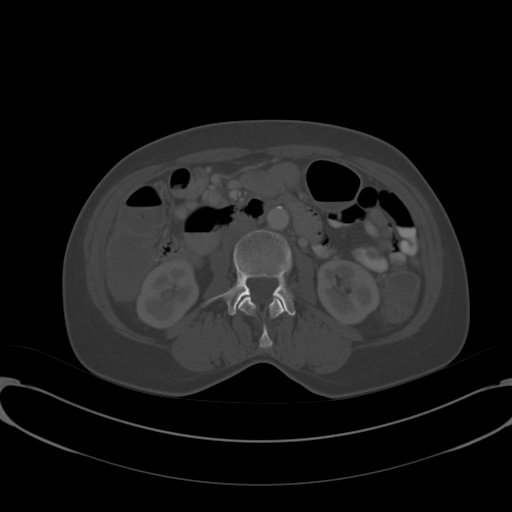
[im 65/88  soft-tissue]
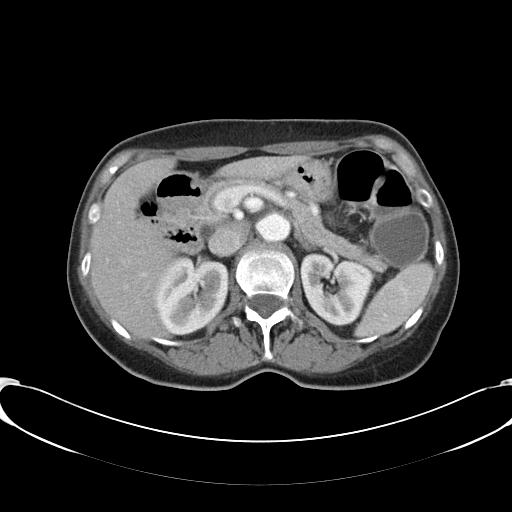
[im 69/88  soft-tissue]
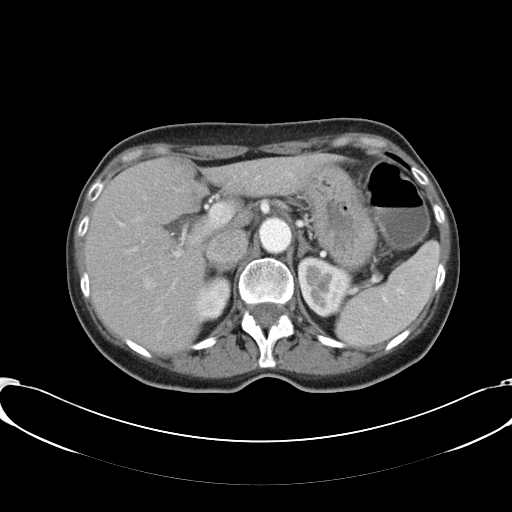
[im 74/88  soft-tissue]
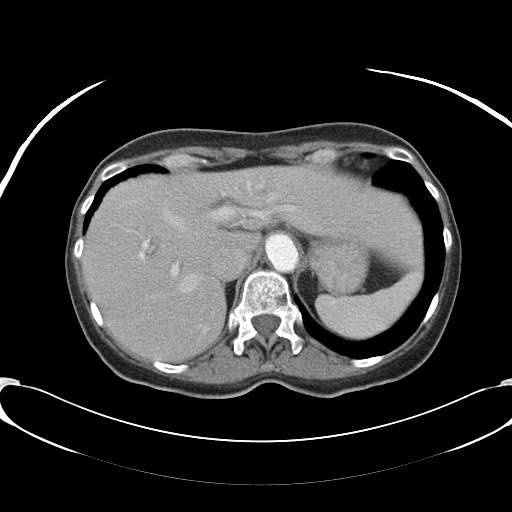
[im 83/88  soft-tissue]
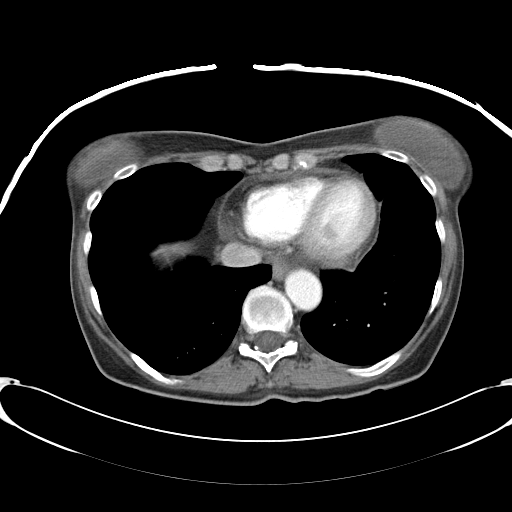

[Series 602: coronal abdomen · coronal · 0.89mm/px · 3 of 99 slices shown]
[im 33/99  soft-tissue]
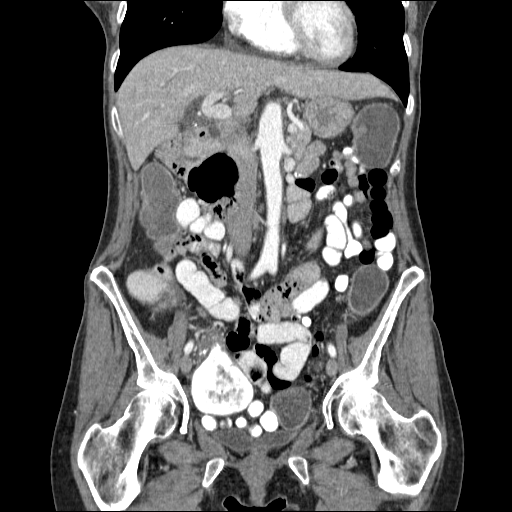
[im 44/99  soft-tissue]
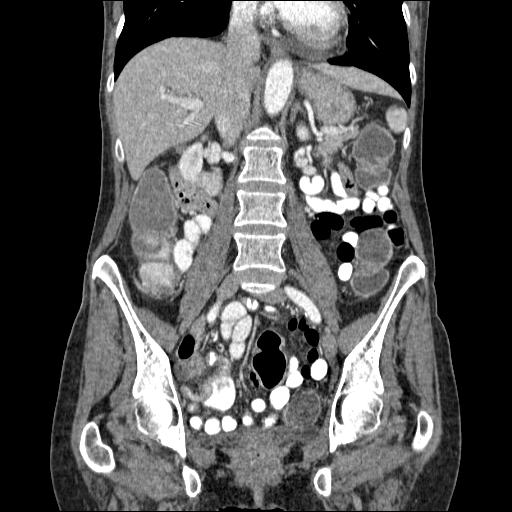
[im 55/99  soft-tissue]
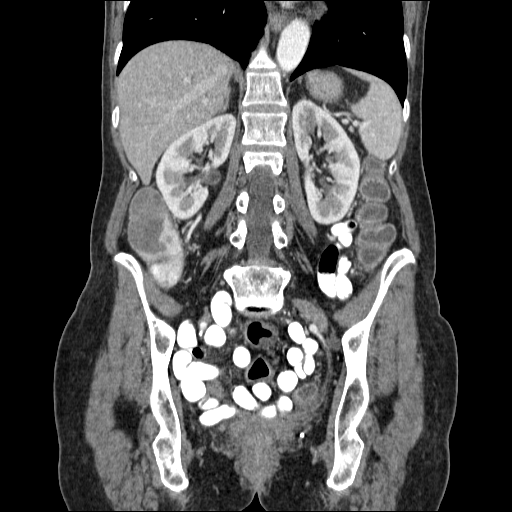

[16 of 46 positions shown; findings below may reference images not displayed]

FINDINGS: Right base atelectasis is present.  There is a 2 mm
pulmonary nodule in the left lower lobe on series 5, image 9.  The
heart is normal in size.  A small pericardial effusion is present.

A 1.5 x 0.6 cm hypo enhancing lesion is seen in the left hepatic
lobe, anteriorly which demonstrates filling on delayed images and
likely represents a hemangioma.  The adrenal glands, spleen and
pancreas are within normal limits.  The patient has had a
cholecystectomy.  Mild prominence is noted of the intrahepatic and
extrahepatic bile ducts with the common bile duct measuring up to
10 mm.  The kidneys enhance symmetrically and there is no
hydronephrosis.  The stomach is decompressed.  Debris is seen in
the duodenum but contrast passes through this region without
difficulty. A large duodenal diverticulum with an air-fluid level
is noted measuring 5.0 x 4.2 cm. No small bowel obstruction is
present.  The terminal ileum is normal appearance.  The cecum is
unremarkable.  At the junction of the cecum and ascending colon,
there is irregular wall thickening and adjacent stranding.  No
extraluminal gas or adjacent abscess is present.  No other
inflammatory changes are seen in the colon.

The aorta and major branch vessels are patent and there is no
aneurysm.  No lymphadenopathy is present.  The patient has had a
hysterectomy and no adnexal mass is seen.  No aggressive lytic or
blastic bone lesions are seen.  A Tarlov cyst is noted in the upper
sacral spine.  Anterolisthesis is noted of L5 on S1 measuring
approximately 3 mm.  Degenerative changes are noted and there is
underlying bilateral pars defect.
IMPRESSION: 1.  Irregular wall thickening and adjacent inflammatory change at
the junction of the cecum and ascending colon compatible with a
infectious or inflammatory colitis.  Correlation with prior
colonoscopy or GI consultation is recommended as underlying
malignancy cannot be excluded.
2.  Postsurgical changes of cholecystectomy with mild prominence of
the intrahepatic and extrahepatic bile ducts.  Correlation with
laboratory values is recommended to evaluate for underlying
obstruction.
3.  Large duodenal diverticulum.

## 2013-03-28 ENCOUNTER — Ambulatory Visit (INDEPENDENT_AMBULATORY_CARE_PROVIDER_SITE_OTHER): Payer: 59 | Admitting: Internal Medicine

## 2013-03-28 ENCOUNTER — Encounter: Payer: Self-pay | Admitting: Internal Medicine

## 2013-03-28 VITALS — BP 105/67 | HR 66 | Temp 98.2°F | Resp 16 | Wt 133.0 lb

## 2013-03-28 DIAGNOSIS — R590 Localized enlarged lymph nodes: Secondary | ICD-10-CM

## 2013-03-28 DIAGNOSIS — E876 Hypokalemia: Secondary | ICD-10-CM

## 2013-03-28 DIAGNOSIS — R599 Enlarged lymph nodes, unspecified: Secondary | ICD-10-CM

## 2013-03-28 NOTE — Patient Instructions (Signed)
Share results with Dr Wilburn Cornelia.

## 2013-03-28 NOTE — Progress Notes (Signed)
   Subjective:    Patient ID: [name], female    DOB: 07/24/1945, 68 y.o.   MRN: [id number]  HPI Has noticed swollen and painful ("annoying ache") lymph node in left lateral neck since approximately 11/2012. Denies any infection or cat scratch around time that lymph node appeared. Has chills at night several times/week. Denies fever and unintentional weight loss or gain. States she always has fatique and has not experienced worsening fatigue.   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 Had root canal left lower molar in last 6 months.      Objective:   Physical Exam Gen.: Thin but healthy and well-nourished in appearance. Alert, appropriate and cooperative throughout exam.Appears younger than stated age  Head: Normocephalic without obvious abnormalities  Eyes: No corneal or conjunctival inflammation noted.  Mouth: Oral mucosa and oropharynx reveal no lesions or exudates. Teeth in good repair. Neck: No deformities, masses, or tenderness noted.  Thyroid normal. Lungs: Normal respiratory effort; chest expands symmetrically. Lungs are clear to auscultation without rales, wheezes, or increased work of breathing. Heart: Normal rate and rhythm. Normal S1 and S2. No gallop, click, or rub. S4 w/o murmur. Abdomen: Bowel sounds normal; abdomen soft and nontender. No masses, organomegaly or hernias noted.Aorta palpable with bruit ; no AAA.         Musculoskeletal/extremities: No deformity or scoliosis noted of  the thoracic or lumbar spine.   No clubbing, cyanosis, edema, or significant extremity  deformity noted. Range of motion normal .Tone & strength normal. Hand joints normal . Fingernail  health good. Able to lie down & sit up w/o help.  Vascular: Carotid & radial artery pulses are full and equal. No bruits present. Neurologic: Alert and oriented x3. Deep tendon reflexes symmetrical and normal.  Gait normal  including heel & toe  walking . Rhomberg & finger to nose       Skin: Intact without suspicious lesions or rashes. Lymph: 1 x 1cm  lymph node left submandibular . No axillary, or inguinal lymphadenopathy present. Psych: Mood and affect are normal. Normally interactive                                                                                        Assessment & Plan:  #1 small, stable submandibular lymph node without pathologic character  Plan: I've asked her to monitor weekly. ENT assessment

## 2013-03-29 ENCOUNTER — Encounter: Payer: Self-pay | Admitting: *Deleted

## 2013-03-29 LAB — CBC WITH DIFFERENTIAL/PLATELET
BASOS ABS: 0 10*3/uL (ref 0.0–0.1)
BASOS PCT: 0.7 % (ref 0.0–3.0)
Eosinophils Absolute: 0.1 10*3/uL (ref 0.0–0.7)
Eosinophils Relative: 0.9 % (ref 0.0–5.0)
HEMATOCRIT: 38.5 % (ref 36.0–46.0)
HEMOGLOBIN: 13 g/dL (ref 12.0–15.0)
Lymphocytes Relative: 31.2 % (ref 12.0–46.0)
Lymphs Abs: 1.9 10*3/uL (ref 0.7–4.0)
MCHC: 33.8 g/dL (ref 30.0–36.0)
MCV: 89 fl (ref 78.0–100.0)
MONOS PCT: 6.5 % (ref 3.0–12.0)
Monocytes Absolute: 0.4 10*3/uL (ref 0.1–1.0)
NEUTROS ABS: 3.6 10*3/uL (ref 1.4–7.7)
Neutrophils Relative %: 60.7 % (ref 43.0–77.0)
Platelets: 228 10*3/uL (ref 150.0–400.0)
RBC: 4.33 Mil/uL (ref 3.87–5.11)
RDW: 14.5 % (ref 11.5–14.6)
WBC: 6 10*3/uL (ref 4.5–10.5)

## 2013-03-29 LAB — BASIC METABOLIC PANEL
BUN: 10 mg/dL (ref 6–23)
CHLORIDE: 104 meq/L (ref 96–112)
CO2: 31 meq/L (ref 19–32)
Calcium: 9.2 mg/dL (ref 8.4–10.5)
Creatinine, Ser: 0.7 mg/dL (ref 0.4–1.2)
GFR: 91.15 mL/min (ref >60.00)
GLUCOSE: 84 mg/dL (ref 70–99)
POTASSIUM: 3.8 meq/L (ref 3.5–5.1)
Sodium: 139 mEq/L (ref 135–145)

## 2013-03-29 LAB — SEDIMENTATION RATE: Sed Rate: 9 mm/hr (ref 0–22)

## 2013-04-22 ENCOUNTER — Other Ambulatory Visit: Payer: Self-pay

## 2013-04-22 MED ORDER — TRAZODONE HCL 150 MG PO TABS: 150.0000 mg | ORAL_TABLET | Freq: Every day | ORAL | Status: DC

## 2013-04-22 NOTE — Telephone Encounter (Signed)
Called into pharmacy

## 2013-05-04 ENCOUNTER — Ambulatory Visit (INDEPENDENT_AMBULATORY_CARE_PROVIDER_SITE_OTHER): Payer: 59 | Admitting: Internal Medicine

## 2013-05-04 ENCOUNTER — Encounter: Payer: Self-pay | Admitting: Internal Medicine

## 2013-05-04 ENCOUNTER — Ambulatory Visit: Payer: 59 | Admitting: Internal Medicine

## 2013-05-04 VITALS — BP 100/70 | HR 73 | Temp 98.0°F | Wt 134.6 lb

## 2013-05-04 DIAGNOSIS — J209 Acute bronchitis, unspecified: Secondary | ICD-10-CM

## 2013-05-04 MED ORDER — HYDROCODONE-HOMATROPINE 5-1.5 MG/5ML PO SYRP: 5.0000 mL | ORAL_SOLUTION | Freq: Four times a day (QID) | ORAL | Status: DC | PRN

## 2013-05-04 MED ORDER — AZITHROMYCIN 250 MG PO TABS: ORAL_TABLET | ORAL | Status: DC

## 2013-05-04 NOTE — Progress Notes (Signed)
   Subjective:    Patient ID: [name], female    DOB: Jan 04, 1946, 68 y.o.   MRN: [id number]  HPI Symptoms began on 02/17 with HA, ear pressure, N/V/D, muscle pain and weakness; Took flu shot but is unsure if she had the flu or not.  Reports she did not feel like coming to the doctor during that time. Reports these symptoms resolved, although cough lingers with occasional yellow sputum.  Reports heaviness or sharp sensation mid-sternum and R posterior thorax with deep inspiration.   Review of Systems No associated fever, chills, sweats, or weight loss are described.  Dyspnea & hemoptysis are absent.  There is no associated back pain with anterior radiation of discomfort.  Absent are dyspepsia, significant reflux or dysphagia.    Objective:   Physical Exam General appearance:thin but in good health ;well nourished; no acute distress or increased work of breathing is present. No lymphadenopathy about the head, neck, or axilla noted.  Eyes: No conjunctival inflammation or lid edema is present. There is no scleral icterus.  Ears: External ear exam shows no significant lesions or deformities. Otoscopic examination reveals clear canals, tympanic membranes are intact bilaterally without bulging, retraction, inflammation or discharge.  Nose: External nasal examination shows no deformity or inflammation. Nasal mucosa are pink and moist without lesions or exudates. No septal dislocation or deviation.No obstruction to airflow.  Oral exam: Dental hygiene is good; lips and gums are healthy appearing.There is no oropharyngeal erythema or exudate noted.  Neck: No deformities, masses, or tenderness noted. Supple with full range of motion without pain.  Heart: Normal rate and regular rhythm. S1 and S2 normal without gallop, murmur, click, rub or other extra sounds.  Lungs:Chest clear to auscultation; no wheezes, rhonchi,rales ,or rubs present.No increased work of breathing.  Extremities:  No cyanosis, edema, or clubbing noted  Skin: Warm & dry .     Assessment & Plan:  #1 bronchitis in context of recent influenza See orders

## 2013-05-04 NOTE — Progress Notes (Signed)
Pre visit review using our clinic review tool, if applicable. No additional management support is needed unless otherwise documented below in the visit note. 

## 2013-05-04 NOTE — Progress Notes (Signed)
   Subjective:    Patient ID: [name], female    DOB: March 18, 1945, 68 y.o.   MRN: [id number]  HPI Symptoms began on 02/17 with HA, ear pressure, N/V/D, muscle pain and weakness; Took flu shot but is unsure if she had the flu or not.  Reports she did not feel like coming to the doctor during that time. Reports these symptoms resolved, although cough lingers with occasional yellow sputum.  Reports heaviness in chest wall with deep inspiration, along with R posterior thorax region. Reports chills, sweats.   Review of Systems  Cough, dyspnea, sputum production, or hemoptysis are absent. Denies palpitations, dyspnea, paroxymal nocturnal dyspnea, or claudication, numbness or tingling in arms.  Absent are dyspepsia, significant reflux or dysphagia.     Objective:   Physical Exam General appearance:good health; appears younger than stated age, well nourished; no acute distress or increased work of breathing is present.  No  lymphadenopathy about the head, neck, or axilla noted.   Eyes: No conjunctival inflammation or lid edema is present. There is no scleral icterus.  Ears:  External ear exam shows no significant lesions or deformities.  Otoscopic examination reveals clear canals, tympanic membranes are intact bilaterally without bulging, retraction, inflammation or discharge.  Nose:  External nasal examination shows no deformity or inflammation. Nasal mucosa are pink and moist without lesions or exudates. No septal dislocation or deviation.No obstruction to airflow.   Oral exam: Dental hygiene is good; lips and gums are healthy appearing.There is no oropharyngeal erythema or exudate noted.   Neck:  No deformities, thyromegaly, masses, or tenderness noted.   Supple with full range of motion without pain.   Heart:  Normal rate and regular rhythm. S1 and S2 normal without gallop, murmur, click, rub or other extra sounds.   Lungs:Chest clear to auscultation; no wheezes,  rhonchi,rales ,or rubs present. No increased work of breathing.    Extremities:  No cyanosis, edema, or clubbing noted.   Skin: Warm & dry w/o jaundice or tenting.      Assessment & Plan:  #1 bronchitis

## 2013-05-04 NOTE — Patient Instructions (Addendum)
Please take the probiotic , Florastor OR Align, every day as needed while on he antibiotic. This will replace the normal bacteria which  are necessary for formation of normal stool and processing of food.

## 2013-05-17 ENCOUNTER — Ambulatory Visit (INDEPENDENT_AMBULATORY_CARE_PROVIDER_SITE_OTHER): Payer: 59 | Admitting: Internal Medicine

## 2013-05-17 ENCOUNTER — Encounter: Payer: Self-pay | Admitting: Internal Medicine

## 2013-05-17 VITALS — BP 100/60 | HR 78 | Temp 97.6°F | Resp 14 | Wt 138.0 lb

## 2013-05-17 DIAGNOSIS — R05 Cough: Secondary | ICD-10-CM

## 2013-05-17 DIAGNOSIS — R059 Cough, unspecified: Secondary | ICD-10-CM

## 2013-05-17 DIAGNOSIS — J45909 Unspecified asthma, uncomplicated: Secondary | ICD-10-CM

## 2013-05-17 MED ORDER — HYDROCOD POLST-CHLORPHEN POLST 10-8 MG/5ML PO LQCR: 5.0000 mL | Freq: Two times a day (BID) | ORAL | Status: DC | PRN

## 2013-05-17 MED ORDER — FLUTICASONE-SALMETEROL 250-50 MCG/DOSE IN AEPB: 1.0000 | INHALATION_SPRAY | Freq: Two times a day (BID) | RESPIRATORY_TRACT | Status: DC

## 2013-05-17 MED ORDER — MONTELUKAST SODIUM 10 MG PO TABS: 10.0000 mg | ORAL_TABLET | Freq: Every day | ORAL | Status: DC

## 2013-05-17 NOTE — Progress Notes (Signed)
   Subjective:    Patient ID: [name], female    DOB: 01/14/1946, 68 y.o.   MRN: [id number]  HPI  Her symptoms began 05/03/13 following recovery from the flu syndrome. She describes cough which can vary from being dry to productive of clear phlegm with some yellow coloration. She states that she bring up to 10 tablespoons of the clear material in a 24 period.  The cough syrup is of short-term benefit  She's had associated chills. The cough has been associated with chest and back discomfort  She does not smoke as of 2000; she previously smoked up to half a pack a day.     Review of Systems  She is not having extrinsic symptoms of itchy, watery eyes. She has not had fever with chills. There is no pleuritic chest. She has no nasal purulence or frontal or maxillary sinus pain. The cough is not associated with wheezing or shortness of breath.  Reflux is not a significant component to her cough.     Objective:   Physical Exam General appearance:good health ;well nourished; no acute distress or increased work of breathing is present.  No  lymphadenopathy about the head, neck, or axilla noted.   Eyes: No conjunctival inflammation or lid edema is present.   Ears:  External ear exam shows no significant lesions or deformities.  Otoscopic examination reveals clear canals, tympanic membranes are intact bilaterally without bulging, retraction, inflammation or discharge.  Nose:  External nasal examination shows no deformity or inflammation. Nasal mucosa are erythematous and moist without lesions or exudates. No septal dislocation or deviation.No obstruction to airflow.   Oral exam: Dental hygiene is good; lips and gums are healthy appearing.There is no oropharyngeal erythema or exudate noted.   Neck:  No deformities, thyromegaly, masses, or tenderness noted.   Supple with full range of motion without pain.   Heart:  Normal rate and regular rhythm. S1 and S2 normal without  gallop, murmur, click, rub or other extra sounds.   Lungs:Chest clear to auscultation; no wheezes, rhonchi,rales ,or rubs present.No increased work of breathing.    Extremities:  No cyanosis, edema, or clubbing  noted    Skin: Warm & dry .         Assessment & Plan:  #1 bronchitis w/o bronchospasm #2 rhinitis Plan: See orders and recommendations

## 2013-05-17 NOTE — Patient Instructions (Signed)
Plain Mucinex (NOT D) for thick secretions ;force NON dairy fluids .   Nasal cleansing in the shower as discussed with lather of mild shampoo.After 10 seconds wash off lather while  exhaling through nostrils. Make sure that all residual soap is removed to prevent irritation.  Flonase OR Nasacort AQ 1 spray in each nostril twice a day as needed. Use the "crossover" technique into opposite nostril spraying toward opposite ear @ 45 degree angle, not straight up into nostril.  Use a Neti pot daily only  as needed for significant sinus congestion; going from open side to congested side . Plain Allegra (NOT D )  160 daily , Loratidine 10 mg , OR Zyrtec 10 mg @ bedtime  as needed for itchy eyes & sneezing.  Advair one inhalation every 12 hours; gargle and spit after use 

## 2013-05-17 NOTE — Progress Notes (Signed)
Pre visit review using our clinic review tool, if applicable. No additional management support is needed unless otherwise documented below in the visit note. 

## 2013-05-24 ENCOUNTER — Other Ambulatory Visit: Payer: Self-pay

## 2013-05-24 MED ORDER — MIDODRINE HCL 10 MG PO TABS: 10.0000 mg | ORAL_TABLET | Freq: Three times a day (TID) | ORAL | Status: DC

## 2013-05-24 NOTE — Telephone Encounter (Signed)
Refill sent for midodrine hcl 10 mg take one tablet tid

## 2013-06-06 ENCOUNTER — Ambulatory Visit: Payer: 59 | Admitting: Neurology

## 2013-08-18 ENCOUNTER — Ambulatory Visit (INDEPENDENT_AMBULATORY_CARE_PROVIDER_SITE_OTHER): Payer: 59 | Admitting: Cardiovascular Disease

## 2013-08-18 ENCOUNTER — Encounter: Payer: Self-pay | Admitting: Cardiovascular Disease

## 2013-08-18 VITALS — BP 100/70 | HR 63 | Ht 68.5 in | Wt 136.3 lb

## 2013-08-18 DIAGNOSIS — R5381 Other malaise: Secondary | ICD-10-CM

## 2013-08-18 DIAGNOSIS — R002 Palpitations: Secondary | ICD-10-CM

## 2013-08-18 DIAGNOSIS — I951 Orthostatic hypotension: Secondary | ICD-10-CM

## 2013-08-18 DIAGNOSIS — R079 Chest pain, unspecified: Secondary | ICD-10-CM

## 2013-08-18 DIAGNOSIS — R5383 Other fatigue: Secondary | ICD-10-CM

## 2013-08-18 MED ORDER — MIDODRINE HCL 10 MG PO TABS: 10.0000 mg | ORAL_TABLET | Freq: Three times a day (TID) | ORAL | Status: DC

## 2013-08-18 NOTE — Assessment & Plan Note (Signed)
improved symptoms with the midodrine 10 mg twice a day. She would like to stay on the medication as she has less fatigue.

## 2013-08-18 NOTE — Progress Notes (Signed)
Patient ID: [name], female    DOB: Jul 28, 1945, 68 y.o.   MRN: [id number]  HPI Comments: 68 yo WF with history of hyperlipidemia,fibromyalgia, extensive GI disease including hiatal hernia, GERD, gastritis, Schatzkes ring s/p dilatations here today for follow up.  She has chronically low blood pressure and in the past has had chronic dizziness and fatigue. She also reports having obstructive sleep apnea, significant snoring and CPAP. She's not wearing her CPAP and it is years outdated.  previous episodes of heaviness in her chest. Cardiac catheterization in the past showed no significant disease. Holter monitor showed short runs of SVT. 48-hour study.  episodes of shortness of breath, 30 day event monitor showing ABCs but no significant SVT.   Previous sinus surgery and reports her breathing is better. Still continues to snore with frequent waking at night In followup today, she feels well overall, is taking midodrine at least twice per day on a regular basis for blood pressure support. Without the midodrine, she has increasing fatigue. She is otherwise active, going to the beach, playing with her grandchildren. Continues to have some  Fatigue but she attributes this to sleeping poorly . She takes trazodone for sleep . Occasionally has palpitations  Prior echocardiogram showed mild MR with no other significant abnormalities. Strong family history of coronary artery disease.  cholesterol 132, HDL 68, LDL 54  EKG today shows normal sinus rhythm with rate 63 beats per minute with no significant ST or T wave changes   Outpatient Encounter Prescriptions as of 08/18/2013  Medication Sig  . atorvastatin (LIPITOR) 20 MG tablet Take 1 tablet (20 mg total) by mouth daily.  [name] b complex vitamins capsule Take 1 capsule by mouth daily.  . Biotin 1000 MCG tablet Take 1,000 mcg by mouth daily.    [name] esomeprazole (NEXIUM) 40 MG capsule Take 1 capsule (40 mg total) by mouth 2 (two) times daily.   [name] estradiol (ESTRACE) 0.1 MG/GM vaginal cream Apply twice a week  . Magnesium Oxide 250 MG TABS Take 250 mg by mouth daily.   . midodrine (PROAMATINE) 10 MG tablet Take 1 tablet (10 mg total) by mouth 3 (three) times daily.  . montelukast (SINGULAIR) 10 MG tablet Take 1 tablet (10 mg total) by mouth at bedtime.  . Probiotic Product (ALIGN) 4 MG CAPS Take by mouth daily.   . traZODone (DESYREL) 150 MG tablet Take 1 tablet (150 mg total) by mouth at bedtime. Take 1-2 tablets by mouth at bedtime  . vitamin C (ASCORBIC ACID) 500 MG tablet Take 500 mg by mouth daily.    Review of Systems  Constitutional: Positive for fatigue.  HENT: Negative.   Eyes: Negative.   Respiratory: Negative.   Cardiovascular: Negative.   Gastrointestinal: Negative.   Endocrine: Negative.   Musculoskeletal: Negative.   Skin: Negative.   Allergic/Immunologic: Negative.   Neurological: Negative.   Hematological: Negative.   Psychiatric/Behavioral: Negative.   All other systems reviewed and are negative.   BP 100/70  Pulse 63  Ht 5' 8.5" (1.74 m)  Wt 136 lb 5 oz (61.831 kg)  BMI 20.42 kg/m2  Physical Exam  Nursing note and vitals reviewed. Constitutional: She is oriented to person, place, and time. She appears well-developed and well-nourished.  Thin  HENT:  Head: Normocephalic.  Nose: Nose normal.  Mouth/Throat: Oropharynx is clear and moist.  Eyes: Conjunctivae are normal. Pupils are equal, round, and reactive to light.  Neck: Normal range of motion. Neck supple. No JVD present.  Cardiovascular: Normal rate, regular rhythm, S1 normal, S2 normal, normal heart sounds and intact distal pulses.  Exam reveals no gallop and no friction rub.   No murmur heard. Pulmonary/Chest: Effort normal and breath sounds normal. No respiratory distress. She has no wheezes. She has no rales. She exhibits no tenderness.  Abdominal: Soft. Bowel sounds are normal. She exhibits no distension. There is no tenderness.   Musculoskeletal: Normal range of motion. She exhibits no edema and no tenderness.  Lymphadenopathy:    She has no cervical adenopathy.  Neurological: She is alert and oriented to person, place, and time. Coordination normal.  Skin: Skin is warm and dry. No rash noted. No erythema.  Psychiatric: She has a normal mood and affect. Her behavior is normal. Judgment and thought content normal.    Assessment and Plan

## 2013-08-18 NOTE — Assessment & Plan Note (Signed)
That he has been a chronic issue. Some improvement with midodrine for blood pressure support though uncertain if this is helping. Continue for now. Sinus surgery has helped her snoring but she continues to snore and has frequent wakening. Continues to have insomnia, takes trazodone.

## 2013-08-18 NOTE — Patient Instructions (Signed)
You are doing well. No medication changes were made.  Please call us if you have new issues that need to be addressed before your next appt.  Your physician wants you to follow-up in: 12 months.  You will receive a reminder letter in the mail two months in advance. If you don't receive a letter, please call our office to schedule the follow-up appointment. 

## 2013-08-22 ENCOUNTER — Other Ambulatory Visit: Payer: Self-pay

## 2013-08-22 ENCOUNTER — Other Ambulatory Visit: Payer: Self-pay | Admitting: Gynecology

## 2013-08-22 MED ORDER — TRAZODONE HCL 150 MG PO TABS: ORAL_TABLET | ORAL | Status: DC

## 2013-08-22 MED ORDER — TRAZODONE HCL 150 MG PO TABS: 150.0000 mg | ORAL_TABLET | Freq: Every day | ORAL | Status: DC

## 2013-08-22 NOTE — Telephone Encounter (Signed)
Called into pharmacy

## 2013-08-24 ENCOUNTER — Telehealth: Payer: Self-pay | Admitting: Gastroenterology

## 2013-08-24 NOTE — Telephone Encounter (Signed)
Spoke with patient and she states she is having pain from esophagus to middle of chest since she got upset on Sunday. She reports a hx of GERD. She is asking if nerves can cause this. Discussed with patient that she cannot be diagnosed over the phone. She is out of town. Suggested she go to Urgent care or ED if she is having chest pain. She wants to schedule OV when she returns. Scheduled with Nicoletta Ba, PA on 08/29/13 at 1:30 pm.

## 2013-08-29 ENCOUNTER — Ambulatory Visit: Payer: 59 | Admitting: Physician Assistant

## 2013-09-10 ENCOUNTER — Other Ambulatory Visit: Payer: Self-pay | Admitting: Internal Medicine

## 2013-10-21 ENCOUNTER — Encounter: Payer: 59 | Admitting: Gynecology

## 2013-11-02 ENCOUNTER — Other Ambulatory Visit: Payer: Self-pay | Admitting: Gynecology

## 2013-11-02 ENCOUNTER — Telehealth: Payer: Self-pay

## 2013-11-02 ENCOUNTER — Ambulatory Visit (INDEPENDENT_AMBULATORY_CARE_PROVIDER_SITE_OTHER): Payer: 59 | Admitting: Gynecology

## 2013-11-02 ENCOUNTER — Encounter: Payer: Self-pay | Admitting: Gynecology

## 2013-11-02 ENCOUNTER — Ambulatory Visit (HOSPITAL_COMMUNITY)
Admission: RE | Admit: 2013-11-02 | Discharge: 2013-11-02 | Disposition: A | Discharge status: Home / Self Care | Payer: 59 | Source: Ambulatory Visit | Attending: Gynecology | Admitting: Gynecology

## 2013-11-02 VITALS — BP 106/68 | Ht 68.5 in | Wt 136.5 lb

## 2013-11-02 DIAGNOSIS — R05 Cough: Secondary | ICD-10-CM | POA: Insufficient documentation

## 2013-11-02 DIAGNOSIS — R5383 Other fatigue: Secondary | ICD-10-CM

## 2013-11-02 DIAGNOSIS — F419 Anxiety disorder, unspecified: Secondary | ICD-10-CM

## 2013-11-02 DIAGNOSIS — M949 Disorder of cartilage, unspecified: Secondary | ICD-10-CM

## 2013-11-02 DIAGNOSIS — R059 Cough, unspecified: Secondary | ICD-10-CM

## 2013-11-02 DIAGNOSIS — J984 Other disorders of lung: Secondary | ICD-10-CM | POA: Diagnosis not present

## 2013-11-02 DIAGNOSIS — Z79890 Hormone replacement therapy (postmenopausal): Secondary | ICD-10-CM

## 2013-11-02 DIAGNOSIS — M8580 Other specified disorders of bone density and structure, unspecified site: Secondary | ICD-10-CM

## 2013-11-02 DIAGNOSIS — F411 Generalized anxiety disorder: Secondary | ICD-10-CM

## 2013-11-02 DIAGNOSIS — R5381 Other malaise: Secondary | ICD-10-CM | POA: Insufficient documentation

## 2013-11-02 DIAGNOSIS — M899 Disorder of bone, unspecified: Secondary | ICD-10-CM

## 2013-11-02 DIAGNOSIS — N952 Postmenopausal atrophic vaginitis: Secondary | ICD-10-CM

## 2013-11-02 MED ORDER — ALPRAZOLAM 0.25 MG PO TABS: 0.2500 mg | ORAL_TABLET | Freq: Every evening | ORAL | Status: DC | PRN

## 2013-11-02 MED ORDER — TRAZODONE HCL 150 MG PO TABS: ORAL_TABLET | ORAL | Status: DC

## 2013-11-02 MED ORDER — AMOXICILLIN-POT CLAVULANATE 875-125 MG PO TABS: 1.0000 | ORAL_TABLET | Freq: Two times a day (BID) | ORAL | Status: DC

## 2013-11-02 MED ORDER — HYDROCOD POLST-CPM POLST ER 5-4 MG PO CP12: 1.0000 | ORAL_CAPSULE | Freq: Two times a day (BID) | ORAL | Status: DC | PRN

## 2013-11-02 MED ORDER — ESTRADIOL 0.1 MG/GM VA CREA: TOPICAL_CREAM | VAGINAL | Status: DC

## 2013-11-02 NOTE — Progress Notes (Signed)
[name] [birth year] [id number]   History:    68 y.o.  for  GYN exam and had several complaints. Patient stated for the past 3 weeks she's had a postnasal drip along with a productive cough. She states she has some chills and she felt warm other should not take her temperature. She denies being exposed to anyone recently sick or been overseas. She was a smoker many years ago. Her PCP is Dr. Linna Darner who has been doing her blood work.  The patient was seen for the first time as a new patient in 2014. She was previously been followed by Dr. Ubaldo Glassing.Review of patient's notes indicated that she has a history in 2009 of a vaginal hysterectomy with anterior and posterior repair as well as enterocele repair and colposuspension. She has done well and is taking estrogen vaginal cream twice a week although at times she still feels dry and discomfort during intercourse.She takes trazodone 150 mg before bedtime for insomnia she also has depression for which she had been on Zoloft 50 mg daily. Patient's last bone density study was in November 2014 demonstrated her lowest T score  was -2.1 at the AP spine and her lowest femoral neck was -1.6 at the right hip. Patient had normal  Frax analysis.    last colonoscopy normal in 2004. Patient would no prior history of abnormal Pap smears. All her vaccines are up to date.   Past medical history,surgical history, family history and social history were all reviewed and documented in the EPIC chart.  Gynecologic History No LMP recorded. Patient has had a hysterectomy. Contraception: status post hysterectomy Last Pap:  2013 . Results were: normal Last mammogram:  2014 . Results were: normal  Obstetric History OB History  Gravida Para Term Preterm AB SAB TAB Ectopic Multiple Living  2 2        2     # Outcome Date GA Lbr Len/2nd Weight Sex Delivery Anes PTL Lv  2 PAR           1 PAR                ROS: A ROS was performed and pertinent positives and  negatives are included in the history.  GENERAL: No fevers or chills. HEENT: No change in vision, no earache, sore throat or sinus congestion. NECK: No pain or stiffness. CARDIOVASCULAR: No chest pain or pressure. No palpitations. PULMONARY:  productive cough  GASTROINTESTINAL: No abdominal pain, nausea, vomiting or diarrhea, melena or bright red blood per rectum. GENITOURINARY: No urinary frequency, urgency, hesitancy or dysuria. MUSCULOSKELETAL: No joint or muscle pain, no back pain, no recent trauma. DERMATOLOGIC: No rash, no itching, no lesions. ENDOCRINE: No polyuria, polydipsia, no heat or cold intolerance. No recent change in weight. HEMATOLOGICAL: No anemia or easy bruising or bleeding. NEUROLOGIC: No headache, seizures, numbness, tingling or weakness. PSYCHIATRIC: No depression, no loss of interest in normal activity or change in sleep pattern.     Exam: chaperone present  BP 106/68  Ht 5' 8.5" (1.74 m)  Wt 136 lb 8 oz (61.916 kg)  BMI 20.45 kg/m2  Body mass index is 20.45 kg/(m^2).  General appearance : Well developed well nourished female. No acute distress HEENT: Neck supple, trachea midline, no carotid bruits, no thyroidmegaly Lungs:  his patellar rhonchi bilateral lung fields Heart: Regular rate and rhythm, no murmurs or gallops Breast:Examined in sitting and supine position were symmetrical in appearance, no palpable masses or tenderness,  no skin retraction, no  nipple inversion, no nipple discharge, no skin discoloration, no axillary or supraclavicular lymphadenopathy Abdomen: no palpable masses or tenderness, no rebound or guarding Extremities: no edema or skin discoloration or tenderness  Pelvic:  Bartholin, Urethra, Skene Glands: Within normal limits             Vagina: No gross lesions or discharge  Cervix:  Absent  Uterus  Absent  Adnexa  Without masses or tenderness  Anus and perineum  normal   Rectovaginal  normal sphincter tone without palpated masses or  tenderness             Hemoccult PCP we'll provide      Assessment/Plan:  68 y.o. with mild bronchitis. The patient will be started on Augmentin 875-125 to take one tablet twice a day for 7 days. She will also be prescribed  Tussicap 5-4 mg capsule to take every 12 hours for her coughing. A chest x-ray PA and lateral was earlier today returned to be normal. Prescription refill for Estrace vaginal cream to apply twice a week was provided. To help with her anxiety she may take 0.25 mg and Xanax when necessary. Blood work will be drawn by her PCP. If she has no improvement in her cough within one week after the above regimen she will contact the office and we will refer her to a pulmonologist. She was reminded to schedule her mammogram this year. Pap smear not done. We discussed importance of calcium vitamin D and regular exercise for osteoporosis prevention.    Note: This dictation was prepared with  Dragon/digital dictation along withSmart phrase technology. Any transcriptional errors that result from this process are unintentional.   Terrance Mass MD, 1:41 PM 11/02/2013

## 2013-11-02 NOTE — Telephone Encounter (Signed)
Mucinex OTC

## 2013-11-02 NOTE — Patient Instructions (Addendum)
Alprazolam tablets What is this medicine? ALPRAZOLAM (al PRAY zoe lam) is a benzodiazepine. It is used to treat anxiety and panic attacks. This medicine may be used for other purposes; ask your health care provider or pharmacist if you have questions. COMMON BRAND NAME(S): Xanax What should I tell my health care provider before I take this medicine? They need to know if you have any of these conditions: -an alcohol or drug abuse problem -bipolar disorder, depression, psychosis or other mental health conditions -glaucoma -kidney or liver disease -lung or breathing disease -myasthenia gravis -Parkinson's disease -porphyria -seizures or a history of seizures -suicidal thoughts -an unusual or allergic reaction to alprazolam, other benzodiazepines, foods, dyes, or preservatives -pregnant or trying to get pregnant -breast-feeding How should I use this medicine? Take this medicine by mouth with a glass of water. Follow the directions on the prescription label. Take your medicine at regular intervals. Do not take it more often than directed. If you have been taking this medicine regularly for some time, do not suddenly stop taking it. You must gradually reduce the dose or you may get severe side effects. Ask your doctor or health care professional for advice. Even after you stop taking this medicine it can still affect your body for several days. Talk to your pediatrician regarding the use of this medicine in children. Special care may be needed. Overdosage: If you think you have taken too much of this medicine contact a poison control center or emergency room at once. NOTE: This medicine is only for you. Do not share this medicine with others. What if I miss a dose? If you miss a dose, take it as soon as you can. If it is almost time for your next dose, take only that dose. Do not take double or extra doses. What may interact with this medicine? Do not take this medicine with any of the  following medications: -certain medicines for HIV infection or AIDS -ketoconazole -itraconazole This medicine may also interact with the following medications: -birth control pills -certain macrolide antibiotics like clarithromycin, erythromycin, troleandomycin -cimetidine -cyclosporine -ergotamine -grapefruit juice -herbal or dietary supplements like kava kava, melatonin, dehydroepiandrosterone, DHEA, St. John's Wort or valerian -imatinib, STI-571 -isoniazid -levodopa -medicines for depression, anxiety, or psychotic disturbances -prescription pain medicines -rifampin, rifapentine, or rifabutin -some medicines for blood pressure or heart problems -some medicines for seizures like carbamazepine, oxcarbazepine, phenobarbital, phenytoin, primidone This list may not describe all possible interactions. Give your health care provider a list of all the medicines, herbs, non-prescription drugs, or dietary supplements you use. Also tell them if you smoke, drink alcohol, or use illegal drugs. Some items may interact with your medicine. What should I watch for while using this medicine? Visit your doctor or health care professional for regular checks on your progress. Your body can become dependent on this medicine. Ask your doctor or health care professional if you still need to take it. You may get drowsy or dizzy. Do not drive, use machinery, or do anything that needs mental alertness until you know how this medicine affects you. To reduce the risk of dizzy and fainting spells, do not stand or sit up quickly, especially if you are an older patient. Alcohol may increase dizziness and drowsiness. Avoid alcoholic drinks. Do not treat yourself for coughs, colds or allergies without asking your doctor or health care professional for advice. Some ingredients can increase possible side effects. What side effects may I notice from receiving this medicine? Side effects that you  should report to your doctor  or health care professional as soon as possible: -allergic reactions like skin rash, itching or hives, swelling of the face, lips, or tongue -confusion, forgetfulness -depression -difficulty sleeping -difficulty speaking -feeling faint or lightheaded, falls -mood changes, excitability or aggressive behavior -muscle cramps -trouble passing urine or change in the amount of urine -unusually weak or tired Side effects that usually do not require medical attention (report to your doctor or health care professional if they continue or are bothersome): -change in sex drive or performance -changes in appetite This list may not describe all possible side effects. Call your doctor for medical advice about side effects. You may report side effects to FDA at 1-800-FDA-1088. Where should I keep my medicine? Keep out of the reach of children. This medicine can be abused. Keep your medicine in a safe place to protect it from theft. Do not share this medicine with anyone. Selling or giving away this medicine is dangerous and against the law. Store at room temperature between 20 and 25 degrees C (68 and 77 degrees F). Throw away any unused medicine after the expiration date. NOTE: This sheet is a summary. It may not cover all possible information. If you have questions about this medicine, talk to your doctor, pharmacist, or health care provider.  2015, Elsevier/Gold Standard. (2007-05-13 10:34:46) Generalized Anxiety Disorder Generalized anxiety disorder (GAD) is a mental disorder. It interferes with life functions, including relationships, work, and school. GAD is different from normal anxiety, which everyone experiences at some point in their lives in response to specific life events and activities. Normal anxiety actually helps [place] prepare for and get through these life events and activities. Normal anxiety goes away after the event or activity is over.  GAD causes anxiety that is not necessarily related  to specific events or activities. It also causes excess anxiety in proportion to specific events or activities. The anxiety associated with GAD is also difficult to control. GAD can vary from mild to severe. People with severe GAD can have intense waves of anxiety with physical symptoms (panic attacks).  SYMPTOMS The anxiety and worry associated with GAD are difficult to control. This anxiety and worry are related to many life events and activities and also occur more days than not for 6 months or longer. People with GAD also have three or more of the following symptoms (one or more in children):  Restlessness.   Fatigue.  Difficulty concentrating.   Irritability.  Muscle tension.  Difficulty sleeping or unsatisfying sleep. DIAGNOSIS GAD is diagnosed through an assessment by your health care provider. Your health care provider will ask you questions aboutyour mood,physical symptoms, and events in your life. Your health care provider may ask you about your medical history and use of alcohol or drugs, including prescription medicines. Your health care provider may also do a physical exam and blood tests. Certain medical conditions and the use of certain substances can cause symptoms similar to those associated with GAD. Your health care provider may refer you to a mental health specialist for further evaluation. TREATMENT The following therapies are usually used to treat GAD:   Medication. Antidepressant medication usually is prescribed for long-term daily control. Antianxiety medicines may be added in severe cases, especially when panic attacks occur.   Talk therapy (psychotherapy). Certain types of talk therapy can be helpful in treating GAD by providing support, education, and guidance. A form of talk therapy called cognitive behavioral therapy can teach you healthy ways to think  about and react to daily life events and activities.  Stress managementtechniques. These include yoga,  meditation, and exercise and can be very helpful when they are practiced regularly. A mental health specialist can help determine which treatment is best for you. Some people see improvement with one therapy. However, other people require a combination of therapies. Document Released: 06/14/2012 Document Revised: 07/04/2013 Document Reviewed: 06/14/2012 [facility] Patient Information 2015 [place], [place]. This information is not intended to replace advice given to you by your health care provider. Make sure you discuss any questions you have with your health care provider.  Amoxicillin; Clavulanic Acid tablets What is this medicine? AMOXICILLIN; CLAVULANIC ACID (a mox i SIL in; KLAV yoo lan ic AS id) is a penicillin antibiotic. It is used to treat certain kinds of bacterial infections. It will not work for colds, flu, or other viral infections. This medicine may be used for other purposes; ask your health care provider or pharmacist if you have questions. COMMON BRAND NAME(S): Augmentin What should I tell my health care provider before I take this medicine? They need to know if you have any of these conditions: -bowel disease, like colitis -kidney disease -liver disease -mononucleosis -an unusual or allergic reaction to amoxicillin, penicillin, cephalosporin, other antibiotics, clavulanic acid, other medicines, foods, dyes, or preservatives -pregnant or trying to get pregnant -breast-feeding How should I use this medicine? Take this medicine by mouth with a full glass of water. Follow the directions on the prescription label. Take at the start of a meal. Do not crush or chew. If the tablet has a score line, you may cut it in half at the score line for easier swallowing. Take your medicine at regular intervals. Do not take your medicine more often than directed. Take all of your medicine as directed even if you think you are better. Do not skip doses or stop your medicine early. Talk to your  pediatrician regarding the use of this medicine in children. Special care may be needed. Overdosage: If you think you have taken too much of this medicine contact a poison control center or emergency room at once. NOTE: This medicine is only for you. Do not share this medicine with others. What if I miss a dose? If you miss a dose, take it as soon as you can. If it is almost time for your next dose, take only that dose. Do not take double or extra doses. What may interact with this medicine? -allopurinol -anticoagulants -birth control pills -methotrexate -probenecid This list may not describe all possible interactions. Give your health care provider a list of all the medicines, herbs, non-prescription drugs, or dietary supplements you use. Also tell them if you smoke, drink alcohol, or use illegal drugs. Some items may interact with your medicine. What should I watch for while using this medicine? Tell your doctor or health care professional if your symptoms do not improve. Do not treat diarrhea with over the counter products. Contact your doctor if you have diarrhea that lasts more than 2 days or if it is severe and watery. If you have diabetes, you may get a false-positive result for sugar in your urine. Check with your doctor or health care professional. Birth control pills may not work properly while you are taking this medicine. Talk to your doctor about using an extra method of birth control. What side effects may I notice from receiving this medicine? Side effects that you should report to your doctor or health care professional as soon  as possible: -allergic reactions like skin rash, itching or hives, swelling of the face, lips, or tongue -breathing problems -dark urine -fever or chills, sore throat -redness, blistering, peeling or loosening of the skin, including inside the mouth -seizures -trouble passing urine or change in the amount of urine -unusual bleeding,  bruising -unusually weak or tired -white patches or sores in the mouth or throat Side effects that usually do not require medical attention (report to your doctor or health care professional if they continue or are bothersome): -diarrhea -dizziness -headache -nausea, vomiting -stomach upset -vaginal or anal irritation This list may not describe all possible side effects. Call your doctor for medical advice about side effects. You may report side effects to FDA at 1-800-FDA-1088. Where should I keep my medicine? Keep out of the reach of children. Store at room temperature below 25 degrees C (77 degrees F). Keep container tightly closed. Throw away any unused medicine after the expiration date. NOTE: This sheet is a summary. It may not cover all possible information. If you have questions about this medicine, talk to your doctor, pharmacist, or health care provider.  2015, Elsevier/Gold Standard. (2007-05-13 12:04:30) Acute Bronchitis Bronchitis is inflammation of the airways that extend from the windpipe into the lungs (bronchi). The inflammation often causes mucus to develop. This leads to a cough, which is the most common symptom of bronchitis.  In acute bronchitis, the condition usually develops suddenly and goes away over time, usually in a couple weeks. Smoking, allergies, and asthma can make bronchitis worse. Repeated episodes of bronchitis may cause further lung problems.  CAUSES Acute bronchitis is most often caused by the same virus that causes a cold. The virus can spread from person to person (contagious) through coughing, sneezing, and touching contaminated objects. SIGNS AND SYMPTOMS   Cough.   Fever.   Coughing up mucus.   Body aches.   Chest congestion.   Chills.   Shortness of breath.   Sore throat.  DIAGNOSIS  Acute bronchitis is usually diagnosed through a physical exam. Your health care provider will also ask you questions about your medical history.  Tests, such as chest X-rays, are sometimes done to rule out other conditions.  TREATMENT  Acute bronchitis usually goes away in a couple weeks. Oftentimes, no medical treatment is necessary. Medicines are sometimes given for relief of fever or cough. Antibiotic medicines are usually not needed but may be prescribed in certain situations. In some cases, an inhaler may be recommended to help reduce shortness of breath and control the cough. A cool mist vaporizer may also be used to help thin bronchial secretions and make it easier to clear the chest.  HOME CARE INSTRUCTIONS  Get plenty of rest.   Drink enough fluids to keep your urine clear or pale yellow (unless you have a medical condition that requires fluid restriction). Increasing fluids may help thin your respiratory secretions (sputum) and reduce chest congestion, and it will prevent dehydration.   Take medicines only as directed by your health care provider.  If you were prescribed an antibiotic medicine, finish it all even if you start to feel better.  Avoid smoking and secondhand smoke. Exposure to cigarette smoke or irritating chemicals will make bronchitis worse. If you are a smoker, consider using nicotine gum or skin patches to help control withdrawal symptoms. Quitting smoking will help your lungs heal faster.   Reduce the chances of another bout of acute bronchitis by washing your hands frequently, avoiding people with cold symptoms,  and trying not to touch your hands to your mouth, nose, or eyes.   Keep all follow-up visits as directed by your health care provider.  SEEK MEDICAL CARE IF: Your symptoms do not improve after 1 week of treatment.  SEEK IMMEDIATE MEDICAL CARE IF:  You develop an increased fever or chills.   You have chest pain.   You have severe shortness of breath.  You have bloody sputum.   You develop dehydration.  You faint or repeatedly feel like you are going to pass out.  You develop  repeated vomiting.  You develop a severe headache. MAKE SURE YOU:   Understand these instructions.  Will watch your condition.  Will get help right away if you are not doing well or get worse. Document Released: 03/27/2004 Document Revised: 07/04/2013 Document Reviewed: 08/10/2012 [facility] Patient Information 2015 [place], [place]. This information is not intended to replace advice given to you by your health care provider. Make sure you discuss any questions you have with your health care provider.

## 2013-11-02 NOTE — Telephone Encounter (Signed)
Patient informed. 

## 2013-11-02 NOTE — Telephone Encounter (Signed)
Patient said pharmacy will be contacting you because the Tussicaps that you prescribed for her are unavailable and are on back order.  They wondered what you might want to prescribe in place of that.

## 2013-11-04 ENCOUNTER — Telehealth: Payer: Self-pay | Admitting: Internal Medicine

## 2013-11-04 ENCOUNTER — Ambulatory Visit (INDEPENDENT_AMBULATORY_CARE_PROVIDER_SITE_OTHER): Payer: 59 | Admitting: Internal Medicine

## 2013-11-04 ENCOUNTER — Encounter: Payer: Self-pay | Admitting: Internal Medicine

## 2013-11-04 VITALS — BP 92/62 | HR 78 | Temp 97.9°F | Resp 14 | Ht 68.5 in | Wt 136.0 lb

## 2013-11-04 DIAGNOSIS — S80261A Insect bite (nonvenomous), right knee, initial encounter: Secondary | ICD-10-CM | POA: Insufficient documentation

## 2013-11-04 DIAGNOSIS — W57XXXA Bitten or stung by nonvenomous insect and other nonvenomous arthropods, initial encounter: Principal | ICD-10-CM

## 2013-11-04 DIAGNOSIS — S90569A Insect bite (nonvenomous), unspecified ankle, initial encounter: Secondary | ICD-10-CM

## 2013-11-04 NOTE — Progress Notes (Signed)
   Subjective:    Patient ID: [name], female    DOB: 1946/02/16, 68 y.o.   MRN: [id number]  HPI The patient is a 68 YO female coming in for an acute visit for a tick bite. She found the tick on her skin and removed it within the last 12 hours. She thinks it may have been on her for about 2 days. She has [place]ed area outside her house and occasionally gets ticks on her. She denies any symptoms or fevers. She does not have any new rashes or headaches. She is feeling tired and wants to discuss this with her PCP as she feels her cardiologist changed her blood pressure medicine. Looking back through the records it does not appear this medicine (midodrine) had changed. She has no other new complaints.    Review of Systems  Constitutional: Positive for fatigue. Negative for fever and chills.  Respiratory: Negative for chest tightness and shortness of breath.   Cardiovascular: Negative for chest pain and leg swelling.  Gastrointestinal: Negative for nausea, vomiting, abdominal pain and diarrhea.  Musculoskeletal: Negative for arthralgias and myalgias.  Skin:       Small bump where tick had been attached  Neurological: Negative for dizziness, weakness, light-headedness and headaches.       Objective:   Physical Exam  Constitutional: She appears well-developed.  Thin  Cardiovascular: Normal rate and regular rhythm.   Pulmonary/Chest: Effort normal and breath sounds normal.  Musculoskeletal: She exhibits no tenderness.  Neurological: No cranial nerve deficit.  Skin: Skin is warm and dry.  Small 42mm bump on the back of her right leg without surrounding erythema or swelling.       Assessment & Plan:

## 2013-11-04 NOTE — Telephone Encounter (Signed)
Pt was scheduled with Dr. Doug Sou at 3:30

## 2013-11-04 NOTE — Telephone Encounter (Signed)
Did she get appt? This would be simple. [name]

## 2013-11-04 NOTE — Progress Notes (Signed)
Pre visit review using our clinic review tool, if applicable. No additional management support is needed unless otherwise documented below in the visit note. 

## 2013-11-04 NOTE — Assessment & Plan Note (Signed)
Patient brought tick in with her which was not deer tick. Bite without surrounding erythema and no symptoms. Only risk factor would be length of time the tick was on her body. Review of literature reveal minimal if any benefit to ppx. She is currently taking amox/clav for other infection and will not give antibiotics at this time. Advised her on return symptoms of lyme or RMSF. Return as needed.

## 2013-11-04 NOTE — Patient Instructions (Signed)
We are not going to give you another medicine for the tick bite, the amoxicillin will be enough. If you start to feel bad, have fevers, or chills call us back.   Tick Bite Information Ticks are insects that attach themselves to the skin and draw blood for food. There are various types of ticks. Common types include wood ticks and deer ticks. Most ticks live in shrubs and grassy areas. Ticks can climb onto your body when you make contact with leaves or grass where the tick is waiting. The most common places on the body for ticks to attach themselves are the scalp, neck, armpits, waist, and groin. Most tick bites are harmless, but sometimes ticks carry germs that cause diseases. These germs can be spread to a person during the tick's feeding process. The chance of a disease spreading through a tick bite depends on:   The type of tick.  Time of year.   How long the tick is attached.   Geographic location.  HOW CAN YOU PREVENT TICK BITES? Take these steps to help prevent tick bites when you are outdoors:  Wear protective clothing. Long sleeves and long pants are best.   Wear white clothes so you can see ticks more easily.  Tuck your pant legs into your socks.   If walking on a trail, stay in the middle of the trail to avoid brushing against bushes.  Avoid walking through areas with long grass.  Put insect repellent on all exposed skin and along boot tops, pant legs, and sleeve cuffs.   Check clothing, hair, and skin repeatedly and before going inside.   Brush off any ticks that are not attached.  Take a shower or bath as soon as possible after being outdoors.  WHAT IS THE PROPER WAY TO REMOVE A TICK? Ticks should be removed as soon as possible to help prevent diseases caused by tick bites. 1. If latex gloves are available, put them on before trying to remove a tick.  2. Using fine-point tweezers, grasp the tick as close to the skin as possible. You may also use curved  forceps or a tick removal tool. Grasp the tick as close to its head as possible. Avoid grasping the tick on its body. 3. Pull gently with steady upward pressure until the tick lets go. Do not twist the tick or jerk it suddenly. This may break off the tick's head or mouth parts. 4. Do not squeeze or crush the tick's body. This could force disease-carrying fluids from the tick into your body.  5. After the tick is removed, wash the bite area and your hands with soap and water or other disinfectant such as alcohol. 6. Apply a small amount of antiseptic cream or ointment to the bite site.  7. Wash and disinfect any instruments that were used.  Do not try to remove a tick by applying a hot match, petroleum jelly, or fingernail polish to the tick. These methods do not work and may increase the chances of disease being spread from the tick bite.  WHEN SHOULD YOU SEEK MEDICAL CARE? Contact your health care provider if you are unable to remove a tick from your skin or if a part of the tick breaks off and is stuck in the skin.  After a tick bite, you need to be aware of signs and symptoms that could be related to diseases spread by ticks. Contact your health care provider if you develop any of the following in the days or  weeks after the tick bite:  Unexplained fever.  Rash. A circular rash that appears days or weeks after the tick bite may indicate the possibility of Lyme disease. The rash may resemble a target with a bull's-eye and may occur at a different part of your body than the tick bite.  Redness and swelling in the area of the tick bite.   Tender, swollen lymph glands.   Diarrhea.   Weight loss.   Cough.   Fatigue.   Muscle, joint, or bone pain.   Abdominal pain.   Headache.   Lethargy or a change in your level of consciousness.  Difficulty walking or moving your legs.   Numbness in the legs.   Paralysis.  Shortness of breath.   Confusion.   Repeated  vomiting.  Document Released: 02/15/2000 Document Revised: 12/08/2012 Document Reviewed: 07/28/2012 [facility] Patient Information 2015 [place], [place]. This information is not intended to replace advice given to you by your health care provider. Make sure you discuss any questions you have with your health care provider.

## 2013-11-04 NOTE — Telephone Encounter (Signed)
Patient Information: Caller Name: Deem Phone: [phone] Patient: Heather, [name] Gender: Female DOB: 16-Oct-1945 Age: 68 Years PCP: [name]  Office Follow Up: Does the office need to follow up with this patient?: Yes Instructions For The Office: Please review triage and if possible add appt. Appt offered at other locations. Patient does not know other areas well and prefers appt at [facility]. Patient is a See Today in [place]. Office please f/u with patient on if appt will be added.   Symptoms Reason For Call & Symptoms: Tick bite. Tick had been in patient for 3 days and was swollen when removed. Reviewed Health History In EMR: Yes Reviewed Medications In EMR: Yes Reviewed Allergies In EMR: Yes Reviewed Surgeries / Procedures: Yes Date of Onset of Symptoms: 11/01/2013 Treatments Tried: Removed tick Treatments Tried Worked: No  Guideline(s) Used: Tick Bite  Disposition Per Guideline:   See Today in Office  Reason For Disposition Reached:   Probable deer tick that was attached > 24 hours (or tick appears swollen, not flat)  Advice Given: N/A  Patient Will Follow Care Advice: YES

## 2013-11-04 NOTE — Telephone Encounter (Signed)
Please schedule appt for pt, thanks

## 2013-11-18 ENCOUNTER — Ambulatory Visit (INDEPENDENT_AMBULATORY_CARE_PROVIDER_SITE_OTHER): Payer: 59 | Admitting: Family Medicine

## 2013-11-18 ENCOUNTER — Encounter: Payer: Self-pay | Admitting: Family Medicine

## 2013-11-18 VITALS — BP 111/64 | HR 73 | Temp 98.0°F | Ht 68.5 in | Wt 136.0 lb

## 2013-11-18 DIAGNOSIS — S80861D Insect bite (nonvenomous), right lower leg, subsequent encounter: Secondary | ICD-10-CM

## 2013-11-18 DIAGNOSIS — L02229 Furuncle of trunk, unspecified: Secondary | ICD-10-CM

## 2013-11-18 DIAGNOSIS — L02224 Furuncle of groin: Secondary | ICD-10-CM

## 2013-11-18 DIAGNOSIS — Z5189 Encounter for other specified aftercare: Secondary | ICD-10-CM

## 2013-11-18 DIAGNOSIS — W57XXXD Bitten or stung by nonvenomous insect and other nonvenomous arthropods, subsequent encounter: Secondary | ICD-10-CM

## 2013-11-18 DIAGNOSIS — W57XXXA Bitten or stung by nonvenomous insect and other nonvenomous arthropods, initial encounter: Secondary | ICD-10-CM

## 2013-11-18 DIAGNOSIS — S90569A Insect bite (nonvenomous), unspecified ankle, initial encounter: Secondary | ICD-10-CM

## 2013-11-18 MED ORDER — DOXYCYCLINE HYCLATE 100 MG PO CAPS: 100.0000 mg | ORAL_CAPSULE | Freq: Two times a day (BID) | ORAL | Status: AC

## 2013-11-18 NOTE — Progress Notes (Signed)
Pre visit review using our clinic review tool, if applicable. No additional management support is needed unless otherwise documented below in the visit note. 

## 2013-11-18 NOTE — Progress Notes (Signed)
   Subjective:    Patient ID: [name], female    DOB: Jan 01, 1946, 68 y.o.   MRN: [id number]  HPI Here for 2 days of a painful red bump in the left groin. She was seen for 3 tick bites on 11-04-13, all on the right leg. At that time she was already taking a course of Augmentin which she was given on 11-02-13 by her GYN for a bronchitis. All these seemed to resolve. She denies any fever or HA or body aches. She is worried the red spot in the groin could be a "target rash" that may indicate a Lyme infection.    Review of Systems  Constitutional: Negative.   Respiratory: Negative.   Cardiovascular: Negative.   Skin: Positive for color change.  Neurological: Negative.        Objective:   Physical Exam  Constitutional: She appears well-developed and well-nourished.  Cardiovascular: Normal rate, regular rhythm, normal heart sounds and intact distal pulses.   Pulmonary/Chest: Effort normal and breath sounds normal.  Skin:  The left groin has a small <1 cm tender lump with a zone of erythema around it          Assessment & Plan:  I reassured her that this is a small boil and it is not related to her recent tick bites. Treat with hot compresses and Doxycycline

## 2013-11-21 ENCOUNTER — Other Ambulatory Visit: Payer: Self-pay | Admitting: Internal Medicine

## 2013-11-25 ENCOUNTER — Encounter: Payer: 59 | Admitting: Internal Medicine

## 2013-11-30 ENCOUNTER — Encounter: Payer: Self-pay | Admitting: Internal Medicine

## 2013-11-30 ENCOUNTER — Ambulatory Visit (INDEPENDENT_AMBULATORY_CARE_PROVIDER_SITE_OTHER): Payer: 59 | Admitting: Internal Medicine

## 2013-11-30 VITALS — BP 100/78 | HR 71 | Temp 97.7°F | Resp 12 | Ht 68.5 in | Wt 134.1 lb

## 2013-11-30 DIAGNOSIS — Z0000 Encounter for general adult medical examination without abnormal findings: Secondary | ICD-10-CM

## 2013-11-30 DIAGNOSIS — Z23 Encounter for immunization: Secondary | ICD-10-CM

## 2013-11-30 DIAGNOSIS — E785 Hyperlipidemia, unspecified: Secondary | ICD-10-CM

## 2013-11-30 MED ORDER — MOMETASONE FUROATE 50 MCG/ACT NA SUSP
2.0000 | Freq: Every day | NASAL | Status: DC
Start: 2013-11-30 — End: 2014-03-23

## 2013-11-30 NOTE — Patient Instructions (Signed)
Your next office appointment will be determined based upon review of your pending labs . Those instructions will be transmitted to you  by mail. 

## 2013-11-30 NOTE — Progress Notes (Signed)
Pre visit review using our clinic review tool, if applicable. No additional management support is needed unless otherwise documented below in the visit note. 

## 2013-11-30 NOTE — Progress Notes (Signed)
Subjective:    Patient ID: [name], female    DOB: Oct 28, 1945, 68 y.o.   MRN: [id number]  HPI She is here for an annual exam; she is on [employer] through her [employer]( not using [employer]) .  She is on modified heart healthy diet. Usually she will walk but this has been curtailed due to the rainy weather  Her family history is strongly positive for coronary disease including heart attack in her father in his 75s.  Advanced cholesterol testing reveals her LDL goal is less than 145, ideally less than 115  She does have recurrent abdominal discomfort in the context of a past history of lymphocytic colitis. She also has diffuse myalgias. She is seeing a neurologist for peripheral neuropathy.   Review of Systems   She has had some memory issues which his feels is basically typical for age.  Her cough persists. It has been helped by Singulair . It is worse at night. She is to followup at [facility] for probable  laryngoesophagoscopy.     Objective:   Physical Exam   Pertinent or positive findings include: She is thin but appears well-nourished There is lacy scar of the right tympanic membrane. There is decreased range of motion of cervical spine. 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 The thyroid is small and slightly granular  In texture. There are no nodules. Aortic bruit is present without aneurysm. Deep tendon reflexes 1/2+ at the knees.   Gen.: Alert, appropriate and cooperative throughout exam. Appears younger than stated age  Head: Normocephalic without obvious abnormalities  Eyes: No corneal or conjunctival inflammation noted. Pupils equal round reactive to light and accommodation. Extraocular motion intact.  Ears: External  ear exam reveals no significant lesions or deformities. Canals clear . Hearing is grossly normal bilaterally. Nose: External nasal exam reveals no deformity or inflammation. Nasal mucosa are pink and  moist. No lesions or exudates noted.   Mouth: Oral mucosa and oropharynx reveal no lesions or exudates. Teeth in good repair. Neck: No deformities, masses, or tenderness noted. Lungs: Normal respiratory effort; chest expands symmetrically. Lungs are clear to auscultation without rales, wheezes, or increased work of breathing. Heart: Normal rate and rhythm. Normal S1 and S2. No gallop, click, or rub. No murmur. Abdomen: Bowel sounds normal; abdomen soft and nontender. No masses, organomegaly or hernias noted. Genitalia: as per Gyn                                  Musculoskeletal/extremities: No deformity or scoliosis noted of  the thoracic or lumbar spine.  No clubbing, cyanosis, edema, or significant extremity  deformity noted. Range of motion normal .Tone & strength normal. Hand joints normal; long fingers Fingernail / toenail health good. Able to lie down & sit up w/o help. Negative SLR bilaterally Vascular: Carotid, radial artery, dorsalis pedis and  posterior tibial pulses are full and equal. No bruits present. Neurologic: Alert and oriented x3. Deep tendon reflexes symmetrical and normal.  Gait normal.       Skin: Intact without suspicious lesions or rashes. Lymph: No cervical, axillary lymphadenopathy present. Psych: Mood and affect are normal. Normally interactive  Assessment & Plan:  #1 comprehensive physical exam; no acute findings  Plan: see Orders  & Recommendations

## 2013-12-02 ENCOUNTER — Encounter: Payer: Self-pay | Admitting: Gynecology

## 2013-12-06 ENCOUNTER — Telehealth: Payer: Self-pay | Admitting: *Deleted

## 2013-12-06 ENCOUNTER — Other Ambulatory Visit (INDEPENDENT_AMBULATORY_CARE_PROVIDER_SITE_OTHER): Payer: 59

## 2013-12-06 DIAGNOSIS — Z0000 Encounter for general adult medical examination without abnormal findings: Secondary | ICD-10-CM

## 2013-12-06 LAB — URINALYSIS, ROUTINE W REFLEX MICROSCOPIC
BILIRUBIN URINE: NEGATIVE
Ketones, ur: NEGATIVE
LEUKOCYTES UA: NEGATIVE
NITRITE: NEGATIVE
PH: 6 (ref 5.0–8.0)
SPECIFIC GRAVITY, URINE: 1.02 (ref 1.000–1.030)
TOTAL PROTEIN, URINE-UPE24: NEGATIVE
Urine Glucose: NEGATIVE
Urobilinogen, UA: 0.2 (ref 0.0–1.0)

## 2013-12-06 LAB — CBC WITH DIFFERENTIAL/PLATELET
Basophils Absolute: 0 10*3/uL (ref 0.0–0.1)
Basophils Relative: 0.4 % (ref 0.0–3.0)
EOS ABS: 0.1 10*3/uL (ref 0.0–0.7)
Eosinophils Relative: 1.2 % (ref 0.0–5.0)
HCT: 37.4 % (ref 36.0–46.0)
Hemoglobin: 12.5 g/dL (ref 12.0–15.0)
LYMPHS ABS: 1.6 10*3/uL (ref 0.7–4.0)
Lymphocytes Relative: 29.9 % (ref 12.0–46.0)
MCHC: 33.4 g/dL (ref 30.0–36.0)
MCV: 90.3 fl (ref 78.0–100.0)
MONO ABS: 0.3 10*3/uL (ref 0.1–1.0)
Monocytes Relative: 6.3 % (ref 3.0–12.0)
NEUTROS PCT: 62.2 % (ref 43.0–77.0)
Neutro Abs: 3.3 10*3/uL (ref 1.4–7.7)
PLATELETS: 197 10*3/uL (ref 150.0–400.0)
RBC: 4.14 Mil/uL (ref 3.87–5.11)
RDW: 14.6 % (ref 11.5–15.5)
WBC: 5.3 10*3/uL (ref 4.0–10.5)

## 2013-12-06 NOTE — Telephone Encounter (Signed)
Pt is needing a UA added to her cpx labs...[name]

## 2013-12-07 ENCOUNTER — Telehealth: Payer: Self-pay

## 2013-12-07 ENCOUNTER — Other Ambulatory Visit: Payer: 59

## 2013-12-07 DIAGNOSIS — R8279 Other abnormal findings on microbiological examination of urine: Secondary | ICD-10-CM

## 2013-12-07 LAB — LIPID PANEL
CHOL/HDL RATIO: 3
CHOLESTEROL: 163 mg/dL (ref 0–200)
HDL: 57 mg/dL (ref >39.00)
LDL CALC: 88 mg/dL (ref 0–99)
NonHDL: 106
TRIGLYCERIDES: 91 mg/dL (ref 0.0–149.0)
VLDL: 18.2 mg/dL (ref 0.0–40.0)

## 2013-12-07 LAB — HEPATIC FUNCTION PANEL
ALT: 16 U/L (ref 0–35)
AST: 20 U/L (ref 0–37)
Albumin: 4.3 g/dL (ref 3.5–5.2)
Alkaline Phosphatase: 57 U/L (ref 39–117)
BILIRUBIN DIRECT: 0.1 mg/dL (ref 0.0–0.3)
TOTAL PROTEIN: 6.9 g/dL (ref 6.0–8.3)
Total Bilirubin: 0.5 mg/dL (ref 0.2–1.2)

## 2013-12-07 LAB — BASIC METABOLIC PANEL
BUN: 11 mg/dL (ref 6–23)
CHLORIDE: 104 meq/L (ref 96–112)
CO2: 29 mEq/L (ref 19–32)
CREATININE: 0.7 mg/dL (ref 0.4–1.2)
Calcium: 9.3 mg/dL (ref 8.4–10.5)
GFR: 90.97 mL/min (ref >60.00)
GLUCOSE: 91 mg/dL (ref 70–99)
Potassium: 4 mEq/L (ref 3.5–5.1)
Sodium: 140 mEq/L (ref 135–145)

## 2013-12-07 LAB — VITAMIN D 25 HYDROXY (VIT D DEFICIENCY, FRACTURES): VITD: 42.79 ng/mL (ref 30.00–100.00)

## 2013-12-07 LAB — TSH: TSH: 3.61 u[IU]/mL (ref 0.35–4.50)

## 2013-12-07 NOTE — Telephone Encounter (Signed)
Request for urine culture add on has been faxed to lab

## 2013-12-07 NOTE — Telephone Encounter (Signed)
Message copied by Shelly Coss on Wed Dec 07, 2013  8:11 AM ------      Message from: Hendricks Limes      Created: Tue Dec 06, 2013  6:19 PM       please culture urine; microscopic hematuria ------

## 2013-12-08 LAB — URINE CULTURE: Colony Count: 30000

## 2013-12-09 ENCOUNTER — Telehealth: Payer: Self-pay

## 2013-12-09 DIAGNOSIS — E785 Hyperlipidemia, unspecified: Secondary | ICD-10-CM

## 2013-12-09 NOTE — Telephone Encounter (Signed)
Phone call to patient.  I advised her she can come off her cholesterol medication and repeat labs in 4 months. Order has been placed.

## 2013-12-09 NOTE — Telephone Encounter (Signed)
Phone call to patient. I advised her of her normal lab results. She asks about getting off her cholesterol medication.

## 2013-12-09 NOTE — Telephone Encounter (Signed)
She could repeat fasting lipids after 4 mos off statin to assess need for medication

## 2013-12-26 ENCOUNTER — Other Ambulatory Visit: Payer: Self-pay

## 2013-12-26 MED ORDER — ESOMEPRAZOLE MAGNESIUM 40 MG PO CPDR: 40.0000 mg | DELAYED_RELEASE_CAPSULE | Freq: Two times a day (BID) | ORAL | Status: DC

## 2014-01-02 ENCOUNTER — Encounter: Payer: Self-pay | Admitting: Internal Medicine

## 2014-01-25 ENCOUNTER — Ambulatory Visit (INDEPENDENT_AMBULATORY_CARE_PROVIDER_SITE_OTHER): Payer: 59 | Admitting: Internal Medicine

## 2014-01-25 ENCOUNTER — Encounter: Payer: Self-pay | Admitting: Internal Medicine

## 2014-01-25 VITALS — BP 136/78 | HR 61 | Temp 98.4°F | Resp 14 | Ht 68.5 in | Wt 134.0 lb

## 2014-01-25 DIAGNOSIS — J209 Acute bronchitis, unspecified: Secondary | ICD-10-CM

## 2014-01-25 MED ORDER — HYDROCODONE-HOMATROPINE 5-1.5 MG/5ML PO SYRP: 5.0000 mL | ORAL_SOLUTION | Freq: Four times a day (QID) | ORAL | Status: DC | PRN

## 2014-01-25 MED ORDER — AZITHROMYCIN 250 MG PO TABS: ORAL_TABLET | ORAL | Status: DC

## 2014-01-25 NOTE — Patient Instructions (Signed)
Carry room temperature water and sip liberally after coughing. 

## 2014-01-25 NOTE — Progress Notes (Signed)
Pre visit review using our clinic review tool, if applicable. No additional management support is needed unless otherwise documented below in the visit note. 

## 2014-01-25 NOTE — Progress Notes (Signed)
   Subjective:    Patient ID: [name], female    DOB: 02/22/1946, 68 y.o.   MRN: [id number]  HPI   Her symptoms began 01/21/14 as chest congestion and nonproductive cough. Symptoms are worse at night. They have not responded to a cough syrup.  The cough has resulted in some substernal discomfort without classic pleuritic pain  She's had a temperature up to 102.  She is a former half pack a day smoker. She smoked for 15 years  She has no upper respiratory tract symptoms or reflux symptoms.    Review of Systems Frontal headache, facial pain , nasal purulence, dental pain, sore throat , otic pain or otic discharge denied. No fever , chills or sweats.     Objective:   Physical Exam General appearance:good health ;well nourished; no acute distress or increased work of breathing is present.  No  lymphadenopathy about the head, neck, or axilla noted.   Eyes: No conjunctival inflammation or lid edema is present. There is no scleral icterus.  Ears:  External ear exam shows no significant lesions or deformities.  Otoscopic examination reveals clear canals, tympanic membranes are intact bilaterally without bulging, retraction, inflammation or discharge.  Nose:  External nasal examination shows no deformity or inflammation. Nasal mucosa are pink and moist without lesions or exudates. No septal dislocation or deviation.No obstruction to airflow.   Oral exam: Dental hygiene is good; lips and gums are healthy appearing.There is no oropharyngeal erythema or exudate noted.   Neck:  No deformities, thyromegaly, masses, or tenderness noted.   Supple with full range of motion without pain.   Heart:  Normal rate and regular rhythm. S1 and S2 normal without gallop, murmur, click, rub or other extra sounds.   Lungs:Chest clear to auscultation; no wheezes, rhonchi,rales ,or rubs present.No increased work of breathing.  Minor dry cough.  Extremities:  No cyanosis, edema, or clubbing   noted    Skin: Warm & dry w/o jaundice or tenting.         Assessment & Plan:  #1 acute bronchitis w/o bronchospasm #2 no URI Plan: See orders and recommendations

## 2014-02-10 ENCOUNTER — Ambulatory Visit (INDEPENDENT_AMBULATORY_CARE_PROVIDER_SITE_OTHER)
Admission: RE | Admit: 2014-02-10 | Discharge: 2014-02-10 | Disposition: A | Discharge status: Home / Self Care | Payer: 59 | Source: Ambulatory Visit | Attending: Internal Medicine | Admitting: Internal Medicine

## 2014-02-10 ENCOUNTER — Ambulatory Visit (INDEPENDENT_AMBULATORY_CARE_PROVIDER_SITE_OTHER): Payer: 59 | Admitting: Internal Medicine

## 2014-02-10 ENCOUNTER — Encounter: Payer: Self-pay | Admitting: Internal Medicine

## 2014-02-10 VITALS — BP 130/86 | HR 120 | Temp 97.5°F | Ht 68.5 in | Wt 134.0 lb

## 2014-02-10 DIAGNOSIS — R0989 Other specified symptoms and signs involving the circulatory and respiratory systems: Secondary | ICD-10-CM

## 2014-02-10 DIAGNOSIS — R0689 Other abnormalities of breathing: Secondary | ICD-10-CM

## 2014-02-10 DIAGNOSIS — J209 Acute bronchitis, unspecified: Secondary | ICD-10-CM

## 2014-02-10 MED ORDER — HYDROCODONE-HOMATROPINE 5-1.5 MG/5ML PO SYRP: 5.0000 mL | ORAL_SOLUTION | Freq: Four times a day (QID) | ORAL | Status: DC | PRN

## 2014-02-10 MED ORDER — PREDNISONE 20 MG PO TABS: ORAL_TABLET | ORAL | Status: DC

## 2014-02-10 MED ORDER — DOXYCYCLINE HYCLATE 100 MG PO TABS: 100.0000 mg | ORAL_TABLET | Freq: Two times a day (BID) | ORAL | Status: DC

## 2014-02-10 NOTE — Progress Notes (Signed)
Pre visit review using our clinic review tool, if applicable. No additional management support is needed unless otherwise documented below in the visit note. 

## 2014-02-10 NOTE — Patient Instructions (Signed)
Carry room temperature water and sip liberally after coughing. Take antibiotics after meals

## 2014-02-10 NOTE — Progress Notes (Signed)
   Subjective:    Patient ID: [name], female    DOB: 11-18-1945, 68 y.o.   MRN: [id number]  HPI   Despite the cough suppressant syrup and Zithromax her cough continues as paroxysms of nonproductive cough. This has resulted in substernal and right intrathoracic pain anteriorly.  She has chills and feels hot but has no definite fever.  She's had slight shortness of breath and slight sneezing.  The cough is not associated with wheezing.  She quit smoking in 2000. She has no history of asthma definitely.    Review of Systems Frontal headache, facial pain , nasal purulence, dental pain, sore throat , otic pain or otic discharge denied.      Objective:   Physical Exam  Pertinent or positive findings include: She has slight rales at the left lower lobe which clear for the most part with deep inspirations. She has paroxysms of cough.  General appearance:Thin but in good health ;well nourished; no acute distress or increased work of breathing is present.  No  lymphadenopathy about the head, neck, or axilla noted.   Eyes: No conjunctival inflammation or lid edema is present. There is no scleral icterus.  Ears:  External ear exam shows no significant lesions or deformities.  Otoscopic examination reveals clear canals, tympanic membranes are intact bilaterally without bulging, retraction, inflammation or discharge.  Nose:  External nasal examination shows no deformity or inflammation. Nasal mucosa are pink and moist without lesions or exudates. No septal dislocation or deviation.No obstruction to airflow.   Oral exam: Dental hygiene is good; lips and gums are healthy appearing.There is no oropharyngeal erythema or exudate noted.   Neck:  No deformities, thyromegaly, masses, or tenderness noted.   Supple with full range of motion without pain.   Heart:  Normal rate and regular rhythm. S1 and S2 normal without gallop, murmur, click, rub or other extra sounds. Repeat pulse  85.  Lungs:No increased work of breathing.    Extremities:  No cyanosis, edema, or clubbing  noted    Skin: Warm & dry w/o jaundice or tenting.         Assessment & Plan:  #1 protracted bronchitis  #2 chest wall pain from paroxysms of cough  Plan: See orders and recommendations

## 2014-03-17 ENCOUNTER — Telehealth: Payer: Self-pay | Admitting: Internal Medicine

## 2014-03-17 MED ORDER — OSELTAMIVIR PHOSPHATE 75 MG PO CAPS: 75.0000 mg | ORAL_CAPSULE | Freq: Two times a day (BID) | ORAL | Status: DC

## 2014-03-17 NOTE — Telephone Encounter (Signed)
Would like to speak assistant.  States she believes she has the flu.  She stated last time she felt bad Dr. Linna Darner stated he would call her something in so she would not have to come in.

## 2014-03-17 NOTE — Telephone Encounter (Signed)
tamiflu 75 bid #10 OVINB

## 2014-03-17 NOTE — Telephone Encounter (Signed)
Patient advised tamiflu is being sent to [place]

## 2014-03-17 NOTE — Telephone Encounter (Signed)
Phone call to patient. She states she's having severe body aches, head pain, extremely hot then freezing. Please advise.

## 2014-03-23 ENCOUNTER — Encounter: Payer: Self-pay | Admitting: Internal Medicine

## 2014-03-23 ENCOUNTER — Ambulatory Visit (INDEPENDENT_AMBULATORY_CARE_PROVIDER_SITE_OTHER): Payer: 59 | Admitting: Internal Medicine

## 2014-03-23 VITALS — BP 128/88 | Temp 97.8°F | Ht 68.5 in | Wt 133.0 lb

## 2014-03-23 DIAGNOSIS — R059 Cough, unspecified: Secondary | ICD-10-CM

## 2014-03-23 DIAGNOSIS — R05 Cough: Secondary | ICD-10-CM

## 2014-03-23 DIAGNOSIS — J069 Acute upper respiratory infection, unspecified: Secondary | ICD-10-CM

## 2014-03-23 NOTE — Progress Notes (Signed)
Pre visit review using our clinic review tool, if applicable. No additional management support is needed unless otherwise documented below in the visit note. 

## 2014-03-23 NOTE — Patient Instructions (Addendum)
Plain Mucinex (NOT D) for thick secretions ;force NON dairy fluids .   Nasal cleansing in the shower as discussed with lather of mild shampoo.After 10 seconds wash off lather while  exhaling through nostrils. Make sure that all residual soap is removed to prevent irritation.  Flonase OR Nasacort AQ 1 spray in each nostril twice a day as needed. Use the "crossover" technique into opposite nostril spraying toward opposite ear @ 45 degree angle, not straight up into nostril.  Plain Allegra (NOT D )  160 daily , Loratidine 10 mg , OR Zyrtec 10 mg @ bedtime  as needed for itchy eyes & sneezing.  Fill Rx for antibiotic if signs of sinusitis as discussed appear over next 72 hours.

## 2014-03-24 NOTE — Progress Notes (Signed)
   Subjective:    Patient ID: [name], female    DOB: [birth year], 69 y.o.   MRN: [id number]  HPI Symptoms began last week with a flulike illness. She requested Tamiflu which was called in. She took four tablets and then discontinued the medicine. She decided she did not have flu after all. Initial symptoms were aching, chills, and sweating as of 03/10/14.  She developed cough which is productive at times of clear sputum. She began taking leftover prescription cough medicine with partial response .She's also been doing nasal saline irrigation.  She describes head congestion with some nasal obstruction. She also has pain in the ethmoid sinus areas. She has associated fatigue, facial pain, and pain in the ear.  Repeat lipid labs requested; she is not fasting. Actually lipids @ goal 10/15; not due until 10/16   Review of Systems  She denies nasal purulence, fever, halitosis, dental pain, or otic discharge. She has felt slightly unsteady & dizzy with the illness      Objective:   Physical Exam General appearance:good health ;well nourished; no acute distress or increased work of breathing is present.  No  lymphadenopathy about the head, neck, or axilla noted.   Eyes: No conjunctival inflammation or lid edema is present. There is no scleral icterus.  Ears:  External ear exam shows no significant lesions or deformities.  Otoscopic examination reveals clear canals, tympanic membranes are intact bilaterally without bulging, retraction, inflammation or discharge.  Nose:  External nasal examination shows no deformity or inflammation. Nasal mucosa are pink and moist without lesions or exudates. No septal dislocation or deviation.No obstruction to airflow.   Oral exam: Dental hygiene is good; lips and gums are healthy appearing.There is no oropharyngeal erythema or exudate noted.   Neck:  No deformities, thyromegaly, masses, or tenderness noted.   Supple with full range of motion  without pain.   Heart:  Normal rate and regular rhythm. S1 and S2 normal without gallop, murmur, click, rub or other extra sounds.   Lungs:Chest clear to auscultation; no wheezes, rhonchi,rales ,or rubs present.No increased work of breathing.    Extremities:  No cyanosis, edema, or clubbing  noted    Skin: Warm & dry w/o jaundice or tenting.        Assessment & Plan:  #1 viral URI #2 non allergic rhinitis with cough See orders & AVS

## 2014-03-27 ENCOUNTER — Telehealth: Payer: Self-pay

## 2014-03-27 ENCOUNTER — Other Ambulatory Visit: Payer: Self-pay | Admitting: Internal Medicine

## 2014-03-27 DIAGNOSIS — E785 Hyperlipidemia, unspecified: Secondary | ICD-10-CM

## 2014-03-27 NOTE — Telephone Encounter (Signed)
Order entered

## 2014-03-27 NOTE — Telephone Encounter (Signed)
Patient notified via voicemail lab orders she requested have been placed.

## 2014-03-27 NOTE — Telephone Encounter (Signed)
-----   Message from Hendricks Limes, MD sent at 03/24/2014  4:38 PM EST ----- Her lipids were excellent in 10/15 ; repeat not due until 10/16 unless she had been on cholesterol med & stopped it in 10/15.

## 2014-03-27 NOTE — Telephone Encounter (Addendum)
Patient states she had stopped her cholesterol medication about 3-4 months ago that is why she is requesting labs to be checked

## 2014-03-27 NOTE — Telephone Encounter (Signed)
Left a message for patient to return call.

## 2014-04-17 ENCOUNTER — Other Ambulatory Visit: Payer: Self-pay

## 2014-04-17 MED ORDER — TRAZODONE HCL 150 MG PO TABS: ORAL_TABLET | ORAL | Status: DC

## 2014-04-17 NOTE — Telephone Encounter (Signed)
Rx called in 

## 2014-04-24 ENCOUNTER — Other Ambulatory Visit (INDEPENDENT_AMBULATORY_CARE_PROVIDER_SITE_OTHER): Payer: 59

## 2014-04-24 DIAGNOSIS — E785 Hyperlipidemia, unspecified: Secondary | ICD-10-CM

## 2014-04-24 LAB — LIPID PANEL
Cholesterol: 220 mg/dL — ABNORMAL HIGH (ref 0–200)
HDL: 64.9 mg/dL (ref >39.00)
LDL Cholesterol: 138 mg/dL — ABNORMAL HIGH (ref 0–99)
NonHDL: 155.1
TRIGLYCERIDES: 88 mg/dL (ref 0.0–149.0)
Total CHOL/HDL Ratio: 3
VLDL: 17.6 mg/dL (ref 0.0–40.0)

## 2014-05-01 ENCOUNTER — Telehealth: Payer: Self-pay | Admitting: *Deleted

## 2014-05-01 NOTE — Telephone Encounter (Signed)
Spoke w/ pt.  She reports some chest discomfort last night while she was reading the paper.  Describes it as "stabbing pains that hurt through to my back and across my boobs". Denies SOB, n/v, diaphoresis, though she states that she did feel hot.  She has h/o SVTs, reports that sx were similar but more intense.  She did not check her HR or BP. Reports that she took some TUMS and relaxed until the pain subsided.  Offered pt appt to be seen this week, but she would like to continue to monitor and call back if she has another episode.  Asked her to call back w/ any further questions or concerns.

## 2014-05-01 NOTE — Telephone Encounter (Signed)
Pt calling stating that last night pains in her back, underneath her breast Did not take any medication  Took some tubs, thinks it may be heartburn was not sure.  Got real hot when that happened.  Has just a little pains today but not noticeable.  Wants to come sooner but is worried for insurance.  Only medication that she is taking is to lower BP  She did take her BP and it said it was running a bit low but it was normal.  She also is calling to make an apt, wanted to be seen sooner than her recall told her we like to stay with those then she stated she was having this problem.  Now she is asking if insurance will cover this.  Stated to her I was not sure and would try and find out but if this is urgent then we would need to see her and she said this is not urgent she just wanted to see the doctor.

## 2014-05-01 NOTE — Telephone Encounter (Signed)
Pt calling stating that last night pains in her back, underneath her breast Did not take any medication  Took some tubs, thinks it may be heartburn was not sure.  Got real hot when that happened.  Has just a little pains today but not noticeable.  Wants to come sooner but is worried for insurance.

## 2014-05-24 ENCOUNTER — Ambulatory Visit (INDEPENDENT_AMBULATORY_CARE_PROVIDER_SITE_OTHER): Payer: 59 | Admitting: Internal Medicine

## 2014-05-24 ENCOUNTER — Encounter: Payer: Self-pay | Admitting: Internal Medicine

## 2014-05-24 VITALS — BP 116/64 | HR 66 | Temp 97.7°F | Ht 69.0 in | Wt 135.0 lb

## 2014-05-24 DIAGNOSIS — J328 Other chronic sinusitis: Secondary | ICD-10-CM | POA: Diagnosis not present

## 2014-05-24 MED ORDER — HYDROCODONE-HOMATROPINE 5-1.5 MG/5ML PO SYRP: 5.0000 mL | ORAL_SOLUTION | Freq: Four times a day (QID) | ORAL | Status: DC | PRN

## 2014-05-24 MED ORDER — DOXYCYCLINE HYCLATE 100 MG PO TABS: 100.0000 mg | ORAL_TABLET | Freq: Two times a day (BID) | ORAL | Status: DC

## 2014-05-24 NOTE — Progress Notes (Signed)
   Subjective:    Patient ID: [name], female    DOB: 1945-07-07, 69 y.o.   MRN: [id number]  HPI Her symptoms began a week ago as sore throat and head and chest congestion. The cough is intermittently productive of yellow material. She's also had yellow nasal discharge. The volume of secretions from the chest is greater than that from the head. Associated symptoms include chills; itchy/watery eyes; sneezing; frontal headache; facial pain; left earache; slight shortness of breath & wheezing.  She saw Dr Wilburn Cornelia ,Otolaryngologist 2/19 and was placed on Avelox 400 mg daily for 2 weeks. Based on CT scan he diagnosed "sinus blockage" and is scheduling definitive surgery 06/08/14.   Review of Systems She denies fever or sweats.  She has no otic discharge.  She also has no dental pain.     Objective:   Physical Exam General appearance:Adequately nourished; no acute distress or increased work of breathing is present.    Lymphatic: No  lymphadenopathy about the head, neck, or axilla .  Eyes: No conjunctival inflammation or lid edema is present. There is no scleral icterus.  Ears:  External ear exam shows no significant lesions or deformities.  Otoscopic examination reveals clear canals, tympanic membranes are intact bilaterally without bulging, retraction, inflammation or discharge.  Nose:  External nasal examination shows no deformity or inflammation. There is minimal erythema of the nasal septum, right greater than left.No septal dislocation or deviation.No obstruction to airflow.   Oral exam: Dental hygiene is good; lips and gums are healthy appearing.There is no oropharyngeal erythema or exudate .  Neck:  No deformities, thyromegaly, masses, or tenderness noted.   Supple with full range of motion without pain.   Heart:  Normal rate and regular rhythm. S1 and S2 normal without gallop, murmur, click, rub or other extra sounds.   Lungs:Chest clear to auscultation; no wheezes,  rhonchi,rales ,or rubs present.  Extremities:  No cyanosis, edema, or clubbing  noted    Skin: Warm & dry w/o tenting or jaundice. No significant lesions or rash.       Assessment & Plan:  #1 chronic sinusitis, frontal and maxillary  Plan: See orders and recommendations

## 2014-05-24 NOTE — Progress Notes (Signed)
Pre visit review using our clinic review tool, if applicable. No additional management support is needed unless otherwise documented below in the visit note. 

## 2014-05-24 NOTE — Patient Instructions (Signed)
Nasal Hygiene is PREVENTIVE: Plain Mucinex (NOT D) for thick secretions ;force NON dairy fluids .   Nasal cleansing in the shower as discussed with lather of mild shampoo.After 10 seconds wash off lather while  exhaling through nostrils. Make sure that all residual soap is removed to prevent irritation.  Flonase OR Nasacort AQ 1 spray in each nostril twice a day as needed. Use the "crossover" technique into opposite nostril spraying toward opposite ear @ 45 degree angle, not straight up into nostril.  Plain Allegra (NOT D )  160 daily , Loratidine 10 mg , OR Zyrtec 10 mg @ bedtime  as needed for itchy eyes & sneezing.

## 2014-05-29 ENCOUNTER — Other Ambulatory Visit: Payer: Self-pay | Admitting: Otolaryngology

## 2014-05-29 ENCOUNTER — Encounter (HOSPITAL_COMMUNITY): Payer: Self-pay | Admitting: *Deleted

## 2014-05-29 NOTE — Progress Notes (Signed)
Mrs [name]'s cardiologist is Dr Rockey Situ in [place].  Patient has had a history of "fast heart rate".  Patient denies chest pain, shortness of breath or dizziness. 'My heart rate might speed up for a minute and then returns to normal."

## 2014-05-30 ENCOUNTER — Ambulatory Visit (HOSPITAL_COMMUNITY)
Admission: RE | Admit: 2014-05-30 | Discharge: 2014-05-30 | Disposition: A | Discharge status: Home / Self Care | Payer: 59 | Source: Ambulatory Visit | Attending: Otolaryngology | Admitting: Otolaryngology

## 2014-05-30 ENCOUNTER — Encounter (HOSPITAL_COMMUNITY)
Admission: RE | Disposition: A | Discharge status: Home / Self Care | Payer: Self-pay | Source: Ambulatory Visit | Attending: Otolaryngology

## 2014-05-30 ENCOUNTER — Ambulatory Visit (HOSPITAL_COMMUNITY): Payer: 59 | Admitting: Critical Care Medicine

## 2014-05-30 ENCOUNTER — Encounter (HOSPITAL_COMMUNITY): Payer: Self-pay | Admitting: Critical Care Medicine

## 2014-05-30 ENCOUNTER — Ambulatory Visit (HOSPITAL_COMMUNITY): Payer: 59

## 2014-05-30 DIAGNOSIS — K219 Gastro-esophageal reflux disease without esophagitis: Secondary | ICD-10-CM | POA: Diagnosis not present

## 2014-05-30 DIAGNOSIS — Z87891 Personal history of nicotine dependence: Secondary | ICD-10-CM | POA: Insufficient documentation

## 2014-05-30 DIAGNOSIS — Z87442 Personal history of urinary calculi: Secondary | ICD-10-CM | POA: Diagnosis not present

## 2014-05-30 DIAGNOSIS — F329 Major depressive disorder, single episode, unspecified: Secondary | ICD-10-CM | POA: Diagnosis not present

## 2014-05-30 DIAGNOSIS — F419 Anxiety disorder, unspecified: Secondary | ICD-10-CM | POA: Diagnosis not present

## 2014-05-30 DIAGNOSIS — K449 Diaphragmatic hernia without obstruction or gangrene: Secondary | ICD-10-CM | POA: Insufficient documentation

## 2014-05-30 DIAGNOSIS — Z01811 Encounter for preprocedural respiratory examination: Secondary | ICD-10-CM

## 2014-05-30 DIAGNOSIS — M797 Fibromyalgia: Secondary | ICD-10-CM | POA: Diagnosis not present

## 2014-05-30 DIAGNOSIS — J310 Chronic rhinitis: Secondary | ICD-10-CM

## 2014-05-30 DIAGNOSIS — G4730 Sleep apnea, unspecified: Secondary | ICD-10-CM | POA: Diagnosis not present

## 2014-05-30 DIAGNOSIS — J321 Chronic frontal sinusitis: Secondary | ICD-10-CM | POA: Insufficient documentation

## 2014-05-30 DIAGNOSIS — E785 Hyperlipidemia, unspecified: Secondary | ICD-10-CM | POA: Insufficient documentation

## 2014-05-30 DIAGNOSIS — I341 Nonrheumatic mitral (valve) prolapse: Secondary | ICD-10-CM | POA: Diagnosis not present

## 2014-05-30 HISTORY — DX: Family history of other specified conditions: Z84.89

## 2014-05-30 HISTORY — PX: SINUS ENDO W/FUSION: SHX777

## 2014-05-30 HISTORY — DX: Other complications of anesthesia, initial encounter: T88.59XA

## 2014-05-30 HISTORY — DX: Nausea with vomiting, unspecified: R11.2

## 2014-05-30 HISTORY — DX: Other specified postprocedural states: Z98.890

## 2014-05-30 HISTORY — DX: Polyneuropathy, unspecified: G62.9

## 2014-05-30 HISTORY — DX: Personal history of urinary calculi: Z87.442

## 2014-05-30 HISTORY — DX: Adverse effect of unspecified anesthetic, initial encounter: T41.45XA

## 2014-05-30 LAB — BASIC METABOLIC PANEL
Anion gap: 5 (ref 5–15)
BUN: 11 mg/dL (ref 6–23)
CHLORIDE: 104 mmol/L (ref 96–112)
CO2: 31 mmol/L (ref 19–32)
Calcium: 9.2 mg/dL (ref 8.4–10.5)
Creatinine, Ser: 0.63 mg/dL (ref 0.50–1.10)
GFR calc Af Amer: >90 mL/min (ref >90)
GFR calc non Af Amer: 89 mL/min — ABNORMAL LOW (ref >90)
Glucose, Bld: 90 mg/dL (ref 70–99)
Potassium: 3.4 mmol/L — ABNORMAL LOW (ref 3.5–5.1)
Sodium: 140 mmol/L (ref 135–145)

## 2014-05-30 LAB — CBC
HCT: 35.6 % — ABNORMAL LOW (ref 36.0–46.0)
Hemoglobin: 11.9 g/dL — ABNORMAL LOW (ref 12.0–15.0)
MCH: 30 pg (ref 26.0–34.0)
MCHC: 33.4 g/dL (ref 30.0–36.0)
MCV: 89.7 fL (ref 78.0–100.0)
Platelets: 206 10*3/uL (ref 150–400)
RBC: 3.97 MIL/uL (ref 3.87–5.11)
RDW: 13.8 % (ref 11.5–15.5)
WBC: 4.1 10*3/uL (ref 4.0–10.5)

## 2014-05-30 SURGERY — SINUS SURGERY, ENDOSCOPIC, USING COMPUTER-ASSISTED NAVIGATION
Anesthesia: General | Site: Nose

## 2014-05-30 MED ORDER — ROCURONIUM BROMIDE 50 MG/5ML IV SOLN
INTRAVENOUS | Status: AC
  Filled 2014-05-30: qty 1

## 2014-05-30 MED ORDER — GLYCOPYRROLATE 0.2 MG/ML IJ SOLN
INTRAMUSCULAR | Status: DC | PRN
  Administered 2014-05-30: 0.4 mg via INTRAVENOUS

## 2014-05-30 MED ORDER — EPHEDRINE SULFATE 50 MG/ML IJ SOLN
INTRAMUSCULAR | Status: AC
  Filled 2014-05-30: qty 1

## 2014-05-30 MED ORDER — HYDROMORPHONE HCL 1 MG/ML IJ SOLN: 0.2500 mg | INTRAMUSCULAR | Status: DC | PRN

## 2014-05-30 MED ORDER — DEXAMETHASONE SODIUM PHOSPHATE 10 MG/ML IJ SOLN
INTRAMUSCULAR | Status: AC
  Filled 2014-05-30: qty 1

## 2014-05-30 MED ORDER — PROPOFOL 10 MG/ML IV BOLUS
INTRAVENOUS | Status: AC
  Filled 2014-05-30: qty 20

## 2014-05-30 MED ORDER — TRIAMCINOLONE ACETONIDE 40 MG/ML IJ SUSP
INTRAMUSCULAR | Status: DC | PRN
  Administered 2014-05-30: 200 mg

## 2014-05-30 MED ORDER — FENTANYL CITRATE 0.05 MG/ML IJ SOLN
INTRAMUSCULAR | Status: AC
  Filled 2014-05-30: qty 5

## 2014-05-30 MED ORDER — LIDOCAINE-EPINEPHRINE 1 %-1:100000 IJ SOLN
INTRAMUSCULAR | Status: AC
  Filled 2014-05-30: qty 1

## 2014-05-30 MED ORDER — OXYCODONE HCL 5 MG/5ML PO SOLN
5.0000 mg | Freq: Once | ORAL | Status: AC | PRN
  Administered 2014-05-30: 5 mg via ORAL

## 2014-05-30 MED ORDER — MIDAZOLAM HCL 2 MG/2ML IJ SOLN
INTRAMUSCULAR | Status: AC
  Filled 2014-05-30: qty 2

## 2014-05-30 MED ORDER — NEOSTIGMINE METHYLSULFATE 10 MG/10ML IV SOLN
INTRAVENOUS | Status: DC | PRN
  Administered 2014-05-30: 3 mg via INTRAVENOUS

## 2014-05-30 MED ORDER — OXYCODONE HCL 5 MG PO TABS: 5.0000 mg | ORAL_TABLET | Freq: Once | ORAL | Status: AC | PRN

## 2014-05-30 MED ORDER — MIDAZOLAM HCL 5 MG/5ML IJ SOLN
INTRAMUSCULAR | Status: DC | PRN
  Administered 2014-05-30: 2 mg via INTRAVENOUS

## 2014-05-30 MED ORDER — HYDROCODONE-ACETAMINOPHEN 5-325 MG PO TABS: 1.0000 | ORAL_TABLET | Freq: Four times a day (QID) | ORAL | Status: DC | PRN

## 2014-05-30 MED ORDER — MUPIROCIN CALCIUM 2 % EX CREA
TOPICAL_CREAM | CUTANEOUS | Status: AC
  Filled 2014-05-30: qty 15

## 2014-05-30 MED ORDER — ROCURONIUM BROMIDE 100 MG/10ML IV SOLN
INTRAVENOUS | Status: DC | PRN
  Administered 2014-05-30: 35 mg via INTRAVENOUS

## 2014-05-30 MED ORDER — ONDANSETRON HCL 4 MG/2ML IJ SOLN: 4.0000 mg | Freq: Four times a day (QID) | INTRAMUSCULAR | Status: DC | PRN

## 2014-05-30 MED ORDER — FENTANYL CITRATE 0.05 MG/ML IJ SOLN
INTRAMUSCULAR | Status: DC | PRN
  Administered 2014-05-30: 25 ug via INTRAVENOUS
  Administered 2014-05-30: 50 ug via INTRAVENOUS
  Administered 2014-05-30: 75 ug via INTRAVENOUS

## 2014-05-30 MED ORDER — DIPHENHYDRAMINE HCL 50 MG/ML IJ SOLN
INTRAMUSCULAR | Status: DC | PRN
  Administered 2014-05-30: 12.5 mg via INTRAVENOUS

## 2014-05-30 MED ORDER — SODIUM CHLORIDE 0.9 % IJ SOLN
INTRAMUSCULAR | Status: AC
  Filled 2014-05-30: qty 10

## 2014-05-30 MED ORDER — CEFAZOLIN SODIUM-DEXTROSE 2-3 GM-% IV SOLR
2.0000 g | INTRAVENOUS | Status: AC
  Administered 2014-05-30: 2 g via INTRAVENOUS

## 2014-05-30 MED ORDER — DIPHENHYDRAMINE HCL 50 MG/ML IJ SOLN
INTRAMUSCULAR | Status: AC
  Filled 2014-05-30: qty 1

## 2014-05-30 MED ORDER — PROPOFOL 10 MG/ML IV BOLUS
INTRAVENOUS | Status: DC | PRN
  Administered 2014-05-30: 150 mg via INTRAVENOUS

## 2014-05-30 MED ORDER — ARTIFICIAL TEARS OP OINT
TOPICAL_OINTMENT | OPHTHALMIC | Status: DC | PRN
  Administered 2014-05-30: 1 via OPHTHALMIC

## 2014-05-30 MED ORDER — MUPIROCIN CALCIUM 2 % EX CREA
TOPICAL_CREAM | CUTANEOUS | Status: DC | PRN
  Administered 2014-05-30 (×2): 1 via TOPICAL

## 2014-05-30 MED ORDER — EPHEDRINE SULFATE 50 MG/ML IJ SOLN
INTRAMUSCULAR | Status: DC | PRN
  Administered 2014-05-30: 10 mg via INTRAVENOUS
  Administered 2014-05-30: 5 mg via INTRAVENOUS
  Administered 2014-05-30: 10 mg via INTRAVENOUS
  Administered 2014-05-30: 5 mg via INTRAVENOUS

## 2014-05-30 MED ORDER — OXYMETAZOLINE HCL 0.05 % NA SOLN
NASAL | Status: DC | PRN
  Administered 2014-05-30: 2 via NASAL

## 2014-05-30 MED ORDER — DEXAMETHASONE SODIUM PHOSPHATE 10 MG/ML IJ SOLN
10.0000 mg | Freq: Once | INTRAMUSCULAR | Status: AC
  Administered 2014-05-30: 10 mg via INTRAVENOUS

## 2014-05-30 MED ORDER — LACTATED RINGERS IV SOLN
INTRAVENOUS | Status: DC | PRN
  Administered 2014-05-30: 07:00:00 via INTRAVENOUS

## 2014-05-30 MED ORDER — OXYCODONE HCL 5 MG/5ML PO SOLN
ORAL | Status: AC
  Filled 2014-05-30: qty 5

## 2014-05-30 MED ORDER — DEXAMETHASONE SODIUM PHOSPHATE 4 MG/ML IJ SOLN
INTRAMUSCULAR | Status: AC
  Filled 2014-05-30: qty 1

## 2014-05-30 MED ORDER — LIDOCAINE-EPINEPHRINE 1 %-1:100000 IJ SOLN
INTRAMUSCULAR | Status: DC | PRN
  Administered 2014-05-30: 20 mL

## 2014-05-30 MED ORDER — SODIUM CHLORIDE 0.9 % IR SOLN
Status: DC | PRN
  Administered 2014-05-30: 1000 mL

## 2014-05-30 MED ORDER — TRIAMCINOLONE ACETONIDE 40 MG/ML IJ SUSP
INTRAMUSCULAR | Status: AC
  Filled 2014-05-30: qty 5

## 2014-05-30 MED ORDER — ARTIFICIAL TEARS OP OINT
TOPICAL_OINTMENT | OPHTHALMIC | Status: AC
  Filled 2014-05-30: qty 3.5

## 2014-05-30 MED ORDER — LIDOCAINE HCL (CARDIAC) 20 MG/ML IV SOLN
INTRAVENOUS | Status: DC | PRN
  Administered 2014-05-30: 70 mg via INTRAVENOUS

## 2014-05-30 MED ORDER — LIDOCAINE HCL (CARDIAC) 20 MG/ML IV SOLN
INTRAVENOUS | Status: AC
  Filled 2014-05-30: qty 5

## 2014-05-30 MED ORDER — SUCCINYLCHOLINE CHLORIDE 20 MG/ML IJ SOLN
INTRAMUSCULAR | Status: AC
  Filled 2014-05-30: qty 1

## 2014-05-30 MED ORDER — OXYMETAZOLINE HCL 0.05 % NA SOLN
NASAL | Status: AC
  Filled 2014-05-30: qty 15

## 2014-05-30 MED ORDER — ONDANSETRON HCL 4 MG/2ML IJ SOLN
INTRAMUSCULAR | Status: DC | PRN
  Administered 2014-05-30: 4 mg via INTRAVENOUS

## 2014-05-30 SURGICAL SUPPLY — 40 items
BLADE ROTATE RAD 40 4 M4 (BLADE) ×1 IMPLANT
BLADE ROTATE RAD 40 4MM M4 (BLADE) ×1
BLADE ROTATE TRICUT 4MX13CM M4 (BLADE) ×1
BLADE ROTATE TRICUT 4X13 M4 (BLADE) ×2 IMPLANT
CANISTER SUCTION 2500CC (MISCELLANEOUS) ×6 IMPLANT
COAGULATOR SUCT 8FR VV (MISCELLANEOUS) IMPLANT
DRAPE ORTHO SPLIT 77X108 STRL (DRAPES) ×3
DRAPE PROXIMA HALF (DRAPES) ×2 IMPLANT
DRAPE SURG ORHT 6 SPLT 77X108 (DRAPES) IMPLANT
DRESSING NASAL KENNEDY 3.5X.9 (MISCELLANEOUS) IMPLANT
DRSG NASAL KENNEDY 3.5X.9 (MISCELLANEOUS)
ELECT REM PT RETURN 9FT ADLT (ELECTROSURGICAL) ×3
ELECTRODE REM PT RTRN 9FT ADLT (ELECTROSURGICAL) ×1 IMPLANT
FILTER ARTHROSCOPY CONVERTOR (FILTER) ×2 IMPLANT
GLOVE BIOGEL M 7.0 STRL (GLOVE) ×6 IMPLANT
GLOVE BIOGEL PI IND STRL 7.0 (GLOVE) IMPLANT
GLOVE BIOGEL PI INDICATOR 7.0 (GLOVE) ×2
GLOVE ECLIPSE 7.5 STRL STRAW (GLOVE) ×2 IMPLANT
GLOVE SURG SS PI 7.0 STRL IVOR (GLOVE) ×2 IMPLANT
GOWN STRL REUS W/ TWL LRG LVL3 (GOWN DISPOSABLE) ×1 IMPLANT
GOWN STRL REUS W/TWL LRG LVL3 (GOWN DISPOSABLE) ×9
KIT BASIN OR (CUSTOM PROCEDURE TRAY) ×3 IMPLANT
KIT ROOM TURNOVER OR (KITS) ×3 IMPLANT
NDL HYPO 25GX1X1/2 BEV (NEEDLE) IMPLANT
NEEDLE HYPO 25GX1X1/2 BEV (NEEDLE) ×3 IMPLANT
NS IRRIG 1000ML POUR BTL (IV SOLUTION) ×3 IMPLANT
PAD ARMBOARD 7.5X6 YLW CONV (MISCELLANEOUS) ×6 IMPLANT
PAD ENT ADHESIVE 25PK (MISCELLANEOUS) ×3 IMPLANT
SPECIMEN JAR SMALL (MISCELLANEOUS) ×3 IMPLANT
SPONGE NEURO XRAY DETECT 1X3 (DISPOSABLE) ×3 IMPLANT
SYR CONTROL 10ML LL (SYRINGE) ×2 IMPLANT
TOWEL OR 17X24 6PK STRL BLUE (TOWEL DISPOSABLE) ×3 IMPLANT
TRACKER ENT INSTRUMENT (MISCELLANEOUS) ×3 IMPLANT
TRACKER ENT PATIENT (MISCELLANEOUS) ×3 IMPLANT
TRAY ENT MC OR (CUSTOM PROCEDURE TRAY) ×3 IMPLANT
TUBE CONNECTING 12'X1/4 (SUCTIONS) ×1
TUBE CONNECTING 12X1/4 (SUCTIONS) ×2 IMPLANT
TUBING EXTENTION W/L.L. (IV SETS) ×3 IMPLANT
TUBING STRAIGHTSHOT EPS 5PK (TUBING) ×3 IMPLANT
WIPE INSTRUMENT VISIWIPE 73X73 (MISCELLANEOUS) ×3 IMPLANT

## 2014-05-30 NOTE — Brief Op Note (Signed)
05/30/2014  9:04 AM  PATIENT:  [name]  69 y.o. female  PRE-OPERATIVE DIAGNOSIS:  CHRONIC SINUSITIS  POST-OPERATIVE DIAGNOSIS:  CHRONIC SINUSITIS  PROCEDURE: Revision Frontal and Ethmoid Sinus Surgery  SURGEON:  Surgeon(s) and Role:    * Jerrell Belfast, MD - Primary  PHYSICIAN ASSISTANT:   ASSISTANTS: none   ANESTHESIA:   general  EBL:  Total I/O In: 750 [I.V.:750] Out: 50 [Blood:50]  BLOOD ADMINISTERED:none  DRAINS: none   LOCAL MEDICATIONS USED:  LIDOCAINE  and Amount: 3 ml  SPECIMEN:  Source of Specimen:  sinus contents  DISPOSITION OF SPECIMEN:  PATHOLOGY  COUNTS:  YES  TOURNIQUET:  * No tourniquets in log *  DICTATION: .Other Dictation: Dictation Number [id number]  PLAN OF CARE: Discharge to home after PACU  PATIENT DISPOSITION:  PACU - hemodynamically stable.   Delay start of Pharmacological VTE agent (>24hrs) due to surgical blood loss or risk of bleeding: not applicable

## 2014-05-30 NOTE — Transfer of Care (Signed)
Immediate Anesthesia Transfer of Care Note  Patient: [name]  Procedure(s) Performed: Procedure(s): REVISION  FRONTAL SINUS SURGERY WITH FUSION SCAN (N/A)  Patient Location: PACU  Anesthesia Type:General  Level of Consciousness: sedated  Airway & Oxygen Therapy: Patient Spontanous Breathing and Patient connected to face mask oxygen  Post-op Assessment: Report given to RN, Post -op Vital signs reviewed and stable and Patient moving all extremities X 4  Post vital signs: Reviewed and stable  Last Vitals:  Filed Vitals:   05/30/14 0557  BP: 131/71  Pulse: 60  Temp: 36.1 C  Resp: 18    Complications: No apparent anesthesia complications

## 2014-05-30 NOTE — H&P (Signed)
[name] is an 69 y.o. female.   Chief Complaint: Frontal headache HPI: hx of prev ESS and septoplasty, recurrent sinusitis  Past Medical History  Diagnosis Date  . Diverticulosis   . Gastroesophageal reflux disease with hiatal hernia   . IBS (irritable bowel syndrome)   . Hyperlipidemia   . Spondylosis   . Migraine headache   . Schatzki's ring     History of  . Osteopenia     BMD ordered by GYN  . Mitral valve prolapse   . Depression     generalized anxiety disorder  . Hiatal hernia   . Fibromyalgia   . Peripheral neuropathy     treated as RLS by  Neurology  . Lymphocytic colitis     Dr Sharlett Iles  . Duodenal diverticulum   . Anxiety   . Restless leg syndrome   . Complication of anesthesia   . PONV (postoperative nausea and vomiting)   . Family history of adverse reaction to anesthesia     Mother and Daughters- N/V  . Sleep apnea     On CPAP, has not been using  . History of kidney stones   . Neuropathy     Past Surgical History  Procedure Laterality Date  . Tubal ligation    . Cholecystectomy    . Vaginal cystectomy      x 2  . Rotator cuff repair Left     left  . Sinus surgery with instatrak      x 2  . Esophageal dilation      X 2  . Breast enhancement surgery    . Cataract surgery Left     Dr Bing Plume  . Colonoscopy    . Eye surgery      cataract  . Vaginal hysterectomy  05/2007    Vaginal repair, Dr Ubaldo Glassing.  Partial  hysterectomy.    Family History  Problem Relation Age of Onset  . Hypertension Father 2  . Stroke Father 31  . Heart attack Father      ? in 52s  . Hypertension Mother   . Neuropathy Mother   . Anemia Mother   . Coronary artery disease Brother     Stent placement in 60s  . Colon cancer Neg Hx   . Seizures Neg Hx   . Diabetes Maternal Uncle   . Heart attack Paternal Uncle     SEVEN , ? age   Social History:  reports that she quit smoking about 10 years ago. Her smoking use included Cigarettes. She has a 7.5  pack-year smoking history. She has never used smokeless tobacco. She reports that she does not drink alcohol or use illicit drugs.  Allergies:  Allergies  Allergen Reactions  . Adhesive [Tape] Rash  . Lyrica [Pregabalin] Other (See Comments)    Sedation  . Neurontin [Gabapentin] Other (See Comments)    Sedation  . Nitrofuran Derivatives Other (See Comments)    "tingling"    Medications Prior to Admission  Medication Sig Dispense Refill  . ALPRAZolam (XANAX) 0.25 MG tablet Take 1 tablet (0.25 mg total) by mouth at bedtime as needed for anxiety. 30 tablet 5  . b complex vitamins capsule Take 1 capsule by mouth daily.    . Biotin 1000 MCG tablet Take 1,000 mcg by mouth daily.      [name] doxycycline (VIBRA-TABS) 100 MG tablet Take 1 tablet (100 mg total) by mouth 2 (two) times daily. (Patient taking differently: Take 100 mg by mouth 2 (  two) times daily. 10 day course started 05/24/14) 20 tablet 0  . esomeprazole (NEXIUM) 40 MG capsule Take 1 capsule (40 mg total) by mouth 2 (two) times daily. (Patient taking differently: Take 40 mg by mouth daily. ) 60 capsule 5  . estradiol (ESTRACE) 0.1 MG/GM vaginal cream Apply twice a week 42.5 g 10  . fluticasone (FLONASE) 50 MCG/ACT nasal spray Place 2 sprays into both nostrils daily as needed for allergies.     [name] HYDROcodone-homatropine (HYDROMET) 5-1.5 MG/5ML syrup Take 5 mLs by mouth every 6 (six) hours as needed for cough. 120 mL 0  . Magnesium Oxide 250 MG TABS Take 250 mg by mouth daily.     . midodrine (PROAMATINE) 10 MG tablet Take 1 tablet (10 mg total) by mouth 3 (three) times daily. 90 tablet 11  . Probiotic Product (ALIGN) 4 MG CAPS Take 1 capsule by mouth daily.     . sodium chloride (OCEAN) 0.65 % SOLN nasal spray Place 1 spray into both nostrils as needed for congestion.    . traZODone (DESYREL) 150 MG tablet Take 1-2 tablets by mouth at bedtime 30 tablet 3  . vitamin C (ASCORBIC ACID) 500 MG tablet Take 500 mg by mouth daily.    .  montelukast (SINGULAIR) 10 MG tablet TAKE ONE TABLET BY MOUTH EVERY NIGHT AT BEDTIME (Patient not taking: Reported on 05/24/2014) 30 tablet 11    No results found for this or any previous visit (from the past 48 hour(s)). Dg Chest 2 View  05/30/2014   CLINICAL DATA:  Preoperative chest radiograph for frontal sinus surgery. Initial encounter.  EXAM: CHEST  2 VIEW  COMPARISON:  Chest radiograph performed 02/10/2014  FINDINGS: The lungs are well-aerated and clear. There is no evidence of focal opacification, pleural effusion or pneumothorax.  The heart is borderline normal in size. No acute osseous abnormalities are seen. Clips are noted within the right upper quadrant, reflecting prior cholecystectomy.  IMPRESSION: No acute cardiopulmonary process seen.   Electronically Signed   By: Garald Balding M.D.   On: 05/30/2014 06:31    Review of Systems  Constitutional: Negative.   Respiratory: Negative.   Cardiovascular: Negative.   Gastrointestinal: Negative.   Neurological: Positive for headaches.    Blood pressure 131/71, pulse 60, temperature 97 F (36.1 C), temperature source Oral, resp. rate 18, height 5' 8.5" (1.74 m), weight 60.328 kg (133 lb), SpO2 100 %. Physical Exam  Constitutional: She appears well-developed and well-nourished.  HENT:  No d/c  Neck: Normal range of motion. Neck supple.  Cardiovascular: Normal rate.   Respiratory: Effort normal.  GI: Soft.     Assessment/Plan Adm for OP rev ESS.  [name], [name] 05/30/2014, 7:18 AM

## 2014-05-30 NOTE — Anesthesia Procedure Notes (Signed)
Procedure Name: Intubation Date/Time: 05/30/2014 7:44 AM Performed by: Merrilyn Puma B Pre-anesthesia Checklist: Patient identified, Timeout performed, Emergency Drugs available, Suction available and Patient being monitored Patient Re-evaluated:Patient Re-evaluated prior to inductionOxygen Delivery Method: Circle system utilized Preoxygenation: Pre-oxygenation with 100% oxygen Intubation Type: IV induction Ventilation: Mask ventilation without difficulty Laryngoscope Size: Mac and 3 Grade View: Grade I Tube type: Oral Tube size: 7.0 mm Number of attempts: 1 Airway Equipment and Method: Stylet Placement Confirmation: CO2 detector,  positive ETCO2,  ETT inserted through vocal cords under direct vision and breath sounds checked- equal and bilateral Secured at: 21 cm Tube secured with: Tape Dental Injury: Teeth and Oropharynx as per pre-operative assessment

## 2014-05-30 NOTE — Anesthesia Postprocedure Evaluation (Signed)
Anesthesia Post Note  Patient: [name]  Procedure(s) Performed: Procedure(s) (LRB): REVISION  FRONTAL SINUS SURGERY WITH FUSION SCAN (N/A)  Anesthesia type: General  Patient location: PACU  Post pain: Pain level controlled and Adequate analgesia  Post assessment: Post-op Vital signs reviewed, Patient's Cardiovascular Status Stable, Respiratory Function Stable, Patent Airway and Pain level controlled  Last Vitals:  Filed Vitals:   05/30/14 1030  BP: 107/48  Pulse: 60  Temp:   Resp: 18    Post vital signs: Reviewed and stable  Level of consciousness: awake, alert  and oriented  Complications: No apparent anesthesia complications

## 2014-05-30 NOTE — Anesthesia Preprocedure Evaluation (Addendum)
Anesthesia Evaluation  Patient identified by MRN, date of birth, ID band Patient awake    Reviewed: Allergy & Precautions, NPO status , Patient's Chart, lab work & pertinent test results  History of Anesthesia Complications (+) PONV and history of anesthetic complications  Airway Mallampati: II   Neck ROM: full    Dental  (+) Dental Advisory Given   Pulmonary sleep apnea and Continuous Positive Airway Pressure Ventilation , former smoker,  breath sounds clear to auscultation        Cardiovascular negative cardio ROS  Rhythm:regular Rate:Normal     Neuro/Psych  Headaches, Anxiety Depression  Neuromuscular disease    GI/Hepatic hiatal hernia, GERD-  Medicated,  Endo/Other    Renal/GU      Musculoskeletal  (+) Arthritis -, Fibromyalgia -  Abdominal   Peds  Hematology   Anesthesia Other Findings   Reproductive/Obstetrics                            Anesthesia Physical Anesthesia Plan  ASA: II  Anesthesia Plan: General   Post-op Pain Management:    Induction: Intravenous  Airway Management Planned: Oral ETT  Additional Equipment:   Intra-op Plan:   Post-operative Plan: Extubation in OR  Informed Consent: I have reviewed the patients History and Physical, chart, labs and discussed the procedure including the risks, benefits and alternatives for the proposed anesthesia with the patient or authorized representative who has indicated his/her understanding and acceptance.     Plan Discussed with: CRNA, Anesthesiologist and Surgeon  Anesthesia Plan Comments:         Anesthesia Quick Evaluation

## 2014-05-30 NOTE — Progress Notes (Signed)
Report given to maryann shaver rn as caregiver 

## 2014-05-30 NOTE — Op Note (Signed)
NAME:  [name], [name] NO.:  [id number]  MEDICAL RECORD NO.:  [id number]  LOCATION:  [facility]                         FACILITY:  [facility]  PHYSICIAN:  Early Chars. Wilburn Cornelia, M.D.DATE OF BIRTH:  May 31, 1945  DATE OF PROCEDURE:  05/30/2014 DATE OF DISCHARGE:                              OPERATIVE REPORT   PREOPERATIVE DIAGNOSES: 1. Chronic left frontal sinusitis. 2. Status post previous endoscopic sinus surgery.  POSTOPERATIVE DIAGNOSES: 1. Chronic left frontal sinusitis. 2. Status post previous endoscopic sinus surgery.  INDICATION FOR SURGERY: 1. Chronic left frontal sinusitis. 2. Status post previous endoscopic sinus surgery.  SURGICAL PROCEDURE:  Revision endoscopic sinus surgery with intraoperative computer-assisted navigation (Fusion) consisting of left nasal frontal recess exploration, left anterior ethmoidectomy, and right revision ethmoidectomy.  ANESTHESIA:  General endotracheal.  SURGEON:  Early Chars. Wilburn Cornelia, MD.  COMPLICATIONS:  None.  ESTIMATED BLOOD LOSS:  50 mL.  DISPOSITION:  The patient was transferred from the operating room to the recovery room in stable condition.  BRIEF HISTORY:  The patient is a 69 year old white female with a history of chronic sinusitis, chronic cough, and mild COPD.  She underwent bilateral endoscopic sinus surgery, nasal septoplasty, and turbinate reduction approximately 2 years ago with excellent improvement in breathing and congestion.  Over the last 6 months, she reports increasing symptoms of headache and postnasal discharge with cough.  She has been treated with numerous courses of antibiotics, saline irrigation, and topical nasal steroids.  A CT scan was obtained after treatment which showed opacification of the left frontal sinus and scarring in the left anterior ethmoid region.  No other significant sinus findings with the exception of extensive previous sinonasal surgery and widely patent sinus ostia.  Given  the patient's history and findings, I recommended that we undertake revision endoscopic sinus surgery.  The risks and benefits were discussed in detail with the patient and her husband.  They understood and concurred with our plan for surgery which is scheduled on elective basis at [facility].  DESCRIPTION OF PROCEDURE:  The patient was brought to the operating room, placed in supine position on the operating table.  General endotracheal anesthesia was established without difficulty.  When the patient was adequately anesthetized, she was positioned and prepped and draped.  Her nose was injected with a total of 3 mL of 1% lidocaine 1:100,000 solution of epinephrine which was injected in the submucosal fashion along the left lateral nasal wall, middle turbinate, and right middle turbinate.  Her nose was then packed with Afrin-soaked cottonoid pledgets and were left in place for approximately 10 minutes to allow for vasoconstriction and hemostasis.  The Xomed Fusion navigation head gear was applied, and anatomic and surgical landmarks were identified and confirmed.  The navigation device was used throughout the surgical procedure for accurate anatomical localization.  The patient was positioned, prepped and draped.  The surgical procedure was begun.  Bilateral nasal endoscopy was performed.  On the right-hand side, the patient's sinonasal passageway was widely patent.  There was some scar tissue in the mid ethmoid region which was resected with through-cutting forceps and a straight microdebrider.  The frontal, maxillary, and ethmoid sinuses were inspected.  There was no evidence of active infection or  obstruction and no further scar tissue.  Attention was then turned to the patient's left-hand side.  The patient's left nasal cavity was inspected with the zero-degree telescope.  There was a small amount of mucopurulent material within the superior nasal cavity which was  suctioned.  Using the navigation device, the anterior ethmoid region was explored using a 45 degree telescope and a curved microdebrider.  The area of obstruction in the left frontal sinus was identified and then resected.  Through-cutting frontal sinus punches were used to take down further bony overhang, and the anterior ethmoid region was fully cleared of small air cells and scar tissue. Within the mid component of the left frontal sinus, there was thick mucopurulent material consistent with chronic sinusitis.  This was fully suctioned, and the sinus was irrigated.  Bone and polypoid material from the anterior aspect of the nasal frontal recess was resected with a curved microdebrider and frontal sinus punch, creating a widely patent frontal sinus ostium.  With completion of the anterior ethmoidectomy and revision frontal sinus surgery, a 50:50 mix of Bactroban and Kenalog 40 was instilled in the sinuses as a slurry to reduce risk of scar tissue and inflammatory changes.  This was placed in the frontal, ethmoid, and maxillary sinuses on the left and the ethmoid sinus on the right.  No packing was placed.  Sponge count was correct.  There was no active bleeding.  Surgical debris was cleared from the sinuses bilaterally.  An orogastric tube was passed and stomach contents were aspirated.  The patient was then awakened from anesthetic, extubated, and transferred from the operating room to the recovery room in stable condition, and there were no complications.  The estimated blood loss was approximately 50 mL.          ______________________________ Early Chars. Wilburn Cornelia, M.D.     DLS/MEDQ  D:  94/50/3888  T:  05/30/2014  Job:  [id number]

## 2014-05-31 ENCOUNTER — Encounter (HOSPITAL_COMMUNITY): Payer: Self-pay | Admitting: Otolaryngology

## 2014-08-01 ENCOUNTER — Encounter: Payer: Self-pay | Admitting: Gastroenterology

## 2014-08-03 ENCOUNTER — Telehealth: Payer: Self-pay | Admitting: Internal Medicine

## 2014-08-03 NOTE — Telephone Encounter (Signed)
I am seeing Terri Piedra, our NP.He is very experienced and compassionate.

## 2014-08-03 NOTE — Telephone Encounter (Signed)
Patient would like to know who Dr. Linna Darner recommends her switching to for PCP

## 2014-08-04 ENCOUNTER — Telehealth: Payer: Self-pay | Admitting: Internal Medicine

## 2014-08-04 NOTE — Telephone Encounter (Signed)
[facility]    --------------------------------------------------------------------------------   Patient Name: [name]  Gender: Female  DOB: 1945/03/08   Age: 69 Y 59 M 15 D  Return Phone Number: [phone] (Primary)  Address:     City/State/Zip: [place]  [address]   Client [facility] - Client  Client Site [facility] - Day  Physician [place] Type Call  Call Type Triage / Clinical  Relationship To Patient Self  Return Phone Number [phone] (Primary)  Chief Complaint Tick Bite  Initial Comment Caller States she has tick bites, but husband got the off, but they have been staying red for over a month. they itch and puffy.        Nurse Assessment       Guidelines          Guideline Title Affirmed Question Affirmed Notes Nurse Date/Time (Eastern Time)       Disp. Time Eilene Ghazi Time) Disposition Final User         08/04/2014 2:51:35 PM Clinical Call Yes Mechele Dawley, RN, Amy              After Care Instructions Given        Call Event Type User Date / Time Description        --------------------------------------------------------------------------------         Comments  User: Susanne Borders, RN Date/Time Eilene Ghazi Time): 08/04/2014 2:51:18 PM  CALLER STATES THAT SHE HAS BEEN GETTING SOME TICK BITES AND SHE HAS BEEN GETTING THEM REMOVED. SHE STATES THAT HER HUSBAND REMOVES THEM AND THAT IT LOOKS LIKE SOME OF THEM ARE CAUSING A SCAR ON THEM. SHE STATES THAT IT ALSO LOOKS VERY RED AROUND THEM. WAS GOING TO GO AHEAD WITH THE TRIAGE AND SHE STATES THAT SHE WILL JUST HAVE THE DOCTOR TO TAKE A LOOK AT HER WHEN SHE SEES HIM ON THE 8TH. INSTRUCTED HER TO BE SURE AND BE CAREFUL AS THERE ARE SYMPTOMS THAT SHE NEEDS TO MAKE SURE THAT SHE DOES NOT HAVE. WENT OVER SOME OF THOSE SYMPTOMS WITH HER. SHE VERBALIZED UNDERSTANDING OF ALL  INSTRUCTIONS GIVEN. SHE WANTS TO JUST KEEP THE APPT ON THE 8TH WITH DR. HOPPER.

## 2014-08-04 NOTE — Telephone Encounter (Signed)
Notified patient.

## 2014-08-08 ENCOUNTER — Telehealth: Payer: Self-pay | Admitting: *Deleted

## 2014-08-08 NOTE — Telephone Encounter (Signed)
[facility] Patient Name: [name] Gender: Female DOB: 09/27/1945 Age: 69 Y 69 M 15 D Return Phone Number: [phone] (Primary) Address: City/State/Zip: [place] [address] Client [facility] - Client Client Site [place] - Day Physician [place], [place] Type Call Call Type Triage / Clinical Relationship To Patient Self Appointment Disposition EMR Appointment Not Necessary Info pasted into Epic Yes Return Phone Number [phone] (Primary) Chief Complaint Tick Bite Initial Comment Caller States she has tick bites, but husband got the off, but they have been staying red for over a month. they itch and puffy. Nurse Assessment Guidelines Guideline Title Affirmed Question Affirmed Notes Nurse Date/Time (Eastern Time) Disp. Time Eilene Ghazi Time) Disposition Final User 08/04/2014 2:51:35 PM Clinical Call Yes Mechele Dawley, RN, Amy After Care Instructions Given Call Event Type User Date / Time Description Comments User: Susanne Borders, RN Date/Time Eilene Ghazi Time): 08/04/2014 2:51:18 PM CALLER STATES THAT SHE HAS BEEN GETTING SOME TICK BITES AND SHE HAS BEEN GETTING THEM REMOVED. SHE STATES THAT HER HUSBAND REMOVES THEM AND THAT IT LOOKS LIKE SOME OF THEM ARE CAUSING A SCAR ON THEM. SHE STATES THAT IT ALSO LOOKS VERY RED AROUND THEM. WAS GOING TO GO AHEAD WITH THE TRIAGE AND SHE STATES THAT SHE WILL JUST HAVE THE DOCTOR TO TAKE A LOOK AT HER WHEN SHE SEES HIM ON THE 8TH. INSTRUCTED HER TO BE SURE AND BE CAREFUL AS THERE ARE SYMPTOMS THAT SHE NEEDS TO MAKE SURE THAT SHE DOES NOT HAVE. WENT OVER SOME OF THOSE SYMPTOMS WITH HER. SHE VERBALIZED UNDERSTANDING OF ALL INSTRUCTIONS GIVEN. SHE WANTS TO JUST KEEP THE APPT ON THE 8TH WITH DR. Annette Stable

## 2014-08-09 ENCOUNTER — Other Ambulatory Visit (INDEPENDENT_AMBULATORY_CARE_PROVIDER_SITE_OTHER): Payer: 59

## 2014-08-09 ENCOUNTER — Ambulatory Visit (INDEPENDENT_AMBULATORY_CARE_PROVIDER_SITE_OTHER): Payer: 59 | Admitting: Internal Medicine

## 2014-08-09 ENCOUNTER — Encounter: Payer: Self-pay | Admitting: Internal Medicine

## 2014-08-09 ENCOUNTER — Other Ambulatory Visit: Payer: Self-pay | Admitting: Internal Medicine

## 2014-08-09 VITALS — BP 108/74 | HR 62 | Temp 97.6°F | Resp 16 | Wt 132.0 lb

## 2014-08-09 DIAGNOSIS — L659 Nonscarring hair loss, unspecified: Secondary | ICD-10-CM | POA: Diagnosis not present

## 2014-08-09 DIAGNOSIS — Z8249 Family history of ischemic heart disease and other diseases of the circulatory system: Secondary | ICD-10-CM

## 2014-08-09 DIAGNOSIS — R6889 Other general symptoms and signs: Secondary | ICD-10-CM

## 2014-08-09 DIAGNOSIS — R5383 Other fatigue: Secondary | ICD-10-CM

## 2014-08-09 DIAGNOSIS — E785 Hyperlipidemia, unspecified: Secondary | ICD-10-CM | POA: Diagnosis not present

## 2014-08-09 LAB — T4, FREE: Free T4: 0.77 ng/dL (ref 0.60–1.60)

## 2014-08-09 LAB — TSH: TSH: 2.37 u[IU]/mL (ref 0.35–4.50)

## 2014-08-09 LAB — T3, FREE: T3, Free: 3.2 pg/mL (ref 2.3–4.2)

## 2014-08-09 NOTE — Progress Notes (Signed)
   Subjective:    Patient ID: [name], female    DOB: 29-Jun-1945, 69 y.o.   MRN: [id number]  HPI   She's concerned that she's had loss of her eyebrows over the last several years. She also has fatigue even after rest. She has a history of fibromyalgia. She does have intolerance to cold.  Recently she had multiple tick bites on her legs after working in the yard. She has seen the Dermatologist; gel was prescribed for itching. There was no associated rash or constitutional symptoms.  She does have chronic neuropathic symptoms in her feet and legs.  She is concerned about her lipids. She has a strong family history of coronary artery disease with some premature heart attacks in the family including her father. She is seeking natural methods to treat the hyperlipidemia.   Review of Systems Chest pain, palpitations, tachycardia, exertional dyspnea, paroxysmal nocturnal dyspnea, claudication or edema are absent.   She has no heat intolerance.  She has no constipation.  No associated itchy, watery eyes.  Swelling of the lips or tongue or intraoral lesions denied.  Shortness of breath, wheezing, or cough absent.  No vesicles, pustules or urticaria noted.  Fever ,chills , or sweats denied.   Diarrhea not present.  No dysuria, pyuria or hematuria.    Objective:   Physical Exam  Gen.:  Thin but adequately nourished; in no acute distress Eyes: Extraocular motion intact; no lid lag , or proptosis. Unsustained vertical nystagmus. Eyebrows are dramatically thin, especially laterally. Neck: full ROM; no masses ; thyroid normal  Heart: Normal rhythm and rate without significant murmur, gallop, or extra heart sounds Lungs: Chest clear to auscultation without rales,rales, wheezes Neuro:Deep tendon reflexes are equal and within normal limits; no tremor . Abdomen: bowel sounds normal, soft and non-tender without masses, organomegaly or hernias noted.  No guarding or  rebound Aorta palpable with faint bruit ; no AAA Skin/Nails: Warm and dry without significant lesions or rashes.Onycholysis can'tbe assessed duse to nail polish Lymphatic: no cervical or axillary LA Psych: Normally communicative and interactive; no abnormal mood or affect clinically.           Assessment & Plan:  See Current Assessment & Plan in Problem List under specific Diagnosis

## 2014-08-09 NOTE — Patient Instructions (Signed)
  Your next office appointment will be determined based upon review of your pending labs. Those written interpretation of the lab results and instructions will be transmitted to you by mail.  Critical results will be called.   Followup as needed for any active or acute issue. Please report any significant change in your symptoms.

## 2014-08-09 NOTE — Progress Notes (Signed)
Pre visit review using our clinic review tool, if applicable. No additional management support is needed unless otherwise documented below in the visit note. 

## 2014-08-10 NOTE — Assessment & Plan Note (Signed)
NMR Lipoprofile

## 2014-08-10 NOTE — Assessment & Plan Note (Signed)
Full TFTs ?

## 2014-08-11 LAB — NMR LIPOPROFILE WITH LIPIDS
Cholesterol, Total: 199 mg/dL (ref 100–199)
HDL Particle Number: 35 umol/L (ref >=30.5)
HDL SIZE: 9.8 nm (ref >=9.2)
HDL-C: 75 mg/dL (ref >39)
LDL (calc): 114 mg/dL — ABNORMAL HIGH (ref 0–99)
LDL Particle Number: 1295 nmol/L — ABNORMAL HIGH (ref <1000)
LDL SIZE: 21.5 nm (ref >=20.8)
LP-IR Score: <25 (ref <=45)
Large HDL-P: 13.1 umol/L (ref >=4.8)
Large VLDL-P: <0.8 nmol/L (ref <=2.7)
SMALL LDL PARTICLE NUMBER: 159 nmol/L (ref <=527)
Triglycerides: 50 mg/dL (ref 0–149)
VLDL SIZE: 38.2 nm (ref <=46.6)

## 2014-08-14 ENCOUNTER — Ambulatory Visit: Payer: 59 | Admitting: Internal Medicine

## 2014-08-18 ENCOUNTER — Ambulatory Visit: Payer: 59 | Admitting: Cardiovascular Disease

## 2014-09-07 ENCOUNTER — Telehealth: Payer: Self-pay | Admitting: Internal Medicine

## 2014-09-07 NOTE — Telephone Encounter (Signed)
Rec'd from [facility] and Throat forward 4 pages to Dr. Linna Darner

## 2014-09-13 ENCOUNTER — Ambulatory Visit: Payer: 59 | Admitting: Cardiovascular Disease

## 2014-10-12 ENCOUNTER — Encounter: Payer: Self-pay | Admitting: Neurology

## 2014-10-12 ENCOUNTER — Ambulatory Visit (INDEPENDENT_AMBULATORY_CARE_PROVIDER_SITE_OTHER): Payer: Commercial Managed Care - HMO | Admitting: Neurology

## 2014-10-12 VITALS — BP 95/70 | HR 60 | Ht 68.0 in | Wt 132.4 lb

## 2014-10-12 DIAGNOSIS — G609 Hereditary and idiopathic neuropathy, unspecified: Secondary | ICD-10-CM

## 2014-10-12 DIAGNOSIS — M791 Myalgia: Secondary | ICD-10-CM

## 2014-10-12 DIAGNOSIS — M609 Myositis, unspecified: Secondary | ICD-10-CM

## 2014-10-12 DIAGNOSIS — E538 Deficiency of other specified B group vitamins: Secondary | ICD-10-CM | POA: Diagnosis not present

## 2014-10-12 DIAGNOSIS — IMO0001 Reserved for inherently not codable concepts without codable children: Secondary | ICD-10-CM

## 2014-10-12 MED ORDER — DULOXETINE HCL 20 MG PO CPEP: 20.0000 mg | ORAL_CAPSULE | Freq: Every day | ORAL | Status: DC

## 2014-10-12 NOTE — Patient Instructions (Addendum)
We will start a medication called Cymbalta for the peripheral neuropathy, and set you up for physical therapy evaluation for Anodyne therapy. We will follow-up in several months.  Peripheral Neuropathy Peripheral neuropathy is a type of nerve damage. It affects nerves that carry signals between the spinal cord and other parts of the body. These are called peripheral nerves. With peripheral neuropathy, one nerve or a group of nerves may be damaged.  CAUSES  Many things can damage peripheral nerves. For some people with peripheral neuropathy, the cause is unknown. Some causes include:  Diabetes. This is the most common cause of peripheral neuropathy.  Injury to a nerve.  Pressure or stress on a nerve that lasts a long time.  Too little vitamin B. Alcoholism can lead to this.  Infections.  Autoimmune diseases, such as multiple sclerosis and systemic lupus erythematosus.  Inherited nerve diseases.  Some medicines, such as cancer drugs.  Toxic substances, such as lead and mercury.  Too little blood flowing to the legs.  Kidney disease.  Thyroid disease. SIGNS AND SYMPTOMS  Different people have different symptoms. The symptoms you have will depend on which of your nerves is damaged. Common symptoms include:  Loss of feeling (numbness) in the feet and hands.  Tingling in the feet and hands.  Pain that burns.  Very sensitive skin.  Weakness.  Not being able to move a part of the body (paralysis).  Muscle twitching.  Clumsiness or poor coordination.  Loss of balance.  Not being able to control your bladder.  Feeling dizzy.  Sexual problems. DIAGNOSIS  Peripheral neuropathy is a symptom, not a disease. Finding the cause of peripheral neuropathy can be hard. To figure that out, your health care provider will take a medical history and do a physical exam. A neurological exam will also be done. This involves checking things affected by your brain, spinal cord, and  nerves (nervous system). For example, your health care provider will check your reflexes, how you move, and what you can feel.  Other types of tests may also be ordered, such as:  Blood tests.  A test of the fluid in your spinal cord.  Imaging tests, such as CT scans or an MRI.  Electromyography (EMG). This test checks the nerves that control muscles.  Nerve conduction velocity tests. These tests check how fast messages pass through your nerves.  Nerve biopsy. A small piece of nerve is removed. It is then checked under a microscope. TREATMENT   Medicine is often used to treat peripheral neuropathy. Medicines may include:  Pain-relieving medicines. Prescription or over-the-counter medicine may be suggested.  Antiseizure medicine. This may be used for pain.  Antidepressants. These also may help ease pain from neuropathy.  Lidocaine. This is a numbing medicine. You might wear a patch or be given a shot.  Mexiletine. This medicine is typically used to help control irregular heart rhythms.  Surgery. Surgery may be needed to relieve pressure on a nerve or to destroy a nerve that is causing pain.  Physical therapy to help movement.  Assistive devices to help movement. HOME CARE INSTRUCTIONS   Only take over-the-counter or prescription medicines as directed by your health care provider. Follow the instructions carefully for any given medicines. Do not take any other medicines without first getting approval from your health care provider.  If you have diabetes, work closely with your health care provider to keep your blood sugar under control.  If you have numbness in your feet:  Check  every day for signs of injury or infection. Watch for redness, warmth, and swelling.  Wear padded socks and comfortable shoes. These help protect your feet.  Do not do things that put pressure on your damaged nerve.  Do not smoke. Smoking keeps blood from getting to damaged nerves.  Avoid or  limit alcohol. Too much alcohol can cause a lack of B vitamins. These vitamins are needed for healthy nerves.  Develop a good support system. Coping with peripheral neuropathy can be stressful. Talk to a mental health specialist or join a support group if you are struggling.  Follow up with your health care provider as directed. SEEK MEDICAL CARE IF:   You have new signs or symptoms of peripheral neuropathy.  You are struggling emotionally from dealing with peripheral neuropathy.  You have a fever. SEEK IMMEDIATE MEDICAL CARE IF:   You have an injury or infection that is not healing.  You feel very dizzy or begin vomiting.  You have chest pain.  You have trouble breathing. Document Released: 02/07/2002 Document Revised: 10/30/2010 Document Reviewed: 10/25/2012 [facility] Patient Information 2015 [place], [place]. This information is not intended to replace advice given to you by your health care provider. Make sure you discuss any questions you have with your health care provider.

## 2014-10-12 NOTE — Progress Notes (Signed)
Reason for visit: Peripheral neuropathy  [name] is an 69 y.o. female  History of present illness:  [name] is a 69 year old right-handed female with a history of a peripheral neuropathy. The patient has discomfort in the feet and half way up the legs, she is noticing some tingling in the hands as well. She has had gradual worsening of the discomfort, she was last seen through this office in 2014. The patient denies any significant balance issues, she will stumble on occasion. She has chronic low back pain and neck pain. She indicates that she was told that she needed cervical spine surgery, but she never opted for this treatment. The patient at times is not sleeping well because of the discomfort, if she is up on her feet for long periods of time during the day, she has increased pain. She has colitis, she has trouble with the bowels for this reason, and she has noted some urinary frequency, no incontinence. Currently, she is not on any medications for her neuropathy discomfort. In the past, gabapentin and Lyrica were not well tolerated.  Past Medical History  Diagnosis Date  . Diverticulosis   . Gastroesophageal reflux disease with hiatal hernia   . IBS (irritable bowel syndrome)   . Hyperlipidemia   . Spondylosis   . Migraine headache   . Schatzki's ring     History of  . Osteopenia     BMD ordered by GYN  . Mitral valve prolapse   . Depression     generalized anxiety disorder  . Hiatal hernia   . Fibromyalgia   . Peripheral neuropathy     treated as RLS by  Neurology  . Lymphocytic colitis     Dr Sharlett Iles  . Duodenal diverticulum   . Anxiety   . Restless leg syndrome   . Complication of anesthesia   . PONV (postoperative nausea and vomiting)   . Family history of adverse reaction to anesthesia     Mother and Daughters- N/V  . Sleep apnea     On CPAP, has not been using  . History of kidney stones   . Neuropathy     Past Surgical History    Procedure Laterality Date  . Tubal ligation    . Cholecystectomy    . Vaginal cystectomy      x 2  . Rotator cuff repair Left     left  . Sinus surgery with instatrak      x 2  . Esophageal dilation      X 2  . Breast enhancement surgery    . Cataract surgery Left     Dr Bing Plume  . Colonoscopy    . Eye surgery      cataract  . Vaginal hysterectomy  05/2007    Vaginal repair, Dr Ubaldo Glassing.  Partial  hysterectomy.  . Sinus endo w/fusion N/A 05/30/2014    Procedure: REVISION  FRONTAL SINUS SURGERY WITH FUSION SCAN;  Surgeon: Jerrell Belfast, MD;  Location: [facility] OR;  Service: ENT;  Laterality: N/A;    Family History  Problem Relation Age of Onset  . Hypertension Father 108  . Stroke Father 23  . Heart attack Father      ? in 59s  . Hypertension Mother   . Neuropathy Mother   . Anemia Mother   . Coronary artery disease Brother     Stent placement in 60s  . Colon cancer Neg Hx   . Seizures Neg Hx   .  Diabetes Maternal Uncle   . Heart attack Paternal Uncle     SEVEN , ? age    Social history:  reports that she quit smoking about 10 years ago. Her smoking use included Cigarettes. She has a 7.5 pack-year smoking history. She has never used smokeless tobacco. She reports that she does not drink alcohol or use illicit drugs.    Allergies  Allergen Reactions  . Adhesive [Tape] Rash  . Lyrica [Pregabalin] Other (See Comments)    Sedation  . Neurontin [Gabapentin] Other (See Comments)    Sedation  . Nitrofuran Derivatives Other (See Comments)    "tingling"    Medications:  Prior to Admission medications   Medication Sig Start Date End Date Taking? Authorizing Provider  ALPRAZolam (XANAX) 0.25 MG tablet Take 1 tablet (0.25 mg total) by mouth at bedtime as needed for anxiety. 11/02/13  Yes Terrance Mass, MD  b complex vitamins capsule Take 1 capsule by mouth daily.   Yes Historical Provider, MD  Biotin 1000 MCG tablet Take 1,000 mcg by mouth daily.     Yes Historical Provider, MD   estradiol (ESTRACE) 0.1 MG/GM vaginal cream Apply twice a week 11/02/13  Yes Terrance Mass, MD  Magnesium Oxide 250 MG TABS Take 250 mg by mouth daily.    Yes Historical Provider, MD  Probiotic Product (ALIGN) 4 MG CAPS Take 1 capsule by mouth daily.    Yes Historical Provider, MD  sodium chloride (OCEAN) 0.65 % SOLN nasal spray Place 1 spray into both nostrils as needed for congestion.   Yes Historical Provider, MD  traZODone (DESYREL) 150 MG tablet Take 1-2 tablets by mouth at bedtime 04/17/14  Yes Terrance Mass, MD  vitamin C (ASCORBIC ACID) 500 MG tablet Take 500 mg by mouth daily.   Yes Historical Provider, MD    ROS:  Out of a complete 14 system review of symptoms, the patient complains only of the following symptoms, and all other reviewed systems are negative.  Peripheral neuropathy, tingling Neck and low back pain  Blood pressure 95/70, pulse 60, height 5\' 8"  (1.727 m), weight 132 lb 6.4 oz (60.056 kg).  Physical Exam  General: The patient is alert and cooperative at the time of the examination.  Skin: No significant peripheral edema is noted.   Neurologic Exam  Mental status: The patient is alert and oriented x 3 at the time of the examination. The patient has apparent normal recent and remote memory, with an apparently normal attention span and concentration ability.   Cranial nerves: Facial symmetry is present. Speech is normal, no aphasia or dysarthria is noted. Extraocular movements are full. Visual fields are full.  Motor: The patient has good strength in all 4 extremities.  Sensory examination: Soft touch sensation is symmetric on the face, arms, and legs. There is a stocking pattern pinprick sensory deficit up to the knees bilaterally.  Coordination: The patient has good finger-nose-finger and heel-to-shin bilaterally.  Gait and station: The patient has a normal gait. Tandem gait is normal. Romberg is negative. No drift is seen.  Reflexes: Deep tendon  reflexes are symmetric, but are depressed.   Assessment/Plan:  1. Peripheral neuropathy  2. Chronic low back and neck discomfort  The patient is having ongoing discomfort secondary to the peripheral neuropathy. The patient wishes to recheck blood work again regarding various treatable causes of neuropathy, this will be done. The patient will be placed on low-dose Cymbalta taking 20 mg daily, if she is tolerating  the dose but requires more medication, she is to contact our office. The patient will be set up for a physical therapy evaluation for Anodyne therapy. She will follow-up in 6 months.  Jill Alexanders MD 10/12/2014 7:53 PM  [facility] [address] [place], [place] [address]  Phone [phone] Fax [phone]

## 2014-10-16 LAB — METHYLMALONIC ACID, SERUM: METHYLMALONIC ACID: 209 nmol/L (ref 0–378)

## 2014-10-16 LAB — COPPER, SERUM: Copper: 111 ug/dL (ref 72–166)

## 2014-10-16 LAB — ANA W/REFLEX: Anti Nuclear Antibody(ANA): NEGATIVE

## 2014-10-16 LAB — VITAMIN B12: Vitamin B-12: 452 pg/mL (ref 211–946)

## 2014-10-17 ENCOUNTER — Telehealth: Payer: Self-pay

## 2014-10-17 NOTE — Telephone Encounter (Signed)
-----   Message from Kathrynn Ducking, MD sent at 10/16/2014  1:09 PM EDT -----  The blood work results are unremarkable. Please call the patient.  ----- Message -----    From: [facility] Lab Results In Interface    Sent: 10/13/2014   7:45 AM      To: Kathrynn Ducking, MD

## 2014-10-17 NOTE — Telephone Encounter (Signed)
I called the patient and relayed results. 

## 2014-10-18 ENCOUNTER — Other Ambulatory Visit: Payer: Self-pay

## 2014-10-18 MED ORDER — TRAZODONE HCL 150 MG PO TABS: ORAL_TABLET | ORAL | Status: DC

## 2014-10-18 NOTE — Telephone Encounter (Signed)
Needs annual exam next month

## 2014-10-18 NOTE — Telephone Encounter (Signed)
Patient has CE scheduled. Rx called in to pharmacy.

## 2014-11-01 ENCOUNTER — Ambulatory Visit (INDEPENDENT_AMBULATORY_CARE_PROVIDER_SITE_OTHER): Payer: 59 | Admitting: Cardiovascular Disease

## 2014-11-01 ENCOUNTER — Encounter: Payer: Self-pay | Admitting: Cardiovascular Disease

## 2014-11-01 VITALS — BP 100/60 | HR 64 | Ht 68.5 in | Wt 133.5 lb

## 2014-11-01 DIAGNOSIS — R0602 Shortness of breath: Secondary | ICD-10-CM

## 2014-11-01 DIAGNOSIS — I951 Orthostatic hypotension: Secondary | ICD-10-CM | POA: Diagnosis not present

## 2014-11-01 DIAGNOSIS — G609 Hereditary and idiopathic neuropathy, unspecified: Secondary | ICD-10-CM | POA: Diagnosis not present

## 2014-11-01 DIAGNOSIS — R079 Chest pain, unspecified: Secondary | ICD-10-CM

## 2014-11-01 DIAGNOSIS — E785 Hyperlipidemia, unspecified: Secondary | ICD-10-CM

## 2014-11-01 DIAGNOSIS — R002 Palpitations: Secondary | ICD-10-CM

## 2014-11-01 NOTE — Assessment & Plan Note (Addendum)
She reports some episodes of tachycardia. Prior short runs of SVT on Holter monitor several years ago. She reports symptoms are getting worse. 30 day monitor suggested if symptoms get worse. She has declined at this time Would be difficult to start beta blockers given her low blood pressure. Potentially if symptoms were severe, could start antiarrhythmics such as flecainide

## 2014-11-01 NOTE — Patient Instructions (Addendum)
You are doing well. No medication changes were made.  Please call if tachycardia gets worse,  A 30 day monitor could be ordered  You are scheduled for a coronary calcium score Thursday, Sept 8 @ 10:00, please arrive @ 9:45 There is a one-time fee of $150 due at the time of your procedure  Please call us if you have new issues that need to be addressed before your next appt.  Your physician wants you to follow-up in: 12 months.  You will receive a reminder letter in the mail two months in advance. If you don't receive a letter, please call our office to schedule the follow-up appointment.

## 2014-11-01 NOTE — Assessment & Plan Note (Signed)
She is troubled by her lower extremity neuropathy, etiology unclear. Managed by primary care Did not tolerate Cymbalta

## 2014-11-01 NOTE — Assessment & Plan Note (Signed)
She is concerned about her cholesterol. She reports a family history. No prior coronary disease on catheterization in the past After long discussion, we'll order a coronary calcium score for risk stratification

## 2014-11-01 NOTE — Assessment & Plan Note (Signed)
Currently not taking midodrine Previously had reported having some benefit, now no notable difference No further testing at this time or medications. Encouraged fluid hydration

## 2014-11-01 NOTE — Progress Notes (Signed)
Patient ID: [name], female    DOB: 1945/12/15, 69 y.o.   MRN: [id number]  HPI Comments: 69 yo WF with history of hyperlipidemia,fibromyalgia, extensive GI disease including hiatal hernia, GERD, gastritis, Schatzkes ring s/p dilatations presents for follow-up of her chest heaviness.  In follow-up today, she reports having episodes of tachycardia She had a prior Holter monitor in 2012 showing rare short runs of SVT, ectopy. Symptoms seem to come and go, sometimes daytime, nighttime, seems to be worse, lasting longer. There is stress at home, details unclear. She has stopped midodrine, has not noticed any significant change in the way she feels. Denies having any near syncope or orthostasis.  She is also concerned about family history of coronary artery disease and her chest pressure. Brother with a pacemaker  She reports having problems with neuropathy. Etiology unclear.  EKG on today's visit shows normal sinus rhythm with rate 64 bpm, no significant ST or T-wave changes  Other past medical history chronically low blood pressure, previous symptoms of chronic dizziness and fatigue.  She also reports having obstructive sleep apnea, significant snoring and CPAP. She's not wearing her CPAP   previous episodes of heaviness in her chest. Cardiac catheterization in the past showed no significant disease.  Holter monitor showed short runs of SVT. 48-hour study.  episodes of shortness of breath, 30 day event monitor showing ABCs but no significant SVT.   Previous sinus surgery and reports her breathing is better. Still continues to snore with frequent waking at night  Prior echocardiogram showed mild MR with no other significant abnormalities. Strong family history of coronary artery disease.  cholesterol 132, HDL 68, LDL 54  Allergies  Allergen Reactions  . Adhesive [Tape] Rash  . Lyrica [Pregabalin] Other (See Comments)    Sedation  . Neurontin [Gabapentin] Other (See  Comments)    Sedation  . Nitrofuran Derivatives Other (See Comments)    "tingling"    Current Outpatient Prescriptions on File Prior to Visit  Medication Sig Dispense Refill  . ALPRAZolam (XANAX) 0.25 MG tablet Take 1 tablet (0.25 mg total) by mouth at bedtime as needed for anxiety. 30 tablet 5  . b complex vitamins capsule Take 1 capsule by mouth daily.    . Biotin 1000 MCG tablet Take 1,000 mcg by mouth daily.      [name] estradiol (ESTRACE) 0.1 MG/GM vaginal cream Apply twice a week 42.5 g 10  . Magnesium Oxide 250 MG TABS Take 250 mg by mouth daily.     . Probiotic Product (ALIGN) 4 MG CAPS Take 1 capsule by mouth daily.     . sodium chloride (OCEAN) 0.65 % SOLN nasal spray Place 1 spray into both nostrils as needed for congestion.    . traZODone (DESYREL) 150 MG tablet Take 1-2 tablets by mouth at bedtime 30 tablet 0  . vitamin C (ASCORBIC ACID) 500 MG tablet Take 500 mg by mouth daily.    . [DISCONTINUED] colesevelam (WELCHOL) 625 MG tablet Take 625 mg by mouth 2 (two) times daily with a meal. Take one tablet by mouth twice a day    . [DISCONTINUED] Hyoscyamine-Phenyltoloxamine ([place] NF) [id number] MG CAPS Take one tablet by mouth as needed for abdominal cramping 24 each 0   No current facility-administered medications on file prior to visit.    Past Medical History  Diagnosis Date  . Diverticulosis   . Gastroesophageal reflux disease with hiatal hernia   . IBS (irritable bowel syndrome)   . Hyperlipidemia   .  Spondylosis   . Migraine headache   . Schatzki's ring     History of  . Osteopenia     BMD ordered by GYN  . Mitral valve prolapse   . Depression     generalized anxiety disorder  . Hiatal hernia   . Fibromyalgia   . Peripheral neuropathy     treated as RLS by  Neurology  . Lymphocytic colitis     Dr Sharlett Iles  . Duodenal diverticulum   . Anxiety   . Restless leg syndrome   . Complication of anesthesia   . PONV (postoperative nausea and vomiting)   . Family  history of adverse reaction to anesthesia     Mother and Daughters- N/V  . Sleep apnea     On CPAP, has not been using  . History of kidney stones   . Neuropathy     Past Surgical History  Procedure Laterality Date  . Tubal ligation    . Cholecystectomy    . Vaginal cystectomy      x 2  . Rotator cuff repair Left     left  . Sinus surgery with instatrak      x 2  . Esophageal dilation      X 2  . Breast enhancement surgery    . Cataract surgery Left     Dr Bing Plume  . Colonoscopy    . Eye surgery      cataract  . Vaginal hysterectomy  05/2007    Vaginal repair, Dr Ubaldo Glassing.  Partial  hysterectomy.  . Sinus endo w/fusion N/A 05/30/2014    Procedure: REVISION  FRONTAL SINUS SURGERY WITH FUSION SCAN;  Surgeon: Jerrell Belfast, MD;  Location: [facility];  Service: ENT;  Laterality: N/A;    Social History  reports that she quit smoking about 10 years ago. Her smoking use included Cigarettes. She has a 7.5 pack-year smoking history. She has never used smokeless tobacco. She reports that she does not drink alcohol or use illicit drugs.  Family History family history includes Anemia in her mother; Coronary artery disease in her brother; Diabetes in her maternal uncle; Heart attack in her father and paternal uncle; Hypertension in her mother; Hypertension (age of onset: 4) in her father; Neuropathy in her mother; Stroke (age of onset: 38) in her father. There is no history of Colon cancer or Seizures.   Review of Systems  Respiratory: Negative.   Cardiovascular: Positive for palpitations.       Tachycardia  Gastrointestinal: Negative.   Musculoskeletal: Negative.   Neurological: Negative.   Hematological: Negative.   Psychiatric/Behavioral: Negative.   All other systems reviewed and are negative.   BP 100/60 mmHg  Pulse 64  Ht 5' 8.5" (1.74 m)  Wt 133 lb 8 oz (60.555 kg)  BMI 20.00 kg/m2  Physical Exam  Constitutional: She is oriented to person, place, and time. She appears  well-developed and well-nourished.  Thin  HENT:  Head: Normocephalic.  Nose: Nose normal.  Mouth/Throat: Oropharynx is clear and moist.  Eyes: Conjunctivae are normal. Pupils are equal, round, and reactive to light.  Neck: Normal range of motion. Neck supple. No JVD present.  Cardiovascular: Normal rate, regular rhythm, S1 normal, S2 normal, normal heart sounds and intact distal pulses.  Exam reveals no gallop and no friction rub.   No murmur heard. Pulmonary/Chest: Effort normal and breath sounds normal. No respiratory distress. She has no wheezes. She has no rales. She exhibits no tenderness.  Abdominal: Soft. Bowel  sounds are normal. She exhibits no distension. There is no tenderness.  Musculoskeletal: Normal range of motion. She exhibits no edema or tenderness.  Lymphadenopathy:    She has no cervical adenopathy.  Neurological: She is alert and oriented to person, place, and time. Coordination normal.  Skin: Skin is warm and dry. No rash noted. No erythema.  Psychiatric: She has a normal mood and affect. Her behavior is normal. Judgment and thought content normal.    Assessment and Plan  Nursing note and vitals reviewed.

## 2014-11-08 ENCOUNTER — Ambulatory Visit (INDEPENDENT_AMBULATORY_CARE_PROVIDER_SITE_OTHER): Payer: Commercial Managed Care - HMO | Admitting: Gynecology

## 2014-11-08 ENCOUNTER — Encounter: Payer: Self-pay | Admitting: Gynecology

## 2014-11-08 VITALS — BP 126/74 | Ht 68.5 in | Wt 136.0 lb

## 2014-11-08 DIAGNOSIS — M8580 Other specified disorders of bone density and structure, unspecified site: Secondary | ICD-10-CM

## 2014-11-08 DIAGNOSIS — N952 Postmenopausal atrophic vaginitis: Secondary | ICD-10-CM

## 2014-11-08 DIAGNOSIS — IMO0002 Reserved for concepts with insufficient information to code with codable children: Secondary | ICD-10-CM

## 2014-11-08 DIAGNOSIS — N941 Dyspareunia: Secondary | ICD-10-CM | POA: Diagnosis not present

## 2014-11-08 DIAGNOSIS — Z01419 Encounter for gynecological examination (general) (routine) without abnormal findings: Secondary | ICD-10-CM

## 2014-11-08 MED ORDER — OSPEMIFENE 60 MG PO TABS: 60.0000 mg | ORAL_TABLET | Freq: Every day | ORAL | Status: DC

## 2014-11-08 MED ORDER — ALPRAZOLAM 0.25 MG PO TABS: 0.2500 mg | ORAL_TABLET | Freq: Every evening | ORAL | Status: DC | PRN

## 2014-11-08 MED ORDER — TRAZODONE HCL 150 MG PO TABS: ORAL_TABLET | ORAL | Status: DC

## 2014-11-08 NOTE — Patient Instructions (Signed)
Ospemifene oral tablets What is this medicine? OSPEMIFENE (os PEM i feen) is used to treat painful sexual intercourse in females after menopause, a symptom of menopause that occurs due to changes in and around the vagina. This medicine may be used for other purposes; ask your health care provider or pharmacist if you have questions. COMMON BRAND NAME(S): Osphena What should I tell my health care provider before I take this medicine? They need to know if you have any of these conditions: -cancer, such as breast, uterine, or other cancer -heart disease -history of blood clots -history of stroke -history of vaginal bleeding -liver disease -premenopausal -smoke tobacco -an unusual or allergic reaction to ospemifene, other medicines, foods, dyes, or preservatives -pregnant or trying to get pregnant -breast-feeding How should I use this medicine? Take this medicine by mouth with a glass of water. Take this medicine with food. Follow the directions on the prescription label. Do not take your medicine more often than directed. Talk to your pediatrician regarding the use of this medicine in children. Special care may be needed. Overdosage: If you think you've taken too much of this medicine contact a poison control center or emergency room at once. Overdosage: If you think you have taken too much of this medicine contact a poison control center or emergency room at once. NOTE: This medicine is only for you. Do not share this medicine with others. What if I miss a dose? If you miss a dose, take it as soon as you can. If it is almost time for your next dose, take only that dose. Do not take double or extra doses. What may interact with this medicine? -doxycycline -estrogens -fluconazole -furosemide -glyburide -ketoconazole -phenytoin -rifampin -warfarin This list may not describe all possible interactions. Give your health care provider a list of all the medicines, herbs, non-prescription  drugs, or dietary supplements you use. Also tell them if you smoke, drink alcohol, or use illegal drugs. Some items may interact with your medicine. What should I watch for while using this medicine? Visit your health care professional for regular checks on your progress. You will need a regular breast and pelvic exam and Pap smear while on this medicine. You should also discuss the need for regular mammograms with your health care professional, and follow his or her guidelines for these tests. Also, periodically discuss the need to continue taking this medicine. Taking this medicine for long periods of time may increase your risk for serious side effects. This medicine can increase the risk of developing a condition (endometrial hyperplasia) that may lead to cancer of the lining of the uterus. Taking progestins, another hormone drug, with this medicine lowers the risk of developing this condition. Therefore, if your uterus has not been removed (by a hysterectomy), your doctor may prescribe a progestin for you to take together with your estrogen. You should know, however, that taking estrogens with progestins may have additional health risks. You should discuss the use of estrogens and progestins with your health care professional to determine the benefits and risks for you. This medicine can rarely cause blood clots. You should avoid long periods of bed rest while taking this medicine. If you are going to have surgery, tell your doctor or health care professional that you are taking this medicine. This medicine should be stopped at least 4-6 weeks before surgery. After surgery, it should be restarted only after you are walking again. It should not be restarted while you still need long periods of bed   rest. You should not smoke while taking this medicine. Smoking may also increase your risk of blood clots. Smoking can also decrease the effects of this medicine. This medicine does not prevent hot flashes. It  may cause hot flashes in some patients. If you have any reason to think you are pregnant; stop taking this medicine at once and contact your doctor or health care professional. What side effects may I notice from receiving this medicine? Side effects that you should report to your doctor or health care professional as soon as possible: -breathing problems -changes in vision -confusion, trouble speaking or understanding -new breast lumps -pain, swelling, warmth in the leg -pelvic pain or pressure -severe headaches -sudden chest pain -sudden numbness or weakness of the face, arm or leg -trouble walking, dizziness, loss of balance or coordination -unusual vaginal bleeding patterns -vaginal discharge that is bloody or brown Side effects that usually do not require medical attention (Report these to your doctor or health care professional if they continue or are bothersome.): -hot flushes or flashes -increased sweating -muscle cramps -vaginal discharge (white or clear) This list may not describe all possible side effects. Call your doctor for medical advice about side effects. You may report side effects to FDA at 1-800-FDA-1088. Where should I keep my medicine? Keep out of the reach of children. Store at room temperature between 20 and 25 degrees C (68 and 77 degrees F). Protect from light. Keep container tightly closed. Throw away any unused medicine after the expiration date. NOTE: This sheet is a summary. It may not cover all possible information. If you have questions about this medicine, talk to your doctor, pharmacist, or health care provider.  2015, Elsevier/Gold Standard. (2013-05-03 11:46:51)  

## 2014-11-08 NOTE — Progress Notes (Signed)
[name] [birth year] [id number]   History:    69 y.o.  for annual gyn exam with a complaint of dyspareunia and vaginal dryness despite using vaginal estrogen twice a week. She also has suffer from many years with insomnia in over 10 years ago she went to the sleep apnea clinic but has not returned. She takes trazodone 150 mg by mouth at bedtime when necessary. Also Xanax 0.25 mg by mouth daily when necessary anxiety. She is no longer taking Zoloft for depression. She had also complaining of thinning of her eyebrow and this was discussed also with her PCP in June and her thyroid panel was normal. Prior to using vaginal estrogen several years in the past she had taken oral estrogen for menopausal symptoms. Dr. Linna Darner has been doing her blood work.    The patient was seen for the first time as a new patient in 2014. She was previously been followed by Dr. Ubaldo Glassing.Review of patient's notes indicated that she has a history in 2009 of a vaginal hysterectomy with anterior and posterior repair as well as enterocele repair and colposuspension.Patient's last bone density study was in November 2014 demonstrated her lowest T score was -2.1 at the AP spine and her lowest femoral neck was -1.6 at the right hip. Patient had normal Frax analysis. Patient reports normal colonoscopy in 2004. No past history of any abnormal Pap smears. Her vaccines are up-to-date.   Past medical history,surgical history, family history and social history were all reviewed and documented in the EPIC chart.  Gynecologic History No LMP recorded. Patient has had a hysterectomy. Contraception: post menopausal status Last Pap: Several years ago. Results were: normal Last mammogram: October 2015. Results were: normal  Obstetric History OB History  Gravida Para Term Preterm AB SAB TAB Ectopic Multiple Living  2 2        2     # Outcome Date GA Lbr Len/2nd Weight Sex Delivery Anes PTL Lv  2 Para           1 Para                 ROS: A ROS was performed and pertinent positives and negatives are included in the history.  GENERAL: No fevers or chills. HEENT: No change in vision, no earache, sore throat or sinus congestion. NECK: No pain or stiffness. CARDIOVASCULAR: No chest pain or pressure. No palpitations. PULMONARY: No shortness of breath, cough or wheeze. GASTROINTESTINAL: No abdominal pain, nausea, vomiting or diarrhea, melena or bright red blood per rectum. GENITOURINARY: No urinary frequency, urgency, hesitancy or dysuria. MUSCULOSKELETAL: No joint or muscle pain, no back pain, no recent trauma. DERMATOLOGIC: No rash, no itching, no lesions. ENDOCRINE: No polyuria, polydipsia, no heat or cold intolerance. No recent change in weight. HEMATOLOGICAL: No anemia or easy bruising or bleeding. NEUROLOGIC: No headache, seizures, numbness, tingling or weakness. PSYCHIATRIC: No depression, no loss of interest in normal activity or change in sleep pattern.     Exam: chaperone present  BP 126/74 mmHg  Ht 5' 8.5" (1.74 m)  Wt 136 lb (61.689 kg)  BMI 20.38 kg/m2  Body mass index is 20.38 kg/(m^2).  General appearance : Well developed well nourished female. No acute distress HEENT: Eyes: no retinal hemorrhage or exudates,  Neck supple, trachea midline, no carotid bruits, no thyroidmegaly Lungs: Clear to auscultation, no rhonchi or wheezes, or rib retractions  Heart: Regular rate and rhythm, no murmurs or gallops Breast:Examined in sitting and supine position were symmetrical  in appearance, no palpable masses or tenderness,  no skin retraction, no nipple inversion, no nipple discharge, no skin discoloration, no axillary or supraclavicular lymphadenopathy Abdomen: no palpable masses or tenderness, no rebound or guarding Extremities: no edema or skin discoloration or tenderness  Pelvic:  Bartholin, Urethra, Skene Glands: Within normal limits             Vagina: No gross lesions or discharge, atrophic changes  Cervix:  No gross lesions or discharge  Uterus  axial, normal size, shape and consistency, non-tender and mobile  Adnexa  Without masses or tenderness  Anus and perineum  normal   Rectovaginal  normal sphincter tone without palpated masses or tenderness             Hemoccult PCP provides     Assessment/Plan:  69 y.o. female for annual exam patient with symptomatic vaginal atrophy contributing to dyspareunia despite being on vaginal estrogen twice a week. She is going to discontinue the vaginal estrogen. I have prescribed Osphena 60 mg which she would take daily. The risk benefits and pros and cons of the medication were discussed with the patient and literature information was provided. Later this year patient needs to have her bone density study performed. I'm also going to refer to the sleep apnea clinic for follow-up because I'm concerned that she's been taking trazodone 150 mg daily at bedtime for quite some time. I will prescribe the next 0.25 mg for to take 1 by mouth daily on a when necessary basis in the event of anxiety. I've also given the name of the dermatologist for follow-up as a result of losing hair in her eyebrow.   Terrance Mass MD, 11:59 AM 11/08/2014

## 2014-11-09 ENCOUNTER — Telehealth: Payer: Self-pay | Admitting: *Deleted

## 2014-11-09 ENCOUNTER — Ambulatory Visit (INDEPENDENT_AMBULATORY_CARE_PROVIDER_SITE_OTHER)
Admission: RE | Admit: 2014-11-09 | Discharge: 2014-11-09 | Disposition: A | Discharge status: Home / Self Care | Payer: Commercial Managed Care - HMO | Source: Ambulatory Visit | Attending: Cardiovascular Disease | Admitting: Cardiovascular Disease

## 2014-11-09 DIAGNOSIS — R079 Chest pain, unspecified: Secondary | ICD-10-CM

## 2014-11-09 DIAGNOSIS — R0602 Shortness of breath: Secondary | ICD-10-CM

## 2014-11-09 LAB — VITAMIN D 25 HYDROXY (VIT D DEFICIENCY, FRACTURES): Vit D, 25-Hydroxy: 34 ng/mL (ref 30–100)

## 2014-11-09 MED ORDER — ALPRAZOLAM 0.25 MG PO TABS: 0.2500 mg | ORAL_TABLET | Freq: Every evening | ORAL | Status: DC | PRN

## 2014-11-09 NOTE — Telephone Encounter (Signed)
Pt was seen on Xanax 0.25 mg tablet from OV 11/09/14 was never called in, this was done today. Left on pt voicemail this has been done.

## 2014-11-10 ENCOUNTER — Ambulatory Visit
Payer: Commercial Managed Care - HMO | Attending: Neurology | Admitting: Rehabilitative and Restorative Service Providers"

## 2014-11-10 DIAGNOSIS — G609 Hereditary and idiopathic neuropathy, unspecified: Secondary | ICD-10-CM

## 2014-11-10 NOTE — Therapy (Signed)
[facility] [address] [place], [place], [address] Phone: [phone]   Fax:  [phone]  Patient Details  Name: [name] MRN: [id number] Date of Birth: 04-23-1945 Referring Provider:  Kathrynn Ducking, MD  Encounter Date: 11/10/2014  Evaluation not completed.      Subjective Assessment - 11/10/14 1242    Subjective The patient reports that she is here for heat therapy.  She does not wish to participate in exercise or stretching, but more interested in anodyne therapy.  PT to contact MD and have referral sent to another clinic.       Jomaira Darr 11/10/2014, 1:12 PM  [facility] [address] [place], [place], [address] Phone: [phone]   Fax:  [phone]

## 2014-11-13 ENCOUNTER — Telehealth: Payer: Self-pay

## 2014-11-13 ENCOUNTER — Telehealth: Payer: Self-pay | Admitting: Neurology

## 2014-11-13 DIAGNOSIS — G609 Hereditary and idiopathic neuropathy, unspecified: Secondary | ICD-10-CM

## 2014-11-13 NOTE — Telephone Encounter (Signed)
Pt states she has a couple question regarding her CT scan. States she does not understand "what all they are doing" . Please call.

## 2014-11-13 NOTE — Telephone Encounter (Signed)
Patient is calling and states that she needs a referral for heat therapy for her neuropathy pain.  She is stating that [facility] where she was sent does not do this procedure and they told her it can be done at [facility]. The patient needs a new referral please.  Thanks!

## 2014-11-13 NOTE — Telephone Encounter (Signed)
Patient would like to be called back one way or the other regarding appointment to be set up at [facility]. [phone].

## 2014-11-13 NOTE — Telephone Encounter (Signed)
I will make the referral to [facility] therapy for  peripheral neuropathy.

## 2014-11-13 NOTE — Telephone Encounter (Signed)
Patient is calling back about appointment that was supposed to be set up at [facility]. Patient was advised that they can't do anything without a referral.

## 2014-11-13 NOTE — Telephone Encounter (Signed)
I called the patient. We do not have any documentation of discussing or sending a sleep referral. She stated she had the doctors confused. It was her gynecologist who she had discussed the sleep referral with. She is going to call their office.

## 2014-11-15 NOTE — Telephone Encounter (Signed)
Pt calling again asking if we can call her about CT results, she had a few questions. Please call.

## 2014-11-15 NOTE — Telephone Encounter (Signed)
Spoke w/ pt.  Reviewed the results in detail w/ pt.  She is appreciative and will call back w/ any questions or concerns.

## 2014-11-16 ENCOUNTER — Telehealth: Payer: Self-pay

## 2014-11-16 NOTE — Telephone Encounter (Signed)
There is a question about needing an order for a referral we recently sent to [facility]. I called to clarify and left a voicemail asking someone to call me back.

## 2014-11-20 ENCOUNTER — Other Ambulatory Visit: Payer: Self-pay | Admitting: Neurology

## 2014-11-20 DIAGNOSIS — G609 Hereditary and idiopathic neuropathy, unspecified: Secondary | ICD-10-CM

## 2014-11-23 NOTE — Telephone Encounter (Signed)
I called and left another voicemail asking for someone to call me back.

## 2014-11-29 ENCOUNTER — Telehealth: Payer: Self-pay | Admitting: *Deleted

## 2014-11-29 DIAGNOSIS — G4700 Insomnia, unspecified: Secondary | ICD-10-CM

## 2014-11-29 NOTE — Telephone Encounter (Signed)
Message was never sent to me to help schedule appointment for sleep clinic per note on 11/08/14. I called the [facility] sleep clinic and was informed patient will need to see pulmonary physician first and they will determine if sleep study needed. Appointment on 04/03/15 with Dr.Young at [facility] pt aware with time and date.

## 2014-12-04 NOTE — Telephone Encounter (Signed)
Looks like the referral was not sent to [facility]. It was sent to [facility] and [facility]. I called the patient to find out if she has been getting PT or if I need to send referral to [facility]. Left voicemail asking her to call me back.

## 2014-12-05 ENCOUNTER — Encounter: Payer: Self-pay | Admitting: Internal Medicine

## 2014-12-05 ENCOUNTER — Other Ambulatory Visit (INDEPENDENT_AMBULATORY_CARE_PROVIDER_SITE_OTHER): Payer: Commercial Managed Care - HMO

## 2014-12-05 ENCOUNTER — Ambulatory Visit (INDEPENDENT_AMBULATORY_CARE_PROVIDER_SITE_OTHER): Payer: Commercial Managed Care - HMO | Admitting: Internal Medicine

## 2014-12-05 VITALS — BP 108/74 | HR 65 | Temp 97.7°F | Resp 16 | Ht 68.5 in | Wt 132.0 lb

## 2014-12-05 DIAGNOSIS — R944 Abnormal results of kidney function studies: Secondary | ICD-10-CM | POA: Diagnosis not present

## 2014-12-05 DIAGNOSIS — E559 Vitamin D deficiency, unspecified: Secondary | ICD-10-CM | POA: Insufficient documentation

## 2014-12-05 DIAGNOSIS — D649 Anemia, unspecified: Secondary | ICD-10-CM

## 2014-12-05 DIAGNOSIS — Z0000 Encounter for general adult medical examination without abnormal findings: Secondary | ICD-10-CM

## 2014-12-05 DIAGNOSIS — E785 Hyperlipidemia, unspecified: Secondary | ICD-10-CM

## 2014-12-05 DIAGNOSIS — Z23 Encounter for immunization: Secondary | ICD-10-CM

## 2014-12-05 DIAGNOSIS — E876 Hypokalemia: Secondary | ICD-10-CM | POA: Diagnosis not present

## 2014-12-05 LAB — LIPID PANEL
CHOLESTEROL: 211 mg/dL — AB (ref 0–200)
HDL: 76.8 mg/dL (ref >39.00)
LDL Cholesterol: 122 mg/dL — ABNORMAL HIGH (ref 0–99)
NonHDL: 134.61
Total CHOL/HDL Ratio: 3
Triglycerides: 65 mg/dL (ref 0.0–149.0)
VLDL: 13 mg/dL (ref 0.0–40.0)

## 2014-12-05 LAB — CBC WITH DIFFERENTIAL/PLATELET
Basophils Absolute: 0 10*3/uL (ref 0.0–0.1)
Basophils Relative: 0.6 % (ref 0.0–3.0)
Eosinophils Absolute: 0 10*3/uL (ref 0.0–0.7)
Eosinophils Relative: 0.7 % (ref 0.0–5.0)
HCT: 40.4 % (ref 36.0–46.0)
Hemoglobin: 13.6 g/dL (ref 12.0–15.0)
Lymphocytes Relative: 23.5 % (ref 12.0–46.0)
Lymphs Abs: 1.8 10*3/uL (ref 0.7–4.0)
MCHC: 33.7 g/dL (ref 30.0–36.0)
MCV: 90.3 fl (ref 78.0–100.0)
Monocytes Absolute: 0.4 10*3/uL (ref 0.1–1.0)
Monocytes Relative: 5.1 % (ref 3.0–12.0)
Neutro Abs: 5.2 10*3/uL (ref 1.4–7.7)
Neutrophils Relative %: 70.1 % (ref 43.0–77.0)
Platelets: 259 10*3/uL (ref 150.0–400.0)
RBC: 4.47 Mil/uL (ref 3.87–5.11)
RDW: 14.8 % (ref 11.5–15.5)
WBC: 7.5 10*3/uL (ref 4.0–10.5)

## 2014-12-05 LAB — BASIC METABOLIC PANEL WITH GFR
BUN: 8 mg/dL (ref 6–23)
CO2: 28 meq/L (ref 19–32)
Calcium: 9.5 mg/dL (ref 8.4–10.5)
Chloride: 104 meq/L (ref 96–112)
Creatinine, Ser: 0.63 mg/dL (ref 0.40–1.20)
GFR: 99.07 mL/min
Glucose, Bld: 95 mg/dL (ref 70–99)
Potassium: 4 meq/L (ref 3.5–5.1)
Sodium: 139 meq/L (ref 135–145)

## 2014-12-05 NOTE — Assessment & Plan Note (Signed)
BMET 

## 2014-12-05 NOTE — Patient Instructions (Addendum)
Please take vitamin D3 1000 international units daily.  Your results will be mailed to you; critical values will be called

## 2014-12-05 NOTE — Assessment & Plan Note (Signed)
Vitamin D3 1000 IU qd

## 2014-12-05 NOTE — Progress Notes (Signed)
Pre visit review using our clinic review tool, if applicable. No additional management support is needed unless otherwise documented below in the visit note. 

## 2014-12-05 NOTE — Assessment & Plan Note (Signed)
CBC

## 2014-12-05 NOTE — Progress Notes (Signed)
   Subjective:    Patient ID: [name], female    DOB: [birth year], 69 y.o.   MRN: [id number]  HPI Medicare Wellness Visit: Psychosocial and medical history were reviewed as required by Medicare (history related to abuse, antisocial behavior , firearm risk). Social history: Caffeine: 1-2 cups per day Alcohol:  None Tobacco use: Never Exercise: As yardwork Personal safety/fall risk: No Limitations of activities of daily living: No Seatbelt/ smoke alarm use: Yes Healthcare Power of Attorney/Living Will status and End of Life process assessment : Yes Ophthalmologic exam status: Up-to-date Hearing evaluation status: Up-to-date Orientation: Oriented X 3 Memory and recall: Good Spelling or math testing: World spelled backwards Depression/anxiety assessment: No Foreign travel history: Never Immunization status for influenza/pneumonia/ shingles /tetanus: As per CMA Transfusion history: Never Preventive health care maintenance status: Colonoscopy/BMD/mammogram/as per protocol/standard care: Up-to-date Dental care: At least every 6 months Chart reviewed and updated. Active issues reviewed and addressed as documented below.   She has been compliant with her medications without adverse effects she is on a modified heart healthy diet but does eat some salt because she tends to be hypotensive. Exercise consisted yardwork.   Positive review of systems include thin stools if her colitis is active.  She has occasional fast beats and skipping of the heart; she's followed by a Cardiologist.  She has numbness, tingling, and burning in her feet. She sees Dr. Jannifer Franklin, Neurologist.  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 She also has cold intolerance. Her most recent TSH was 2.37 on 08/09/14.  Her potassium was 3.4 and GFR 89 on 05/30/14. Also on that date she was found to be anemic with hemoglobin 11.9 and hematocrit 35.6. Her vitamin D level was decreased at  34 on 11/08/14. She was unaware of this; she is not on a vitamin D supplement..  Review of Systems  Chest pain, exertional dyspnea, paroxysmal nocturnal dyspnea, claudication or edema are absent. No unexplained weight loss, abdominal pain, significant dyspepsia, dysphagia, melena, or rectal bleeding. Dysuria, pyuria, hematuria, frequency, nocturia or polyuria are denied. Change in skin or nails denied. No bowel changes of constipation or diarrhea. No intolerance to heat .     Objective:   Physical Exam  Pertinent or positive findings include: There is marked thinning of eyebrows. She has decreased range of motion of the cervical spine. Thyroid is small to palpation. Deep tendon reflexes are 0+ at the knees. The posterior tibial pulses are slightly decreased.  General appearance :Thin but adequately nourished; in no distress.  Eyes: No conjunctival inflammation or scleral icterus is present.  Oral exam:  Lips and gums are healthy appearing.There is no oropharyngeal erythema or exudate noted. Dental hygiene is good.  Heart:  Normal rate and regular rhythm. S1 and S2 normal without gallop, murmur, click, rub or other extra sounds    Lungs:Chest clear to auscultation; no wheezes, rhonchi,rales ,or rubs present.No increased work of breathing.   Abdomen: bowel sounds normal, soft and non-tender without masses, organomegaly or hernias noted.  No guarding or rebound.   Vascular : all pulses equal ; no bruits present.  Skin:Warm & dry.  Intact without suspicious lesions or rashes ; no tenting or jaundice   Lymphatic: No lymphadenopathy is noted about the head, neck, axilla.   Neuro: Strength, tone normal.    Assessment & Plan:  See Current Assessment & Plan in Problem List under specific Diagnosis

## 2014-12-05 NOTE — Telephone Encounter (Signed)
Left voicemail asking patient to call back about a question regarding her PT.

## 2014-12-06 ENCOUNTER — Encounter: Payer: 59 | Admitting: Internal Medicine

## 2014-12-06 NOTE — Telephone Encounter (Signed)
Pt called stating [facility] for PT did not do heat therapy but they would do water therapy but her ins did not cover water therapy. She has not had any PT as to date. Pt really would like to have heat therapy. Please call and advise at [phone]

## 2014-12-06 NOTE — Telephone Encounter (Signed)
Referral sent to [facility]. I called the patient to let her know. I also gave her their number [phone]) so she could call if she hasn't heard from them in a few days.

## 2014-12-12 ENCOUNTER — Ambulatory Visit: Payer: Commercial Managed Care - HMO | Admitting: Physical Therapy

## 2014-12-19 ENCOUNTER — Telehealth: Payer: Self-pay | Admitting: *Deleted

## 2014-12-19 NOTE — Telephone Encounter (Signed)
Pt Rx for trazodone 150 mg was never called in to pharmacy on 11/08/14, I called Rx in.

## 2014-12-21 ENCOUNTER — Encounter: Payer: Commercial Managed Care - HMO | Admitting: Physical Therapy

## 2014-12-26 ENCOUNTER — Encounter: Payer: Commercial Managed Care - HMO | Admitting: Physical Therapy

## 2014-12-28 ENCOUNTER — Encounter: Payer: Commercial Managed Care - HMO | Admitting: Physical Therapy

## 2014-12-28 ENCOUNTER — Encounter: Payer: Self-pay | Admitting: Gynecology

## 2015-03-21 ENCOUNTER — Ambulatory Visit: Payer: Commercial Managed Care - HMO | Admitting: Nurse Practitioner

## 2015-04-03 ENCOUNTER — Institutional Professional Consult (permissible substitution): Payer: Commercial Managed Care - HMO | Admitting: Internal Medicine

## 2015-04-06 ENCOUNTER — Telehealth: Payer: Self-pay | Admitting: *Deleted

## 2015-04-06 NOTE — Telephone Encounter (Signed)
Spoke w/ pt.  She reports that her grandson was in a wreck about 3 weeks ago and she is helping care for him. She is under a considerable amount of stress and feels that her heart is acting up. Dr. Rockey Situ was present and reviewed pt's recent results, advised that sx are not cardiac.  Pt admits to considerable anxiety and has not been taking her Xanax. She reports that her husband is her only support system and he is at work right now. Advised pt to take 1/2 xanax and to relax. Offered prayers for pt and her family, she is considerably calmer at the end of our conversation. She is appreciative of the call and will call back if we can be of further assistance.

## 2015-04-06 NOTE — Telephone Encounter (Signed)
Pt calling stating that her heart will speed up and then slow down, has sharp pains across her back.  Does not want to go to ED  States that her grandson was in an accident and not sure if that is bothering her.  Sounds a bit of SOB

## 2015-04-17 ENCOUNTER — Ambulatory Visit: Payer: Commercial Managed Care - HMO | Admitting: Neurology

## 2015-04-24 ENCOUNTER — Telehealth: Payer: Self-pay | Admitting: Internal Medicine

## 2015-04-24 NOTE — Telephone Encounter (Signed)
Patient Name: [name] [facility] DOB: 09-Nov-1945 Initial Comment Caller states she's having shortness of breath, and pain in her back. Also having chest pain. Dizziness. Nurse Assessment Nurse: Marcelline Deist, RN, Lynda Date/Time (Eastern Time): 04/24/2015 10:52:25 AM Confirm and document reason for call. If symptomatic, describe symptoms. You must click the next button to save text entered. ---Caller states she's having shortness of breath, and pain in her back. Also having chest pain. Dizziness. Was seen by heart Dr. in October. No blockages. Has been very tired. Has had symptoms for about 5 weeks. Has the patient traveled out of the country within the last 30 days? ---Not Applicable Does the patient have any new or worsening symptoms? ---Yes Will a triage be completed? ---Yes Related visit to physician within the last 2 weeks? ---No Does the PT have any chronic conditions? (i.e. diabetes, asthma, etc.) ---Yes List chronic conditions. ---neuropathy Is this a behavioral health or substance abuse call? ---No Guidelines Guideline Title Affirmed Question Affirmed Notes Chest Pain [1] Chest pain lasts > 5 minutes AND [2] described as crushing, pressure-like, or heavy Final Disposition User Call EMS 911 Now Marcelline Deist, RN, Lynda Comments Caller states she has an appt. tomorrow with Dr. Elna Breslow. Feels she will be fine until then, does not plan to go to the ER, or call EMS at this time as advised. Disagree/Comply: Disagree Disagree/Comply Reason: Disagree with instructions

## 2015-04-26 ENCOUNTER — Other Ambulatory Visit (INDEPENDENT_AMBULATORY_CARE_PROVIDER_SITE_OTHER): Payer: Commercial Managed Care - HMO

## 2015-04-26 ENCOUNTER — Encounter: Payer: Self-pay | Admitting: Family

## 2015-04-26 ENCOUNTER — Ambulatory Visit (INDEPENDENT_AMBULATORY_CARE_PROVIDER_SITE_OTHER): Payer: Commercial Managed Care - HMO | Admitting: Family

## 2015-04-26 VITALS — BP 118/86 | HR 65 | Temp 97.9°F | Resp 18 | Ht 68.5 in | Wt 139.0 lb

## 2015-04-26 DIAGNOSIS — R5383 Other fatigue: Secondary | ICD-10-CM

## 2015-04-26 DIAGNOSIS — R079 Chest pain, unspecified: Secondary | ICD-10-CM | POA: Insufficient documentation

## 2015-04-26 DIAGNOSIS — R0789 Other chest pain: Secondary | ICD-10-CM

## 2015-04-26 LAB — CBC
HCT: 38.9 % (ref 36.0–46.0)
Hemoglobin: 13 g/dL (ref 12.0–15.0)
MCHC: 33.4 g/dL (ref 30.0–36.0)
MCV: 89.5 fl (ref 78.0–100.0)
PLATELETS: 237 10*3/uL (ref 150.0–400.0)
RBC: 4.34 Mil/uL (ref 3.87–5.11)
RDW: 13.9 % (ref 11.5–15.5)
WBC: 8.2 10*3/uL (ref 4.0–10.5)

## 2015-04-26 LAB — IBC PANEL
IRON: 76 ug/dL (ref 42–145)
SATURATION RATIOS: 19.5 % — AB (ref 20.0–50.0)
TRANSFERRIN: 278 mg/dL (ref 212.0–360.0)

## 2015-04-26 LAB — TSH: TSH: 3.45 u[IU]/mL (ref 0.35–4.50)

## 2015-04-26 LAB — FERRITIN: FERRITIN: 51.8 ng/mL (ref 10.0–291.0)

## 2015-04-26 NOTE — Progress Notes (Signed)
Pre visit review using our clinic review tool, if applicable. No additional management support is needed unless otherwise documented below in the visit note. 

## 2015-04-26 NOTE — Progress Notes (Signed)
Subjective:    Patient ID: [name], female    DOB: [birth year], 70 y.o.   MRN: [id number]  Chief Complaint  Patient presents with  . Back Pain    had a grandson that got in an accident, since then she had some SOB, chest pain, and back pain, was told by her heart doctor that she was having a panic attack    HPI:  [name] is a 70 y.o. female who  has a past medical history of Diverticulosis; Gastroesophageal reflux disease with hiatal hernia; IBS (irritable bowel syndrome); Hyperlipidemia; Spondylosis; Migraine headache; Schatzki's ring; Osteopenia; Mitral valve prolapse; Depression; Hiatal hernia; Fibromyalgia; Peripheral neuropathy ([place]); Lymphocytic colitis; Duodenal diverticulum; Anxiety; Restless leg syndrome; Complication of anesthesia; PONV (postoperative nausea and vomiting); Family history of adverse reaction to anesthesia; Sleep apnea; History of kidney stones; and Neuropathy ([place]). and presents today for an office visit.   1.) Chest pain - This is new problem. Associated symptoms of chest pain, fatigue and shortness of breath which has been going on for about a month following the news she received that her grandson was in motorcross accident which he has remained in the hospital since. Chest pain is described as achy with some of the sharpness wearing off. The other symptoms have not improved very much. Denies any modifying treatments that make her symptoms better or worse. She does get short of breath with climbing stairs, but not on a regular basis.   Allergies  Allergen Reactions  . Adhesive [Tape] Rash  . Lyrica [Pregabalin] Other (See Comments)    Sedation  . Neurontin [Gabapentin] Other (See Comments)    Sedation  . Nitrofuran Derivatives Other (See Comments)    "tingling"     Current Outpatient Prescriptions on File Prior to Visit  Medication Sig Dispense Refill  . ALPRAZolam (XANAX) 0.25 MG tablet Take 1 tablet (0.25 mg total) by mouth at  bedtime as needed for anxiety. 30 tablet 5  . b complex vitamins capsule Take 1 capsule by mouth daily.    . Biotin 1000 MCG tablet Take 1,000 mcg by mouth daily.      . Magnesium Oxide 250 MG TABS Take 250 mg by mouth daily.     . Omega-3 Fatty Acids (FISH OIL) 1000 MG CAPS Take 1,000 mg by mouth daily.    . Probiotic Product (ALIGN) 4 MG CAPS Take 1 capsule by mouth daily.     . sodium chloride (OCEAN) 0.65 % SOLN nasal spray Place 1 spray into both nostrils as needed for congestion.    . traZODone (DESYREL) 150 MG tablet Take 1-2 tablets by mouth at bedtime 30 tablet 5  . vitamin C (ASCORBIC ACID) 500 MG tablet Take 500 mg by mouth daily.    . [DISCONTINUED] colesevelam (WELCHOL) 625 MG tablet Take 625 mg by mouth 2 (two) times daily with a meal. Take one tablet by mouth twice a day    . [DISCONTINUED] Hyoscyamine-Phenyltoloxamine ([place] NF) [id number] MG CAPS Take one tablet by mouth as needed for abdominal cramping 24 each 0   No current facility-administered medications on file prior to visit.     Past Surgical History  Procedure Laterality Date  . Tubal ligation    . Cholecystectomy    . Vaginal cystectomy      x 2  . Rotator cuff repair Left     left  . Sinus surgery with instatrak      x 2  . Esophageal dilation  X 2  . Breast enhancement surgery    . Cataract surgery Left     Dr Bing Plume  . Colonoscopy    . Eye surgery      cataract  . Vaginal hysterectomy  05/2007    Vaginal repair, Dr Ubaldo Glassing.  Partial  hysterectomy.  . Sinus endo w/fusion N/A 05/30/2014    Procedure: REVISION  FRONTAL SINUS SURGERY WITH FUSION SCAN;  Surgeon: Jerrell Belfast, MD;  Location: [facility];  Service: ENT;  Laterality: N/A;    Past Medical History  Diagnosis Date  . Diverticulosis   . Gastroesophageal reflux disease with hiatal hernia   . IBS (irritable bowel syndrome)   . Hyperlipidemia   . Spondylosis   . Migraine headache   . Schatzki's ring     History of  . Osteopenia     BMD  ordered by GYN  . Mitral valve prolapse   . Depression     generalized anxiety disorder  . Hiatal hernia   . Fibromyalgia   . Peripheral neuropathy ([place])     treated as RLS by  Neurology  . Lymphocytic colitis     Dr Sharlett Iles  . Duodenal diverticulum   . Anxiety   . Restless leg syndrome   . Complication of anesthesia   . PONV (postoperative nausea and vomiting)   . Family history of adverse reaction to anesthesia     Mother and Daughters- N/V  . Sleep apnea     On CPAP, has not been using  . History of kidney stones   . Neuropathy ([place])      Review of Systems  Constitutional: Negative for fever and chills.  Respiratory: Positive for shortness of breath. Negative for chest tightness.   Cardiovascular: Positive for chest pain. Negative for palpitations and leg swelling.  Neurological: Negative for headaches.  Psychiatric/Behavioral: The patient is nervous/anxious.       Objective:    BP 118/86 mmHg  Pulse 65  Temp(Src) 97.9 F (36.6 C) (Oral)  Resp 18  Ht 5' 8.5" (1.74 m)  Wt 139 lb (63.05 kg)  BMI 20.83 kg/m2  SpO2 94% Nursing note and vital signs reviewed.  Physical Exam  Constitutional: She is oriented to person, place, and time. She appears well-developed and well-nourished. No distress.  Cardiovascular: Normal rate, regular rhythm, normal heart sounds and intact distal pulses.   Pulmonary/Chest: Effort normal and breath sounds normal.  Neurological: She is alert and oriented to person, place, and time.  Skin: Skin is warm and dry. There is pallor.  Psychiatric: She has a normal mood and affect. Her behavior is normal. Judgment and thought content normal.       Assessment & Plan:   Problem List Items Addressed This Visit      Other   Fatigue     Fatigue of undetermined origin with concern for anxiety/depression secondary to recent family events. Obtain TSH, methylmalonic acid, IBC panel, ferritin, and CBC to rule out metabolic causes. Cannot rule out  underlying anxiety/depression. Follow-up if symptoms worsen or fail to improve prior to blood work.      Relevant Orders   CBC   IBC panel   Ferritin   TSH   Methylmalonic Acid   Chest pain - Primary     In office EKG shows normal sinus rhythm. Chest pain unlikely from cardiac causes and most likely related to panic attack or anxiety. Continue current dosage of alprazolam for anxiety as needed. May require additional medication if metabolic  profile for fatigue is negative.      Relevant Orders   EKG 12-Lead (Completed)

## 2015-04-26 NOTE — Assessment & Plan Note (Signed)
In office EKG shows normal sinus rhythm. Chest pain unlikely from cardiac causes and most likely related to panic attack or anxiety. Continue current dosage of alprazolam for anxiety as needed. May require additional medication if metabolic profile for fatigue is negative.

## 2015-04-26 NOTE — Assessment & Plan Note (Signed)
Fatigue of undetermined origin with concern for anxiety/depression secondary to recent family events. Obtain TSH, methylmalonic acid, IBC panel, ferritin, and CBC to rule out metabolic causes. Cannot rule out underlying anxiety/depression. Follow-up if symptoms worsen or fail to improve prior to blood work.

## 2015-04-26 NOTE — Patient Instructions (Signed)
Thank you for choosing [facility].  Summary/Instructions:  Please continue to take your medications as prescribed.   Please stop by the lab on the basement level of the building for your blood work. Your results will be released to [place] (or called to you) after review, usually within 72 hours after test completion. If any changes need to be made, you will be notified at that same time.  If your symptoms worsen or fail to improve, please contact our office for further instruction, or in case of emergency go directly to the emergency room at the closest medical facility.   General Recommendations:    Please drink plenty of fluids.  Get plenty of rest   Sleep in humidified air  Use saline nasal sprays  Netti pot   OTC Medications:  Decongestants - helps relieve congestion   Flonase (generic fluticasone) or Nasacort (generic triamcinolone) - please make sure to use the "cross-over" technique at a 45 degree angle towards the opposite eye as opposed to straight up the nasal passageway.   Sudafed (generic pseudoephedrine - Note this is the one that is available behind the pharmacy counter); Products with phenylephrine (-PE) may also be used but is often not as effective as pseudoephedrine.   If you have HIGH BLOOD PRESSURE - Coricidin HBP; AVOID any product that is -D as this contains pseudoephedrine which may increase your blood pressure.  Afrin (oxymetazoline) every 6-8 hours for up to 3 days.   Allergies - helps relieve runny nose, itchy eyes and sneezing   Claritin (generic loratidine), Allegra (fexofenidine), or Zyrtec (generic cyrterizine) for runny nose. These medications should not cause drowsiness.  Note - Benadryl (generic diphenhydramine) may be used however may cause drowsiness  Cough -   Delsym or Robitussin (generic dextromethorphan)  Expectorants - helps loosen mucus to ease removal   Mucinex (generic guaifenesin) as directed on the  package.  Headaches / General Aches   Tylenol (generic acetaminophen) - DO NOT EXCEED 3 grams (3,000 mg) in a 24 hour time period  Advil/Motrin (generic ibuprofen)   Sore Throat -   Salt water gargle   Chloraseptic (generic benzocaine) spray or lozenges / Sucrets (generic dyclonine)

## 2015-04-29 ENCOUNTER — Telehealth: Payer: Self-pay | Admitting: Family

## 2015-04-29 LAB — METHYLMALONIC ACID, SERUM: METHYLMALONIC ACID, QUANT: 126 nmol/L (ref 87–318)

## 2015-04-29 NOTE — Telephone Encounter (Signed)
Please inform patient that her thyroid function, B12 and iron levels are all within the normal limits. Therefore her symptoms may be an exacerbations of her depression related to her current situation.

## 2015-05-01 ENCOUNTER — Ambulatory Visit: Payer: Commercial Managed Care - HMO | Admitting: Adult Health

## 2015-05-01 NOTE — Telephone Encounter (Signed)
Pt aware of results 

## 2015-05-02 ENCOUNTER — Encounter: Payer: Self-pay | Admitting: Adult Health

## 2015-05-17 ENCOUNTER — Other Ambulatory Visit: Payer: Self-pay | Admitting: Gynecology

## 2015-05-17 ENCOUNTER — Ambulatory Visit (INDEPENDENT_AMBULATORY_CARE_PROVIDER_SITE_OTHER): Payer: Commercial Managed Care - HMO

## 2015-05-17 DIAGNOSIS — M899 Disorder of bone, unspecified: Secondary | ICD-10-CM | POA: Diagnosis not present

## 2015-05-17 DIAGNOSIS — M8580 Other specified disorders of bone density and structure, unspecified site: Secondary | ICD-10-CM

## 2015-05-22 ENCOUNTER — Other Ambulatory Visit: Payer: Self-pay | Admitting: Gynecology

## 2015-05-22 DIAGNOSIS — M8580 Other specified disorders of bone density and structure, unspecified site: Secondary | ICD-10-CM

## 2015-06-11 ENCOUNTER — Other Ambulatory Visit: Payer: Commercial Managed Care - HMO

## 2015-06-14 ENCOUNTER — Other Ambulatory Visit: Payer: Commercial Managed Care - HMO

## 2015-06-20 ENCOUNTER — Other Ambulatory Visit: Payer: Commercial Managed Care - HMO

## 2015-06-21 ENCOUNTER — Other Ambulatory Visit: Payer: Commercial Managed Care - HMO

## 2015-06-21 DIAGNOSIS — M8580 Other specified disorders of bone density and structure, unspecified site: Secondary | ICD-10-CM

## 2015-06-22 ENCOUNTER — Other Ambulatory Visit: Payer: Self-pay | Admitting: Gynecology

## 2015-06-22 DIAGNOSIS — E559 Vitamin D deficiency, unspecified: Secondary | ICD-10-CM

## 2015-06-22 LAB — VITAMIN D 25 HYDROXY (VIT D DEFICIENCY, FRACTURES): VIT D 25 HYDROXY: 26 ng/mL — AB (ref 30–100)

## 2015-06-22 MED ORDER — VITAMIN D (ERGOCALCIFEROL) 1.25 MG (50000 UNIT) PO CAPS: 50000.0000 [IU] | ORAL_CAPSULE | ORAL | Status: DC

## 2015-06-29 ENCOUNTER — Ambulatory Visit (INDEPENDENT_AMBULATORY_CARE_PROVIDER_SITE_OTHER): Payer: Commercial Managed Care - HMO | Admitting: Internal Medicine

## 2015-06-29 ENCOUNTER — Encounter: Payer: Self-pay | Admitting: Internal Medicine

## 2015-06-29 VITALS — BP 100/66 | HR 65 | Temp 98.1°F | Resp 16 | Wt 133.0 lb

## 2015-06-29 DIAGNOSIS — F418 Other specified anxiety disorders: Secondary | ICD-10-CM | POA: Diagnosis not present

## 2015-06-29 DIAGNOSIS — G4700 Insomnia, unspecified: Secondary | ICD-10-CM | POA: Diagnosis not present

## 2015-06-29 MED ORDER — FLUOXETINE HCL 10 MG PO CAPS: 10.0000 mg | ORAL_CAPSULE | Freq: Every day | ORAL | Status: DC

## 2015-06-29 NOTE — Progress Notes (Signed)
Pre visit review using our clinic review tool, if applicable. No additional management support is needed unless otherwise documented below in the visit note. 

## 2015-06-29 NOTE — Progress Notes (Signed)
Subjective:    Patient ID: [name], female    DOB: [birth year], 70 y.o.   MRN: [id number]  HPI She is here for depression.  Her grandson was in a bad Accident in January, which may have been the start of her depression. There have been several other family issues as well since then and she is to the point where she cannot handle anything else.   She feels depressed.  She has no energy to do anything.  She feels lifeless.  She has memory difficulties recently and knows that may be related to her depression.  She has anhedonia.  She loves to work in the yard and keep a nice home - she does not feel like doing either.  She feels zoned out.    She has had depression in the past. She believes she was on Zoloft at one point and did not tolerate the medication well.  She is currently taking trazodone for sleep.  She is unsure if it helps.   Anxiety, panic attacks:  She takes a half of a xanax as needed.  It makes her feel spaced out.  She has not taken one in a while because of that.    She is a private person and does not feel therapy will help.    She has had some difficulty eating-she just doesn't have the appetite and has to force herself. She states some nausea, palpitations, shortness of breath, chest pain and lightheadedness. She relates all of these symptoms to the depression  Or anxiety.  Medications and allergies reviewed with patient and updated if appropriate.  Patient Active Problem List   Diagnosis Date Noted  . Chest pain 04/26/2015  . Anemia 12/05/2014  . Vitamin D deficiency 12/05/2014  . Sinusitis, chronic 05/30/2014    Class: Chronic  . Tick bite of right popliteal region 11/04/2013  . Hypokalemia 03/28/2013  . Vaginal atrophy 10/20/2012  . Insomnia 10/20/2012  . Orthostatic hypotension 02/16/2012  . Chronic LLQ pain 05/20/2011  . Cough 02/13/2011  . Palpitations 12/31/2010  . GERD (gastroesophageal reflux disease) 07/25/2010  . Depression with  anxiety 07/25/2010  . Lymphocytic colitis 06/14/2010  . Gastroesophageal reflux disease with hiatal hernia   . ANEMIA, MILD 01/15/2010  . BACK PAIN, CHRONIC 12/27/2009  . OSTEOPENIA 12/27/2009  . ABDOMINAL BRUIT 09/14/2009  . 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 Fatigue 06/25/2009  . DYSPNEA/SHORTNESS OF BREATH 06/25/2009  . CHEST PAIN-PRECORDIAL 12/12/2008  . Hereditary and idiopathic peripheral neuropathy 12/22/2007  . OTHER ABNORMAL GLUCOSE 12/22/2007  . Hyperlipidemia 03/09/2007  . Generalized anxiety disorder 03/09/2007  . MIGRAINE HEADACHE 03/09/2007  . IBS 03/09/2007  . SPONDYLOSIS 03/09/2007  . Myalgia and myositis 03/09/2007  . SCHATZKI'S RING, HX OF 03/09/2007  . HIATAL HERNIA WITH REFLUX 12/30/2006  . GASTRITIS 02/17/2005  . DIVERTICULOSIS OF COLON 01/01/2000    Current Outpatient Prescriptions on File Prior to Visit  Medication Sig Dispense Refill  . ALPRAZolam (XANAX) 0.25 MG tablet Take 1 tablet (0.25 mg total) by mouth at bedtime as needed for anxiety. 30 tablet 5  . b complex vitamins capsule Take 1 capsule by mouth daily.    . Biotin 1000 MCG tablet Take 1,000 mcg by mouth daily.      . Magnesium Oxide 250 MG TABS Take 250 mg by mouth daily.     . Omega-3 Fatty Acids (FISH OIL) 1000 MG CAPS Take 1,000 mg by mouth daily.    . Probiotic Product (  ALIGN) 4 MG CAPS Take 1 capsule by mouth daily.     . sodium chloride (OCEAN) 0.65 % SOLN nasal spray Place 1 spray into both nostrils as needed for congestion.    . traZODone (DESYREL) 150 MG tablet Take 1-2 tablets by mouth at bedtime 30 tablet 5  . vitamin C (ASCORBIC ACID) 500 MG tablet Take 500 mg by mouth daily.    . Vitamin D, Ergocalciferol, (DRISDOL) 50000 units CAPS capsule Take 1 capsule (50,000 Units total) by mouth every 7 (seven) days. 12 capsule 0  . [DISCONTINUED] colesevelam (WELCHOL) 625 MG tablet Take 625 mg by mouth 2 (two) times daily with a meal. Take one tablet by mouth twice  a day    . [DISCONTINUED] Hyoscyamine-Phenyltoloxamine ([place] NF) [id number] MG CAPS Take one tablet by mouth as needed for abdominal cramping 24 each 0   No current facility-administered medications on file prior to visit.    Past Medical History  Diagnosis Date  . Diverticulosis   . Gastroesophageal reflux disease with hiatal hernia   . IBS (irritable bowel syndrome)   . Hyperlipidemia   . Spondylosis   . Migraine headache   . Schatzki's ring     History of  . Osteopenia     BMD ordered by GYN  . Mitral valve prolapse   . Depression     generalized anxiety disorder  . Hiatal hernia   . Fibromyalgia   . Peripheral neuropathy ([place])     treated as RLS by  Neurology  . Lymphocytic colitis     Dr Sharlett Iles  . Duodenal diverticulum   . Anxiety   . Restless leg syndrome   . Complication of anesthesia   . PONV (postoperative nausea and vomiting)   . Family history of adverse reaction to anesthesia     Mother and Daughters- N/V  . Sleep apnea     On CPAP, has not been using  . History of kidney stones   . Neuropathy [facility])     Past Surgical History  Procedure Laterality Date  . Tubal ligation    . Cholecystectomy    . Vaginal cystectomy      x 2  . Rotator cuff repair Left     left  . Sinus surgery with instatrak      x 2  . Esophageal dilation      X 2  . Breast enhancement surgery    . Cataract surgery Left     Dr Bing Plume  . Colonoscopy    . Eye surgery      cataract  . Vaginal hysterectomy  05/2007    Vaginal repair, Dr Ubaldo Glassing.  Partial  hysterectomy.  . Sinus endo w/fusion N/A 05/30/2014    Procedure: REVISION  FRONTAL SINUS SURGERY WITH FUSION SCAN;  Surgeon: Jerrell Belfast, MD;  Location: [place];  Service: ENT;  Laterality: N/A;    Social History   Social History  . Marital Status: Married    Spouse Name: N/A  . Number of Children: 2  . Years of Education: 18   Occupational History  . retired [employer]   Social History Main Topics  . Smoking status:  Former Smoker -- 0.50 packs/day for 15 years    Types: Cigarettes    Quit date: 03/03/2004  . Smokeless tobacco: Never Used     Comment: smoked 1973- 2006, up to <  1  ppd  . Alcohol Use: No  . Drug Use: No  . Sexual  Activity: Yes   Other Topics Concern  . Not on file   Social History Narrative   HAS REGULAR EXERCISE   DAILY CAFFEINE: 1-2 CUPS   Patient is right handed.    Family History  Problem Relation Age of Onset  . Hypertension Father 61  . Stroke Father 28  . Heart attack Father      ? in 52s  . Hypertension Mother   . Neuropathy Mother   . Anemia Mother   . Coronary artery disease Brother     Stent placement in 60s  . Colon cancer Neg Hx   . Seizures Neg Hx   . Diabetes Maternal Uncle   . Heart attack Paternal Uncle     SEVEN , ? age    Review of Systems  Constitutional: Positive for unexpected weight change (? minimal weight loss). Negative for appetite change (decreased, forcing herself to eat).  Respiratory: Positive for shortness of breath (with anxiety).   Cardiovascular: Positive for chest pain (with anxiety) and palpitations (with anxiety).  Gastrointestinal: Positive for nausea.  Neurological: Positive for light-headedness (with anxiety).  Psychiatric/Behavioral: Positive for sleep disturbance and dysphoric mood. Negative for suicidal ideas and self-injury. The patient is nervous/anxious.        Objective:   Filed Vitals:   06/29/15 1123  BP: 100/66  Pulse: 65  Temp: 98.1 F (36.7 C)  Resp: 16   Filed Weights   06/29/15 1123  Weight: 133 lb (60.328 kg)   Body mass index is 19.93 kg/(m^2).   Physical Exam Constitutional: Appears well-developed and well-nourished. No distress.  Neck: Neck supple. No tracheal deviation present. No thyromegaly present.  No carotid bruit. No cervical adenopathy.   Cardiovascular: Normal rate, regular rhythm and normal heart sounds.   No murmur heard.  No edema Pulmonary/Chest: Effort normal and breath  sounds normal. No respiratory distress. No wheezes.  Psych:  depressed affect   CBC, TSH in the past few months has been normal.      Assessment & Plan:   See Problem List for Assessment and Plan of chronic medical problems.  Follow-up in 4 weeks, sooner if needed

## 2015-06-29 NOTE — Assessment & Plan Note (Addendum)
Depression is new. She does have a history of depression. She is experiencing increased depression related to family stresses, but also has some generalized anxiety with occasional panic attacks Has been taking xanax as needed, but has not taken this for a while because it makes her feel some help. Not suicidal Discussed possible medication options-we'll try low-dose Prozac 10 mg daily. Deferred a referral to therapy or psychiatry She will call if she has any side effects or concerns Follow-up in 4 weeks

## 2015-06-29 NOTE — Assessment & Plan Note (Signed)
Continue current dose of trazodone for now. If we end up increasing her dose of fluoxetine we may need to decrease her dose of trazodone

## 2015-06-29 NOTE — Patient Instructions (Signed)
Medications reviewed and updated.  Changes include starting generic prozac 10 mg daily.   Your prescription(s) have been submitted to your pharmacy. Please take as directed and contact our office if you believe you are having problem(s) with the medication(s).  Please followup in 4 weeks   Major Depressive Disorder Major depressive disorder is a mental illness. It also may be called clinical depression or unipolar depression. Major depressive disorder usually causes feelings of sadness, hopelessness, or helplessness. Some people with this disorder do not feel particularly sad but lose interest in doing things they used to enjoy (anhedonia). Major depressive disorder also can cause physical symptoms. It can interfere with work, school, relationships, and other normal everyday activities. The disorder varies in severity but is longer lasting and more serious than the sadness we all feel from time to time in our lives. Major depressive disorder often is triggered by stressful life events or major life changes. Examples of these triggers include divorce, loss of your job or home, a move, and the death of a family member or close friend. Sometimes this disorder occurs for no obvious reason at all. People who have family members with major depressive disorder or bipolar disorder are at higher risk for developing this disorder, with or without life stressors. Major depressive disorder can occur at any age. It may occur just once in your life (single episode major depressive disorder). It may occur multiple times (recurrent major depressive disorder). SYMPTOMS People with major depressive disorder have either anhedonia or depressed mood on nearly a daily basis for at least 2 weeks or longer. Symptoms of depressed mood include:  Feelings of sadness (blue or down in the dumps) or emptiness.  Feelings of hopelessness or helplessness.  Tearfulness or episodes of crying (may be observed by  others).  Irritability (children and adolescents). In addition to depressed mood or anhedonia or both, people with this disorder have at least four of the following symptoms:  Difficulty sleeping or sleeping too much.   Significant change (increase or decrease) in appetite or weight.   Lack of energy or motivation.  Feelings of guilt and worthlessness.   Difficulty concentrating, remembering, or making decisions.  Unusually slow movement (psychomotor retardation) or restlessness (as observed by others).   Recurrent wishes for death, recurrent thoughts of self-harm (suicide), or a suicide attempt. People with major depressive disorder commonly have persistent negative thoughts about themselves, other people, and the world. People with severe major depressive disorder may experiencedistorted beliefs or perceptions about the world (psychotic delusions). They also may see or hear things that are not real (psychotic hallucinations). DIAGNOSIS Major depressive disorder is diagnosed through an assessment by your health care provider. Your health care provider will ask aboutaspects of your daily life, such as mood,sleep, and appetite, to see if you have the diagnostic symptoms of major depressive disorder. Your health care provider may ask about your medical history and use of alcohol or drugs, including prescription medicines. Your health care provider also may do a physical exam and blood work. This is because certain medical conditions and the use of certain substances can cause major depressive disorder-like symptoms (secondary depression). Your health care provider also may refer you to a mental health specialist for further evaluation and treatment. TREATMENT It is important to recognize the symptoms of major depressive disorder and seek treatment. The following treatments can be prescribed for this disorder:   Medicine. Antidepressant medicines usually are prescribed. Antidepressant  medicines are thought to correct chemical imbalances  in the brain that are commonly associated with major depressive disorder. Other types of medicine may be added if the symptoms do not respond to antidepressant medicines alone or if psychotic delusions or hallucinations occur.  Talk therapy. Talk therapy can be helpful in treating major depressive disorder by providing support, education, and guidance. Certain types of talk therapy also can help with negative thinking (cognitive behavioral therapy) and with relationship issues that trigger this disorder (interpersonal therapy). A mental health specialist can help determine which treatment is best for you. Most people with major depressive disorder do well with a combination of medicine and talk therapy. Treatments involving electrical stimulation of the brain can be used in situations with extremely severe symptoms or when medicine and talk therapy do not work over time. These treatments include electroconvulsive therapy, transcranial magnetic stimulation, and vagal nerve stimulation.   This information is not intended to replace advice given to you by your health care provider. Make sure you discuss any questions you have with your health care provider.   Document Released: 06/14/2012 Document Revised: 03/10/2014 Document Reviewed: 06/14/2012 Elsevier Interactive Patient Education [employer].

## 2015-07-05 ENCOUNTER — Other Ambulatory Visit: Payer: Self-pay

## 2015-07-06 ENCOUNTER — Other Ambulatory Visit: Payer: Self-pay | Admitting: Gynecology

## 2015-07-06 ENCOUNTER — Telehealth: Payer: Self-pay

## 2015-07-06 MED ORDER — TRAZODONE HCL 150 MG PO TABS: ORAL_TABLET | ORAL | Status: DC

## 2015-07-06 NOTE — Telephone Encounter (Signed)
Call in 30 tablets to help her until her appointment.

## 2015-07-06 NOTE — Telephone Encounter (Signed)
Patient informed. Rx called in.

## 2015-07-06 NOTE — Telephone Encounter (Signed)
Patient called because her pharmacy had requested a refill on her Trazodone and it was denied. I told patient that you had denied it with note "patient should contact provider first".  Patient said when she was in back 11/2014 you had wanted her to see sleep MD Dr. Annamaria Boots.  She said she waited a little while before she called and when she did he was so booked up she does not have appointment until 07/12/15.  She only has one Trazadone left.

## 2015-07-12 ENCOUNTER — Other Ambulatory Visit (INDEPENDENT_AMBULATORY_CARE_PROVIDER_SITE_OTHER): Payer: Commercial Managed Care - HMO

## 2015-07-12 ENCOUNTER — Ambulatory Visit (INDEPENDENT_AMBULATORY_CARE_PROVIDER_SITE_OTHER): Payer: Commercial Managed Care - HMO | Admitting: Internal Medicine

## 2015-07-12 ENCOUNTER — Encounter: Payer: Self-pay | Admitting: Internal Medicine

## 2015-07-12 VITALS — BP 94/60 | HR 64 | Ht 68.5 in | Wt 132.6 lb

## 2015-07-12 DIAGNOSIS — G4733 Obstructive sleep apnea (adult) (pediatric): Secondary | ICD-10-CM | POA: Diagnosis not present

## 2015-07-12 DIAGNOSIS — G4700 Insomnia, unspecified: Secondary | ICD-10-CM | POA: Diagnosis not present

## 2015-07-12 DIAGNOSIS — J309 Allergic rhinitis, unspecified: Secondary | ICD-10-CM | POA: Diagnosis not present

## 2015-07-12 DIAGNOSIS — J302 Other seasonal allergic rhinitis: Secondary | ICD-10-CM | POA: Insufficient documentation

## 2015-07-12 DIAGNOSIS — J3089 Other allergic rhinitis: Secondary | ICD-10-CM

## 2015-07-12 LAB — CBC WITH DIFFERENTIAL/PLATELET
BASOS ABS: 0 10*3/uL (ref 0.0–0.1)
Basophils Relative: 0.4 % (ref 0.0–3.0)
EOS ABS: 0 10*3/uL (ref 0.0–0.7)
Eosinophils Relative: 0.5 % (ref 0.0–5.0)
HCT: 40.2 % (ref 36.0–46.0)
Hemoglobin: 13.5 g/dL (ref 12.0–15.0)
LYMPHS ABS: 1.6 10*3/uL (ref 0.7–4.0)
Lymphocytes Relative: 21.2 % (ref 12.0–46.0)
MCHC: 33.6 g/dL (ref 30.0–36.0)
MCV: 88.9 fl (ref 78.0–100.0)
MONO ABS: 0.5 10*3/uL (ref 0.1–1.0)
MONOS PCT: 6.2 % (ref 3.0–12.0)
NEUTROS ABS: 5.3 10*3/uL (ref 1.4–7.7)
NEUTROS PCT: 71.7 % (ref 43.0–77.0)
PLATELETS: 228 10*3/uL (ref 150.0–400.0)
RBC: 4.52 Mil/uL (ref 3.87–5.11)
RDW: 14.1 % (ref 11.5–15.5)
WBC: 7.3 10*3/uL (ref 4.0–10.5)

## 2015-07-12 MED ORDER — AZELASTINE-FLUTICASONE 137-50 MCG/ACT NA SUSP: 2.0000 | Freq: Every day | NASAL | Status: DC

## 2015-07-12 NOTE — Assessment & Plan Note (Signed)
Difficulty initiating and maintaining sleep. Difficulty initiating sleep is not typical of simple obstructive sleep apnea. She likely has a separate diagnosis of insomnia. We discussed basic good sleep hygiene and her experience with trazodone. Plan-update sleep study to assess OSA component then consider additional measures to help insomnia if appropriate.

## 2015-07-12 NOTE — Assessment & Plan Note (Signed)
Remote diagnosis of obstructive sleep apnea but did not tolerate CPAP well at that time. We will update and reassess. Plan-schedule sleep study

## 2015-07-12 NOTE — Progress Notes (Signed)
07/12/2015-70 year old female former smoker referred courtesy of Dr. Toney Rakes for sleep medicine evaluation Complains of difficulty initiating and maintaining sleep, loud snoring, witnessed apnea.  Evaluated many years ago for sleep disturbance with sleep study-report no longer available. She was given CPAP machine then but found it aggravating. Complicating medical problems include GERD Husband tells her she snores. Sleep is also fragmented by frequent nocturia. Admits daytime "tiredness" but does not nap. One cup of coffee in the morning. Trazodone at bedtime. Difficulty initiating sleep-30-40 minutes. Recently added Prozac. She feels she has "no energy" and attributes that to depression. ENT surgery-sinuses. She still has tonsils. Seasonal allergic rhinitis with nasal congestion and drainage inadequately treated with Flonase denies lung disease. Sleeps on a Tempur-Pedic bed. She and husband can operate their respective sides separately and she sleeps with head elevated.  Prior to Admission medications   Medication Sig Start Date End Date Taking? Authorizing Provider  b complex vitamins capsule Take 1 capsule by mouth daily.   Yes Historical Provider, MD  Biotin 1000 MCG tablet Take 1,000 mcg by mouth daily.     Yes Historical Provider, MD  FLUoxetine (PROZAC) 10 MG capsule Take 1 capsule (10 mg total) by mouth daily. 06/29/15  Yes [name], MD  fluticasone (FLONASE) 50 MCG/ACT nasal spray Place 1 spray into both nostrils 2 (two) times daily. 06/09/15  Yes Historical Provider, MD  Magnesium Oxide 250 MG TABS Take 250 mg by mouth daily.    Yes Historical Provider, MD  Probiotic Product (ALIGN) 4 MG CAPS Take 1 capsule by mouth daily.    Yes Historical Provider, MD  sodium chloride (OCEAN) 0.65 % SOLN nasal spray Place 1 spray into both nostrils as needed for congestion.   Yes Historical Provider, MD  traZODone (DESYREL) 150 MG tablet Take 1-2 tablets by mouth at bedtime 07/06/15  Yes Terrance Mass, MD  vitamin C (ASCORBIC ACID) 500 MG tablet Take 500 mg by mouth daily.   Yes Historical Provider, MD  Vitamin D, Ergocalciferol, (DRISDOL) 50000 units CAPS capsule Take 1 capsule (50,000 Units total) by mouth every 7 (seven) days. 06/22/15  Yes Anastasio Auerbach, MD  Azelastine-Fluticasone (DYMISTA) 137-50 MCG/ACT SUSP Place 2 sprays into both nostrils at bedtime. 07/12/15   Deneise Lever, MD   Past Medical History  Diagnosis Date  . Diverticulosis   . Gastroesophageal reflux disease with hiatal hernia   . IBS (irritable bowel syndrome)   . Hyperlipidemia   . Spondylosis   . Migraine headache   . Schatzki's ring     History of  . Osteopenia     BMD ordered by GYN  . Mitral valve prolapse   . Depression     generalized anxiety disorder  . Hiatal hernia   . Fibromyalgia   . Peripheral neuropathy ([place])     treated as RLS by  Neurology  . Lymphocytic colitis     Dr Sharlett Iles  . Duodenal diverticulum   . Anxiety   . Restless leg syndrome   . Complication of anesthesia   . PONV (postoperative nausea and vomiting)   . Family history of adverse reaction to anesthesia     Mother and Daughters- N/V  . Sleep apnea     On CPAP, has not been using  . History of kidney stones   . Neuropathy [facility])    Past Surgical History  Procedure Laterality Date  . Tubal ligation    . Cholecystectomy    . Vaginal cystectomy  x 2  . Rotator cuff repair Left     left  . Sinus surgery with instatrak      x 2  . Esophageal dilation      X 2  . Breast enhancement surgery    . Cataract surgery Left     Dr Bing Plume  . Colonoscopy    . Eye surgery      cataract  . Vaginal hysterectomy  05/2007    Vaginal repair, Dr Ubaldo Glassing.  Partial  hysterectomy.  . Sinus endo w/fusion N/A 05/30/2014    Procedure: REVISION  FRONTAL SINUS SURGERY WITH FUSION SCAN;  Surgeon: Jerrell Belfast, MD;  Location: [facility] OR;  Service: ENT;  Laterality: N/A;   Family History  Problem Relation Age of Onset  .  Hypertension Father 19  . Stroke Father 45  . Heart attack Father      ? in 25s  . Hypertension Mother   . Neuropathy Mother   . Anemia Mother   . Coronary artery disease Brother     Stent placement in 60s  . Colon cancer Neg Hx   . Seizures Neg Hx   . Diabetes Maternal Uncle   . Heart attack Paternal Uncle     SEVEN , ? age   Social History   Social History  . Marital Status: Married    Spouse Name: N/A  . Number of Children: 2  . Years of Education: 18   Occupational History  . retired [employer]   Social History Main Topics  . Smoking status: Former Smoker -- 0.50 packs/day for 15 years    Types: Cigarettes    Quit date: 03/03/2004  . Smokeless tobacco: Never Used     Comment: smoked 1973- 2006, up to <  1  ppd  . Alcohol Use: No  . Drug Use: No  . Sexual Activity: Yes   Other Topics Concern  . Not on file   Social History Narrative   HAS REGULAR EXERCISE   DAILY CAFFEINE: 1-2 CUPS   Patient is right handed.   ROS-see HPI   Negative unless "+" Constitutional:    weight loss, night sweats, fevers, chills, fatigue, lassitude. HEENT:    headaches, difficulty swallowing, tooth/dental problems, sore throat,       sneezing, itching, ear ache, nasal congestion, post nasal drip, snoring CV:    chest pain, orthopnea, PND, swelling in lower extremities, anasarca,                                                        dizziness, palpitations Resp:   shortness of breath with exertion or at rest.                productive cough,   non-productive cough, coughing up of blood.              change in color of mucus.  wheezing.   Skin:    rash or lesions. GI:  No-   heartburn, indigestion, abdominal pain, nausea, vomiting, diarrhea,                 change in bowel habits, loss of appetite GU: dysuria, change in color of urine, no urgency or frequency.   flank pain. MS:   joint pain, stiffness, decreased range of motion, back pain.  Neuro-     nothing unusual Psych:  change in  mood or affect.  depression or anxiety.   memory loss.  OBJ- Physical Exam General- Alert, Oriented, Affect-appropriate, Distress- none acute, slender Skin- rash-none, lesions- none, excoriation- none Lymphadenopathy- none Head- atraumatic            Eyes- Gross vision intact, PERRLA, conjunctivae and secretions clear            Ears- Hearing, canals-normal            Nose- , + turbinate edema, no-Septal dev, mucus, polyps, erosion, perforation             Throat- Mallampati II-III , mucosa clear , drainage- none, tonsils- atrophic, + own teeth Neck- flexible , trachea midline, no stridor , thyroid nl, carotid no bruit Chest - symmetrical excursion , unlabored           Heart/CV- RRR , no murmur , no gallop  , no rub, nl s1 s2                           - JVD- none , edema- none, stasis changes- none, varices- none           Lung- clear to P&A, wheeze- none, cough- none , dullness-none, rub- none           Chest wall-  Abd-  Br/ Gen/ Rectal- Not done, not indicated Extrem- cyanosis- none, clubbing, none, atrophy- none, strength- nl Neuro- grossly intact to observation

## 2015-07-12 NOTE — Assessment & Plan Note (Signed)
Complains of stuffiness and drainage Plan-sample Dymista for trial instead of Flonase

## 2015-07-12 NOTE — Patient Instructions (Addendum)
Order- lab- Allergy Profile, CBC w diff  Order- schedule sleep study - unattended home sleep test if available  Sample Dymista nasal spray     1-2 puffs each nostril at bedtime    See if you like this better than what you had been using  Please call as needed   We will get you back after your sleep study to go over results

## 2015-07-13 LAB — RESPIRATORY ALLERGY PROFILE REGION II ~~LOC~~
Allergen, Cottonwood, t14: <0.1 kU/L
Allergen, D pternoyssinus,d7: <0.1 kU/L
Allergen, Mouse Urine Protein, e78: <0.1 kU/L
Allergen, Mulberry, t76: <0.1 kU/L
Alternaria Alternata: <0.1 kU/L
Aspergillus fumigatus, m3: <0.1 kU/L
Bermuda Grass: <0.1 kU/L
Cladosporium Herbarum: <0.1 kU/L
Cockroach: <0.1 kU/L
Common Ragweed: <0.1 kU/L
D. farinae: <0.1 kU/L
IGE (IMMUNOGLOBULIN E), SERUM: 6 kU/L (ref <115)
Johnson Grass: <0.1 kU/L
Pecan/Hickory Tree IgE: <0.1 kU/L
Penicillium Notatum: <0.1 kU/L
Timothy Grass: <0.1 kU/L

## 2015-07-16 ENCOUNTER — Telehealth: Payer: Self-pay | Admitting: Cardiovascular Disease

## 2015-07-16 NOTE — Telephone Encounter (Signed)
Patient recently started a new rx for depression prozac by Dr. Quay Burow.  Patient wants to make sure this does not have side effect on heart.    Pt. C/O increased heart rate (feels like it has not taken)   Please call to discuss.

## 2015-07-16 NOTE — Telephone Encounter (Signed)
Pt wants to make sure that Dr. Rockey Situ is agreeable to her taking Prozac.   She will not start until she hears back regarding his opinion.

## 2015-07-17 NOTE — Telephone Encounter (Signed)
Should be fine, no effect on the heart

## 2015-07-18 NOTE — Telephone Encounter (Signed)
Spoke w/ pt.  Advised her of Dr. Gollan's recommendation. She verbalizes understanding and is appreciative of the call.  

## 2015-07-19 ENCOUNTER — Telehealth: Payer: Self-pay | Admitting: *Deleted

## 2015-07-19 NOTE — Telephone Encounter (Signed)
Pt called and left message in triage requesting referral to sleep study clinic, I advised pt that she will need to contact [place] office to have this done, her notes on OV 07/12/15 states his office will schedule for pt.

## 2015-07-25 ENCOUNTER — Ambulatory Visit: Payer: Commercial Managed Care - HMO | Admitting: Internal Medicine

## 2015-07-31 ENCOUNTER — Ambulatory Visit: Payer: Commercial Managed Care - HMO | Admitting: Internal Medicine

## 2015-08-01 ENCOUNTER — Ambulatory Visit (INDEPENDENT_AMBULATORY_CARE_PROVIDER_SITE_OTHER): Payer: Commercial Managed Care - HMO | Admitting: Internal Medicine

## 2015-08-01 ENCOUNTER — Telehealth: Payer: Self-pay | Admitting: Internal Medicine

## 2015-08-01 ENCOUNTER — Encounter: Payer: Self-pay | Admitting: Internal Medicine

## 2015-08-01 VITALS — BP 94/68 | HR 68 | Temp 98.0°F | Resp 16 | Wt 135.0 lb

## 2015-08-01 DIAGNOSIS — R5383 Other fatigue: Secondary | ICD-10-CM

## 2015-08-01 DIAGNOSIS — R0789 Other chest pain: Secondary | ICD-10-CM

## 2015-08-01 DIAGNOSIS — R002 Palpitations: Secondary | ICD-10-CM | POA: Diagnosis not present

## 2015-08-01 DIAGNOSIS — F418 Other specified anxiety disorders: Secondary | ICD-10-CM

## 2015-08-01 MED ORDER — DULOXETINE HCL 20 MG PO CPEP: 20.0000 mg | ORAL_CAPSULE | Freq: Every day | ORAL | Status: DC

## 2015-08-01 NOTE — Telephone Encounter (Signed)
lmtcb x1 for pt. 

## 2015-08-01 NOTE — Progress Notes (Signed)
Subjective:    Patient ID: [name], female    DOB: [birth year], 70 y.o.   MRN: [id number]  HPI She is here for follow up.  Recently she was getting ready to go to the beach and she was overdoing it.  She had a vibrating sensation in her chest, her hands were shaky and her breathing was heavy, which she has continued to have and is worse sometimes than others.  Last Friday she went to the ED in [place] [facility].   They did blood work which was all normal.  They diagnosed her with chronic fatigue syndrome.   She has tightness in her chest, pain across her mid back and palpitations.  The palpitations are occuring more often, but are not new.  She has sob at rest and with exertion.  She is concerned about her heart.     Insomnia:  She is taking the trazodone nightly.  She still does not sleeping well.  She saw pulmonary and they have ordered a sleep study.    Depression, anxiety:  Started on prozac 10 mg 4 weeks ago daily and stopped it due to bowel problems, tingling sensation in extremities.  She still has some tingling in her arms, but knows the medication may still be in her system.  She did not feel the medication was helping and since she was having side effects she stopped it just less than one week ago.  She still feels depressed and anxious.   Medications and allergies reviewed with patient and updated if appropriate.  Patient Active Problem List   Diagnosis Date Noted  . Obstructive sleep apnea 07/12/2015  . Seasonal and perennial allergic rhinitis 07/12/2015  . Chest pain 04/26/2015  . Anemia 12/05/2014  . Vitamin D deficiency 12/05/2014  . Sinusitis, chronic 05/30/2014    Class: Chronic  . Tick bite of right popliteal region 11/04/2013  . Hypokalemia 03/28/2013  . Vaginal atrophy 10/20/2012  . Insomnia 10/20/2012  . Orthostatic hypotension 02/16/2012  . Chronic LLQ pain 05/20/2011  . Cough 02/13/2011  . Palpitations 12/31/2010  . GERD (gastroesophageal reflux  disease) 07/25/2010  . Depression with anxiety 07/25/2010  . Lymphocytic colitis 06/14/2010  . Gastroesophageal reflux disease with hiatal hernia   . ANEMIA, MILD 01/15/2010  . BACK PAIN, CHRONIC 12/27/2009  . OSTEOPENIA 12/27/2009  . ABDOMINAL BRUIT 09/14/2009  . 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 Fatigue 06/25/2009  . DYSPNEA/SHORTNESS OF BREATH 06/25/2009  . CHEST PAIN-PRECORDIAL 12/12/2008  . Hereditary and idiopathic peripheral neuropathy 12/22/2007  . OTHER ABNORMAL GLUCOSE 12/22/2007  . Hyperlipidemia 03/09/2007  . Generalized anxiety disorder 03/09/2007  . MIGRAINE HEADACHE 03/09/2007  . IBS 03/09/2007  . SPONDYLOSIS 03/09/2007  . Myalgia and myositis 03/09/2007  . SCHATZKI'S RING, HX OF 03/09/2007  . HIATAL HERNIA WITH REFLUX 12/30/2006  . GASTRITIS 02/17/2005  . DIVERTICULOSIS OF COLON 01/01/2000    Current Outpatient Prescriptions on File Prior to Visit  Medication Sig Dispense Refill  . Azelastine-Fluticasone (DYMISTA) 137-50 MCG/ACT SUSP Place 2 sprays into both nostrils at bedtime. 1 Bottle 0  . b complex vitamins capsule Take 1 capsule by mouth daily.    . Biotin 1000 MCG tablet Take 1,000 mcg by mouth daily.      [name] FLUoxetine (PROZAC) 10 MG capsule Take 1 capsule (10 mg total) by mouth daily. 30 capsule 5  . fluticasone (FLONASE) 50 MCG/ACT nasal spray Place 1 spray into both nostrils 2 (two) times daily.    [name]  Magnesium Oxide 250 MG TABS Take 250 mg by mouth daily.     . Probiotic Product (ALIGN) 4 MG CAPS Take 1 capsule by mouth daily.     . sodium chloride (OCEAN) 0.65 % SOLN nasal spray Place 1 spray into both nostrils as needed for congestion.    . traZODone (DESYREL) 150 MG tablet Take 1-2 tablets by mouth at bedtime 30 tablet 0  . vitamin C (ASCORBIC ACID) 500 MG tablet Take 500 mg by mouth daily.    . Vitamin D, Ergocalciferol, (DRISDOL) 50000 units CAPS capsule Take 1 capsule (50,000 Units total) by mouth every 7 (seven) days.  12 capsule 0  . [DISCONTINUED] colesevelam (WELCHOL) 625 MG tablet Take 625 mg by mouth 2 (two) times daily with a meal. Take one tablet by mouth twice a day    . [DISCONTINUED] Hyoscyamine-Phenyltoloxamine ([place] NF) [id number] MG CAPS Take one tablet by mouth as needed for abdominal cramping 24 each 0   No current facility-administered medications on file prior to visit.    Past Medical History  Diagnosis Date  . Diverticulosis   . Gastroesophageal reflux disease with hiatal hernia   . IBS (irritable bowel syndrome)   . Hyperlipidemia   . Spondylosis   . Migraine headache   . Schatzki's ring     History of  . Osteopenia     BMD ordered by GYN  . Mitral valve prolapse   . Depression     generalized anxiety disorder  . Hiatal hernia   . Fibromyalgia   . Peripheral neuropathy ([place])     treated as RLS by  Neurology  . Lymphocytic colitis     Dr Sharlett Iles  . Duodenal diverticulum   . Anxiety   . Restless leg syndrome   . Complication of anesthesia   . PONV (postoperative nausea and vomiting)   . Family history of adverse reaction to anesthesia     Mother and Daughters- N/V  . Sleep apnea     On CPAP, has not been using  . History of kidney stones   . Neuropathy [facility])     Past Surgical History  Procedure Laterality Date  . Tubal ligation    . Cholecystectomy    . Vaginal cystectomy      x 2  . Rotator cuff repair Left     left  . Sinus surgery with instatrak      x 2  . Esophageal dilation      X 2  . Breast enhancement surgery    . Cataract surgery Left     Dr Bing Plume  . Colonoscopy    . Eye surgery      cataract  . Vaginal hysterectomy  05/2007    Vaginal repair, Dr Ubaldo Glassing.  Partial  hysterectomy.  . Sinus endo w/fusion N/A 05/30/2014    Procedure: REVISION  FRONTAL SINUS SURGERY WITH FUSION SCAN;  Surgeon: Jerrell Belfast, MD;  Location: [place];  Service: ENT;  Laterality: N/A;    Social History   Social History  . Marital Status: Married    Spouse Name:  N/A  . Number of Children: 2  . Years of Education: 18   Occupational History  . retired [employer]   Social History Main Topics  . Smoking status: Former Smoker -- 0.50 packs/day for 15 years    Types: Cigarettes    Quit date: 03/03/2004  . Smokeless tobacco: Never Used     Comment: smoked 1973- 2006, up to <  1  ppd  . Alcohol Use: No  . Drug Use: No  . Sexual Activity: Yes   Other Topics Concern  . None   Social History Narrative   HAS REGULAR EXERCISE   DAILY CAFFEINE: 1-2 CUPS   Patient is right handed.    Family History  Problem Relation Age of Onset  . Hypertension Father 67  . Stroke Father 5  . Heart attack Father      ? in 42s  . Hypertension Mother   . Neuropathy Mother   . Anemia Mother   . Coronary artery disease Brother     Stent placement in 60s  . Colon cancer Neg Hx   . Seizures Neg Hx   . Diabetes Maternal Uncle   . Heart attack Paternal Uncle     SEVEN , ? age    Review of Systems  Constitutional: Positive for fatigue. Negative for fever.  Respiratory: Positive for cough and shortness of breath (worse when walk, sometimes when sitting). Negative for wheezing.   Cardiovascular: Positive for leg swelling (mild). Negative for chest pain and palpitations.  Musculoskeletal: Positive for myalgias (lower leg pain) and neck pain.  Neurological: Positive for dizziness and light-headedness (postural). Negative for headaches.       Tension band across top of head  Psychiatric/Behavioral: Positive for dysphoric mood and decreased concentration. The patient is nervous/anxious.        Objective:   Filed Vitals:   08/01/15 1350 08/01/15 1401  BP: 104/62 94/68  Pulse: 68   Temp: 98 F (36.7 C)   Resp: 16    Filed Weights   08/01/15 1350  Weight: 135 lb (61.236 kg)   Body mass index is 20.23 kg/(m^2).   Physical Exam Constitutional: Appears well-developed and well-nourished. No distress.  Neck: Neck supple. No tracheal deviation present. No  thyromegaly present.  No carotid bruit. No cervical adenopathy.   Cardiovascular: Normal rate, regular rhythm and normal heart sounds.   No murmur heard.  No edema Pulmonary/Chest: Effort normal and breath sounds normal. No respiratory distress. No wheezes.  Psych:  Depressed affect      Assessment & Plan:   See Problem List for Assessment and Plan of chronic medical problems.

## 2015-08-01 NOTE — Progress Notes (Signed)
Pre visit review using our clinic review tool, if applicable. No additional management support is needed unless otherwise documented below in the visit note. 

## 2015-08-01 NOTE — Patient Instructions (Signed)
Start the cymbalta 20 mg daily - this was sent to your pharmacy.  Call and make an appointment with Dr Rockey Situ.

## 2015-08-02 ENCOUNTER — Telehealth: Payer: Self-pay | Admitting: Cardiovascular Disease

## 2015-08-02 ENCOUNTER — Telehealth: Payer: Self-pay | Admitting: Internal Medicine

## 2015-08-02 NOTE — Telephone Encounter (Signed)
Pt has not been scheduled for HST. This was ordered on 5/17. Per Henrietta Dine should have this order.  Dawne - can you follow up please? Thanks!

## 2015-08-02 NOTE — Telephone Encounter (Signed)
Pt calling stating she is having  Some issues with her heart and her palpitations. Her PCP just told her to call us and see if we can help with that PCP is Dr Quay Burow (in epic) and Pulmonary (in epic) Please advise.

## 2015-08-02 NOTE — Telephone Encounter (Signed)
Pt's PCP asked her to call and make an appt for eval w/ Dr. Rockey Situ. Can we set her up? I'll also put her on the wait list in case of a cancellation.

## 2015-08-03 NOTE — Telephone Encounter (Signed)
Duplicate message.  This has been addressed in other phone note.  Will close message.

## 2015-08-03 NOTE — Telephone Encounter (Signed)
Pt is coming 09/24/15 to see Dr Rockey Situ and is on wait list

## 2015-08-03 NOTE — Telephone Encounter (Signed)
I had spoken with the patient and we had scheduled an appt for her to pick up HST machine on 08/09/15.  Patient had requested when we made this appt that I leave a detailed VM on her cell phone.  When I called pt this morning, she said that the VM did not come through so she was calling because she did not remember the date to pick up the HST.  I gave patient the date and time of the appt.  Nothing further needed.

## 2015-08-09 DIAGNOSIS — G4733 Obstructive sleep apnea (adult) (pediatric): Secondary | ICD-10-CM | POA: Diagnosis not present

## 2015-08-10 ENCOUNTER — Telehealth: Payer: Self-pay

## 2015-08-10 MED ORDER — TRAZODONE HCL 150 MG PO TABS: ORAL_TABLET | ORAL | Status: DC

## 2015-08-10 NOTE — Telephone Encounter (Signed)
Rx sent. Patient informed. 

## 2015-08-10 NOTE — Telephone Encounter (Signed)
Patient said she had sleep study done but does not go back to discuss results until 08/31/15.  She is asking for refill on her Trazadone because she is having a really hard time sleeping without it.

## 2015-08-10 NOTE — Telephone Encounter (Signed)
Reassure for 30 tablets

## 2015-08-14 DIAGNOSIS — G4733 Obstructive sleep apnea (adult) (pediatric): Secondary | ICD-10-CM | POA: Diagnosis not present

## 2015-08-15 ENCOUNTER — Other Ambulatory Visit (INDEPENDENT_AMBULATORY_CARE_PROVIDER_SITE_OTHER): Payer: Commercial Managed Care - HMO

## 2015-08-15 ENCOUNTER — Other Ambulatory Visit: Payer: Self-pay | Admitting: *Deleted

## 2015-08-15 ENCOUNTER — Encounter: Payer: Self-pay | Admitting: Internal Medicine

## 2015-08-15 ENCOUNTER — Ambulatory Visit (INDEPENDENT_AMBULATORY_CARE_PROVIDER_SITE_OTHER): Payer: Commercial Managed Care - HMO | Admitting: Internal Medicine

## 2015-08-15 VITALS — BP 124/82 | HR 62 | Temp 97.6°F | Resp 16 | Wt 131.0 lb

## 2015-08-15 DIAGNOSIS — R1084 Generalized abdominal pain: Secondary | ICD-10-CM

## 2015-08-15 DIAGNOSIS — F418 Other specified anxiety disorders: Secondary | ICD-10-CM | POA: Diagnosis not present

## 2015-08-15 DIAGNOSIS — G4733 Obstructive sleep apnea (adult) (pediatric): Secondary | ICD-10-CM

## 2015-08-15 DIAGNOSIS — R197 Diarrhea, unspecified: Secondary | ICD-10-CM

## 2015-08-15 DIAGNOSIS — R110 Nausea: Secondary | ICD-10-CM | POA: Diagnosis not present

## 2015-08-15 LAB — COMPREHENSIVE METABOLIC PANEL
ALK PHOS: 64 U/L (ref 39–117)
ALT: 12 U/L (ref 0–35)
AST: 17 U/L (ref 0–37)
Albumin: 4.6 g/dL (ref 3.5–5.2)
BUN: 8 mg/dL (ref 6–23)
CO2: 30 meq/L (ref 19–32)
Calcium: 9.7 mg/dL (ref 8.4–10.5)
Chloride: 101 mEq/L (ref 96–112)
Creatinine, Ser: 0.61 mg/dL (ref 0.40–1.20)
GFR: 102.62 mL/min (ref >60.00)
GLUCOSE: 93 mg/dL (ref 70–99)
POTASSIUM: 3.4 meq/L — AB (ref 3.5–5.1)
SODIUM: 138 meq/L (ref 135–145)
TOTAL PROTEIN: 7.3 g/dL (ref 6.0–8.3)
Total Bilirubin: 0.4 mg/dL (ref 0.2–1.2)

## 2015-08-15 LAB — CBC WITH DIFFERENTIAL/PLATELET
Basophils Absolute: 0 10*3/uL (ref 0.0–0.1)
Basophils Relative: 0.3 % (ref 0.0–3.0)
EOS PCT: 1.5 % (ref 0.0–5.0)
Eosinophils Absolute: 0.1 10*3/uL (ref 0.0–0.7)
HCT: 39.8 % (ref 36.0–46.0)
Hemoglobin: 13.2 g/dL (ref 12.0–15.0)
LYMPHS ABS: 1.8 10*3/uL (ref 0.7–4.0)
Lymphocytes Relative: 18.1 % (ref 12.0–46.0)
MCHC: 33.1 g/dL (ref 30.0–36.0)
MCV: 89.6 fl (ref 78.0–100.0)
MONO ABS: 0.7 10*3/uL (ref 0.1–1.0)
Monocytes Relative: 6.9 % (ref 3.0–12.0)
NEUTROS ABS: 7.1 10*3/uL (ref 1.4–7.7)
NEUTROS PCT: 73.2 % (ref 43.0–77.0)
PLATELETS: 265 10*3/uL (ref 150.0–400.0)
RBC: 4.44 Mil/uL (ref 3.87–5.11)
RDW: 14.3 % (ref 11.5–15.5)
WBC: 9.7 10*3/uL (ref 4.0–10.5)

## 2015-08-15 MED ORDER — ONDANSETRON 4 MG PO TBDP: 4.0000 mg | ORAL_TABLET | Freq: Three times a day (TID) | ORAL | Status: DC | PRN

## 2015-08-15 MED ORDER — ALPRAZOLAM 0.25 MG PO TABS: 0.2500 mg | ORAL_TABLET | Freq: Every day | ORAL | Status: DC | PRN

## 2015-08-15 NOTE — Assessment & Plan Note (Signed)
Related to diarrhea and abdominal pain Zofran as needed

## 2015-08-15 NOTE — Progress Notes (Signed)
Subjective:    Patient ID: [name], female    DOB: 28-Nov-1945, 70 y.o.   MRN: [id number]  HPI She is here for follow up.   Depression, anxiety: She was on the cymbalta for 10 days and had several side effects.  She stopped it over one week ago.  She feels it made her anxiety / panic attacks worse and they have improved since she stopped the medication.  She still has them with chest pain, palps and sob.  She is unsure if she is depressed or not.  She has taken xanax in the past and that worked for her.  She would take 1/2 of a pill of the 0.25mg  dose and would not take it daily.  She has not tolerated a few SSRI/SSNRI and does not want to try another one.    Insomnia:  She is taking trazodone and it helps a little.    Diarrhea, nausea, abdominal pain:  She started having nausea and diarrhea before she was on the cymbalta. Before going on the cymbalta it was intermittent, but since the medication is has been persistent.  She has had 4 episodes of diarrhea this morning, and can have up to 7 episodes a day.  She has nausea and abdominal pain.  She has a history of lymphocytic colitis and has had several episodes in the past.  She denies any blood in her stool.  She denies recent antibiotics.  She is drinking water and Gatorade and is eating a bland diet.    Medications and allergies reviewed with patient and updated if appropriate.  Patient Active Problem List   Diagnosis Date Noted  . Obstructive sleep apnea 07/12/2015  . Seasonal and perennial allergic rhinitis 07/12/2015  . Chest pain 04/26/2015  . Anemia 12/05/2014  . Vitamin D deficiency 12/05/2014  . Sinusitis, chronic 05/30/2014    Class: Chronic  . Tick bite of right popliteal region 11/04/2013  . Hypokalemia 03/28/2013  . Vaginal atrophy 10/20/2012  . Insomnia 10/20/2012  . Orthostatic hypotension 02/16/2012  . Chronic LLQ pain 05/20/2011  . Cough 02/13/2011  . Palpitations 12/31/2010  . GERD (gastroesophageal  reflux disease) 07/25/2010  . Depression with anxiety 07/25/2010  . Lymphocytic colitis 06/14/2010  . Gastroesophageal reflux disease with hiatal hernia   . ANEMIA, MILD 01/15/2010  . BACK PAIN, CHRONIC 12/27/2009  . OSTEOPENIA 12/27/2009  . ABDOMINAL BRUIT 09/14/2009  . 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 Fatigue 06/25/2009  . DYSPNEA/SHORTNESS OF BREATH 06/25/2009  . CHEST PAIN-PRECORDIAL 12/12/2008  . Hereditary and idiopathic peripheral neuropathy 12/22/2007  . OTHER ABNORMAL GLUCOSE 12/22/2007  . Hyperlipidemia 03/09/2007  . Generalized anxiety disorder 03/09/2007  . MIGRAINE HEADACHE 03/09/2007  . IBS 03/09/2007  . SPONDYLOSIS 03/09/2007  . Myalgia and myositis 03/09/2007  . SCHATZKI'S RING, HX OF 03/09/2007  . HIATAL HERNIA WITH REFLUX 12/30/2006  . GASTRITIS 02/17/2005  . DIVERTICULOSIS OF COLON 01/01/2000    Current Outpatient Prescriptions on File Prior to Visit  Medication Sig Dispense Refill  . Azelastine-Fluticasone (DYMISTA) 137-50 MCG/ACT SUSP Place 2 sprays into both nostrils at bedtime. 1 Bottle 0  . b complex vitamins capsule Take 1 capsule by mouth daily.    . Biotin 1000 MCG tablet Take 1,000 mcg by mouth daily.      . fluticasone (FLONASE) 50 MCG/ACT nasal spray Place 1 spray into both nostrils 2 (two) times daily.    . Magnesium Oxide 250 MG TABS Take 250 mg by mouth daily.     [name]  Probiotic Product (ALIGN) 4 MG CAPS Take 1 capsule by mouth daily.     . sodium chloride (OCEAN) 0.65 % SOLN nasal spray Place 1 spray into both nostrils as needed for congestion.    . traZODone (DESYREL) 150 MG tablet Take 1-2 tablets by mouth at bedtime 30 tablet 0  . vitamin C (ASCORBIC ACID) 500 MG tablet Take 500 mg by mouth daily.    . Vitamin D, Ergocalciferol, (DRISDOL) 50000 units CAPS capsule Take 1 capsule (50,000 Units total) by mouth every 7 (seven) days. 12 capsule 0  . [DISCONTINUED] colesevelam (WELCHOL) 625 MG tablet Take 625 mg by mouth  2 (two) times daily with a meal. Take one tablet by mouth twice a day    . [DISCONTINUED] Hyoscyamine-Phenyltoloxamine ([place] NF) [id number] MG CAPS Take one tablet by mouth as needed for abdominal cramping 24 each 0   No current facility-administered medications on file prior to visit.    Past Medical History  Diagnosis Date  . Diverticulosis   . Gastroesophageal reflux disease with hiatal hernia   . IBS (irritable bowel syndrome)   . Hyperlipidemia   . Spondylosis   . Migraine headache   . Schatzki's ring     History of  . Osteopenia     BMD ordered by GYN  . Mitral valve prolapse   . Depression     generalized anxiety disorder  . Hiatal hernia   . Fibromyalgia   . Peripheral neuropathy ([place])     treated as RLS by  Neurology  . Lymphocytic colitis     Dr Sharlett Iles  . Duodenal diverticulum   . Anxiety   . Restless leg syndrome   . Complication of anesthesia   . PONV (postoperative nausea and vomiting)   . Family history of adverse reaction to anesthesia     Mother and Daughters- N/V  . Sleep apnea     On CPAP, has not been using  . History of kidney stones   . Neuropathy [facility])     Past Surgical History  Procedure Laterality Date  . Tubal ligation    . Cholecystectomy    . Vaginal cystectomy      x 2  . Rotator cuff repair Left     left  . Sinus surgery with instatrak      x 2  . Esophageal dilation      X 2  . Breast enhancement surgery    . Cataract surgery Left     Dr Bing Plume  . Colonoscopy    . Eye surgery      cataract  . Vaginal hysterectomy  05/2007    Vaginal repair, Dr Ubaldo Glassing.  Partial  hysterectomy.  . Sinus endo w/fusion N/A 05/30/2014    Procedure: REVISION  FRONTAL SINUS SURGERY WITH FUSION SCAN;  Surgeon: Jerrell Belfast, MD;  Location: [facility];  Service: ENT;  Laterality: N/A;    Social History   Social History  . Marital Status: Married    Spouse Name: N/A  . Number of Children: 2  . Years of Education: 18   Occupational History  .  retired [employer]   Social History Main Topics  . Smoking status: Former Smoker -- 0.50 packs/day for 15 years    Types: Cigarettes    Quit date: 03/03/2004  . Smokeless tobacco: Never Used     Comment: smoked 1973- 2006, up to <  1  ppd  . Alcohol Use: No  . Drug Use: No  .  Sexual Activity: Yes   Other Topics Concern  . Not on file   Social History Narrative   HAS REGULAR EXERCISE   DAILY CAFFEINE: 1-2 CUPS   Patient is right handed.    Family History  Problem Relation Age of Onset  . Hypertension Father 11  . Stroke Father 9  . Heart attack Father      ? in 41s  . Hypertension Mother   . Neuropathy Mother   . Anemia Mother   . Coronary artery disease Brother     Stent placement in 60s  . Colon cancer Neg Hx   . Seizures Neg Hx   . Diabetes Maternal Uncle   . Heart attack Paternal Uncle     SEVEN , ? age    Review of Systems  Constitutional: Negative for fever.  Respiratory: Positive for shortness of breath (with panic attacks).   Cardiovascular: Positive for chest pain (with panic attacks) and palpitations (with panic attacks).  Gastrointestinal: Positive for nausea, abdominal pain and diarrhea. Negative for vomiting and blood in stool.       Objective:   Filed Vitals:   08/15/15 1130  BP: 124/82  Pulse: 62  Temp: 97.6 F (36.4 C)  Resp: 16   Filed Weights   08/15/15 1130  Weight: 131 lb (59.421 kg)   Body mass index is 19.63 kg/(m^2).   Physical Exam  Constitutional: She appears well-developed and well-nourished. No distress.  HENT:  Mouth/Throat: Oropharynx is clear and moist.  Cardiovascular: Normal rate, regular rhythm and normal heart sounds.   No murmur heard. Pulmonary/Chest: Effort normal and breath sounds normal. No respiratory distress. She has no wheezes. She has no rales.  Abdominal: Soft. She exhibits no distension and no mass. There is tenderness (diffusely). There is no rebound and no guarding.  Increased BS  Musculoskeletal:  She exhibits no edema.  Skin: She is not diaphoretic.  Psychiatric:  Depressed affect        Assessment & Plan:   See Problem List for Assessment and Plan of chronic medical problems.

## 2015-08-15 NOTE — Assessment & Plan Note (Signed)
Related to diarrhea, possible colitis Will hold off on imaging given her history of lymphocytic colitis, but if his symptoms worsen and she is not able to see GI quickly we will need to do a CAT scan

## 2015-08-15 NOTE — Assessment & Plan Note (Signed)
nonbloody, watery diarrhea, several episodes a day H/o lymphocytic colitis, which is likely what she is experiencing Less likely bacterial in nature, but we will check stool studies, no recent antibiotics  Check CBC, CMP Urgent GI referral for possible colitis

## 2015-08-15 NOTE — Progress Notes (Signed)
Pre visit review using our clinic review tool, if applicable. No additional management support is needed unless otherwise documented below in the visit note. 

## 2015-08-15 NOTE — Patient Instructions (Addendum)
  Test(s) ordered today. Your results will be released to [facility] (or called to you) after review, usually within 72hours after test completion. If any changes need to be made, you will be notified at that same time.   Medications reviewed and updated.  An anti-nausea medication was sent to your pharmacy.   Your prescription(s) have been submitted to your pharmacy. Please take as directed and contact our office if you believe you are having problem(s) with the medication(s).  A referral was ordered for GI.

## 2015-08-15 NOTE — Assessment & Plan Note (Signed)
She has not tolerated Prozac and Cymbalta Has done okay with Xanax in the past and a very low dose and not taking it daily and we will retry this. Discussed potential addiction to medication and side effects. She will only take as needed She is unsure if she is still depressed and we will hold off on any antidepressant since she has not tolerated them well Deferred referral to a psychiatrist

## 2015-08-16 ENCOUNTER — Ambulatory Visit (INDEPENDENT_AMBULATORY_CARE_PROVIDER_SITE_OTHER): Payer: Commercial Managed Care - HMO | Admitting: Internal Medicine

## 2015-08-16 ENCOUNTER — Encounter: Payer: Self-pay | Admitting: Internal Medicine

## 2015-08-16 ENCOUNTER — Other Ambulatory Visit (INDEPENDENT_AMBULATORY_CARE_PROVIDER_SITE_OTHER): Payer: Commercial Managed Care - HMO

## 2015-08-16 VITALS — BP 100/60 | HR 76 | Ht 67.5 in | Wt 132.2 lb

## 2015-08-16 DIAGNOSIS — F418 Other specified anxiety disorders: Secondary | ICD-10-CM

## 2015-08-16 DIAGNOSIS — K52832 Lymphocytic colitis: Secondary | ICD-10-CM

## 2015-08-16 DIAGNOSIS — K589 Irritable bowel syndrome without diarrhea: Secondary | ICD-10-CM

## 2015-08-16 DIAGNOSIS — R197 Diarrhea, unspecified: Secondary | ICD-10-CM

## 2015-08-16 DIAGNOSIS — R1032 Left lower quadrant pain: Secondary | ICD-10-CM | POA: Diagnosis not present

## 2015-08-16 LAB — IGA: IGA: 97 mg/dL (ref 68–378)

## 2015-08-16 MED ORDER — BUDESONIDE 9 MG PO TB24: 9.0000 mg | ORAL_TABLET | Freq: Every day | ORAL | Status: DC
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
## 2015-08-16 NOTE — Assessment & Plan Note (Signed)
?   Try mirtazapine

## 2015-08-16 NOTE — Patient Instructions (Addendum)
   Your physician has requested that you go to the basement for the following lab work before leaving today: TTG, IGA  We have sent the following medications to your pharmacy for you to pick up at your convenience: uceris  Take the generic zofran you have before meals for nausea and diarrhea.   Follow up with Dr Carlean Purl in 2 months.   I appreciate the opportunity to care for you. Silvano Rusk, MD, [facility]

## 2015-08-17 LAB — C. DIFFICILE GDH AND TOXIN A/B
C. DIFFICILE GDH: NOT DETECTED
C. difficile Toxin A/B: NOT DETECTED

## 2015-08-17 LAB — TISSUE TRANSGLUTAMINASE, IGA: Tissue Transglutaminase Ab, IgA: 1 U/mL (ref <4)

## 2015-08-20 LAB — STOOL CULTURE

## 2015-08-22 ENCOUNTER — Ambulatory Visit: Payer: Commercial Managed Care - HMO | Admitting: Internal Medicine

## 2015-08-22 LAB — GIARDIA/CRYPTOSPORIDIUM (EIA)

## 2015-08-27 ENCOUNTER — Telehealth: Payer: Self-pay | Admitting: Internal Medicine

## 2015-08-27 NOTE — Telephone Encounter (Signed)
1 week after 08/16/15 appt with Dr Carlean Purl the pt  took Uceris became constipated and used 2 suppositories, 3 stool softeners, and 2 enemas before she had a bowel movement.  She now has some diarrhea that has gotten better.  She stopped Uceris for 1 week and has now took 1 dose of Uceris today  but wants to know if she should continue to take the Uceris or stop the Uceris and  wait for follow up with Dr Carlean Purl.  Please advise

## 2015-08-27 NOTE — Telephone Encounter (Signed)
Uceris decision would be dependent on her diarrhea. He use this medication because she has history of lymphocytic colitis. If Uceris results in constipation then I would hold it.  If diarrhea continues to be a problem I would try to resume. Entocort/or budesonide is another form of the same medication with a lower dose option. We can try this if she continues to have diarrhea and Uceris is "too strong"

## 2015-08-27 NOTE — Telephone Encounter (Signed)
Pt notified of Dr Hilarie Fredrickson recommendations and will call back if the diarrhea is too strong.[name]

## 2015-08-31 ENCOUNTER — Encounter: Payer: Self-pay | Admitting: Internal Medicine

## 2015-08-31 ENCOUNTER — Ambulatory Visit (INDEPENDENT_AMBULATORY_CARE_PROVIDER_SITE_OTHER): Payer: Commercial Managed Care - HMO | Admitting: Internal Medicine

## 2015-08-31 VITALS — BP 100/58 | HR 62 | Ht 68.5 in | Wt 128.6 lb

## 2015-08-31 DIAGNOSIS — G4733 Obstructive sleep apnea (adult) (pediatric): Secondary | ICD-10-CM

## 2015-08-31 DIAGNOSIS — K219 Gastro-esophageal reflux disease without esophagitis: Secondary | ICD-10-CM

## 2015-08-31 DIAGNOSIS — K449 Diaphragmatic hernia without obstruction or gangrene: Secondary | ICD-10-CM

## 2015-08-31 NOTE — Assessment & Plan Note (Addendum)
She didn't like initial experience with CPAP in the past. We might be able to do better with current equipment but she has good teeth and mild OSA. I think she could be a good candidate for an oral appliance. She also asked for information about head straps and OTC mouthpieces. Plan-refer for consideration of oral appliance to treat OSA

## 2015-08-31 NOTE — Progress Notes (Signed)
07/12/2015-70 year old female former smoker referred courtesy of Dr. Toney Rakes for sleep medicine evaluation Complains of difficulty initiating and maintaining sleep, loud snoring, witnessed apnea.  Evaluated many years ago for sleep disturbance with sleep study-report no longer available. She was given CPAP machine then but found it aggravating. Complicating medical problems include GERD Husband tells her she snores. Sleep is also fragmented by frequent nocturia. Admits daytime "tiredness" but does not nap. One cup of coffee in the morning. Trazodone at bedtime. Difficulty initiating sleep-30-40 minutes. Recently added Prozac. She feels she has "no energy" and attributes that to depression. ENT surgery-sinuses. She still has tonsils. Seasonal allergic rhinitis with nasal congestion and drainage inadequately treated with Flonase denies lung disease. Sleeps on a Tempur-Pedic bed. She and husband can operate their respective sides separately and she sleeps with head elevated.  08/31/2015-70 year old female former smoker followed for OSA, difficulty sleeping complicated by frequent nocturia FOLLOW FOR:  Sleep Study results.  discuss Vit D. Unattended Home Sleep Test 08/09/2015-[facility] 10.9/hour, desaturation to 78%, body weight 132 pounds She didn't like experience with CPAP when first tried after initial sleep study years ago. We discussed alternatives. She mentions some nocturnal cough that may be associated with mild reflux. I suggested OTC Pepcid and reflux precautions until she can discuss with her PCP.  ROS-see HPI   Negative unless "+" Constitutional:    weight loss, night sweats, fevers, chills, fatigue, lassitude. HEENT:    headaches, difficulty swallowing, tooth/dental problems, sore throat,       sneezing, itching, ear ache, nasal congestion, post nasal drip, snoring CV:    chest pain, orthopnea, PND, swelling in lower extremities, anasarca,                                      dizziness, palpitations Resp:   shortness of breath with exertion or at rest.                productive cough,   non-productive cough, coughing up of blood.              change in color of mucus.  wheezing.   Skin:    rash or lesions. GI:  No-   heartburn, indigestion, abdominal pain, nausea, vomiting, diarrhea,                 change in bowel habits, loss of appetite GU: dysuria, change in color of urine, no urgency or frequency.   flank pain. MS:   joint pain, stiffness, decreased range of motion, back pain. Neuro-     nothing unusual Psych:  change in mood or affect.  depression or anxiety.   memory loss.  OBJ- Physical Exam General- Alert, Oriented, Affect-appropriate, Distress- none acute, slender Skin- rash-none, lesions- none, excoriation- none Lymphadenopathy- none Head- atraumatic            Eyes- Gross vision intact, PERRLA, conjunctivae and secretions clear            Ears- Hearing, canals-normal            Nose- , + turbinate edema, no-Septal dev, mucus, polyps, erosion, perforation             Throat- Mallampati II-III , mucosa clear , drainage- none, tonsils- atrophic, + own teeth Neck- flexible , trachea midline, no stridor , thyroid nl, carotid no bruit Chest - symmetrical excursion , unlabored  Heart/CV- RRR , no murmur , no gallop  , no rub, nl s1 s2                           - JVD- none , edema- none, stasis changes- none, varices- none           Lung- clear to P&A, wheeze- none, cough- none , dullness-none, rub- none           Chest wall-  Abd-  Br/ Gen/ Rectal- Not done, not indicated Extrem- cyanosis- none, clubbing, none, atrophy- none, strength- nl Neuro- grossly intact to observation

## 2015-08-31 NOTE — Assessment & Plan Note (Signed)
She asked for Nexium and I recommended she ask her PCP for this. Meanwhile she can try OTC Pepcid and emphasized reflux precautions.

## 2015-08-31 NOTE — Patient Instructions (Signed)
Order- referral to Orthodontist Dr Oneal Grout consider an oral appliance for OSA   You can try a chin strap for snoring

## 2015-09-07 ENCOUNTER — Ambulatory Visit (INDEPENDENT_AMBULATORY_CARE_PROVIDER_SITE_OTHER): Payer: Commercial Managed Care - HMO | Admitting: Internal Medicine

## 2015-09-07 ENCOUNTER — Other Ambulatory Visit: Payer: Self-pay

## 2015-09-07 ENCOUNTER — Encounter: Payer: Self-pay | Admitting: Internal Medicine

## 2015-09-07 VITALS — BP 110/80 | HR 64 | Temp 97.6°F | Resp 16 | Wt 128.0 lb

## 2015-09-07 DIAGNOSIS — F418 Other specified anxiety disorders: Secondary | ICD-10-CM | POA: Diagnosis not present

## 2015-09-07 DIAGNOSIS — K582 Mixed irritable bowel syndrome: Secondary | ICD-10-CM | POA: Diagnosis not present

## 2015-09-07 MED ORDER — TRAZODONE HCL 150 MG PO TABS: ORAL_TABLET | ORAL | Status: DC

## 2015-09-07 NOTE — Telephone Encounter (Signed)
Called into pharmacy

## 2015-09-07 NOTE — Assessment & Plan Note (Signed)
Having alternating diarrhea and constipation associated with abdominal cramping Will follow up with GI

## 2015-09-07 NOTE — Assessment & Plan Note (Signed)
Declined trying mirtazapine or other new medication because of concern with side effects Using xanax as needed which is not often - she does not want to take it or become addicted to it Agrees to speak with a psychologist - will refer today Follow up if she wants to consider a different medication

## 2015-09-07 NOTE — Patient Instructions (Addendum)
   Medications reviewed and updated.  No changes recommended at this time.     

## 2015-09-07 NOTE — Progress Notes (Signed)
Subjective:    Patient ID: [name], female    DOB: 1946-01-11, 70 y.o.   MRN: [id number]  HPI She is here for follow up.  Depression and anxiety:  She is taking xanax only as needed - and only has used it it once in a while. She has been keeping very busy and feels that is helping.  She is doing a lot of gardening.  She still has a lot of stress and family issues and thinks she may benefit from talking with a psychologist.   Diarrhea:  She has seen GI and he felt she probably has a combination of IBS and lymphocytic colitis. She has alternating diarrhea and constipation.  She has intermittent abdominal pain /cramping. She has needed to take stool softeners and fleet enemas for severe constipation.  She has a follow up with GI.    Medications and allergies reviewed with patient and updated if appropriate.  Patient Active Problem List   Diagnosis Date Noted  . Nausea without vomiting 08/15/2015  . Generalized abdominal pain 08/15/2015  . Diarrhea 08/15/2015  . Obstructive sleep apnea 07/12/2015  . Seasonal and perennial allergic rhinitis 07/12/2015  . Chest pain 04/26/2015  . Anemia 12/05/2014  . Vitamin D deficiency 12/05/2014  . Sinusitis, chronic 05/30/2014    Class: Chronic  . Tick bite of right popliteal region 11/04/2013  . Hypokalemia 03/28/2013  . Vaginal atrophy 10/20/2012  . Insomnia 10/20/2012  . Orthostatic hypotension 02/16/2012  . Chronic LLQ pain 05/20/2011  . Cough 02/13/2011  . Palpitations 12/31/2010  . GERD (gastroesophageal reflux disease) 07/25/2010  . Depression with anxiety 07/25/2010  . Lymphocytic colitis 06/14/2010  . Gastroesophageal reflux disease with hiatal hernia   . ANEMIA, MILD 01/15/2010  . BACK PAIN, CHRONIC 12/27/2009  . OSTEOPENIA 12/27/2009  . ABDOMINAL BRUIT 09/14/2009  . 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 Fatigue 06/25/2009  . DYSPNEA/SHORTNESS OF BREATH 06/25/2009  . CHEST PAIN-PRECORDIAL  12/12/2008  . Hereditary and idiopathic peripheral neuropathy 12/22/2007  . OTHER ABNORMAL GLUCOSE 12/22/2007  . Hyperlipidemia 03/09/2007  . Generalized anxiety disorder 03/09/2007  . MIGRAINE HEADACHE 03/09/2007  . IBS 03/09/2007  . SPONDYLOSIS 03/09/2007  . Myalgia and myositis 03/09/2007  . SCHATZKI'S RING, HX OF 03/09/2007  . HIATAL HERNIA WITH REFLUX 12/30/2006  . GASTRITIS 02/17/2005  . DIVERTICULOSIS OF COLON 01/01/2000    Current Outpatient Prescriptions on File Prior to Visit  Medication Sig Dispense Refill  . ALPRAZolam (XANAX) 0.25 MG tablet Take 1 tablet (0.25 mg total) by mouth daily as needed for anxiety. 30 tablet 0  . Azelastine-Fluticasone (DYMISTA) 137-50 MCG/ACT SUSP Place 2 sprays into both nostrils at bedtime. 1 Bottle 0  . b complex vitamins capsule Take 1 capsule by mouth daily.    . Biotin 1000 MCG tablet Take 1,000 mcg by mouth daily.      . Budesonide (UCERIS) 9 MG TB24 Take 9 mg by mouth daily. 30 tablet 2  . fluticasone (FLONASE) 50 MCG/ACT nasal spray Place 1 spray into both nostrils 2 (two) times daily.    . Magnesium Oxide 250 MG TABS Take 250 mg by mouth daily.     . ondansetron (ZOFRAN ODT) 4 MG disintegrating tablet Take 1 tablet (4 mg total) by mouth every 8 (eight) hours as needed for nausea or vomiting. 30 tablet 0  . Probiotic Product (ALIGN) 4 MG CAPS Take 1 capsule by mouth daily.     . sodium chloride (OCEAN) 0.65 % SOLN  nasal spray Place 1 spray into both nostrils as needed for congestion.    . traZODone (DESYREL) 150 MG tablet Take 1-2 tablets by mouth at bedtime 30 tablet 0  . vitamin C (ASCORBIC ACID) 500 MG tablet Take 500 mg by mouth daily.    . Vitamin D, Ergocalciferol, (DRISDOL) 50000 units CAPS capsule Take 1 capsule (50,000 Units total) by mouth every 7 (seven) days. 12 capsule 0  . [DISCONTINUED] colesevelam (WELCHOL) 625 MG tablet Take 625 mg by mouth 2 (two) times daily with a meal. Take one tablet by mouth twice a day    .  [DISCONTINUED] Hyoscyamine-Phenyltoloxamine ([place] NF) [id number] MG CAPS Take one tablet by mouth as needed for abdominal cramping 24 each 0   No current facility-administered medications on file prior to visit.    Past Medical History  Diagnosis Date  . Diverticulosis   . Gastroesophageal reflux disease with hiatal hernia   . IBS (irritable bowel syndrome)   . Hyperlipidemia   . Spondylosis   . Migraine headache   . Schatzki's ring     History of  . Osteopenia     BMD ordered by GYN  . Mitral valve prolapse   . Depression     generalized anxiety disorder  . Hiatal hernia   . Fibromyalgia   . Peripheral neuropathy ([place])     treated as RLS by  Neurology  . Lymphocytic colitis     Dr Sharlett Iles  . Duodenal diverticulum   . Anxiety   . Restless leg syndrome   . Complication of anesthesia   . PONV (postoperative nausea and vomiting)   . Family history of adverse reaction to anesthesia     Mother and Daughters- N/V  . Sleep apnea     On CPAP, has not been using  . History of kidney stones   . Neuropathy [facility])     Past Surgical History  Procedure Laterality Date  . Tubal ligation    . Cholecystectomy    . Vaginal cystectomy      x 2  . Rotator cuff repair Left   . Sinus surgery with instatrak      x 2  . Esophageal dilation      X 2  . Breast enhancement surgery    . Cataract surgery Left     Dr Bing Plume  . Colonoscopy    . Cataract extraction Bilateral   . Vaginal hysterectomy  05/2007    Vaginal repair, Dr Ubaldo Glassing.  Partial  hysterectomy.  . Sinus endo w/fusion N/A 05/30/2014    Procedure: REVISION  FRONTAL SINUS SURGERY WITH FUSION SCAN;  Surgeon: Jerrell Belfast, MD;  Location: [facility];  Service: ENT;  Laterality: N/A;    Social History   Social History  . Marital Status: Married    Spouse Name: N/A  . Number of Children: 2  . Years of Education: 18   Occupational History  . retired [employer]   Social History Main Topics  . Smoking status: Former Smoker --  0.50 packs/day for 15 years    Types: Cigarettes    Quit date: 03/03/1998  . Smokeless tobacco: Never Used     Comment: smoked 1973- 2006, up to <  1  ppd  . Alcohol Use: No  . Drug Use: No  . Sexual Activity: Yes   Other Topics Concern  . Not on file   Social History Narrative   HAS REGULAR EXERCISE   DAILY CAFFEINE: 1-2 CUPS  Patient is right handed.    Family History  Problem Relation Age of Onset  . Hypertension Father 1  . Stroke Father 14  . Heart attack Father      ? in 21s  . Hypertension Mother   . Neuropathy Mother   . Anemia Mother   . Coronary artery disease Brother     Stent placement in 60s  . Colon cancer Neg Hx   . Seizures Neg Hx   . Diabetes Maternal Uncle   . Heart attack Paternal Uncle     SEVEN , ? age    Review of Systems  Constitutional: Positive for unexpected weight change (4 lbs ). Negative for appetite change.  Gastrointestinal: Positive for abdominal pain (with cramping with diarrhea or constipation), diarrhea and constipation.  Psychiatric/Behavioral: Positive for dysphoric mood (not sure if she feels depressed or not). The patient is nervous/anxious.        Objective:   Filed Vitals:   09/07/15 1355  BP: 110/80  Pulse: 64  Temp: 97.6 F (36.4 C)  Resp: 16   Filed Weights   09/07/15 1355  Weight: 128 lb (58.06 kg)   Body mass index is 19.18 kg/(m^2).   Physical Exam  Constitutional: She is oriented to person, place, and time. She appears well-developed and well-nourished. No distress.  Abdominal: Soft. She exhibits no distension. There is no tenderness. There is no rebound and no guarding.  Neurological: She is alert and oriented to person, place, and time.  Skin: Skin is warm and dry. She is not diaphoretic.  Psychiatric: She has a normal mood and affect. Her behavior is normal.          Assessment & Plan:   See Problem List for Assessment and Plan of chronic medical problems.  F/u as needed

## 2015-09-07 NOTE — Progress Notes (Signed)
Pre visit review using our clinic review tool, if applicable. No additional management support is needed unless otherwise documented below in the visit note. 

## 2015-09-17 ENCOUNTER — Ambulatory Visit: Payer: Commercial Managed Care - HMO | Admitting: Cardiovascular Disease

## 2015-09-27 ENCOUNTER — Telehealth: Payer: Self-pay | Admitting: Internal Medicine

## 2015-09-27 DIAGNOSIS — F329 Major depressive disorder, single episode, unspecified: Secondary | ICD-10-CM

## 2015-09-27 DIAGNOSIS — F32A Depression, unspecified: Secondary | ICD-10-CM

## 2015-09-27 DIAGNOSIS — F419 Anxiety disorder, unspecified: Secondary | ICD-10-CM

## 2015-09-27 NOTE — Telephone Encounter (Signed)
States someone called her in regard to a referral.  Spoke to the referral coordinators and this was not them.  Do you know who called her?

## 2015-10-01 ENCOUNTER — Ambulatory Visit: Payer: Commercial Managed Care - HMO | Admitting: Cardiovascular Disease

## 2015-10-01 NOTE — Telephone Encounter (Signed)
I do no think she requested the referral to be cancelled - I do not recall.  Call her and see - cancel it if she wants it cancelled.

## 2015-10-01 NOTE — Telephone Encounter (Signed)
Did pt request for psych referral to be cancelled.

## 2015-10-02 NOTE — Telephone Encounter (Signed)
Referral reordered.

## 2015-10-02 NOTE — Telephone Encounter (Signed)
Pt does not want the referral cancelled. Will you please re enter.

## 2015-10-15 ENCOUNTER — Telehealth: Payer: Self-pay | Admitting: Internal Medicine

## 2015-10-15 DIAGNOSIS — G4733 Obstructive sleep apnea (adult) (pediatric): Secondary | ICD-10-CM

## 2015-10-15 NOTE — Telephone Encounter (Signed)
Called spoke with pt. appt scheduled. Nothing further needed 

## 2015-10-15 NOTE — Telephone Encounter (Signed)
Spoke with pt. States that her insurance will not cover an oral appliance. Pt would like to see about getting set up with CPAP. States that she has already had a sleep study and should not have to repeat that.  CY - please advise. Thanks.

## 2015-10-15 NOTE — Telephone Encounter (Signed)
lmtcb x1 for pt. 

## 2015-10-15 NOTE — Telephone Encounter (Signed)
01/03/16 at 4:15pm for 30 minute OV. Thanks.

## 2015-10-15 NOTE — Telephone Encounter (Signed)
Pt called back. Order placed for CPAP. Katie, please advise work in appointment for patient. Thanks

## 2015-10-15 NOTE — Telephone Encounter (Signed)
Ok to order new DME, new CPAP, auto 5-15, mask of choice, humidifier, supplies, AirView     Dx OSA  She will need CPAP follow up visit at this office 31-90 days from now

## 2015-11-06 ENCOUNTER — Ambulatory Visit (INDEPENDENT_AMBULATORY_CARE_PROVIDER_SITE_OTHER): Payer: Commercial Managed Care - HMO | Admitting: Internal Medicine

## 2015-11-06 ENCOUNTER — Encounter: Payer: Self-pay | Admitting: Internal Medicine

## 2015-11-06 VITALS — BP 104/80 | HR 60 | Ht 68.5 in | Wt 130.8 lb

## 2015-11-06 DIAGNOSIS — K589 Irritable bowel syndrome without diarrhea: Secondary | ICD-10-CM | POA: Diagnosis not present

## 2015-11-06 DIAGNOSIS — N8184 Pelvic muscle wasting: Secondary | ICD-10-CM | POA: Diagnosis not present

## 2015-11-06 DIAGNOSIS — K648 Other hemorrhoids: Secondary | ICD-10-CM

## 2015-11-06 DIAGNOSIS — K641 Second degree hemorrhoids: Secondary | ICD-10-CM

## 2015-11-06 DIAGNOSIS — M6289 Other specified disorders of muscle: Secondary | ICD-10-CM

## 2015-11-06 NOTE — Patient Instructions (Signed)
  You have been scheduled to have an anorectal manometry at [facility] on 11/14/15 at 12:30pm. Please arrive 30 minutes prior to your appointment time for registration (1st floor of the hospital-admissions).  Please make certain to use 1 Fleets enema 2 hours prior to coming for your appointment. You can purchase Fleets enemas from the laxative section at your drug store. You should not eat anything during the two hours prior to the procedure. You may take regular medications with small sips of water at least 2 hours prior to the study.  Anorectal manometry is a test performed to evaluate patients with constipation or fecal incontinence. This test measures the pressures of the anal sphincter muscles, the sensation in the rectum, and the neural reflexes that are needed for normal bowel movements.  THE PROCEDURE The test takes approximately 30 minutes to 1 hour. You will be asked to change into a hospital gown. A technician or nurse will explain the procedure to you, take a brief health history, and answer any questions you may have. The patient then lies on his or her left side. A small, flexible tube, about the size of a thermometer, with a balloon at the end is inserted into the rectum. The catheter is connected to a machine that measures the pressure. During the test, the small balloon attached to the catheter may be inflated in the rectum to assess the normal reflex pathways. The nurse or technician may also ask the person to squeeze, relax, and push at various times. The anal sphincter muscle pressures are measured during each of these maneuvers. To squeeze, the patient tightens the sphincter muscles as if trying to prevent anything from coming out. To push or bear down, the patient strains down as if trying to have a bowel movement.      I appreciate the opportunity to care for you. Silvano Rusk, MD, [facility]

## 2015-11-06 NOTE — Progress Notes (Signed)
  Assessment & Plan:   Encounter Diagnoses  Name Primary?  . IBS (irritable bowel syndrome) Yes  . 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 Prolapsed internal hemorrhoids, grade 2    Continue Fiber  My dx of  lymphocytic colitis made in June must have been incorrect as diarrhea quickly abated w/ in 1 week of starting Uceris and she was actually constipated.  Seems like she has pelvic floor dysfx w/ altered bowels and suspected by rectal exam  Also has difficulty emptying bladder - only a little bit comes out which fits with pelvic floor dysfx also  She will have an anorectal manometry - pending results consider pelvic floor PT  Observe hemorrhoids for now  See me prn   Subjective:    Patient ID: [name], female    DOB: 1945-12-25, 70 y.o.   MRN: [id number]  HPI Here for f/u - was seen in June w/ several weeks of diarrhea - I suspected lymphocytic colitis and IBS - Uceris Rx - diarrhea gone and severely constipated after 1 week tx so stopped. Had swollen anal area and discomfort theer afterwards - ? Hemorrhoids. No bleeding. Alternating bowel habits now - ok - using fiber - no persistent extresmes  Nausea much better  GU - urinary frequency and limited volume - has a small bladder - had it stretched at [facility] yrs ago  Medications, allergies, past medical history, past surgical history, family history and social history are reviewed and updated in the EMR.  Review of Systems As above    Objective:   Physical Exam BP 104/80 (BP Location: Left Arm, Patient Position: Sitting, Cuff Size: Normal)   Pulse 60   Ht 5' 8.5" (1.74 m)   Wt 130 lb 12.8 oz (59.3 kg)   BMI 19.60 kg/m  NAD  [name], CMA present.  Rectal: Small anal tags o/w NL anoderm NL resting tone, no mass or tenderness and no rectocele Voluntary squeeze seems reduced and there is some paradoxical relaxation and incomplete relaxation and descent with simulated decfecaton   Anoscopy was  performed with the patient in the left lateral decubitus position while a chaperone was present and revealed Gr 2 RP internal hemorrhoid column and Gr 1 RA and LL     I appreciate the opportunity to care for this patient. Cc;Heather Lorretta Harp, MD

## 2015-11-07 ENCOUNTER — Telehealth: Payer: Self-pay | Admitting: Internal Medicine

## 2015-11-07 NOTE — Telephone Encounter (Signed)
error 

## 2015-11-09 ENCOUNTER — Encounter: Payer: Self-pay | Admitting: Gynecology

## 2015-11-09 ENCOUNTER — Ambulatory Visit (INDEPENDENT_AMBULATORY_CARE_PROVIDER_SITE_OTHER): Payer: Commercial Managed Care - HMO | Admitting: Gynecology

## 2015-11-09 VITALS — BP 118/74 | Ht 68.5 in | Wt 129.0 lb

## 2015-11-09 DIAGNOSIS — N952 Postmenopausal atrophic vaginitis: Secondary | ICD-10-CM | POA: Diagnosis not present

## 2015-11-09 DIAGNOSIS — M8580 Other specified disorders of bone density and structure, unspecified site: Secondary | ICD-10-CM

## 2015-11-09 DIAGNOSIS — R300 Dysuria: Secondary | ICD-10-CM | POA: Diagnosis not present

## 2015-11-09 DIAGNOSIS — R350 Frequency of micturition: Secondary | ICD-10-CM

## 2015-11-09 DIAGNOSIS — Z8639 Personal history of other endocrine, nutritional and metabolic disease: Secondary | ICD-10-CM | POA: Diagnosis not present

## 2015-11-09 DIAGNOSIS — N898 Other specified noninflammatory disorders of vagina: Secondary | ICD-10-CM

## 2015-11-09 DIAGNOSIS — N3000 Acute cystitis without hematuria: Secondary | ICD-10-CM | POA: Diagnosis not present

## 2015-11-09 DIAGNOSIS — G4700 Insomnia, unspecified: Secondary | ICD-10-CM | POA: Diagnosis not present

## 2015-11-09 DIAGNOSIS — Z01419 Encounter for gynecological examination (general) (routine) without abnormal findings: Secondary | ICD-10-CM

## 2015-11-09 LAB — URINALYSIS W MICROSCOPIC + REFLEX CULTURE
BILIRUBIN URINE: NEGATIVE
Casts: NONE SEEN [LPF]
Crystals: NONE SEEN [HPF]
Glucose, UA: NEGATIVE
KETONES UR: NEGATIVE
NITRITE: NEGATIVE
PH: 6.5 (ref 5.0–8.0)
Protein, ur: NEGATIVE
Yeast: NONE SEEN [HPF]

## 2015-11-09 MED ORDER — CIPROFLOXACIN HCL 250 MG PO TABS: 250.0000 mg | ORAL_TABLET | Freq: Two times a day (BID) | ORAL | 0 refills | Status: DC

## 2015-11-09 MED ORDER — ESTRADIOL 10 MCG VA TABS: 1.0000 | ORAL_TABLET | VAGINAL | 11 refills | Status: DC

## 2015-11-09 MED ORDER — TRAZODONE HCL 150 MG PO TABS: ORAL_TABLET | ORAL | 2 refills | Status: DC

## 2015-11-09 MED ORDER — PHENAZOPYRIDINE HCL 200 MG PO TABS: 200.0000 mg | ORAL_TABLET | Freq: Three times a day (TID) | ORAL | 0 refills | Status: DC | PRN

## 2015-11-09 NOTE — Progress Notes (Signed)
[name] Mar 27, 1945 [id number]   History:    70 y.o.  for annual gyn exam who has several complaints today as follows: #1 patient for the past few days has been complaining of dysuria and frequency no back pain, no fever, chills, nausea, vomiting #2 patient complaining of vaginal dryness irritation discomfort also during intercourse has not been on any vaginal estrogen for quite some time would like to restart. #3 patient with past history vitamin D deficiency April of this year her vitamin D level was found to be low at 20 she was started on vitamin D 50,000 units every weekly for 3 months and is here today to check her vitamin D level as well. #4 patient with history of osteopenia had bone density this year lowest T score was -2.2 on the left femoral neck and had a normal Frax analysis. #5 patient for many years has suffered with insomnia and has recently been seen at the sleep clinic and is currently being evaluated and she needs a prescription refill for trazodone to help her sleep until her evaluation treatment is completed.  In 2009 of a vaginal hysterectomy with anterior and posterior repair as well as enterocele repair and colposuspension. Patient reports a normal colonoscopy in 2012. Prior to her hysterectomy she reports normal Pap smears. Her PCP we'll be doing her lab work next few weeks along with her flu vaccine. She will check with him also on the shingles vaccine.  Past medical history,surgical history, family history and social history were all reviewed and documented in the EPIC chart.  Gynecologic History No LMP recorded. Patient has had a hysterectomy. Contraception: status post hysterectomy Last Pap: 2013. Results were: normal Last mammogram: 2016. Results were: normal  Obstetric History OB History  Gravida Para Term Preterm AB Living  2 2       2   SAB TAB Ectopic Multiple Live Births               # Outcome Date GA Lbr Len/2nd Weight Sex Delivery Anes  PTL Lv  2 Para           1 Para                ROS: A ROS was performed and pertinent positives and negatives are included in the history.  GENERAL: No fevers or chills. HEENT: No change in vision, no earache, sore throat or sinus congestion. NECK: No pain or stiffness. CARDIOVASCULAR: No chest pain or pressure. No palpitations. PULMONARY: No shortness of breath, cough or wheeze. GASTROINTESTINAL: No abdominal pain, nausea, vomiting or diarrhea, melena or bright red blood per rectum. GENITOURINARY: No urinary frequency, urgency, hesitancy or dysuria. MUSCULOSKELETAL: No joint or muscle pain, no back pain, no recent trauma. DERMATOLOGIC: No rash, no itching, no lesions. ENDOCRINE: No polyuria, polydipsia, no heat or cold intolerance. No recent change in weight. HEMATOLOGICAL: No anemia or easy bruising or bleeding. NEUROLOGIC: No headache, seizures, numbness, tingling or weakness. PSYCHIATRIC: No depression, no loss of interest in normal activity or change in sleep pattern.     Exam: chaperone present  BP 118/74   Ht 5' 8.5" (1.74 m)   Wt 129 lb (58.5 kg)   BMI 19.33 kg/m   Body mass index is 19.33 kg/m.  General appearance : Well developed well nourished female. No acute distress HEENT: Eyes: no retinal hemorrhage or exudates,  Neck supple, trachea midline, no carotid bruits, no thyroidmegaly Lungs: Clear to auscultation, no rhonchi or wheezes, or rib  retractions  Heart: Regular rate and rhythm, no murmurs or gallops Breast:Examined in sitting and supine position were symmetrical in appearance, no palpable masses or tenderness,  no skin retraction, no nipple inversion, no nipple discharge, no skin discoloration, no axillary or supraclavicular lymphadenopathy Abdomen: no palpable masses or tenderness, no rebound or guarding Extremities: no edema or skin discoloration or tenderness  Pelvic:  Bartholin, Urethra, Skene Glands: Within normal limits             Vagina: No gross lesions or  discharge, atrophic changes  Cervix: Absent  Uterus  absent  Adnexa  Without masses or tenderness  Anus and perineum  normal   Rectovaginal  normal sphincter tone without palpated masses or tenderness             Hemoccult PCP provides   Urinalysis: Moderate bacteria, 10-20 WBC, 0-20 RBC  Assessment/Plan:  70 y.o. female for annual exam with clinical evidence of urinary tract infection she will be prescribed Cipro 250 mg twice a day for 3 days as well as Pyridium 200 mg 3 times a day for 3 days for bladder spasm. Because of her history vitamin D deficiency and recently completed a three-month treatment for vitamin D level be checked today. Written instruction for her to take 600 mg of calcium daily in 2000 units of vitamin D was provided as well as. For her vaginal atrophy she is going to be prescribed Vagifem 10 g to apply intravaginally twice a week. Risk benefits and pros and cons were discussed and literature information was provided. Patient was reminded to schedule her mammogram and to follow-up with her PCP for her flu vaccine and her blood work. For her insomnia she was instructed to follow-up in the sleep clinic and she will be given a prescription refill for trazodone for 30 days and 2 refills and no further refills after that.   Terrance Mass MD, 11:39 AM 11/09/2015

## 2015-11-09 NOTE — Patient Instructions (Addendum)
Estradiol vaginal tablets What is this medicine? ESTRADIOL (es tra DYE ole) vaginal tablet is used to help relieve symptoms of vaginal irritation and dryness that occurs in some women during menopause. This medicine may be used for other purposes; ask your health care provider or pharmacist if you have questions. What should I tell my health care provider before I take this medicine? They need to know if you have any of these conditions: -abnormal vaginal bleeding -blood vessel disease or blood clots -breast, cervical, endometrial, ovarian, liver, or uterine cancer -dementia -diabetes -gallbladder disease -heart disease or recent heart attack -high blood pressure -high cholesterol -high level of calcium in the blood -hysterectomy -kidney disease -liver disease -migraine headaches -protein C deficiency -protein S deficiency -stroke -systemic lupus erythematosus (SLE) -tobacco smoker -an unusual or allergic reaction to estrogens, other hormones, medicines, foods, dyes, or preservatives -pregnant or trying to get pregnant -breast-feeding How should I use this medicine? This medicine is only for use in the vagina. Do not take by mouth. Wash and dry your hands before and after use. Read package directions carefully. Unwrap the applicator package. Be sure to use a new applicator for each dose. Use at the same time each day. If the tablet has fallen out of the applicator, but is still in the package, carefully place it back into the applicator. If the tablet has fallen out of the package, that applicator should be thrown out and you should use a new applicator containing a new tablet. Lie on your back, part and bend your knees. Gently insert the applicator as far as comfortably possible into the vagina. Then, gently press the plunger until the plunger is fully depressed. This will release the tablet into the vagina. Gently remove the applicator. Throw away the applicator after use. Do not use  your medicine more often than directed. Do not stop using except on the advice of your doctor or health care professional. Talk to your pediatrician regarding the use of this medicine in children. This medicine is not approved for use in children. A patient package insert for the product will be given with each prescription and refill. Read this sheet carefully each time. The sheet may change frequently. Overdosage: If you think you have taken too much of this medicine contact a poison control center or emergency room at once. NOTE: This medicine is only for you. Do not share this medicine with others. What if I miss a dose? If you miss a dose, take it as soon as you can. If it is almost time for your next dose, take only that dose. Do not take double or extra doses. What may interact with this medicine? Do not take this medicine with any of the following medications: -aromatase inhibitors like aminoglutethimide, anastrozole, exemestane, letrozole, testolactone This medicine may also interact with the following medications: -antibiotics used to treat tuberculosis like rifabutin, rifampin and rifapentene -raloxifene or tamoxifen -warfarin This list may not describe all possible interactions. Give your health care provider a list of all the medicines, herbs, non-prescription drugs, or dietary supplements you use. Also tell them if you smoke, drink alcohol, or use illegal drugs. Some items may interact with your medicine. What should I watch for while using this medicine? Visit your health care professional for regular checks on your progress. You will need a regular breast and pelvic exam. You should also discuss the need for regular mammograms with your health care professional, and follow his or her guidelines. This medicine can make  your body retain fluid, making your fingers, hands, or ankles swell. Your blood pressure can go up. Contact your doctor or health care professional if you feel you are  retaining fluid. If you have any reason to think you are pregnant; stop taking this medicine at once and contact your doctor or health care professional. Tobacco smoking increases the risk of getting a blood clot or having a stroke, especially if you are more than 72 years old. You are strongly advised not to smoke. If you wear contact lenses and notice visual changes, or if the lenses begin to feel uncomfortable, consult your eye care specialist. If you are going to have elective surgery, you may need to stop taking this medicine beforehand. Consult your health care professional for advice prior to scheduling the surgery. What side effects may I notice from receiving this medicine? Side effects that you should report to your doctor or health care professional as soon as possible: -allergic reactions like skin rash, itching or hives, swelling of the face, lips, or tongue -breast tissue changes or discharge -changes in vision -chest pain -confusion, trouble speaking or understanding -dark urine -general ill feeling or flu-like symptoms -light-colored stools -nausea, vomiting -pain, swelling, warmth in the leg -right upper belly pain -severe headaches -shortness of breath -sudden numbness or weakness of the face, arm or leg -trouble walking, dizziness, loss of balance or coordination -unusual vaginal bleeding -yellowing of the eyes or skin Side effects that usually do not require medical attention (report to your doctor or health care professional if they continue or are bothersome): -hair loss -increased hunger or thirst -increased urination -symptoms of vaginal infection like itching, irritation or unusual discharge -unusually weak or tired This list may not describe all possible side effects. Call your doctor for medical advice about side effects. You may report side effects to FDA at 1-800-FDA-1088. Where should I keep my medicine? Keep out of the reach of children. Store at room  temperature between 15 and 30 degrees C (59 and 86 degrees F). Throw away any unused medicine after the expiration date. NOTE: This sheet is a summary. It may not cover all possible information. If you have questions about this medicine, talk to your doctor, pharmacist, or health care provider.    2016, Elsevier/Gold Standard. (2014-02-01 09:22:51) Shingles Vaccine: What You Need to Know WHAT IS SHINGLES?  Shingles is a painful skin rash, often with blisters. It is also called Herpes Zoster or just Zoster.  A shingles rash usually appears on one side of the face or body and lasts from 2 to 4 weeks. Its main symptom is pain, which can be quite severe. Other symptoms of shingles can include fever, headache, chills, and upset stomach. Very rarely, a shingles infection can lead to pneumonia, hearing problems, blindness, brain inflammation (encephalitis), or death.  For about 1 person in 5, severe pain can continue even after the rash clears up. This is called post-herpetic neuralgia.  Shingles is caused by the Varicella Zoster virus. This is the same virus that causes chickenpox. Only someone who has had a case of chickenpox or rarely, has gotten chickenpox vaccine, can get shingles. The virus stays in your body. It can reappear many years later to cause a case of shingles.  You cannot catch shingles from another person with shingles. However, a person who has never had chickenpox (or chickenpox vaccine) could get chickenpox from someone with shingles. This is not very common.  Shingles is far more common in people  72 and older than in younger people. It is also more common in people whose immune systems are weakened because of a disease such as cancer or drugs such as steroids or chemotherapy.  At least 1 million people get shingles per year in the [place]. SHINGLES VACCINE  A vaccine for shingles was licensed in 123456. In clinical trials, the vaccine reduced the risk of shingles by 50%. It  can also reduce the pain in people who still get shingles after being vaccinated.  A single dose of shingles vaccine is recommended for adults 83 years of age and older. SOME PEOPLE SHOULD NOT GET SHINGLES VACCINE OR SHOULD WAIT A person should not get shingles vaccine if he or she:  Has ever had a life-threatening allergic reaction to gelatin, the antibiotic neomycin, or any other component of shingles vaccine. Tell your caregiver if you have any severe allergies.  Has a weakened immune system because of current:  AIDS or another disease that affects the immune system.  Treatment with drugs that affect the immune system, such as prolonged use of high-dose steroids.  Cancer treatment, such as radiation or chemotherapy.  Cancer affecting the bone marrow or lymphatic system, such as leukemia or lymphoma.  Is pregnant, or might be pregnant. Women should not become pregnant until at least 4 weeks after getting shingles vaccine. Someone with a minor illness, such as a cold, may be vaccinated. Anyone with a moderate or severe acute illness should usually wait until he or she recovers before getting the vaccine. This includes anyone with a temperature of 101.3 F (38 C) or higher. WHAT ARE THE RISKS FROM SHINGLES VACCINE?  A vaccine, like any medicine, could possibly cause serious problems, such as severe allergic reactions. However, the risk of a vaccine causing serious harm, or death, is extremely small.  No serious problems have been identified with shingles vaccine. Mild Problems  Redness, soreness, swelling, or itching at the site of the injection (about 1 person in 3).  Headache (about 1 person in 33). Like all vaccines, shingles vaccine is being closely monitored for unusual or severe problems. WHAT IF THERE IS A MODERATE OR SEVERE REACTION? What should I look for? Any unusual condition, such as a severe allergic reaction or a high fever. If a severe allergic reaction occurred, it  would be within a few minutes to an hour after the shot. Signs of a serious allergic reaction can include difficulty breathing, weakness, hoarseness or wheezing, a fast heartbeat, hives, dizziness, paleness, or swelling of the throat. What should I do?  Call your caregiver, or get the person to a caregiver right away.  Tell the caregiver what happened, the date and time it happened, and when the vaccination was given.  Ask the caregiver to report the reaction by filing a Vaccine Adverse Event Reporting System (VAERS) form. Or, you can file this report through the VAERS web site at www.vaers.SamedayNews.es or by calling 3375458895. VAERS does not provide medical advice. HOW CAN I LEARN MORE?  Ask your caregiver. He or she can give you the vaccine package insert or suggest other sources of information.  Contact the Centers for Disease Control and Prevention (CDC):  Call 516-173-9219 (1-800-CDC-INFO).  Visit the CDC website at http://hunter.com/ CDC Shingles Vaccine VIS (12/07/07)   This information is not intended to replace advice given to you by your health care provider. Make sure you discuss any questions you have with your health care provider.   Document Released: 12/15/2005 Document Revised:  07/04/2014 Document Reviewed: 06/09/2012 Elsevier Interactive Patient Education 2016 Elsevier Inc. Influenza Virus Vaccine (Flucelvax) What is this medicine? INFLUENZA VIRUS VACCINE (in floo EN zuh VAHY ruhs vak SEEN) helps to reduce the risk of getting influenza also known as the flu. The vaccine only helps protect you against some strains of the flu. This medicine may be used for other purposes; ask your health care provider or pharmacist if you have questions. What should I tell my health care provider before I take this medicine? They need to know if you have any of these conditions: -bleeding disorder like hemophilia -fever or infection -Guillain-Barre syndrome or other neurological  problems -immune system problems -infection with the human immunodeficiency virus (HIV) or AIDS -low blood platelet counts -multiple sclerosis -an unusual or allergic reaction to influenza virus vaccine, other medicines, foods, dyes or preservatives -pregnant or trying to get pregnant -breast-feeding How should I use this medicine? This vaccine is for injection into a muscle. It is given by a health care professional. A copy of Vaccine Information Statements will be given before each vaccination. Read this sheet carefully each time. The sheet may change frequently. Talk to your pediatrician regarding the use of this medicine in children. Special care may be needed. Overdosage: If you think you've taken too much of this medicine contact a poison control center or emergency room at once. Overdosage: If you think you have taken too much of this medicine contact a poison control center or emergency room at once. NOTE: This medicine is only for you. Do not share this medicine with others. What if I miss a dose? This does not apply. What may interact with this medicine? -chemotherapy or radiation therapy -medicines that lower your immune system like etanercept, anakinra, infliximab, and adalimumab -medicines that treat or prevent blood clots like warfarin -phenytoin -steroid medicines like prednisone or cortisone -theophylline -vaccines This list may not describe all possible interactions. Give your health care provider a list of all the medicines, herbs, non-prescription drugs, or dietary supplements you use. Also tell them if you smoke, drink alcohol, or use illegal drugs. Some items may interact with your medicine. What should I watch for while using this medicine? Report any side effects that do not go away within 3 days to your doctor or health care professional. Call your health care provider if any unusual symptoms occur within 6 weeks of receiving this vaccine. You may still catch the  flu, but the illness is not usually as bad. You cannot get the flu from the vaccine. The vaccine will not protect against colds or other illnesses that may cause fever. The vaccine is needed every year. What side effects may I notice from receiving this medicine? Side effects that you should report to your doctor or health care professional as soon as possible: -allergic reactions like skin rash, itching or hives, swelling of the face, lips, or tongue Side effects that usually do not require medical attention (Report these to your doctor or health care professional if they continue or are bothersome.): -fever -headache -muscle aches and pains -pain, tenderness, redness, or swelling at the injection site -tiredness This list may not describe all possible side effects. Call your doctor for medical advice about side effects. You may report side effects to FDA at 1-800-FDA-1088. Where should I keep my medicine? The vaccine will be given by a health care professional in a clinic, pharmacy, doctor's office, or other health care setting. You will not be given vaccine doses to store  at home. NOTE: This sheet is a summary. It may not cover all possible information. If you have questions about this medicine, talk to your doctor, pharmacist, or health care provider.    2016, Elsevier/Gold Standard. (2011-01-29 14:06:47) Vitamin D Deficiency Vitamin D deficiency is when your body does not have enough vitamin D. Vitamin D is important to your body for many reasons:  It helps the body to absorb two important minerals, called calcium and phosphorus.  It plays a role in bone health.  It may help to prevent some diseases, such as diabetes and multiple sclerosis.  It plays a role in muscle function, including heart function. You can get vitamin D by:  Eating foods that naturally contain vitamin D.  Eating or drinking milk or other dairy products that have vitamin D added to them.  Taking a vitamin D  supplement or a multivitamin supplement that contains vitamin D.  Being in the sun. Your body naturally makes vitamin D when your skin is exposed to sunlight. Your body changes the sunlight into a form of the vitamin that the body can use. If vitamin D deficiency is severe, it can cause a condition in which your bones become soft. In adults, this condition is called osteomalacia. In children, this condition is called rickets. CAUSES Vitamin D deficiency may be caused by:  Not eating enough foods that contain vitamin D.  Not getting enough sun exposure.  Having certain digestive system diseases that make it difficult for your body to absorb vitamin D. These diseases include Crohn disease, chronic pancreatitis, and cystic fibrosis.  Having a surgery in which a part of the stomach or a part of the small intestine is removed.  Being obese.  Having chronic kidney disease or liver disease. RISK FACTORS This condition is more likely to develop in:  Older people.  People who do not spend much time outdoors.  People who live in a long-term care facility.  People who have had broken bones.  People with weak or thin bones (osteoporosis).  People who have a disease or condition that changes how the body absorbs vitamin D.  People who have dark skin.  People who take certain medicines, such as steroid medicines or certain seizure medicines.  People who are overweight or obese. SYMPTOMS In mild cases of vitamin D deficiency, there may not be any symptoms. If the condition is severe, symptoms may include:  Bone pain.  Muscle pain.  Falling often.  Broken bones caused by a minor injury. DIAGNOSIS This condition is usually diagnosed with a blood test.  TREATMENT Treatment for this condition may depend on what caused the condition. Treatment options include:  Taking vitamin D supplements.  Taking a calcium supplement. Your health care provider will suggest what dose is best for  you. HOME CARE INSTRUCTIONS  Take medicines and supplements only as told by your health care provider.  Eat foods that contain vitamin D. Choices include:  Fortified dairy products, cereals, or juices. Fortified means that vitamin D has been added to the food. Check the label on the package to be sure.  Fatty fish, such as salmon or trout.  Eggs.  Oysters.  Do not use a tanning bed.  Maintain a healthy weight. Lose weight, if needed.  Keep all follow-up visits as told by your health care provider. This is important. SEEK MEDICAL CARE IF:  Your symptoms do not go away.  You feel like throwing up (nausea) or you throw up (vomit).  You  have fewer bowel movements than usual or it is difficult for you to have a bowel movement (constipation).   This information is not intended to replace advice given to you by your health care provider. Make sure you discuss any questions you have with your health care provider.   Document Released: 05/12/2011 Document Revised: 11/08/2014 Document Reviewed: 07/05/2014 Elsevier Interactive Patient Education 2016 [place] tablets What is this medicine? PHENAZOPYRIDINE (fen az oh PEER i deen) is a pain reliever. It is used to stop the pain, burning, or discomfort caused by infection or irritation of the urinary tract. This medicine is not an antibiotic. It will not cure a urinary tract infection. This medicine may be used for other purposes; ask your health care provider or pharmacist if you have questions. What should I tell my health care provider before I take this medicine? They need to know if you have any of these conditions: -glucose-6-phosphate dehydrogenase (G6PD) deficiency -kidney disease -an unusual or allergic reaction to phenazopyridine, other medicines, foods, dyes, or preservatives -pregnant or trying to get pregnant -breast-feeding How should I use this medicine? Take this medicine by mouth with a glass of  water. Follow the directions on the prescription label. Take after meals. Take your doses at regular intervals. Do not take your medicine more often than directed. Do not skip doses or stop your medicine early even if you feel better. Do not stop taking except on your doctor's advice. Talk to your pediatrician regarding the use of this medicine in children. Special care may be needed. Overdosage: If you think you have taken too much of this medicine contact a poison control center or emergency room at once. NOTE: This medicine is only for you. Do not share this medicine with others. What if I miss a dose? If you miss a dose, take it as soon as you can. If it is almost time for your next dose, take only that dose. Do not take double or extra doses. What may interact with this medicine? Interactions are not expected. This list may not describe all possible interactions. Give your health care provider a list of all the medicines, herbs, non-prescription drugs, or dietary supplements you use. Also tell them if you smoke, drink alcohol, or use illegal drugs. Some items may interact with your medicine. What should I watch for while using this medicine? Tell your doctor or health care professional if your symptoms do not improve or if they get worse. This medicine colors body fluids red. This effect is harmless and will go away after you are done taking the medicine. It will change urine to an dark orange or red color. The red color may stain clothing. Soft contact lenses may become permanently stained. It is best not to wear soft contact lenses while taking this medicine. If you are diabetic you may get a false positive result for sugar in your urine. Talk to your health care provider. What side effects may I notice from receiving this medicine? Side effects that you should report to your doctor or health care professional as soon as possible: -allergic reactions like skin rash, itching or hives, swelling  of the face, lips, or tongue -blue or purple color of the skin -difficulty breathing -fever -less urine -unusual bleeding, bruising -unusual tired, weak -vomiting -yellowing of the eyes or skin Side effects that usually do not require medical attention (report to your doctor or health care professional if they continue or are bothersome): -dark urine -headache -stomach  upset This list may not describe all possible side effects. Call your doctor for medical advice about side effects. You may report side effects to FDA at 1-800-FDA-1088. Where should I keep my medicine? Keep out of the reach of children. Store at room temperature between 15 and 30 degrees C (59 and 86 degrees F). Protect from light and moisture. Throw away any unused medicine after the expiration date. NOTE: This sheet is a summary. It may not cover all possible information. If you have questions about this medicine, talk to your doctor, pharmacist, or health care provider.    2016, Elsevier/Gold Standard. (2007-09-16 11:04:07) Ciprofloxacin tablets What is this medicine? CIPROFLOXACIN (sip roe FLOX a sin) is a quinolone antibiotic. It is used to treat certain kinds of bacterial infections. It will not work for colds, flu, or other viral infections. This medicine may be used for other purposes; ask your health care provider or pharmacist if you have questions. What should I tell my health care provider before I take this medicine? They need to know if you have any of these conditions: -bone problems -cerebral disease -history of low levels of potassium in the blood -joint problems -irregular heartbeat -kidney disease -myasthenia gravis -seizures -tendon problems -tingling of the fingers or toes, or other nerve disorder -an unusual or allergic reaction to ciprofloxacin, other antibiotics or medicines, foods, dyes, or preservatives -pregnant or trying to get pregnant -breast-feeding How should I use this  medicine? Take this medicine by mouth with a glass of water. Follow the directions on the prescription label. Take your medicine at regular intervals. Do not take your medicine more often than directed. Take all of your medicine as directed even if you think your are better. Do not skip doses or stop your medicine early. You can take this medicine with food or on an empty stomach. It can be taken with a meal that contains dairy or calcium, but do not take it alone with a dairy product, like milk or yogurt or calcium-fortified juice. A special MedGuide will be given to you by the pharmacist with each prescription and refill. Be sure to read this information carefully each time. Talk to your pediatrician regarding the use of this medicine in children. Special care may be needed. Overdosage: If you think you have taken too much of this medicine contact a poison control center or emergency room at once. NOTE: This medicine is only for you. Do not share this medicine with others. What if I miss a dose? If you miss a dose, take it as soon as you can. If it is almost time for your next dose, take only that dose. Do not take double or extra doses. What may interact with this medicine? Do not take this medicine with any of the following medications: -cisapride -droperidol -terfenadine -tizanidine This medicine may also interact with the following medications: -antacids -birth control pills -caffeine -cyclosporin -didanosine (ddI) buffered tablets or powder -medicines for diabetes -medicines for inflammation like ibuprofen, naproxen -methotrexate -multivitamins -omeprazole -phenytoin -probenecid -sucralfate -theophylline -warfarin This list may not describe all possible interactions. Give your health care provider a list of all the medicines, herbs, non-prescription drugs, or dietary supplements you use. Also tell them if you smoke, drink alcohol, or use illegal drugs. Some items may interact  with your medicine. What should I watch for while using this medicine? Tell your doctor or health care professional if your symptoms do not improve. Do not treat diarrhea with over the counter products. Contact  your doctor if you have diarrhea that lasts more than 2 days or if it is severe and watery. You may get drowsy or dizzy. Do not drive, use machinery, or do anything that needs mental alertness until you know how this medicine affects you. Do not stand or sit up quickly, especially if you are an older patient. This reduces the risk of dizzy or fainting spells. This medicine can make you more sensitive to the sun. Keep out of the sun. If you cannot avoid being in the sun, wear protective clothing and use sunscreen. Do not use sun lamps or tanning beds/booths. Avoid antacids, aluminum, calcium, iron, magnesium, and zinc products for 6 hours before and 2 hours after taking a dose of this medicine. What side effects may I notice from receiving this medicine? Side effects that you should report to your doctor or health care professional as soon as possible: -allergic reactions like skin rash or hives, swelling of the face, lips, or tongue -anxious -confusion -depressed mood -diarrhea -fast, irregular heartbeat -hallucination, loss of contact with reality -joint, muscle, or tendon pain or swelling -pain, tingling, numbness in the hands or feet -suicidal thoughts or other mood changes -sunburn -unusually weak or tired Side effects that usually do not require medical attention (report to your doctor or health care professional if they continue or are bothersome): -dry mouth -headache -nausea -trouble sleeping This list may not describe all possible side effects. Call your doctor for medical advice about side effects. You may report side effects to FDA at 1-800-FDA-1088. Where should I keep my medicine? Keep out of the reach of children. Store at room temperature below 30 degrees C (86  degrees F). Keep container tightly closed. Throw away any unused medicine after the expiration date. NOTE: This sheet is a summary. It may not cover all possible information. If you have questions about this medicine, talk to your doctor, pharmacist, or health care provider.    2016, Elsevier/Gold Standard. (2014-09-28 12:57:02) Urinary Tract Infection Urinary tract infections (UTIs) can develop anywhere along your urinary tract. Your urinary tract is your body's drainage system for removing wastes and extra water. Your urinary tract includes two kidneys, two ureters, a bladder, and a urethra. Your kidneys are a pair of bean-shaped organs. Each kidney is about the size of your fist. They are located below your ribs, one on each side of your spine. CAUSES Infections are caused by microbes, which are microscopic organisms, including fungi, viruses, and bacteria. These organisms are so small that they can only be seen through a microscope. Bacteria are the microbes that most commonly cause UTIs. SYMPTOMS  Symptoms of UTIs may vary by age and gender of the patient and by the location of the infection. Symptoms in young women typically include a frequent and intense urge to urinate and a painful, burning feeling in the bladder or urethra during urination. Older women and men are more likely to be tired, shaky, and weak and have muscle aches and abdominal pain. A fever may mean the infection is in your kidneys. Other symptoms of a kidney infection include pain in your back or sides below the ribs, nausea, and vomiting. DIAGNOSIS To diagnose a UTI, your caregiver will ask you about your symptoms. Your caregiver will also ask you to provide a urine sample. The urine sample will be tested for bacteria and white blood cells. White blood cells are made by your body to help fight infection. TREATMENT  Typically, UTIs can be treated with  medication. Because most UTIs are caused by a bacterial infection, they  usually can be treated with the use of antibiotics. The choice of antibiotic and length of treatment depend on your symptoms and the type of bacteria causing your infection. HOME CARE INSTRUCTIONS  If you were prescribed antibiotics, take them exactly as your caregiver instructs you. Finish the medication even if you feel better after you have only taken some of the medication.  Drink enough water and fluids to keep your urine clear or pale yellow.  Avoid caffeine, tea, and carbonated beverages. They tend to irritate your bladder.  Empty your bladder often. Avoid holding urine for long periods of time.  Empty your bladder before and after sexual intercourse.  After a bowel movement, women should cleanse from front to back. Use each tissue only once. SEEK MEDICAL CARE IF:   You have back pain.  You develop a fever.  Your symptoms do not begin to resolve within 3 days. SEEK IMMEDIATE MEDICAL CARE IF:   You have severe back pain or lower abdominal pain.  You develop chills.  You have nausea or vomiting.  You have continued burning or discomfort with urination. MAKE SURE YOU:   Understand these instructions.  Will watch your condition.  Will get help right away if you are not doing well or get worse.   This information is not intended to replace advice given to you by your health care provider. Make sure you discuss any questions you have with your health care provider.   Document Released: 11/27/2004 Document Revised: 11/08/2014 Document Reviewed: 03/28/2011 Elsevier Interactive Patient Education [employer].

## 2015-11-13 ENCOUNTER — Other Ambulatory Visit: Payer: Commercial Managed Care - HMO

## 2015-11-13 ENCOUNTER — Ambulatory Visit: Payer: 59 | Admitting: Psychiatry

## 2015-11-13 ENCOUNTER — Other Ambulatory Visit: Payer: Self-pay

## 2015-11-13 LAB — URINE CULTURE: Colony Count: >=100000

## 2015-11-14 ENCOUNTER — Other Ambulatory Visit: Payer: Commercial Managed Care - HMO

## 2015-11-14 ENCOUNTER — Encounter (HOSPITAL_COMMUNITY)
Admission: RE | Disposition: A | Discharge status: Home / Self Care | Payer: Self-pay | Source: Ambulatory Visit | Attending: Internal Medicine

## 2015-11-14 ENCOUNTER — Ambulatory Visit (HOSPITAL_COMMUNITY)
Admission: RE | Admit: 2015-11-14 | Discharge: 2015-11-14 | Disposition: A | Discharge status: Home / Self Care | Payer: Commercial Managed Care - HMO | Source: Ambulatory Visit | Attending: Internal Medicine | Admitting: Internal Medicine

## 2015-11-14 DIAGNOSIS — K6289 Other specified diseases of anus and rectum: Secondary | ICD-10-CM | POA: Diagnosis not present

## 2015-11-14 DIAGNOSIS — K5900 Constipation, unspecified: Secondary | ICD-10-CM | POA: Insufficient documentation

## 2015-11-14 DIAGNOSIS — K5902 Outlet dysfunction constipation: Secondary | ICD-10-CM

## 2015-11-14 HISTORY — PX: ANAL RECTAL MANOMETRY: SHX6358

## 2015-11-14 SURGERY — MANOMETRY, ANORECTAL

## 2015-11-14 NOTE — Progress Notes (Signed)
Anal Rectal Manometry done per protocol. Pt tolerated well without complications. Report to be sent to Dr. Celesta Aver office.

## 2015-11-15 ENCOUNTER — Encounter (HOSPITAL_COMMUNITY): Payer: Self-pay | Admitting: Internal Medicine

## 2015-11-15 DIAGNOSIS — K5902 Outlet dysfunction constipation: Secondary | ICD-10-CM

## 2015-11-15 LAB — VITAMIN D 25 HYDROXY (VIT D DEFICIENCY, FRACTURES): VIT D 25 HYDROXY: 44 ng/mL (ref 30–100)

## 2015-11-15 NOTE — Op Note (Signed)
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

## 2015-11-21 NOTE — Progress Notes (Signed)
Has weak anal sphincter and weak rectal muscles and abnormal sensation Please refer to [facility] Counts PT if patient is willing Think she could be helped by PT and biofeedback and may help urination sxs also

## 2015-11-23 ENCOUNTER — Other Ambulatory Visit: Payer: Self-pay

## 2015-11-23 ENCOUNTER — Telehealth: Payer: Self-pay | Admitting: Internal Medicine

## 2015-11-23 DIAGNOSIS — M6289 Other specified disorders of muscle: Secondary | ICD-10-CM

## 2015-11-23 NOTE — Telephone Encounter (Signed)
See results note for details

## 2015-12-06 ENCOUNTER — Ambulatory Visit (INDEPENDENT_AMBULATORY_CARE_PROVIDER_SITE_OTHER): Payer: 59 | Admitting: Psychiatry

## 2015-12-06 DIAGNOSIS — F4323 Adjustment disorder with mixed anxiety and depressed mood: Secondary | ICD-10-CM

## 2015-12-07 ENCOUNTER — Other Ambulatory Visit (INDEPENDENT_AMBULATORY_CARE_PROVIDER_SITE_OTHER): Payer: Commercial Managed Care - HMO

## 2015-12-07 ENCOUNTER — Ambulatory Visit (INDEPENDENT_AMBULATORY_CARE_PROVIDER_SITE_OTHER): Payer: Commercial Managed Care - HMO | Admitting: Internal Medicine

## 2015-12-07 ENCOUNTER — Encounter: Payer: Self-pay | Admitting: Internal Medicine

## 2015-12-07 VITALS — BP 104/62 | HR 70 | Temp 98.1°F | Resp 16 | Ht 69.0 in | Wt 126.0 lb

## 2015-12-07 DIAGNOSIS — Z833 Family history of diabetes mellitus: Secondary | ICD-10-CM | POA: Diagnosis not present

## 2015-12-07 DIAGNOSIS — R300 Dysuria: Secondary | ICD-10-CM

## 2015-12-07 DIAGNOSIS — Z0000 Encounter for general adult medical examination without abnormal findings: Secondary | ICD-10-CM

## 2015-12-07 DIAGNOSIS — Z23 Encounter for immunization: Secondary | ICD-10-CM

## 2015-12-07 DIAGNOSIS — F418 Other specified anxiety disorders: Secondary | ICD-10-CM

## 2015-12-07 DIAGNOSIS — M8580 Other specified disorders of bone density and structure, unspecified site: Secondary | ICD-10-CM

## 2015-12-07 LAB — CBC WITH DIFFERENTIAL/PLATELET
BASOS ABS: 0 10*3/uL (ref 0.0–0.1)
Basophils Relative: 0.5 % (ref 0.0–3.0)
EOS ABS: 0.1 10*3/uL (ref 0.0–0.7)
Eosinophils Relative: 0.8 % (ref 0.0–5.0)
HEMATOCRIT: 41.1 % (ref 36.0–46.0)
HEMOGLOBIN: 13.7 g/dL (ref 12.0–15.0)
LYMPHS PCT: 26.7 % (ref 12.0–46.0)
Lymphs Abs: 1.9 10*3/uL (ref 0.7–4.0)
MCHC: 33.4 g/dL (ref 30.0–36.0)
MCV: 88.5 fl (ref 78.0–100.0)
Monocytes Absolute: 0.5 10*3/uL (ref 0.1–1.0)
Monocytes Relative: 6.6 % (ref 3.0–12.0)
Neutro Abs: 4.7 10*3/uL (ref 1.4–7.7)
Neutrophils Relative %: 65.4 % (ref 43.0–77.0)
Platelets: 249 10*3/uL (ref 150.0–400.0)
RBC: 4.64 Mil/uL (ref 3.87–5.11)
RDW: 14.7 % (ref 11.5–15.5)
WBC: 7.2 10*3/uL (ref 4.0–10.5)

## 2015-12-07 LAB — LIPID PANEL
CHOLESTEROL: 195 mg/dL (ref 0–200)
HDL: 63.6 mg/dL (ref >39.00)
LDL CALC: 118 mg/dL — AB (ref 0–99)
NonHDL: 131.57
Total CHOL/HDL Ratio: 3
Triglycerides: 70 mg/dL (ref 0.0–149.0)
VLDL: 14 mg/dL (ref 0.0–40.0)

## 2015-12-07 LAB — COMPREHENSIVE METABOLIC PANEL
ALBUMIN: 4.2 g/dL (ref 3.5–5.2)
ALK PHOS: 61 U/L (ref 39–117)
ALT: 11 U/L (ref 0–35)
AST: 14 U/L (ref 0–37)
BUN: 10 mg/dL (ref 6–23)
CHLORIDE: 103 meq/L (ref 96–112)
CO2: 30 mEq/L (ref 19–32)
CREATININE: 0.7 mg/dL (ref 0.40–1.20)
Calcium: 9.6 mg/dL (ref 8.4–10.5)
GFR: 87.48 mL/min (ref >60.00)
Glucose, Bld: 97 mg/dL (ref 70–99)
Potassium: 3.9 mEq/L (ref 3.5–5.1)
SODIUM: 139 meq/L (ref 135–145)
TOTAL PROTEIN: 6.8 g/dL (ref 6.0–8.3)
Total Bilirubin: 0.5 mg/dL (ref 0.2–1.2)

## 2015-12-07 LAB — URINALYSIS, ROUTINE W REFLEX MICROSCOPIC
BILIRUBIN URINE: NEGATIVE
KETONES UR: NEGATIVE
NITRITE: NEGATIVE
Specific Gravity, Urine: 1.02 (ref 1.000–1.030)
Total Protein, Urine: NEGATIVE
Urine Glucose: NEGATIVE
Urobilinogen, UA: 0.2 (ref 0.0–1.0)
pH: 6 (ref 5.0–8.0)

## 2015-12-07 LAB — TSH: TSH: 3.12 u[IU]/mL (ref 0.35–4.50)

## 2015-12-07 LAB — HEMOGLOBIN A1C: Hgb A1c MFr Bld: 5.9 % (ref 4.6–6.5)

## 2015-12-07 NOTE — Progress Notes (Signed)
Pre visit review using our clinic review tool, if applicable. No additional management support is needed unless otherwise documented below in the visit note. 

## 2015-12-07 NOTE — Patient Instructions (Addendum)
Calcium citrate 600 mg a day.    Test(s) ordered today. Your results will be released to [place] (or called to you) after review, usually within 72hours after test completion. If any changes need to be made, you will be notified at that same time.  All other Health Maintenance issues reviewed.   All recommended immunizations and age-appropriate screenings are up-to-date or discussed.  Flu vaccine administered today.   Medications reviewed and updated.  No changes recommended at this time.  Your prescription(s) have been submitted to your pharmacy. Please take as directed and contact our office if you believe you are having problem(s) with the medication(s).   Please followup in one year for a physical   Health Maintenance, Female Adopting a healthy lifestyle and getting preventive care can go a long way to promote health and wellness. Talk with your health care provider about what schedule of regular examinations is right for you. This is a good chance for you to check in with your provider about disease prevention and staying healthy. In between checkups, there are plenty of things you can do on your own. Experts have done a lot of research about which lifestyle changes and preventive measures are most likely to keep you healthy. Ask your health care provider for more information. WEIGHT AND DIET  Eat a healthy diet  Be sure to include plenty of vegetables, fruits, low-fat dairy products, and lean protein.  Do not eat a lot of foods high in solid fats, added sugars, or salt.  Get regular exercise. This is one of the most important things you can do for your health.  Most adults should exercise for at least 150 minutes each week. The exercise should increase your heart rate and make you sweat (moderate-intensity exercise).  Most adults should also do strengthening exercises at least twice a week. This is in addition to the moderate-intensity exercise.  Maintain a healthy  weight  Body mass index (BMI) is a measurement that can be used to identify possible weight problems. It estimates body fat based on height and weight. Your health care provider can help determine your BMI and help you achieve or maintain a healthy weight.  For females 68 years of age and older:   A BMI below 18.5 is considered underweight.  A BMI of 18.5 to 24.9 is normal.  A BMI of 25 to 29.9 is considered overweight.  A BMI of 30 and above is considered obese.  Watch levels of cholesterol and blood lipids  You should start having your blood tested for lipids and cholesterol at 72 years of age, then have this test every 5 years.  You may need to have your cholesterol levels checked more often if:  Your lipid or cholesterol levels are high.  You are older than 72 years of age.  You are at high risk for heart disease.  CANCER SCREENING   Lung Cancer  Lung cancer screening is recommended for adults 61-92 years old who are at high risk for lung cancer because of a history of smoking.  A yearly low-dose CT scan of the lungs is recommended for people who:  Currently smoke.  Have quit within the past 15 years.  Have at least a 30-pack-year history of smoking. A pack year is smoking an average of one pack of cigarettes a day for 1 year.  Yearly screening should continue until it has been 15 years since you quit.  Yearly screening should stop if you develop a health problem  that would prevent you from having lung cancer treatment.  Breast Cancer  Practice breast self-awareness. This means understanding how your breasts normally appear and feel.  It also means doing regular breast self-exams. Let your health care provider know about any changes, no matter how small.  If you are in your 20s or 30s, you should have a clinical breast exam (CBE) by a health care provider every 1-3 years as part of a regular health exam.  If you are 41 or older, have a CBE every year. Also  consider having a breast X-ray (mammogram) every year.  If you have a family history of breast cancer, talk to your health care provider about genetic screening.  If you are at high risk for breast cancer, talk to your health care provider about having an MRI and a mammogram every year.  Breast cancer gene (BRCA) assessment is recommended for women who have family members with BRCA-related cancers. BRCA-related cancers include:  Breast.  Ovarian.  Tubal.  Peritoneal cancers.  Results of the assessment will determine the need for genetic counseling and BRCA1 and BRCA2 testing. Cervical Cancer Your health care provider may recommend that you be screened regularly for cancer of the pelvic organs (ovaries, uterus, and vagina). This screening involves a pelvic examination, including checking for microscopic changes to the surface of your cervix (Pap test). You may be encouraged to have this screening done every 3 years, beginning at age 37.  For women ages 52-65, health care providers may recommend pelvic exams and Pap testing every 3 years, or they may recommend the Pap and pelvic exam, combined with testing for human papilloma virus (HPV), every 5 years. Some types of HPV increase your risk of cervical cancer. Testing for HPV may also be done on women of any age with unclear Pap test results.  Other health care providers may not recommend any screening for nonpregnant women who are considered low risk for pelvic cancer and who do not have symptoms. Ask your health care provider if a screening pelvic exam is right for you.  If you have had past treatment for cervical cancer or a condition that could lead to cancer, you need Pap tests and screening for cancer for at least 20 years after your treatment. If Pap tests have been discontinued, your risk factors (such as having a new sexual partner) need to be reassessed to determine if screening should resume. Some women have medical problems that  increase the chance of getting cervical cancer. In these cases, your health care provider may recommend more frequent screening and Pap tests. Colorectal Cancer  This type of cancer can be detected and often prevented.  Routine colorectal cancer screening usually begins at 72 years of age and continues through 72 years of age.  Your health care provider may recommend screening at an earlier age if you have risk factors for colon cancer.  Your health care provider may also recommend using home test kits to check for hidden blood in the stool.  A small camera at the end of a tube can be used to examine your colon directly (sigmoidoscopy or colonoscopy). This is done to check for the earliest forms of colorectal cancer.  Routine screening usually begins at age 69.  Direct examination of the colon should be repeated every 5-10 years through 72 years of age. However, you may need to be screened more often if early forms of precancerous polyps or small growths are found. Skin Cancer  Check your skin  from head to toe regularly.  Tell your health care provider about any new moles or changes in moles, especially if there is a change in a mole's shape or color.  Also tell your health care provider if you have a mole that is larger than the size of a pencil eraser.  Always use sunscreen. Apply sunscreen liberally and repeatedly throughout the day.  Protect yourself by wearing long sleeves, pants, a wide-brimmed hat, and sunglasses whenever you are outside. HEART DISEASE, DIABETES, AND HIGH BLOOD PRESSURE   High blood pressure causes heart disease and increases the risk of stroke. High blood pressure is more likely to develop in:  People who have blood pressure in the high end of the normal range (130-139/85-89 mm Hg).  People who are overweight or obese.  People who are African American.  If you are 79-65 years of age, have your blood pressure checked every 3-5 years. If you are 65 years of  age or older, have your blood pressure checked every year. You should have your blood pressure measured twice--once when you are at a hospital or clinic, and once when you are not at a hospital or clinic. Record the average of the two measurements. To check your blood pressure when you are not at a hospital or clinic, you can use:  An automated blood pressure machine at a pharmacy.  A home blood pressure monitor.  If you are between 14 years and 60 years old, ask your health care provider if you should take aspirin to prevent strokes.  Have regular diabetes screenings. This involves taking a blood sample to check your fasting blood sugar level.  If you are at a normal weight and have a low risk for diabetes, have this test once every three years after 72 years of age.  If you are overweight and have a high risk for diabetes, consider being tested at a younger age or more often. PREVENTING INFECTION  Hepatitis B  If you have a higher risk for hepatitis B, you should be screened for this virus. You are considered at high risk for hepatitis B if:  You were born in a country where hepatitis B is common. Ask your health care provider which countries are considered high risk.  Your parents were born in a high-risk country, and you have not been immunized against hepatitis B (hepatitis B vaccine).  You have HIV or AIDS.  You use needles to inject street drugs.  You live with someone who has hepatitis B.  You have had sex with someone who has hepatitis B.  You get hemodialysis treatment.  You take certain medicines for conditions, including cancer, organ transplantation, and autoimmune conditions. Hepatitis C  Blood testing is recommended for:  Everyone born from [birth year] through 1967.  Anyone with known risk factors for hepatitis C. Sexually transmitted infections (STIs)  You should be screened for sexually transmitted infections (STIs) including gonorrhea and chlamydia if:  You are  sexually active and are younger than 72 years of age.  You are older than 72 years of age and your health care provider tells you that you are at risk for this type of infection.  Your sexual activity has changed since you were last screened and you are at an increased risk for chlamydia or gonorrhea. Ask your health care provider if you are at risk.  If you do not have HIV, but are at risk, it may be recommended that you take a prescription medicine daily to prevent  HIV infection. This is called pre-exposure prophylaxis (PrEP). You are considered at risk if:  You are sexually active and do not regularly use condoms or know the HIV status of your partner(s).  You take drugs by injection.  You are sexually active with a partner who has HIV. Talk with your health care provider about whether you are at high risk of being infected with HIV. If you choose to begin PrEP, you should first be tested for HIV. You should then be tested every 3 months for as long as you are taking PrEP.  PREGNANCY   If you are premenopausal and you may become pregnant, ask your health care provider about preconception counseling.  If you may become pregnant, take 400 to 800 micrograms (mcg) of folic acid every day.  If you want to prevent pregnancy, talk to your health care provider about birth control (contraception). OSTEOPOROSIS AND MENOPAUSE   Osteoporosis is a disease in which the bones lose minerals and strength with aging. This can result in serious bone fractures. Your risk for osteoporosis can be identified using a bone density scan.  If you are 76 years of age or older, or if you are at risk for osteoporosis and fractures, ask your health care provider if you should be screened.  Ask your health care provider whether you should take a calcium or vitamin D supplement to lower your risk for osteoporosis.  Menopause may have certain physical symptoms and risks.  Hormone replacement therapy may reduce some  of these symptoms and risks. Talk to your health care provider about whether hormone replacement therapy is right for you.  HOME CARE INSTRUCTIONS   Schedule regular health, dental, and eye exams.  Stay current with your immunizations.   Do not use any tobacco products including cigarettes, chewing tobacco, or electronic cigarettes.  If you are pregnant, do not drink alcohol.  If you are breastfeeding, limit how much and how often you drink alcohol.  Limit alcohol intake to no more than 1 drink per day for nonpregnant women. One drink equals 12 ounces of beer, 5 ounces of wine, or 1 ounces of hard liquor.  Do not use street drugs.  Do not share needles.  Ask your health care provider for help if you need support or information about quitting drugs.  Tell your health care provider if you often feel depressed.  Tell your health care provider if you have ever been abused or do not feel safe at home.   This information is not intended to replace advice given to you by your health care provider. Make sure you discuss any questions you have with your health care provider.   Document Released: 09/02/2010 Document Revised: 03/10/2014 Document Reviewed: 01/19/2013 Elsevier Interactive Patient Education [employer].

## 2015-12-07 NOTE — Assessment & Plan Note (Signed)
Start calcium 600 mg daily She is taking vitamin D through [facility] up to date Stressed regular exercise

## 2015-12-07 NOTE — Assessment & Plan Note (Addendum)
Uses xanax rarely Seeing a therapist Fairly controlled Continue above

## 2015-12-07 NOTE — Progress Notes (Signed)
Subjective:    Patient ID: [name], female    DOB: June 23, 1945, 70 y.o.   MRN: [id number]  HPI She is here for a physical exam.   She feels well overall and has no major concerns.  She would like to discuss blood work that will be done.     Medications and allergies reviewed with patient and updated if appropriate.  Patient Active Problem List   Diagnosis Date Noted  . Family history of diabetes mellitus (DM) 12/07/2015  . Constipation due to outlet dysfunction   . Diarrhea 08/15/2015  . Obstructive sleep apnea 07/12/2015  . Seasonal and perennial allergic rhinitis 07/12/2015  . Chest pain 04/26/2015  . Anemia 12/05/2014  . Vitamin D deficiency 12/05/2014  . Sinusitis, chronic 05/30/2014    Class: Chronic  . Vaginal atrophy 10/20/2012  . Insomnia 10/20/2012  . Orthostatic hypotension 02/16/2012  . Chronic LLQ pain 05/20/2011  . Cough 02/13/2011  . Palpitations 12/31/2010  . GERD (gastroesophageal reflux disease) 07/25/2010  . Depression with anxiety 07/25/2010  . Lymphocytic colitis 06/14/2010  . Gastroesophageal reflux disease with hiatal hernia   . BACK PAIN, CHRONIC 12/27/2009  . Osteopenia 12/27/2009  . 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 Fatigue 06/25/2009  . DYSPNEA/SHORTNESS OF BREATH 06/25/2009  . CHEST PAIN-PRECORDIAL 12/12/2008  . Hereditary and idiopathic peripheral neuropathy 12/22/2007  . Hyperlipidemia 03/09/2007  . Generalized anxiety disorder 03/09/2007  . MIGRAINE HEADACHE 03/09/2007  . IBS 03/09/2007  . SPONDYLOSIS 03/09/2007  . Myalgia and myositis 03/09/2007  . SCHATZKI'S RING, HX OF 03/09/2007  . HIATAL HERNIA WITH REFLUX 12/30/2006  . GASTRITIS 02/17/2005  . DIVERTICULOSIS OF COLON 01/01/2000    Current Outpatient Prescriptions on File Prior to Visit  Medication Sig Dispense Refill  . ALPRAZolam (XANAX) 0.25 MG tablet Take 1 tablet (0.25 mg total) by mouth daily as needed for anxiety. 30  tablet 0  . Azelastine-Fluticasone (DYMISTA) 137-50 MCG/ACT SUSP Place 2 sprays into both nostrils at bedtime. 1 Bottle 0  . b complex vitamins capsule Take 1 capsule by mouth daily.    . Biotin 1000 MCG tablet Take 1,000 mcg by mouth daily.      . Estradiol (VAGIFEM) 10 MCG TABS vaginal tablet Place 1 tablet (10 mcg total) vaginally 2 (two) times a week. 8 tablet 11  . fluticasone (FLONASE) 50 MCG/ACT nasal spray Place 1 spray into both nostrils 2 (two) times daily.    . Magnesium Oxide 250 MG TABS Take 250 mg by mouth daily.     . Probiotic Product (ALIGN) 4 MG CAPS Take 1 capsule by mouth daily.     . sodium chloride (OCEAN) 0.65 % SOLN nasal spray Place 1 spray into both nostrils as needed for congestion.    . traZODone (DESYREL) 150 MG tablet Take 1-2 tablets by mouth at bedtime 30 tablet 2  . vitamin C (ASCORBIC ACID) 500 MG tablet Take 500 mg by mouth daily.    . [DISCONTINUED] colesevelam (WELCHOL) 625 MG tablet Take 625 mg by mouth 2 (two) times daily with a meal. Take one tablet by mouth twice a day    . [DISCONTINUED] Hyoscyamine-Phenyltoloxamine ([place] NF) [id number] MG CAPS Take one tablet by mouth as needed for abdominal cramping 24 each 0   No current facility-administered medications on file prior to visit.     Past Medical History:  Diagnosis Date  . Anxiety   . Complication of anesthesia   . Depression    generalized  anxiety disorder  . Diverticulosis   . Duodenal diverticulum   . Family history of adverse reaction to anesthesia    Mother and Daughters- N/V  . Fibromyalgia   . Gastroesophageal reflux disease with hiatal hernia   . Hiatal hernia   . History of kidney stones   . Hyperlipidemia   . IBS (irritable bowel syndrome)   . Lymphocytic colitis    Dr Sharlett Iles  . Migraine headache   . Mitral valve prolapse   . Neuropathy ([place])   . Osteopenia    BMD ordered by GYN  . Peripheral neuropathy ([place])    treated as RLS by  Neurology  . PONV (postoperative  nausea and vomiting)   . Restless leg syndrome   . Schatzki's ring    History of  . Sleep apnea    On CPAP, has not been using  . Spondylosis     Past Surgical History:  Procedure Laterality Date  . ANAL RECTAL MANOMETRY N/A 11/14/2015   Procedure: ANO RECTAL MANOMETRY;  Surgeon: Gatha Mayer, MD;  Location: [facility];  Service: Endoscopy;  Laterality: N/A;  . BREAST ENHANCEMENT SURGERY    . CATARACT EXTRACTION Bilateral   . cataract surgery Left    Dr Bing Plume  . CHOLECYSTECTOMY    . COLONOSCOPY    . ESOPHAGEAL DILATION     X 2  . ROTATOR CUFF REPAIR Left   . SINUS ENDO W/FUSION N/A 05/30/2014   Procedure: REVISION  FRONTAL SINUS SURGERY WITH FUSION SCAN;  Surgeon: Jerrell Belfast, MD;  Location: [facility];  Service: ENT;  Laterality: N/A;  . SINUS SURGERY WITH INSTATRAK     x 2  . TUBAL LIGATION    . Vaginal cystectomy     x 2  . VAGINAL HYSTERECTOMY  05/2007   Vaginal repair, Dr Ubaldo Glassing.  Partial  hysterectomy.    Social History   Social History  . Marital status: Married    Spouse name: N/A  . Number of children: 2  . Years of education: 35   Occupational History  . retired [employer]   Social History Main Topics  . Smoking status: Former Smoker    Packs/day: 0.50    Years: 15.00    Types: Cigarettes    Quit date: 03/03/1998  . Smokeless tobacco: Never Used     Comment: smoked 1973- 2006, up to <  1  ppd  . Alcohol use No  . Drug use: No  . Sexual activity: Yes   Other Topics Concern  . None   Social History Narrative   HAS REGULAR EXERCISE   DAILY CAFFEINE: 1-2 CUPS   Patient is right handed.    Family History  Problem Relation Age of Onset  . Hypertension Father 48  . Stroke Father 29  . Heart attack Father      ? in 26s  . Hypertension Mother   . Neuropathy Mother   . Anemia Mother   . Coronary artery disease Brother     Stent placement in 60s  . Diabetes Maternal Uncle   . Heart attack Paternal [place]el [place] , ? age  . Colon cancer Neg Hx    . Seizures Neg Hx     Review of Systems  Constitutional: Positive for fatigue. Negative for appetite change, chills, fever and unexpected weight change.  HENT: Negative for hearing loss.   Eyes: Negative for visual disturbance.  Respiratory: Positive for cough (allergy related?) and shortness  of breath (in she climbs too many stairs). Negative for wheezing.   Cardiovascular: Positive for chest pain (occ heaviness) and palpitations (sometimes). Negative for leg swelling.  Gastrointestinal: Negative for abdominal pain, blood in stool, constipation, diarrhea and nausea.       Occ gerd  Genitourinary: Positive for dysuria (occ) and frequency (chronic). Negative for hematuria.  Skin: Negative for color change and rash.  Neurological: Positive for light-headedness (occ). Negative for dizziness and headaches.  Psychiatric/Behavioral: Negative for dysphoric mood. The patient is nervous/anxious.        Objective:   Vitals:   12/07/15 1314  BP: 104/62  Pulse: 70  Resp: 16  Temp: 98.1 F (36.7 C)   Filed Weights   12/07/15 1314  Weight: 126 lb (57.2 kg)   Body mass index is 18.61 kg/m.   Physical Exam Constitutional: She appears well-developed and well-nourished. No distress.  HENT:  Head: Normocephalic and atraumatic.  Right Ear: External ear normal. Normal ear canal and TM Left Ear: External ear normal.  Normal ear canal and TM Mouth/Throat: Oropharynx is clear and moist.  Eyes: Conjunctivae and EOM are normal.  Neck: Neck supple. No tracheal deviation present. No thyromegaly present.  No carotid bruit  Cardiovascular: Normal rate, regular rhythm and normal heart sounds.   No murmur heard.  No edema. Pulmonary/Chest: Effort normal and breath sounds normal. No respiratory distress. She has no wheezes. She has no rales.  Breast: deferred to Gyn Abdominal: Soft. She exhibits no distension. There is no tenderness.  Lymphadenopathy: She has no cervical adenopathy.  Skin: Skin  is warm and dry. She is not diaphoretic.  Psychiatric: She has a normal mood and affect. Her behavior is normal.         Assessment & Plan:    Physical exam: Screening blood work  ordered Immunizations   Flu vaccine today, discussed tdap and prevnar Colonoscopy  Up to date  Mammogram  Up to date  Gyn  Up to date  Dexa  Up to date  Eye exams  Due - will schedule Exercise - yard work, very active - will try to increase her exercise Weight normal BMI Skin  None but sees derm Substance abuse  none  See Problem List for Assessment and Plan of chronic medical problems.

## 2015-12-09 LAB — URINE CULTURE

## 2015-12-25 ENCOUNTER — Ambulatory Visit (INDEPENDENT_AMBULATORY_CARE_PROVIDER_SITE_OTHER): Payer: Commercial Managed Care - HMO | Admitting: General Practice

## 2015-12-25 DIAGNOSIS — Z23 Encounter for immunization: Secondary | ICD-10-CM | POA: Diagnosis not present

## 2015-12-25 NOTE — Progress Notes (Signed)
Injection given.   Alzina Golda J Australia Droll, MD  

## 2015-12-31 ENCOUNTER — Encounter: Payer: Self-pay | Admitting: Cardiovascular Disease

## 2015-12-31 ENCOUNTER — Ambulatory Visit (INDEPENDENT_AMBULATORY_CARE_PROVIDER_SITE_OTHER): Payer: Commercial Managed Care - HMO | Admitting: Cardiovascular Disease

## 2015-12-31 VITALS — BP 96/66 | HR 60 | Ht 68.5 in | Wt 128.0 lb

## 2015-12-31 DIAGNOSIS — E785 Hyperlipidemia, unspecified: Secondary | ICD-10-CM

## 2015-12-31 DIAGNOSIS — R002 Palpitations: Secondary | ICD-10-CM

## 2015-12-31 DIAGNOSIS — R0789 Other chest pain: Secondary | ICD-10-CM | POA: Diagnosis not present

## 2015-12-31 DIAGNOSIS — I951 Orthostatic hypotension: Secondary | ICD-10-CM | POA: Diagnosis not present

## 2015-12-31 NOTE — Patient Instructions (Addendum)

## 2015-12-31 NOTE — Progress Notes (Signed)
Cardiology Office Note  Date:  12/31/2015   ID:  Asencion Demeester Fisher, [place] [birth year], MRN [id number]  PCP:  [name], MD   Chief Complaint  Patient presents with  . Hyperlipidemia  . Palpitations    HPI:  [name] is a pleasant 70 yo with history of hyperlipidemia,fibromyalgia, extensive GI disease including hiatal hernia, GERD, gastritis, Schatzkes ring s/p dilatations presents for follow-up of her chest heaviness, hypotension, dizziness  On today's visit she reports 5 pound weight loss compared to her prior office visit Continued episodes of dizziness. Reports having Some palpitations and tachycardia Stopped the cholesterol medication on her own, started fiber Stress at home, comes and goes Chronic neuropathy in her lower extremities  CT coronary calcium score of 0 September 2016  EKG on today's visit shows normal sinus rhythm with rate 60 bpm, no significant ST or T-wave changes  Previous records reviewed Previous Holter monitor in 2012 showing rare short runs of SVT, ectopy. Previously on midodrine for hypotension, stopped this as it was not helping   family history of coronary artery disease and her chest pressure. Brother with a pacemaker  chronically low blood pressure, previous symptoms of chronic dizziness and fatigue.  She also reports having obstructive sleep apnea, significant snoring and CPAP. She's not wearing her CPAP   previous episodes of heaviness in her chest. Cardiac catheterization in the past showed no significant disease.  Holter monitor showed short runs of SVT. 48-hour study.  episodes of shortness of breath, 30 day event monitor showing ABCs but no significant SVT.   Previous sinus surgery and reports her breathing is better. Still continues to snore with frequent waking at night  Prior echocardiogram showed mild MR with no other significant abnormalities. Strong family history of coronary artery disease.   cholesterol 132, HDL 68, LDL 54  PMH:   has a past medical history of Anxiety; Complication of anesthesia; Depression; Diverticulosis; Duodenal diverticulum; Family history of adverse reaction to anesthesia; Fibromyalgia; Gastroesophageal reflux disease with hiatal hernia; Hiatal hernia; History of kidney stones; Hyperlipidemia; IBS (irritable bowel syndrome); Lymphocytic colitis; Migraine headache; Mitral valve prolapse; Neuropathy ([place]); Osteopenia; Peripheral neuropathy ([place]); PONV (postoperative nausea and vomiting); Restless leg syndrome; Schatzki's ring; Sleep apnea; and Spondylosis.  PSH:    Past Surgical History:  Procedure Laterality Date  . ANAL RECTAL MANOMETRY N/A 11/14/2015   Procedure: ANO RECTAL MANOMETRY;  Surgeon: Gatha Mayer, MD;  Location: [facility];  Service: Endoscopy;  Laterality: N/A;  . BREAST ENHANCEMENT SURGERY    . CATARACT EXTRACTION Bilateral   . cataract surgery Left    Dr Bing Plume  . CHOLECYSTECTOMY    . COLONOSCOPY    . ESOPHAGEAL DILATION     X 2  . ROTATOR CUFF REPAIR Left   . SINUS ENDO W/FUSION N/A 05/30/2014   Procedure: REVISION  FRONTAL SINUS SURGERY WITH FUSION SCAN;  Surgeon: Jerrell Belfast, MD;  Location: [facility];  Service: ENT;  Laterality: N/A;  . SINUS SURGERY WITH INSTATRAK     x 2  . TUBAL LIGATION    . Vaginal cystectomy     x 2  . VAGINAL HYSTERECTOMY  05/2007   Vaginal repair, Dr Ubaldo Glassing.  Partial  hysterectomy.    Current Outpatient Prescriptions  Medication Sig Dispense Refill  . ALPRAZolam (XANAX) 0.25 MG tablet Take 1 tablet (0.25 mg total) by mouth daily as needed for anxiety. 30 tablet 0  . Azelastine-Fluticasone (DYMISTA) 137-50 MCG/ACT SUSP Place 2 sprays into both nostrils at  bedtime. 1 Bottle 0  . b complex vitamins capsule Take 1 capsule by mouth daily.    . Biotin 1000 MCG tablet Take 1,000 mcg by mouth daily.      . Estradiol (VAGIFEM) 10 MCG TABS vaginal tablet Place 1 tablet (10 mcg total) vaginally 2 (two) times a week.  8 tablet 11  . fluticasone (FLONASE) 50 MCG/ACT nasal spray Place 1 spray into both nostrils 2 (two) times daily.    . Magnesium Oxide 250 MG TABS Take 250 mg by mouth daily.     . Probiotic Product (ALIGN) 4 MG CAPS Take 1 capsule by mouth daily.     . sodium chloride (OCEAN) 0.65 % SOLN nasal spray Place 1 spray into both nostrils as needed for congestion.    . traZODone (DESYREL) 150 MG tablet Take 1-2 tablets by mouth at bedtime 30 tablet 2  . vitamin C (ASCORBIC ACID) 500 MG tablet Take 500 mg by mouth daily.     No current facility-administered medications for this visit.      Allergies:   Adhesive [tape]; Cymbalta [duloxetine hcl]; Lyrica [pregabalin]; Neurontin [gabapentin]; Nitrofuran derivatives; Paxil [paroxetine hcl]; and Prozac [fluoxetine hcl]   Social History:  The patient  reports that she quit smoking about 17 years ago. Her smoking use included Cigarettes. She has a 7.50 pack-year smoking history. She has never used smokeless tobacco. She reports that she does not drink alcohol or use drugs.   Family History:   family history includes Anemia in her mother; Coronary artery disease in her brother; Diabetes in her maternal uncle; Heart attack in her father and paternal uncle; Hypertension in her mother; Hypertension (age of onset: 41) in her father; Neuropathy in her mother; Stroke (age of onset: 31) in her father.    Review of Systems: Review of Systems  Constitutional: Negative.   Respiratory: Negative.   Cardiovascular: Positive for palpitations.  Gastrointestinal: Negative.   Musculoskeletal: Negative.   Neurological: Positive for dizziness.       Neuropathy lower extremities  Psychiatric/Behavioral: Negative.   All other systems reviewed and are negative.    PHYSICAL EXAM: VS:  BP 96/66   Pulse 60   Ht 5' 8.5" (1.74 m)   Wt 128 lb (58.1 kg)   BMI 19.18 kg/m  , BMI Body mass index is 19.18 kg/m. GEN: Well nourished, well developed, in no acute distress   HEENT: normal  Neck: no JVD, carotid bruits, or masses Cardiac: RRR; no murmurs, rubs, or gallops,no edema , frequent ectopy/skipping Respiratory:  clear to auscultation bilaterally, normal work of breathing GI: soft, nontender, nondistended, + BS MS: no deformity or atrophy  Skin: warm and dry, no rash Neuro:  Strength and sensation are intact Psych: euthymic mood, full affect    Recent Labs: 12/07/2015: ALT 11; BUN 10; Creatinine, Ser 0.70; Hemoglobin 13.7; Platelets 249.0; Potassium 3.9; Sodium 139; TSH 3.12    Lipid Panel Lab Results  Component Value Date   CHOL 195 12/07/2015   HDL 63.60 12/07/2015   LDLCALC 118 (H) 12/07/2015   TRIG 70.0 12/07/2015      Wt Readings from Last 3 Encounters:  12/31/15 128 lb (58.1 kg)  12/07/15 126 lb (57.2 kg)  11/09/15 129 lb (58.5 kg)       ASSESSMENT AND PLAN:  Hyperlipidemia, unspecified hyperlipidemia type - Plan: EKG 12-Lead Calcium score of 0 Long discussion concerning her recent lab work She is happy with her decision to stop the statin, stay on fiber  No further testing needed  Palpitations - Plan: EKG 12-Lead Long discussion concerning various treatment options for her ectopy. Ectopy appreciated on physical exam though not seen on EKG Poor candidate for beta blockers, calcium channel blockers given her low blood pressure. Recommended no further medications at this time  Orthostatic hypotension Recommended hydration, standing up slowly She does not want midodrine or Florinef  Other chest pain Atypical symptoms. No further workup needed   Total encounter time more than 25 minutes  Greater than 50% was spent in counseling and coordination of care with the patient   Disposition:   F/U  12 months   Orders Placed This Encounter  Procedures  . EKG 12-Lead     Signed, [name], M.D., Ph.D. 12/31/2015  [place], [place]

## 2016-01-03 ENCOUNTER — Encounter: Payer: Self-pay | Admitting: Internal Medicine

## 2016-01-03 ENCOUNTER — Ambulatory Visit (INDEPENDENT_AMBULATORY_CARE_PROVIDER_SITE_OTHER): Payer: Commercial Managed Care - HMO | Admitting: Internal Medicine

## 2016-01-03 VITALS — BP 108/68 | HR 69 | Ht 68.5 in | Wt 128.4 lb

## 2016-01-03 DIAGNOSIS — R0600 Dyspnea, unspecified: Secondary | ICD-10-CM

## 2016-01-03 DIAGNOSIS — J4520 Mild intermittent asthma, uncomplicated: Secondary | ICD-10-CM | POA: Diagnosis not present

## 2016-01-03 DIAGNOSIS — G4733 Obstructive sleep apnea (adult) (pediatric): Secondary | ICD-10-CM | POA: Diagnosis not present

## 2016-01-03 LAB — NITRIC OXIDE: NITRIC OXIDE: 6

## 2016-01-03 MED ORDER — AZELASTINE-FLUTICASONE 137-50 MCG/ACT NA SUSP: 1.0000 | Freq: Every day | NASAL | 99 refills | Status: DC

## 2016-01-03 NOTE — Assessment & Plan Note (Signed)
I don't think she is describing asthma and I reassured her. She specifically referred to an episode where she woke short of breath and this would be consistent with her known OSA as discussed.

## 2016-01-03 NOTE — Assessment & Plan Note (Signed)
Mild OSA. She told us insurance wouldn't cover mouthpiece. Now says she was concerned from online reading that it might aggravate degenerative disc/joint disease. She wonders if she can get her old CPAP machine refurbished for trial, establishing with a DME close to her home. Plan-she needs to contact her insurance company or explanation of their coverage for DME/CPAP. Certainly okay to establish with a more convenient DME company to look at CPAP options.

## 2016-01-03 NOTE — Progress Notes (Signed)
07/12/2015-70 year old female former smoker referred courtesy of Dr. Toney Rakes for sleep medicine evaluation Complains of difficulty initiating and maintaining sleep, loud snoring, witnessed apnea.  Evaluated many years ago for sleep disturbance with sleep study-report no longer available. She was given CPAP machine then but found it aggravating. Complicating medical problems include GERD Husband tells her she snores. Sleep is also fragmented by frequent nocturia. Admits daytime "tiredness" but does not nap. One cup of coffee in the morning. Trazodone at bedtime. Difficulty initiating sleep-30-40 minutes. Recently added Prozac. She feels she has "no energy" and attributes that to depression. ENT surgery-sinuses. She still has tonsils. Seasonal allergic rhinitis with nasal congestion and drainage inadequately treated with Flonase denies lung disease. Sleeps on a Tempur-Pedic bed. She and husband can operate their respective sides separately and she sleeps with head elevated.  08/31/2015-70 year old female former smoker followed for OSA, difficulty sleeping complicated by frequent nocturia FOLLOW FOR:  Sleep Study results.  discuss Vit D. Unattended Home Sleep Test 08/09/2015-AHI 10.9/hour, desaturation to 78%, body weight 132 pounds She didn't like experience with CPAP when first tried after initial sleep study years ago. We discussed alternatives. She mentions some nocturnal cough that may be associated with mild reflux. I suggested OTC Pepcid and reflux precautions until she can discuss with her PCP.  01/03/2016-70 year old female former smoker followed for OSA, difficulty sleeping, complicated by frequent nocturia, GERD FOLLOWS FOR: Pt. has not started her new CPAP, would like to disucss her concerns with being on it, She does have an old machine, she is unsure of the DME She disliked experience with CPAP in the past and at last visit we referred her to Dr. Ron Parker to consider an oral appliance.  Insurance would not cover an oral appliance so she had agreed to have [place] order CPAP (auto 5-15) as of 10/15/2015. She has concerns about cost and insurance coverage for CPAP and asks about getting her old machine refurbished. She would also like to go to a DME company closer to where she lives. Expresses concern about possible asthma because occasionally she has trouble getting her breath especially lying down at night. Liked Dymista nasal spray for nasal drainage and itching eyes. FENO 01/03/16- 6= WNL, does not indicate allergic inflammation Office Spirometry 01/03/2016-WNL. FVC 3.06/87%, FEV1 2.53/94%, ratio 0.83, FEF 25-75 percent 2.68/126%.  ROS-see HPI   Negative unless "+" Constitutional:    weight loss, night sweats, fevers, chills, fatigue, lassitude. HEENT:    headaches, difficulty swallowing, tooth/dental problems, sore throat,       sneezing, itching, ear ache, nasal congestion, +post nasal drip, snoring CV:    chest pain, orthopnea, PND, swelling in lower extremities, anasarca,                                                        dizziness, palpitations Resp:   +shortness of breath with exertion or at rest.                productive cough,   non-productive cough, coughing up of blood.              change in color of mucus.  wheezing.   Skin:    rash or lesions. GI:  No-   heartburn, indigestion, abdominal pain, nausea, vomiting, diarrhea,  change in bowel habits, loss of appetite GU: dysuria, change in color of urine, no urgency or frequency.   flank pain. MS:   joint pain, stiffness, decreased range of motion, back pain. Neuro-     nothing unusual Psych:  change in mood or affect.  depression or anxiety.   memory loss.  OBJ- Physical Exam General- Alert, Oriented, Affect-appropriate, Distress- none acute, slender Skin- rash-none, lesions- none, excoriation- none Lymphadenopathy- none Head- atraumatic            Eyes- Gross vision intact, PERRLA, conjunctivae  and secretions clear            Ears- Hearing, canals-normal            Nose- , + turbinate edema, no-Septal dev, mucus, polyps, erosion, perforation             Throat- Mallampati II-III , mucosa clear , drainage- none, tonsils- atrophic, + own teeth Neck- flexible , trachea midline, no stridor , thyroid nl, carotid no bruit Chest - symmetrical excursion , unlabored           Heart/CV- RRR , no murmur , no gallop  , no rub, nl s1 s2                           - JVD- none , edema- none, stasis changes- none, varices- none           Lung- clear to P&A, wheeze- none, cough- none , dullness-none, rub- none           Chest wall-  Abd-  Br/ Gen/ Rectal- Not done, not indicated Extrem- cyanosis- none, clubbing, none, atrophy- none, strength- nl Neuro- grossly intact to observation

## 2016-01-03 NOTE — Patient Instructions (Addendum)
Order- [facility]- please help patient establish with DME in [place] area- needs her old CPAP machine refurbished and set for auto 5-15, or a new machine,   Mask of choice, humidifier, supplies, AirView    Dx OSA                 You will want to call customer service at your medical insurance to get answers to your questions about insurance coverage for CPAP (durable medical equipment) for obstructive sleep apnea  Script for Dymista nasal spray  Order- FENO    Dx asthma mild intermittent  Order- office spirometry   Dx asthma mild intermittent

## 2016-01-03 NOTE — Assessment & Plan Note (Signed)
FENO normal 6-makes allergic rhinitis and conjunctivitis less likely. Okay to try OTC allergy eyedrops but cautioned also to explore possibility of dry eye.

## 2016-01-10 ENCOUNTER — Ambulatory Visit: Payer: 59 | Admitting: Psychiatry

## 2016-01-18 ENCOUNTER — Telehealth: Payer: Self-pay | Admitting: Internal Medicine

## 2016-01-18 DIAGNOSIS — G4733 Obstructive sleep apnea (adult) (pediatric): Secondary | ICD-10-CM

## 2016-01-18 NOTE — Telephone Encounter (Signed)
Patient returning call - she can be reached at [phone] -pr

## 2016-01-18 NOTE — Telephone Encounter (Signed)
[phone] pt calling back

## 2016-01-18 NOTE — Telephone Encounter (Signed)
Attempted to call the pt back but was disconnected. Will try back later.

## 2016-01-18 NOTE — Telephone Encounter (Signed)
[email or web address] x1 

## 2016-01-18 NOTE — Telephone Encounter (Signed)
Spoke with pt. She states that she needs her order for a new CPAP machine to be sent to [facility] instead of [facility]. [facility] is not in network. Order has been placed. Nothing further was needed.

## 2016-03-18 ENCOUNTER — Ambulatory Visit: Payer: Commercial Managed Care - HMO | Admitting: Physical Therapy

## 2016-04-01 ENCOUNTER — Encounter: Payer: Self-pay | Admitting: Internal Medicine

## 2016-04-01 ENCOUNTER — Ambulatory Visit (INDEPENDENT_AMBULATORY_CARE_PROVIDER_SITE_OTHER)
Admission: RE | Admit: 2016-04-01 | Discharge: 2016-04-01 | Disposition: A | Discharge status: Home / Self Care | Payer: Commercial Managed Care - HMO | Source: Ambulatory Visit | Attending: Internal Medicine | Admitting: Internal Medicine

## 2016-04-01 ENCOUNTER — Ambulatory Visit (INDEPENDENT_AMBULATORY_CARE_PROVIDER_SITE_OTHER): Payer: Commercial Managed Care - HMO | Admitting: Internal Medicine

## 2016-04-01 VITALS — BP 114/80 | HR 56 | Temp 97.6°F | Resp 16 | Wt 127.0 lb

## 2016-04-01 DIAGNOSIS — R6883 Chills (without fever): Secondary | ICD-10-CM | POA: Insufficient documentation

## 2016-04-01 DIAGNOSIS — R05 Cough: Secondary | ICD-10-CM | POA: Diagnosis not present

## 2016-04-01 DIAGNOSIS — R059 Cough, unspecified: Secondary | ICD-10-CM

## 2016-04-01 DIAGNOSIS — J209 Acute bronchitis, unspecified: Secondary | ICD-10-CM

## 2016-04-01 MED ORDER — HYDROCODONE-HOMATROPINE 5-1.5 MG/5ML PO SYRP: 5.0000 mL | ORAL_SOLUTION | Freq: Three times a day (TID) | ORAL | 0 refills | Status: DC | PRN

## 2016-04-01 MED ORDER — DOXYCYCLINE HYCLATE 100 MG PO TABS: 100.0000 mg | ORAL_TABLET | Freq: Two times a day (BID) | ORAL | 0 refills | Status: DC

## 2016-04-01 NOTE — Assessment & Plan Note (Signed)
Possible bronchitis - cxr today to rule out pneumonia Having cough, chills and several other symptoms Start doxycyline Symptomatic treatment Hycodan cough syrup

## 2016-04-01 NOTE — Progress Notes (Signed)
Pre visit review using our clinic review tool, if applicable. No additional management support is needed unless otherwise documented below in the visit note. 

## 2016-04-01 NOTE — Patient Instructions (Signed)

## 2016-04-01 NOTE — Progress Notes (Signed)
Subjective:    Patient ID: [name], female    DOB: [birth year], 71 y.o.   MRN: [id number]  HPI She is here for an acute visit.   She thinks she had the" flu" two weeks ago.  She was not seen for her symptoms.   She had chills, body pain, nausea, little diarrhea.  She has continued to have cold symptoms.    She has ear pain, sore throat, nasal congestions, sinus pressure and swollen neck glands.  She is coughing and is bring phlegm up.  She has chest tightness, diarrhea, nausea, headaches and lightheadedness.  She has back pain.  She still has chills.   She has had bronchitis several times.    She has been using chloraseptic.  She is drinking plenty of fluids.    Medications and allergies reviewed with patient and updated if appropriate.  Patient Active Problem List   Diagnosis Date Noted  . Family history of diabetes mellitus (DM) 12/07/2015  . Constipation due to outlet dysfunction   . Diarrhea 08/15/2015  . Obstructive sleep apnea 07/12/2015  . Seasonal and perennial allergic rhinitis 07/12/2015  . Chest pain 04/26/2015  . Anemia 12/05/2014  . Vitamin D deficiency 12/05/2014  . Sinusitis, chronic 05/30/2014    Class: Chronic  . Vaginal atrophy 10/20/2012  . Insomnia 10/20/2012  . Orthostatic hypotension 02/16/2012  . Chronic LLQ pain 05/20/2011  . Cough 02/13/2011  . Palpitations 12/31/2010  . GERD (gastroesophageal reflux disease) 07/25/2010  . Depression with anxiety 07/25/2010  . Lymphocytic colitis 06/14/2010  . Gastroesophageal reflux disease with hiatal hernia   . BACK PAIN, CHRONIC 12/27/2009  . Osteopenia 12/27/2009  . 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 Fatigue 06/25/2009  . Dyspnea 06/25/2009  . CHEST PAIN-PRECORDIAL 12/12/2008  . Hereditary and idiopathic peripheral neuropathy 12/22/2007  . Hyperlipidemia 03/09/2007  . Generalized anxiety disorder 03/09/2007  . MIGRAINE HEADACHE 03/09/2007  . IBS  03/09/2007  . SPONDYLOSIS 03/09/2007  . Myalgia and myositis 03/09/2007  . SCHATZKI'S RING, HX OF 03/09/2007  . HIATAL HERNIA WITH REFLUX 12/30/2006  . GASTRITIS 02/17/2005  . DIVERTICULOSIS OF COLON 01/01/2000    Current Outpatient Prescriptions on File Prior to Visit  Medication Sig Dispense Refill  . ALPRAZolam (XANAX) 0.25 MG tablet Take 1 tablet (0.25 mg total) by mouth daily as needed for anxiety. 30 tablet 0  . Azelastine-Fluticasone (DYMISTA) 137-50 MCG/ACT SUSP Place 2 sprays into both nostrils at bedtime. 1 Bottle 0  . Azelastine-Fluticasone (DYMISTA) 137-50 MCG/ACT SUSP Place 1-2 puffs into the nose at bedtime. 1 Bottle prn  . b complex vitamins capsule Take 1 capsule by mouth daily.    . Biotin 1000 MCG tablet Take 1,000 mcg by mouth daily.      . Estradiol (VAGIFEM) 10 MCG TABS vaginal tablet Place 1 tablet (10 mcg total) vaginally 2 (two) times a week. 8 tablet 11  . Magnesium Oxide 250 MG TABS Take 250 mg by mouth daily.     . Probiotic Product (ALIGN) 4 MG CAPS Take 1 capsule by mouth daily.     . sodium chloride (OCEAN) 0.65 % SOLN nasal spray Place 1 spray into both nostrils as needed for congestion.    . traZODone (DESYREL) 150 MG tablet Take 1-2 tablets by mouth at bedtime 30 tablet 2  . vitamin C (ASCORBIC ACID) 500 MG tablet Take 500 mg by mouth daily.    . [DISCONTINUED] colesevelam (WELCHOL) 625 MG tablet Take 625 mg by  mouth 2 (two) times daily with a meal. Take one tablet by mouth twice a day    . [DISCONTINUED] Hyoscyamine-Phenyltoloxamine ([place] NF) [id number] MG CAPS Take one tablet by mouth as needed for abdominal cramping 24 each 0   No current facility-administered medications on file prior to visit.     Past Medical History:  Diagnosis Date  . Anxiety   . Complication of anesthesia   . Depression    generalized anxiety disorder  . Diverticulosis   . Duodenal diverticulum   . Family history of adverse reaction to anesthesia    Mother and Daughters-  N/V  . Fibromyalgia   . Gastroesophageal reflux disease with hiatal hernia   . Hiatal hernia   . History of kidney stones   . Hyperlipidemia   . IBS (irritable bowel syndrome)   . Lymphocytic colitis    Dr Sharlett Iles  . Migraine headache   . Mitral valve prolapse   . Neuropathy ([place])   . Osteopenia    BMD ordered by GYN  . Peripheral neuropathy ([place])    treated as RLS by  Neurology  . PONV (postoperative nausea and vomiting)   . Restless leg syndrome   . Schatzki's ring    History of  . Sleep apnea    On CPAP, has not been using  . Spondylosis     Past Surgical History:  Procedure Laterality Date  . ANAL RECTAL MANOMETRY N/A 11/14/2015   Procedure: ANO RECTAL MANOMETRY;  Surgeon: Gatha Mayer, MD;  Location: [facility];  Service: Endoscopy;  Laterality: N/A;  . BREAST ENHANCEMENT SURGERY    . CATARACT EXTRACTION Bilateral   . cataract surgery Left    Dr Bing Plume  . CHOLECYSTECTOMY    . COLONOSCOPY    . ESOPHAGEAL DILATION     X 2  . ROTATOR CUFF REPAIR Left   . SINUS ENDO W/FUSION N/A 05/30/2014   Procedure: REVISION  FRONTAL SINUS SURGERY WITH FUSION SCAN;  Surgeon: Jerrell Belfast, MD;  Location: [facility];  Service: ENT;  Laterality: N/A;  . SINUS SURGERY WITH INSTATRAK     x 2  . TUBAL LIGATION    . Vaginal cystectomy     x 2  . VAGINAL HYSTERECTOMY  05/2007   Vaginal repair, Dr Ubaldo Glassing.  Partial  hysterectomy.    Social History   Social History  . Marital status: Married    Spouse name: N/A  . Number of children: 2  . Years of education: 70   Occupational History  . retired [employer]   Social History Main Topics  . Smoking status: Former Smoker    Packs/day: 0.50    Years: 15.00    Types: Cigarettes    Quit date: 03/03/1998  . Smokeless tobacco: Never Used     Comment: smoked 1973- 2006, up to <  1  ppd  . Alcohol use No  . Drug use: No  . Sexual activity: Yes   Other Topics Concern  . Not on file   Social History Narrative   HAS REGULAR EXERCISE     DAILY CAFFEINE: 1-2 CUPS   Patient is right handed.    Family History  Problem Relation Age of Onset  . Hypertension Father 29  . Stroke Father 50  . Heart attack Father      ? in 56s  . Hypertension Mother   . Neuropathy Mother   . Anemia Mother   . Coronary artery disease Brother  Stent placement in 4s  . Diabetes Maternal Uncle   . Heart attack Paternal [place]el [place] , ? age  . Colon cancer Neg Hx   . Seizures Neg Hx     Review of Systems  Constitutional: Positive for appetite change (decreased), chills and fatigue. Negative for fever.  HENT: Positive for congestion, ear pain, sinus pain and sore throat.   Respiratory: Positive for cough and chest tightness. Negative for shortness of breath and wheezing.   Gastrointestinal: Positive for diarrhea and nausea.  Musculoskeletal: Positive for back pain and myalgias.  Neurological: Positive for dizziness, light-headedness and headaches.       Objective:   Vitals:   04/01/16 1615  BP: 114/80  Pulse: (!) 56  Resp: 16  Temp: 97.6 F (36.4 C)   Filed Weights   04/01/16 1615  Weight: 127 lb (57.6 kg)   Body mass index is 19.03 kg/m.  Wt Readings from Last 3 Encounters:  04/01/16 127 lb (57.6 kg)  01/03/16 128 lb 6.4 oz (58.2 kg)  12/31/15 128 lb (58.1 kg)     Physical Exam GENERAL APPEARANCE: Appears stated age, mildly ill appearing, NAD EYES: conjunctiva clear, no icterus HEENT: bilateral tympanic membranes and ear canals normal, oropharynx with mild erythema, no thyromegaly, trachea midline, no cervical or supraclavicular lymphadenopathy LUNGS: Clear to auscultation without wheeze or crackles, unlabored breathing, good air entry bilaterally HEART: Normal S1,S2 without murmurs EXTREMITIES: Without clubbing, cyanosis, or edema       Assessment & Plan:   See Problem List for Assessment and Plan of chronic medical problems.

## 2016-04-07 ENCOUNTER — Other Ambulatory Visit: Payer: Self-pay | Admitting: Gynecology

## 2016-04-07 NOTE — Telephone Encounter (Signed)
Called into pharmacy

## 2016-04-10 ENCOUNTER — Telehealth: Payer: Self-pay | Admitting: Emergency Medicine

## 2016-04-10 NOTE — Telephone Encounter (Signed)
Pt called and stated she is still having flu like symptoms along with a few other new symptoms. She wants to know if Dr Quay Burow can call her in something. I let her know that since its been 2 weeks she probably needs to make a FU appt. She made one with Nche tomorrow but doesn't want to come in. She wants you to call her so she can get medication and cancel her appt. Please advise thanks.

## 2016-04-10 NOTE — Telephone Encounter (Signed)
Spoke with Pt to inform. She will keep her appt tomorrow with Baldo Ash.

## 2016-04-10 NOTE — Telephone Encounter (Signed)
There are multiple things going around right now-several different viruses and bacteria. She needs to be evaluated before we know the proper way to treat her.

## 2016-04-11 ENCOUNTER — Encounter: Payer: Self-pay | Admitting: Nurse Practitioner

## 2016-04-11 ENCOUNTER — Ambulatory Visit (INDEPENDENT_AMBULATORY_CARE_PROVIDER_SITE_OTHER): Payer: Commercial Managed Care - HMO | Admitting: Nurse Practitioner

## 2016-04-11 VITALS — BP 120/60 | HR 68 | Temp 97.8°F | Ht 68.5 in | Wt 127.0 lb

## 2016-04-11 DIAGNOSIS — J209 Acute bronchitis, unspecified: Secondary | ICD-10-CM | POA: Diagnosis not present

## 2016-04-11 MED ORDER — PREDNISONE 10 MG (21) PO TBPK: 10.0000 mg | ORAL_TABLET | ORAL | 0 refills | Status: DC

## 2016-04-11 MED ORDER — ALBUTEROL SULFATE HFA 108 (90 BASE) MCG/ACT IN AERS
2.0000 | INHALATION_SPRAY | Freq: Four times a day (QID) | RESPIRATORY_TRACT | 0 refills | Status: DC | PRN
Start: 2016-04-11 — End: 2016-04-14

## 2016-04-11 NOTE — Progress Notes (Signed)
Subjective:  Patient ID: [name], female    DOB: 05-15-1945  Age: 71 y.o. MRN: [id number]  CC: Acute Visit (still not feeling beter from last visit, feeeling weak, having a hard time breathing )   Cough  This is a new problem. The current episode started 1 to 4 weeks ago. The problem has been unchanged. The problem occurs constantly. The cough is productive of sputum. Associated symptoms include ear congestion, nasal congestion, postnasal drip, rhinorrhea, a sore throat, shortness of breath and wheezing. Pertinent negatives include no chest pain, chills, fever, headaches, heartburn, hemoptysis or myalgias. Nothing aggravates the symptoms. She has tried OTC cough suppressant and prescription cough suppressant (nasal spray and doxycycline) for the symptoms. Her past medical history is significant for bronchitis and COPD. There is no history of asthma.  CXR done 04/01/16  Outpatient Medications Prior to Visit  Medication Sig Dispense Refill  . ALPRAZolam (XANAX) 0.25 MG tablet Take 1 tablet (0.25 mg total) by mouth daily as needed for anxiety. 30 tablet 0  . Azelastine-Fluticasone (DYMISTA) 137-50 MCG/ACT SUSP Place 1-2 puffs into the nose at bedtime. 1 Bottle prn  . b complex vitamins capsule Take 1 capsule by mouth daily.    . Biotin 1000 MCG tablet Take 1,000 mcg by mouth daily.      . Estradiol (VAGIFEM) 10 MCG TABS vaginal tablet Place 1 tablet (10 mcg total) vaginally 2 (two) times a week. 8 tablet 11  . HYDROcodone-homatropine (HYCODAN) 5-1.5 MG/5ML syrup Take 5 mLs by mouth every 8 (eight) hours as needed for cough. 120 mL 0  . Magnesium Oxide 250 MG TABS Take 250 mg by mouth daily.     . Probiotic Product (ALIGN) 4 MG CAPS Take 1 capsule by mouth daily.     . sodium chloride (OCEAN) 0.65 % SOLN nasal spray Place 1 spray into both nostrils as needed for congestion.    . traZODone (DESYREL) 150 MG tablet TAKE ONE (1) TO TWO (2) TABLETS BY MOUTHAT BEDTIME 30 tablet 4    . vitamin C (ASCORBIC ACID) 500 MG tablet Take 500 mg by mouth daily.    [name] doxycycline (VIBRA-TABS) 100 MG tablet Take 1 tablet (100 mg total) by mouth 2 (two) times daily. (Patient not taking: Reported on 04/11/2016) 20 tablet 0   No facility-administered medications prior to visit.     ROS See HPI  Objective:  BP 120/60 (BP Location: Left Arm, Patient Position: Sitting, Cuff Size: Normal)   Pulse 68   Temp 97.8 F (36.6 C) (Oral)   Ht 5' 8.5" (1.74 m)   Wt 127 lb (57.6 kg)   SpO2 99%   BMI 19.03 kg/m   BP Readings from Last 3 Encounters:  04/11/16 120/60  04/01/16 114/80  01/03/16 108/68    Wt Readings from Last 3 Encounters:  04/11/16 127 lb (57.6 kg)  04/01/16 127 lb (57.6 kg)  01/03/16 128 lb 6.4 oz (58.2 kg)    Physical Exam  Constitutional: She is oriented to person, place, and time.  HENT:  Right Ear: Tympanic membrane, external ear and ear canal normal.  Left Ear: Tympanic membrane, external ear and ear canal normal.  Nose: Mucosal edema and rhinorrhea present. Right sinus exhibits maxillary sinus tenderness. Right sinus exhibits no frontal sinus tenderness. Left sinus exhibits maxillary sinus tenderness. Left sinus exhibits no frontal sinus tenderness.  Mouth/Throat: Uvula is midline. No trismus in the jaw. Posterior oropharyngeal erythema present. No oropharyngeal exudate.  Eyes: No scleral icterus.  Neck: Normal range of motion. Neck supple.  Cardiovascular: Normal rate and normal heart sounds.   Pulmonary/Chest: Effort normal and breath sounds normal.  Musculoskeletal: She exhibits no edema.  Lymphadenopathy:    She has no cervical adenopathy.  Neurological: She is alert and oriented to person, place, and time.  Vitals reviewed.   Lab Results  Component Value Date   WBC 7.2 12/07/2015   HGB 13.7 12/07/2015   HCT 41.1 12/07/2015   PLT 249.0 12/07/2015   GLUCOSE 97 12/07/2015   CHOL 195 12/07/2015   TRIG 70.0 12/07/2015   HDL 63.60 12/07/2015    LDLCALC 118 (H) 12/07/2015   ALT 11 12/07/2015   AST 14 12/07/2015   NA 139 12/07/2015   K 3.9 12/07/2015   CL 103 12/07/2015   CREATININE 0.70 12/07/2015   BUN 10 12/07/2015   CO2 30 12/07/2015   TSH 3.12 12/07/2015   INR 1.08 01/09/2009   HGBA1C 5.9 12/07/2015   MICROALBUR 2.5 (H) 01/06/2011    Dg Chest 2 View  Result Date: 04/02/2016 CLINICAL DATA:  Cough and congestion. EXAM: CHEST  2 VIEW COMPARISON:  Chest x-ray 05/30/2014 . FINDINGS: Mediastinum and hilar structures normal. COPD. Lungs are clear. No pleural effusion or pneumothorax. Cardiomegaly with normal pulmonary vascularity. Biapical pleural thickening noted consistent with scarring. Degenerative changes thoracic spine. IMPRESSION: 1. Cardiomegaly.  No pulmonary venous congestion. 2.  COPD .  No focal pulmonary infiltrate . Electronically Signed   By: Marcello Moores  Register   On: 04/02/2016 08:49    Assessment & Plan:   [name] was seen today for acute visit.  Diagnoses and all orders for this visit:  Acute bronchitis, unspecified organism -     predniSONE (STERAPRED UNI-PAK 21 TAB) 10 MG (21) TBPK tablet; Take 1 tablet (10 mg total) by mouth as directed. -     albuterol (PROVENTIL HFA;VENTOLIN HFA) 108 (90 Base) MCG/ACT inhaler; Inhale 2 puffs into the lungs every 6 (six) hours as needed for wheezing or shortness of breath.   I have discontinued [name]'s doxycycline. I am also having her start on predniSONE and albuterol. Additionally, I am having her maintain her Magnesium Oxide, ALIGN, Biotin, b complex vitamins, vitamin C, sodium chloride, ALPRAZolam, Estradiol, Azelastine-Fluticasone, HYDROcodone-homatropine, and traZODone.  Meds ordered this encounter  Medications  . predniSONE (STERAPRED UNI-PAK 21 TAB) 10 MG (21) TBPK tablet    Sig: Take 1 tablet (10 mg total) by mouth as directed.    Dispense:  21 tablet    Refill:  0    Order Specific Question:   Supervising Provider    Answer:   [name] [[id number]]  . albuterol (PROVENTIL HFA;VENTOLIN HFA) 108 (90 Base) MCG/ACT inhaler    Sig: Inhale 2 puffs into the lungs every 6 (six) hours as needed for wheezing or shortness of breath.    Dispense:  1 Inhaler    Refill:  0    Order Specific Question:   Supervising Provider    Answer:   [name] [[id number]]    Follow-up: Return if symptoms worsen or fail to improve.  Wilfred Lacy, NP

## 2016-04-11 NOTE — Progress Notes (Signed)
Pre visit review using our clinic review tool, if applicable. No additional management support is needed unless otherwise documented below in the visit note. 

## 2016-04-11 NOTE — Patient Instructions (Signed)

## 2016-04-14 ENCOUNTER — Telehealth: Payer: Self-pay | Admitting: *Deleted

## 2016-04-14 MED ORDER — LEVALBUTEROL TARTRATE 45 MCG/ACT IN AERO: 1.0000 | INHALATION_SPRAY | RESPIRATORY_TRACT | 2 refills | Status: DC | PRN

## 2016-04-14 NOTE — Telephone Encounter (Signed)
Pt left msg on triage stating was rx albuterol inhaler, but she is not able to use. Inhaler is giving her terrible heart burn. Requesting alternative...[name]

## 2016-04-14 NOTE — Telephone Encounter (Signed)
will try xopenex - it may cause the same symptoms - have her call if it does

## 2016-04-15 MED ORDER — BUDESONIDE-FORMOTEROL FUMARATE 80-4.5 MCG/ACT IN AERO: 2.0000 | INHALATION_SPRAY | Freq: Two times a day (BID) | RESPIRATORY_TRACT | 3 refills | Status: DC

## 2016-04-15 NOTE — Telephone Encounter (Signed)
Notified pt MD sent symbicort to pharmacy, also called pharmacy inform them to cancel xopenex rx that was sent this am,.../lmb

## 2016-04-15 NOTE — Telephone Encounter (Signed)
Notified pt w/MD response. Pt states she would rather go back on the symbicort that Dr. Linna Darner had her on . She states she done well with that inhaler...[name]

## 2016-04-15 NOTE — Telephone Encounter (Signed)
symbicort sent

## 2016-04-22 ENCOUNTER — Ambulatory Visit (INDEPENDENT_AMBULATORY_CARE_PROVIDER_SITE_OTHER): Payer: Commercial Managed Care - HMO | Admitting: Internal Medicine

## 2016-04-22 VITALS — BP 100/80 | HR 72 | Temp 98.3°F | Resp 16 | Ht 68.5 in | Wt 126.0 lb

## 2016-04-22 DIAGNOSIS — J418 Mixed simple and mucopurulent chronic bronchitis: Secondary | ICD-10-CM | POA: Diagnosis not present

## 2016-04-22 DIAGNOSIS — R05 Cough: Secondary | ICD-10-CM | POA: Diagnosis not present

## 2016-04-22 DIAGNOSIS — R059 Cough, unspecified: Secondary | ICD-10-CM

## 2016-04-22 DIAGNOSIS — J45909 Unspecified asthma, uncomplicated: Secondary | ICD-10-CM | POA: Insufficient documentation

## 2016-04-22 MED ORDER — HYDROCOD POLST-CPM POLST ER 10-8 MG/5ML PO SUER: 5.0000 mL | Freq: Two times a day (BID) | ORAL | 0 refills | Status: DC | PRN

## 2016-04-22 MED ORDER — GLYCOPYRROLATE-FORMOTEROL 9-4.8 MCG/ACT IN AERO: 2.0000 | INHALATION_SPRAY | Freq: Two times a day (BID) | RESPIRATORY_TRACT | 11 refills | Status: DC

## 2016-04-22 NOTE — Progress Notes (Signed)
Pre visit review using our clinic review tool, if applicable. No additional management support is needed unless otherwise documented below in the visit note. 

## 2016-04-22 NOTE — Progress Notes (Signed)
Subjective:  Patient ID: [name], female    DOB: 05-20-1945  Age: 71 y.o. MRN: [id number]  CC: Cough   HPI [name] presents for concerns about a persistent, nonproductive cough over the last few weeks. She has been treated with prednisone and antibiotics and says she is somewhat better. She was recently given a cough suppressant but says it is not effective and she wants to try a new cough suppressant. The cough keeps her awake at night and sometimes when she coughs she has diffuse chest wall pain. She denies hemoptysis, shortness of breath, pleuritic pain, dizziness, fever, chills, or night sweats. Her recent chest x-ray was remarkable for COPD. She is a previous smoker. She has not gotten much symptom relief from the use of Symbicort.  Outpatient Medications Prior to Visit  Medication Sig Dispense Refill  . ALPRAZolam (XANAX) 0.25 MG tablet Take 1 tablet (0.25 mg total) by mouth daily as needed for anxiety. 30 tablet 0  . b complex vitamins capsule Take 1 capsule by mouth daily.    . Biotin 1000 MCG tablet Take 1,000 mcg by mouth daily.      . Estradiol (VAGIFEM) 10 MCG TABS vaginal tablet Place 1 tablet (10 mcg total) vaginally 2 (two) times a week. 8 tablet 11  . Magnesium Oxide 250 MG TABS Take 250 mg by mouth daily.     . Probiotic Product (ALIGN) 4 MG CAPS Take 1 capsule by mouth daily.     . sodium chloride (OCEAN) 0.65 % SOLN nasal spray Place 1 spray into both nostrils as needed for congestion.    . traZODone (DESYREL) 150 MG tablet TAKE ONE (1) TO TWO (2) TABLETS BY MOUTHAT BEDTIME 30 tablet 4  . vitamin C (ASCORBIC ACID) 500 MG tablet Take 500 mg by mouth daily.    . budesonide-formoterol (SYMBICORT) 80-4.5 MCG/ACT inhaler Inhale 2 puffs into the lungs 2 (two) times daily. 1 Inhaler 3  . HYDROcodone-homatropine (HYCODAN) 5-1.5 MG/5ML syrup Take 5 mLs by mouth every 8 (eight) hours as needed for cough. 120 mL 0  . Azelastine-Fluticasone  (DYMISTA) 137-50 MCG/ACT SUSP Place 1-2 puffs into the nose at bedtime. (Patient not taking: Reported on 04/22/2016) 1 Bottle prn  . predniSONE (STERAPRED UNI-PAK 21 TAB) 10 MG (21) TBPK tablet Take 1 tablet (10 mg total) by mouth as directed. (Patient not taking: Reported on 04/22/2016) 21 tablet 0   No facility-administered medications prior to visit.     ROS Review of Systems  Constitutional: Negative for activity change, chills, diaphoresis, fatigue and fever.  HENT: Negative.  Negative for sinus pressure and trouble swallowing.   Eyes: Negative.   Respiratory: Positive for cough. Negative for chest tightness, shortness of breath, wheezing and stridor.   Cardiovascular: Positive for chest pain. Negative for palpitations and leg swelling.  Gastrointestinal: Negative for abdominal pain, diarrhea, nausea and vomiting.  Endocrine: Negative.   Genitourinary: Negative.  Negative for difficulty urinating.  Musculoskeletal: Negative for back pain, myalgias and neck pain.  Skin: Negative.  Negative for color change and rash.  Allergic/Immunologic: Negative.   Neurological: Negative.  Negative for dizziness and weakness.  Hematological: Negative.  Negative for adenopathy. Does not bruise/bleed easily.  Psychiatric/Behavioral: Negative.     Objective:  BP 100/80 (BP Location: Left Arm, Patient Position: Sitting, Cuff Size: Small)   Pulse 72   Temp 98.3 F (36.8 C) (Oral)   Resp 16   Ht 5' 8.5" (1.74 m)   Wt 126 lb  0.6 oz (57.2 kg)   SpO2 97%   BMI 18.89 kg/m   BP Readings from Last 3 Encounters:  04/22/16 100/80  04/11/16 120/60  04/01/16 114/80    Wt Readings from Last 3 Encounters:  04/22/16 126 lb 0.6 oz (57.2 kg)  04/11/16 127 lb (57.6 kg)  04/01/16 127 lb (57.6 kg)    Physical Exam  Constitutional: She is oriented to person, place, and time.  Non-toxic appearance. She does not have a sickly appearance. She does not appear ill. No distress.  HENT:  Mouth/Throat:  Oropharynx is clear and moist. No oropharyngeal exudate.  Eyes: Conjunctivae are normal. Right eye exhibits no discharge. Left eye exhibits no discharge. No scleral icterus.  Neck: Normal range of motion. Neck supple. No JVD present. No tracheal deviation present. No thyromegaly present.  Cardiovascular: Normal rate, regular rhythm, normal heart sounds and intact distal pulses.  Exam reveals no gallop and no friction rub.   No murmur heard. Pulmonary/Chest: Effort normal and breath sounds normal. No stridor. No respiratory distress. She has no wheezes. She has no rales. She exhibits no tenderness.  Abdominal: Soft. Bowel sounds are normal. She exhibits no distension and no mass. There is no tenderness. There is no rebound and no guarding.  Musculoskeletal: Normal range of motion. She exhibits no edema, tenderness or deformity.  Lymphadenopathy:    She has no cervical adenopathy.  Neurological: She is oriented to person, place, and time.  Skin: Skin is warm and dry. No rash noted. She is not diaphoretic. No erythema. No pallor.  Vitals reviewed.   Lab Results  Component Value Date   WBC 7.2 12/07/2015   HGB 13.7 12/07/2015   HCT 41.1 12/07/2015   PLT 249.0 12/07/2015   GLUCOSE 97 12/07/2015   CHOL 195 12/07/2015   TRIG 70.0 12/07/2015   HDL 63.60 12/07/2015   LDLCALC 118 (H) 12/07/2015   ALT 11 12/07/2015   AST 14 12/07/2015   NA 139 12/07/2015   K 3.9 12/07/2015   CL 103 12/07/2015   CREATININE 0.70 12/07/2015   BUN 10 12/07/2015   CO2 30 12/07/2015   TSH 3.12 12/07/2015   INR 1.08 01/09/2009   HGBA1C 5.9 12/07/2015   MICROALBUR 2.5 (H) 01/06/2011    Dg Chest 2 View  Result Date: 04/02/2016 CLINICAL DATA:  Cough and congestion. EXAM: CHEST  2 VIEW COMPARISON:  Chest x-ray 05/30/2014 . FINDINGS: Mediastinum and hilar structures normal. COPD. Lungs are clear. No pleural effusion or pneumothorax. Cardiomegaly with normal pulmonary vascularity. Biapical pleural thickening noted  consistent with scarring. Degenerative changes thoracic spine. IMPRESSION: 1. Cardiomegaly.  No pulmonary venous congestion. 2.  COPD .  No focal pulmonary infiltrate . Electronically Signed   By: Marcello Moores  Register   On: 04/02/2016 08:49    Assessment & Plan:   Litzzy was seen today for cough.  Diagnoses and all orders for this visit:  Cough- she was not able to complete a FeNO test. Will try to control the cough with Tussionex suspension. -     POCT EXHALED NITRIC OXIDE -     chlorpheniramine-HYDROcodone (TUSSIONEX PENNKINETIC ER) 10-8 MG/5ML SUER; Take 5 mLs by mouth every 12 (twelve) hours as needed for cough.  Mixed simple and mucopurulent chronic bronchitis ([place])- she has not gotten much sx relief with the LABA/ICS combination, will try a LAMA/LABA combo inhaler, she was given a sample of Bevespi was shown how to use it. She demonstrated proficiency with its use -  Glycopyrrolate-Formoterol (BEVESPI AEROSPHERE) 9-4.8 MCG/ACT AERO; Inhale 2 puffs into the lungs 2 (two) times daily.   I have discontinued Ms. [name]'s Azelastine-Fluticasone, HYDROcodone-homatropine, predniSONE, and budesonide-formoterol. I am also having her start on chlorpheniramine-HYDROcodone and Glycopyrrolate-Formoterol. Additionally, I am having her maintain her Magnesium Oxide, ALIGN, Biotin, b complex vitamins, vitamin C, sodium chloride, ALPRAZolam, Estradiol, and traZODone.  Meds ordered this encounter  Medications  . chlorpheniramine-HYDROcodone (TUSSIONEX PENNKINETIC ER) 10-8 MG/5ML SUER    Sig: Take 5 mLs by mouth every 12 (twelve) hours as needed for cough.    Dispense:  140 mL    Refill:  0  . Glycopyrrolate-Formoterol (BEVESPI AEROSPHERE) 9-4.8 MCG/ACT AERO    Sig: Inhale 2 puffs into the lungs 2 (two) times daily.    Dispense:  10.7 g    Refill:  11     Follow-up: Return in about 4 weeks (around 05/20/2016).  Scarlette Calico, MD

## 2016-04-22 NOTE — Patient Instructions (Signed)
Cough, Adult Coughing is a reflex that clears your throat and your airways. Coughing helps to heal and protect your lungs. It is normal to cough occasionally, but a cough that happens with other symptoms or lasts a long time may be a sign of a condition that needs treatment. A cough may last only 2-3 weeks (acute), or it may last longer than 8 weeks (chronic). What are the causes? Coughing is commonly caused by:  Breathing in substances that irritate your lungs.  A viral or bacterial respiratory infection.  Allergies.  Asthma.  Postnasal drip.  Smoking.  Acid backing up from the stomach into the esophagus (gastroesophageal reflux).  Certain medicines.  Chronic lung problems, including COPD (or rarely, lung cancer).  Other medical conditions such as heart failure.  Follow these instructions at home: Pay attention to any changes in your symptoms. Take these actions to help with your discomfort:  Take medicines only as told by your health care provider. ? If you were prescribed an antibiotic medicine, take it as told by your health care provider. Do not stop taking the antibiotic even if you start to feel better. ? Talk with your health care provider before you take a cough suppressant medicine.  Drink enough fluid to keep your urine clear or pale yellow.  If the air is dry, use a cold steam vaporizer or humidifier in your bedroom or your home to help loosen secretions.  Avoid anything that causes you to cough at work or at home.  If your cough is worse at night, try sleeping in a semi-upright position.  Avoid cigarette smoke. If you smoke, quit smoking. If you need help quitting, ask your health care provider.  Avoid caffeine.  Avoid alcohol.  Rest as needed.  Contact a health care provider if:  You have new symptoms.  You cough up pus.  Your cough does not get better after 2-3 weeks, or your cough gets worse.  You cannot control your cough with suppressant  medicines and you are losing sleep.  You develop pain that is getting worse or pain that is not controlled with pain medicines.  You have a fever.  You have unexplained weight loss.  You have night sweats. Get help right away if:  You cough up blood.  You have difficulty breathing.  Your heartbeat is very fast. This information is not intended to replace advice given to you by your health care provider. Make sure you discuss any questions you have with your health care provider. Document Released: 08/16/2010 Document Revised: 07/26/2015 Document Reviewed: 04/26/2014 Elsevier Interactive Patient Education  2017 Elsevier Inc.  

## 2016-04-23 ENCOUNTER — Encounter: Payer: Self-pay | Admitting: Internal Medicine

## 2016-05-02 ENCOUNTER — Telehealth: Payer: Self-pay | Admitting: Internal Medicine

## 2016-05-02 NOTE — Telephone Encounter (Signed)
Pt called in and wants to know if she could be worked in on Monday.  She is still having problems with pain her back and chest.

## 2016-05-04 NOTE — Telephone Encounter (Signed)
Call her - see how she is feeling?  If she needs to be seen see if anyone has an opening - I may be able to fit her in.

## 2016-05-05 NOTE — Telephone Encounter (Signed)
Was able to schedule her for tomorrow with Dr. Quay Burow

## 2016-05-06 ENCOUNTER — Encounter: Payer: Self-pay | Admitting: Internal Medicine

## 2016-05-06 ENCOUNTER — Ambulatory Visit (INDEPENDENT_AMBULATORY_CARE_PROVIDER_SITE_OTHER): Payer: Commercial Managed Care - HMO | Admitting: Internal Medicine

## 2016-05-06 VITALS — BP 124/82 | HR 67 | Temp 97.8°F | Resp 16 | Wt 126.0 lb

## 2016-05-06 DIAGNOSIS — M549 Dorsalgia, unspecified: Secondary | ICD-10-CM | POA: Diagnosis not present

## 2016-05-06 DIAGNOSIS — R0789 Other chest pain: Secondary | ICD-10-CM

## 2016-05-06 DIAGNOSIS — R0602 Shortness of breath: Secondary | ICD-10-CM | POA: Diagnosis not present

## 2016-05-06 DIAGNOSIS — K219 Gastro-esophageal reflux disease without esophagitis: Secondary | ICD-10-CM | POA: Diagnosis not present

## 2016-05-06 MED ORDER — MELOXICAM 7.5 MG PO TABS: 7.5000 mg | ORAL_TABLET | Freq: Every day | ORAL | 0 refills | Status: DC

## 2016-05-06 MED ORDER — PANTOPRAZOLE SODIUM 40 MG PO TBEC: 40.0000 mg | DELAYED_RELEASE_TABLET | Freq: Every day | ORAL | 3 refills | Status: DC

## 2016-05-06 MED ORDER — ALPRAZOLAM 0.25 MG PO TABS: 0.2500 mg | ORAL_TABLET | Freq: Every day | ORAL | 1 refills | Status: DC | PRN

## 2016-05-06 NOTE — Progress Notes (Signed)
Subjective:    Patient ID: [name], female    DOB: [birth year], 71 y.o.   MRN: [id number]  HPI She is here for an acute visit.   She was seen 04/01/16 and diagnosed with acute bronchitis.  Her CXR was normal.  She was prescribed doxycycline, hycodan cough syrup.   The antibiotic did not help.    She was seen 04/11/16 and diagnosed with acute bronchitis.  She was prescribed prednisone taper, albuterol.  She thinks the prednisone helped a little, but is not sure.    She was seen for her cough 2/20 and was prescribed tussionex and Bevespi.  She used the inhaler for two weeks.  She has burning in her throat, chest and feels it may be related to the inhaler, which she stopped using.  She is still SOB.  She has discomfort in the chest and back - it is a nagging discomfort. The chest discomfort and back discomfort is worse with deep breaths and movement.  She uses a heating pad and that helps. She has not tried otc pain medications.  Swallowing is tight and her airway feels tight.  She is not sure if she has asthma.  She is a former smoker.  CXRs have shown some COPD. She does not have SOB on a chronic basis.  A spirometry was done last fall and was normal.   bp has been on the low side  101/63, 99/65, 99/62.  She gets lightheaded and feels like she will fall when she stands up.  It gets better once she is up but lasts a little bit. She is drinking plenty of water.   Medications and allergies reviewed with patient and updated if appropriate.  Patient Active Problem List   Diagnosis Date Noted  . Mixed simple and mucopurulent chronic bronchitis ([place]) 04/22/2016  . Acute bronchitis 04/01/2016  . Family history of diabetes mellitus (DM) 12/07/2015  . Constipation due to outlet dysfunction   . Obstructive sleep apnea 07/12/2015  . Seasonal and perennial allergic rhinitis 07/12/2015  . Anemia 12/05/2014  . Vitamin D deficiency 12/05/2014  . Sinusitis, chronic 05/30/2014   Class: Chronic  . Vaginal atrophy 10/20/2012  . Insomnia 10/20/2012  . Orthostatic hypotension 02/16/2012  . Cough 02/13/2011  . Palpitations 12/31/2010  . GERD (gastroesophageal reflux disease) 07/25/2010  . Depression with anxiety 07/25/2010  . Lymphocytic colitis 06/14/2010  . Gastroesophageal reflux disease with hiatal hernia   . BACK PAIN, CHRONIC 12/27/2009  . Osteopenia 12/27/2009  . 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 Hereditary and idiopathic peripheral neuropathy 12/22/2007  . Hyperlipidemia 03/09/2007  . Generalized anxiety disorder 03/09/2007  . MIGRAINE HEADACHE 03/09/2007  . IBS 03/09/2007  . SPONDYLOSIS 03/09/2007  . Myalgia and myositis 03/09/2007  . SCHATZKI'S RING, HX OF 03/09/2007  . HIATAL HERNIA WITH REFLUX 12/30/2006  . GASTRITIS 02/17/2005  . DIVERTICULOSIS OF COLON 01/01/2000    Current Outpatient Prescriptions on File Prior to Visit  Medication Sig Dispense Refill  . ALPRAZolam (XANAX) 0.25 MG tablet Take 1 tablet (0.25 mg total) by mouth daily as needed for anxiety. 30 tablet 0  . b complex vitamins capsule Take 1 capsule by mouth daily.    . Biotin 1000 MCG tablet Take 1,000 mcg by mouth daily.      . Estradiol (VAGIFEM) 10 MCG TABS vaginal tablet Place 1 tablet (10 mcg total) vaginally 2 (two) times a week. 8 tablet 11  . Glycopyrrolate-Formoterol (BEVESPI AEROSPHERE) 9-4.8 MCG/ACT AERO  Inhale 2 puffs into the lungs 2 (two) times daily. 10.7 g 11  . Magnesium Oxide 250 MG TABS Take 250 mg by mouth daily.     . Probiotic Product (ALIGN) 4 MG CAPS Take 1 capsule by mouth daily.     . sodium chloride (OCEAN) 0.65 % SOLN nasal spray Place 1 spray into both nostrils as needed for congestion.    . traZODone (DESYREL) 150 MG tablet TAKE ONE (1) TO TWO (2) TABLETS BY MOUTHAT BEDTIME 30 tablet 4  . vitamin C (ASCORBIC ACID) 500 MG tablet Take 500 mg by mouth daily.    . [DISCONTINUED] colesevelam (WELCHOL) 625 MG tablet Take 625 mg  by mouth 2 (two) times daily with a meal. Take one tablet by mouth twice a day    . [DISCONTINUED] Hyoscyamine-Phenyltoloxamine ([place] NF) [id number] MG CAPS Take one tablet by mouth as needed for abdominal cramping 24 each 0   No current facility-administered medications on file prior to visit.     Past Medical History:  Diagnosis Date  . Anxiety   . Complication of anesthesia   . Depression    generalized anxiety disorder  . Diverticulosis   . Duodenal diverticulum   . Family history of adverse reaction to anesthesia    Mother and Daughters- N/V  . Fibromyalgia   . Gastroesophageal reflux disease with hiatal hernia   . Hiatal hernia   . History of kidney stones   . Hyperlipidemia   . IBS (irritable bowel syndrome)   . Lymphocytic colitis    Dr Sharlett Iles  . Migraine headache   . Mitral valve prolapse   . Neuropathy ([place])   . Osteopenia    BMD ordered by GYN  . Peripheral neuropathy ([place])    treated as RLS by  Neurology  . PONV (postoperative nausea and vomiting)   . Restless leg syndrome   . Schatzki's ring    History of  . Sleep apnea    On CPAP, has not been using  . Spondylosis     Past Surgical History:  Procedure Laterality Date  . ANAL RECTAL MANOMETRY N/A 11/14/2015   Procedure: ANO RECTAL MANOMETRY;  Surgeon: Gatha Mayer, MD;  Location: [facility];  Service: Endoscopy;  Laterality: N/A;  . BREAST ENHANCEMENT SURGERY    . CATARACT EXTRACTION Bilateral   . cataract surgery Left    Dr Bing Plume  . CHOLECYSTECTOMY    . COLONOSCOPY    . ESOPHAGEAL DILATION     X 2  . ROTATOR CUFF REPAIR Left   . SINUS ENDO W/FUSION N/A 05/30/2014   Procedure: REVISION  FRONTAL SINUS SURGERY WITH FUSION SCAN;  Surgeon: Jerrell Belfast, MD;  Location: [facility];  Service: ENT;  Laterality: N/A;  . SINUS SURGERY WITH INSTATRAK     x 2  . TUBAL LIGATION    . Vaginal cystectomy     x 2  . VAGINAL HYSTERECTOMY  05/2007   Vaginal repair, Dr Ubaldo Glassing.  Partial  hysterectomy.     Social History   Social History  . Marital status: Married    Spouse name: N/A  . Number of children: 2  . Years of education: 29   Occupational History  . retired [employer]   Social History Main Topics  . Smoking status: Former Smoker    Packs/day: 0.50    Years: 15.00    Types: Cigarettes    Quit date: 03/03/1998  . Smokeless tobacco: Never Used  Comment: smoked 1973- 2006, up to <  1  ppd  . Alcohol use No  . Drug use: No  . Sexual activity: Yes   Other Topics Concern  . None   Social History Narrative   HAS REGULAR EXERCISE   DAILY CAFFEINE: 1-2 CUPS   Patient is right handed.    Family History  Problem Relation Age of Onset  . Hypertension Father 1  . Stroke Father 46  . Heart attack Father      ? in 29s  . Hypertension Mother   . Neuropathy Mother   . Anemia Mother   . Coronary artery disease Brother     Stent placement in 60s  . Diabetes Maternal Uncle   . Heart attack Paternal Carmel [place] , ? age  . Colon cancer Neg Hx   . Seizures Neg Hx     Review of Systems  Constitutional: Negative for chills and fever.  HENT: Negative for sore throat and trouble swallowing (feels like she does not swallow as good).   Respiratory: Positive for cough (occ, dry - ? allergies), chest tightness and shortness of breath (with walking and at rest - can't breath deep enough). Negative for wheezing.   Cardiovascular: Positive for chest pain and palpitations. Negative for leg swelling.  Gastrointestinal: Positive for nausea (sometimes). Negative for abdominal pain.       No gerd  Musculoskeletal: Positive for back pain.       Objective:   Vitals:   05/06/16 1000  BP: 124/82  Pulse: 67  Resp: 16  Temp: 97.8 F (36.6 C)   Filed Weights   05/06/16 1000  Weight: 126 lb (57.2 kg)   Body mass index is 18.88 kg/m.  Wt Readings from Last 3 Encounters:  05/06/16 126 lb (57.2 kg)  04/22/16 126 lb 0.6 oz (57.2 kg)  04/11/16 127 lb (57.6 kg)      Physical Exam GENERAL APPEARANCE: Appears stated age, well appearing, NAD EYES: conjunctiva clear, no icterus HEENT: bilateral tympanic membranes and ear canals normal, oropharynx with no erythema, no thyromegaly, trachea midline, no cervical or supraclavicular lymphadenopathy LUNGS: Clear to auscultation without wheeze or crackles, unlabored breathing, good air entry bilaterally CHEST: no tenderness w/ palpation HEART: Normal S1,S2 without murmurs Abd: soft, nontener MSK: back slightly tender with palpation in muscular region of b/l upper back EXTREMITIES: Without clubbing, cyanosis, or edema        Assessment & Plan:   See Problem List for Assessment and Plan of chronic medical problems.

## 2016-05-06 NOTE — Patient Instructions (Signed)
   Medications reviewed and updated.  Changes include starting meloxicam daily with food for 1-2 weeks. protonix daily for 2-4 weeks.    Your prescription(s) have been submitted to your pharmacy. Please take as directed and contact our office if you believe you are having problem(s) with the medication(s).  A referral was ordered for pulmonary.

## 2016-05-06 NOTE — Progress Notes (Signed)
Pre visit review using our clinic review tool, if applicable. No additional management support is needed unless otherwise documented below in the visit note. 

## 2016-05-07 DIAGNOSIS — R0789 Other chest pain: Secondary | ICD-10-CM | POA: Insufficient documentation

## 2016-05-07 DIAGNOSIS — M549 Dorsalgia, unspecified: Secondary | ICD-10-CM | POA: Insufficient documentation

## 2016-05-07 NOTE — Assessment & Plan Note (Signed)
Has a history of GERD and gastritis Some of her symptoms are suggestive of GERD - atypical GERD - esp in setting of prednisone Start protonix 40 mg daily - will eventually taper off

## 2016-05-07 NOTE — Assessment & Plan Note (Signed)
Acute upper back discomfort and burning sensation Atypical for cardiac pain  reproducible ? Muscular in nature ? GERD in nature Will try protonix for at least one month - prednisone may have flared up gerd and is having atypical gerd Continue heating pad, try meloxicam for two weeks - hopefully this will not flare up gerd, but will resolve muscular inflammation

## 2016-05-07 NOTE — Assessment & Plan Note (Signed)
Has a discomfort and a burning type pain Atypical for cardiac pain Not reproducible ? Muscular in nature ? GERD in nature Will try protonix for at least one month - prednisone may have flared up gerd and is having atypical gerd Continue heating pad, try meloxicam for two weeks - hopefully this will not flare up gerd, but will resolve muscular inflammation

## 2016-05-07 NOTE — Assessment & Plan Note (Signed)
Spirometry was normal in 2017 - SOB and chest tightness may be some reactive airway disease, but exam is normal  Has already been on prednisone and inhalers without significant improvement ? Component of GERD and muscular pain She is concerned about copd - will refer to pulm - if symptoms resolve does not need to see them

## 2016-05-09 ENCOUNTER — Telehealth: Payer: Self-pay | Admitting: Internal Medicine

## 2016-05-09 NOTE — Telephone Encounter (Signed)
Patient called in and said Dr. Quay Burow wanted her to call in and talk to the nurse about how she is doing.   Patient states she is doing much better. She still wanted the nurse to call her back and talk with her. Thank you.

## 2016-05-12 ENCOUNTER — Institutional Professional Consult (permissible substitution): Payer: Self-pay | Admitting: Emergency Medicine

## 2016-05-12 NOTE — Telephone Encounter (Signed)
LVM for pt to return call

## 2016-05-21 ENCOUNTER — Ambulatory Visit: Payer: Self-pay | Admitting: Physical Therapy

## 2016-05-23 ENCOUNTER — Encounter: Payer: Self-pay | Admitting: Gynecology

## 2016-05-28 ENCOUNTER — Encounter: Payer: Self-pay | Admitting: Physical Therapy

## 2016-06-05 ENCOUNTER — Encounter: Payer: Self-pay | Admitting: Physical Therapy

## 2016-06-11 ENCOUNTER — Ambulatory Visit: Payer: Commercial Managed Care - HMO | Admitting: Physical Therapy

## 2016-06-30 ENCOUNTER — Encounter: Payer: Self-pay | Admitting: Physical Therapy

## 2016-06-30 ENCOUNTER — Institutional Professional Consult (permissible substitution): Payer: Self-pay | Admitting: Emergency Medicine

## 2016-07-16 ENCOUNTER — Encounter: Payer: Self-pay | Admitting: Gynecology

## 2016-08-05 ENCOUNTER — Ambulatory Visit (INDEPENDENT_AMBULATORY_CARE_PROVIDER_SITE_OTHER): Payer: Commercial Managed Care - HMO | Admitting: Emergency Medicine

## 2016-08-05 ENCOUNTER — Encounter: Payer: Self-pay | Admitting: Emergency Medicine

## 2016-08-05 DIAGNOSIS — R05 Cough: Secondary | ICD-10-CM | POA: Diagnosis not present

## 2016-08-05 DIAGNOSIS — R059 Cough, unspecified: Secondary | ICD-10-CM

## 2016-08-05 DIAGNOSIS — K449 Diaphragmatic hernia without obstruction or gangrene: Secondary | ICD-10-CM

## 2016-08-05 DIAGNOSIS — R0602 Shortness of breath: Secondary | ICD-10-CM

## 2016-08-05 DIAGNOSIS — G4733 Obstructive sleep apnea (adult) (pediatric): Secondary | ICD-10-CM | POA: Diagnosis not present

## 2016-08-05 NOTE — Assessment & Plan Note (Signed)
Suspect that this is contributing to her cough and her sensation of burning, dyspnea. Start her on Protonix now followed for improvement in both her reflux symptoms, cough

## 2016-08-05 NOTE — Addendum Note (Signed)
Addended by: Benson Setting L on: 08/05/2016 04:20 PM   Modules accepted: Orders

## 2016-08-05 NOTE — Patient Instructions (Addendum)
Please start taking protonix 40mg  daily Continue your nasal spray once a day. Start taking this in the morning. Call to let us know the name of this medication.  We will perform repeat pulmonary function testing to compare with 2012.  Follow with Dr Lamonte Sakai next available with full PFT to review

## 2016-08-05 NOTE — Assessment & Plan Note (Signed)
Untreated  

## 2016-08-05 NOTE — Assessment & Plan Note (Signed)
Perform pulmonary function testing. Clinical syndrome and chest x-ray suggest possible obstructive disease. No bronchodilators at this time. She may have benefited from [place], consider restart depending on the PFT

## 2016-08-05 NOTE — Addendum Note (Signed)
Addended by: Benson Setting L on: 08/05/2016 04:25 PM   Modules accepted: Orders

## 2016-08-05 NOTE — Progress Notes (Signed)
Subjective:    Patient ID: [name], female    DOB: September 06, 1945, 71 y.o.   MRN: [id number]  HPI 71 year old woman, former smoker (10 + pack years), history of hiatal hernia and GERD, fibromyalgia, depression/anxiety, IBS, obstructive sleep apnea (seen by [facility] before, not on CPAP), allergic rhinitis. She has been seen by Dr Quay Burow for dyspnea and cough, seemed to evolve after she had a flu-like illness beginning of 2018. She was treated for acute bronchitis with abx + pred, complicated by possible thrush. She is somewhat improved but notes that she keeps a cough, dyspnea with exertion > includes rushing through the house, doing yard work. She had normal PFT in 2012. She questions whether this may have asthma. She was given bevespi and thinks that she may have benefited. She occasionally has breakthrough GERD. She has some nasal allergies and says that she takes a nasal spray (? Dymista).                        Review of Systems  Constitutional: Negative.  Negative for fever and unexpected weight change.  HENT: Positive for ear pain and sneezing. Negative for congestion, dental problem, nosebleeds, postnasal drip, rhinorrhea, sinus pressure, sore throat and trouble swallowing.   Eyes: Negative.  Negative for redness and itching.  Respiratory: Positive for cough, chest tightness and shortness of breath. Negative for wheezing.   Cardiovascular: Negative.  Negative for palpitations and leg swelling.  Gastrointestinal: Negative.  Negative for nausea and vomiting.  Endocrine: Negative.   Genitourinary: Negative.  Negative for dysuria.  Musculoskeletal: Negative.  Negative for joint swelling.  Skin: Negative.  Negative for rash.  Allergic/Immunologic: Negative.  Negative for environmental allergies, food allergies and immunocompromised state.  Neurological: Positive for dizziness. Negative for headaches.  Hematological: Bruises/bleeds easily.  Psychiatric/Behavioral: Negative.   Negative for dysphoric mood. The patient is not nervous/anxious.    Past Medical History:  Diagnosis Date  . Anxiety   . Complication of anesthesia   . Depression    generalized anxiety disorder  . Diverticulosis   . Duodenal diverticulum   . Family history of adverse reaction to anesthesia    Mother and Daughters- N/V  . Fibromyalgia   . Gastroesophageal reflux disease with hiatal hernia   . Hiatal hernia   . History of kidney stones   . Hyperlipidemia   . IBS (irritable bowel syndrome)   . Lymphocytic colitis    Dr Sharlett Iles  . Migraine headache   . Mitral valve prolapse   . Neuropathy   . Osteopenia    BMD ordered by GYN  . Peripheral neuropathy    treated as RLS by  Neurology  . PONV (postoperative nausea and vomiting)   . Restless leg syndrome   . Schatzki's ring    History of  . Sleep apnea    On CPAP, has not been using  . Spondylosis      Family History  Problem Relation Age of Onset  . Hypertension Father 4  . Stroke Father 8  . Heart attack Father         ? in 61s  . Hypertension Mother   . Neuropathy Mother   . Anemia Mother   . Coronary artery disease Brother        Stent placement in 60s  . Diabetes Maternal Uncle   . Heart attack Paternal Carmel [place] , ? age  . Colon cancer  Neg Hx   . Seizures Neg Hx      Social History   Social History  . Marital status: Married    Spouse name: N/A  . Number of children: 2  . Years of education: 51   Occupational History  . retired [employer]   Social History Main Topics  . Smoking status: Former Smoker    Packs/day: 0.50    Years: 15.00    Types: Cigarettes    Quit date: 03/03/1998  . Smokeless tobacco: Never Used     Comment: smoked 1973- 2006, up to <  1  ppd  . Alcohol use No  . Drug use: No  . Sexual activity: Yes   Other Topics Concern  . Not on file   Social History Narrative   HAS REGULAR EXERCISE   DAILY CAFFEINE: 1-2 CUPS   Patient is right handed.     Allergies    Allergen Reactions  . Adhesive [Tape] Rash  . Albuterol     heartburn  . Cymbalta [Duloxetine Hcl]     Diarrhea, nausea, anxiety worse, shaky  . Lyrica [Pregabalin] Other (See Comments)    Sedation  . Neurontin [Gabapentin] Other (See Comments)    Sedation  . Nitrofuran Derivatives Other (See Comments)    "tingling"  . Paxil [Paroxetine Hcl]     Bowel upset, tingling  . Prozac [Fluoxetine Hcl] Other (See Comments)    Made patient worse and constipation     Outpatient Medications Prior to Visit  Medication Sig Dispense Refill  . ALPRAZolam (XANAX) 0.25 MG tablet Take 1 tablet (0.25 mg total) by mouth daily as needed for anxiety. 30 tablet 1  . b complex vitamins capsule Take 1 capsule by mouth daily.    . Biotin 1000 MCG tablet Take 1,000 mcg by mouth daily.      . Estradiol (VAGIFEM) 10 MCG TABS vaginal tablet Place 1 tablet (10 mcg total) vaginally 2 (two) times a week. 8 tablet 11  . Probiotic Product (ALIGN) 4 MG CAPS Take 1 capsule by mouth daily.     . sodium chloride (OCEAN) 0.65 % SOLN nasal spray Place 1 spray into both nostrils as needed for congestion.    . traZODone (DESYREL) 150 MG tablet TAKE ONE (1) TO TWO (2) TABLETS BY MOUTHAT BEDTIME 30 tablet 4  . vitamin C (ASCORBIC ACID) 500 MG tablet Take 500 mg by mouth daily.    . Glycopyrrolate-Formoterol (BEVESPI AEROSPHERE) 9-4.8 MCG/ACT AERO Inhale 2 puffs into the lungs 2 (two) times daily. (Patient not taking: Reported on 08/05/2016) 10.7 g 11  . Magnesium Oxide 250 MG TABS Take 250 mg by mouth daily.     . meloxicam (MOBIC) 7.5 MG tablet Take 1 tablet (7.5 mg total) by mouth daily. (Patient not taking: Reported on 08/05/2016) 30 tablet 0  . pantoprazole (PROTONIX) 40 MG tablet Take 1 tablet (40 mg total) by mouth daily. (Patient not taking: Reported on 08/05/2016) 30 tablet 3   No facility-administered medications prior to visit.         Objective:   Physical Exam Vitals:   08/05/16 1554  BP: 100/68  Pulse: 63   SpO2: 99%  Weight: 126 lb 9.6 oz (57.4 kg)  Height: 5' 8.5" (1.74 m)   Gen: Pleasant, well-nourished, in no distress,  normal affect  ENT: No lesions,  mouth clear,  oropharynx clear, no postnasal drip  Neck: No JVD, no stridor  Lungs: No use of accessory muscles, clear B  Cardiovascular: RRR, heart sounds normal, no murmur or gallops, no peripheral edema  Musculoskeletal: No deformities, no cyanosis or clubbing  Neuro: alert, non focal  Skin: Warm, no lesions or rashes, very tan      Assessment & Plan:  Obstructive sleep apnea Untreated  Diaphragmatic hernia Suspect that this is contributing to her cough and her sensation of burning, dyspnea. Start her on Protonix now followed for improvement in both her reflux symptoms, cough  Cough Suspected contributions of both rhinitis and GERD. We will work to treat both. Need to rule out occult asthma. Pulmonary function testing normal in 2012, needs to be repeated now.  SOB (shortness of breath) Perform pulmonary function testing. Clinical syndrome and chest x-ray suggest possible obstructive disease. No bronchodilators at this time. She may have benefited from [place], consider restart depending on the PFT  Baltazar Apo, MD, PhD 08/05/2016, 4:17 PM [facility] [phone] or if no answer [phone]

## 2016-08-05 NOTE — Assessment & Plan Note (Signed)
Suspected contributions of both rhinitis and GERD. We will work to treat both. Need to rule out occult asthma. Pulmonary function testing normal in 2012, needs to be repeated now.

## 2016-08-06 ENCOUNTER — Encounter: Payer: Self-pay | Admitting: Gynecology

## 2016-08-06 ENCOUNTER — Telehealth: Payer: Self-pay | Admitting: Emergency Medicine

## 2016-08-06 ENCOUNTER — Ambulatory Visit (INDEPENDENT_AMBULATORY_CARE_PROVIDER_SITE_OTHER): Payer: Commercial Managed Care - HMO | Admitting: Gynecology

## 2016-08-06 VITALS — BP 108/72 | Ht 68.5 in | Wt 126.8 lb

## 2016-08-06 DIAGNOSIS — N952 Postmenopausal atrophic vaginitis: Secondary | ICD-10-CM | POA: Diagnosis not present

## 2016-08-06 DIAGNOSIS — Z79890 Hormone replacement therapy (postmenopausal): Secondary | ICD-10-CM

## 2016-08-06 DIAGNOSIS — N941 Unspecified dyspareunia: Secondary | ICD-10-CM | POA: Diagnosis not present

## 2016-08-06 MED ORDER — ESTRADIOL 0.1 MG/GM VA CREA: TOPICAL_CREAM | VAGINAL | 8 refills | Status: DC

## 2016-08-06 NOTE — Progress Notes (Signed)
Patient ID: [name], female   DOB: 09-Apr-1945, 71 y.o.   MRN: [id number]     Patient is a 71 year old that presented to the office today with a complaint of painful intercourse and vaginal dryness. Review of her record indicated that in 2016 she was started on Vagifem 10 g twice a week for the similar symptoms. Also in 2009 Dr. Ubaldo [name] had done a vaginal hysterectomy with A&P repair as well as enterocele repair and colposuspension. Patient with no GU or GI complaints.  Exam: Gen. appearance well-developed well-nourished female with above mentioned complaint Pelvic: Bartholin urethra Skene glands with atrophic changes Vagina: Vaginal mucosa with atrophic changes vaginal cuff intact Bimanual exam: Unremarkable no palpable masses or tenderness Rectal exam not done   Assessment/plan: Os menopausal patient with vaginal atrophy and dyspareunia despite being on Vagifem 10 g twice a week. Within start her on a low-dose vaginal estrogen cream such as Estrace 1 g to apply every other day for 5 days and use 2-3 times a week as well as to apply to the external genitalia as well as. She is due for her annual exam in September this year will see how she is responding. Her husband was present during the evaluation and he had mentioned that the gynecologist who did her hysterectomy was going to try to narrow her vagina at that time. If she continues to have discomfort despite the above-mentioned regimen I discussed possibly prescribing vaginal dilators for her to gradually increase in size to help with her discomfort.

## 2016-08-06 NOTE — Telephone Encounter (Signed)
lmtcb x1 for pt. Will send in Rx after verifying pharmacy.

## 2016-08-06 NOTE — Patient Instructions (Signed)
Estradiol vaginal cream What is this medicine? ESTRADIOL (es tra DYE ole) contains the female hormone estrogen. It is used for symptoms of menopause, like vaginal dryness and irritation. This medicine may be used for other purposes; ask your health care provider or pharmacist if you have questions. COMMON BRAND NAME(S): Estrace What should I tell my health care provider before I take this medicine? They need to know if you have any of these conditions: -abnormal vaginal bleeding -blood vessel disease or blood clots -breast, cervical, endometrial, ovarian, liver, or uterine cancer -dementia -diabetes -gallbladder disease -heart disease or recent heart attack -high blood pressure -high cholesterol -high levels of calcium in the blood -hysterectomy -kidney disease -liver disease -migraine headaches -protein C deficiency -protein S deficiency -stroke -systemic lupus erythematosus (SLE) -tobacco smoker -an unusual or allergic reaction to estrogens, other hormones, soy, other medicines, foods, dyes, or preservatives -pregnant or trying to get pregnant -breast-feeding How should I use this medicine? This medicine is for use in the vagina only. Do not take by mouth. Follow the directions on the prescription label. Read package directions carefully before using. Use the special applicator supplied with the cream. Wash hands before and after use. Fill the applicator with the prescribed amount of cream. Lie on your back, part and bend your knees. Insert the applicator into the vagina and push the plunger to expel the cream into the vagina. Wash the applicator with warm soapy water and rinse well. Use exactly as directed for the complete length of time prescribed. Do not stop using except on the advice of your doctor or health care professional. A patient package insert for the product will be given with each prescription and refill. Read this sheet carefully each time. The sheet may change  frequently. Talk to your pediatrician regarding the use of this medicine in children. This medicine is not approved for use in children. Overdosage: If you think you have taken too much of this medicine contact a poison control center or emergency room at once. NOTE: This medicine is only for you. Do not share this medicine with others. What if I miss a dose? If you miss a dose, use it as soon as you can. If it is almost time for your next dose, use only that dose. Do not use double or extra doses. What may interact with this medicine? Do not take this medicine with any of the following medications: -aromatase inhibitors like aminoglutethimide, anastrozole, exemestane, letrozole, testolactone This medicine may also interact with the following medications: -barbiturates used for inducing sleep or treating seizures -carbamazepine -grapefruit juice -medicines for fungal infections like ketoconazole and itraconazole -raloxifene -rifabutin -rifampin -rifapentine -ritonavir -some antibiotics used to treat infections -St. John's Wort -tamoxifen -warfarin This list may not describe all possible interactions. Give your health care provider a list of all the medicines, herbs, non-prescription drugs, or dietary supplements you use. Also tell them if you smoke, drink alcohol, or use illegal drugs. Some items may interact with your medicine. What should I watch for while using this medicine? Visit your health care professional for regular checks on your progress. You will need a regular breast and pelvic exam. You should also discuss the need for regular mammograms with your health care professional, and follow his or her guidelines. This medicine can make your body retain fluid, making your fingers, hands, or ankles swell. Your blood pressure can go up. Contact your doctor or health care professional if you feel you are retaining fluid. If you have  any reason to think you are pregnant, stop taking  this medicine at once and contact your doctor or health care professional. Tobacco smoking increases the risk of getting a blood clot or having a stroke, especially if you are more than 73 years old. You are strongly advised not to smoke. If you wear contact lenses and notice visual changes, or if the lenses begin to feel uncomfortable, consult your eye care specialist. If you are going to have elective surgery, you may need to stop taking this medicine beforehand. Consult your health care professional for advice prior to scheduling the surgery. What side effects may I notice from receiving this medicine? Side effects that you should report to your doctor or health care professional as soon as possible: -allergic reactions like skin rash, itching or hives, swelling of the face, lips, or tongue -breast tissue changes or discharge -changes in vision -chest pain -confusion, trouble speaking or understanding -dark urine -general ill feeling or flu-like symptoms -light-colored stools -nausea, vomiting -pain, swelling, warmth in the leg -right upper belly pain -severe headaches -shortness of breath -sudden numbness or weakness of the face, arm or leg -trouble walking, dizziness, loss of balance or coordination -unusual vaginal bleeding -yellowing of the eyes or skin Side effects that usually do not require medical attention (report to your doctor or health care professional if they continue or are bothersome): -hair loss -increased hunger or thirst -increased urination -symptoms of vaginal infection like itching, irritation or unusual discharge -unusually weak or tired This list may not describe all possible side effects. Call your doctor for medical advice about side effects. You may report side effects to FDA at 1-800-FDA-1088. Where should I keep my medicine? Keep out of the reach of children. Store at room temperature between 15 and 30 degrees C (59 and 86 degrees F). Protect from  temperatures above 40 degrees C (104 degrees C). Do not freeze. Throw away any unused medicine after the expiration date. NOTE: This sheet is a summary. It may not cover all possible information. If you have questions about this medicine, talk to your doctor, pharmacist, or health care provider.  2018 Elsevier/Gold Standard (2010-05-22 09:18:12)

## 2016-08-07 NOTE — Telephone Encounter (Signed)
Spoke with pt. She was just wanting to let us know that she is using Dymista 2 sprays daily. This has been added to her medication list. She doesn't need a refill that this time. Nothing further was needed.

## 2016-08-29 ENCOUNTER — Encounter: Payer: Self-pay | Admitting: Emergency Medicine

## 2016-08-29 ENCOUNTER — Ambulatory Visit (INDEPENDENT_AMBULATORY_CARE_PROVIDER_SITE_OTHER): Payer: 59 | Admitting: Emergency Medicine

## 2016-08-29 DIAGNOSIS — J45909 Unspecified asthma, uncomplicated: Secondary | ICD-10-CM | POA: Diagnosis not present

## 2016-08-29 DIAGNOSIS — K449 Diaphragmatic hernia without obstruction or gangrene: Secondary | ICD-10-CM

## 2016-08-29 DIAGNOSIS — R0602 Shortness of breath: Secondary | ICD-10-CM

## 2016-08-29 DIAGNOSIS — R059 Cough, unspecified: Secondary | ICD-10-CM

## 2016-08-29 DIAGNOSIS — R05 Cough: Secondary | ICD-10-CM

## 2016-08-29 LAB — PULMONARY FUNCTION TEST
DL/VA % pred: 69 %
DL/VA: 3.52 ml/min/mmHg/L
DLCO unc % pred: 63 %
DLCO unc: 17.45 ml/min/mmHg
FEF 25-75 Post: 2.64 L/sec
FEF 25-75 Pre: 1.85 L/sec
FEF2575-%CHANGE-POST: 42 %
FEF2575-%PRED-POST: 136 %
FEF2575-%Pred-Pre: 95 %
FEV1-%CHANGE-POST: 13 %
FEV1-%PRED-PRE: 102 %
FEV1-%Pred-Post: 116 %
FEV1-POST: 2.83 L
FEV1-Pre: 2.49 L
FEV1FVC-%Change-Post: 6 %
FEV1FVC-%Pred-Pre: 97 %
FEV6-%Change-Post: 6 %
FEV6-%PRED-POST: 118 %
FEV6-%PRED-PRE: 111 %
FEV6-PRE: 3.41 L
FEV6-Post: 3.63 L
FEV6FVC-%Change-Post: 0 %
FEV6FVC-%PRED-PRE: 105 %
FEV6FVC-%Pred-Post: 104 %
FVC-%CHANGE-POST: 6 %
FVC-%PRED-POST: 113 %
FVC-%Pred-Pre: 106 %
FVC-Post: 3.65 L
FVC-Pre: 3.42 L
POST FEV1/FVC RATIO: 77 %
PRE FEV6/FVC RATIO: 100 %
Post FEV6/FVC ratio: 100 %
Pre FEV1/FVC ratio: 73 %
RV % pred: 112 %
RV: 2.65 L
TLC % pred: 112 %
TLC: 6.08 L

## 2016-08-29 MED ORDER — BUDESONIDE-FORMOTEROL FUMARATE 160-4.5 MCG/ACT IN AERO: 2.0000 | INHALATION_SPRAY | Freq: Two times a day (BID) | RESPIRATORY_TRACT | 0 refills | Status: DC

## 2016-08-29 MED ORDER — ALBUTEROL SULFATE HFA 108 (90 BASE) MCG/ACT IN AERS: 2.0000 | INHALATION_SPRAY | Freq: Four times a day (QID) | RESPIRATORY_TRACT | 5 refills | Status: DC | PRN

## 2016-08-29 NOTE — Assessment & Plan Note (Signed)
Untreated  

## 2016-08-29 NOTE — Progress Notes (Signed)
PFT done today. 

## 2016-08-29 NOTE — Progress Notes (Signed)
Subjective:    Patient ID: [name], female    DOB: Jul 27, 1945, 71 y.o.   MRN: [id number]  HPI 71 year old woman, former smoker (10 + pack years), history of hiatal hernia and GERD, fibromyalgia, depression/anxiety, IBS, obstructive sleep apnea (seen by CY before, not on CPAP), allergic rhinitis. She has been seen by Dr Quay Burow for dyspnea and cough, seemed to evolve after she had a flu-like illness beginning of 2018. She was treated for acute bronchitis with abx + pred, complicated by possible thrush. She is somewhat improved but notes that she keeps a cough, dyspnea with exertion > includes rushing through the house, doing yard work. She had normal PFT in 2012. She questions whether this may have asthma. She was given bevespi and thinks that she may have benefited. She occasionally has breakthrough GERD. She has some nasal allergies and says that she takes a nasal spray (? Dymista).                       ROV 08/29/16 -- Patient with a history of multiple medical problems as outlined above including obstructive sleep apnea, presents for follow-up of her dyspnea, cough. At her last visit I held her bronchodilators, started Protonix given the suspicion that GERD was contributing to her cough. We repeated her pulmonary function testing today and I have reviewed. This shows mild obstruction with a positive bronchodilator response, normal lung volumes, decreased diffusion capacity. She reports that her cough is unchanged. Her breathing has been problematic as well - DOE and also at rest. Especially with the heat or walking uphill.    Review of Systems  Constitutional: Negative.  Negative for fever and unexpected weight change.  HENT: Positive for ear pain and sneezing. Negative for congestion, dental problem, nosebleeds, postnasal drip, rhinorrhea, sinus pressure, sore throat and trouble swallowing.   Eyes: Negative.  Negative for redness and itching.  Respiratory: Positive for cough,  chest tightness and shortness of breath. Negative for wheezing.   Cardiovascular: Negative.  Negative for palpitations and leg swelling.  Gastrointestinal: Negative.  Negative for nausea and vomiting.  Endocrine: Negative.   Genitourinary: Negative.  Negative for dysuria.  Musculoskeletal: Negative.  Negative for joint swelling.  Skin: Negative.  Negative for rash.  Allergic/Immunologic: Negative.  Negative for environmental allergies, food allergies and immunocompromised state.  Neurological: Positive for dizziness. Negative for headaches.  Hematological: Bruises/bleeds easily.  Psychiatric/Behavioral: Negative.  Negative for dysphoric mood. The patient is not nervous/anxious.    Past Medical History:  Diagnosis Date  . Anxiety   . Complication of anesthesia   . Depression    generalized anxiety disorder  . Diverticulosis   . Duodenal diverticulum   . Family history of adverse reaction to anesthesia    Mother and Daughters- N/V  . Fibromyalgia   . Gastroesophageal reflux disease with hiatal hernia   . Hiatal hernia   . History of kidney stones   . Hyperlipidemia   . IBS (irritable bowel syndrome)   . Lymphocytic colitis    Dr Sharlett Iles  . Migraine headache   . Mitral valve prolapse   . Neuropathy   . Osteopenia    BMD ordered by GYN  . Peripheral neuropathy    treated as RLS by  Neurology  . PONV (postoperative nausea and vomiting)   . Restless leg syndrome   . Schatzki's ring    History of  . Sleep apnea    On CPAP, has not been using  .  Spondylosis      Family History  Problem Relation Age of Onset  . Hypertension Father 55  . Stroke Father 4  . Heart attack Father         ? in 50s  . Hypertension Mother   . Neuropathy Mother   . Anemia Mother   . Coronary artery disease Brother        Stent placement in 60s  . Diabetes Maternal Uncle   . Heart attack Paternal Carmel [place] , ? age  . Colon cancer Neg Hx   . Seizures Neg Hx      Social History     Social History  . Marital status: Married    Spouse name: N/A  . Number of children: 2  . Years of education: 49   Occupational History  . retired [employer]   Social History Main Topics  . Smoking status: Former Smoker    Packs/day: 0.50    Years: 15.00    Types: Cigarettes    Quit date: 03/03/1998  . Smokeless tobacco: Never Used     Comment: smoked 1973- 2006, up to <  1  ppd  . Alcohol use No  . Drug use: No  . Sexual activity: Yes   Other Topics Concern  . Not on file   Social History Narrative   HAS REGULAR EXERCISE   DAILY CAFFEINE: 1-2 CUPS   Patient is right handed.     Allergies  Allergen Reactions  . Adhesive [Tape] Rash  . Albuterol     heartburn  . Cymbalta [Duloxetine Hcl]     Diarrhea, nausea, anxiety worse, shaky  . Lyrica [Pregabalin] Other (See Comments)    Sedation  . Neurontin [Gabapentin] Other (See Comments)    Sedation  . Nitrofuran Derivatives Other (See Comments)    "tingling"  . Paxil [Paroxetine Hcl]     Bowel upset, tingling  . Prozac [Fluoxetine Hcl] Other (See Comments)    Made patient worse and constipation     Outpatient Medications Prior to Visit  Medication Sig Dispense Refill  . ALPRAZolam (XANAX) 0.25 MG tablet Take 1 tablet (0.25 mg total) by mouth daily as needed for anxiety. 30 tablet 1  . Azelastine-Fluticasone (DYMISTA) 137-50 MCG/ACT SUSP Place 2 sprays into the nose daily.     [name] b complex vitamins capsule Take 1 capsule by mouth daily.    . Biotin 1000 MCG tablet Take 1,000 mcg by mouth daily.      [name] estradiol (ESTRACE) 0.1 MG/GM vaginal cream Apply every other day for 1 week then 2-3 times per week 42.5 g 8  . Glycopyrrolate-Formoterol (BEVESPI AEROSPHERE) 9-4.8 MCG/ACT AERO Inhale 2 puffs into the lungs 2 (two) times daily. 10.7 g 11  . Magnesium Oxide 250 MG TABS Take 250 mg by mouth daily.     . pantoprazole (PROTONIX) 40 MG tablet Take 1 tablet (40 mg total) by mouth daily. 30 tablet 3  . Probiotic Product  (ALIGN) 4 MG CAPS Take 1 capsule by mouth daily.     . sodium chloride (OCEAN) 0.65 % SOLN nasal spray Place 1 spray into both nostrils as needed for congestion.    . traZODone (DESYREL) 150 MG tablet TAKE ONE (1) TO TWO (2) TABLETS BY MOUTHAT BEDTIME 30 tablet 4  . vitamin C (ASCORBIC ACID) 500 MG tablet Take 500 mg by mouth daily.     No facility-administered medications prior to visit.  Objective:   Physical Exam Vitals:   08/29/16 1612  BP: 114/78  Pulse: 66  SpO2: 98%  Weight: 128 lb (58.1 kg)  Height: 5\' 8"  (1.727 m)   Gen: Pleasant, well-nourished, in no distress,  normal affect  ENT: No lesions,  mouth clear,  oropharynx clear, no postnasal drip  Neck: No JVD, no stridor  Lungs: No use of accessory muscles, clear B   Cardiovascular: RRR, heart sounds normal, no murmur or gallops, no peripheral edema  Musculoskeletal: No deformities, no cyanosis or clubbing  Neuro: alert, non focal  Skin: Warm, no lesions or rashes, very tan      Assessment & Plan:  Obstructive sleep apnea Untreated  Cough Continue Protonix  Asthma Mild obstruction noted on spirometry today. She has been on them S/P in the past and may have benefited. I like to start her on Symbicort for a therapeutic trial. Also albuterol as needed. She carries a possible allergy to this medication unclear this is accurate.  Baltazar Apo, MD, PhD 08/29/2016, 4:50 PM [facility] [phone] or if no answer [phone]

## 2016-08-29 NOTE — Assessment & Plan Note (Signed)
Continue Protonix °

## 2016-08-29 NOTE — Assessment & Plan Note (Signed)
Mild obstruction noted on spirometry today. She has been on them S/P in the past and may have benefited. I like to start her on Symbicort for a therapeutic trial. Also albuterol as needed. She carries a possible allergy to this medication unclear this is accurate.

## 2016-08-29 NOTE — Patient Instructions (Addendum)
Please continue your Protonix once a day.  Please start Symbicort 2 puffs twice a day. Please rinse and gargle after using.  Use albuterol 2 puffs up to every 4 hours if needed for shortness of breath.  Follow with Dr Lamonte Sakai in 4 months or sooner if you have any problems.

## 2016-09-01 ENCOUNTER — Telehealth: Payer: Self-pay | Admitting: Emergency Medicine

## 2016-09-01 NOTE — Telephone Encounter (Signed)
CY  Please Advise-  Spoke with pt and she stated she apologized but she read the warning label for the [facility] and it states to not take if you have asthma. Pt states she now does feel comfortable using this inhaler and is requesting something else

## 2016-09-01 NOTE — Telephone Encounter (Signed)
Ok for her to try her Bevesspi samples instead of Symbicort        Inhale 2 puffs, twice daily

## 2016-09-01 NOTE — Telephone Encounter (Signed)
CY  Please Advise- sick message  Please advise in RB's absence. Pt called in and stated she feels like the Symbicort that she was placed on at there last visit has been making her cough more. She stated back in February her pcp gave her samples of Bevespi but she stated she never used them. She wanted to know should she start this instead or be switched to another inhaler.  Allergies  Allergen Reactions  . Adhesive [Tape] Rash  . Albuterol     heartburn  . Cymbalta [Duloxetine Hcl]     Diarrhea, nausea, anxiety worse, shaky  . Lyrica [Pregabalin] Other (See Comments)    Sedation  . Neurontin [Gabapentin] Other (See Comments)    Sedation  . Nitrofuran Derivatives Other (See Comments)    "tingling"  . Paxil [Paroxetine Hcl]     Bowel upset, tingling  . Prozac [Fluoxetine Hcl] Other (See Comments)    Made patient worse and constipation   Current Outpatient Prescriptions on File Prior to Visit  Medication Sig Dispense Refill  . albuterol (PROVENTIL HFA;VENTOLIN HFA) 108 (90 Base) MCG/ACT inhaler Inhale 2 puffs into the lungs every 6 (six) hours as needed for wheezing or shortness of breath. 1 Inhaler 5  . ALPRAZolam (XANAX) 0.25 MG tablet Take 1 tablet (0.25 mg total) by mouth daily as needed for anxiety. 30 tablet 1  . Azelastine-Fluticasone (DYMISTA) 137-50 MCG/ACT SUSP Place 2 sprays into the nose daily.     [name] b complex vitamins capsule Take 1 capsule by mouth daily.    . Biotin 1000 MCG tablet Take 1,000 mcg by mouth daily.      . budesonide-formoterol (SYMBICORT) 160-4.5 MCG/ACT inhaler Inhale 2 puffs into the lungs 2 (two) times daily. 2 Inhaler 0  . estradiol (ESTRACE) 0.1 MG/GM vaginal cream Apply every other day for 1 week then 2-3 times per week 42.5 g 8  . Glycopyrrolate-Formoterol (BEVESPI AEROSPHERE) 9-4.8 MCG/ACT AERO Inhale 2 puffs into the lungs 2 (two) times daily. 10.7 g 11  . Magnesium Oxide 250 MG TABS Take 250 mg by mouth daily.     . pantoprazole (PROTONIX) 40 MG  tablet Take 1 tablet (40 mg total) by mouth daily. 30 tablet 3  . Probiotic Product (ALIGN) 4 MG CAPS Take 1 capsule by mouth daily.     . sodium chloride (OCEAN) 0.65 % SOLN nasal spray Place 1 spray into both nostrils as needed for congestion.    . traZODone (DESYREL) 150 MG tablet TAKE ONE (1) TO TWO (2) TABLETS BY MOUTHAT BEDTIME 30 tablet 4  . vitamin C (ASCORBIC ACID) 500 MG tablet Take 500 mg by mouth daily.    . [DISCONTINUED] colesevelam (WELCHOL) 625 MG tablet Take 625 mg by mouth 2 (two) times daily with a meal. Take one tablet by mouth twice a day    . [DISCONTINUED] Hyoscyamine-Phenyltoloxamine ([place] NF) [id number] MG CAPS Take one tablet by mouth as needed for abdominal cramping 24 each 0   No current facility-administered medications on file prior to visit.    1. Collene Gobble, MD (Physician) at 08/29/2016 4:45 PM - Signed    Please continue your Protonix once a day.  Please start Symbicort 2 puffs twice a day. Please rinse and gargle after using.  Use albuterol 2 puffs up to every 4 hours if needed for shortness of breath.  Follow with Dr Lamonte Sakai in 4 months or sooner if you have any problems.

## 2016-09-01 NOTE — Telephone Encounter (Signed)
Bevespi still worth a try.  Otherwise- since she wasn't happy with Symbicort, suggest she just use her albuterol rescue inhaler                   2 puffs, twice daily See how this works for her compared with the Symbicort. She can discuss with Dr Lamonte Sakai when he returns.

## 2016-09-01 NOTE — Telephone Encounter (Signed)
Spoke with pt and she does not wish to use the Bevespi and again does not wish to continue on the Symbicort. She understood the instructions on how she is to use her rescue inhaler in the meantime until RB returns. Pt had no further questions at this time.   RB please see previous messages

## 2016-09-05 NOTE — Telephone Encounter (Signed)
RB will be back in the office on 09/08/2016. Message will be addressed then.

## 2016-09-08 NOTE — Telephone Encounter (Signed)
Agree with having her use her albuterol 2 puffs q4h prn wheeze, SOB. Do not use the Symbicort or bevespi for now.

## 2016-09-08 NOTE — Telephone Encounter (Signed)
Spoke with pt and made her aware of RB's message. She understood and had no further questions at this time. Nothing further is needed

## 2016-11-10 ENCOUNTER — Encounter: Payer: Self-pay | Admitting: Obstetrics & Gynecology

## 2016-11-10 ENCOUNTER — Ambulatory Visit (INDEPENDENT_AMBULATORY_CARE_PROVIDER_SITE_OTHER): Payer: 59 | Admitting: Obstetrics & Gynecology

## 2016-11-10 VITALS — BP 128/86 | Ht 68.0 in | Wt 139.0 lb

## 2016-11-10 DIAGNOSIS — T887XXA Unspecified adverse effect of drug or medicament, initial encounter: Secondary | ICD-10-CM | POA: Insufficient documentation

## 2016-11-10 DIAGNOSIS — Z90710 Acquired absence of both cervix and uterus: Secondary | ICD-10-CM

## 2016-11-10 DIAGNOSIS — Z1272 Encounter for screening for malignant neoplasm of vagina: Secondary | ICD-10-CM | POA: Diagnosis not present

## 2016-11-10 DIAGNOSIS — Z01411 Encounter for gynecological examination (general) (routine) with abnormal findings: Secondary | ICD-10-CM | POA: Diagnosis not present

## 2016-11-10 DIAGNOSIS — Z780 Asymptomatic menopausal state: Secondary | ICD-10-CM | POA: Diagnosis not present

## 2016-11-10 MED ORDER — ESTRADIOL 0.1 MG/GM VA CREA: TOPICAL_CREAM | VAGINAL | 8 refills | Status: DC

## 2016-11-10 NOTE — Progress Notes (Signed)
[name] [facility] 1945-07-04 [id number]   History:    71 y.o. G2P2 married.  RP:  Established patient presenting for annual gyn exam   HPI:  Menopause.  No HRT.  No PMB.  S/P Hysterectomy.  Coital discomfort improved with Estradiol cream.  No pelvic pain.  Breasts wnl.  Mictions/BMs wnl.  Treated with Prednisone and ABTx for Bronchitis 08/2016.  Gained weight, about 13 Lbs, and still has a general feeling of being swollen.  BMI 21.13.  Mictions/BMs wnl.  Very active physically.  Past medical history,surgical history, family history and social history were all reviewed and documented in the EPIC chart.  Gynecologic History No LMP recorded. Patient has had a hysterectomy. Contraception: status post hysterectomy Last Pap: 2013. Results were: normal Last mammogram: 2018. Results were: normal Bone Density 2017 Colon 2012  Obstetric History OB History  Gravida Para Term Preterm AB Living  2 2       2   SAB TAB Ectopic Multiple Live Births               # Outcome Date GA Lbr Len/2nd Weight Sex Delivery Anes PTL Lv  2 Para           1 Para                ROS: A ROS was performed and pertinent positives and negatives are included in the history.  GENERAL: No fevers or chills. HEENT: No change in vision, no earache, sore throat or sinus congestion. NECK: No pain or stiffness. CARDIOVASCULAR: No chest pain or pressure. No palpitations. PULMONARY: No shortness of breath, cough or wheeze. GASTROINTESTINAL: No abdominal pain, nausea, vomiting or diarrhea, melena or bright red blood per rectum. GENITOURINARY: No urinary frequency, urgency, hesitancy or dysuria. MUSCULOSKELETAL: No joint or muscle pain, no back pain, no recent trauma. DERMATOLOGIC: No rash, no itching, no lesions. ENDOCRINE: No polyuria, polydipsia, no heat or cold intolerance. No recent change in weight. HEMATOLOGICAL: No anemia or easy bruising or bleeding. NEUROLOGIC: No headache, seizures, numbness, tingling or  weakness. PSYCHIATRIC: No depression, no loss of interest in normal activity or change in sleep pattern.     Exam:   BP 128/86   Ht 5\' 8"  (1.727 m)   Wt 139 lb (63 kg)   BMI 21.13 kg/m   Body mass index is 21.13 kg/m.  General appearance : Well developed well nourished female. No acute distress HEENT: Eyes: no retinal hemorrhage or exudates,  Neck supple, trachea midline, no carotid bruits, no thyroidmegaly Lungs: Clear to auscultation, no rhonchi or wheezes, or rib retractions  Heart: Regular rate and rhythm, no murmurs or gallops Breast:Examined in sitting and supine position were symmetrical in appearance, no palpable masses or tenderness,  no skin retraction, no nipple inversion, no nipple discharge, no skin discoloration, no axillary or supraclavicular lymphadenopathy Abdomen: no palpable masses or tenderness, no rebound or guarding Extremities: no edema or skin discoloration or tenderness  Pelvic: Vulva normal  Bartholin, Urethra, Skene Glands: Within normal limits             Vagina: No gross lesions or discharge.  Pap reflex done.  Uterus/Cervix absent  Adnexa  Without masses or tenderness  Anus and perineum  normal    Assessment/Plan:  71 y.o. female for annual exam   1. Encounter for gynecological examination with abnormal finding Gyn exam s/p Hyst.  Pap reflex done.  Breasts wnl.  Screening Mammo neg 2018.  2. H/O total hysterectomy  3. Menopause present No HRT except Estradiol cream vaginally and on vulva twice a week.  Improving Dyspareunia.  Represcribed.  Vit D recommended.  Ca++ in nutrition.  Weight bearing physical activity.  4. Special screening for malignant neoplasms, vagina  - Pap IG w/ reflex to HPV when ASC-U  5. Side effect of medication Prednisone with swoliness and weight gain.  Currently feels swollen, but on exam, no evidence of oedema.  Reassured and encouraged to continue to stay fit.  Will consult with her Fam MD if no improvement in her  sensation of being swollen.  Counseling on above issues >50% x 15 minutes.  [name] MD, 11:20 AM 11/10/2016

## 2016-11-10 NOTE — Patient Instructions (Signed)
1. Encounter for gynecological examination with abnormal finding Gyn exam s/p Hyst.  Pap reflex done.  Breasts wnl.  Screening Mammo neg 2018.  2. H/O total hysterectomy   3. Menopause present No HRT except Estradiol cream vaginally and on vulva twice a week.  Improving Dyspareunia.  Represcribed.  Vit D recommended.  Ca++ in nutrition.  Weight bearing physical activity.  4. Special screening for malignant neoplasms, vagina  - Pap IG w/ reflex to HPV when ASC-U  5. Side effect of medication Prednisone with swolliness and weight gain.  Currently feels swollen, but on exam, no evidence of oedema.  Reassured and encouraged to continue to stay fit.  Will consult with her Fam MD if no improvement in her sensation of being swollen.  [name], it was a pleasure to meet you today!  I will inform you of your results as soon as available.   Health Maintenance for Postmenopausal Women Menopause is a normal process in which your reproductive ability comes to an end. This process happens gradually over a span of months to years, usually between the ages of 42 and 63. Menopause is complete when you have missed 12 consecutive menstrual periods. It is important to talk with your health care provider about some of the most common conditions that affect postmenopausal women, such as heart disease, cancer, and bone loss (osteoporosis). Adopting a healthy lifestyle and getting preventive care can help to promote your health and wellness. Those actions can also lower your chances of developing some of these common conditions. What should I know about menopause? During menopause, you may experience a number of symptoms, such as:  Moderate-to-severe hot flashes.  Night sweats.  Decrease in sex drive.  Mood swings.  Headaches.  Tiredness.  Irritability.  Memory problems.  Insomnia.  Choosing to treat or not to treat menopausal changes is an individual decision that you make with your health care  provider. What should I know about hormone replacement therapy and supplements? Hormone therapy products are effective for treating symptoms that are associated with menopause, such as hot flashes and night sweats. Hormone replacement carries certain risks, especially as you become older. If you are thinking about using estrogen or estrogen with progestin treatments, discuss the benefits and risks with your health care provider. What should I know about heart disease and stroke? Heart disease, heart attack, and stroke become more likely as you age. This may be due, in part, to the hormonal changes that your body experiences during menopause. These can affect how your body processes dietary fats, triglycerides, and cholesterol. Heart attack and stroke are both medical emergencies. There are many things that you can do to help prevent heart disease and stroke:  Have your blood pressure checked at least every 1-2 years. High blood pressure causes heart disease and increases the risk of stroke.  If you are 39-41 years old, ask your health care provider if you should take aspirin to prevent a heart attack or a stroke.  Do not use any tobacco products, including cigarettes, chewing tobacco, or electronic cigarettes. If you need help quitting, ask your health care provider.  It is important to eat a healthy diet and maintain a healthy weight. ? Be sure to include plenty of vegetables, fruits, low-fat dairy products, and lean protein. ? Avoid eating foods that are high in solid fats, added sugars, or salt (sodium).  Get regular exercise. This is one of the most important things that you can do for your health. ? Try  to exercise for at least 150 minutes each week. The type of exercise that you do should increase your heart rate and make you sweat. This is known as moderate-intensity exercise. ? Try to do strengthening exercises at least twice each week. Do these in addition to the moderate-intensity  exercise.  Know your numbers.Ask your health care provider to check your cholesterol and your blood glucose. Continue to have your blood tested as directed by your health care provider.  What should I know about cancer screening? There are several types of cancer. Take the following steps to reduce your risk and to catch any cancer development as early as possible. Breast Cancer  Practice breast self-awareness. ? This means understanding how your breasts normally appear and feel. ? It also means doing regular breast self-exams. Let your health care provider know about any changes, no matter how small.  If you are 58 or older, have a clinician do a breast exam (clinical breast exam or CBE) every year. Depending on your age, family history, and medical history, it may be recommended that you also have a yearly breast X-ray (mammogram).  If you have a family history of breast cancer, talk with your health care provider about genetic screening.  If you are at high risk for breast cancer, talk with your health care provider about having an MRI and a mammogram every year.  Breast cancer (BRCA) gene test is recommended for women who have family members with BRCA-related cancers. Results of the assessment will determine the need for genetic counseling and BRCA1 and for BRCA2 testing. BRCA-related cancers include these types: ? Breast. This occurs in males or females. ? Ovarian. ? Tubal. This may also be called fallopian tube cancer. ? Cancer of the abdominal or pelvic lining (peritoneal cancer). ? Prostate. ? Pancreatic.  Cervical, Uterine, and Ovarian Cancer Your health care provider may recommend that you be screened regularly for cancer of the pelvic organs. These include your ovaries, uterus, and vagina. This screening involves a pelvic exam, which includes checking for microscopic changes to the surface of your cervix (Pap test).  For women ages 21-65, health care providers may recommend a  pelvic exam and a Pap test every three years. For women ages 18-65, they may recommend the Pap test and pelvic exam, combined with testing for human papilloma virus (HPV), every five years. Some types of HPV increase your risk of cervical cancer. Testing for HPV may also be done on women of any age who have unclear Pap test results.  Other health care providers may not recommend any screening for nonpregnant women who are considered low risk for pelvic cancer and have no symptoms. Ask your health care provider if a screening pelvic exam is right for you.  If you have had past treatment for cervical cancer or a condition that could lead to cancer, you need Pap tests and screening for cancer for at least 20 years after your treatment. If Pap tests have been discontinued for you, your risk factors (such as having a new sexual partner) need to be reassessed to determine if you should start having screenings again. Some women have medical problems that increase the chance of getting cervical cancer. In these cases, your health care provider may recommend that you have screening and Pap tests more often.  If you have a family history of uterine cancer or ovarian cancer, talk with your health care provider about genetic screening.  If you have vaginal bleeding after reaching menopause, tell  your health care provider.  There are currently no reliable tests available to screen for ovarian cancer.  Lung Cancer Lung cancer screening is recommended for adults 21-43 years old who are at high risk for lung cancer because of a history of smoking. A yearly low-dose CT scan of the lungs is recommended if you:  Currently smoke.  Have a history of at least 30 pack-years of smoking and you currently smoke or have quit within the past 15 years. A pack-year is smoking an average of one pack of cigarettes per day for one year.  Yearly screening should:  Continue until it has been 15 years since you quit.  Stop if  you develop a health problem that would prevent you from having lung cancer treatment.  Colorectal Cancer  This type of cancer can be detected and can often be prevented.  Routine colorectal cancer screening usually begins at age 74 and continues through age 80.  If you have risk factors for colon cancer, your health care provider may recommend that you be screened at an earlier age.  If you have a family history of colorectal cancer, talk with your health care provider about genetic screening.  Your health care provider may also recommend using home test kits to check for hidden blood in your stool.  A small camera at the end of a tube can be used to examine your colon directly (sigmoidoscopy or colonoscopy). This is done to check for the earliest forms of colorectal cancer.  Direct examination of the colon should be repeated every 5-10 years until age 59. However, if early forms of precancerous polyps or small growths are found or if you have a family history or genetic risk for colorectal cancer, you may need to be screened more often.  Skin Cancer  Check your skin from head to toe regularly.  Monitor any moles. Be sure to tell your health care provider: ? About any new moles or changes in moles, especially if there is a change in a mole's shape or color. ? If you have a mole that is larger than the size of a pencil eraser.  If any of your family members has a history of skin cancer, especially at a young age, talk with your health care provider about genetic screening.  Always use sunscreen. Apply sunscreen liberally and repeatedly throughout the day.  Whenever you are outside, protect yourself by wearing long sleeves, pants, a wide-brimmed hat, and sunglasses.  What should I know about osteoporosis? Osteoporosis is a condition in which bone destruction happens more quickly than new bone creation. After menopause, you may be at an increased risk for osteoporosis. To help prevent  osteoporosis or the bone fractures that can happen because of osteoporosis, the following is recommended:  If you are 51-55 years old, get at least 1,000 mg of calcium and at least 600 mg of vitamin D per day.  If you are older than age 31 but younger than age 83, get at least 1,200 mg of calcium and at least 600 mg of vitamin D per day.  If you are older than age 30, get at least 1,200 mg of calcium and at least 800 mg of vitamin D per day.  Smoking and excessive alcohol intake increase the risk of osteoporosis. Eat foods that are rich in calcium and vitamin D, and do weight-bearing exercises several times each week as directed by your health care provider. What should I know about how menopause affects my mental health?  Depression may occur at any age, but it is more common as you become older. Common symptoms of depression include:  Low or sad mood.  Changes in sleep patterns.  Changes in appetite or eating patterns.  Feeling an overall lack of motivation or enjoyment of activities that you previously enjoyed.  Frequent crying spells.  Talk with your health care provider if you think that you are experiencing depression. What should I know about immunizations? It is important that you get and maintain your immunizations. These include:  Tetanus, diphtheria, and pertussis (Tdap) booster vaccine.  Influenza every year before the flu season begins.  Pneumonia vaccine.  Shingles vaccine.  Your health care provider may also recommend other immunizations. This information is not intended to replace advice given to you by your health care provider. Make sure you discuss any questions you have with your health care provider. Document Released: 04/11/2005 Document Revised: 09/07/2015 Document Reviewed: 11/21/2014 Elsevier Interactive Patient Education  2018 [employer].

## 2016-11-12 LAB — PAP IG W/ RFLX HPV ASCU

## 2016-12-15 NOTE — Progress Notes (Signed)
Subjective:    Patient ID: [name], female    DOB: June 10, 1945, 71 y.o.   MRN: [id number]  HPI She is here for a physical exam.   She fell off the deck in June.  She saw ortho and was put on prednisone and she gained a lot of weight.  She has lost some of the weight, but her weight varies drastically within one day and she does not understand why.    She is walking 45 minutes - 1 hour most days.   She feels like she retains fluid in her hands and ankles periodically.    Medications and allergies reviewed with patient and updated if appropriate.  Patient Active Problem List   Diagnosis Date Noted  . Side effect of medication 11/10/2016  . Chest discomfort 05/07/2016  . Back pain 05/07/2016  . Asthma 04/22/2016  . Acute bronchitis 04/01/2016  . Family history of diabetes mellitus (DM) 12/07/2015  . Constipation due to outlet dysfunction   . Obstructive sleep apnea 07/12/2015  . Seasonal and perennial allergic rhinitis 07/12/2015  . Anemia 12/05/2014  . Vitamin D deficiency 12/05/2014  . Sinusitis, chronic 05/30/2014    Class: Chronic  . Vaginal atrophy 10/20/2012  . Insomnia 10/20/2012  . Orthostatic hypotension 02/16/2012  . Cough 02/13/2011  . Palpitations 12/31/2010  . GERD (gastroesophageal reflux disease) 07/25/2010  . Depression with anxiety 07/25/2010  . Lymphocytic colitis 06/14/2010  . Gastroesophageal reflux disease with hiatal hernia   . BACK PAIN, CHRONIC 12/27/2009  . Osteopenia 12/27/2009  . 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 SOB (shortness of breath) 06/25/2009  . Hereditary and idiopathic peripheral neuropathy 12/22/2007  . Hyperlipidemia 03/09/2007  . Generalized anxiety disorder 03/09/2007  . MIGRAINE HEADACHE 03/09/2007  . IBS 03/09/2007  . SPONDYLOSIS 03/09/2007  . Myalgia and myositis 03/09/2007  . SCHATZKI'S RING, HX OF 03/09/2007  . Diaphragmatic hernia 12/30/2006  . GASTRITIS 02/17/2005  .  DIVERTICULOSIS OF COLON 01/01/2000    Current Outpatient Prescriptions on File Prior to Visit  Medication Sig Dispense Refill  . albuterol (PROVENTIL HFA;VENTOLIN HFA) 108 (90 Base) MCG/ACT inhaler Inhale 2 puffs into the lungs every 6 (six) hours as needed for wheezing or shortness of breath. 1 Inhaler 5  . ALPRAZolam (XANAX) 0.25 MG tablet Take 1 tablet (0.25 mg total) by mouth daily as needed for anxiety. 30 tablet 1  . Azelastine-Fluticasone (DYMISTA) 137-50 MCG/ACT SUSP Place 2 sprays into the nose daily.     [name] b complex vitamins capsule Take 1 capsule by mouth daily.    . Biotin 1000 MCG tablet Take 1,000 mcg by mouth daily.      . budesonide-formoterol (SYMBICORT) 160-4.5 MCG/ACT inhaler Inhale 2 puffs into the lungs 2 (two) times daily. 2 Inhaler 0  . estradiol (ESTRACE) 0.1 MG/GM vaginal cream Apply a 1/4 of an applicator vaginally and a thin layer on the vulva 2 times per week. 42.5 g 8  . Glycopyrrolate-Formoterol (BEVESPI AEROSPHERE) 9-4.8 MCG/ACT AERO Inhale 2 puffs into the lungs 2 (two) times daily. 10.7 g 11  . Magnesium Oxide 250 MG TABS Take 250 mg by mouth daily.     . pantoprazole (PROTONIX) 40 MG tablet Take 1 tablet (40 mg total) by mouth daily. 30 tablet 3  . Probiotic Product (ALIGN) 4 MG CAPS Take 1 capsule by mouth daily.     . vitamin C (ASCORBIC ACID) 500 MG tablet Take 500 mg by mouth daily.    . [  DISCONTINUED] colesevelam (WELCHOL) 625 MG tablet Take 625 mg by mouth 2 (two) times daily with a meal. Take one tablet by mouth twice a day    . [DISCONTINUED] Hyoscyamine-Phenyltoloxamine (Zap NF) [id number] MG CAPS Take one tablet by mouth as needed for abdominal cramping 24 each 0   No current facility-administered medications on file prior to visit.     Past Medical History:  Diagnosis Date  . Anxiety   . Complication of anesthesia   . Depression    generalized anxiety disorder  . Diverticulosis   . Duodenal diverticulum   . Family history of adverse  reaction to anesthesia    Mother and Daughters- N/V  . Fibromyalgia   . Gastroesophageal reflux disease with hiatal hernia   . Hiatal hernia   . History of kidney stones   . Hyperlipidemia   . IBS (irritable bowel syndrome)   . Lymphocytic colitis    Dr Sharlett Iles  . Migraine headache   . Mitral valve prolapse   . Neuropathy   . Osteopenia    BMD ordered by GYN  . Peripheral neuropathy    treated as RLS by  Neurology  . PONV (postoperative nausea and vomiting)   . Restless leg syndrome   . Schatzki's ring    History of  . Sleep apnea    On CPAP, has not been using  . Spondylosis     Past Surgical History:  Procedure Laterality Date  . ANAL RECTAL MANOMETRY N/A 11/14/2015   Procedure: ANO RECTAL MANOMETRY;  Surgeon: Gatha Mayer, MD;  Location: [facility];  Service: Endoscopy;  Laterality: N/A;  . BREAST ENHANCEMENT SURGERY    . CATARACT EXTRACTION Bilateral   . cataract surgery Left    Dr Bing Plume  . CHOLECYSTECTOMY    . COLONOSCOPY    . ESOPHAGEAL DILATION     X 2  . ROTATOR CUFF REPAIR Left   . SINUS ENDO W/FUSION N/A 05/30/2014   Procedure: REVISION  FRONTAL SINUS SURGERY WITH FUSION SCAN;  Surgeon: Jerrell Belfast, MD;  Location: [facility];  Service: ENT;  Laterality: N/A;  . SINUS SURGERY WITH INSTATRAK     x 2  . TUBAL LIGATION    . Vaginal cystectomy     x 2  . VAGINAL HYSTERECTOMY  05/2007   Vaginal repair, Dr Ubaldo Glassing.  Partial  hysterectomy.    Social History   Social History  . Marital status: Married    Spouse name: N/A  . Number of children: 2  . Years of education: 64   Occupational History  . retired [employer]   Social History Main Topics  . Smoking status: Former Smoker    Packs/day: 0.50    Years: 15.00    Types: Cigarettes    Quit date: 03/03/1998  . Smokeless tobacco: Never Used     Comment: smoked 1973- 2006, up to <  1  ppd  . Alcohol use No  . Drug use: No  . Sexual activity: Yes    Partners: Male     Comment: 1st intercourse- 45,  partners- 2, married- 87 yrs    Other Topics Concern  . None   Social History Narrative   HAS REGULAR EXERCISE   DAILY CAFFEINE: 1-2 CUPS   Patient is right handed.    Family History  Problem Relation Age of Onset  . Hypertension Father 70  . Stroke Father 25  . Heart attack Father         ?  in 53s  . Hypertension Mother   . Neuropathy Mother   . Anemia Mother   . Coronary artery disease Brother        Stent placement in 60s  . Diabetes Maternal Uncle   . Heart attack Paternal [place]el [place] , ? age  . Colon cancer Neg Hx   . Seizures Neg Hx     Review of Systems  Constitutional: Negative for chills and fever.  Eyes: Negative for visual disturbance.  Respiratory: Positive for cough (occasional) and chest tightness. Negative for shortness of breath and wheezing.   Cardiovascular: Positive for chest pain (occasional), palpitations and leg swelling (intermittent leg and hand swelling/ fluid retention).  Gastrointestinal: Negative for abdominal pain, blood in stool, constipation, diarrhea and nausea.  Genitourinary: Negative for dysuria and hematuria.  Musculoskeletal: Positive for back pain (lower back) and neck pain.  Skin: Negative for color change and rash.  Neurological: Positive for light-headedness and numbness (feet b/l - neuropathy). Negative for headaches.  Psychiatric/Behavioral: Negative for dysphoric mood. The patient is nervous/anxious (intermittent).        Objective:   Vitals:   12/16/16 0912  BP: 110/70  Pulse: 62  Resp: 16  Temp: 97.7 F (36.5 C)  SpO2: 98%   Filed Weights   12/16/16 0912  Weight: 134 lb (60.8 kg)   Body mass index is 20.37 kg/m.  Wt Readings from Last 3 Encounters:  12/16/16 134 lb (60.8 kg)  11/10/16 139 lb (63 kg)  08/29/16 128 lb (58.1 kg)     Physical Exam Constitutional: She appears well-developed and well-nourished. No distress.  HENT:  Head: Normocephalic and atraumatic.  Right Ear: External ear normal.  Normal ear canal and TM Left Ear: External ear normal.  Normal ear canal and TM Mouth/Throat: Oropharynx is clear and moist.  Eyes: Conjunctivae and EOM are normal.  Neck: Neck supple. No tracheal deviation present. No thyromegaly present.  No carotid bruit  Cardiovascular: Normal rate, regular rhythm and normal heart sounds.   No murmur heard.  No edema. Pulmonary/Chest: Effort normal and breath sounds normal. No respiratory distress. She has no wheezes. She has no rales.  Breast: deferred to Gyn Abdominal: Soft. She exhibits no distension. There is no tenderness.  Lymphadenopathy: She has no cervical adenopathy.  Skin: Skin is warm and dry. She is not diaphoretic.  Psychiatric: She has a normal mood and affect. Her behavior is normal.        Assessment & Plan:   Physical exam: Screening blood work  ordered Immunizations   Td due, flu today, discussed shingrix Colonoscopy  Up to date  Mammogram   Up to date  Gyn   Up to date  Dexa    Up to date  Eye exams   Up to date  EKG   Last done 12/2015 Exercise  Walking regularly Weight   Weight is good Skin no concerns, sees derm  Substance abuse    none  See Problem List for Assessment and Plan of chronic medical problems.

## 2016-12-16 ENCOUNTER — Other Ambulatory Visit (INDEPENDENT_AMBULATORY_CARE_PROVIDER_SITE_OTHER): Payer: 59

## 2016-12-16 ENCOUNTER — Encounter: Payer: Self-pay | Admitting: Internal Medicine

## 2016-12-16 ENCOUNTER — Ambulatory Visit (INDEPENDENT_AMBULATORY_CARE_PROVIDER_SITE_OTHER): Payer: 59 | Admitting: Internal Medicine

## 2016-12-16 ENCOUNTER — Telehealth: Payer: Self-pay | Admitting: Internal Medicine

## 2016-12-16 VITALS — BP 110/70 | HR 62 | Temp 97.7°F | Resp 16 | Ht 68.0 in | Wt 134.0 lb

## 2016-12-16 DIAGNOSIS — E538 Deficiency of other specified B group vitamins: Secondary | ICD-10-CM | POA: Diagnosis not present

## 2016-12-16 DIAGNOSIS — Z23 Encounter for immunization: Secondary | ICD-10-CM

## 2016-12-16 DIAGNOSIS — F411 Generalized anxiety disorder: Secondary | ICD-10-CM | POA: Diagnosis not present

## 2016-12-16 DIAGNOSIS — K219 Gastro-esophageal reflux disease without esophagitis: Secondary | ICD-10-CM | POA: Diagnosis not present

## 2016-12-16 DIAGNOSIS — E559 Vitamin D deficiency, unspecified: Secondary | ICD-10-CM | POA: Diagnosis not present

## 2016-12-16 DIAGNOSIS — Z0000 Encounter for general adult medical examination without abnormal findings: Secondary | ICD-10-CM

## 2016-12-16 LAB — COMPREHENSIVE METABOLIC PANEL
ALT: 26 U/L (ref 0–35)
AST: 32 U/L (ref 0–37)
Albumin: 4.2 g/dL (ref 3.5–5.2)
Alkaline Phosphatase: 70 U/L (ref 39–117)
BILIRUBIN TOTAL: 0.7 mg/dL (ref 0.2–1.2)
BUN: 12 mg/dL (ref 6–23)
CO2: 28 meq/L (ref 19–32)
CREATININE: 0.61 mg/dL (ref 0.40–1.20)
Calcium: 9.5 mg/dL (ref 8.4–10.5)
Chloride: 105 mEq/L (ref 96–112)
GFR: 102.24 mL/min (ref >60.00)
GLUCOSE: 85 mg/dL (ref 70–99)
Potassium: 3.7 mEq/L (ref 3.5–5.1)
Sodium: 141 mEq/L (ref 135–145)
Total Protein: 6.7 g/dL (ref 6.0–8.3)

## 2016-12-16 LAB — VITAMIN B12: Vitamin B-12: 272 pg/mL (ref 211–911)

## 2016-12-16 LAB — CBC WITH DIFFERENTIAL/PLATELET
BASOS ABS: 0.1 10*3/uL (ref 0.0–0.1)
BASOS PCT: 1.1 % (ref 0.0–3.0)
Eosinophils Absolute: 0.1 10*3/uL (ref 0.0–0.7)
Eosinophils Relative: 1.6 % (ref 0.0–5.0)
HCT: 39.9 % (ref 36.0–46.0)
Hemoglobin: 13 g/dL (ref 12.0–15.0)
LYMPHS ABS: 1.5 10*3/uL (ref 0.7–4.0)
Lymphocytes Relative: 27.1 % (ref 12.0–46.0)
MCHC: 32.5 g/dL (ref 30.0–36.0)
MCV: 92.1 fl (ref 78.0–100.0)
MONOS PCT: 8 % (ref 3.0–12.0)
Monocytes Absolute: 0.4 10*3/uL (ref 0.1–1.0)
NEUTROS ABS: 3.5 10*3/uL (ref 1.4–7.7)
NEUTROS PCT: 62.2 % (ref 43.0–77.0)
PLATELETS: 214 10*3/uL (ref 150.0–400.0)
RBC: 4.34 Mil/uL (ref 3.87–5.11)
RDW: 14.6 % (ref 11.5–15.5)
WBC: 5.6 10*3/uL (ref 4.0–10.5)

## 2016-12-16 LAB — LIPID PANEL
Cholesterol: 229 mg/dL — ABNORMAL HIGH (ref 0–200)
HDL: 67.6 mg/dL (ref >39.00)
LDL Cholesterol: 149 mg/dL — ABNORMAL HIGH (ref 0–99)
NONHDL: 161.46
Total CHOL/HDL Ratio: 3
Triglycerides: 60 mg/dL (ref 0.0–149.0)
VLDL: 12 mg/dL (ref 0.0–40.0)

## 2016-12-16 LAB — HEMOGLOBIN A1C: HEMOGLOBIN A1C: 5.8 % (ref 4.6–6.5)

## 2016-12-16 LAB — TSH: TSH: 2.68 u[IU]/mL (ref 0.35–4.50)

## 2016-12-16 LAB — VITAMIN D 25 HYDROXY (VIT D DEFICIENCY, FRACTURES): VITD: 32.3 ng/mL (ref 30.00–100.00)

## 2016-12-16 MED ORDER — ALPRAZOLAM 0.25 MG PO TABS: 0.2500 mg | ORAL_TABLET | Freq: Every day | ORAL | 1 refills | Status: DC | PRN

## 2016-12-16 MED ORDER — PANTOPRAZOLE SODIUM 40 MG PO TBEC: 40.0000 mg | DELAYED_RELEASE_TABLET | Freq: Every day | ORAL | 3 refills | Status: DC | PRN

## 2016-12-16 NOTE — Telephone Encounter (Signed)
Patient has been added to shingrix waitlist 

## 2016-12-16 NOTE — Assessment & Plan Note (Signed)
Taking xanax as needed - does not take often  Will continue Refilled today

## 2016-12-16 NOTE — Assessment & Plan Note (Signed)
Taking daily Will check level

## 2016-12-16 NOTE — Patient Instructions (Addendum)
Test(s) ordered today. Your results will be released to MyChart (or called to you) after review, usually within 72hours after test completion. If any changes need to be made, you will be notified at that same time.  All other Health Maintenance issues reviewed.   All recommended immunizations and age-appropriate screenings are up-to-date or discussed.  Flu immunization administered today.   Medications reviewed and updated.  No changes recommended at this time.  Your prescription(s) have been submitted to your pharmacy. Please take as directed and contact our office if you believe you are having problem(s) with the medication(s).  Please followup in one year    Health Maintenance, Female Adopting a healthy lifestyle and getting preventive care can go a long way to promote health and wellness. Talk with your health care provider about what schedule of regular examinations is right for you. This is a good chance for you to check in with your provider about disease prevention and staying healthy. In between checkups, there are plenty of things you can do on your own. Experts have done a lot of research about which lifestyle changes and preventive measures are most likely to keep you healthy. Ask your health care provider for more information. Weight and diet Eat a healthy diet  Be sure to include plenty of vegetables, fruits, low-fat dairy products, and lean protein.  Do not eat a lot of foods high in solid fats, added sugars, or salt.  Get regular exercise. This is one of the most important things you can do for your health. ? Most adults should exercise for at least 150 minutes each week. The exercise should increase your heart rate and make you sweat (moderate-intensity exercise). ? Most adults should also do strengthening exercises at least twice a week. This is in addition to the moderate-intensity exercise.  Maintain a healthy weight  Body mass index (BMI) is a measurement that can  be used to identify possible weight problems. It estimates body fat based on height and weight. Your health care provider can help determine your BMI and help you achieve or maintain a healthy weight.  For females 20 years of age and older: ? A BMI below 18.5 is considered underweight. ? A BMI of 18.5 to 24.9 is normal. ? A BMI of 25 to 29.9 is considered overweight. ? A BMI of 30 and above is considered obese.  Watch levels of cholesterol and blood lipids  You should start having your blood tested for lipids and cholesterol at 73 years of age, then have this test every 5 years.  You may need to have your cholesterol levels checked more often if: ? Your lipid or cholesterol levels are high. ? You are older than 73 years of age. ? You are at high risk for heart disease.  Cancer screening Lung Cancer  Lung cancer screening is recommended for adults 55-80 years old who are at high risk for lung cancer because of a history of smoking.  A yearly low-dose CT scan of the lungs is recommended for people who: ? Currently smoke. ? Have quit within the past 15 years. ? Have at least a 30-pack-year history of smoking. A pack year is smoking an average of one pack of cigarettes a day for 1 year.  Yearly screening should continue until it has been 15 years since you quit.  Yearly screening should stop if you develop a health problem that would prevent you from having lung cancer treatment.  Breast Cancer  Practice breast self-awareness.   This means understanding how your breasts normally appear and feel.  It also means doing regular breast self-exams. Let your health care provider know about any changes, no matter how small.  If you are in your 20s or 30s, you should have a clinical breast exam (CBE) by a health care provider every 1-3 years as part of a regular health exam.  If you are 29 or older, have a CBE every year. Also consider having a breast X-ray (mammogram) every year.  If you  have a family history of breast cancer, talk to your health care provider about genetic screening.  If you are at high risk for breast cancer, talk to your health care provider about having an MRI and a mammogram every year.  Breast cancer gene (BRCA) assessment is recommended for women who have family members with BRCA-related cancers. BRCA-related cancers include: ? Breast. ? Ovarian. ? Tubal. ? Peritoneal cancers.  Results of the assessment will determine the need for genetic counseling and BRCA1 and BRCA2 testing.  Cervical Cancer Your health care provider may recommend that you be screened regularly for cancer of the pelvic organs (ovaries, uterus, and vagina). This screening involves a pelvic examination, including checking for microscopic changes to the surface of your cervix (Pap test). You may be encouraged to have this screening done every 3 years, beginning at age 40.  For women ages 44-65, health care providers may recommend pelvic exams and Pap testing every 3 years, or they may recommend the Pap and pelvic exam, combined with testing for human papilloma virus (HPV), every 5 years. Some types of HPV increase your risk of cervical cancer. Testing for HPV may also be done on women of any age with unclear Pap test results.  Other health care providers may not recommend any screening for nonpregnant women who are considered low risk for pelvic cancer and who do not have symptoms. Ask your health care provider if a screening pelvic exam is right for you.  If you have had past treatment for cervical cancer or a condition that could lead to cancer, you need Pap tests and screening for cancer for at least 20 years after your treatment. If Pap tests have been discontinued, your risk factors (such as having a new sexual partner) need to be reassessed to determine if screening should resume. Some women have medical problems that increase the chance of getting cervical cancer. In these cases,  your health care provider may recommend more frequent screening and Pap tests.  Colorectal Cancer  This type of cancer can be detected and often prevented.  Routine colorectal cancer screening usually begins at 73 years of age and continues through 73 years of age.  Your health care provider may recommend screening at an earlier age if you have risk factors for colon cancer.  Your health care provider may also recommend using home test kits to check for hidden blood in the stool.  A small camera at the end of a tube can be used to examine your colon directly (sigmoidoscopy or colonoscopy). This is done to check for the earliest forms of colorectal cancer.  Routine screening usually begins at age 96.  Direct examination of the colon should be repeated every 5-10 years through 73 years of age. However, you may need to be screened more often if early forms of precancerous polyps or small growths are found.  Skin Cancer  Check your skin from head to toe regularly.  Tell your health care provider about any  new moles or changes in moles, especially if there is a change in a mole's shape or color.  Also tell your health care provider if you have a mole that is larger than the size of a pencil eraser.  Always use sunscreen. Apply sunscreen liberally and repeatedly throughout the day.  Protect yourself by wearing long sleeves, pants, a wide-brimmed hat, and sunglasses whenever you are outside.  Heart disease, diabetes, and high blood pressure  High blood pressure causes heart disease and increases the risk of stroke. High blood pressure is more likely to develop in: ? People who have blood pressure in the high end of the normal range (130-139/85-89 mm Hg). ? People who are overweight or obese. ? People who are African American.  If you are 58-1 years of age, have your blood pressure checked every 3-5 years. If you are 52 years of age or older, have your blood pressure checked every year.  You should have your blood pressure measured twice-once when you are at a hospital or clinic, and once when you are not at a hospital or clinic. Record the average of the two measurements. To check your blood pressure when you are not at a hospital or clinic, you can use: ? An automated blood pressure machine at a pharmacy. ? A home blood pressure monitor.  If you are between 24 years and 65 years old, ask your health care provider if you should take aspirin to prevent strokes.  Have regular diabetes screenings. This involves taking a blood sample to check your fasting blood sugar level. ? If you are at a normal weight and have a low risk for diabetes, have this test once every three years after 73 years of age. ? If you are overweight and have a high risk for diabetes, consider being tested at a younger age or more often. Preventing infection Hepatitis B  If you have a higher risk for hepatitis B, you should be screened for this virus. You are considered at high risk for hepatitis B if: ? You were born in a country where hepatitis B is common. Ask your health care provider which countries are considered high risk. ? Your parents were born in a high-risk country, and you have not been immunized against hepatitis B (hepatitis B vaccine). ? You have HIV or AIDS. ? You use needles to inject street drugs. ? You live with someone who has hepatitis B. ? You have had sex with someone who has hepatitis B. ? You get hemodialysis treatment. ? You take certain medicines for conditions, including cancer, organ transplantation, and autoimmune conditions.  Hepatitis C  Blood testing is recommended for: ? Everyone born from [birth year] through 1967. ? Anyone with known risk factors for hepatitis C.  Sexually transmitted infections (STIs)  You should be screened for sexually transmitted infections (STIs) including gonorrhea and chlamydia if: ? You are sexually active and are younger than 73 years of  age. ? You are older than 73 years of age and your health care provider tells you that you are at risk for this type of infection. ? Your sexual activity has changed since you were last screened and you are at an increased risk for chlamydia or gonorrhea. Ask your health care provider if you are at risk.  If you do not have HIV, but are at risk, it may be recommended that you take a prescription medicine daily to prevent HIV infection. This is called pre-exposure prophylaxis (PrEP). You are considered at  risk if: ? You are sexually active and do not regularly use condoms or know the HIV status of your partner(s). ? You take drugs by injection. ? You are sexually active with a partner who has HIV.  Talk with your health care provider about whether you are at high risk of being infected with HIV. If you choose to begin PrEP, you should first be tested for HIV. You should then be tested every 3 months for as long as you are taking PrEP. Pregnancy  If you are premenopausal and you may become pregnant, ask your health care provider about preconception counseling.  If you may become pregnant, take 400 to 800 micrograms (mcg) of folic acid every day.  If you want to prevent pregnancy, talk to your health care provider about birth control (contraception). Osteoporosis and menopause  Osteoporosis is a disease in which the bones lose minerals and strength with aging. This can result in serious bone fractures. Your risk for osteoporosis can be identified using a bone density scan.  If you are 6 years of age or older, or if you are at risk for osteoporosis and fractures, ask your health care provider if you should be screened.  Ask your health care provider whether you should take a calcium or vitamin D supplement to lower your risk for osteoporosis.  Menopause may have certain physical symptoms and risks.  Hormone replacement therapy may reduce some of these symptoms and risks. Talk to your health  care provider about whether hormone replacement therapy is right for you. Follow these instructions at home:  Schedule regular health, dental, and eye exams.  Stay current with your immunizations.  Do not use any tobacco products including cigarettes, chewing tobacco, or electronic cigarettes.  If you are pregnant, do not drink alcohol.  If you are breastfeeding, limit how much and how often you drink alcohol.  Limit alcohol intake to no more than 1 drink per day for nonpregnant women. One drink equals 12 ounces of beer, 5 ounces of wine, or 1 ounces of hard liquor.  Do not use street drugs.  Do not share needles.  Ask your health care provider for help if you need support or information about quitting drugs.  Tell your health care provider if you often feel depressed.  Tell your health care provider if you have ever been abused or do not feel safe at home. This information is not intended to replace advice given to you by your health care provider. Make sure you discuss any questions you have with your health care provider. Document Released: 09/02/2010 Document Revised: 07/26/2015 Document Reviewed: 11/21/2014 Elsevier Interactive Patient Education  [employer].

## 2016-12-16 NOTE — Telephone Encounter (Signed)
Pt would liek to be put on the wait list for her Shingrix vaccine Please advise and pt was informed of waitlist and estimated time.

## 2016-12-16 NOTE — Assessment & Plan Note (Signed)
Taking vitamin d daily Will check level 

## 2016-12-16 NOTE — Assessment & Plan Note (Addendum)
No GERD no longer taking protonix Will keep some on hand and use if needed - refilled today

## 2016-12-25 ENCOUNTER — Encounter: Payer: Self-pay | Admitting: Emergency Medicine

## 2016-12-25 ENCOUNTER — Ambulatory Visit (INDEPENDENT_AMBULATORY_CARE_PROVIDER_SITE_OTHER): Payer: 59 | Admitting: Emergency Medicine

## 2016-12-25 DIAGNOSIS — G4733 Obstructive sleep apnea (adult) (pediatric): Secondary | ICD-10-CM

## 2016-12-25 DIAGNOSIS — J45909 Unspecified asthma, uncomplicated: Secondary | ICD-10-CM | POA: Diagnosis not present

## 2016-12-25 NOTE — Assessment & Plan Note (Signed)
She did not tolerate either Symbicort or Bevespi.  We will use albuterol as needed

## 2016-12-25 NOTE — Patient Instructions (Signed)
Please continue to use your CPAP every night Continue to use albuterol 2 puffs up to every 4 hours if needed for shortness of breath. Follow with Dr Lamonte Sakai in 12 months or sooner if you have any problems

## 2016-12-25 NOTE — Progress Notes (Signed)
Subjective:    Patient ID: [name], female    DOB: 07/20/1945, 71 y.o.   MRN: [id number]  HPI 71 year old woman, former smoker (10 + pack years), history of hiatal hernia and GERD, fibromyalgia, depression/anxiety, IBS, obstructive sleep apnea (seen by CY before, not on CPAP), allergic rhinitis. She has been seen by Dr Quay Burow for dyspnea and cough, seemed to evolve after she had a flu-like illness beginning of 2018. She was treated for acute bronchitis with abx + pred, complicated by possible thrush. She is somewhat improved but notes that she keeps a cough, dyspnea with exertion > includes rushing through the house, doing yard work. She had normal PFT in 2012. She questions whether this may have asthma. She was given bevespi and thinks that she may have benefited. She occasionally has breakthrough GERD. She has some nasal allergies and says that she takes a nasal spray (? Dymista).                       ROV 08/29/16 -- Patient with a history of multiple medical problems as outlined above including obstructive sleep apnea, presents for follow-up of her dyspnea, cough. At her last visit I held her bronchodilators, started Protonix given the suspicion that GERD was contributing to her cough. We repeated her pulmonary function testing today and I have reviewed. This shows mild obstruction with a positive bronchodilator response, normal lung volumes, decreased diffusion capacity. She reports that her cough is unchanged. Her breathing has been problematic as well - DOE and also at rest. Especially with the heat or walking uphill.   ROV 12/25/16 --this is a follow-up visit for patient with a history of obstructive sleep apnea, dyspnea and cough.  Her pulmonary function testing has shown evidence for obstruction.  Based on this we started her on Symbicort last visit.  We have treated her with Protonix given the suspected connection between GERD and her cough.  She returns today reporting that  she couldn't tolerate Symbicort or Bevespi, instead went to albuterol to use prn. She believes that it helps her. She has a new CPAP, has been using for several months. Good compliance. She is clinically benefiting. Still wakes up frequently - sometimes to use the bathroom. She drinks coffee in the am,  Not through the day.    Review of Systems  Constitutional: Negative.  Negative for fever and unexpected weight change.  HENT: Negative for congestion, dental problem, ear pain, nosebleeds, postnasal drip, rhinorrhea, sinus pressure, sneezing, sore throat and trouble swallowing.   Eyes: Negative.  Negative for redness and itching.  Respiratory: Positive for cough, chest tightness and shortness of breath. Negative for wheezing.   Cardiovascular: Negative.  Negative for palpitations and leg swelling.  Gastrointestinal: Negative.  Negative for nausea and vomiting.  Endocrine: Negative.   Genitourinary: Negative.  Negative for dysuria.  Musculoskeletal: Negative.  Negative for joint swelling.  Skin: Negative.  Negative for rash.  Allergic/Immunologic: Negative.  Negative for environmental allergies, food allergies and immunocompromised state.  Neurological: Negative for dizziness and headaches.  Hematological: Does not bruise/bleed easily.  Psychiatric/Behavioral: Negative.  Negative for dysphoric mood. The patient is not nervous/anxious.     Past Medical History:  Diagnosis Date  . Anxiety   . Complication of anesthesia   . Depression    generalized anxiety disorder  . Diverticulosis   . Duodenal diverticulum   . Family history of adverse reaction to anesthesia    Mother and Daughters- N/V  .  Fibromyalgia   . Gastroesophageal reflux disease with hiatal hernia   . Hiatal hernia   . History of kidney stones   . Hyperlipidemia   . IBS (irritable bowel syndrome)   . Lymphocytic colitis    Dr Sharlett Iles  . Migraine headache   . Mitral valve prolapse   . Neuropathy   . Osteopenia    BMD  ordered by GYN  . Peripheral neuropathy    treated as RLS by  Neurology  . PONV (postoperative nausea and vomiting)   . Restless leg syndrome   . Schatzki's ring    History of  . Sleep apnea    On CPAP, has not been using  . Spondylosis      Family History  Problem Relation Age of Onset  . Hypertension Father 55  . Stroke Father 66  . Heart attack Father         ? in 84s  . Hypertension Mother   . Neuropathy Mother   . Anemia Mother   . Coronary artery disease Brother        Stent placement in 60s  . Diabetes Maternal Uncle   . Heart attack Paternal [place]el [place] , ? age  . Colon cancer Neg Hx   . Seizures Neg Hx      Social History   Social History  . Marital status: Married    Spouse name: N/A  . Number of children: 2  . Years of education: 41   Occupational History  . retired [employer]   Social History Main Topics  . Smoking status: Former Smoker    Packs/day: 0.50    Years: 15.00    Types: Cigarettes    Quit date: 03/03/1998  . Smokeless tobacco: Never Used     Comment: smoked 1973- 2006, up to <  1  ppd  . Alcohol use No  . Drug use: No  . Sexual activity: Yes    Partners: Male     Comment: 1st intercourse- 78, partners- 2, married- 47 yrs    Other Topics Concern  . Not on file   Social History Narrative   HAS REGULAR EXERCISE   DAILY CAFFEINE: 1-2 CUPS   Patient is right handed.     Allergies  Allergen Reactions  . Adhesive [Tape] Rash  . Albuterol     heartburn  . Cymbalta [Duloxetine Hcl]     Diarrhea, nausea, anxiety worse, shaky  . Lyrica [Pregabalin] Other (See Comments)    Sedation  . Neurontin [Gabapentin] Other (See Comments)    Sedation  . Nitrofuran Derivatives Other (See Comments)    "tingling"  . Paxil [Paroxetine Hcl]     Bowel upset, tingling  . Prozac [Fluoxetine Hcl] Other (See Comments)    Made patient worse and constipation     Outpatient Medications Prior to Visit  Medication Sig Dispense Refill  .  albuterol (PROVENTIL HFA;VENTOLIN HFA) 108 (90 Base) MCG/ACT inhaler Inhale 2 puffs into the lungs every 6 (six) hours as needed for wheezing or shortness of breath. 1 Inhaler 5  . ALPRAZolam (XANAX) 0.25 MG tablet Take 1 tablet (0.25 mg total) by mouth daily as needed for anxiety. 30 tablet 1  . Azelastine-Fluticasone (DYMISTA) 137-50 MCG/ACT SUSP Place 2 sprays into the nose daily.     [name] b complex vitamins capsule Take 1 capsule by mouth daily.    . Biotin 1000 MCG tablet Take 1,000 mcg by mouth daily.      [name]  budesonide-formoterol (SYMBICORT) 160-4.5 MCG/ACT inhaler Inhale 2 puffs into the lungs 2 (two) times daily. 2 Inhaler 0  . estradiol (ESTRACE) 0.1 MG/GM vaginal cream Apply a 1/4 of an applicator vaginally and a thin layer on the vulva 2 times per week. 42.5 g 8  . Glycopyrrolate-Formoterol (BEVESPI AEROSPHERE) 9-4.8 MCG/ACT AERO Inhale 2 puffs into the lungs 2 (two) times daily. 10.7 g 11  . Magnesium Oxide 250 MG TABS Take 250 mg by mouth daily.     . pantoprazole (PROTONIX) 40 MG tablet Take 1 tablet (40 mg total) by mouth daily as needed. 30 tablet 3  . Probiotic Product (ALIGN) 4 MG CAPS Take 1 capsule by mouth daily.     . vitamin C (ASCORBIC ACID) 500 MG tablet Take 500 mg by mouth daily.     No facility-administered medications prior to visit.         Objective:   Physical Exam Vitals:   12/25/16 1419  BP: 112/70  Pulse: 80  SpO2: 94%  Weight: 134 lb (60.8 kg)  Height: 5' 8.5" (1.74 m)   Gen: Pleasant, well-nourished, in no distress,  normal affect  ENT: No lesions,  mouth clear,  oropharynx clear, no postnasal drip  Neck: No JVD, no stridor  Lungs: No use of accessory muscles, clear B   Cardiovascular: RRR, heart sounds normal, no murmur or gallops, no peripheral edema  Musculoskeletal: No deformities, no cyanosis or clubbing  Neuro: alert, non focal  Skin: Warm, no lesions or rashes, very tan      Assessment & Plan:  Obstructive sleep apnea She is  using her CPAP with better compliance.  She has questions today about how to clean it and we talked about using diluted vinegar for the tubing and humidity chamber.  Asthma She did not tolerate either Symbicort or Bevespi.  We will use albuterol as needed  Baltazar Apo, MD, PhD 12/25/2016, 2:36 PM  [facility] [phone] or if no answer [phone]

## 2016-12-25 NOTE — Assessment & Plan Note (Signed)
She is using her CPAP with better compliance.  She has questions today about how to clean it and we talked about using diluted vinegar for the tubing and humidity chamber.

## 2016-12-30 NOTE — Telephone Encounter (Signed)
Patient has been advised that shingrix is now available in the office, patient is going to call back to schedule nurse visit to get first injection when she returns to town---let Lagina Reader know when appt is made so that both vaccines can be labeled and placed in refrig

## 2016-12-31 ENCOUNTER — Ambulatory Visit: Payer: Self-pay | Admitting: Emergency Medicine

## 2016-12-31 NOTE — Progress Notes (Signed)
Cardiology Office Note  Date:  12/31/2016   ID:  [name] [employer], DOB Nov 29, 1945, MRN [id number]  PCP:  Heather Rail, MD   No chief complaint on file.   HPI:  [name] is a pleasant 71 yo with history of hyperlipidemia,fibromyalgia, extensive GI disease including hiatal hernia, GERD, gastritis, Schatzkes ring s/p dilatations presents for follow-up of her chest heaviness, hypotension, dizziness  On today's visit she reports 5 pound weight loss compared to her prior office visit Continued episodes of dizziness. Reports having Some palpitations and tachycardia Stopped the cholesterol medication on her own, started fiber Stress at home, comes and goes Chronic neuropathy in her lower extremities  CT coronary calcium score of 0 September 2016  EKG on today's visit shows normal sinus rhythm with rate 60 bpm, no significant ST or T-wave changes  Previous records reviewed Previous Holter monitor in 2012 showing rare short runs of SVT, ectopy. Previously on midodrine for hypotension, stopped this as it was not helping   family history of coronary artery disease and her chest pressure. Brother with a pacemaker  chronically low blood pressure, previous symptoms of chronic dizziness and fatigue.  She also reports having obstructive sleep apnea, significant snoring and CPAP. She's not wearing her CPAP   previous episodes of heaviness in her chest. Cardiac catheterization in the past showed no significant disease.  Holter monitor showed short runs of SVT. 48-hour study.  episodes of shortness of breath, 30 day event monitor showing ABCs but no significant SVT.   Previous sinus surgery and reports her breathing is better. Still continues to snore with frequent waking at night  Prior echocardiogram showed mild MR with no other significant abnormalities. Strong family history of coronary artery disease.  cholesterol 132, HDL 68, LDL 54  PMH:   has a past  medical history of Anxiety; Complication of anesthesia; Depression; Diverticulosis; Duodenal diverticulum; Family history of adverse reaction to anesthesia; Fibromyalgia; Gastroesophageal reflux disease with hiatal hernia; Hiatal hernia; History of kidney stones; Hyperlipidemia; IBS (irritable bowel syndrome); Lymphocytic colitis; Migraine headache; Mitral valve prolapse; Neuropathy; Osteopenia; Peripheral neuropathy; PONV (postoperative nausea and vomiting); Restless leg syndrome; Schatzki's ring; Sleep apnea; and Spondylosis.  PSH:    Past Surgical History:  Procedure Laterality Date  . ANAL RECTAL MANOMETRY N/A 11/14/2015   Procedure: ANO RECTAL MANOMETRY;  Surgeon: Gatha Mayer, MD;  Location: [facility];  Service: Endoscopy;  Laterality: N/A;  . BREAST ENHANCEMENT SURGERY    . CATARACT EXTRACTION Bilateral   . cataract surgery Left    Dr Bing Plume  . CHOLECYSTECTOMY    . COLONOSCOPY    . ESOPHAGEAL DILATION     X 2  . ROTATOR CUFF REPAIR Left   . SINUS ENDO W/FUSION N/A 05/30/2014   Procedure: REVISION  FRONTAL SINUS SURGERY WITH FUSION SCAN;  Surgeon: Jerrell Belfast, MD;  Location: [facility];  Service: ENT;  Laterality: N/A;  . SINUS SURGERY WITH INSTATRAK     x 2  . TUBAL LIGATION    . Vaginal cystectomy     x 2  . VAGINAL HYSTERECTOMY  05/2007   Vaginal repair, Dr Ubaldo Glassing.  Partial  hysterectomy.    Current Outpatient Prescriptions  Medication Sig Dispense Refill  . albuterol (PROVENTIL HFA;VENTOLIN HFA) 108 (90 Base) MCG/ACT inhaler Inhale 2 puffs into the lungs every 6 (six) hours as needed for wheezing or shortness of breath. 1 Inhaler 5  . ALPRAZolam (XANAX) 0.25 MG tablet Take 1 tablet (0.25 mg total) by mouth  daily as needed for anxiety. 30 tablet 1  . Azelastine-Fluticasone (DYMISTA) 137-50 MCG/ACT SUSP Place 2 sprays into the nose daily.     [name] b complex vitamins capsule Take 1 capsule by mouth daily.    . Biotin 1000 MCG tablet Take 1,000 mcg by mouth daily.      .  budesonide-formoterol (SYMBICORT) 160-4.5 MCG/ACT inhaler Inhale 2 puffs into the lungs 2 (two) times daily. 2 Inhaler 0  . estradiol (ESTRACE) 0.1 MG/GM vaginal cream Apply a 1/4 of an applicator vaginally and a thin layer on the vulva 2 times per week. 42.5 g 8  . Glycopyrrolate-Formoterol (BEVESPI AEROSPHERE) 9-4.8 MCG/ACT AERO Inhale 2 puffs into the lungs 2 (two) times daily. 10.7 g 11  . Magnesium Oxide 250 MG TABS Take 250 mg by mouth daily.     . pantoprazole (PROTONIX) 40 MG tablet Take 1 tablet (40 mg total) by mouth daily as needed. 30 tablet 3  . Probiotic Product (ALIGN) 4 MG CAPS Take 1 capsule by mouth daily.     . vitamin C (ASCORBIC ACID) 500 MG tablet Take 500 mg by mouth daily.     No current facility-administered medications for this visit.      Allergies:   Adhesive [tape]; Albuterol; Cymbalta [duloxetine hcl]; Lyrica [pregabalin]; Neurontin [gabapentin]; Nitrofuran derivatives; Paxil [paroxetine hcl]; and Prozac [fluoxetine hcl]   Social History:  The patient  reports that she quit smoking about 18 years ago. Her smoking use included Cigarettes. She has a 7.50 pack-year smoking history. She has never used smokeless tobacco. She reports that she does not drink alcohol or use drugs.   Family History:   family history includes Anemia in her mother; Coronary artery disease in her brother; Diabetes in her maternal uncle; Heart attack in her father and paternal uncle; Hypertension in her mother; Hypertension (age of onset: 26) in her father; Neuropathy in her mother; Stroke (age of onset: 48) in her father.    Review of Systems: Review of Systems  Constitutional: Negative.   Respiratory: Negative.   Cardiovascular: Positive for palpitations.  Gastrointestinal: Negative.   Musculoskeletal: Negative.   Neurological: Positive for dizziness.       Neuropathy lower extremities  Psychiatric/Behavioral: Negative.   All other systems reviewed and are negative.    PHYSICAL  EXAM: VS:  There were no vitals taken for this visit. , BMI There is no height or weight on file to calculate BMI. GEN: Well nourished, well developed, in no acute distress  HEENT: normal  Neck: no JVD, carotid bruits, or masses Cardiac: RRR; no murmurs, rubs, or gallops,no edema , frequent ectopy/skipping Respiratory:  clear to auscultation bilaterally, normal work of breathing GI: soft, nontender, nondistended, + BS MS: no deformity or atrophy  Skin: warm and dry, no rash Neuro:  Strength and sensation are intact Psych: euthymic mood, full affect    Recent Labs: 12/16/2016: ALT 26; BUN 12; Creatinine, Ser 0.61; Hemoglobin 13.0; Platelets 214.0; Potassium 3.7; Sodium 141; TSH 2.68    Lipid Panel Lab Results  Component Value Date   CHOL 229 (H) 12/16/2016   HDL 67.60 12/16/2016   LDLCALC 149 (H) 12/16/2016   TRIG 60.0 12/16/2016      Wt Readings from Last 3 Encounters:  12/25/16 134 lb (60.8 kg)  12/16/16 134 lb (60.8 kg)  11/10/16 139 lb (63 kg)       ASSESSMENT AND PLAN:  Hyperlipidemia, unspecified hyperlipidemia type - Plan: EKG 12-Lead Calcium score of 0 Long discussion  concerning her recent lab work She is happy with her decision to stop the statin, stay on fiber No further testing needed  Palpitations - Plan: EKG 12-Lead Long discussion concerning various treatment options for her ectopy. Ectopy appreciated on physical exam though not seen on EKG Poor candidate for beta blockers, calcium channel blockers given her low blood pressure. Recommended no further medications at this time  Orthostatic hypotension Recommended hydration, standing up slowly She does not want midodrine or Florinef  Other chest pain Atypical symptoms. No further workup needed   Total encounter time more than 25 minutes  Greater than 50% was spent in counseling and coordination of care with the patient   Disposition:   F/U  12 months   No orders of the defined types were placed  in this encounter.    Signed, Esmond Plants, M.D., Ph.D. 12/31/2016  [facility] [place], [place] [phone]   This encounter was created in error - please disregard.

## 2017-01-02 ENCOUNTER — Encounter (INDEPENDENT_AMBULATORY_CARE_PROVIDER_SITE_OTHER): Payer: 59 | Admitting: Cardiovascular Disease

## 2017-01-02 ENCOUNTER — Encounter: Payer: Self-pay | Admitting: Cardiovascular Disease

## 2017-01-05 NOTE — Telephone Encounter (Signed)
Evidently, patient has scheduled appt to get first shingrix---I have labeled both vaccines And placed in refrig

## 2017-01-06 ENCOUNTER — Ambulatory Visit: Payer: Self-pay

## 2017-01-07 ENCOUNTER — Ambulatory Visit (INDEPENDENT_AMBULATORY_CARE_PROVIDER_SITE_OTHER): Payer: 59

## 2017-01-07 DIAGNOSIS — Z299 Encounter for prophylactic measures, unspecified: Secondary | ICD-10-CM | POA: Diagnosis not present

## 2017-02-12 ENCOUNTER — Other Ambulatory Visit: Payer: Self-pay | Admitting: Internal Medicine

## 2017-02-26 DIAGNOSIS — R42 Dizziness and giddiness: Secondary | ICD-10-CM | POA: Insufficient documentation

## 2017-02-26 NOTE — Progress Notes (Signed)
Cardiology Office Note  Date:  02/27/2017   ID:  [name], DOB [birth year], MRN [id number]  PCP:  [name], MD   Chief Complaint  Patient presents with  . other    12 month f/u c/o sharp chest pain. Meds reviewed verbally with pt.    HPI:  [name] is a pleasant 71 yo with history of hyperlipidemia, fibromyalgia,  extensive GI disease including hiatal hernia, GERD, gastritis, Schatzkes ring s/p dilatations  CT coronary calcium score of 0 September 2016 presents for follow-up of her chest heaviness, hypotension, dizziness  Off cholesterol medication over one year ago Total chol 195 last year, up to 229  In follow-up today she reports having episodes of chest tightness, "like a knife sticking", 30 min "don't feel right" Had 2 episodes since Oct 2018. Nonexertional Some SOB on exertion She is concerned about cardiac etiology  Denies any lightheadedness, dizziness, orthostasis Occasional palpitations.  Does not want medications at this time Chronic neuropathy in her lower extremities  Previous CT coronary calcium score discussed with her, score of 0 September 2016  EKG on today's visit shows normal sinus rhythm with rate 65 bpm, no significant ST or T-wave changes  Previous records reviewed Previous Holter monitor in 2012 showing rare short runs of SVT, ectopy. Previously on midodrine for hypotension, stopped this as it was not helping   family history of coronary artery disease and her chest pressure. Brother with a pacemaker  chronically low blood pressure, previous symptoms of chronic dizziness and fatigue.  She also reports having obstructive sleep apnea, significant snoring and CPAP. She's not wearing her CPAP  previous episodes of heaviness in her chest. Cardiac catheterization in the past showed no significant disease.  Holter monitor showed short runs of SVT. 48-hour study.  episodes of shortness of breath, 30 day event  monitor showing ABCs but no significant SVT.   Previous sinus surgery and reports her breathing is better. Still continues to snore with frequent waking at night  Prior echocardiogram showed mild MR with no other significant abnormalities. Strong family history of coronary artery disease.  cholesterol 132, HDL 68, LDL 54  PMH:   has a past medical history of Anxiety, Complication of anesthesia, Depression, Diverticulosis, Duodenal diverticulum, Family history of adverse reaction to anesthesia, Fibromyalgia, Gastroesophageal reflux disease with hiatal hernia, Hiatal hernia, History of kidney stones, Hyperlipidemia, IBS (irritable bowel syndrome), Lymphocytic colitis, Migraine headache, Mitral valve prolapse, Neuropathy, Osteopenia, Peripheral neuropathy, PONV (postoperative nausea and vomiting), Restless leg syndrome, Schatzki's ring, Sleep apnea, and Spondylosis.  PSH:    Past Surgical History:  Procedure Laterality Date  . ANAL RECTAL MANOMETRY N/A 11/14/2015   Procedure: ANO RECTAL MANOMETRY;  Surgeon: Gatha Mayer, MD;  Location: [facility];  Service: Endoscopy;  Laterality: N/A;  . BREAST ENHANCEMENT SURGERY    . CATARACT EXTRACTION Bilateral   . cataract surgery Left    Dr Bing Plume  . CHOLECYSTECTOMY    . COLONOSCOPY    . ESOPHAGEAL DILATION     X 2  . ROTATOR CUFF REPAIR Left   . SINUS ENDO W/FUSION N/A 05/30/2014   Procedure: REVISION  FRONTAL SINUS SURGERY WITH FUSION SCAN;  Surgeon: Jerrell Belfast, MD;  Location: [place];  Service: ENT;  Laterality: N/A;  . SINUS SURGERY WITH INSTATRAK     x 2  . TUBAL LIGATION    . Vaginal cystectomy     x 2  . VAGINAL HYSTERECTOMY  05/2007   Vaginal repair, Dr  Lomax.  Partial  hysterectomy.    Current Outpatient Medications  Medication Sig Dispense Refill  . albuterol (PROVENTIL HFA;VENTOLIN HFA) 108 (90 Base) MCG/ACT inhaler Inhale 2 puffs into the lungs every 6 (six) hours as needed for wheezing or shortness of breath. 1 Inhaler 5  .  ALPRAZolam (XANAX) 0.25 MG tablet Take 1 tablet (0.25 mg total) by mouth daily as needed for anxiety. 30 tablet 1  . Azelastine-Fluticasone (DYMISTA) 137-50 MCG/ACT SUSP Place 2 sprays into the nose daily.     [name] b complex vitamins capsule Take 1 capsule by mouth daily.    . Biotin 1000 MCG tablet Take 1,000 mcg by mouth daily.      [name] estradiol (ESTRACE) 0.1 MG/GM vaginal cream Apply a 1/4 of an applicator vaginally and a thin layer on the vulva 2 times per week. 42.5 g 8  . Magnesium Oxide 250 MG TABS Take 250 mg by mouth daily.     . pantoprazole (PROTONIX) 40 MG tablet Take 1 tablet (40 mg total) by mouth daily as needed. 30 tablet 3  . Probiotic Product (ALIGN) 4 MG CAPS Take 1 capsule by mouth daily.     . vitamin C (ASCORBIC ACID) 500 MG tablet Take 500 mg by mouth daily.     No current facility-administered medications for this visit.      Allergies:   Adhesive [tape]; Albuterol; Cymbalta [duloxetine hcl]; Lyrica [pregabalin]; Neurontin [gabapentin]; Nitrofuran derivatives; Paxil [paroxetine hcl]; and Prozac [fluoxetine hcl]   Social History:  The patient  reports that she quit smoking about 19 years ago. Her smoking use included cigarettes. She has a 7.50 pack-year smoking history. she has never used smokeless tobacco. She reports that she does not drink alcohol or use drugs.   Family History:   family history includes Anemia in her mother; Coronary artery disease in her brother; Diabetes in her maternal uncle; Heart attack in her father and paternal uncle; Hypertension in her mother; Hypertension (age of onset: 8) in her father; Neuropathy in her mother; Stroke (age of onset: 54) in her father.    Review of Systems: Review of Systems  Constitutional: Negative.   Respiratory: Positive for shortness of breath.   Cardiovascular: Positive for palpitations.  Gastrointestinal: Negative.   Musculoskeletal: Negative.   Neurological: Negative.        Neuropathy lower extremities   Psychiatric/Behavioral: Negative.   All other systems reviewed and are negative.    PHYSICAL EXAM: VS:  BP 98/62 (BP Location: Left Arm, Patient Position: Sitting, Cuff Size: Normal)   Pulse 65   Ht 5' 8.5" (1.74 m)   Wt 137 lb 12 oz (62.5 kg)   BMI 20.64 kg/m  , BMI Body mass index is 20.64 kg/m. GEN: Well nourished, well developed, in no acute distress  HEENT: normal  Neck: no JVD, carotid bruits, or masses Cardiac: RRR; no murmurs, rubs, or gallops,no edema , frequent ectopy/skipping Respiratory:  clear to auscultation bilaterally, normal work of breathing GI: soft, nontender, nondistended, + BS MS: no deformity or atrophy  Skin: warm and dry, no rash Neuro:  Strength and sensation are intact Psych: euthymic mood, full affect    Recent Labs: 12/16/2016: ALT 26; BUN 12; Creatinine, Ser 0.61; Hemoglobin 13.0; Platelets 214.0; Potassium 3.7; Sodium 141; TSH 2.68    Lipid Panel Lab Results  Component Value Date   CHOL 229 (H) 12/16/2016   HDL 67.60 12/16/2016   LDLCALC 149 (H) 12/16/2016   TRIG 60.0 12/16/2016  Wt Readings from Last 3 Encounters:  02/27/17 137 lb 12 oz (62.5 kg)  12/25/16 134 lb (60.8 kg)  12/16/16 134 lb (60.8 kg)       ASSESSMENT AND PLAN:  Hyperlipidemia, unspecified hyperlipidemia type - Plan: EKG 12-Lead Calcium score of 0 Long discussion concerning her recent lab work Weight is up over the past year likely contributing to higher cholesterol numbers She does not want medications, prefers to do this with diet  Palpitations - Plan: EKG 12-Lead Long discussion concerning various treatment options for her ectopy. Previous monitor with short runs of SVT Previously with ectopy on exam Poor candidate for beta blockers, calcium channel blockers given her low blood pressure. Recommended no further medications at this time  Orthostatic hypotension Recommended hydration, standing up slowly She does not want midodrine or Florinef She has  been relatively asymptomatic  chest pain Also with symptoms of shortness of breath Reports strong family history of coronary disease Requesting further evaluation, we have ordered stress echocardiogram She feels that she is able to treadmill   Total encounter time more than 25 minutes  Greater than 50% was spent in counseling and coordination of care with the patient   Disposition:   F/U  12 months   Orders Placed This Encounter  Procedures  . EKG 12-Lead     Signed, Esmond Plants, M.D., Ph.D. 02/27/2017  [place], [place]

## 2017-02-27 ENCOUNTER — Ambulatory Visit: Payer: 59 | Admitting: Cardiovascular Disease

## 2017-02-27 ENCOUNTER — Encounter: Payer: Self-pay | Admitting: Cardiovascular Disease

## 2017-02-27 VITALS — BP 98/62 | HR 65 | Ht 68.5 in | Wt 137.8 lb

## 2017-02-27 DIAGNOSIS — R0602 Shortness of breath: Secondary | ICD-10-CM | POA: Diagnosis not present

## 2017-02-27 DIAGNOSIS — E782 Mixed hyperlipidemia: Secondary | ICD-10-CM | POA: Diagnosis not present

## 2017-02-27 DIAGNOSIS — I951 Orthostatic hypotension: Secondary | ICD-10-CM

## 2017-02-27 DIAGNOSIS — R42 Dizziness and giddiness: Secondary | ICD-10-CM | POA: Diagnosis not present

## 2017-02-27 DIAGNOSIS — R079 Chest pain, unspecified: Secondary | ICD-10-CM

## 2017-02-27 NOTE — Patient Instructions (Addendum)
Medication Instructions:   No medication changes made  Labwork:  No new labs needed  Testing/Procedures:  We will order a stress echocardiogram for chest pain and SOB Your physician has requested that you have a stress echocardiogram. For further information please visit [email or web address]. Please follow instruction sheet as given.   Do not drink or eat foods with caffeine for 24 hours before the test. (Chocolate, coffee, tea, or energy drinks)  If you use an inhaler, bring it with you to the test.  Do not smoke for 4 hours before the test.  Wear comfortable shoes and clothing.   Follow-Up: It was a pleasure seeing you in the office today. Please call us if you have new issues that need to be addressed before your next appt.  [phone]  Your physician wants you to follow-up in: 12 months.  You will receive a reminder letter in the mail two months in advance. If you don't receive a letter, please call our office to schedule the follow-up appointment.  If you need a refill on your cardiac medications before your next appointment, please call your pharmacy.     Exercise Stress Echocardiogram An exercise stress echocardiogram is a test that checks how well your heart is working. For this test, you will walk on a treadmill to make your heart beat faster. This test uses sound waves (ultrasound) and a computer to make pictures (images) of your heart. These pictures will be taken before you exercise and after you exercise. What happens before the procedure?  Follow instructions from your doctor about what you cannot eat or drink before the test.  Do not drink or eat anything that has caffeine in it. Stop having caffeine for 24 hours before the test.  Ask your doctor about changing or stopping your normal medicines. This is important if you take diabetes medicines or blood thinners. Ask your doctor if you should take your medicines with water before the test.  If you use an  inhaler, bring it to the test.  Do not use any products that have nicotine or tobacco in them, such as cigarettes and e-cigarettes. Stop using them for 4 hours before the test. If you need help quitting, ask your doctor.  Wear comfortable shoes and clothing. What happens during the procedure?  You will be hooked up to a TV screen. Your doctor will watch the screen to see how fast your heart beats during the test.  Before you exercise, a computer will make a picture of your heart. To do this: ? A gel will be put on your chest. ? A wand will be moved over the gel. ? Sound waves from the wand will go to the computer to make the picture.  Your will start walking on a treadmill. The treadmill will start at a slow speed. It will get faster a little bit at a time. When you walk faster, your heart will beat faster.  The treadmill will be stopped when your heart is working hard.  You will lie down right away so another picture of your heart can be taken.  The test will take 30-60 minutes. What happens after the procedure?  Your heart rate and blood pressure will be watched after the test.  If your doctor says that you can, you may: ? Eat what you usually eat. ? Do your normal activities. ? Take medicines like normal. Summary  An exercise stress echocardiogram is a test that checks how well your heart is working.  Follow  instructions about what you cannot eat or drink before the test. Ask your doctor if you should take your normal medicines before the test.  Stop having caffeine for 24 hours before the test. Do not use anything with nicotine or tobacco in it for 4 hours before the test.  A computer will take a picture of your heart before you walk on a treadmill. It will take another picture when you are done walking.  Your heart rate and blood pressure will be watched after the test. This information is not intended to replace advice given to you by your health care provider. Make sure  you discuss any questions you have with your health care provider. Document Released: 12/15/2008 Document Revised: 11/11/2015 Document Reviewed: 11/11/2015 Elsevier Interactive Patient Education  2017 [employer].

## 2017-03-04 ENCOUNTER — Other Ambulatory Visit: Payer: Self-pay | Admitting: Cardiovascular Disease

## 2017-03-04 DIAGNOSIS — R42 Dizziness and giddiness: Secondary | ICD-10-CM

## 2017-03-04 DIAGNOSIS — R0600 Dyspnea, unspecified: Secondary | ICD-10-CM

## 2017-03-04 DIAGNOSIS — R079 Chest pain, unspecified: Secondary | ICD-10-CM

## 2017-03-10 ENCOUNTER — Telehealth: Payer: Self-pay | Admitting: Internal Medicine

## 2017-03-10 ENCOUNTER — Ambulatory Visit: Payer: Self-pay | Admitting: *Deleted

## 2017-03-10 NOTE — Telephone Encounter (Signed)
Contacted pt regarding her status; she states that she did not feel like going anywhere; pt reminded of previous recommendation per nurse triage; she states that she is trying to eat crackers and drink gatorade; she says that she will have her husband take her to ED/Urgent care if she does not feel better.

## 2017-03-10 NOTE — Telephone Encounter (Signed)
Per Telephone encounter, pt was call back by RN Triage and states she did not feel like going anywhere but would have her husband take her if she felt she was getting worse

## 2017-03-10 NOTE — Telephone Encounter (Signed)
Pt called stating that she is having diarrhea and vomiting since Sunday 03/08/17; she also reports chills, headache and weakness; nurse triage initiated and recommendation made for pt to call 911; pt declines but states that she will get her husband to take her to ED or urgent care; will route to [facility] for notification of this encounter. Reason for Disposition . Shock suspected (e.g., cold/pale/clammy skin, too weak to stand, low BP, rapid pulse)  Answer Assessment - Initial Assessment Questions 1. VOMITING SEVERITY: "How many times have you vomited in the past 24 hours?"     - MILD:  1 - 2 times/day    - MODERATE: 3 - 5 times/day, decreased oral intake without significant weight loss or symptoms of dehydration    - SEVERE: 6 or more times/day, vomits everything or nearly everything, with significant weight loss, symptoms of dehydration      Can't keep count 2. ONSET: "When did the vomiting begin?"      03/08/17 3. FLUIDS: "What fluids or food have you vomited up today?" "Have you been able to keep any fluids down?"     Crackers and gatorade; no 4. ABDOMINAL PAIN: "Are your having any abdominal pain?" If yes : "How bad is it and what does it feel like?" (e.g., crampy, dull, intermittent, constant)     constant 5. DIARRHEA: "Is there any diarrhea?" If so, ask: "How many times today?"      yes 6. CONTACTS: "Is there anyone else in the family with the same symptoms?"      no 7. CAUSE: "What do you think is causing your vomiting?"     unknown 8. HYDRATION STATUS: "Any signs of dehydration?" (e.g., dry mouth [not only dry lips], too weak to stand) "When did you last urinate?"    Weak, cracked lips 9. OTHER SYMPTOMS: "Do you have any other symptoms?" (e.g., fever, headache, vertigo, vomiting blood or coffee grounds, recent head injury)     Chills and headache 10. PREGNANCY: "Is there any chance you are pregnant?" "When was your last menstrual period?"       no  Protocols used: [facility]

## 2017-03-12 ENCOUNTER — Other Ambulatory Visit: Payer: Self-pay

## 2017-03-13 ENCOUNTER — Other Ambulatory Visit: Payer: Self-pay

## 2017-04-14 ENCOUNTER — Other Ambulatory Visit: Payer: Self-pay

## 2017-04-28 ENCOUNTER — Ambulatory Visit (INDEPENDENT_AMBULATORY_CARE_PROVIDER_SITE_OTHER)
Admission: RE | Admit: 2017-04-28 | Discharge: 2017-04-28 | Disposition: A | Discharge status: Home / Self Care | Payer: 59 | Source: Ambulatory Visit | Attending: Family Medicine | Admitting: Family Medicine

## 2017-04-28 ENCOUNTER — Ambulatory Visit: Payer: Self-pay | Admitting: *Deleted

## 2017-04-28 ENCOUNTER — Telehealth: Payer: Self-pay | Admitting: Family Medicine

## 2017-04-28 ENCOUNTER — Ambulatory Visit: Payer: 59 | Admitting: Family Medicine

## 2017-04-28 ENCOUNTER — Encounter: Payer: Self-pay | Admitting: Family Medicine

## 2017-04-28 VITALS — BP 112/58 | HR 63 | Temp 97.5°F | Ht 68.5 in | Wt 140.8 lb

## 2017-04-28 DIAGNOSIS — M545 Low back pain, unspecified: Secondary | ICD-10-CM

## 2017-04-28 DIAGNOSIS — W19XXXA Unspecified fall, initial encounter: Secondary | ICD-10-CM | POA: Diagnosis not present

## 2017-04-28 DIAGNOSIS — G44319 Acute post-traumatic headache, not intractable: Secondary | ICD-10-CM

## 2017-04-28 DIAGNOSIS — M542 Cervicalgia: Secondary | ICD-10-CM | POA: Insufficient documentation

## 2017-04-28 DIAGNOSIS — M8580 Other specified disorders of bone density and structure, unspecified site: Secondary | ICD-10-CM

## 2017-04-28 NOTE — Telephone Encounter (Signed)
Pt called because she had fallen twice this morning and hit her head. Wanted to check on her and make sure she is alright. Left message for her to give [place] a call back.

## 2017-04-28 NOTE — Telephone Encounter (Signed)
Pt at [place] now.

## 2017-04-28 NOTE — Assessment & Plan Note (Signed)
Had an acute fall today. Has a history of osteopenia. No significant region. Has a history of osteopenia. - X-rays today

## 2017-04-28 NOTE — Assessment & Plan Note (Signed)
Pain seems to be localized. No suggestion of fracture on exam today. Pain likely related to muscular origin. - X-rays today

## 2017-04-28 NOTE — Telephone Encounter (Signed)
Left VM for patient. If she calls back please have her speak with a nurse/CMA and inform that her CT head was normal.   If any questions then please take the best time and phone number to call and I will try to call her back.   Rosemarie Ax, MD [facility] 04/28/2017, 5:26 PM

## 2017-04-28 NOTE — Patient Instructions (Signed)
We will call you with results from today. Please seek immediate care if you have any change in your mental status

## 2017-04-28 NOTE — Progress Notes (Signed)
[name] - 72 y.o. female MRN [id number]  Date of birth: [birth year]  SUBJECTIVE:  Including CC & ROS.  Chief Complaint  Patient presents with  . Head Injury    [name] is a 72 y.o. female that is presenting with a head injury. She fell twice this morning in her bathroom. The first time she fell backwards and landed on her back, she then got up and lost her balance in her bedroom lost her footing and fell hit the back of her head. Pain is located in the occipital region. Denies dizziness. Admits to some blurriness. Does not remember if she lost consciousness. She denies any change in her vision. She has not had any deficits to speak of. She is able to walk normally. She felt warm initially after the first fall and now she has the chills. She has pain in her neck in the posterior aspect and pain in her lower back. She also has pain in the occipital region of her head. She does have a history of osteopenia. She is not currently taking a anticoagulant. She denies having any loss of consciousness.    Review of Systems  Constitutional: Positive for chills. Negative for fever.  HENT: Negative for ear pain.   Respiratory: Negative for cough.   Cardiovascular: Negative for chest pain.  Gastrointestinal: Negative for abdominal pain.  Musculoskeletal: Positive for back pain and neck pain.  Skin: Negative for color change.  Neurological: Positive for headaches. Negative for speech difficulty.  Hematological: Negative for adenopathy.  Psychiatric/Behavioral: Negative for agitation.    HISTORY: Past Medical, Surgical, Social, and Family History Reviewed & Updated per EMR.   Pertinent Historical Findings include:  Past Medical History:  Diagnosis Date  . Anxiety   . Complication of anesthesia   . Depression    generalized anxiety disorder  . Diverticulosis   . Duodenal diverticulum   . Family history of adverse reaction to anesthesia    Mother and  Daughters- N/V  . Fibromyalgia   . Gastroesophageal reflux disease with hiatal hernia   . Hiatal hernia   . History of kidney stones   . Hyperlipidemia   . IBS (irritable bowel syndrome)   . Lymphocytic colitis    Dr Sharlett Iles  . Migraine headache   . Mitral valve prolapse   . Neuropathy   . Osteopenia    BMD ordered by GYN  . Peripheral neuropathy    treated as RLS by  Neurology  . PONV (postoperative nausea and vomiting)   . Restless leg syndrome   . Schatzki's ring    History of  . Sleep apnea    On CPAP, has not been using  . Spondylosis     Past Surgical History:  Procedure Laterality Date  . ANAL RECTAL MANOMETRY N/A 11/14/2015   Procedure: ANO RECTAL MANOMETRY;  Surgeon: Gatha Mayer, MD;  Location: [facility];  Service: Endoscopy;  Laterality: N/A;  . BREAST ENHANCEMENT SURGERY    . CATARACT EXTRACTION Bilateral   . cataract surgery Left    Dr Bing Plume  . CHOLECYSTECTOMY    . COLONOSCOPY    . ESOPHAGEAL DILATION     X 2  . ROTATOR CUFF REPAIR Left   . SINUS ENDO W/FUSION N/A 05/30/2014   Procedure: REVISION  FRONTAL SINUS SURGERY WITH FUSION SCAN;  Surgeon: Jerrell Belfast, MD;  Location: [place];  Service: ENT;  Laterality: N/A;  . SINUS SURGERY WITH INSTATRAK     x 2  .  TUBAL LIGATION    . Vaginal cystectomy     x 2  . VAGINAL HYSTERECTOMY  05/2007   Vaginal repair, Dr Ubaldo Glassing.  Partial  hysterectomy.    Allergies  Allergen Reactions  . Adhesive [Tape] Rash  . Albuterol     heartburn  . Cymbalta [Duloxetine Hcl]     Diarrhea, nausea, anxiety worse, shaky  . Lyrica [Pregabalin] Other (See Comments)    Sedation  . Neurontin [Gabapentin] Other (See Comments)    Sedation  . Nitrofuran Derivatives Other (See Comments)    "tingling"  . Paxil [Paroxetine Hcl]     Bowel upset, tingling  . Prozac [Fluoxetine Hcl] Other (See Comments)    Made patient worse and constipation    Family History  Problem Relation Age of Onset  . Hypertension Father 38  .  Stroke Father 34  . Heart attack Father         ? in 22s  . Hypertension Mother   . Neuropathy Mother   . Anemia Mother   . Coronary artery disease Brother        Stent placement in 60s  . Diabetes Maternal Uncle   . Heart attack Paternal Carmel [place] , ? age  . Colon cancer Neg Hx   . Seizures Neg Hx      Social History   Socioeconomic History  . Marital status: Married    Spouse name: Not on file  . Number of children: 2  . Years of education: 43  . Highest education level: Not on file  Social Needs  . Financial resource strain: Not on file  . Food insecurity - worry: Not on file  . Food insecurity - inability: Not on file  . Transportation needs - medical: Not on file  . Transportation needs - non-medical: Not on file  Occupational History  . Occupation: retired    Fish farm manager: [employer]  Tobacco Use  . Smoking status: Former Smoker    Packs/day: 0.50    Years: 15.00    Pack years: 7.50    Types: Cigarettes    Last attempt to quit: 03/03/1998    Years since quitting: 19.1  . Smokeless tobacco: Never Used  . Tobacco comment: smoked 1973- 2006, up to <  1  ppd  Substance and Sexual Activity  . Alcohol use: No    Alcohol/week: 0.0 oz  . Drug use: No  . Sexual activity: Yes    Partners: Male    Comment: 1st intercourse- 89, partners- 2, married- 34 yrs   Other Topics Concern  . Not on file  Social History Narrative   HAS REGULAR EXERCISE   DAILY CAFFEINE: 1-2 CUPS   Patient is right handed.     PHYSICAL EXAM:  VS: BP (!) 112/58 (BP Location: Left Arm, Patient Position: Sitting, Cuff Size: Normal)   Pulse 63   Temp (!) 97.5 F (36.4 C) (Oral)   Ht 5' 8.5" (1.74 m)   Wt 140 lb 12.8 oz (63.9 kg)   SpO2 98%   BMI 21.10 kg/m  Physical Exam Gen: NAD, alert, cooperative with exam,  ENT: normal lips, normal nasal mucosa,  Eye: normal EOM, normal conjunctiva and lids CV:  no edema, +2 pedal pulses   Resp: no accessory muscle use, non-labored,   Skin:  no rashes, no areas of induration small abrasion on the posterior aspect near the olecranon of the right elbow, no laceration in the occipital  region of her scalp. Neuro: normal tone, normal sensation to touch cranial nerves II through XII intact Psych:  normal insight, alert and oriented MSK:  No significant tenderness to the midline cervical spine. Some tenderness to the muscles on the left and right side of the cervical spine. Tenderness to palpation of the paraspinal muscles in the lumbar spine. No significant tenderness to palpation over the midline lumbar spine. Walking with a slow gait Neurovascularly intact     ASSESSMENT & PLAN:   Back pain Had an acute fall today. Has a history of osteopenia. No significant region. Has a history of osteopenia. - X-rays today  Acute post-traumatic headache, not intractable Fell back and hit her head. No laceration on exam today. Pain in the occipital region. No deficits on exam today. - CT head to evaluate for any fracture or brain bleed.  Neck pain Pain seems to be localized. No suggestion of fracture on exam today. Pain likely related to muscular origin. - X-rays today

## 2017-04-28 NOTE — Assessment & Plan Note (Signed)
Fell back and hit her head. No laceration on exam today. Pain in the occipital region. No deficits on exam today. - CT head to evaluate for any fracture or brain bleed.

## 2017-04-29 ENCOUNTER — Telehealth: Payer: Self-pay | Admitting: Family Medicine

## 2017-04-29 NOTE — Telephone Encounter (Signed)
Spoke with patient about her neck x-rays and her lumbar spine x-rays. These show chronic changes but do not show any compression fracture anything acutely different. Advised her to continue the heat and ice. Encouraged her to alternate Tylenol and ibuprofen. Advised follow-up in a few weeks if no improvement.  Rosemarie Ax, MD [facility] 04/29/2017, 11:39 AM

## 2017-05-01 ENCOUNTER — Ambulatory Visit: Payer: Self-pay

## 2017-05-07 ENCOUNTER — Ambulatory Visit: Payer: Self-pay

## 2017-05-14 ENCOUNTER — Ambulatory Visit (INDEPENDENT_AMBULATORY_CARE_PROVIDER_SITE_OTHER): Payer: 59

## 2017-05-14 DIAGNOSIS — Z23 Encounter for immunization: Secondary | ICD-10-CM | POA: Diagnosis not present

## 2017-05-14 NOTE — Progress Notes (Signed)
Injection given.   Shalinda Burkholder J Alliana Mcauliff, MD  

## 2017-06-02 ENCOUNTER — Other Ambulatory Visit: Payer: Self-pay

## 2017-06-04 ENCOUNTER — Encounter: Payer: Self-pay | Admitting: Cardiovascular Disease

## 2017-06-16 LAB — HM MAMMOGRAPHY

## 2017-06-23 ENCOUNTER — Encounter: Payer: Self-pay | Admitting: Internal Medicine

## 2017-06-26 ENCOUNTER — Other Ambulatory Visit: Payer: Self-pay | Admitting: Cardiovascular Disease

## 2017-06-26 DIAGNOSIS — R079 Chest pain, unspecified: Secondary | ICD-10-CM

## 2017-06-26 DIAGNOSIS — R0602 Shortness of breath: Secondary | ICD-10-CM

## 2017-07-13 ENCOUNTER — Telehealth: Payer: Self-pay | Admitting: *Deleted

## 2017-07-13 NOTE — Telephone Encounter (Signed)
Patient verbalized understanding of appointment for stress echo tomorrow at 1030 am as well as the instructions as provided on AVS from office visit.

## 2017-07-14 ENCOUNTER — Ambulatory Visit (INDEPENDENT_AMBULATORY_CARE_PROVIDER_SITE_OTHER): Payer: 59

## 2017-07-14 ENCOUNTER — Other Ambulatory Visit: Payer: Self-pay

## 2017-07-14 DIAGNOSIS — R0602 Shortness of breath: Secondary | ICD-10-CM

## 2017-07-14 DIAGNOSIS — R079 Chest pain, unspecified: Secondary | ICD-10-CM | POA: Diagnosis not present

## 2017-07-15 LAB — ECHOCARDIOGRAM STRESS TEST
CHL CUP RESTING HR STRESS: 76 {beats}/min
CSEPEDS: 29 s
CSEPHR: 88 %
Estimated workload: 10.1 METS
Exercise duration (min): 8 min
MPHR: 147 {beats}/min
Peak HR: 130 {beats}/min

## 2017-07-16 ENCOUNTER — Telehealth: Payer: Self-pay | Admitting: Cardiovascular Disease

## 2017-07-16 NOTE — Telephone Encounter (Signed)
Patient is calling to check on status of stress test results Please call to discuss

## 2017-07-17 ENCOUNTER — Other Ambulatory Visit: Payer: Self-pay | Admitting: *Deleted

## 2017-07-17 DIAGNOSIS — R002 Palpitations: Secondary | ICD-10-CM

## 2017-07-17 NOTE — Telephone Encounter (Signed)
Spoke with patient and advised that results are still pending review by Dr. Rockey Situ. Reviewed preliminary findings but advised that he will need to finalize the report and that I will call once that is done. She also inquired about the low blood pressures and heart rate changes that were noted and Dr. Olin Pia comments of possible chronotropic incompetence. Advised that those notations were documented and that Dr. Rockey Situ will review when completing those results. She was appreciative for the call and let her know that we would call back once report is available.

## 2017-08-12 ENCOUNTER — Ambulatory Visit (INDEPENDENT_AMBULATORY_CARE_PROVIDER_SITE_OTHER): Payer: 59

## 2017-08-12 DIAGNOSIS — R002 Palpitations: Secondary | ICD-10-CM | POA: Diagnosis not present

## 2017-09-16 ENCOUNTER — Telehealth: Payer: Self-pay | Admitting: Cardiovascular Disease

## 2017-09-16 NOTE — Telephone Encounter (Signed)
Monitor results-  Notes recorded by Minna Merritts, MD on 09/15/2017 at 7:20 PM EDT Event monitor Shows frequent very short episodes of tachycardia Often do not last for a long, less than 15 seconds We can treat this with her medication if she would like Would usually try beta blockers such as metoprolol  Called patient with monitor results. Patient wanted to make an appointment with Dr. Rockey Situ to discuss monitor results and options. Will forward to [facility] his nurse, so she is aware.

## 2017-09-16 NOTE — Telephone Encounter (Signed)
Pt calling asking about monitor results  Please call back

## 2017-10-14 ENCOUNTER — Ambulatory Visit: Payer: 59 | Admitting: Cardiovascular Disease

## 2017-10-14 ENCOUNTER — Encounter: Payer: Self-pay | Admitting: Cardiovascular Disease

## 2017-10-14 VITALS — BP 84/60 | HR 70 | Ht 68.0 in | Wt 128.5 lb

## 2017-10-14 DIAGNOSIS — E782 Mixed hyperlipidemia: Secondary | ICD-10-CM

## 2017-10-14 DIAGNOSIS — R079 Chest pain, unspecified: Secondary | ICD-10-CM

## 2017-10-14 DIAGNOSIS — F418 Other specified anxiety disorders: Secondary | ICD-10-CM

## 2017-10-14 DIAGNOSIS — R002 Palpitations: Secondary | ICD-10-CM

## 2017-10-14 DIAGNOSIS — I951 Orthostatic hypotension: Secondary | ICD-10-CM | POA: Diagnosis not present

## 2017-10-14 DIAGNOSIS — G4700 Insomnia, unspecified: Secondary | ICD-10-CM

## 2017-10-14 DIAGNOSIS — R0602 Shortness of breath: Secondary | ICD-10-CM

## 2017-10-14 DIAGNOSIS — F411 Generalized anxiety disorder: Secondary | ICD-10-CM

## 2017-10-14 NOTE — Progress Notes (Signed)
Cardiology Office Note  Date:  10/14/2017   ID:  [name], DOB 1945/07/08, MRN [id number]  PCP:  [name], MD   Chief Complaint  Patient presents with  . OTHER    F/u monitor and echo c/o chest heaviness, sob and fatigue. Meds reviewed verbally with pt.    HPI:  [name] is a pleasant 72 yo with history of hyperlipidemia, fibromyalgia,  despression extensive GI disease including hiatal hernia, GERD, gastritis, Schatzkes ring s/p dilatations  CT coronary calcium score of 0 September 2016 presents for follow-up of her chronic chest heaviness, hypotension, dizziness  On her visit today she reports having constant quivering in her chest, insomnia, fatigue malaise, shortness of breath Significant stressors at home, details not discussed, possibly involving 58 year old child "My marriage is fine' She reports long history of depression anxiety Was given Xanax for sleep by primary care Has been using half dose of 0.25 mg pill once every 2 weeks Sleep continues to be poor No energy to do anything Has not been doing her regular exercise program  Recent studies reviewed with her Event monitor 08/12/2017 Shows frequent very short episodes of tachycardia Often do not last for a long, less than 15 seconds  Stress test 07/14/2017 Normal wall motion, good exercise tolerance Target heart rate achieved on stress test No indication of blockage based on findings  Chronic neuropathy in her lower extremities Previous CT coronary calcium score discussed with her, score of 0 September 2016  EKG personally reviewed by myself on todays visit  shows normal sinus rhythm with rate 70 bpm, no significant ST or T-wave changes  Previous records reviewed Previous Holter monitor in 2012 showing rare short runs of SVT, ectopy. Previously on midodrine for hypotension, stopped this as it was not helping   family history of coronary artery disease and her chest  pressure. Brother with a pacemaker  chronically low blood pressure, previous symptoms of chronic dizziness and fatigue.  She also reports having obstructive sleep apnea, significant snoring and CPAP. She's not wearing her CPAP  previous episodes of heaviness in her chest. Cardiac catheterization in the past showed no significant disease.  Holter monitor showed short runs of SVT. 48-hour study.  episodes of shortness of breath, 30 day event monitor showing ABCs but no significant SVT.   Previous sinus surgery and reports her breathing is better. Still continues to snore with frequent waking at night  Prior echocardiogram showed mild MR with no other significant abnormalities. Strong family history of coronary artery disease.  cholesterol 132, HDL 68, LDL 54  PMH:   has a past medical history of Anxiety, Complication of anesthesia, Depression, Diverticulosis, Duodenal diverticulum, Family history of adverse reaction to anesthesia, Fibromyalgia, Gastroesophageal reflux disease with hiatal hernia, Hiatal hernia, History of kidney stones, Hyperlipidemia, IBS (irritable bowel syndrome), Lymphocytic colitis, Migraine headache, Mitral valve prolapse, Neuropathy, Osteopenia, Peripheral neuropathy, PONV (postoperative nausea and vomiting), Restless leg syndrome, Schatzki's ring, Sleep apnea, and Spondylosis.  PSH:    Past Surgical History:  Procedure Laterality Date  . ANAL RECTAL MANOMETRY N/A 11/14/2015   Procedure: ANO RECTAL MANOMETRY;  Surgeon: Gatha Mayer, MD;  Location: [facility];  Service: Endoscopy;  Laterality: N/A;  . BREAST ENHANCEMENT SURGERY    . CATARACT EXTRACTION Bilateral   . cataract surgery Left    Dr Bing Plume  . CHOLECYSTECTOMY    . COLONOSCOPY    . ESOPHAGEAL DILATION     X 2  . ROTATOR CUFF REPAIR Left   .  SINUS ENDO W/FUSION N/A 05/30/2014   Procedure: REVISION  FRONTAL SINUS SURGERY WITH FUSION SCAN;  Surgeon: Jerrell Belfast, MD;  Location: [place];  Service: ENT;   Laterality: N/A;  . SINUS SURGERY WITH INSTATRAK     x 2  . TUBAL LIGATION    . Vaginal cystectomy     x 2  . VAGINAL HYSTERECTOMY  05/2007   Vaginal repair, Dr Ubaldo Glassing.  Partial  hysterectomy.    Current Outpatient Medications  Medication Sig Dispense Refill  . albuterol (PROVENTIL HFA;VENTOLIN HFA) 108 (90 Base) MCG/ACT inhaler Inhale 2 puffs into the lungs every 6 (six) hours as needed for wheezing or shortness of breath. 1 Inhaler 5  . ALPRAZolam (XANAX) 0.25 MG tablet Take 1 tablet (0.25 mg total) by mouth daily as needed for anxiety. 30 tablet 1  . Azelastine-Fluticasone (DYMISTA) 137-50 MCG/ACT SUSP Place 2 sprays into the nose daily.     [name] Kitchen b complex vitamins capsule Take 1 capsule by mouth daily.    . Biotin 1000 MCG tablet Take 1,000 mcg by mouth daily.      [name] Kitchen estradiol (ESTRACE) 0.1 MG/GM vaginal cream Apply a 1/4 of an applicator vaginally and a thin layer on the vulva 2 times per week. 42.5 g 8  . Magnesium Oxide 250 MG TABS Take 250 mg by mouth daily.     . pantoprazole (PROTONIX) 40 MG tablet Take 1 tablet (40 mg total) by mouth daily as needed. 30 tablet 3  . Probiotic Product (ALIGN) 4 MG CAPS Take 1 capsule by mouth daily.     . vitamin C (ASCORBIC ACID) 500 MG tablet Take 500 mg by mouth daily.     No current facility-administered medications for this visit.      Allergies:   Adhesive [tape]; Albuterol; Cymbalta [duloxetine hcl]; Lyrica [pregabalin]; Neurontin [gabapentin]; Nitrofuran derivatives; Paxil [paroxetine hcl]; and Prozac [fluoxetine hcl]   Social History:  The patient  reports that she quit smoking about 19 years ago. Her smoking use included cigarettes. She has a 7.50 pack-year smoking history. She has never used smokeless tobacco. She reports that she does not drink alcohol or use drugs.   Family History:   family history includes Anemia in her mother; Coronary artery disease in her brother; Diabetes in her maternal uncle; Heart attack in her father and  paternal uncle; Hypertension in her mother; Hypertension (age of onset: 47) in her father; Neuropathy in her mother; Stroke (age of onset: 80) in her father.    Review of Systems: Review of Systems  Constitutional: Positive for malaise/fatigue and weight loss.  Respiratory: Positive for shortness of breath.   Cardiovascular: Negative.   Gastrointestinal: Negative.   Musculoskeletal: Negative.   Neurological: Negative.        Neuropathy lower extremities  Psychiatric/Behavioral: Positive for depression. The patient is nervous/anxious.   All other systems reviewed and are negative.    PHYSICAL EXAM: VS:  BP (!) 84/60 (BP Location: Left Arm, Patient Position: Sitting, Cuff Size: Normal)   Pulse 70   Ht 5\' 8"  (1.727 m)   Wt 128 lb 8 oz (58.3 kg)   BMI 19.54 kg/m  , BMI Body mass index is 19.54 kg/m. Constitutional:  oriented to person, place, and time. No distress.  HENT:  Head: Normocephalic and atraumatic.  Eyes:  no discharge. No scleral icterus.  Neck: Normal range of motion. Neck supple. No JVD present.  Cardiovascular: Normal rate, regular rhythm, normal heart sounds and intact distal pulses. Exam  reveals no gallop and no friction rub. No edema No murmur heard. Pulmonary/Chest: Effort normal and breath sounds normal. No stridor. No respiratory distress.  no wheezes.  no rales.  no tenderness.  Abdominal: Soft.  no distension.  no tenderness.  Musculoskeletal: Normal range of motion.  no  tenderness or deformity.  Neurological:  normal muscle tone. Coordination normal. No atrophy Skin: Skin is warm and dry. No rash noted. not diaphoretic.  Psychiatric:  normal mood and affect. behavior is normal. Thought content normal.    Recent Labs: 12/16/2016: ALT 26; BUN 12; Creatinine, Ser 0.61; Hemoglobin 13.0; Platelets 214.0; Potassium 3.7; Sodium 141; TSH 2.68    Lipid Panel Lab Results  Component Value Date   CHOL 229 (H) 12/16/2016   HDL 67.60 12/16/2016   LDLCALC 149 (H)  12/16/2016   TRIG 60.0 12/16/2016      Wt Readings from Last 3 Encounters:  10/14/17 128 lb 8 oz (58.3 kg)  04/28/17 140 lb 12.8 oz (63.9 kg)  02/27/17 137 lb 12 oz (62.5 kg)       ASSESSMENT AND PLAN:  Depression/anxiety Likely contributing to much of her presentation today Long history of anxiety depression fibromyalgia She does report worsening stressors at home, possibly involving 31 year old child among other stressors Trying Xanax for sleep but taking half dose 0.25 mg once every 2 weeks Sleep is poor, likely affecting her energy in the daytime  Hyperlipidemia, unspecified hyperlipidemia type -  Calcium score of 0 In the past has declined medications for cholesterol Recent 12 pound weight loss  Palpitations - Plan: EKG 12-Lead monitor with short runs of SVT, atrial tachycardia She is actually asymptomatic  no room on her blood pressure medications to add beta blockers or calcium channel blockers Avoid antiarrhythmics No further workup needed  Orthostatic hypotension Recommended hydration, standing up slowly. Do not avoid salt We did discuss midodrine or Florinef She would like to avoid this possible Likely relatively asymptomatic, blood pressure typically runs low Recommended she avoid excessive weight loss  Shortness of breath Recent normal stress test and echocardiogram Likely atypical in nature No further testing needed Previous calcium score 0 Possibly exacerbated by anxiety depression   long discussion with her today Recommended she consider meeting with a counselor We'll also cc primary care. She is not taking much of her Xanax. Recommended she may do better taking more of this for better sleep hygiene  Total encounter time more than 45 minutes  Greater than 50% was spent in counseling and coordination of care with the patient   Disposition:   F/U as needed   Orders Placed This Encounter  Procedures  . EKG 12-Lead     Signed, Esmond Plants,  M.D., Ph.D. 10/14/2017  [place], [place]

## 2017-10-14 NOTE — Patient Instructions (Addendum)
Name of therapist:   Monitor heart rate We will try to keep it less than 100 on a regular basis Check once a day   Medication Instructions:   No medication changes made  Labwork:  No new labs needed  Testing/Procedures:  No further testing at this time   Follow-Up: It was a pleasure seeing you in the office today. Please call us if you have new issues that need to be addressed before your next appt.  [phone]  Your physician wants you to follow-up in:  As needed  If you need a refill on your cardiac medications before your next appointment, please call your pharmacy.  For educational health videos Log in to : [email or web address] Or : [email or web address], password : [email or web address]

## 2017-10-15 ENCOUNTER — Telehealth: Payer: Self-pay | Admitting: Cardiovascular Disease

## 2017-10-15 NOTE — Telephone Encounter (Signed)
No significant leaky valve  Office note sent to PMD

## 2017-10-15 NOTE — Telephone Encounter (Signed)
Patient called to see if we had sent a note over to her primary care provider. She states that the doctor mentioned sending her a fax with information about some possible medication changes. Reviewed that chart was sent to her PCP electronically with summary and any recommendations. Recommended that she may want to reach out to her PCP office to inquire on the status of that. She also had some questions. She wanted to inquire about her previously leaky valve. Reviewed chart and there is no mention of this but after searching chart I do see mention in 12/31/2010 and then again in 08/04/2011. In both notes it does state that no further studies are needed. She wanted to know if this should be rechecked and advised that based on his previous notes it was not necessary. Advised that I would send this to provider but that previous notes did mention no additional testing needed. She verbalized understanding of our conversation, agreement with plan and had no further questions.

## 2017-10-15 NOTE — Telephone Encounter (Signed)
Patient would like to speak with nurse about visit yesterday No details were given when asked Please call to discuss

## 2017-10-16 ENCOUNTER — Telehealth: Payer: Self-pay | Admitting: Internal Medicine

## 2017-10-16 NOTE — Telephone Encounter (Signed)
Appt made

## 2017-10-16 NOTE — Telephone Encounter (Signed)
Copied from [place] [id number]. Topic: Quick Communication - Rx Refill/Question >> Oct 16, 2017  8:22 AM Margot Ables wrote: Medication: ALPRAZolam Duanne Moron) 0.25 MG tablet - pt states that cardiologist told her that 0.25mg  prn is not enough and she should take something stronger and take 1 per day at night - pt states 0.25mg  prn is not helping her stress/anxiety - she states that Dr. Rockey Situ sent OV notes for Dr. Quay Burow to review discussing this - pt asking if this can be sent to pharmacy or if she should come in Has the patient contacted their pharmacy?no - requesting change  Preferred Pharmacy (with phone number or street name): [id number] - [place], [place] [phone] (Phone) [phone] (Fax)

## 2017-10-16 NOTE — Telephone Encounter (Signed)
Pt needs OV per Dr Quay Burow.

## 2017-10-16 NOTE — Telephone Encounter (Signed)
Routed to triage 

## 2017-10-19 NOTE — Progress Notes (Signed)
Subjective:    Patient ID: [name], female    DOB: 1945/07/09, 72 y.o.   MRN: [id number]  HPI The patient is here for follow up.  Anxiety: She is taking 1/2 of a xanax as needed- she does not take it daily or regularly.  The 1/2 of a xanax does not work well. She denies any side effects from the medication. She feels her anxiety is not well controlled.   She has tried anti-depressants in the past.  She had side effects from more than one.  She has a lot of stress and has panic attacks.  Her grandson was in a bad motor cross accident and has had several problems since then.  His parents are divorced and there are constant issues between him and his mother, which is her daughter.  She is in the middle of daughter and grandson. There is a lot of anger.    The stress is overwhelming.  She denies depression, just anxiety.      Medications and allergies reviewed with patient and updated if appropriate.  Patient Active Problem List   Diagnosis Date Noted  . Neck pain 04/28/2017  . Dizziness 02/26/2017  . B12 deficiency 12/16/2016  . Chest discomfort 05/07/2016  . Back pain 05/07/2016  . Asthma 04/22/2016  . Family history of diabetes mellitus (DM) 12/07/2015  . Constipation due to outlet dysfunction   . Obstructive sleep apnea 07/12/2015  . Seasonal and perennial allergic rhinitis 07/12/2015  . Anemia 12/05/2014  . Vitamin D deficiency 12/05/2014  . Sinusitis, chronic 05/30/2014    Class: Chronic  . Vaginal atrophy 10/20/2012  . Insomnia 10/20/2012  . Orthostatic hypotension 02/16/2012  . Cough 02/13/2011  . Palpitations 12/31/2010  . GERD (gastroesophageal reflux disease) 07/25/2010  . Depression with anxiety 07/25/2010  . Lymphocytic colitis 06/14/2010  . Gastroesophageal reflux disease with hiatal hernia   . BACK PAIN, CHRONIC 12/27/2009  . Osteopenia 12/27/2009  . SOB (shortness of breath) 06/25/2009  . Hereditary and idiopathic peripheral neuropathy  12/22/2007  . Hyperlipidemia 03/09/2007  . Generalized anxiety disorder 03/09/2007  . MIGRAINE HEADACHE 03/09/2007  . IBS 03/09/2007  . SPONDYLOSIS 03/09/2007  . Myalgia and myositis 03/09/2007  . SCHATZKI'S RING, HX OF 03/09/2007  . Diaphragmatic hernia 12/30/2006  . GASTRITIS 02/17/2005  . DIVERTICULOSIS OF COLON 01/01/2000    Current Outpatient Medications on File Prior to Visit  Medication Sig Dispense Refill  . albuterol (PROVENTIL HFA;VENTOLIN HFA) 108 (90 Base) MCG/ACT inhaler Inhale 2 puffs into the lungs every 6 (six) hours as needed for wheezing or shortness of breath. 1 Inhaler 5  . ALPRAZolam (XANAX) 0.25 MG tablet Take 1 tablet (0.25 mg total) by mouth daily as needed for anxiety. 30 tablet 1  . Azelastine-Fluticasone (DYMISTA) 137-50 MCG/ACT SUSP Place 2 sprays into the nose daily.     [name] b complex vitamins capsule Take 1 capsule by mouth daily.    . Biotin 1000 MCG tablet Take 1,000 mcg by mouth daily.      [name] estradiol (ESTRACE) 0.1 MG/GM vaginal cream Apply a 1/4 of an applicator vaginally and a thin layer on the vulva 2 times per week. 42.5 g 8  . Magnesium Oxide 250 MG TABS Take 250 mg by mouth daily.     . pantoprazole (PROTONIX) 40 MG tablet Take 1 tablet (40 mg total) by mouth daily as needed. 30 tablet 3  . Probiotic Product (ALIGN) 4 MG CAPS Take 1 capsule by mouth daily.     [name]  vitamin C (ASCORBIC ACID) 500 MG tablet Take 500 mg by mouth daily.    . [DISCONTINUED] colesevelam (WELCHOL) 625 MG tablet Take 625 mg by mouth 2 (two) times daily with a meal. Take one tablet by mouth twice a day    . [DISCONTINUED] Hyoscyamine-Phenyltoloxamine ([place] NF) [id number] MG CAPS Take one tablet by mouth as needed for abdominal cramping 24 each 0   No current facility-administered medications on file prior to visit.     Past Medical History:  Diagnosis Date  . Anxiety   . Complication of anesthesia   . Depression    generalized anxiety disorder  . Diverticulosis   .  Duodenal diverticulum   . Family history of adverse reaction to anesthesia    Mother and Daughters- N/V  . Fibromyalgia   . Gastroesophageal reflux disease with hiatal hernia   . Hiatal hernia   . History of kidney stones   . Hyperlipidemia   . IBS (irritable bowel syndrome)   . Lymphocytic colitis    Dr Sharlett Iles  . Migraine headache   . Mitral valve prolapse   . Neuropathy   . Osteopenia    BMD ordered by GYN  . Peripheral neuropathy    treated as RLS by  Neurology  . PONV (postoperative nausea and vomiting)   . Restless leg syndrome   . Schatzki's ring    History of  . Sleep apnea    On CPAP, has not been using  . Spondylosis     Past Surgical History:  Procedure Laterality Date  . ANAL RECTAL MANOMETRY N/A 11/14/2015   Procedure: ANO RECTAL MANOMETRY;  Surgeon: Gatha Mayer, MD;  Location: [facility];  Service: Endoscopy;  Laterality: N/A;  . BREAST ENHANCEMENT SURGERY    . CATARACT EXTRACTION Bilateral   . cataract surgery Left    Dr Bing Plume  . CHOLECYSTECTOMY    . COLONOSCOPY    . ESOPHAGEAL DILATION     X 2  . ROTATOR CUFF REPAIR Left   . SINUS ENDO W/FUSION N/A 05/30/2014   Procedure: REVISION  FRONTAL SINUS SURGERY WITH FUSION SCAN;  Surgeon: Jerrell Belfast, MD;  Location: [facility];  Service: ENT;  Laterality: N/A;  . SINUS SURGERY WITH INSTATRAK     x 2  . TUBAL LIGATION    . Vaginal cystectomy     x 2  . VAGINAL HYSTERECTOMY  05/2007   Vaginal repair, Dr Ubaldo Glassing.  Partial  hysterectomy.    Social History   Socioeconomic History  . Marital status: Married    Spouse name: Not on file  . Number of children: 2  . Years of education: 48  . Highest education level: Not on file  Occupational History  . Occupation: retired    Fish farm manager: [employer]  . Financial resource strain: Not on file  . Food insecurity:    Worry: Not on file    Inability: Not on file  . Transportation needs:    Medical: Not on file    Non-medical: Not on file    Tobacco Use  . Smoking status: Former Smoker    Packs/day: 0.50    Years: 15.00    Pack years: 7.50    Types: Cigarettes    Last attempt to quit: 03/03/1998    Years since quitting: 19.6  . Smokeless tobacco: Never Used  . Tobacco comment: smoked 1973- 2006, up to <  1  ppd  Substance and Sexual Activity  . Alcohol  use: No    Alcohol/week: 0.0 standard drinks  . Drug use: No  . Sexual activity: Yes    Partners: Male    Comment: 1st intercourse- 39, partners- 2, married- 42 yrs   Lifestyle  . Physical activity:    Days per week: Not on file    Minutes per session: Not on file  . Stress: Not on file  Relationships  . Social connections:    Talks on phone: Not on file    Gets together: Not on file    Attends religious service: Not on file    Active member of club or organization: Not on file    Attends meetings of clubs or organizations: Not on file    Relationship status: Not on file  Other Topics Concern  . Not on file  Social History Narrative   HAS REGULAR EXERCISE   DAILY CAFFEINE: 1-2 CUPS   Patient is right handed.    Family History  Problem Relation Age of Onset  . Hypertension Father 57  . Stroke Father 44  . Heart attack Father         ? in 51s  . Hypertension Mother   . Neuropathy Mother   . Anemia Mother   . Coronary artery disease Brother        Stent placement in 60s  . Diabetes Maternal Uncle   . Heart attack Paternal [place] , ? age  . Colon cancer Neg Hx   . Seizures Neg Hx     Review of Systems  Respiratory: Positive for shortness of breath.   Cardiovascular: Positive for chest pain and palpitations.  Genitourinary: Positive for frequency.  Neurological: Positive for tremors. Negative for light-headedness and headaches.  Psychiatric/Behavioral: Positive for sleep disturbance. Negative for dysphoric mood. The patient is nervous/anxious.        Objective:   Vitals:   10/20/17 1411  BP: 112/70  Pulse: 65  Resp: 14  Temp:  98.3 F (36.8 C)  SpO2: 96%   BP Readings from Last 3 Encounters:  10/20/17 112/70  10/14/17 (!) 84/60  04/28/17 (!) 112/58   Wt Readings from Last 3 Encounters:  10/20/17 128 lb (58.1 kg)  10/14/17 128 lb 8 oz (58.3 kg)  04/28/17 140 lb 12.8 oz (63.9 kg)   Body mass index is 19.46 kg/m.   Physical Exam    Constitutional: Appears well-developed and well-nourished. No distress.  HENT:  Head: Normocephalic and atraumatic.  Cardiovascular: Normal rate, regular rhythm and normal heart sounds.   No murmur heard.   No edema Pulmonary/Chest: Effort normal and breath sounds normal. No respiratory distress. No has no wheezes. No rales.  Skin: Skin is warm and dry. Not diaphoretic.  Psychiatric: anxious mood and affect. Behavior is normal.      Assessment & Plan:    See Problem List for Assessment and Plan of chronic medical problems.

## 2017-10-20 ENCOUNTER — Encounter: Payer: Self-pay | Admitting: Internal Medicine

## 2017-10-20 ENCOUNTER — Ambulatory Visit: Payer: 59 | Admitting: Internal Medicine

## 2017-10-20 VITALS — BP 112/70 | HR 65 | Temp 98.3°F | Resp 14 | Ht 68.0 in | Wt 128.0 lb

## 2017-10-20 DIAGNOSIS — F411 Generalized anxiety disorder: Secondary | ICD-10-CM | POA: Diagnosis not present

## 2017-10-20 MED ORDER — CLONAZEPAM 0.5 MG PO TABS
0.5000 mg | ORAL_TABLET | Freq: Two times a day (BID) | ORAL | 1 refills | Status: DC
Start: 2017-10-20 — End: 2018-02-01

## 2017-10-20 NOTE — Assessment & Plan Note (Addendum)
Poorly controlled on her current medication She has generalized anxiety and is frequently having anxiety attacks She has several physical symptoms related to anxiety Currently taking xanax 0.125 mg daily as needed but not daily which is not working well Will d/c xanax Trial of clonazepam 0.5 mg twice daily to see if that gives her better anxiety controlled and more consistent control  She defers trying an SSRI or SNRI due to prior side effects F/u in about 6 weeks, sooner if needed Therapist information given - stressed seeing a therapist for her and the whole family

## 2017-10-20 NOTE — Patient Instructions (Addendum)
  Medications reviewed and updated.  Changes include   Stopping the xanax and starting clonazepam 0.5 mg twice daily.   Your prescription(s) have been submitted to your pharmacy. Please take as directed and contact our office if you believe you are having problem(s) with the medication(s).    Please followup in October as prescribed for follow up   List of therapist:  [facility] [place] [facility] [place]  [place], [place]  [facility] [place], [place], [place], [address] [phone] Office [phone] [facility]            [place], [place]

## 2017-10-23 ENCOUNTER — Ambulatory Visit: Payer: 59 | Admitting: Podiatry

## 2017-11-11 ENCOUNTER — Ambulatory Visit: Payer: 59 | Admitting: Podiatry

## 2017-11-11 ENCOUNTER — Ambulatory Visit (INDEPENDENT_AMBULATORY_CARE_PROVIDER_SITE_OTHER): Payer: 59

## 2017-11-11 ENCOUNTER — Encounter: Payer: Self-pay | Admitting: Podiatry

## 2017-11-11 ENCOUNTER — Other Ambulatory Visit: Payer: Self-pay | Admitting: Podiatry

## 2017-11-11 VITALS — BP 114/77 | HR 79

## 2017-11-11 DIAGNOSIS — M79671 Pain in right foot: Secondary | ICD-10-CM

## 2017-11-11 DIAGNOSIS — M2042 Other hammer toe(s) (acquired), left foot: Secondary | ICD-10-CM | POA: Diagnosis not present

## 2017-11-11 DIAGNOSIS — M7751 Other enthesopathy of right foot: Secondary | ICD-10-CM | POA: Diagnosis not present

## 2017-11-11 DIAGNOSIS — G629 Polyneuropathy, unspecified: Secondary | ICD-10-CM | POA: Diagnosis not present

## 2017-11-11 DIAGNOSIS — M2041 Other hammer toe(s) (acquired), right foot: Secondary | ICD-10-CM

## 2017-11-11 DIAGNOSIS — M779 Enthesopathy, unspecified: Secondary | ICD-10-CM

## 2017-11-11 MED ORDER — TRIAMCINOLONE ACETONIDE 10 MG/ML IJ SUSP
10.0000 mg | Freq: Once | INTRAMUSCULAR | Status: AC
  Administered 2017-11-11: 10 mg

## 2017-11-11 NOTE — Progress Notes (Signed)
Subjective:   Patient ID: [name], female   DOB: 72 y.o.   MRN: [id number]   HPI Patient presents stating of been developing some discomfort in my second digit right foot without history of injury and is been going on about 3 months and it is hard for me to wear shoe gear with comfortably.  Patient states that it is at the inner phalangeal joint and it is sore when palpated.  Patient does not smoke and likes to be active and also states that she is been diagnosed with neuropathy  Review of Systems  All other systems reviewed and are negative.       Objective:  Physical Exam  Constitutional: She appears well-developed and well-nourished.  Cardiovascular: Intact distal pulses.  Pulmonary/Chest: Effort normal.  Musculoskeletal: Normal range of motion.  Neurological: She is alert.  Skin: Skin is warm.  Nursing note and vitals reviewed.   Neurovascular status intact with patient found to have inflammation around the second distal interphalangeal joint digit to right that is moderately painful when pressed and also has significant complaints of neurological issues with burning shooting pains in her feet moderate balance issues and numbness also noted.     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  For the neuropathy I am referring to physical therapy for neuro Genex treatment and also balance exercises and possibility in future for balance bracing if she should start to develop symptomatic falling  X-rays indicate that there is mild deviation of the digits but no advanced osteoarthritis next signed visit

## 2017-11-12 ENCOUNTER — Encounter: Payer: 59 | Admitting: Obstetrics & Gynecology

## 2017-11-19 ENCOUNTER — Encounter: Payer: Self-pay | Admitting: Obstetrics & Gynecology

## 2017-11-19 ENCOUNTER — Ambulatory Visit (INDEPENDENT_AMBULATORY_CARE_PROVIDER_SITE_OTHER): Payer: 59 | Admitting: Obstetrics & Gynecology

## 2017-11-19 VITALS — BP 118/76 | Ht 67.75 in | Wt 127.0 lb

## 2017-11-19 DIAGNOSIS — N941 Unspecified dyspareunia: Secondary | ICD-10-CM

## 2017-11-19 DIAGNOSIS — Z780 Asymptomatic menopausal state: Secondary | ICD-10-CM

## 2017-11-19 DIAGNOSIS — M8589 Other specified disorders of bone density and structure, multiple sites: Secondary | ICD-10-CM

## 2017-11-19 DIAGNOSIS — Z1272 Encounter for screening for malignant neoplasm of vagina: Secondary | ICD-10-CM

## 2017-11-19 DIAGNOSIS — Z01419 Encounter for gynecological examination (general) (routine) without abnormal findings: Secondary | ICD-10-CM | POA: Diagnosis not present

## 2017-11-19 NOTE — Progress Notes (Signed)
[name] March 26, 1945 [id number]   History:    72 y.o. G2 P2 L2 married  RP: Established patient presenting for annual gyn exam   HPI: S/P Total hysterectomy.  Menopause, well on no HRT.  No pelvic pain.  Urine/BMs wnl.  Breasts wnl.  BMI 19.45  Physically very active.  Health labs with Fam MD.  Past medical history,surgical history, family history and social history were all reviewed and documented in the EPIC chart.  Gynecologic History No LMP recorded. Patient has had a hysterectomy. Contraception: status post hysterectomy Last Pap: 11/2016. Results were: Negative Last mammogram: 06/2017. Results were: Benign Bone Density: 2017 Colonoscopy: 2012-3  Obstetric History OB History  Gravida Para Term Preterm AB Living  2 2       2   SAB TAB Ectopic Multiple Live Births               # Outcome Date GA Lbr Len/2nd Weight Sex Delivery Anes PTL Lv  2 Para           1 Para              ROS: A ROS was performed and pertinent positives and negatives are included in the history.  GENERAL: No fevers or chills. HEENT: No change in vision, no earache, sore throat or sinus congestion. NECK: No pain or stiffness. CARDIOVASCULAR: No chest pain or pressure. No palpitations. PULMONARY: No shortness of breath, cough or wheeze. GASTROINTESTINAL: No abdominal pain, nausea, vomiting or diarrhea, melena or bright red blood per rectum. GENITOURINARY: No urinary frequency, urgency, hesitancy or dysuria. MUSCULOSKELETAL: No joint or muscle pain, no back pain, no recent trauma. DERMATOLOGIC: No rash, no itching, no lesions. ENDOCRINE: No polyuria, polydipsia, no heat or cold intolerance. No recent change in weight. HEMATOLOGICAL: No anemia or easy bruising or bleeding. NEUROLOGIC: No headache, seizures, numbness, tingling or weakness. PSYCHIATRIC: No depression, no loss of interest in normal activity or change in sleep pattern.     Exam:   BP 118/76   Ht 5' 7.75" (1.721 m)   Wt 127  lb (57.6 kg)   BMI 19.45 kg/m   Body mass index is 19.45 kg/m.  General appearance : Well developed well nourished female. No acute distress HEENT: Eyes: no retinal hemorrhage or exudates,  Neck supple, trachea midline, no carotid bruits, no thyroidmegaly Lungs: Clear to auscultation, no rhonchi or wheezes, or rib retractions  Heart: Regular rate and rhythm, no murmurs or gallops Breast:Examined in sitting and supine position were symmetrical in appearance, no palpable masses or tenderness,  no skin retraction, no nipple inversion, no nipple discharge, no skin discoloration, no axillary or supraclavicular lymphadenopathy Abdomen: no palpable masses or tenderness, no rebound or guarding Extremities: no edema or skin discoloration or tenderness  Pelvic: Vulva: Normal             Vagina: No gross lesions or discharge.  Pap reflex done  Cervix/Uterus absent  Adnexa  Without masses or tenderness  Anus: Normal   Assessment/Plan:  72 y.o. female for annual exam   1. Encounter for Papanicolaou smear of vagina as part of routine gynecological examination Gynecologic exam status post total hysterectomy in menopause.  Pap reflex done on the vaginal vault.  Breast exam normal.  Screening mammogram benign in April 2019.  Health labs with family physician.  Colonoscopy in 2012 2013.  2. Postmenopause Well on no hormone replacement therapy.  3. Osteopenia of multiple sites Osteopenia on bone density in March 2017.  Patient will schedule here now.  Vitamin D supplements, calcium intake of 1.5 g/day including diet and supplements, regular weightbearing physical activity recommended. - DG Bone Density; Future  4. Female dyspareunia Improve on estradiol cream.  Will continue to use.  [name] MD, 10:59 AM 11/19/2017

## 2017-11-20 LAB — PAP IG W/ RFLX HPV ASCU

## 2017-11-21 ENCOUNTER — Encounter: Payer: Self-pay | Admitting: Obstetrics & Gynecology

## 2017-11-21 NOTE — Patient Instructions (Signed)
1. Encounter for Papanicolaou smear of vagina as part of routine gynecological examination Gynecologic exam status post total hysterectomy in menopause.  Pap reflex done on the vaginal vault.  Breast exam normal.  Screening mammogram benign in April 2019.  Health labs with family physician.  Colonoscopy in 2012 2013.  2. Postmenopause Well on no hormone replacement therapy.  3. Osteopenia of multiple sites Osteopenia on bone density in March 2017.  Patient will schedule here now.  Vitamin D supplements, calcium intake of 1.5 g/day including diet and supplements, regular weightbearing physical activity recommended. - DG Bone Density; Future  4. Female dyspareunia Improve on estradiol cream.  Will continue to use.  [name], it was a pleasure seeing you today!  I will inform you of your results as soon as they are available.

## 2017-12-16 ENCOUNTER — Ambulatory Visit (INDEPENDENT_AMBULATORY_CARE_PROVIDER_SITE_OTHER): Payer: 59

## 2017-12-16 ENCOUNTER — Other Ambulatory Visit: Payer: Self-pay | Admitting: Obstetrics & Gynecology

## 2017-12-16 DIAGNOSIS — M810 Age-related osteoporosis without current pathological fracture: Secondary | ICD-10-CM

## 2017-12-16 DIAGNOSIS — M8589 Other specified disorders of bone density and structure, multiple sites: Secondary | ICD-10-CM

## 2017-12-17 DIAGNOSIS — R7303 Prediabetes: Secondary | ICD-10-CM | POA: Insufficient documentation

## 2017-12-17 NOTE — Progress Notes (Signed)
Subjective:    Patient ID: [name], female    DOB: 1945-08-16, 72 y.o.   MRN: [id number]  HPI She is here for a physical exam.   There is still a lot of stress and anxiety.  She is taking the clonazepam as prescribed.  Her sleep is a little better and she sleeps some, but still does not sleep good.  It has stopped the vibration sensation in her chest.  She is decreased appetite and has lost more weight.  She does feel stressed all the time and know she has not been taking care of herself.  She wonders about seeing a therapist-the one she wanted to see is currently not accepting new patients.  She has been on SSRIs in the past, but they gave with are not worked or she had side effects.  She does not want to feel funny or not like herself if she takes medication.  For depression she has tried sertraline, prozac, cymbalta in the past.    Medications and allergies reviewed with patient and updated if appropriate.  Patient Active Problem List   Diagnosis Date Noted  . Prediabetes 12/17/2017  . Neck pain 04/28/2017  . Dizziness 02/26/2017  . B12 deficiency 12/16/2016  . Chest discomfort 05/07/2016  . Back pain 05/07/2016  . Asthma 04/22/2016  . Family history of diabetes mellitus (DM) 12/07/2015  . Constipation due to outlet dysfunction   . Obstructive sleep apnea 07/12/2015  . Seasonal and perennial allergic rhinitis 07/12/2015  . Vitamin D deficiency 12/05/2014  . Rhinitis, chronic 05/30/2014    Class: Chronic  . Vaginal atrophy 10/20/2012  . Insomnia 10/20/2012  . Orthostatic hypotension 02/16/2012  . Cough 02/13/2011  . Palpitations 12/31/2010  . GERD (gastroesophageal reflux disease) 07/25/2010  . Lymphocytic colitis 06/14/2010  . Gastroesophageal reflux disease with hiatal hernia   . BACK PAIN, CHRONIC 12/27/2009  . Osteopenia 12/27/2009  . SOB (shortness of breath) 06/25/2009  . Hereditary and idiopathic peripheral neuropathy 12/22/2007  .  Hyperlipidemia 03/09/2007  . Generalized anxiety disorder 03/09/2007  . MIGRAINE HEADACHE 03/09/2007  . IBS 03/09/2007  . SPONDYLOSIS 03/09/2007  . Myalgia and myositis 03/09/2007  . SCHATZKI'S RING, HX OF 03/09/2007  . Diaphragmatic hernia 12/30/2006  . GASTRITIS 02/17/2005  . DIVERTICULOSIS OF COLON 01/01/2000    Current Outpatient Medications on File Prior to Visit  Medication Sig Dispense Refill  . albuterol (PROVENTIL HFA;VENTOLIN HFA) 108 (90 Base) MCG/ACT inhaler Inhale 2 puffs into the lungs every 6 (six) hours as needed for wheezing or shortness of breath. 1 Inhaler 5  . Azelastine-Fluticasone (DYMISTA) 137-50 MCG/ACT SUSP Place 2 sprays into the nose daily.     [name] b complex vitamins capsule Take 1 capsule by mouth daily.    . Biotin 1000 MCG tablet Take 1,000 mcg by mouth daily.      . clonazePAM (KLONOPIN) 0.5 MG tablet Take 1 tablet (0.5 mg total) by mouth 2 (two) times daily. 60 tablet 1  . Magnesium Oxide 250 MG TABS Take 250 mg by mouth daily.     . pantoprazole (PROTONIX) 40 MG tablet Take 1 tablet (40 mg total) by mouth daily as needed. 30 tablet 3  . Probiotic Product (ALIGN) 4 MG CAPS Take 1 capsule by mouth daily.     [name] tretinoin (RETIN-A) 0.025 % cream APPLY 1 APPLICATION TO AFFECTED AREA AT BEDTIME  99  . vitamin C (ASCORBIC ACID) 500 MG tablet Take 500 mg by mouth daily.    . [  DISCONTINUED] colesevelam (WELCHOL) 625 MG tablet Take 625 mg by mouth 2 (two) times daily with a meal. Take one tablet by mouth twice a day    . [DISCONTINUED] Hyoscyamine-Phenyltoloxamine ([place] NF) [id number] MG CAPS Take one tablet by mouth as needed for abdominal cramping 24 each 0   No current facility-administered medications on file prior to visit.     Past Medical History:  Diagnosis Date  . Anxiety   . Complication of anesthesia   . Depression    generalized anxiety disorder  . Diverticulosis   . Duodenal diverticulum   . Family history of adverse reaction to anesthesia     Mother and Daughters- N/V  . Fibromyalgia   . Gastroesophageal reflux disease with hiatal hernia   . Hiatal hernia   . History of kidney stones   . Hyperlipidemia   . IBS (irritable bowel syndrome)   . Lymphocytic colitis    Dr Sharlett Iles  . Migraine headache   . Mitral valve prolapse   . Neuropathy   . Osteopenia    BMD ordered by GYN  . Peripheral neuropathy    treated as RLS by  Neurology  . PONV (postoperative nausea and vomiting)   . Restless leg syndrome   . Schatzki's ring    History of  . Sleep apnea    On CPAP, has not been using  . Spondylosis     Past Surgical History:  Procedure Laterality Date  . ANAL RECTAL MANOMETRY N/A 11/14/2015   Procedure: ANO RECTAL MANOMETRY;  Surgeon: Gatha Mayer, MD;  Location: [facility];  Service: Endoscopy;  Laterality: N/A;  . BREAST ENHANCEMENT SURGERY    . CATARACT EXTRACTION Bilateral   . cataract surgery Left    Dr Bing Plume  . CHOLECYSTECTOMY    . COLONOSCOPY    . ESOPHAGEAL DILATION     X 2  . ROTATOR CUFF REPAIR Left   . SINUS ENDO W/FUSION N/A 05/30/2014   Procedure: REVISION  FRONTAL SINUS SURGERY WITH FUSION SCAN;  Surgeon: Jerrell Belfast, MD;  Location: [place];  Service: ENT;  Laterality: N/A;  . SINUS SURGERY WITH INSTATRAK     x 2  . TUBAL LIGATION    . Vaginal cystectomy     x 2  . VAGINAL HYSTERECTOMY  05/2007   Vaginal repair, Dr Ubaldo Glassing.  Partial  hysterectomy.    Social History   Socioeconomic History  . Marital status: Married    Spouse name: Not on file  . Number of children: 2  . Years of education: 26  . Highest education level: Not on file  Occupational History  . Occupation: retired    Fish farm manager: AT [employer]  . Financial resource strain: Not on file  . Food insecurity:    Worry: Not on file    Inability: Not on file  . Transportation needs:    Medical: Not on file    Non-medical: Not on file  Tobacco Use  . Smoking status: Former Smoker    Packs/day: 0.50    Years: 15.00     Pack years: 7.50    Types: Cigarettes    Last attempt to quit: 03/03/1998    Years since quitting: 19.8  . Smokeless tobacco: Never Used  . Tobacco comment: smoked 1973- 2006, up to <  1  ppd  Substance and Sexual Activity  . Alcohol use: No    Alcohol/week: 0.0 standard drinks  . Drug use: No  . Sexual activity:  Yes    Partners: Male    Comment: 1st intercourse- 38, partners- 2, married- 67 yrs   Lifestyle  . Physical activity:    Days per week: Not on file    Minutes per session: Not on file  . Stress: Not on file  Relationships  . Social connections:    Talks on phone: Not on file    Gets together: Not on file    Attends religious service: Not on file    Active member of club or organization: Not on file    Attends meetings of clubs or organizations: Not on file    Relationship status: Not on file  Other Topics Concern  . Not on file  Social History Narrative   HAS REGULAR EXERCISE   DAILY CAFFEINE: 1-2 CUPS   Patient is right handed.    Family History  Problem Relation Age of Onset  . Hypertension Father 50  . Stroke Father 36  . Heart attack Father         ? in 46s  . Hypertension Mother   . Neuropathy Mother   . Anemia Mother   . Coronary artery disease Brother        Stent placement in 60s  . Diabetes Maternal Uncle   . Heart attack Paternal [place] , ? age  . Colon cancer Neg Hx   . Seizures Neg Hx     Review of Systems  Constitutional: Negative for chills and fever.  Eyes: Negative for visual disturbance.  Respiratory: Positive for shortness of breath. Negative for cough and wheezing.   Cardiovascular: Positive for palpitations. Negative for chest pain and leg swelling.  Gastrointestinal: Positive for diarrhea (related to stress). Negative for abdominal pain (with anxiety), blood in stool and nausea.  Genitourinary: Negative for dysuria and hematuria.  Musculoskeletal: Positive for arthralgias.  Skin: Negative for color change and rash.    Neurological: Positive for headaches (sometimes).  Psychiatric/Behavioral: Positive for dysphoric mood and sleep disturbance. The patient is nervous/anxious.        Objective:   Vitals:   12/18/17 0856  BP: 100/68  Pulse: 67  Resp: 16  Temp: 97.7 F (36.5 C)  SpO2: 96%   Filed Weights   12/18/17 0856  Weight: 122 lb 3.2 oz (55.4 kg)   Body mass index is 18.72 kg/m.  Wt Readings from Last 3 Encounters:  12/18/17 122 lb 3.2 oz (55.4 kg)  11/19/17 127 lb (57.6 kg)  10/20/17 128 lb (58.1 kg)     Physical Exam Constitutional: She appears well-developed and well-nourished. No distress.  HENT:  Head: Normocephalic and atraumatic.  Right Ear: External ear normal. Normal ear canal and TM Left Ear: External ear normal.  Normal ear canal and TM Mouth/Throat: Oropharynx is clear and moist.  Eyes: Conjunctivae and EOM are normal.  Neck: Neck supple. No tracheal deviation present. No thyromegaly present.  No carotid bruit  Cardiovascular: Normal rate, regular rhythm and normal heart sounds.   No murmur heard.  No edema. Pulmonary/Chest: Effort normal and breath sounds normal. No respiratory distress. She has no wheezes. She has no rales.  Breast: deferred to Gyn Abdominal: Soft. She exhibits no distension. There is no tenderness.  Lymphadenopathy: She has no cervical adenopathy.  Skin: Skin is warm and dry. She is not diaphoretic.  Psychiatric: She has a normal mood and affect. Her behavior is normal.        Assessment & Plan:  Physical exam: Screening blood work  ordered Immunizations   Flu vaccine today, tdap due, others up to date Colonoscopy    Up to date  Mammogram    Up to date  Gyn   Up to date  Dexa   Done recently Eye exams      Up to date  EKG    Done 10/2017 Exercise  Doing some yard work - no other exercise Weight  Normal BMI Skin   No concerns Substance abuse  none  See Problem List for Assessment and Plan of chronic medical problems.   FU in 6  weeks

## 2017-12-17 NOTE — Patient Instructions (Addendum)
Take 2000 units of vitamin D daily.  Take 1000 mcg of vitamin B12 daily.   Tests ordered today. Your results will be released to [place] (or called to you) after review, usually within 72hours after test completion. If any changes need to be made, you will be notified at that same time.  All other Health Maintenance issues reviewed.   All recommended immunizations and age-appropriate screenings are up-to-date or discussed.  Flu immunization administered today.    Medications reviewed and updated.  Changes include :   Start pristiq 50 mg daily  Your prescription(s) have been submitted to your pharmacy. Please take as directed and contact our office if you believe you are having problem(s) with the medication(s).   Please followup in 6 months   Health Maintenance, Female Adopting a healthy lifestyle and getting preventive care can go a long way to promote health and wellness. Talk with your health care provider about what schedule of regular examinations is right for you. This is a good chance for you to check in with your provider about disease prevention and staying healthy. In between checkups, there are plenty of things you can do on your own. Experts have done a lot of research about which lifestyle changes and preventive measures are most likely to keep you healthy. Ask your health care provider for more information. Weight and diet Eat a healthy diet  Be sure to include plenty of vegetables, fruits, low-fat dairy products, and lean protein.  Do not eat a lot of foods high in solid fats, added sugars, or salt.  Get regular exercise. This is one of the most important things you can do for your health. ? Most adults should exercise for at least 150 minutes each week. The exercise should increase your heart rate and make you sweat (moderate-intensity exercise). ? Most adults should also do strengthening exercises at least twice a week. This is in addition to the moderate-intensity  exercise.  Maintain a healthy weight  Body mass index (BMI) is a measurement that can be used to identify possible weight problems. It estimates body fat based on height and weight. Your health care provider can help determine your BMI and help you achieve or maintain a healthy weight.  For females 74 years of age and older: ? A BMI below 18.5 is considered underweight. ? A BMI of 18.5 to 24.9 is normal. ? A BMI of 25 to 29.9 is considered overweight. ? A BMI of 30 and above is considered obese.  Watch levels of cholesterol and blood lipids  You should start having your blood tested for lipids and cholesterol at 74 years of age, then have this test every 5 years.  You may need to have your cholesterol levels checked more often if: ? Your lipid or cholesterol levels are high. ? You are older than 74 years of age. ? You are at high risk for heart disease.  Cancer screening Lung Cancer  Lung cancer screening is recommended for adults 74-86 years old who are at high risk for lung cancer because of a history of smoking.  A yearly low-dose CT scan of the lungs is recommended for people who: ? Currently smoke. ? Have quit within the past 15 years. ? Have at least a 30-pack-year history of smoking. A pack year is smoking an average of one pack of cigarettes a day for 1 year.  Yearly screening should continue until it has been 15 years since you quit.  Yearly screening should stop  if you develop a health problem that would prevent you from having lung cancer treatment.  Breast Cancer  Practice breast self-awareness. This means understanding how your breasts normally appear and feel.  It also means doing regular breast self-exams. Let your health care provider know about any changes, no matter how small.  If you are in your 74s or 30s, you should have a clinical breast exam (CBE) by a health care provider every 1-3 years as part of a regular health exam.  If you are 74 or older, have  a CBE every year. Also consider having a breast X-ray (mammogram) every year.  If you have a family history of breast cancer, talk to your health care provider about genetic screening.  If you are at high risk for breast cancer, talk to your health care provider about having an MRI and a mammogram every year.  Breast cancer gene (BRCA) assessment is recommended for women who have family members with BRCA-related cancers. BRCA-related cancers include: ? Breast. ? Ovarian. ? Tubal. ? Peritoneal cancers.  Results of the assessment will determine the need for genetic counseling and BRCA1 and BRCA2 testing.  Cervical Cancer Your health care provider may recommend that you be screened regularly for cancer of the pelvic organs (ovaries, uterus, and vagina). This screening involves a pelvic examination, including checking for microscopic changes to the surface of your cervix (Pap test). You may be encouraged to have this screening done every 3 years, beginning at age 74.  For women ages 61-65, health care providers may recommend pelvic exams and Pap testing every 3 years, or they may recommend the Pap and pelvic exam, combined with testing for human papilloma virus (HPV), every 5 years. Some types of HPV increase your risk of cervical cancer. Testing for HPV may also be done on women of any age with unclear Pap test results.  Other health care providers may not recommend any screening for nonpregnant women who are considered low risk for pelvic cancer and who do not have symptoms. Ask your health care provider if a screening pelvic exam is right for you.  If you have had past treatment for cervical cancer or a condition that could lead to cancer, you need Pap tests and screening for cancer for at least 20 years after your treatment. If Pap tests have been discontinued, your risk factors (such as having a new sexual partner) need to be reassessed to determine if screening should resume. Some women have  medical problems that increase the chance of getting cervical cancer. In these cases, your health care provider may recommend more frequent screening and Pap tests.  Colorectal Cancer  This type of cancer can be detected and often prevented.  Routine colorectal cancer screening usually begins at 74 years of age and continues through 74 years of age.  Your health care provider may recommend screening at an earlier age if you have risk factors for colon cancer.  Your health care provider may also recommend using home test kits to check for hidden blood in the stool.  A small camera at the end of a tube can be used to examine your colon directly (sigmoidoscopy or colonoscopy). This is done to check for the earliest forms of colorectal cancer.  Routine screening usually begins at age 61.  Direct examination of the colon should be repeated every 5-10 years through 74 years of age. However, you may need to be screened more often if early forms of precancerous polyps or small growths  are found.  Skin Cancer  Check your skin from head to toe regularly.  Tell your health care provider about any new moles or changes in moles, especially if there is a change in a mole's shape or color.  Also tell your health care provider if you have a mole that is larger than the size of a pencil eraser.  Always use sunscreen. Apply sunscreen liberally and repeatedly throughout the day.  Protect yourself by wearing long sleeves, pants, a wide-brimmed hat, and sunglasses whenever you are outside.  Heart disease, diabetes, and high blood pressure  High blood pressure causes heart disease and increases the risk of stroke. High blood pressure is more likely to develop in: ? People who have blood pressure in the high end of the normal range (130-139/85-89 mm Hg). ? People who are overweight or obese. ? People who are African American.  If you are 29-8 years of age, have your blood pressure checked every 3-5  years. If you are 74 years of age or older, have your blood pressure checked every year. You should have your blood pressure measured twice-once when you are at a hospital or clinic, and once when you are not at a hospital or clinic. Record the average of the two measurements. To check your blood pressure when you are not at a hospital or clinic, you can use: ? An automated blood pressure machine at a pharmacy. ? A home blood pressure monitor.  If you are between 46 years and 28 years old, ask your health care provider if you should take aspirin to prevent strokes.  Have regular diabetes screenings. This involves taking a blood sample to check your fasting blood sugar level. ? If you are at a normal weight and have a low risk for diabetes, have this test once every three years after 74 years of age. ? If you are overweight and have a high risk for diabetes, consider being tested at a younger age or more often. Preventing infection Hepatitis B  If you have a higher risk for hepatitis B, you should be screened for this virus. You are considered at high risk for hepatitis B if: ? You were born in a country where hepatitis B is common. Ask your health care provider which countries are considered high risk. ? Your parents were born in a high-risk country, and you have not been immunized against hepatitis B (hepatitis B vaccine). ? You have HIV or AIDS. ? You use needles to inject street drugs. ? You live with someone who has hepatitis B. ? You have had sex with someone who has hepatitis B. ? You get hemodialysis treatment. ? You take certain medicines for conditions, including cancer, organ transplantation, and autoimmune conditions.  Hepatitis C  Blood testing is recommended for: ? Everyone born from [birth year] through 1967. ? Anyone with known risk factors for hepatitis C.  Sexually transmitted infections (STIs)  You should be screened for sexually transmitted infections (STIs) including  gonorrhea and chlamydia if: ? You are sexually active and are younger than 74 years of age. ? You are older than 74 years of age and your health care provider tells you that you are at risk for this type of infection. ? Your sexual activity has changed since you were last screened and you are at an increased risk for chlamydia or gonorrhea. Ask your health care provider if you are at risk.  If you do not have HIV, but are at risk, it may be  recommended that you take a prescription medicine daily to prevent HIV infection. This is called pre-exposure prophylaxis (PrEP). You are considered at risk if: ? You are sexually active and do not regularly use condoms or know the HIV status of your partner(s). ? You take drugs by injection. ? You are sexually active with a partner who has HIV.  Talk with your health care provider about whether you are at high risk of being infected with HIV. If you choose to begin PrEP, you should first be tested for HIV. You should then be tested every 3 months for as long as you are taking PrEP. Pregnancy  If you are premenopausal and you may become pregnant, ask your health care provider about preconception counseling.  If you may become pregnant, take 400 to 800 micrograms (mcg) of folic acid every day.  If you want to prevent pregnancy, talk to your health care provider about birth control (contraception). Osteoporosis and menopause  Osteoporosis is a disease in which the bones lose minerals and strength with aging. This can result in serious bone fractures. Your risk for osteoporosis can be identified using a bone density scan.  If you are 76 years of age or older, or if you are at risk for osteoporosis and fractures, ask your health care provider if you should be screened.  Ask your health care provider whether you should take a calcium or vitamin D supplement to lower your risk for osteoporosis.  Menopause may have certain physical symptoms and  risks.  Hormone replacement therapy may reduce some of these symptoms and risks. Talk to your health care provider about whether hormone replacement therapy is right for you. Follow these instructions at home:  Schedule regular health, dental, and eye exams.  Stay current with your immunizations.  Do not use any tobacco products including cigarettes, chewing tobacco, or electronic cigarettes.  If you are pregnant, do not drink alcohol.  If you are breastfeeding, limit how much and how often you drink alcohol.  Limit alcohol intake to no more than 1 drink per day for nonpregnant women. One drink equals 12 ounces of beer, 5 ounces of wine, or 1 ounces of hard liquor.  Do not use street drugs.  Do not share needles.  Ask your health care provider for help if you need support or information about quitting drugs.  Tell your health care provider if you often feel depressed.  Tell your health care provider if you have ever been abused or do not feel safe at home. This information is not intended to replace advice given to you by your health care provider. Make sure you discuss any questions you have with your health care provider. Document Released: 09/02/2010 Document Revised: 07/26/2015 Document Reviewed: 11/21/2014 Elsevier Interactive Patient Education  [employer].

## 2017-12-18 ENCOUNTER — Encounter: Payer: Self-pay | Admitting: Internal Medicine

## 2017-12-18 ENCOUNTER — Ambulatory Visit (INDEPENDENT_AMBULATORY_CARE_PROVIDER_SITE_OTHER): Payer: 59 | Admitting: Internal Medicine

## 2017-12-18 ENCOUNTER — Other Ambulatory Visit (INDEPENDENT_AMBULATORY_CARE_PROVIDER_SITE_OTHER): Payer: 59

## 2017-12-18 VITALS — BP 100/68 | HR 67 | Temp 97.7°F | Resp 16 | Ht 67.75 in | Wt 122.2 lb

## 2017-12-18 DIAGNOSIS — E559 Vitamin D deficiency, unspecified: Secondary | ICD-10-CM

## 2017-12-18 DIAGNOSIS — Z23 Encounter for immunization: Secondary | ICD-10-CM

## 2017-12-18 DIAGNOSIS — F3289 Other specified depressive episodes: Secondary | ICD-10-CM

## 2017-12-18 DIAGNOSIS — E538 Deficiency of other specified B group vitamins: Secondary | ICD-10-CM | POA: Diagnosis not present

## 2017-12-18 DIAGNOSIS — K219 Gastro-esophageal reflux disease without esophagitis: Secondary | ICD-10-CM | POA: Diagnosis not present

## 2017-12-18 DIAGNOSIS — R7303 Prediabetes: Secondary | ICD-10-CM

## 2017-12-18 DIAGNOSIS — K449 Diaphragmatic hernia without obstruction or gangrene: Secondary | ICD-10-CM

## 2017-12-18 DIAGNOSIS — E782 Mixed hyperlipidemia: Secondary | ICD-10-CM | POA: Diagnosis not present

## 2017-12-18 DIAGNOSIS — Z0000 Encounter for general adult medical examination without abnormal findings: Secondary | ICD-10-CM

## 2017-12-18 DIAGNOSIS — F411 Generalized anxiety disorder: Secondary | ICD-10-CM

## 2017-12-18 LAB — CBC WITH DIFFERENTIAL/PLATELET
BASOS ABS: 0.1 10*3/uL (ref 0.0–0.1)
BASOS PCT: 1.1 % (ref 0.0–3.0)
EOS ABS: 0 10*3/uL (ref 0.0–0.7)
Eosinophils Relative: 0.7 % (ref 0.0–5.0)
HCT: 42.2 % (ref 36.0–46.0)
HEMOGLOBIN: 14.1 g/dL (ref 12.0–15.0)
Lymphocytes Relative: 27.1 % (ref 12.0–46.0)
Lymphs Abs: 1.5 10*3/uL (ref 0.7–4.0)
MCHC: 33.5 g/dL (ref 30.0–36.0)
MCV: 90.2 fl (ref 78.0–100.0)
MONO ABS: 0.5 10*3/uL (ref 0.1–1.0)
Monocytes Relative: 9.1 % (ref 3.0–12.0)
NEUTROS PCT: 62 % (ref 43.0–77.0)
Neutro Abs: 3.4 10*3/uL (ref 1.4–7.7)
Platelets: 243 10*3/uL (ref 150.0–400.0)
RBC: 4.68 Mil/uL (ref 3.87–5.11)
RDW: 14.7 % (ref 11.5–15.5)
WBC: 5.4 10*3/uL (ref 4.0–10.5)

## 2017-12-18 LAB — COMPREHENSIVE METABOLIC PANEL
ALBUMIN: 4.4 g/dL (ref 3.5–5.2)
ALK PHOS: 53 U/L (ref 39–117)
ALT: 8 U/L (ref 0–35)
AST: 14 U/L (ref 0–37)
BILIRUBIN TOTAL: 0.6 mg/dL (ref 0.2–1.2)
BUN: 11 mg/dL (ref 6–23)
CHLORIDE: 105 meq/L (ref 96–112)
CO2: 26 mEq/L (ref 19–32)
CREATININE: 0.69 mg/dL (ref 0.40–1.20)
Calcium: 9.8 mg/dL (ref 8.4–10.5)
GFR: 88.44 mL/min (ref >60.00)
Glucose, Bld: 97 mg/dL (ref 70–99)
Potassium: 4.4 mEq/L (ref 3.5–5.1)
SODIUM: 140 meq/L (ref 135–145)
TOTAL PROTEIN: 6.9 g/dL (ref 6.0–8.3)

## 2017-12-18 LAB — VITAMIN D 25 HYDROXY (VIT D DEFICIENCY, FRACTURES): VITD: 30.94 ng/mL (ref 30.00–100.00)

## 2017-12-18 LAB — LIPID PANEL
CHOLESTEROL: 212 mg/dL — AB (ref 0–200)
HDL: 65.9 mg/dL (ref >39.00)
LDL Cholesterol: 130 mg/dL — ABNORMAL HIGH (ref 0–99)
NonHDL: 146.37
Total CHOL/HDL Ratio: 3
Triglycerides: 82 mg/dL (ref 0.0–149.0)
VLDL: 16.4 mg/dL (ref 0.0–40.0)

## 2017-12-18 LAB — VITAMIN B12: Vitamin B-12: 282 pg/mL (ref 211–911)

## 2017-12-18 LAB — HEMOGLOBIN A1C: Hgb A1c MFr Bld: 5.7 % (ref 4.6–6.5)

## 2017-12-18 LAB — TSH: TSH: 3.48 u[IU]/mL (ref 0.35–4.50)

## 2017-12-18 MED ORDER — DESVENLAFAXINE SUCCINATE ER 50 MG PO TB24: 50.0000 mg | ORAL_TABLET | Freq: Every day | ORAL | 5 refills | Status: DC

## 2017-12-18 NOTE — Assessment & Plan Note (Signed)
GERD controlled Continue daily medication  

## 2017-12-18 NOTE — Assessment & Plan Note (Signed)
Currently depressed Has tried Cymbalta, sertraline and Prozac in the past and did not tolerate them-did not feel like herself We will try Pristiq 50 mg daily-she states she will try something else if this does not work Stressed the importance of trying a couple of different medications because I think she needs medication at this point Recommended seeing the therapist

## 2017-12-18 NOTE — Assessment & Plan Note (Signed)
Check lipid panel, CMP Not currently on medication Encourage regular exercise and heart healthy diet

## 2017-12-18 NOTE — Assessment & Plan Note (Signed)
Generalized anxiety-not currently controlled Continue clonazepam twice daily Has tried a few medications in the past that she has not tolerated including sertraline, fluoxetine and Cymbalta Start Pristiq 50 mg daily Recommended seeing a therapist

## 2017-12-18 NOTE — Assessment & Plan Note (Signed)
Not taking B12 consistently Will check level  Advised taking the medication daily

## 2017-12-18 NOTE — Assessment & Plan Note (Signed)
Not taking vitamin D daily Will check level Stressed taking it on a regular basis

## 2017-12-18 NOTE — Assessment & Plan Note (Signed)
Check A1c Low sugar/carbohydrate diet Encourage regular exercise 

## 2017-12-23 ENCOUNTER — Ambulatory Visit: Payer: 59 | Admitting: Obstetrics & Gynecology

## 2017-12-23 ENCOUNTER — Telehealth: Payer: Self-pay | Admitting: *Deleted

## 2017-12-23 ENCOUNTER — Encounter: Payer: Self-pay | Admitting: Obstetrics & Gynecology

## 2017-12-23 VITALS — BP 136/84

## 2017-12-23 DIAGNOSIS — M810 Age-related osteoporosis without current pathological fracture: Secondary | ICD-10-CM

## 2017-12-23 NOTE — Patient Instructions (Signed)
1. Age-related osteoporosis without current pathological fracture Osteoporosis with a T score of -2.5 at the spine.  No history of fragility fracture.  Has been on Fosamax many years ago.  Was not taking vitamin D supplements and her recent vitamin D level was at the lower limit of normal at 30.94.  Will start vitamin D supplements of 1000 international unit/day.  Recommend calcium intake of 1.5 g/day, could take 500 mg in her nutrition and 1000 mg of supplements.  Recommend weightbearing physical activity such as walking every day and weightlifting every 2 days.  Decision to start on Prolia.  Usage, risks and benefits of Prolia reviewed and pamphlet given.  Will add information on osteoporosis, nutrition and treatment to the summary.   Preventing Osteoporosis, Adult Osteoporosis is a condition that causes the bones to get weaker. With osteoporosis, the bones become thinner, and the normal spaces in bone tissue become larger. This can make the bones weak and cause them to break more easily. People who have osteoporosis are more likely to break their wrist, spine, or hip. Even a minor accident or injury can be enough to break weak bones. Osteoporosis can occur with aging. Your body constantly replaces old bone tissue with new tissue. As you get older, you may lose bone tissue more quickly, or it may be replaced more slowly. Osteoporosis is more likely to develop if you have poor nutrition or do not get enough calcium or vitamin D. Other lifestyle factors can also play a role. By making some diet and lifestyle changes, you can help to keep your bones healthy and help to prevent osteoporosis. What nutrition changes can be made? Nutrition plays an important role in maintaining healthy, strong bones. Make sure you get enough calcium every day from food or from calcium supplements. If you are age 57 or younger, aim to get 1,000 mg of calcium every day. If you are older than age 80, aim to get 1,200 mg of  calcium every day. Try to get enough vitamin D every day. If you are age 84 or younger, aim to get 600 international units (IU) every day. If you are older than age 45, aim to get 800 international units every day. Follow a healthy diet. Eat plenty of foods that contain calcium and vitamin D. Calcium is in milk, cheese, yogurt, and other dairy products. Some fish and vegetables are also good sources of calcium. Many foods such as cereals and breads have had calcium added to them (are fortified). Check nutrition labels to see how much calcium is in a food or drink. Foods that contain vitamin D include milk, cereals, salmon, and tuna. Your body also makes vitamin D when you are out in the sun. Bare skin exposure to the sun on your face, arms, legs, or back for no more than 30 minutes a day, 2 times per week is more than enough. Beyond that, it is important to use sunblock to protect your skin from sunburn, which increases your risk for skin cancer.  What lifestyle changes can be made? Making changes in your everyday life can also play an important role in preventing osteoporosis. Stay active and get exercise every day. Ask your health care provider what types of exercise are best for you. Do not use any products that contain nicotine or tobacco, such as cigarettes and e-cigarettes. If you need help quitting, ask your health care provider. Limit alcohol intake to no more than 1 drink a day for nonpregnant women and 2  drinks a day for men. One drink equals 12 oz of beer, 5 oz of wine, or 1 oz of hard liquor.  Why are these changes important? Making these nutrition and lifestyle changes can: Help you develop and maintain healthy, strong bones. Prevent loss of bone mass and the problems that are caused by that loss, such as broken bones and delayed healing. Make you feel better mentally and physically.  What can happen if changes are not made? Problems that can result from osteoporosis can be very  serious. These may include: A higher risk of broken bones that are painful and do not heal well. Physical malformations, such as a collapsed spine or a hunched back. Problems with movement.  Where to find support: If you need help making changes to prevent osteoporosis, talk with your health care provider. You can ask for a referral to a diet and nutrition specialist (dietitian) and a physical therapist. Where to find more information: Learn more about osteoporosis from: NIH Osteoporosis and Related [place]: www.niams.GolfingGoddess.com.br U.S. Office on Women's Health: SouvenirBaseball.es.html National Osteoporosis Foundation: ProfilePeek.ch  Summary Osteoporosis is a condition that causes weak bones that are more likely to break. Eating a healthy diet and making sure you get enough calcium and vitamin D can help prevent osteoporosis. Other ways to reduce your risk of osteoporosis include getting regular exercise and avoiding alcohol and products that contain nicotine or tobacco. This information is not intended to replace advice given to you by your health care provider. Make sure you discuss any questions you have with your health care provider. Document Released: 03/04/2015 Document Revised: 10/29/2015 Document Reviewed: 10/29/2015 Elsevier Interactive Patient Education  2018 [employer].  Osteoporosis Osteoporosis is the thinning and loss of density in the bones. Osteoporosis makes the bones more brittle, fragile, and likely to break (fracture). Over time, osteoporosis can cause the bones to become so weak that they fracture after a simple fall. The bones most likely to fracture are the bones in the hip, wrist, and spine. What are the causes? The exact cause is not known. What increases the risk? Anyone can develop osteoporosis. You may  be at greater risk if you have a family history of the condition or have poor nutrition. You may also have a higher risk if you are:  Female.  32 years old or older.  A smoker.  Not physically active.  White or Asian.  Slender.  What are the signs or symptoms? A fracture might be the first sign of the disease, especially if it results from a fall or injury that would not usually cause a bone to break. Other signs and symptoms include:  Low back and neck pain.  Stooped posture.  Height loss.  How is this diagnosed? To make a diagnosis, your health care provider may:  Take a medical history.  Perform a physical exam.  Order tests, such as: ? A bone mineral density test. ? A dual-energy X-ray absorptiometry test.  How is this treated? The goal of osteoporosis treatment is to strengthen your bones to reduce your risk of a fracture. Treatment may involve:  Making lifestyle changes, such as: ? Eating a diet rich in calcium. ? Doing weight-bearing and muscle-strengthening exercises. ? Stopping tobacco use. ? Limiting alcohol intake.  Taking medicine to slow the process of bone loss or to increase bone density.  Monitoring your levels of calcium and vitamin D.  Follow these instructions at home:  Include calcium and vitamin D in  your diet. Calcium is important for bone health, and vitamin D helps the body absorb calcium.  Perform weight-bearing and muscle-strengthening exercises as directed by your health care provider.  Do not use any tobacco products, including cigarettes, chewing tobacco, and electronic cigarettes. If you need help quitting, ask your health care provider.  Limit your alcohol intake.  Take medicines only as directed by your health care provider.  Keep all follow-up visits as directed by your health care provider. This is important.  Take precautions at home to lower your risk of falling, such as: ? Keeping rooms well lit and clutter  free. ? Installing safety rails on stairs. ? Using rubber mats in the bathroom and other areas that are often wet or slippery. Get help right away if: You fall or injure yourself. This information is not intended to replace advice given to you by your health care provider. Make sure you discuss any questions you have with your health care provider. Document Released: 11/27/2004 Document Revised: 07/23/2015 Document Reviewed: 07/28/2013 Elsevier Interactive Patient Education  [employer].

## 2017-12-23 NOTE — Progress Notes (Signed)
    [name] May 06, 1945 [id number]        72 y.o.  G2P2L2  RP: Osteoporosis counseling and management  HPI: No h/o fracture.  H/O Osteoporosis on Fosamax many years ago.     OB History  Gravida Para Term Preterm AB Living  2 2       2   SAB TAB Ectopic Multiple Live Births               # Outcome Date GA Lbr Len/2nd Weight Sex Delivery Anes PTL Lv  2 Para           1 Para             Past medical history,surgical history, problem list, medications, allergies, family history and social history were all reviewed and documented in the EPIC chart.   Directed ROS with pertinent positives and negatives documented in the history of present illness/assessment and plan.  Exam:  Vitals:   12/23/17 0936  BP: 136/84   General appearance:  Normal  Bone Density 12/16/2017:  Osteoporosis T-Score -2.5 at the Spine  Vit D 12/18/2017 at 30.94 (lower side of normal), on no Vit D supplement   Assessment/Plan:  72 y.o. G2P2   1. Age-related osteoporosis without current pathological fracture Osteoporosis with a T score of -2.5 at the spine.  No history of fragility fracture.  Has been on Fosamax many years ago.  Was not taking vitamin D supplements and her recent vitamin D level was at the lower limit of normal at 30.94.  Will start vitamin D supplements of 1000 international unit/day.  Recommend calcium intake of 1.5 g/day, could take 500 mg in her nutrition and 1000 mg of supplements.  Recommend weightbearing physical activity such as walking every day and weightlifting every 2 days.  Decision to start on Prolia.  Usage, risks and benefits of Prolia reviewed and pamphlet given.  Will add information on osteoporosis, nutrition and treatment to the summary.  Counseling on above issues and coordination of care more than 50% for 25 minutes.  [name] MD, 10:04 AM 12/23/2017

## 2017-12-23 NOTE — Telephone Encounter (Signed)
-----   Message from Catha Brow sent at 12/23/2017 12:36 PM EDT ----- Regarding: FW: Start on Prolia   ----- Message ----- From: [name], MD Sent: 12/23/2017  10:25 AM EDT To: [facility] [place] Subject: Start on Prolia                                Osteoporosis.  On Fosamax in the past.  Start on Prolia.  Ca++ 9.8 12/18/2017.

## 2017-12-24 NOTE — Telephone Encounter (Signed)
Pt called to cancel Prolia appt  Pt states there's side effects to this medication that raises some concern. Would like to talk to Primary care MD before proceeding.I also offered her an appt to come in for further discussion of treatment options. Pt declined   Will route to Dr. Dellis Filbert so she's aware.

## 2017-12-24 NOTE — Telephone Encounter (Signed)
Deductible amount met   OOP MAX $565mt ($1378m)  Annual exam 11/19/17 ml  Calcium 9.8            Date 12/18/17  Upcoming dental procedures NO  Prior Authorization needed NO  Pt estimated Cost $0    APPT 12/25/17 @12    Coverage Details:$0 one dose,$0 admin fee

## 2017-12-25 ENCOUNTER — Ambulatory Visit: Payer: 59

## 2018-01-12 ENCOUNTER — Ambulatory Visit: Payer: 59 | Admitting: Obstetrics & Gynecology

## 2018-01-31 NOTE — Progress Notes (Signed)
Subjective:    Patient ID: [name], female    DOB: 1945-04-21, 72 y.o.   MRN: [id number]  HPI The patient is here for follow up of anxiety and depression.  We started pristiq 6 weeks ago, but she does not recall it being prescribed.    Depression: She is not taking the pristiq - she did not know it was prescribed.   She still feels depressed.   Anxiety: She never got the Pristiq prescription from the pharmacy.  She takes the clonazepam a little before bedtime and occasionally takes 1/2 of a pill during the day if needed.  She denies any side effects from the clonazepam and it works well. She feels her anxiety is not ideally controlled, but better.  There is still a lot going on.     Osteoporosis:  She had a dexa in October at her Gyn's office.  She is taking Calcium daily and will start Vitamin d daily.  She is active, but not exercising.  She wants to know more about medications, but ideally does not want to take any.  She is concerned about possible side effects.    Medications and allergies reviewed with patient and updated if appropriate.  Patient Active Problem List   Diagnosis Date Noted  . Prediabetes 12/17/2017  . Neck pain 04/28/2017  . Dizziness 02/26/2017  . B12 deficiency 12/16/2016  . Chest discomfort 05/07/2016  . Back pain 05/07/2016  . Asthma 04/22/2016  . Family history of diabetes mellitus (DM) 12/07/2015  . Constipation due to outlet dysfunction   . Obstructive sleep apnea 07/12/2015  . Seasonal and perennial allergic rhinitis 07/12/2015  . Vitamin D deficiency 12/05/2014  . Rhinitis, chronic 05/30/2014    Class: Chronic  . Vaginal atrophy 10/20/2012  . Insomnia 10/20/2012  . Orthostatic hypotension 02/16/2012  . Cough 02/13/2011  . Palpitations 12/31/2010  . GERD (gastroesophageal reflux disease) 07/25/2010  . Lymphocytic colitis 06/14/2010  . Depression   . BACK PAIN, CHRONIC 12/27/2009  . Osteoporosis 12/27/2009  . SOB  (shortness of breath) 06/25/2009  . Hereditary and idiopathic peripheral neuropathy 12/22/2007  . Hyperlipidemia 03/09/2007  . Generalized anxiety disorder 03/09/2007  . MIGRAINE HEADACHE 03/09/2007  . IBS 03/09/2007  . SPONDYLOSIS 03/09/2007  . Myalgia and myositis 03/09/2007  . SCHATZKI'S RING, HX OF 03/09/2007  . Diaphragmatic hernia 12/30/2006  . GASTRITIS 02/17/2005  . DIVERTICULOSIS OF COLON 01/01/2000    Current Outpatient Medications on File Prior to Visit  Medication Sig Dispense Refill  . albuterol (PROVENTIL HFA;VENTOLIN HFA) 108 (90 Base) MCG/ACT inhaler Inhale 2 puffs into the lungs every 6 (six) hours as needed for wheezing or shortness of breath. 1 Inhaler 5  . Azelastine-Fluticasone (DYMISTA) 137-50 MCG/ACT SUSP Place 2 sprays into the nose daily.     [name] b complex vitamins capsule Take 1 capsule by mouth daily.    . Biotin 1000 MCG tablet Take 1,000 mcg by mouth daily.      . Magnesium Oxide 250 MG TABS Take 250 mg by mouth daily.     . pantoprazole (PROTONIX) 40 MG tablet Take 1 tablet (40 mg total) by mouth daily as needed. 30 tablet 3  . Probiotic Product (ALIGN) 4 MG CAPS Take 1 capsule by mouth daily.     [name] tretinoin (RETIN-A) 0.025 % cream APPLY 1 APPLICATION TO AFFECTED AREA AT BEDTIME  99  . vitamin C (ASCORBIC ACID) 500 MG tablet Take 500 mg by mouth daily.    . [  DISCONTINUED] colesevelam (WELCHOL) 625 MG tablet Take 625 mg by mouth 2 (two) times daily with a meal. Take one tablet by mouth twice a day    . [DISCONTINUED] Hyoscyamine-Phenyltoloxamine ([place] NF) [id number] MG CAPS Take one tablet by mouth as needed for abdominal cramping 24 each 0   No current facility-administered medications on file prior to visit.     Past Medical History:  Diagnosis Date  . Anxiety   . Complication of anesthesia   . Depression    generalized anxiety disorder  . Diverticulosis   . Duodenal diverticulum   . Family history of adverse reaction to anesthesia    Mother and  Daughters- N/V  . Fibromyalgia   . Gastroesophageal reflux disease with hiatal hernia   . Hiatal hernia   . History of kidney stones   . Hyperlipidemia   . IBS (irritable bowel syndrome)   . Lymphocytic colitis    Dr Sharlett Iles  . Migraine headache   . Mitral valve prolapse   . Neuropathy   . Osteopenia    BMD ordered by GYN  . Peripheral neuropathy    treated as RLS by  Neurology  . PONV (postoperative nausea and vomiting)   . Restless leg syndrome   . Schatzki's ring    History of  . Sleep apnea    On CPAP, has not been using  . Spondylosis     Past Surgical History:  Procedure Laterality Date  . ANAL RECTAL MANOMETRY N/A 11/14/2015   Procedure: ANO RECTAL MANOMETRY;  Surgeon: Gatha Mayer, MD;  Location: [facility];  Service: Endoscopy;  Laterality: N/A;  . BREAST ENHANCEMENT SURGERY    . CATARACT EXTRACTION Bilateral   . cataract surgery Left    Dr Bing Plume  . CHOLECYSTECTOMY    . COLONOSCOPY    . ESOPHAGEAL DILATION     X 2  . ROTATOR CUFF REPAIR Left   . SINUS ENDO W/FUSION N/A 05/30/2014   Procedure: REVISION  FRONTAL SINUS SURGERY WITH FUSION SCAN;  Surgeon: Jerrell Belfast, MD;  Location: [facility];  Service: ENT;  Laterality: N/A;  . SINUS SURGERY WITH INSTATRAK     x 2  . TUBAL LIGATION    . Vaginal cystectomy     x 2  . VAGINAL HYSTERECTOMY  05/2007   Vaginal repair, Dr Ubaldo Glassing.  Partial  hysterectomy.    Social History   Socioeconomic History  . Marital status: Married    Spouse name: Not on file  . Number of children: 2  . Years of education: 81  . Highest education level: Not on file  Occupational History  . Occupation: retired    Fish farm manager: AT [employer]  . Financial resource strain: Not on file  . Food insecurity:    Worry: Not on file    Inability: Not on file  . Transportation needs:    Medical: Not on file    Non-medical: Not on file  Tobacco Use  . Smoking status: Former Smoker    Packs/day: 0.50    Years: 15.00    Pack  years: 7.50    Types: Cigarettes    Last attempt to quit: 03/03/1998    Years since quitting: 19.9  . Smokeless tobacco: Never Used  . Tobacco comment: smoked 1973- 2006, up to <  1  ppd  Substance and Sexual Activity  . Alcohol use: No    Alcohol/week: 0.0 standard drinks  . Drug use: No  . Sexual activity:  Yes    Partners: Male    Comment: 1st intercourse- 67, partners- 2, married- 4 yrs   Lifestyle  . Physical activity:    Days per week: Not on file    Minutes per session: Not on file  . Stress: Not on file  Relationships  . Social connections:    Talks on phone: Not on file    Gets together: Not on file    Attends religious service: Not on file    Active member of club or organization: Not on file    Attends meetings of clubs or organizations: Not on file    Relationship status: Not on file  Other Topics Concern  . Not on file  Social History Narrative   HAS REGULAR EXERCISE   DAILY CAFFEINE: 1-2 CUPS   Patient is right handed.    Family History  Problem Relation Age of Onset  . Hypertension Father 62  . Stroke Father 75  . Heart attack Father         ? in 31s  . Hypertension Mother   . Neuropathy Mother   . Anemia Mother   . Coronary artery disease Brother        Stent placement in 60s  . Diabetes Maternal Uncle   . Heart attack Paternal [place]el [place] , ? age  . Colon cancer Neg Hx   . Seizures Neg Hx     Review of Systems  Musculoskeletal: Positive for back pain (chronic).  Psychiatric/Behavioral: Positive for dysphoric mood and sleep disturbance (controlled). The patient is nervous/anxious.        Objective:   Vitals:   02/01/18 1445  BP: 118/80  Pulse: 63  Resp: 16  Temp: 98.3 F (36.8 C)  SpO2: 98%   BP Readings from Last 3 Encounters:  02/01/18 118/80  12/23/17 136/84  12/18/17 100/68   Wt Readings from Last 3 Encounters:  02/01/18 124 lb (56.2 kg)  12/18/17 122 lb 3.2 oz (55.4 kg)  11/19/17 127 lb (57.6 kg)   Body mass  index is 18.99 kg/m.   Physical Exam  Constitutional: She appears well-developed and well-nourished. No distress.  Skin: She is not diaphoretic.  Psychiatric: Her behavior is normal. Judgment and thought content normal.  Mildly depressed affect           Assessment & Plan:   25 minutes were spent face-to-face with the patient, over 50% of which was spent counseling regarding her diagnosis of osteoporosis and discussing treatment options, including fosamax, reclast, prolia and forteo.  We discussed the importance of calcium, vitamin d and exercise.  Referral ordered to a specialist.     See Problem List for Assessment and Plan of chronic medical problems.

## 2018-02-01 ENCOUNTER — Ambulatory Visit: Payer: 59 | Admitting: Internal Medicine

## 2018-02-01 ENCOUNTER — Encounter: Payer: Self-pay | Admitting: Internal Medicine

## 2018-02-01 VITALS — BP 118/80 | HR 63 | Temp 98.3°F | Resp 16 | Ht 67.75 in | Wt 124.0 lb

## 2018-02-01 DIAGNOSIS — F411 Generalized anxiety disorder: Secondary | ICD-10-CM | POA: Diagnosis not present

## 2018-02-01 DIAGNOSIS — M810 Age-related osteoporosis without current pathological fracture: Secondary | ICD-10-CM | POA: Diagnosis not present

## 2018-02-01 DIAGNOSIS — F3289 Other specified depressive episodes: Secondary | ICD-10-CM | POA: Diagnosis not present

## 2018-02-01 MED ORDER — CLONAZEPAM 0.5 MG PO TABS: 0.5000 mg | ORAL_TABLET | Freq: Two times a day (BID) | ORAL | 1 refills | Status: DC

## 2018-02-01 MED ORDER — CHOLECALCIFEROL 25 MCG (1000 UT) PO TABS: 1000.0000 [IU] | ORAL_TABLET | Freq: Every day | ORAL | 3 refills | Status: DC

## 2018-02-01 MED ORDER — DESVENLAFAXINE SUCCINATE ER 50 MG PO TB24: 50.0000 mg | ORAL_TABLET | Freq: Every day | ORAL | 5 refills | Status: DC

## 2018-02-01 NOTE — Assessment & Plan Note (Signed)
Reviewed dexa results - and discussed severity of the osteoporosis Discussed importance of calcium, vitamin d and exercise Reviewed medication options for OP and possible side effects Will refer to Dr Cruzita Lederer

## 2018-02-01 NOTE — Assessment & Plan Note (Signed)
She did not realize we had started pristiq and never got it from the pharmacy - after discussing again she agrees to try it since her depression is not controlled pristiq 50 mg daily - sent to pharmacy If she has side effects she will stop it

## 2018-02-01 NOTE — Assessment & Plan Note (Signed)
Not ideally controlled Taking clonazepam nightly, 1/2 during the day as needed Helping, will continue Discussed possible long term side effects She agrees to try pritiq - resent to the pharamcy

## 2018-02-01 NOTE — Patient Instructions (Addendum)
  Medications reviewed and updated.  Changes include :   Starting Pristiq daily for depression and anxiety.  Vitamin D prescription was sent to your pharmacy.   Your prescription(s) have been submitted to your pharmacy. Please take as directed and contact our office if you believe you are having problem(s) with the medication(s).   Please followup in 6 months - sooner if needed   Gyn associated with [facility]:    [facility]  [address]  [place], [place] [address]  Main: [phone]   Osteoporosis specialists:   Endocrine - Dr Cruzita Lederer with [facility] - referral ordered Orthopedics at [facility] - Dr Layne Benton

## 2018-02-11 ENCOUNTER — Ambulatory Visit: Payer: Self-pay | Admitting: Internal Medicine

## 2018-02-19 ENCOUNTER — Ambulatory Visit: Payer: Self-pay

## 2018-02-19 MED ORDER — ONDANSETRON 4 MG PO TBDP: 4.0000 mg | ORAL_TABLET | Freq: Three times a day (TID) | ORAL | 0 refills | Status: DC | PRN

## 2018-02-19 NOTE — Telephone Encounter (Signed)
  Pt. Started having nausea and abdominal discomfort 2 days ago.  No vomiting.Running low grade fever. Taking in ginger ale, crackers, toast. Requests "something for nausea be sent in or what would Dr. Quay Burow recommend OTC." Please advise pt. Uses [facility] on [address].  Answer Assessment - Initial Assessment Questions 1. NAUSEA SEVERITY: "How bad is the nausea?" (e.g., mild, moderate, severe; dehydration, weight loss)   - MILD: loss of appetite without change in eating habits   - MODERATE: decreased oral intake without significant weight loss, dehydration, or malnutrition   - SEVERE: inadequate caloric or fluid intake, significant weight loss, symptoms of dehydration     Moderate 2. ONSET: "When did the nausea begin?"     2 days ago 3. VOMITING: "Any vomiting?" If so, ask: "How many times today?"     No vomiting 4. RECURRENT SYMPTOM: "Have you had nausea before?" If so, ask: "When was the last time?" "What happened that time?"     Yes 5. CAUSE: "What do you think is causing the nausea?"     A virus 6. PREGNANCY: "Is there any chance you are pregnant?" (e.g., unprotected intercourse, missed birth control pill, broken condom)     No  Protocols used: NAUSEA-A-AH

## 2018-02-19 NOTE — Telephone Encounter (Signed)
Pt aware of response below.  

## 2018-02-19 NOTE — Telephone Encounter (Signed)
zofran sent to pharmacy.  If no improvement she needs to be seen-remind her the office is open tomorrow if needed.

## 2018-02-26 ENCOUNTER — Ambulatory Visit: Payer: Self-pay | Admitting: Internal Medicine

## 2018-03-09 ENCOUNTER — Telehealth: Payer: Self-pay | Admitting: Internal Medicine

## 2018-03-09 NOTE — Telephone Encounter (Signed)
Patient has called in re: to new patient appt on 03/11/2018 with Dr. Cruzita Lederer. Patient states [facility] says Dr. Cruzita Lederer is not network but Dr. Kelton Pillar is. Stated to patient referring office requested for them to see Dr. Cruzita Lederer so patient will have to call PCP to change referral. Also stated Dr. Cruzita Lederer is in network with [facility] and it must be a glitch.

## 2018-03-10 DIAGNOSIS — H900 Conductive hearing loss, bilateral: Secondary | ICD-10-CM | POA: Insufficient documentation

## 2018-03-10 DIAGNOSIS — H9193 Unspecified hearing loss, bilateral: Secondary | ICD-10-CM | POA: Insufficient documentation

## 2018-03-10 DIAGNOSIS — H6123 Impacted cerumen, bilateral: Secondary | ICD-10-CM | POA: Insufficient documentation

## 2018-03-11 ENCOUNTER — Ambulatory Visit: Payer: 59 | Admitting: Internal Medicine

## 2018-03-11 ENCOUNTER — Encounter: Payer: Self-pay | Admitting: Internal Medicine

## 2018-03-11 VITALS — BP 110/60 | HR 64 | Ht 67.75 in | Wt 124.0 lb

## 2018-03-11 DIAGNOSIS — M810 Age-related osteoporosis without current pathological fracture: Secondary | ICD-10-CM

## 2018-03-11 DIAGNOSIS — E559 Vitamin D deficiency, unspecified: Secondary | ICD-10-CM | POA: Diagnosis not present

## 2018-03-11 NOTE — Patient Instructions (Addendum)
For now, stay on 1000 units vitamin D daily and the Bone-up supplement taking 2 tablets 2x a day.  Please come back for a vitamin D level in 1.5 months.  Please come back for a follow-up appointment in 1 year.  How Can I Prevent Falls? Men and women with osteoporosis need to take care not to fall down. Falls can break bones. Some reasons people fall are: Poor vision  Poor balance  Certain diseases that affect how you walk  Some types of medicine, such as sleeping pills.  Some tips to help prevent falls outdoors are: Use a cane or walker  Wear rubber-soled shoes so you don't slip  Walk on grass when sidewalks are slippery  In winter, put salt or kitty litter on icy sidewalks.  Some ways to help prevent falls indoors are: Keep rooms free of clutter, especially on floors  Use plastic or carpet runners on slippery floors  Wear low-heeled shoes that provide good support  Do not walk in socks, stockings, or slippers  Be sure carpets and area rugs have skid-proof backs or are tacked to the floor  Be sure stairs are well lit and have rails on both sides  Put grab bars on bathroom walls near tub, shower, and toilet  Use a rubber bath mat in the shower or tub  Keep a flashlight next to your bed  Use a sturdy step stool with a handrail and wide steps  Add more lights in rooms (and night lights) Buy a cordless phone to keep with you so that you don't have to rush to the phone       when it rings and so that you can call for help if you fall.   (adapted from http://www.niams.NightlifePreviews.se)  Please check out the following book about best diet for bone health:   Exercise for Strong Bones (from [employer]) There are two types of exercises that are important for building and maintaining bone density:  weight-bearing and muscle-strengthening exercises. Weight-bearing Exercises These exercises include activities that make you move  against gravity while staying upright. Weight-bearing exercises can be high-impact or low-impact. High-impact weight-bearing exercises help build bones and keep them strong. If you have broken a bone due to osteoporosis or are at risk of breaking a bone, you may need to avoid high-impact exercises. If you're not sure, you should check with your healthcare provider. Examples of high-impact weight-bearing exercises are: . Dancing . Doing high-impact aerobics . Hiking . Jogging/running . Jumping Rope . Stair climbing . Tennis Low-impact weight-bearing exercises can also help keep bones strong and are a safe alternative if you cannot do high-impact exercises. Examples of low-impact weight-bearing exercises are: . Using elliptical training machines . Doing low-impact aerobics . Using stair-step machines . Fast walking on a treadmill or outside Muscle-Strengthening Exercises These exercises include activities where you move your body, a weight or some other resistance against gravity. They are also known as resistance exercises and include: . Lifting weights . Using elastic exercise bands . Using weight machines . Lifting your own body weight . Functional movements, such as standing and rising up on your toes Yoga and Pilates can also improve strength, balance and flexibility. However, certain positions may not be safe for people with osteoporosis or those at increased risk of broken bones. For example, exercises that have you bend forward may increase the chance of breaking a bone in the spine. A physical therapist should be able to help you learn which exercises  are safe and appropriate for you. Non-Impact Exercises Non-impact exercises can help you to improve balance, posture and how well you move in everyday activities. These exercises can also help to increase muscle strength and decrease the risk of falls and broken bones. Some of these exercises include: . Balance exercises that strengthen  your legs and test your balance, such as Tai Chi, can decrease your risk of falls. . Posture exercises that improve your posture and reduce rounded or "sloping" shoulders can help you decrease the chance of breaking a bone, especially in the spine. . Functional exercises that improve how well you move can help you with everyday activities and decrease your chance of falling and breaking a bone. For example, if you have trouble getting up from a chair or climbing stairs, you should do these activities as exercises. A physical therapist can teach you balance, posture and functional exercises. Starting a New Exercise Program If you haven't exercised regularly for a while, check with your healthcare provider before beginning a new exercise program-particularly if you have health problems such as heart disease, diabetes or high blood pressure. If you're at high risk of breaking a bone, you should work with a physical therapist to develop a safe exercise program. Once you have your healthcare provider's approval, start slowly. If you've already broken bones in the spine because of osteoporosis, be very careful to avoid activities that require reaching down, bending forward, rapid twisting motions, heavy lifting and those that increase your chance of a fall. As you get started, your muscles may feel sore for a day or two after you exercise. If soreness lasts longer, you may be working too hard and need to ease up. Exercises should be done in a pain-free range of motion. How Much Exercise Do You Need? Weight-bearing exercises 30 minutes on most days of the week. Do a 30-minutesession or multiple sessions spread out throughout the day. The benefits to your bones are the same.   Muscle-strengthening exercises Two to three days per week. If you don't have much time for strengthening/resistance training, do small amounts at a time. You can do just one body part each day. For example do arms one day, legs the next and  trunk the next. You can also spread these exercises out during your normal day.  Balance, posture and functional exercises Every day or as often as needed. You may want to focus on one area more than the others. If you have fallen or lose your balance, spend time doing balance exercises. If you are getting rounded shoulders, work more on posture exercises. If you have trouble climbing stairs or getting up from the couch, do more functional exercises. You can also perform these exercises at one time or spread them during your day. Work with a phyiscal therapist to learn the right exercises for you.

## 2018-03-11 NOTE — Progress Notes (Signed)
Patient ID: [name], female   DOB: 1946/02/18, 73 y.o.   MRN: [id number]    HPI  [name] is a 73 y.o.-year-old female, referred by Dr. Quay Burow, for management of osteoporosis.  Pt was dx with OP in 12/2017.  I reviewed pt's DEXA scans: Date L1-L3 (L4) T score FN T score 33% distal Radius  12/16/2017 -2.5 (-5.2%*) RFN: -1.9 LFN: -2.2 n/a  05/17/2015 -2.2  RFN: -1.9 LFN: -2.1 n/a   She denies fractures. No vertigo/orthostasis/poor vision. + diziness. +2 falls -in last yeas and 1 the year before. She has neuropathy. She hurt her knees. She has back pain.   + Previous OP treatments:  Fosamax ~30 years ago for 5-10 years.   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  She is wondering whether this could have been related to her Fosamax use in the past.  + h/o vitamin D insufficiency. Reviewed available vit D levels: Lab Results  Component Value Date   VD25OH 30.94 12/18/2017   VD25OH 32.30 12/16/2016   VD25OH 44 11/14/2015   VD25OH 26 (L) 06/21/2015   VD25OH 34 11/08/2014   VD25OH 42.79 12/06/2013   VD25OH 45 04/30/2010   Pt is on calcium 1000 mg; but is on on vitamin D supplements - 1000 IU daily + 800 IU from Bone-Up supplement (also has vit K2) - started 01/2018.  + walking, works in the garden.  She does not take high vitamin A doses.  Menopause was at late 60s >> was on HRT for years.    FH of osteoporosis: mother, aunt.  No h/o hyper/hypocalcemia or hyperparathyroidism. No h/o kidney stones. Lab Results  Component Value Date   CALCIUM 9.8 12/18/2017   CALCIUM 9.5 12/16/2016   CALCIUM 9.6 12/07/2015   CALCIUM 9.7 08/15/2015   CALCIUM 9.5 12/05/2014   CALCIUM 9.2 05/30/2014   CALCIUM 9.3 12/06/2013   CALCIUM 9.2 03/28/2013   CALCIUM 9.5 11/10/2012   CALCIUM 9.1 12/01/2011   No h/o thyrotoxicosis. Reviewed TSH recent levels:  Lab Results  Component Value Date   TSH 3.48 12/18/2017   TSH 2.68 12/16/2016   TSH  3.12 12/07/2015   TSH 3.45 04/26/2015   TSH 2.37 08/09/2014   No h/o CKD. Last BUN/Cr: Lab Results  Component Value Date   BUN 11 12/18/2017   CREATININE 0.69 12/18/2017   No kidney stones.  ROS: Constitutional: + weight loss (thinks ~15 lbs - 2/2 anxiety/depression), + fatigue, no subjective hyperthermia/hypothermia, + poor sleep, + nocturia Eyes: no blurry vision, no xerophthalmia ENT: no sore throat, no nodules palpated in throat, no dysphagia/odynophagia, no hoarseness Cardiovascular: no CP/SOB/+ palpitations/no leg swelling Respiratory: no cough/SOB Gastrointestinal: no N/V/D/+ C Musculoskeletal: + Both muscle/joint aches Skin: no rashes Neurological: no tremors/numbness/tingling/dizziness Psychiatric: + Both depression/anxiety + low libido  Past Medical History:  Diagnosis Date  . Anxiety   . Complication of anesthesia   . Depression    generalized anxiety disorder  . Diverticulosis   . Duodenal diverticulum   . Family history of adverse reaction to anesthesia    Mother and Daughters- N/V  . Fibromyalgia   . Gastroesophageal reflux disease with hiatal hernia   . Hiatal hernia   . History of kidney stones   . Hyperlipidemia   . IBS (irritable bowel syndrome)   . Lymphocytic colitis    Dr Sharlett Iles  . Migraine headache   . Mitral valve prolapse   . Neuropathy   . Osteopenia  BMD ordered by GYN  . Peripheral neuropathy    treated as RLS by  Neurology  . PONV (postoperative nausea and vomiting)   . Restless leg syndrome   . Schatzki's ring    History of  . Sleep apnea    On CPAP, has not been using  . Spondylosis    Past Surgical History:  Procedure Laterality Date  . ANAL RECTAL MANOMETRY N/A 11/14/2015   Procedure: ANO RECTAL MANOMETRY;  Surgeon: Gatha Mayer, MD;  Location: [facility];  Service: Endoscopy;  Laterality: N/A;  . BREAST ENHANCEMENT SURGERY    . CATARACT EXTRACTION Bilateral   . cataract surgery Left    Dr Bing Plume  .  CHOLECYSTECTOMY    . COLONOSCOPY    . ESOPHAGEAL DILATION     X 2  . ROTATOR CUFF REPAIR Left   . SINUS ENDO W/FUSION N/A 05/30/2014   Procedure: REVISION  FRONTAL SINUS SURGERY WITH FUSION SCAN;  Surgeon: Jerrell Belfast, MD;  Location: [facility];  Service: ENT;  Laterality: N/A;  . SINUS SURGERY WITH INSTATRAK     x 2  . TUBAL LIGATION    . Vaginal cystectomy     x 2  . VAGINAL HYSTERECTOMY  05/2007   Vaginal repair, Dr Ubaldo Glassing.  Partial  hysterectomy.   Social History   Socioeconomic History  . Marital status: Married    Spouse name: Not on file  . Number of children: 2  . Years of education: 65  . Highest education level: Not on file  Occupational History  . Occupation: retired    Fish farm manager: AT [employer]  . Financial resource strain: Not on file  . Food insecurity:    Worry: Not on file    Inability: Not on file  . Transportation needs:    Medical: Not on file    Non-medical: Not on file  Tobacco Use  . Smoking status: Former Smoker    Packs/day: 0.50    Years: 15.00    Pack years: 7.50    Types: Cigarettes    Last attempt to quit: 03/03/1998    Years since quitting: 20.0  . Smokeless tobacco: Never Used  . Tobacco comment: smoked 1973- 2006, up to <  1  ppd  Substance and Sexual Activity  . Alcohol use: No    Alcohol/week: 0.0 standard drinks  . Drug use: No  . Sexual activity: Yes    Partners: Male    Comment: 1st intercourse- 4, partners- 2, married- 85 yrs   Lifestyle  . Physical activity:    Days per week: Not on file    Minutes per session: Not on file  . Stress: Not on file  Relationships  . Social connections:    Talks on phone: Not on file    Gets together: Not on file    Attends religious service: Not on file    Active member of club or organization: Not on file    Attends meetings of clubs or organizations: Not on file    Relationship status: Not on file  . Intimate partner violence:    Fear of current or ex partner: Not on file     Emotionally abused: Not on file    Physically abused: Not on file    Forced sexual activity: Not on file  Other Topics Concern  . Not on file  Social History Narrative   HAS REGULAR EXERCISE   DAILY CAFFEINE: 1-2 CUPS   Patient is  right handed.   Current Outpatient Medications on File Prior to Visit  Medication Sig Dispense Refill  . albuterol (PROVENTIL HFA;VENTOLIN HFA) 108 (90 Base) MCG/ACT inhaler Inhale 2 puffs into the lungs every 6 (six) hours as needed for wheezing or shortness of breath. 1 Inhaler 5  . Azelastine-Fluticasone (DYMISTA) 137-50 MCG/ACT SUSP Place 2 sprays into the nose daily.     [name] b complex vitamins capsule Take 1 capsule by mouth daily.    . Biotin 1000 MCG tablet Take 1,000 mcg by mouth daily.      . Cholecalciferol 25 MCG (1000 UT) tablet Take 1 tablet (1,000 Units total) by mouth daily. 90 tablet 3  . clonazePAM (KLONOPIN) 0.5 MG tablet Take 1 tablet (0.5 mg total) by mouth 2 (two) times daily. 60 tablet 1  . desvenlafaxine (PRISTIQ) 50 MG 24 hr tablet Take 1 tablet (50 mg total) by mouth daily. 30 tablet 5  . Magnesium Oxide 250 MG TABS Take 250 mg by mouth daily.     . ondansetron (ZOFRAN ODT) 4 MG disintegrating tablet Take 1 tablet (4 mg total) by mouth every 8 (eight) hours as needed for nausea or vomiting. 20 tablet 0  . pantoprazole (PROTONIX) 40 MG tablet Take 1 tablet (40 mg total) by mouth daily as needed. 30 tablet 3  . Probiotic Product (ALIGN) 4 MG CAPS Take 1 capsule by mouth daily.     [name] tretinoin (RETIN-A) 0.025 % cream APPLY 1 APPLICATION TO AFFECTED AREA AT BEDTIME  99  . vitamin C (ASCORBIC ACID) 500 MG tablet Take 500 mg by mouth daily.    . [DISCONTINUED] colesevelam (WELCHOL) 625 MG tablet Take 625 mg by mouth 2 (two) times daily with a meal. Take one tablet by mouth twice a day    . [DISCONTINUED] Hyoscyamine-Phenyltoloxamine ([place] NF) [id number] MG CAPS Take one tablet by mouth as needed for abdominal cramping 24 each 0   No current  facility-administered medications on file prior to visit.    Allergies  Allergen Reactions  . Adhesive [Tape] Rash  . Albuterol     heartburn  . Cymbalta [Duloxetine Hcl]     Diarrhea, nausea, anxiety worse, shaky  . Lyrica [Pregabalin] Other (See Comments)    Sedation  . Neurontin [Gabapentin] Other (See Comments)    Sedation  . Nitrofuran Derivatives Other (See Comments)    "tingling"  . Paxil [Paroxetine Hcl]     Bowel upset, tingling  . Prozac [Fluoxetine Hcl] Other (See Comments)    Made patient worse and constipation   Family History  Problem Relation Age of Onset  . Hypertension Father 21  . Stroke Father 39  . Heart attack Father         ? in 74s  . Hypertension Mother   . Neuropathy Mother   . Anemia Mother   . Coronary artery disease Brother        Stent placement in 60s  . Diabetes Maternal Uncle   . Heart attack Paternal [place] , ? age  . Colon cancer Neg Hx   . Seizures Neg Hx     PE: BP 110/60   Pulse 64   Ht 5' 7.75" (1.721 m)   Wt 124 lb (56.2 kg)   BMI 18.99 kg/m  Wt Readings from Last 3 Encounters:  03/11/18 124 lb (56.2 kg)  02/01/18 124 lb (56.2 kg)  12/18/17 122 lb 3.2 oz (55.4 kg)   Constitutional: normal  weight, in NAD.  Eyes: PERRLA, EOMI, no exophthalmos ENT: moist mucous membranes, no thyromegaly, no cervical lymphadenopathy Cardiovascular: RRR, No MRG Respiratory: CTA B Gastrointestinal: abdomen soft, NT, ND, BS+ Musculoskeletal: + deformities (kyphosis), strength intact in all 4 Skin: moist, warm, no rashes Neurological: no tremor with outstretched hands, DTR normal in all 4  Assessment: 1. Osteoporosis  2. H/o Vit D insufficiency  Plan: 1. Osteoporosis - likely age-related/postmenopausal, she has FH of OP - Discussed about increased risk of fracture, depending on the T score, greatly increased when the T score is lower than -2.5, but it is actually a continuum and -2.5 should not be regarded as an absolute  threshold. We reviewed her latest 2 DXA scan reports together, and I explained that based on the T scores, she has an increased risk for fractures.  The only significant change is at the level of the spine, the hips appear stable.   - we reviewed her dietary and supplemental calcium and vitamin D intake. I recommended to make sure she gets 1000-1200 mg of calcium daily preferentially from diet and will also continue her vitamin D supplementation since the last level was at the low end of normal in 12/2017 - discussed fall precautions   - given handout from [place] Re: weight bearing exercises - advised to do this every day or at least 5/7 days - I also recommended the [facility] - for skeletal loading. Given brochure and explained benefits. - I also recommended osteoporosis physical therapy at [facility] - we discussed about maintaining a good amount of protein in her diet. The recommended daily protein intake is ~0.8 g per kilogram per day (roughly 50 g/day for her). I advised her to try to aim for this amount, since a diet low in proteins can exacerbate osteoporosis. Also, avoid smoking or >2 drinks of alcohol a day. - I recommended the following book and explained the concept of low acid eating:  - We discussed about the different medication classes, benefits and side effects (including atypical fractures and ONJ - no dental workup in progress or planned).   I explained that first options are sq denosumab (Prolia) sq every 6 months for 6-10 years and zoledronic acid (Reclast) iv 1x a year for 1-2 years. I would use Teriparatide/Abaloparatide (which are daily subcu medication) or Romosozumab (monthly) as a last resort. She would like to avoid medications if possible.  I did discuss with her that it is not very clear whether her distant history of Fosamax caused her jaw degeneration.  However, both bisphosphonates and Prolia can cause ONJ so we need to be careful if we  do start her on these medicines in the future.  I did explain that the benefit/risk ratio is biased significantly towards benefit. - will check a new DXA scan in 2 years: unchanged or slightly higher T-scores are desirable. She plans to change her OB/GYN and to start seeing a doctor in our building.  In that case, she will not go back to [facility].  Therefore, the next bone density scan will probably be on another machine, we can check this at our [place] site. - will see pt back in a year  2. H/o Vit D insufficiency - latest vit D level reviewed >> low normal 12/2017.  - She was started on vitamin D in 01/2018 -we will continue approximately 1800 units daily and have her back for recheck in 1.5 months from now, approximately 2 months after she started  the supplement  Orders Placed This Encounter  Procedures  . VITAMIN D 25 Hydroxy (Vit-D Deficiency, Fractures)    Philemon Kingdom, MD PhD [facility] Endocrinology

## 2018-03-12 ENCOUNTER — Telehealth: Payer: Self-pay | Admitting: Podiatry

## 2018-03-12 ENCOUNTER — Telehealth: Payer: Self-pay | Admitting: Internal Medicine

## 2018-03-12 NOTE — Telephone Encounter (Signed)
There are also osteoporosis pgms at the [facility]. Please check with them.

## 2018-03-12 NOTE — Telephone Encounter (Signed)
"   I WAS SUPPOSE TO BE SCHEDULED FOR PT AND I HAVE NOT HEARD ANYTHING. I LEFT A MESSAGE LAST WEEK ABOUT IT.

## 2018-03-12 NOTE — Telephone Encounter (Signed)
I called pt and she states she called last week, left a message requesting to be reinstated with PT for treatment for the problem she was seen for in 11/2017. Dr. Josephina Shih reinstatement for [facility] for neuropathy.

## 2018-03-12 NOTE — Telephone Encounter (Signed)
Patient states was referred to a facility for physical therapy for osteoporosis and that they did not take her insurance.  She is requesting alternative places she can go for therapy.  Per patient her number for call back is [phone]

## 2018-03-15 NOTE — Telephone Encounter (Signed)
Gave patient MD advice and she agreed 

## 2018-03-17 DIAGNOSIS — M25562 Pain in left knee: Secondary | ICD-10-CM | POA: Insufficient documentation

## 2018-03-19 ENCOUNTER — Ambulatory Visit: Payer: 59 | Admitting: Internal Medicine

## 2018-03-19 ENCOUNTER — Encounter: Payer: Self-pay | Admitting: Internal Medicine

## 2018-03-19 VITALS — BP 100/70 | HR 68 | Ht 67.5 in | Wt 123.2 lb

## 2018-03-19 DIAGNOSIS — K581 Irritable bowel syndrome with constipation: Secondary | ICD-10-CM

## 2018-03-19 DIAGNOSIS — R1032 Left lower quadrant pain: Secondary | ICD-10-CM

## 2018-03-19 DIAGNOSIS — G8929 Other chronic pain: Secondary | ICD-10-CM | POA: Diagnosis not present

## 2018-03-19 DIAGNOSIS — K625 Hemorrhage of anus and rectum: Secondary | ICD-10-CM | POA: Diagnosis not present

## 2018-03-19 NOTE — Progress Notes (Signed)
[name] 73 y.o. May 12, 1945 [id number]  Assessment & Plan:   Encounter Diagnoses  Name Primary?  . Irritable bowel syndrome with constipation Yes  . Chronic LLQ pain   . Rectal bleeding x 1     Exam and chronicity are reassuring.  I do not think she needs further investigation but have recommended she take MiraLAX on a daily basis up to 2 doses a day but start with 1 dose a day and follow-up as needed.  I will anticipate considering a routine screening colonoscopy in 2022 though she will be 74. Some of her left lower quadrant pain could be radicular I suppose I cannot get a good feel for it from the history necessarily but she will address that with Dr. Nelva Bush.  I appreciate the opportunity to care for this patient. CC: [name], MD Suella Broad, MD  Subjective:   Chief Complaint: Left lower quadrant pain  HPI The patient is here with complaints of chronic left lower quadrant pain, she has had this issue for many years.  Probably first appeared around 2012 or so when she had a diarrheal illness and was diagnosed with lymphocytic colitis.  At that time she saw Dr. Tally Joe at [facility] and because of pain there which he felt was musculoskeletal he suggested considering local injection of the abdominal wall.  I think the symptoms abated somewhat but she does have intermittent sharp pains chronically, she also has chronic low back pain and sees Dr. Nelva Bush and I believe get some injections at times.  The pain is never severe but is bothersome and she is wondering why she might have it.  She has constipation issues she takes Metamucil but still struggles to defecate with straining.  She had a week anal sphincter on anal manometry 3 years ago and I recommended pelvic PT because she was having fecal leakage but she elected not to do that and says she is not having so much of a problem with that now.  She has neuropathy and stumbled on the stairs in December and fell and  did not cause too many problems but a day or 2 after that she had a small ball of mucus admixed with some bright red blood when she defecated.  She has not had any other bleeding since.  She does not have chronic regular bleeding but does sometimes have bright red blood on the tissue intermittently for a number of years with a history of hemorrhoids.  Last full colonoscopy 2012, she had a flexible sigmoidoscopy 2013.  No history of colonic neoplasia.  Lab Results  Component Value Date   WBC 5.4 12/18/2017   HGB 14.1 12/18/2017   HCT 42.2 12/18/2017   MCV 90.2 12/18/2017   PLT 243.0 12/18/2017    Allergies  Allergen Reactions  . Adhesive [Tape] Rash  . Albuterol     heartburn  . Cymbalta [Duloxetine Hcl]     Diarrhea, nausea, anxiety worse, shaky  . Lyrica [Pregabalin] Other (See Comments)    Sedation  . Neurontin [Gabapentin] Other (See Comments)    Sedation  . Nitrofuran Derivatives Other (See Comments)    "tingling"  . Paxil [Paroxetine Hcl]     Bowel upset, tingling  . Prozac [Fluoxetine Hcl] Other (See Comments)    Made patient worse and constipation   Current Meds  Medication Sig  . albuterol (PROVENTIL HFA;VENTOLIN HFA) 108 (90 Base) MCG/ACT inhaler Inhale 2 puffs into the lungs every 6 (six) hours as needed for  wheezing or shortness of breath.  . Azelastine-Fluticasone (DYMISTA) 137-50 MCG/ACT SUSP Place 2 sprays into the nose daily.   [name] b complex vitamins capsule Take 1 capsule by mouth daily.  . Biotin 1000 MCG tablet Take 1,000 mcg by mouth daily.    . Cholecalciferol 25 MCG (1000 UT) tablet Take 1 tablet (1,000 Units total) by mouth daily.  . clonazePAM (KLONOPIN) 0.5 MG tablet Take 1 tablet (0.5 mg total) by mouth 2 (two) times daily.  [name] desvenlafaxine (PRISTIQ) 50 MG 24 hr tablet Take 1 tablet (50 mg total) by mouth daily.  . Magnesium Oxide 250 MG TABS Take 250 mg by mouth daily.   . ondansetron (ZOFRAN ODT) 4 MG disintegrating tablet Take 1 tablet (4 mg total) by  mouth every 8 (eight) hours as needed for nausea or vomiting.  . pantoprazole (PROTONIX) 40 MG tablet Take 1 tablet (40 mg total) by mouth daily as needed.  . Probiotic Product (ALIGN) 4 MG CAPS Take 1 capsule by mouth daily.   [name] tretinoin (RETIN-A) 0.025 % cream APPLY 1 APPLICATION TO AFFECTED AREA AT BEDTIME  . vitamin C (ASCORBIC ACID) 500 MG tablet Take 500 mg by mouth daily.   Past Medical History:  Diagnosis Date  . Anxiety   . Complication of anesthesia   . Depression    generalized anxiety disorder  . Diverticulosis   . Duodenal diverticulum   . Family history of adverse reaction to anesthesia    Mother and Daughters- N/V  . Fibromyalgia   . Gastroesophageal reflux disease with hiatal hernia   . Hiatal hernia   . History of kidney stones   . Hyperlipidemia   . IBS (irritable bowel syndrome)   . Lymphocytic colitis    Dr Sharlett Iles  . Migraine headache   . Mitral valve prolapse   . Neuropathy   . Osteopenia    BMD ordered by GYN  . Peripheral neuropathy    treated as RLS by  Neurology  . PONV (postoperative nausea and vomiting)   . Restless leg syndrome   . Schatzki's ring    History of  . Sleep apnea    On CPAP, has not been using  . Spondylosis    Past Surgical History:  Procedure Laterality Date  . ANAL RECTAL MANOMETRY N/A 11/14/2015   Procedure: ANO RECTAL MANOMETRY;  Surgeon: Gatha Mayer, MD;  Location: [facility];  Service: Endoscopy;  Laterality: N/A;  . BREAST ENHANCEMENT SURGERY    . CATARACT EXTRACTION Bilateral   . cataract surgery Left    Dr Bing Plume  . CHOLECYSTECTOMY    . COLONOSCOPY    . ESOPHAGEAL DILATION     X 2  . ROTATOR CUFF REPAIR Left   . SINUS ENDO W/FUSION N/A 05/30/2014   Procedure: REVISION  FRONTAL SINUS SURGERY WITH FUSION SCAN;  Surgeon: Jerrell Belfast, MD;  Location: [place];  Service: ENT;  Laterality: N/A;  . SINUS SURGERY WITH INSTATRAK     x 2  . TUBAL LIGATION    . Vaginal cystectomy     x 2  . VAGINAL HYSTERECTOMY   05/2007   Vaginal repair, Dr Ubaldo Glassing.  Partial  hysterectomy.   Social History   Social History Narrative   HAS REGULAR EXERCISE   DAILY CAFFEINE: 1-2 CUPS   Patient is right handed.   family history includes Anemia in her mother; Coronary artery disease in her brother; Diabetes in her maternal uncle; Heart attack in her father and paternal uncle;  Hypertension in her mother; Hypertension (age of onset: 84) in her father; Neuropathy in her mother; Stroke (age of onset: 25) in her father.   Review of Systems As per HPI  Objective:   Physical Exam NAD, WDWN Eyes anicteric Back no CVAT Abdomen is soft and non-tender, sigmoid colon palpable but not abnormal  [name], CMA present.  Rectal - small anal tags, no mass, nontender and firm brown stool that is heme negative  See HPI data review

## 2018-03-19 NOTE — Patient Instructions (Signed)
Take miralax everyday.   Call back if you are still having problems.   Thank you for choosing me and [facility].  Gatha Mayer, M.D., [facility]

## 2018-05-25 ENCOUNTER — Ambulatory Visit (INDEPENDENT_AMBULATORY_CARE_PROVIDER_SITE_OTHER): Payer: 59 | Admitting: *Deleted

## 2018-05-25 DIAGNOSIS — J309 Allergic rhinitis, unspecified: Secondary | ICD-10-CM

## 2018-05-25 DIAGNOSIS — J45901 Unspecified asthma with (acute) exacerbation: Secondary | ICD-10-CM

## 2018-05-25 MED ORDER — DOXYCYCLINE HYCLATE 100 MG PO TABS: 100.0000 mg | ORAL_TABLET | Freq: Two times a day (BID) | ORAL | 0 refills | Status: DC

## 2018-05-25 MED ORDER — HYDROCOD POLST-CPM POLST ER 10-8 MG/5ML PO SUER: 5.0000 mL | Freq: Two times a day (BID) | ORAL | 0 refills | Status: DC | PRN

## 2018-05-25 MED ORDER — FLUTICASONE FUROATE-VILANTEROL 200-25 MCG/INH IN AEPB: 1.0000 | INHALATION_SPRAY | Freq: Every day | RESPIRATORY_TRACT | 8 refills | Status: DC

## 2018-05-25 NOTE — Telephone Encounter (Signed)
Patient does not know how to do anything that has to do with electronics. Does not feel comfortable with trying the virtual visit. Can she do a telemedicine visit?

## 2018-05-25 NOTE — Telephone Encounter (Signed)
Can she do a telemedicine visit/ video visit?

## 2018-05-25 NOTE — Telephone Encounter (Signed)
Message from Sheran Luz sent at 05/25/2018 11:17 AM EDT   Summary: cough, congestion    Patient called with complaints of cough and congestion. Patient states this has been going on for about 3 days. She is requesting a call back to advise her on what she should do to alleviate symptoms.           Returned call to patient regarding having a cough and congestion that has been going on for about over 4 days. But the productive cough for about 3 days yellow phlegm.  She has a hx of asthma and allergies. She has hurting in her back and chest from asthma. Using the inhaler. Has a hx of bronchitis.  States ears feel full and glands on sides of neck feel swollen. Denies fever and using an allergy nose spray and saline wash. Uses an inhaler. Requesting a cough med to be called in. She does not want to come in because of her hx of resp. Illnesses. Home care advice given: using a humidifier, over the counter cough med with honey, hot tea with lemon and honey, forcing fluids, taking 2 teaspoon of honey at bedtime. Pt voiced understanding. Routing to flow at [facility] for recommendations and call back.  Reason for Disposition . Cough has been present for > 3 weeks  Answer Assessment - Initial Assessment Questions 1. ONSET: "When did the cough begin?"      4 days 2. SEVERITY: "How bad is the cough today?"      Gets spells of coughing 3. RESPIRATORY DISTRESS: "Describe your breathing."      A little bit shortness of breath, probably asthma 4. FEVER: "Do you have a fever?" If so, ask: "What is your temperature, how was it measured, and when did it start?"     no 5. SPUTUM: "Describe the color of your sputum" (clear, white, yellow, green)     yellow 6. HEMOPTYSIS: "Are you coughing up any blood?" If so ask: "How much?" (flecks, streaks, tablespoons, etc.)     no 7. CARDIAC HISTORY: "Do you have any history of heart disease?" (e.g., heart attack, congestive heart failure)       no 8. LUNG HISTORY: "Do you have any history of lung disease?"  (e.g., pulmonary embolus, asthma, emphysema)     Hx of bronchitis, asthma 9. PE RISK FACTORS: "Do you have a history of blood clots?" (or: recent major surgery, recent prolonged travel, bedridden)     no 10. OTHER SYMPTOMS: "Do you have any other symptoms?" (e.g., runny nose, wheezing, chest pain)       Tiny bit of wheezing 11. PREGNANCY: "Is there any chance you are pregnant?" "When was your last menstrual period?"       no 12. TRAVEL: "Have you traveled out of the country in the last month?" (e.g., travel history, exposures)       no  Protocols used: [place]

## 2018-05-25 NOTE — Telephone Encounter (Signed)
Cumulative time during 7-day interval 15 minutes, there was not an associated office visit for this concern within a 7 day period. Verbal consent for services obtained from patient prior to services given.  Names of all persons present for services: [name], MD, [place] [name]   Chief complaint: Cough, congestion  She is a long history of asthma and allergies.  For several days she has had a flareup of her asthma and allergies.  She states productive cough of yellow sputum, mild wheeze, chest tightness, pain across her upper back, mild shortness of breath, nasal congestion, swollen glands in her neck, her ears feel full, mild sore throat and she felt achy for 1 day.  She denies any fevers, chills, postnasal drip, nausea, vomiting or diarrhea.  She has been using her Dymista nasal spray, saline nasal spray and her albuterol inhaler.  She is using the albuterol inhaler twice a day.  It does help, but feels she could use it more.  She is not on a maintenance inhaler for her asthma, but was at one point in the past.  After reviewing her chart it appears that she did try Symbicort but for some reason she did not like it.  She does not recall this.  She was also on Advair, but has been a while.  She does have a history of osteoporosis and is on medication for that.  She does have a history of chronic sinus infections and bronchitis.  History, background, results pertinent:  Past Medical History:  Diagnosis Date  . Anxiety   . Complication of anesthesia   . Depression    generalized anxiety disorder  . Diverticulosis   . Duodenal diverticulum   . Family history of adverse reaction to anesthesia    Mother and Daughters- N/V  . Fibromyalgia   . Gastroesophageal reflux disease with hiatal hernia   . Hiatal hernia   . History of kidney stones   . Hyperlipidemia   . IBS (irritable bowel syndrome)   . Lymphocytic colitis    Dr Sharlett Iles  . Migraine headache   . Mitral valve  prolapse   . Neuropathy   . Osteopenia    BMD ordered by GYN  . Peripheral neuropathy    treated as RLS by  Neurology  . PONV (postoperative nausea and vomiting)   . Restless leg syndrome   . Schatzki's ring    History of  . Sleep apnea    On CPAP, has not been using  . Spondylosis        A/P/next steps: Asthma exacerbation, allergic rhinitis, concern for early bronchitis  Continue Dymista nasal spray, saline nasal spray and albuterol inhaler as needed, as well as other over-the-counter cold medications for symptom relief.  Given her osteoporosis we will try adding a maintenance inhaler with a steroid.  Hopefully this will allow [place] to avoid oral steroids.  Advised to rinse mouth out after use.  If there is no improvement we may need to add oral steroids-she will let me know if her symptoms do not improve or worsen over the next couple of days.  Given her history of infections I am concerned this could be early bacterial bronchitis or turn into that.  I do want to keep her out of the office.  We will be starting doxycycline twice daily x10 days, which she has tolerated well in the past.  Discussed that if she is not feeling better or if her symptoms worsen she needs to call immediately.  If necessary she can be seen in the office, but we will try to do everything over the phone, which is what she prefers as well.

## 2018-05-25 NOTE — Addendum Note (Signed)
Addended by: [name] on: 05/25/2018 02:44 PM   Modules accepted: Orders

## 2018-05-26 NOTE — Telephone Encounter (Signed)
Telephone visit 05/25/2018 noted. °

## 2018-08-25 ENCOUNTER — Telehealth: Payer: Self-pay

## 2018-08-25 NOTE — Telephone Encounter (Signed)

## 2018-08-27 ENCOUNTER — Telehealth: Payer: Self-pay | Admitting: Cardiovascular Disease

## 2018-08-27 NOTE — Telephone Encounter (Signed)

## 2018-08-28 NOTE — Progress Notes (Signed)
Cardiology Office Note  Date:  08/30/2018   ID:  [name] [place], [place] 07-25-1945, MRN [id number]  PCP:  [name], MD   Chief Complaint  Patient presents with  . other    Patient c/o some SOB and fluttering feeling. Meds reviewed verbally with patient.     HPI:  Mrs. Recinos is a pleasant 73 yo with history of  hyperlipidemia, fibromyalgia,  despression extensive GI disease including hiatal hernia, GERD, gastritis, Schatzkes ring s/p dilatations  CT coronary calcium score of 0 September 2016 presents for follow-up of her chronic chest heaviness, hypotension, dizziness  Still with Significant anxiety, is at home, insomnia Worsening palpitations seem to be associated with her anxiety Better with benzodiazepine, but she has not been taking this very much Thinks she should be taking them when she is not taking them Clonazepam is been written twice daily, she takes it very sparingly  We discussed previous event monitor 08/12/2017 with her in detail  frequent very short episodes of tachycardia  less than 15 seconds  She does feel that her tachycardia episodes are improved when she controls her anxiety and takes her benzodiazepine  EKG personally reviewed by myself on todays visit Shows normal sinus rhythm with rate 61 bpm no significant ST or T wave changes  Other past medical history reviewed Stress test 07/14/2017 Normal wall motion, good exercise tolerance Target heart rate achieved on stress test No indication of blockage based on findings  Chronic neuropathy in her lower extremities Previous CT coronary calcium score discussed with her, score of 0 September 2016  Previous records reviewed Previous Holter monitor in 2012 showing rare short runs of SVT, ectopy. Previously on midodrine for hypotension, stopped this as it was not helping   family history of coronary artery disease and her chest pressure. Brother with a pacemaker  chronically low  blood pressure, previous symptoms of chronic dizziness and fatigue.  She also reports having obstructive sleep apnea, significant snoring and CPAP. She's not wearing her CPAP  previous episodes of heaviness in her chest. Cardiac catheterization in the past showed no significant disease.  Holter monitor showed short runs of SVT. 48-hour study.  episodes of shortness of breath, 30 day event monitor showing ABCs but no significant SVT.   Previous sinus surgery and reports her breathing is better. Still continues to snore with frequent waking at night  Prior echocardiogram showed mild MR with no other significant abnormalities. Strong family history of coronary artery disease.  cholesterol 132, HDL 68, LDL 54  PMH:   has a past medical history of Anxiety, Complication of anesthesia, Depression, Diverticulosis, Duodenal diverticulum, Family history of adverse reaction to anesthesia, Fibromyalgia, Gastroesophageal reflux disease with hiatal hernia, Hiatal hernia, History of kidney stones, Hyperlipidemia, IBS (irritable bowel syndrome), Lymphocytic colitis, Migraine headache, Mitral valve prolapse, Neuropathy, Osteopenia, Peripheral neuropathy, PONV (postoperative nausea and vomiting), Restless leg syndrome, Schatzki's ring, Sleep apnea, and Spondylosis.  PSH:    Past Surgical History:  Procedure Laterality Date  . ANAL RECTAL MANOMETRY N/A 11/14/2015   Procedure: ANO RECTAL MANOMETRY;  Surgeon: Gatha Mayer, MD;  Location: [facility];  Service: Endoscopy;  Laterality: N/A;  . BREAST ENHANCEMENT SURGERY    . CATARACT EXTRACTION Bilateral   . cataract surgery Left    Dr Bing Plume  . CHOLECYSTECTOMY    . COLONOSCOPY    . ESOPHAGEAL DILATION     X 2  . ROTATOR CUFF REPAIR Left   . SINUS ENDO W/FUSION N/A 05/30/2014  Procedure: REVISION  FRONTAL SINUS SURGERY WITH FUSION SCAN;  Surgeon: Jerrell Belfast, MD;  Location: [place];  Service: ENT;  Laterality: N/A;  . SINUS SURGERY WITH INSTATRAK     x  2  . TUBAL LIGATION    . Vaginal cystectomy     x 2  . VAGINAL HYSTERECTOMY  05/2007   Vaginal repair, Dr Ubaldo Glassing.  Partial  hysterectomy.    Current Outpatient Medications  Medication Sig Dispense Refill  . albuterol (PROVENTIL HFA;VENTOLIN HFA) 108 (90 Base) MCG/ACT inhaler Inhale 2 puffs into the lungs every 6 (six) hours as needed for wheezing or shortness of breath. 1 Inhaler 5  . Azelastine-Fluticasone (DYMISTA) 137-50 MCG/ACT SUSP Place 2 sprays into the nose daily.     [name] b complex vitamins capsule Take 1 capsule by mouth daily.    . Biotin 1000 MCG tablet Take 1,000 mcg by mouth daily.      . Cholecalciferol 25 MCG (1000 UT) tablet Take 1 tablet (1,000 Units total) by mouth daily. 90 tablet 3  . clonazePAM (KLONOPIN) 0.5 MG tablet Take 1 tablet (0.5 mg total) by mouth 2 (two) times daily. 60 tablet 1  . desvenlafaxine (PRISTIQ) 50 MG 24 hr tablet Take 1 tablet (50 mg total) by mouth daily. 30 tablet 5  . fluticasone furoate-vilanterol (BREO ELLIPTA) 200-25 MCG/INH AEPB Inhale 1 puff into the lungs daily. 1 each 8  . Magnesium Oxide 250 MG TABS Take 250 mg by mouth daily.     . ondansetron (ZOFRAN ODT) 4 MG disintegrating tablet Take 1 tablet (4 mg total) by mouth every 8 (eight) hours as needed for nausea or vomiting. 20 tablet 0  . pantoprazole (PROTONIX) 40 MG tablet Take 1 tablet (40 mg total) by mouth daily as needed. 30 tablet 3  . Probiotic Product (ALIGN) 4 MG CAPS Take 1 capsule by mouth daily.     [name] tretinoin (RETIN-A) 0.025 % cream APPLY 1 APPLICATION TO AFFECTED AREA AT BEDTIME  99  . vitamin C (ASCORBIC ACID) 500 MG tablet Take 500 mg by mouth daily.     No current facility-administered medications for this visit.      Allergies:   Adhesive [tape], Albuterol, Cymbalta [duloxetine hcl], Lyrica [pregabalin], Neurontin [gabapentin], Nitrofuran derivatives, Paxil [paroxetine hcl], and Prozac [fluoxetine hcl]   Social History:  The patient  reports that she quit smoking  about 20 years ago. Her smoking use included cigarettes. She has a 7.50 pack-year smoking history. She has never used smokeless tobacco. She reports that she does not drink alcohol or use drugs.   Family History:   family history includes Anemia in her mother; Coronary artery disease in her brother; Diabetes in her maternal uncle; Heart attack in her father and paternal uncle; Hypertension in her mother; Hypertension (age of onset: 4) in her father; Neuropathy in her mother; Stroke (age of onset: 36) in her father.    Review of Systems: Review of Systems  Constitutional: Positive for malaise/fatigue and weight loss.  Respiratory: Negative.   Cardiovascular: Negative.   Gastrointestinal: Negative.   Musculoskeletal: Negative.   Neurological: Negative.        Neuropathy lower extremities  Psychiatric/Behavioral: Positive for depression. The patient is nervous/anxious.   All other systems reviewed and are negative.    PHYSICAL EXAM: VS:  BP 100/60 (BP Location: Left Arm, Patient Position: Sitting, Cuff Size: Normal)   Pulse 61   Ht 5' 7.5" (1.715 m)   Wt 127 lb 8 oz (57.8  kg)   BMI 19.67 kg/m  , BMI Body mass index is 19.67 kg/m. Constitutional:  oriented to person, place, and time. No distress.  HENT:  Head: Normocephalic and atraumatic.  Eyes:  no discharge. No scleral icterus.  Neck: Normal range of motion. Neck supple. No JVD present.  Cardiovascular: Normal rate, regular rhythm, normal heart sounds and intact distal pulses. Exam reveals no gallop and no friction rub. No edema No murmur heard. Pulmonary/Chest: Effort normal and breath sounds normal. No stridor. No respiratory distress.  no wheezes.  no rales.  no tenderness.  Abdominal: Soft.  no distension.  no tenderness.  Musculoskeletal: Normal range of motion.  no  tenderness or deformity.  Neurological:  normal muscle tone. Coordination normal. No atrophy Skin: Skin is warm and dry. No rash noted. not diaphoretic.   Psychiatric:  normal mood and affect. behavior is normal. Thought content normal.    Recent Labs: 12/18/2017: ALT 8; BUN 11; Creatinine, Ser 0.69; Hemoglobin 14.1; Platelets 243.0; Potassium 4.4; Sodium 140; TSH 3.48    Lipid Panel Lab Results  Component Value Date   CHOL 212 (H) 12/18/2017   HDL 65.90 12/18/2017   LDLCALC 130 (H) 12/18/2017   TRIG 82.0 12/18/2017      Wt Readings from Last 3 Encounters:  08/30/18 127 lb 8 oz (57.8 kg)  03/19/18 123 lb 4 oz (55.9 kg)  03/11/18 124 lb (56.2 kg)       ASSESSMENT AND PLAN:  Depression/anxiety Likely contributing to much of her presentation today Long history of anxiety depression fibromyalgia Recommend she take more of her benzodiazepine as needed She has not been taking this on any consistent manner even when having symptoms  Hyperlipidemia, unspecified hyperlipidemia type -  Calcium score of 0 Previously declined a statin  Palpitations - Plan: EKG 12-Lead monitor with short runs of SVT, atrial tachycardia Bothered more when she is having anxiety Symptoms seem to improve when taking her benzodiazepine Suggested she start to take this more frequently for symptoms  Orthostatic hypotension Recommended hydration, standing up slowly. Do not avoid salt No indication for Florinef or midodrine  Shortness of breath Previously reported having symptoms, atypical in nature  normal stress test and echocardiogram Recommended regular walking program   Disposition:   F/U as needed   Total encounter time more than 25 minutes  Greater than 50% was spent in counseling and coordination of care with the patient    Orders Placed This Encounter  Procedures  . EKG 12-Lead     Signed, Esmond Plants, M.D., Ph.D. 08/30/2018  [place], [place]

## 2018-08-30 ENCOUNTER — Encounter: Payer: Self-pay | Admitting: Cardiovascular Disease

## 2018-08-30 ENCOUNTER — Ambulatory Visit: Payer: 59 | Admitting: Cardiovascular Disease

## 2018-08-30 ENCOUNTER — Other Ambulatory Visit: Payer: Self-pay

## 2018-08-30 VITALS — BP 100/60 | HR 61 | Ht 67.5 in | Wt 127.5 lb

## 2018-08-30 DIAGNOSIS — E782 Mixed hyperlipidemia: Secondary | ICD-10-CM

## 2018-08-30 DIAGNOSIS — I951 Orthostatic hypotension: Secondary | ICD-10-CM

## 2018-08-30 DIAGNOSIS — R002 Palpitations: Secondary | ICD-10-CM | POA: Diagnosis not present

## 2018-08-30 DIAGNOSIS — R079 Chest pain, unspecified: Secondary | ICD-10-CM | POA: Diagnosis not present

## 2018-08-30 DIAGNOSIS — R0602 Shortness of breath: Secondary | ICD-10-CM

## 2018-08-30 NOTE — Patient Instructions (Addendum)

## 2018-09-01 ENCOUNTER — Telehealth: Payer: Self-pay | Admitting: Cardiovascular Disease

## 2018-09-01 NOTE — Telephone Encounter (Signed)
° °  Patient states she has EP issues with heart and wants to know if this means eventual ppm insertion    Patient also wants to know specific name for EP issue she has     Patient wants to know if she needs another screening for blockage ? CT calcium score    Patient wants to know what mixed hyperlipidemia means

## 2018-09-02 NOTE — Telephone Encounter (Signed)
Left voicemail message to call back  

## 2018-09-02 NOTE — Telephone Encounter (Signed)
Patient returning call.

## 2018-09-02 NOTE — Telephone Encounter (Addendum)
Patient wanted to know what her electrical problem specifically was tachycardia. She wanted to know if this would result atrial fibrillation and I told her that was not likely. She wanted to know if she could have the CT Cardiac Scoring done again. Advised that wouldn't not be needed based on her Calcium score of 0 and other testing which showed normal stress test. Certainly if she has any symptoms then we could review with provider and determine next step. She then wanted to review heart rates and advised that it can vary based on many factors such as exertion, anxiety, or other activities. Advised that 60-100 is within normal range but that small increases for short periods during high stress situations can be normal. She then wanted to know if this would results in need for pacemaker. Reviewed there was no indication for this at this time and this diagnosis does not typically require it. She was very appreciative for the time and review of information.

## 2018-09-07 ENCOUNTER — Ambulatory Visit: Payer: 59 | Admitting: Cardiovascular Disease

## 2018-09-20 ENCOUNTER — Ambulatory Visit: Payer: 59 | Admitting: Podiatry

## 2018-09-22 ENCOUNTER — Ambulatory Visit: Payer: 59 | Admitting: Podiatry

## 2018-09-24 DIAGNOSIS — M5030 Other cervical disc degeneration, unspecified cervical region: Secondary | ICD-10-CM | POA: Insufficient documentation

## 2018-09-29 LAB — HM MAMMOGRAPHY

## 2018-10-05 ENCOUNTER — Telehealth: Payer: Self-pay | Admitting: Podiatry

## 2018-10-05 NOTE — Telephone Encounter (Signed)
Pt was seen in office in September 2019 and had a referral sent to physical therapy but never heard anything from them. Pt would like to make sure the referral is still active and would like to get scheduled for therapy.

## 2018-10-06 NOTE — Telephone Encounter (Signed)
Ria Comment:  This patient needs to come in for an appointment to be seen. I did discuss with Marylou Mccoy, RN and Dr. Paulla Dolly. She will need to come in.  Thank you,  Rolly Pancake

## 2018-10-19 ENCOUNTER — Encounter: Payer: Self-pay | Admitting: Internal Medicine

## 2018-10-19 NOTE — Progress Notes (Signed)
Abstracted and sent to scan  

## 2018-10-21 ENCOUNTER — Other Ambulatory Visit: Payer: Self-pay

## 2018-10-21 ENCOUNTER — Ambulatory Visit (INDEPENDENT_AMBULATORY_CARE_PROVIDER_SITE_OTHER): Payer: 59

## 2018-10-21 ENCOUNTER — Ambulatory Visit: Payer: 59 | Admitting: Podiatry

## 2018-10-21 ENCOUNTER — Encounter: Payer: Self-pay | Admitting: Podiatry

## 2018-10-21 VITALS — Temp 97.6°F

## 2018-10-21 DIAGNOSIS — M2042 Other hammer toe(s) (acquired), left foot: Secondary | ICD-10-CM

## 2018-10-21 DIAGNOSIS — G629 Polyneuropathy, unspecified: Secondary | ICD-10-CM

## 2018-10-21 DIAGNOSIS — Q828 Other specified congenital malformations of skin: Secondary | ICD-10-CM

## 2018-10-21 DIAGNOSIS — M2041 Other hammer toe(s) (acquired), right foot: Secondary | ICD-10-CM | POA: Diagnosis not present

## 2018-10-21 NOTE — Patient Instructions (Signed)
Hammer Toe  Hammer toe is a change in the shape (a deformity) of your toe. The deformity causes the middle joint of your toe to stay bent. This causes pain, especially when you are wearing shoes. Hammer toe starts gradually. At first, the toe can be straightened. Gradually over time, the deformity becomes stiff and permanent. Early treatments to keep the toe straight may relieve pain. As the deformity becomes stiff and permanent, surgery may be needed to straighten the toe. What are the causes? Hammer toe is caused by abnormal bending of the toe joint that is closest to your foot. It happens gradually over time. This pulls on the muscles and connections (tendons) of the toe joint, making them weak and stiff. It is often related to wearing shoes that are too short or narrow and do not let your toes straighten. What increases the risk? You may be at greater risk for hammer toe if you:  Are female.  Are older.  Wear shoes that are too small.  Wear high-heeled shoes that pinch your toes.  Are a ballet dancer.  Have a second toe that is longer than your big toe (first toe).  Injure your foot or toe.  Have arthritis.  Have a family history of hammer toe.  Have a nerve or muscle disorder. What are the signs or symptoms? The main symptoms of this condition are pain and deformity of the toe. The pain is worse when wearing shoes, walking, or running. Other symptoms may include:  Corns or calluses over the bent part of the toe or between the toes.  Redness and a burning feeling on the toe.  An open sore that forms on the top of the toe.  Not being able to straighten the toe. How is this diagnosed? This condition is diagnosed based on your symptoms and a physical exam. During the exam, your health care provider will try to straighten your toe to see how stiff the deformity is. You may also have tests, such as:  A blood test to check for rheumatoid arthritis.  An X-ray to show how  severe the deformity is. How is this treated? Treatment for this condition will depend on how stiff the deformity is. Surgery is often needed. However, sometimes a hammer toe can be straightened without surgery. Treatments that do not involve surgery include:  Taping the toe into a straightened position.  Using pads and cushions to protect the toe (orthotics).  Wearing shoes that provide enough room for the toes.  Doing toe-stretching exercises at home.  Taking an NSAID to reduce pain and swelling. If these treatments do not help or the toe cannot be straightened, surgery is the next option. The most common surgeries used to straighten a hammer toe include:  Arthroplasty. In this procedure, part of the joint is removed, and that allows the toe to straighten.  Fusion. In this procedure, cartilage between the two bones of the joint is taken out and the bones are fused together into one longer bone.  Implantation. In this procedure, part of the bone is removed and replaced with an implant to let the toe move again.  Flexor tendon transfer. In this procedure, the tendons that curl the toes down (flexor tendons) are repositioned. Follow these instructions at home:  Take over-the-counter and prescription medicines only as told by your health care provider.  Do toe straightening and stretching exercises as told by your health care provider.  Keep all follow-up visits as told by your health care   provider. This is important. How is this prevented?  Wear shoes that give your toes enough room and do not cause pain.  Do not wear high-heeled shoes. Contact a health care provider if:  Your pain gets worse.  Your toe becomes red or swollen.  You develop an open sore on your toe. This information is not intended to replace advice given to you by your health care provider. Make sure you discuss any questions you have with your health care provider. Document Released: 02/15/2000 Document  Revised: 01/30/2017 Document Reviewed: 06/13/2015 Elsevier Patient Education  2020 [employer]  

## 2018-10-22 NOTE — Progress Notes (Signed)
Subjective:   Patient ID: [name], female   DOB: 73 y.o.   MRN: [id number]   HPI Patient presents with longstanding neuropathic changes and also is concerned about callus formation that bothers her and makes it hard to walk.  Patient's not been seen in almost a year and states that the neuropathy seems slightly worse   ROS      Objective:  Physical Exam  Neurovascular status found to be intact diminished sharp dull vibratory was noted bilateral.  Patient was noted to have moderate hammertoe deformity and also was noted to have keratotic tissue plantar aspect right that become bothersome at times.     Assessment:  Patient with long-term idiopathic neuropathy with digital deformities and tissue formation that is been moderately painful with patient noted to have a lesion formation that can become painful and digital deformities     Plan:  H&P reviewed conditions and x-rays and today recommended physical therapy to consider possibility for neurogenic's treatment or other treatment options.  Debrided lesion formation advised on wide comfortable shoes and reappoint to recheck  X-rays did indicate moderate digital deformities but no indications of advanced pathology

## 2018-11-10 ENCOUNTER — Telehealth: Payer: Self-pay | Admitting: Internal Medicine

## 2018-11-10 DIAGNOSIS — G609 Hereditary and idiopathic neuropathy, unspecified: Secondary | ICD-10-CM

## 2018-11-10 NOTE — Telephone Encounter (Signed)
Copied from [place] [id number]. Topic: General - Other >> Nov 10, 2018  9:00 AM Keene Breath wrote: Reason for CRM: Patient called to ask the doctor to send in a referral for her neuropathy doctor, Dr. Jannifer Franklin, [phone].  Please send the referral as soon as possible.

## 2018-11-10 NOTE — Telephone Encounter (Signed)
Referral ordered

## 2018-11-10 NOTE — Telephone Encounter (Signed)
Appointment? Does not look like this has been addressed.

## 2018-11-11 NOTE — Telephone Encounter (Signed)
Pt aware.

## 2018-11-22 ENCOUNTER — Encounter: Payer: 59 | Admitting: Obstetrics & Gynecology

## 2018-12-06 NOTE — Progress Notes (Signed)
Cardiology Office Note  Date:  12/07/2018   ID:  Abigaille Hougen [place], [place] 1945/03/31, MRN [id number]  PCP:  [name], MD   Chief Complaint  Patient presents with  . Other    Patient c/o some SOB and chest heaviness when trying to exercise. Meds reviewed verbally with patient.     HPI:  Mrs. Keal is a pleasant 73 yo with history of  hyperlipidemia, fibromyalgia,  despression extensive GI disease including hiatal hernia, GERD, gastritis, Schatzkes ring s/p dilatations  CT coronary calcium score of 0 September 2016 presents for follow-up of her chronic chest heaviness, hypotension, dizziness  Long meeting with her today, Stressors at home continue to affect her general health She continues to have insomnia Is not wearing her CPAP  sometimes chest feels heavy, sharp pain, chronic SOB Trying to walk, good days and bad days Palpitations Most symptoms seem to improve with taking Klonopin She has been taking this very sparingly Reports one prescription has lasted her 1 year, has several pills left Often does not take the pill when she probably should  Was at the beach last week, significant stressor back at home, did not have her Klonopin with her Almost drove back home to get it  Previous event monitor reviewed with her showing very short runs of narrow complex tachycardia Difficult to treat secondary to chronically low blood pressure She feels they are driven by her anxiety  EKG personally reviewed by myself on todays visit Shows normal sinus rhythm with rate 69 bpm no significant ST or T wave changes  Other past medical history reviewed Stress test 07/14/2017 Normal wall motion, good exercise tolerance Target heart rate achieved on stress test No indication of blockage based on findings  Chronic neuropathy in her lower extremities Previous CT coronary calcium score discussed with her, score of 0 September 2016  Previous Holter monitor in 2012  showing rare short runs of SVT, ectopy. Previously on midodrine for hypotension, stopped this as it was not helping   family history of coronary artery disease and her chest pressure. Brother with a pacemaker   PMH:   has a past medical history of Anxiety, Complication of anesthesia, Depression, Diverticulosis, Duodenal diverticulum, Family history of adverse reaction to anesthesia, Fibromyalgia, Gastroesophageal reflux disease with hiatal hernia, Hiatal hernia, History of kidney stones, Hyperlipidemia, IBS (irritable bowel syndrome), Lymphocytic colitis, Migraine headache, Mitral valve prolapse, Neuropathy, Osteopenia, Peripheral neuropathy, PONV (postoperative nausea and vomiting), Restless leg syndrome, Schatzki's ring, Sleep apnea, and Spondylosis.  PSH:    Past Surgical History:  Procedure Laterality Date  . ANAL RECTAL MANOMETRY N/A 11/14/2015   Procedure: ANO RECTAL MANOMETRY;  Surgeon: Gatha Mayer, MD;  Location: [facility];  Service: Endoscopy;  Laterality: N/A;  . BREAST ENHANCEMENT SURGERY    . CATARACT EXTRACTION Bilateral   . cataract surgery Left    Dr Bing Plume  . CHOLECYSTECTOMY    . COLONOSCOPY    . ESOPHAGEAL DILATION     X 2  . ROTATOR CUFF REPAIR Left   . SINUS ENDO W/FUSION N/A 05/30/2014   Procedure: REVISION  FRONTAL SINUS SURGERY WITH FUSION SCAN;  Surgeon: Jerrell Belfast, MD;  Location: [facility];  Service: ENT;  Laterality: N/A;  . SINUS SURGERY WITH INSTATRAK     x 2  . TUBAL LIGATION    . Vaginal cystectomy     x 2  . VAGINAL HYSTERECTOMY  05/2007   Vaginal repair, Dr Ubaldo Glassing.  Partial  hysterectomy.  Current Outpatient Medications  Medication Sig Dispense Refill  . albuterol (PROVENTIL HFA;VENTOLIN HFA) 108 (90 Base) MCG/ACT inhaler Inhale 2 puffs into the lungs every 6 (six) hours as needed for wheezing or shortness of breath. 1 Inhaler 5  . Azelastine-Fluticasone (DYMISTA) 137-50 MCG/ACT SUSP Place 2 sprays into the nose daily.     [name] b complex vitamins  capsule Take 1 capsule by mouth daily.    . Biotin 1000 MCG tablet Take 1,000 mcg by mouth daily.      . Cholecalciferol 25 MCG (1000 UT) tablet Take 1 tablet (1,000 Units total) by mouth daily. 90 tablet 3  . clonazePAM (KLONOPIN) 0.5 MG tablet Take 1 tablet (0.5 mg total) by mouth 2 (two) times daily. 60 tablet 1  . desvenlafaxine (PRISTIQ) 50 MG 24 hr tablet Take 1 tablet (50 mg total) by mouth daily. 30 tablet 5  . fluticasone furoate-vilanterol (BREO ELLIPTA) 200-25 MCG/INH AEPB Inhale 1 puff into the lungs daily. 1 each 8  . Magnesium Oxide 250 MG TABS Take 250 mg by mouth daily.     . ondansetron (ZOFRAN ODT) 4 MG disintegrating tablet Take 1 tablet (4 mg total) by mouth every 8 (eight) hours as needed for nausea or vomiting. 20 tablet 0  . oxyCODONE-acetaminophen (PERCOCET) 10-325 MG tablet TAKE 1 TABLET(S) 3 TIMES A DAY BY ORAL ROUTE AS NEEDED FOR PAIN FOR 5 DAYS.    [name] pantoprazole (PROTONIX) 40 MG tablet Take 1 tablet (40 mg total) by mouth daily as needed. 30 tablet 3  . Probiotic Product (ALIGN) 4 MG CAPS Take 1 capsule by mouth daily.     [name] tretinoin (RETIN-A) 0.025 % cream APPLY 1 APPLICATION TO AFFECTED AREA AT BEDTIME  99  . vitamin C (ASCORBIC ACID) 500 MG tablet Take 500 mg by mouth daily.     No current facility-administered medications for this visit.      Allergies:   Adhesive [tape], Albuterol, Cymbalta [duloxetine hcl], Lyrica [pregabalin], Neurontin [gabapentin], Nitrofuran derivatives, Paxil [paroxetine hcl], and Prozac [fluoxetine hcl]   Social History:  The patient  reports that she quit smoking about 20 years ago. Her smoking use included cigarettes. She has a 7.50 pack-year smoking history. She has never used smokeless tobacco. She reports that she does not drink alcohol or use drugs.   Family History:   family history includes Anemia in her mother; Coronary artery disease in her brother; Diabetes in her maternal uncle; Heart attack in her father and paternal uncle;  Hypertension in her mother; Hypertension (age of onset: 108) in her father; Neuropathy in her mother; Stroke (age of onset: 37) in her father.    Review of Systems: Review of Systems  Constitutional: Positive for malaise/fatigue.  HENT: Negative.   Respiratory: Negative.   Cardiovascular: Negative.   Gastrointestinal: Negative.   Musculoskeletal: Negative.   Neurological: Negative.   Psychiatric/Behavioral: Positive for depression. The patient is nervous/anxious.   All other systems reviewed and are negative.   PHYSICAL EXAM: VS:  BP 100/64 (BP Location: Left Arm, Patient Position: Sitting, Cuff Size: Normal)   Pulse 69   Ht 5' 7.5" (1.715 m)   Wt 132 lb 12 oz (60.2 kg)   BMI 20.48 kg/m  , BMI Body mass index is 20.48 kg/m. Constitutional:  oriented to person, place, and time. No distress.  HENT:  Head: Grossly normal Eyes:  no discharge. No scleral icterus.  Neck: No JVD, no carotid bruits  Cardiovascular: Regular rate and rhythm, no murmurs appreciated  Pulmonary/Chest: Clear to auscultation bilaterally, no wheezes or rails Abdominal: Soft.  no distension.  no tenderness.  Musculoskeletal: Normal range of motion Neurological:  normal muscle tone. Coordination normal. No atrophy Skin: Skin warm and dry Psychiatric: Appears depressed    Recent Labs: 12/18/2017: ALT 8; BUN 11; Creatinine, Ser 0.69; Hemoglobin 14.1; Platelets 243.0; Potassium 4.4; Sodium 140; TSH 3.48    Lipid Panel Lab Results  Component Value Date   CHOL 212 (H) 12/18/2017   HDL 65.90 12/18/2017   LDLCALC 130 (H) 12/18/2017   TRIG 82.0 12/18/2017      Wt Readings from Last 3 Encounters:  12/07/18 132 lb 12 oz (60.2 kg)  08/30/18 127 lb 8 oz (57.8 kg)  03/19/18 123 lb 4 oz (55.9 kg)      ASSESSMENT AND PLAN:  Depression/anxiety Likely contributing to much of her presentation today Long history of anxiety depression fibromyalgia She may need a counselor Significant family dynamic  stressors Not taking Klonopin when she probably should Recommend she try to get back on her CPAP, regular walking program -May respond well to SSRI.  Will defer to primary care  Hyperlipidemia, unspecified hyperlipidemia type -  Calcium score of 0 Previously declined statin  Palpitations -  monitor with short runs of SVT, atrial tachycardia Seems to bother her more when she has anxiety Not a good candidate for medications given low blood pressure We will try to avoid antiarrhythmics  Orthostatic hypotension Recommended hydration,  In general is asymptomatic  Disposition:   F/U 1 year  Long time spent with her discussing stressors at home, underlying depression, anxiety  Total encounter time more than 45 minutes  Greater than 50% was spent in counseling and coordination of care with the patient    Orders Placed This Encounter  Procedures  . EKG 12-Lead     Signed, Esmond Plants, M.D., Ph.D. 12/07/2018  [place], [place]

## 2018-12-07 ENCOUNTER — Ambulatory Visit (INDEPENDENT_AMBULATORY_CARE_PROVIDER_SITE_OTHER): Payer: 59 | Admitting: Cardiovascular Disease

## 2018-12-07 VITALS — BP 100/64 | HR 69 | Ht 67.5 in | Wt 132.8 lb

## 2018-12-07 DIAGNOSIS — R002 Palpitations: Secondary | ICD-10-CM

## 2018-12-07 DIAGNOSIS — F418 Other specified anxiety disorders: Secondary | ICD-10-CM

## 2018-12-07 DIAGNOSIS — R079 Chest pain, unspecified: Secondary | ICD-10-CM

## 2018-12-07 DIAGNOSIS — I951 Orthostatic hypotension: Secondary | ICD-10-CM

## 2018-12-07 DIAGNOSIS — R0602 Shortness of breath: Secondary | ICD-10-CM

## 2018-12-07 DIAGNOSIS — E782 Mixed hyperlipidemia: Secondary | ICD-10-CM | POA: Diagnosis not present

## 2018-12-07 DIAGNOSIS — F411 Generalized anxiety disorder: Secondary | ICD-10-CM

## 2018-12-07 NOTE — Patient Instructions (Addendum)
SVT (supraventricular tachycardia) Needs info  Echocardiogram for shortness of breath, chest pain Need test info  Medication Instructions:  No changes  If you need a refill on your cardiac medications before your next appointment, please call your pharmacy.    Lab work: No new labs needed   If you have labs (blood work) drawn today and your tests are completely normal, you will receive your results only by: [name] MyChart Message (if you have MyChart) OR . A paper copy in the mail If you have any lab test that is abnormal or we need to change your treatment, we will call you to review the results.   Testing/Procedures: Your physician has requested that you have an echocardiogram. Echocardiography is a painless test that uses sound waves to create images of your heart. It provides your doctor with information about the size and shape of your heart and how well your heart's chambers and valves are working. This procedure takes approximately one hour. There are no restrictions for this procedure. You may get an IV, if needed, to receive an ultrasound enhancing agent through to better visualize your heart.     Follow-Up: At [facility], you and your health needs are our priority.  As part of our continuing mission to provide you with exceptional heart care, we have created designated Provider Care Teams.  These Care Teams include your primary Cardiologist (physician) and Advanced Practice Providers (APPs -  Physician Assistants and Nurse Practitioners) who all work together to provide you with the care you need, when you need it.  . You will need a follow up appointment in 12 months .   Please call our office 2 months in advance to schedule this appointment.    . Providers on your designated Care Team:   . Murray Hodgkins, NP Christell Faith, PA-C    Supraventricular Tachycardia, Adult Supraventricular tachycardia (SVT) is a kind of abnormal heartbeat. It makes your heart beat very  fast and then beat at a normal speed. A normal resting heartbeat is 60-100 times a minute. This condition can make your heart beat more than 150 times a minute. Times of having a fast heartbeat (episodes) can be scary, but they are usually not dangerous. In some cases, they may lead to heart failure if:  They happen many times per day.  Last longer than a few seconds. What are the causes?  A normal heartbeat starts when an area called the sinoatrial node sends out an electrical signal. In SVT, other areas of the heart send out signals that get in the way of the signal from the sinoatrial node. What increases the risk? You are more likely to develop this condition if you are:  31-19 years old.  A woman. The following factors may make you more likely to develop this condition:  Stress.  Tiredness.  Smoking.  Stimulant drugs, such as cocaine and methamphetamine.  Alcohol.  Caffeine.  Pregnancy.  Feeling worried or nervous (anxiety). What are the signs or symptoms?  A pounding heart.  A feeling that your heart is skipping beats (palpitations).  Weakness.  Trouble getting enough air.  Pain or tightness in your chest.  Feeling like you are going to pass out (faint).  Feeling worried or nervous.  Dizziness.  Sweating.  Feeling sick to your stomach (nausea).  Passing out.  Tiredness. Sometimes, there are no symptoms. How is this treated?  Vagal nerve stimulation. Ways to do this include: ? Holding your breath and pushing, as though  you are pooping (having a bowel movement). ? Massaging an area on one side of your neck. Do not try this yourself. Only a doctor should do this. If done the wrong way, it can lead to a stroke. ? Bending forward with your head between your legs. ? Coughing while bending forward with your head between your legs. ? Closing your eyes and massaging your eyeballs. Ask a doctor how to do this.  Medicines that prevent attacks.  Medicine  to stop an attack given through an IV tube at the hospital.  A small electric shock (cardioversion) that stops an attack.  Radiofrequency ablation. In this procedure, a small, thin tube (catheter) is used to send energy to the area that is causing the rapid heartbeats. If you do not have symptoms, you may not need treatment. Follow these instructions at home: Stress  Avoid things that make you feel stressed.  To deal with stress, try: ? Doing yoga or meditation, or being out in nature. ? Listening to relaxing music. ? Doing deep breathing. ? Taking steps to be healthy, such as getting lots of sleep, exercising, and eating a balanced diet. ? Talking with a mental health doctor. Lifestyle   Try to get at least 7 hours of sleep each night.  Do not use any products that contain nicotine or tobacco, such as cigarettes, e-cigarettes, and chewing tobacco. If you need help quitting, ask your doctor.  Be aware of how alcohol affects you. ? If alcohol gives you a fast heartbeat, do not drink alcohol. ? If alcohol does not seem to give you a fast heartbeat, limit alcohol use to no more than 1 drink a day for women who are not pregnant, and 2 drinks a day for men. In the [place] one drink is one of these: ? 12 oz of beer (355 mL). ? 5 oz of wine (148 mL). ? 1 oz of hard liquor (44 mL).  Be aware of how caffeine affects you. ? If caffeine gives you a fast heartbeat, do not eat, drink, or use anything with caffeine in it. ? If caffeine does not seem to give you a fast heartbeat, limit how much caffeine you eat, drink, or use.  Do not use stimulant drugs. If you need help quitting, ask your doctor. General instructions  Stay at a healthy weight.  Exercise regularly. Ask your doctor about good activities for you. Try one or a mixture of these: ? 150 minutes a week of gentle exercise, like walking or yoga. ? 75 minutes a week of exercise that is very active, like running or swimming.  Do  vagus nerve treatments to slow down your heartbeat as told by your doctor.  Take over-the-counter and prescription medicines only as told by your doctor.  Keep all follow-up visits as told by your doctor. This is important. Contact a doctor if:  You have a fast heartbeat more often.  Times of having a fast heartbeat last longer than before.  Home treatments to slow down your heartbeat do not help.  You have new symptoms. Get help right away if:  You have chest pain.  Your symptoms get worse.  You have trouble breathing.  Your heart beats very fast for more than 20 minutes.  You pass out. These symptoms may be an emergency. Do not wait to see if the symptoms will go away. Get medical help right away. Call your local emergency services (911 in the [place] Do not drive yourself to the hospital. Summary  SVT is a type of abnormal heart beat.  This condition can make your heart beat more than 150 times a minute.  Treatment depends on how often the condition happens and your symptoms. This information is not intended to replace advice given to you by your health care provider. Make sure you discuss any questions you have with your health care provider. Document Released: 02/17/2005 Document Revised: 01/05/2018 Document Reviewed: 01/05/2018 Elsevier Patient Education  [place].    Echocardiogram An echocardiogram is a procedure that uses painless sound waves (ultrasound) to produce an image of the heart. Images from an echocardiogram can provide important information about:  Signs of coronary artery disease (CAD).  Aneurysm detection. An aneurysm is a weak or damaged part of an artery wall that bulges out from the normal force of blood pumping through the body.  Heart size and shape. Changes in the size or shape of the heart can be associated with certain conditions, including heart failure, aneurysm, and CAD.  Heart muscle function.  Heart valve function.  Signs  of a past heart attack.  Fluid buildup around the heart.  Thickening of the heart muscle.  A tumor or infectious growth around the heart valves. Tell a health care provider about:  Any allergies you have.  All medicines you are taking, including vitamins, herbs, eye drops, creams, and over-the-counter medicines.  Any blood disorders you have.  Any surgeries you have had.  Any medical conditions you have.  Whether you are pregnant or may be pregnant. What are the risks? Generally, this is a safe procedure. However, problems may occur, including:  Allergic reaction to dye (contrast) that may be used during the procedure. What happens before the procedure? No specific preparation is needed. You may eat and drink normally. What happens during the procedure?   An IV tube may be inserted into one of your veins.  You may receive contrast through this tube. A contrast is an injection that improves the quality of the pictures from your heart.  A gel will be applied to your chest.  A wand-like tool (transducer) will be moved over your chest. The gel will help to transmit the sound waves from the transducer.  The sound waves will harmlessly bounce off of your heart to allow the heart images to be captured in real-time motion. The images will be recorded on a computer. The procedure may vary among health care providers and hospitals. What happens after the procedure?  You may return to your normal, everyday life, including diet, activities, and medicines, unless your health care provider tells you not to do that. Summary  An echocardiogram is a procedure that uses painless sound waves (ultrasound) to produce an image of the heart.  Images from an echocardiogram can provide important information about the size and shape of your heart, heart muscle function, heart valve function, and fluid buildup around your heart.  You do not need to do anything to prepare before this procedure.  You may eat and drink normally.  After the echocardiogram is completed, you may return to your normal, everyday life, unless your health care provider tells you not to do that. This information is not intended to replace advice given to you by your health care provider. Make sure you discuss any questions you have with your health care provider. Document Released: 02/15/2000 Document Revised: 06/10/2018 Document Reviewed: 03/22/2016 Elsevier Patient Education  2020 [place], PA-C  Any Other Special Instructions Will  Be Listed Below (If Applicable).  For educational health videos Log in to : [email or web address] Or : [email or web address], password : [email or web address]

## 2018-12-09 ENCOUNTER — Encounter: Payer: Self-pay | Admitting: Gynecology

## 2018-12-13 ENCOUNTER — Ambulatory Visit (INDEPENDENT_AMBULATORY_CARE_PROVIDER_SITE_OTHER): Payer: 59

## 2018-12-13 ENCOUNTER — Other Ambulatory Visit: Payer: Self-pay

## 2018-12-13 DIAGNOSIS — R0602 Shortness of breath: Secondary | ICD-10-CM

## 2018-12-13 DIAGNOSIS — R079 Chest pain, unspecified: Secondary | ICD-10-CM | POA: Diagnosis not present

## 2018-12-16 ENCOUNTER — Telehealth: Payer: Self-pay | Admitting: Cardiovascular Disease

## 2018-12-16 NOTE — Telephone Encounter (Signed)
I called to speak with the patient about her normal echo results per Dr. Rockey Situ.  She proceeded to tell me that she will have chest pressure at times with some pain behind her left shoulder blade. She will also have "sweats" that she typically notices when she wakes up in the morning.  She states she has been having these "for awhile" and she forgot to mention them to Dr. Rockey Situ at her last visit.  I advised her she had a Calcium score of 0 in 2016 and a normal stress test 07/2017.  I stated to her that her chances of having any type of blockage at this point that his causing her symptoms if very low. She does not associate symptoms with her SVT. I advised waking up in the morning with sweating does not sound cardiac related.  She states she mostly notices her symptoms, aside from the sweating, at night when she lays down.  I asked if her symptoms were the same as when she had a her stress test a year ago and she stated "not all of them."  She is aware I will send to Dr. Rockey Situ to review and we will call back with recommendations.  I again advised her I felt like she was low risk at this time for any issues related to a blockage.

## 2018-12-16 NOTE — Telephone Encounter (Signed)
Long history of atypical chest pain No further work-up needed Significant stressors at home likely contributing to symptoms Last clinic visit we discussed the stressors I believe she has benzodiazepine she can take twice a day for stress/anxiety is contributing to her symptoms

## 2018-12-20 NOTE — Telephone Encounter (Signed)
I called and spoke with the patient and advised her of Dr. Donivan Scull recommendations. She voiced understanding and was agreeable.

## 2018-12-21 NOTE — Patient Instructions (Addendum)
Take calcium 600 mg twice a day with food.    Tests ordered today. Your results will be released to [facility] (or called to you) after review.  If any changes need to be made, you will be notified at that same time.  All other Health Maintenance issues reviewed.   All recommended immunizations and age-appropriate screenings are up-to-date or discussed.  Flu immunization administered today.    Medications reviewed and updated.  Changes include :   none  Your prescription(s) have been submitted to your pharmacy. Please take as directed and contact our office if you believe you are having problem(s) with the medication(s).   Please followup in 6 months    Health Maintenance, Female Adopting a healthy lifestyle and getting preventive care are important in promoting health and wellness. Ask your health care provider about:  The right schedule for you to have regular tests and exams.  Things you can do on your own to prevent diseases and keep yourself healthy. What should I know about diet, weight, and exercise? Eat a healthy diet   Eat a diet that includes plenty of vegetables, fruits, low-fat dairy products, and lean protein.  Do not eat a lot of foods that are high in solid fats, added sugars, or sodium. Maintain a healthy weight Body mass index (BMI) is used to identify weight problems. It estimates body fat based on height and weight. Your health care provider can help determine your BMI and help you achieve or maintain a healthy weight. Get regular exercise Get regular exercise. This is one of the most important things you can do for your health. Most adults should:  Exercise for at least 150 minutes each week. The exercise should increase your heart rate and make you sweat (moderate-intensity exercise).  Do strengthening exercises at least twice a week. This is in addition to the moderate-intensity exercise.  Spend less time sitting. Even light physical activity can be  beneficial. Watch cholesterol and blood lipids Have your blood tested for lipids and cholesterol at 75 years of age, then have this test every 5 years. Have your cholesterol levels checked more often if:  Your lipid or cholesterol levels are high.  You are older than 75 years of age.  You are at high risk for heart disease. What should I know about cancer screening? Depending on your health history and family history, you may need to have cancer screening at various ages. This may include screening for:  Breast cancer.  Cervical cancer.  Colorectal cancer.  Skin cancer.  Lung cancer. What should I know about heart disease, diabetes, and high blood pressure? Blood pressure and heart disease  High blood pressure causes heart disease and increases the risk of stroke. This is more likely to develop in people who have high blood pressure readings, are of African descent, or are overweight.  Have your blood pressure checked: ? Every 3-5 years if you are 62-33 years of age. ? Every year if you are 9 years old or older. Diabetes Have regular diabetes screenings. This checks your fasting blood sugar level. Have the screening done:  Once every three years after age 59 if you are at a normal weight and have a low risk for diabetes.  More often and at a younger age if you are overweight or have a high risk for diabetes. What should I know about preventing infection? Hepatitis B If you have a higher risk for hepatitis B, you should be screened for this virus. Talk  with your health care provider to find out if you are at risk for hepatitis B infection. Hepatitis C Testing is recommended for:  Everyone born from [birth year] through 1967.  Anyone with known risk factors for hepatitis C. Sexually transmitted infections (STIs)  Get screened for STIs, including gonorrhea and chlamydia, if: ? You are sexually active and are younger than 75 years of age. ? You are older than 75 years of age and  your health care provider tells you that you are at risk for this type of infection. ? Your sexual activity has changed since you were last screened, and you are at increased risk for chlamydia or gonorrhea. Ask your health care provider if you are at risk.  Ask your health care provider about whether you are at high risk for HIV. Your health care provider may recommend a prescription medicine to help prevent HIV infection. If you choose to take medicine to prevent HIV, you should first get tested for HIV. You should then be tested every 3 months for as long as you are taking the medicine. Pregnancy  If you are about to stop having your period (premenopausal) and you may become pregnant, seek counseling before you get pregnant.  Take 400 to 800 micrograms (mcg) of folic acid every day if you become pregnant.  Ask for birth control (contraception) if you want to prevent pregnancy. Osteoporosis and menopause Osteoporosis is a disease in which the bones lose minerals and strength with aging. This can result in bone fractures. If you are 20 years old or older, or if you are at risk for osteoporosis and fractures, ask your health care provider if you should:  Be screened for bone loss.  Take a calcium or vitamin D supplement to lower your risk of fractures.  Be given hormone replacement therapy (HRT) to treat symptoms of menopause. Follow these instructions at home: Lifestyle  Do not use any products that contain nicotine or tobacco, such as cigarettes, e-cigarettes, and chewing tobacco. If you need help quitting, ask your health care provider.  Do not use street drugs.  Do not share needles.  Ask your health care provider for help if you need support or information about quitting drugs. Alcohol use  Do not drink alcohol if: ? Your health care provider tells you not to drink. ? You are pregnant, may be pregnant, or are planning to become pregnant.  If you drink alcohol: ? Limit how  much you use to 0-1 drink a day. ? Limit intake if you are breastfeeding.  Be aware of how much alcohol is in your drink. In the [place] one drink equals one 12 oz bottle of beer (355 mL), one 5 oz glass of wine (148 mL), or one 1 oz glass of hard liquor (44 mL). General instructions  Schedule regular health, dental, and eye exams.  Stay current with your vaccines.  Tell your health care provider if: ? You often feel depressed. ? You have ever been abused or do not feel safe at home. Summary  Adopting a healthy lifestyle and getting preventive care are important in promoting health and wellness.  Follow your health care provider's instructions about healthy diet, exercising, and getting tested or screened for diseases.  Follow your health care provider's instructions on monitoring your cholesterol and blood pressure. This information is not intended to replace advice given to you by your health care provider. Make sure you discuss any questions you have with your health care provider. Document Released: 09/02/2010  Document Revised: 02/10/2018 Document Reviewed: 02/10/2018 Elsevier Patient Education  2020 [employer].

## 2018-12-21 NOTE — Progress Notes (Signed)
Subjective:    Patient ID: [name], female    DOB: May 28, 1945, 73 y.o.   MRN: [id number]  HPI She is here for a physical exam.   She continues to have a lot of stress related to family stresses.    Medications and allergies reviewed with patient and updated if appropriate.  Patient Active Problem List   Diagnosis Date Noted  . DDD (degenerative disc disease), cervical 09/24/2018  . Pain in left knee 03/17/2018  . Conductive hearing loss, bilateral 03/10/2018  . Prediabetes 12/17/2017  . Neck pain 04/28/2017  . Dizziness 02/26/2017  . B12 deficiency 12/16/2016  . Chest discomfort 05/07/2016  . Back pain 05/07/2016  . Asthma 04/22/2016  . Family history of diabetes mellitus (DM) 12/07/2015  . Constipation due to outlet dysfunction   . Obstructive sleep apnea 07/12/2015  . Seasonal and perennial allergic rhinitis 07/12/2015  . Vitamin D deficiency 12/05/2014  . Rhinitis, chronic 05/30/2014    Class: Chronic  . Vaginal atrophy 10/20/2012  . Insomnia 10/20/2012  . Orthostatic hypotension 02/16/2012  . Cough 02/13/2011  . Palpitations 12/31/2010  . GERD (gastroesophageal reflux disease) 07/25/2010  . Lymphocytic colitis 06/14/2010  . Depression   . BACK PAIN, CHRONIC 12/27/2009  . Osteoporosis 12/27/2009  . SOB (shortness of breath) 06/25/2009  . Hereditary and idiopathic peripheral neuropathy 12/22/2007  . Hyperlipidemia 03/09/2007  . Generalized anxiety disorder 03/09/2007  . MIGRAINE HEADACHE 03/09/2007  . IBS 03/09/2007  . SPONDYLOSIS 03/09/2007  . Myalgia and myositis 03/09/2007  . SCHATZKI'S RING, HX OF 03/09/2007  . Diaphragmatic hernia 12/30/2006  . GASTRITIS 02/17/2005  . DIVERTICULOSIS OF COLON 01/01/2000    Current Outpatient Medications on File Prior to Visit  Medication Sig Dispense Refill  . albuterol (PROVENTIL HFA;VENTOLIN HFA) 108 (90 Base) MCG/ACT inhaler Inhale 2 puffs into the lungs every 6 (six) hours as needed for  wheezing or shortness of breath. 1 Inhaler 5  . Azelastine-Fluticasone (DYMISTA) 137-50 MCG/ACT SUSP Place 2 sprays into the nose daily.     [name] b complex vitamins capsule Take 1 capsule by mouth daily.    . Biotin 1000 MCG tablet Take 1,000 mcg by mouth daily.      . Cholecalciferol 25 MCG (1000 UT) tablet Take 1 tablet (1,000 Units total) by mouth daily. 90 tablet 3  . fluticasone furoate-vilanterol (BREO ELLIPTA) 200-25 MCG/INH AEPB Inhale 1 puff into the lungs daily. 1 each 8  . Magnesium Oxide 250 MG TABS Take 250 mg by mouth daily.     . Probiotic Product (ALIGN) 4 MG CAPS Take 1 capsule by mouth daily.     [name] tretinoin (RETIN-A) 0.025 % cream APPLY 1 APPLICATION TO AFFECTED AREA AT BEDTIME  99  . vitamin C (ASCORBIC ACID) 500 MG tablet Take 500 mg by mouth daily.    . [DISCONTINUED] colesevelam (WELCHOL) 625 MG tablet Take 625 mg by mouth 2 (two) times daily with a meal. Take one tablet by mouth twice a day    . [DISCONTINUED] Hyoscyamine-Phenyltoloxamine ([place] NF) [id number] MG CAPS Take one tablet by mouth as needed for abdominal cramping 24 each 0   No current facility-administered medications on file prior to visit.     Past Medical History:  Diagnosis Date  . Anxiety   . Complication of anesthesia   . Depression    generalized anxiety disorder  . Diverticulosis   . Duodenal diverticulum   . Family history of adverse reaction to anesthesia    Mother  and Daughters- N/V  . Fibromyalgia   . Gastroesophageal reflux disease with hiatal hernia   . Hiatal hernia   . History of kidney stones   . Hyperlipidemia   . IBS (irritable bowel syndrome)   . Lymphocytic colitis    Dr Sharlett Iles  . Migraine headache   . Mitral valve prolapse   . Neuropathy   . Osteopenia    BMD ordered by GYN  . Peripheral neuropathy    treated as RLS by  Neurology  . PONV (postoperative nausea and vomiting)   . Restless leg syndrome   . Schatzki's ring    History of  . Sleep apnea    On CPAP, has  not been using  . Spondylosis     Past Surgical History:  Procedure Laterality Date  . ANAL RECTAL MANOMETRY N/A 11/14/2015   Procedure: ANO RECTAL MANOMETRY;  Surgeon: Gatha Mayer, MD;  Location: [facility];  Service: Endoscopy;  Laterality: N/A;  . BREAST ENHANCEMENT SURGERY    . CATARACT EXTRACTION Bilateral   . cataract surgery Left    Dr Bing Plume  . CHOLECYSTECTOMY    . COLONOSCOPY    . ESOPHAGEAL DILATION     X 2  . ROTATOR CUFF REPAIR Left   . SINUS ENDO W/FUSION N/A 05/30/2014   Procedure: REVISION  FRONTAL SINUS SURGERY WITH FUSION SCAN;  Surgeon: Jerrell Belfast, MD;  Location: [facility];  Service: ENT;  Laterality: N/A;  . SINUS SURGERY WITH INSTATRAK     x 2  . TUBAL LIGATION    . Vaginal cystectomy     x 2  . VAGINAL HYSTERECTOMY  05/2007   Vaginal repair, Dr Ubaldo Glassing.  Partial  hysterectomy.    Social History   Socioeconomic History  . Marital status: Married    Spouse name: Not on file  . Number of children: 2  . Years of education: 89  . Highest education level: Not on file  Occupational History  . Occupation: retired    Fish farm manager: AT [employer]  . Financial resource strain: Not on file  . Food insecurity    Worry: Not on file    Inability: Not on file  . Transportation needs    Medical: Not on file    Non-medical: Not on file  Tobacco Use  . Smoking status: Former Smoker    Packs/day: 0.50    Years: 15.00    Pack years: 7.50    Types: Cigarettes    Quit date: 03/03/1998    Years since quitting: 20.8  . Smokeless tobacco: Never Used  . Tobacco comment: smoked 1973- 2006, up to <  1  ppd  Substance and Sexual Activity  . Alcohol use: No    Alcohol/week: 0.0 standard drinks  . Drug use: No  . Sexual activity: Yes    Partners: Male    Comment: 1st intercourse- 89, partners- 2, married- 65 yrs   Lifestyle  . Physical activity    Days per week: Not on file    Minutes per session: Not on file  . Stress: Not on file  Relationships  . Social  Herbalist on phone: Not on file    Gets together: Not on file    Attends religious service: Not on file    Active member of club or organization: Not on file    Attends meetings of clubs or organizations: Not on file    Relationship status: Not on file  Other Topics Concern  . Not on file  Social History Narrative   HAS REGULAR EXERCISE   DAILY CAFFEINE: 1-2 CUPS   Patient is right handed.    Family History  Problem Relation Age of Onset  . Hypertension Father 34  . Stroke Father 84  . Heart attack Father         ? in 41s  . Hypertension Mother   . Neuropathy Mother   . Anemia Mother   . Coronary artery disease Brother        Stent placement in 60s  . Diabetes Maternal Uncle   . Heart attack Paternal [place] , ? age  . Colon cancer Neg Hx   . Seizures Neg Hx     Review of Systems  Constitutional: Positive for diaphoresis. Negative for chills and fever.  Eyes: Negative for visual disturbance.  Respiratory: Negative for cough, shortness of breath and wheezing.   Cardiovascular: Positive for chest pain (intermittent - cardia w/u negative), palpitations and leg swelling (mild - neuropathy).  Gastrointestinal: Positive for constipation (intermittent, takes metamucil) and diarrhea (intermittent, takes metamucil). Negative for abdominal pain, blood in stool and nausea.       No gerd - controlled  Genitourinary: Negative for dysuria and hematuria.  Musculoskeletal: Positive for arthralgias, back pain (chronic) and neck pain (chronic).  Skin: Negative for color change and rash.  Neurological: Positive for numbness (feet - neuropathy - numbness, pain) and headaches (occ).  Psychiatric/Behavioral: Positive for dysphoric mood. The patient is nervous/anxious.        Objective:   Vitals:   12/22/18 0855  BP: 112/70  Pulse: (!) 57  Resp: 16  Temp: 98.5 F (36.9 C)  SpO2: 99%   Filed Weights   12/22/18 0855  Weight: 130 lb (59 kg)   Body mass index  is 20.06 kg/m.  BP Readings from Last 3 Encounters:  12/22/18 112/70  12/07/18 100/64  08/30/18 100/60    Wt Readings from Last 3 Encounters:  12/22/18 130 lb (59 kg)  12/07/18 132 lb 12 oz (60.2 kg)  08/30/18 127 lb 8 oz (57.8 kg)     Physical Exam Constitutional: She appears well-developed and well-nourished. No distress.  HENT:  Head: Normocephalic and atraumatic.  Right Ear: External ear normal. Normal ear canal and TM Left Ear: External ear normal.  Normal ear canal and TM Mouth/Throat: Oropharynx is clear and moist.  Eyes: Conjunctivae and EOM are normal.  Neck: Neck supple. No tracheal deviation present. No thyromegaly present.  No carotid bruit  Cardiovascular: Normal rate, regular rhythm and normal heart sounds.   No murmur heard.  No edema. Pulmonary/Chest: Effort normal and breath sounds normal. No respiratory distress. She has no wheezes. She has no rales.  Breast: deferred   Abdominal: Soft. She exhibits no distension. There is no tenderness.  Lymphadenopathy: She has no cervical adenopathy.  Skin: Skin is warm and dry. She is not diaphoretic.  Psychiatric: She has a normal mood and affect. Her behavior is normal.        Assessment & Plan:   Physical exam: Screening blood work    ordered Immunizations  Flu vaccine today, discussed tdap - given today Colonoscopy  Up to date  Mammogram  Up to date  Gyn  Up to date  Dexa  Up to date  Eye exams  Up to date  Exercise  Yard work, walking daily  Weight   normal BMI Substance  abuse  none  See Problem List for Assessment and Plan of chronic medical problems.   FU in 6 months

## 2018-12-22 ENCOUNTER — Encounter: Payer: Self-pay | Admitting: Internal Medicine

## 2018-12-22 ENCOUNTER — Other Ambulatory Visit (INDEPENDENT_AMBULATORY_CARE_PROVIDER_SITE_OTHER): Payer: 59

## 2018-12-22 ENCOUNTER — Ambulatory Visit (INDEPENDENT_AMBULATORY_CARE_PROVIDER_SITE_OTHER): Payer: 59 | Admitting: Internal Medicine

## 2018-12-22 ENCOUNTER — Other Ambulatory Visit: Payer: Self-pay

## 2018-12-22 VITALS — BP 112/70 | HR 57 | Temp 98.5°F | Resp 16 | Ht 67.5 in | Wt 130.0 lb

## 2018-12-22 DIAGNOSIS — Z0000 Encounter for general adult medical examination without abnormal findings: Secondary | ICD-10-CM | POA: Diagnosis not present

## 2018-12-22 DIAGNOSIS — Z23 Encounter for immunization: Secondary | ICD-10-CM | POA: Diagnosis not present

## 2018-12-22 DIAGNOSIS — F411 Generalized anxiety disorder: Secondary | ICD-10-CM | POA: Diagnosis not present

## 2018-12-22 DIAGNOSIS — M810 Age-related osteoporosis without current pathological fracture: Secondary | ICD-10-CM

## 2018-12-22 DIAGNOSIS — J45909 Unspecified asthma, uncomplicated: Secondary | ICD-10-CM

## 2018-12-22 DIAGNOSIS — R7303 Prediabetes: Secondary | ICD-10-CM

## 2018-12-22 DIAGNOSIS — K219 Gastro-esophageal reflux disease without esophagitis: Secondary | ICD-10-CM

## 2018-12-22 DIAGNOSIS — E559 Vitamin D deficiency, unspecified: Secondary | ICD-10-CM

## 2018-12-22 DIAGNOSIS — F3289 Other specified depressive episodes: Secondary | ICD-10-CM

## 2018-12-22 LAB — COMPREHENSIVE METABOLIC PANEL
ALT: 12 U/L (ref 0–35)
AST: 18 U/L (ref 0–37)
Albumin: 4.4 g/dL (ref 3.5–5.2)
Alkaline Phosphatase: 64 U/L (ref 39–117)
BUN: 11 mg/dL (ref 6–23)
CO2: 29 mEq/L (ref 19–32)
Calcium: 9.5 mg/dL (ref 8.4–10.5)
Chloride: 106 mEq/L (ref 96–112)
Creatinine, Ser: 0.67 mg/dL (ref 0.40–1.20)
GFR: 85.84 mL/min (ref >60.00)
Glucose, Bld: 97 mg/dL (ref 70–99)
Potassium: 4.1 mEq/L (ref 3.5–5.1)
Sodium: 141 mEq/L (ref 135–145)
Total Bilirubin: 0.6 mg/dL (ref 0.2–1.2)
Total Protein: 6.8 g/dL (ref 6.0–8.3)

## 2018-12-22 LAB — CBC WITH DIFFERENTIAL/PLATELET
Basophils Absolute: 0.1 10*3/uL (ref 0.0–0.1)
Basophils Relative: 1.4 % (ref 0.0–3.0)
Eosinophils Absolute: 0.1 10*3/uL (ref 0.0–0.7)
Eosinophils Relative: 2 % (ref 0.0–5.0)
HCT: 40.9 % (ref 36.0–46.0)
Hemoglobin: 13.4 g/dL (ref 12.0–15.0)
Lymphocytes Relative: 26.2 % (ref 12.0–46.0)
Lymphs Abs: 1.3 10*3/uL (ref 0.7–4.0)
MCHC: 32.7 g/dL (ref 30.0–36.0)
MCV: 92 fl (ref 78.0–100.0)
Monocytes Absolute: 0.4 10*3/uL (ref 0.1–1.0)
Monocytes Relative: 8.6 % (ref 3.0–12.0)
Neutro Abs: 3 10*3/uL (ref 1.4–7.7)
Neutrophils Relative %: 61.8 % (ref 43.0–77.0)
Platelets: 252 10*3/uL (ref 150.0–400.0)
RBC: 4.45 Mil/uL (ref 3.87–5.11)
RDW: 14.6 % (ref 11.5–15.5)
WBC: 4.9 10*3/uL (ref 4.0–10.5)

## 2018-12-22 LAB — LIPID PANEL
Cholesterol: 215 mg/dL — ABNORMAL HIGH (ref 0–200)
HDL: 66 mg/dL (ref >39.00)
LDL Cholesterol: 128 mg/dL — ABNORMAL HIGH (ref 0–99)
NonHDL: 149
Total CHOL/HDL Ratio: 3
Triglycerides: 106 mg/dL (ref 0.0–149.0)
VLDL: 21.2 mg/dL (ref 0.0–40.0)

## 2018-12-22 LAB — HEMOGLOBIN A1C: Hgb A1c MFr Bld: 5.8 % (ref 4.6–6.5)

## 2018-12-22 LAB — TSH: TSH: 2.94 u[IU]/mL (ref 0.35–4.50)

## 2018-12-22 LAB — VITAMIN D 25 HYDROXY (VIT D DEFICIENCY, FRACTURES): VITD: 29.77 ng/mL — ABNORMAL LOW (ref 30.00–100.00)

## 2018-12-22 MED ORDER — CLONAZEPAM 0.5 MG PO TABS: 0.5000 mg | ORAL_TABLET | Freq: Two times a day (BID) | ORAL | 1 refills | Status: DC

## 2018-12-22 MED ORDER — PANTOPRAZOLE SODIUM 40 MG PO TBEC: 40.0000 mg | DELAYED_RELEASE_TABLET | Freq: Every day | ORAL | 1 refills | Status: DC | PRN

## 2018-12-22 NOTE — Assessment & Plan Note (Signed)
Taking vitamin d Check level 

## 2018-12-22 NOTE — Assessment & Plan Note (Signed)
Takes PPI prn

## 2018-12-22 NOTE — Assessment & Plan Note (Signed)
High anxiety/stress - it is taking a toll on her Has clonazepam bid prn - not taking regularly and needs to take more Does not take it as much as she should Would benefit from an SSRI, but does not want to take one

## 2018-12-22 NOTE — Assessment & Plan Note (Signed)
Uses albuterol prn 

## 2018-12-22 NOTE — Assessment & Plan Note (Signed)
Check a1c Low sugar / carb diet Stressed regular exercise   

## 2018-12-22 NOTE — Assessment & Plan Note (Signed)
Not on medication - does not want to take an SSRI Continue regular exercise and natural ways of compensating

## 2018-12-22 NOTE — Assessment & Plan Note (Signed)
dexa up to date Saw Dr Cruzita Lederer - does not want to take bisphosphonates or injections Taking vitamin d daily Walking regularly

## 2018-12-23 ENCOUNTER — Ambulatory Visit: Payer: 59 | Admitting: Neurology

## 2018-12-23 ENCOUNTER — Encounter: Payer: Self-pay | Admitting: Neurology

## 2018-12-23 VITALS — BP 97/58 | HR 65 | Temp 98.0°F | Ht 67.5 in | Wt 132.3 lb

## 2018-12-23 DIAGNOSIS — G609 Hereditary and idiopathic neuropathy, unspecified: Secondary | ICD-10-CM

## 2018-12-23 MED ORDER — CARBAMAZEPINE 100 MG PO CHEW: CHEWABLE_TABLET | ORAL | 3 refills | Status: DC

## 2018-12-23 NOTE — Progress Notes (Signed)
Reason for visit: Peripheral neuropathy  Referring physician: Dr. Jennelle Human [name] is a 73 y.o. female  History of present illness:  [name] is a 73 year old right-handed white female with a history of a peripheral neuropathy.  The patient was seen here 4 years ago with similar problems, she has had a lot of difficulty tolerating any medications.  She could not tolerate gabapentin, Lyrica, or Cymbalta.  The patient has had gradual worsening of her discomfort, she has tingling and burning sensations in the feet bilaterally.  She is starting to get similar problems in the hands.  Occasionally she may have shock sensations in the feet.  She denies any significant balance issues, but she does fall on occasion.  The patient reports some history of low back pain and neck pain.  She has a history of colitis and has some diarrhea on occasion.  She is sent back to this office for further management of the neuropathy issues.  Past Medical History:  Diagnosis Date  . Anxiety   . Complication of anesthesia   . Depression    generalized anxiety disorder  . Diverticulosis   . Duodenal diverticulum   . Family history of adverse reaction to anesthesia    Mother and Daughters- N/V  . Fibromyalgia   . Gastroesophageal reflux disease with hiatal hernia   . Hiatal hernia   . History of kidney stones   . Hyperlipidemia   . IBS (irritable bowel syndrome)   . Lymphocytic colitis    Dr Sharlett Iles  . Migraine headache   . Mitral valve prolapse   . Neuropathy   . Osteopenia    BMD ordered by GYN  . Peripheral neuropathy    treated as RLS by  Neurology  . PONV (postoperative nausea and vomiting)   . Restless leg syndrome   . Schatzki's ring    History of  . Sleep apnea    On CPAP, has not been using  . Spondylosis     Past Surgical History:  Procedure Laterality Date  . ANAL RECTAL MANOMETRY N/A 11/14/2015   Procedure: ANO RECTAL MANOMETRY;  Surgeon: Gatha Mayer, MD;  Location: [facility];  Service: Endoscopy;  Laterality: N/A;  . BREAST ENHANCEMENT SURGERY    . CATARACT EXTRACTION Bilateral   . cataract surgery Left    Dr Bing Plume  . CHOLECYSTECTOMY    . COLONOSCOPY    . ESOPHAGEAL DILATION     X 2  . ROTATOR CUFF REPAIR Left   . SINUS ENDO W/FUSION N/A 05/30/2014   Procedure: REVISION  FRONTAL SINUS SURGERY WITH FUSION SCAN;  Surgeon: Jerrell Belfast, MD;  Location: [facility];  Service: ENT;  Laterality: N/A;  . SINUS SURGERY WITH INSTATRAK     x 2  . TUBAL LIGATION    . Vaginal cystectomy     x 2  . VAGINAL HYSTERECTOMY  05/2007   Vaginal repair, Dr Ubaldo Glassing.  Partial  hysterectomy.    Family History  Problem Relation Age of Onset  . Hypertension Father 48  . Stroke Father 58  . Heart attack Father         ? in 74s  . Hypertension Mother   . Neuropathy Mother   . Anemia Mother   . Coronary artery disease Brother        Stent placement in 60s  . Diabetes Maternal Uncle   . Heart attack Paternal Uncle        SEVEN , ?  age  . Colon cancer Neg Hx   . Seizures Neg Hx     Social history:  reports that she quit smoking about 20 years ago. Her smoking use included cigarettes. She has a 7.50 pack-year smoking history. She has never used smokeless tobacco. She reports that she does not drink alcohol or use drugs.  Medications:  Prior to Admission medications   Medication Sig Start Date End Date Taking? Authorizing Provider  Azelastine-Fluticasone (DYMISTA) 137-50 MCG/ACT SUSP Place 2 sprays into the nose daily.    Yes [provider]  albuterol (PROVENTIL HFA;VENTOLIN HFA) 108 (90 Base) MCG/ACT inhaler Inhale 2 puffs into the lungs every 6 (six) hours as needed for wheezing or shortness of breath. 08/29/16   Collene Gobble, MD  b complex vitamins capsule Take 1 capsule by mouth daily.    [provider]  Biotin 1000 MCG tablet Take 1,000 mcg by mouth daily.      [provider]  Cholecalciferol 25 MCG (1000  UT) tablet Take 1 tablet (1,000 Units total) by mouth daily. 02/01/18   [name], MD  clonazePAM (KLONOPIN) 0.5 MG tablet Take 1 tablet (0.5 mg total) by mouth 2 (two) times daily. 12/22/18   Burns, Claudina Lick, MD  fluticasone furoate-vilanterol (BREO ELLIPTA) 200-25 MCG/INH AEPB Inhale 1 puff into the lungs daily. 05/25/18   [name], MD  Magnesium Oxide 250 MG TABS Take 250 mg by mouth daily.     [provider]  pantoprazole (PROTONIX) 40 MG tablet Take 1 tablet (40 mg total) by mouth daily as needed. 12/22/18   [name], MD  Probiotic Product (ALIGN) 4 MG CAPS Take 1 capsule by mouth daily.     [provider]  tretinoin (RETIN-A) 0.025 % cream APPLY 1 APPLICATION TO AFFECTED AREA AT BEDTIME 11/09/17   [provider]  vitamin C (ASCORBIC ACID) 500 MG tablet Take 500 mg by mouth daily.    [provider]  colesevelam (WELCHOL) 625 MG tablet Take 625 mg by mouth 2 (two) times daily with a meal. Take one tablet by mouth twice a day 04/16/11 05/20/11  Sable Feil, MD  Hyoscyamine-Phenyltoloxamine [facility] NF) [phone] MG CAPS Take one tablet by mouth as needed for abdominal cramping 04/15/11 05/20/11  Sable Feil, MD      Allergies  Allergen Reactions  . Adhesive [Tape] Rash  . Albuterol     heartburn  . Cymbalta [Duloxetine Hcl]     Diarrhea, nausea, anxiety worse, shaky  . Lyrica [Pregabalin] Other (See Comments)    Sedation  . Neurontin [Gabapentin] Other (See Comments)    Sedation  . Nitrofuran Derivatives Other (See Comments)    "tingling"  . Paxil [Paroxetine Hcl]     Bowel upset, tingling  . Prozac [Fluoxetine Hcl] Other (See Comments)    Made patient worse and constipation    ROS:  Out of a complete 14 system review of symptoms, the patient complains only of the following symptoms, and all other reviewed systems are negative.  Foot pain Anxiety, depression  Blood pressure (!) 97/58, pulse 65.  Physical Exam   General: The patient is alert and cooperative at the time of the examination.  Eyes: Pupils are equal, round, and reactive to light. Discs are flat bilaterally.  Neck: The neck is supple, no carotid bruits are noted.  Respiratory: The respiratory examination is clear.  Cardiovascular: The cardiovascular examination reveals a regular rate and rhythm, no obvious murmurs  or rubs are noted.  Skin: Extremities are without significant edema.  Neurologic Exam  Mental status: The patient is alert and oriented x 3 at the time of the examination. The patient has apparent normal recent and remote memory, with an apparently normal attention span and concentration ability.  Cranial nerves: Facial symmetry is present. There is good sensation of the face to pinprick and soft touch bilaterally. The strength of the facial muscles and the muscles to head turning and shoulder shrug are normal bilaterally. Speech is well enunciated, no aphasia or dysarthria is noted. Extraocular movements are full. Visual fields are full. The tongue is midline, and the patient has symmetric elevation of the soft palate. No obvious hearing deficits are noted.  Motor: The motor testing reveals 5 over 5 strength of all 4 extremities. Good symmetric motor tone is noted throughout.  Sensory: Sensory testing is intact to pinprick, soft touch, vibration sensation, and position sense on all 4 extremities, with exception that there may be a slight decrease in position sense in both feet.  No evidence of a stocking pattern pinprick sensory deficit was noted. No evidence of extinction is noted.  Coordination: Cerebellar testing reveals good finger-nose-finger and heel-to-shin bilaterally.  Gait and station: Gait is normal. Tandem gait is normal. Romberg is negative. No drift is seen.  Reflexes: Deep tendon reflexes are symmetric, but are depressed bilaterally. Toes are downgoing bilaterally.   Assessment/Plan:  1.  Peripheral  neuropathy  The patient is having ongoing discomfort.  The patient will go on vitamin B12, 1000 mcg daily.  She may try alpha lipoic acid or even CBD oil supplementation.  We will start low-dose carbamazepine taking 50 mg twice daily for 2 weeks and go to 100 mg twice daily.  The patient is interested in trying anodyne therapy, this was set up previously but the patient apparently never had the treatment done.  She will follow-up here in 4 months.  I have made a referral for infrared light therapy through integrative therapies.  Jill Alexanders MD 12/23/2018 2:41 PM  [facility] [address] [place], [place] [address]  Phone [phone] Fax [phone]

## 2018-12-23 NOTE — Patient Instructions (Signed)
We will start carbamazepine 100 mg tablet, take 1/2 tablet twice a day for 2 weeks, then take 1 tablet twice a day.  Go on vitamin B12 1000 mcg daily. May also try alpha lipoic acid or CBD oil for the neuropathy pain.

## 2018-12-24 MED ORDER — VITAMIN D-3 125 MCG (5000 UT) PO TABS: ORAL_TABLET | ORAL | 1 refills | Status: DC

## 2018-12-27 ENCOUNTER — Telehealth: Payer: Self-pay | Admitting: Neurology

## 2018-12-27 NOTE — Telephone Encounter (Signed)
Referral sheet in book  - Will you please print referral give referral sheet to [facility] for Dr. Jannifer Franklin to sign for [facility]  . You might see Dr. Cathren Laine name because she does the most . Just white out . Fax number on referral sheet . Thanks Danielle.

## 2018-12-30 NOTE — Telephone Encounter (Signed)
Pt has called as a result of not hearing from anyone as of yet.  Phone rep advised pt there can be a 5-10 business day turn.  Pt is asking for a call from the coordinator with an update next week

## 2019-01-03 NOTE — Telephone Encounter (Signed)
Noted  

## 2019-01-03 NOTE — Telephone Encounter (Signed)
Called patient and left her a message stating I would get her referral sent.

## 2019-01-03 NOTE — Telephone Encounter (Signed)
[name] have you sent this one yet before I call patient ?

## 2019-01-05 NOTE — Telephone Encounter (Signed)
Dr. Rexene Alberts has signed for Dr. Jannifer Franklin. Order has been provided to [place], C for processing.

## 2019-01-05 NOTE — Telephone Encounter (Signed)
Pt is calling in wanting to know where her referral will be sent   CB# [phone]

## 2019-01-05 NOTE — Telephone Encounter (Signed)
Jinny Blossom will you get work in to sign please . Thanks Hinton Dyer

## 2019-01-06 NOTE — Telephone Encounter (Signed)
Called patient and spoke to her relayed referral  Had be sent.

## 2019-01-26 ENCOUNTER — Telehealth: Payer: Self-pay | Admitting: Internal Medicine

## 2019-01-26 NOTE — Telephone Encounter (Signed)
Can take clonazepam three times a day for now.

## 2019-01-26 NOTE — Telephone Encounter (Signed)
Husband gave wife klonopin. He found the bottle after he got off the phone with me earlier. He states that it did help her for a short time with no reaction. Did not last long. Please advise.

## 2019-01-26 NOTE — Telephone Encounter (Signed)
Pt husband says wife is beside of herself because grandchild got killed, they have got to have some input to calm her down by the end of the day, please call back ASAP husband is trying his best to cope but she is going to have to have more med or different med. [phone] [name])

## 2019-01-26 NOTE — Telephone Encounter (Signed)
Pt's husband aware...

## 2019-01-26 NOTE — Telephone Encounter (Signed)
I thought she tolerated the clonazepam  - what side effects does she have from it?  We can a different daily anti-depressnat/anxiety medication that she has not had in the past, but she may have side effects, but given the circumstances I think we should try something.

## 2019-01-26 NOTE — Telephone Encounter (Signed)
Spoke with husband. Pt is having a very hard time dealing with recent death of grandson. She was very close with him and helped raise him. Pt was in the background screaming and crying. I asked husband if she has taken her clonazepam. She states she can not take it because of side effects. Husband is asking for anything to help with anxiety. Pt states that she has been on a number of things in the past but she can not take anything. Not sure if there is something that can be given to her to help with her anxiety. Please advise.

## 2019-01-26 NOTE — Telephone Encounter (Signed)
[name], can you please follow up with triage.

## 2019-01-26 NOTE — Telephone Encounter (Signed)
Copied from [place] [phone]. Topic: General - Other >> Jan 26, 2019  8:34 AM Leward Quan A wrote: Reason for CRM: Patient husband called to speak to Dr Claudine Mouton nurse about his wife they lost a grandson in a car wreck on the night of 01/25/2019 and she is taking it very hard causing her heart to race, have palpitations,  she have not rested and have been having hot and cold chills. Please call Patient husband at Ph#  [phone]

## 2019-02-03 ENCOUNTER — Encounter: Payer: 59 | Admitting: Obstetrics & Gynecology

## 2019-03-15 ENCOUNTER — Other Ambulatory Visit: Payer: Self-pay | Admitting: Internal Medicine

## 2019-03-15 ENCOUNTER — Telehealth: Payer: Self-pay | Admitting: Internal Medicine

## 2019-03-15 DIAGNOSIS — M810 Age-related osteoporosis without current pathological fracture: Secondary | ICD-10-CM

## 2019-03-15 NOTE — Telephone Encounter (Signed)
Patient requests to be called at ph# [phone] re: patient requests to have a bone density test done prior to patient having an appointment with Dr. Cruzita Lederer. Patient requests to be screened for osteoporosis prior to an appointment to go over results with Dr. Cruzita Lederer. Patient will reschedule appointment once the above has been done. Patient will not be available for any appointment on 04/25/19.

## 2019-03-15 NOTE — Telephone Encounter (Signed)
I have ordered this to be done at our [place] site.  I am not sure if the insurance will cover it since it is not 2 years from the previous.   Also, if the patient is still seen by the same OB/GYN provider, I would suggest to have the bone density done on the same machine as previous.  In that case, it will need to be ordered by the same provider.  If she is not seeing them anymore, let us go ahead with the above border.

## 2019-03-16 NOTE — Telephone Encounter (Signed)
Notified patient of message from Dr. Gherghe, patient expressed understanding and agreement. No further questions.  

## 2019-03-17 ENCOUNTER — Ambulatory Visit: Payer: 59 | Admitting: Internal Medicine

## 2019-03-22 ENCOUNTER — Other Ambulatory Visit: Payer: Self-pay | Admitting: Internal Medicine

## 2019-03-22 NOTE — Telephone Encounter (Signed)
Tried checking last refill on control database pt could not be found.Marland KitchenJohny [name]

## 2019-03-24 ENCOUNTER — Ambulatory Visit (INDEPENDENT_AMBULATORY_CARE_PROVIDER_SITE_OTHER): Payer: 59 | Admitting: Psychiatry

## 2019-03-24 DIAGNOSIS — F4323 Adjustment disorder with mixed anxiety and depressed mood: Secondary | ICD-10-CM | POA: Diagnosis not present

## 2019-03-24 NOTE — Progress Notes (Signed)
Subjective:    Patient ID: [name], female    DOB: June 16, 1945, 74 y.o.   MRN: [id number]  HPI The patient is here for an acute visit.   Her grandson unexpectedly died in Feb 06, 2019 in a car accident.  She was very close to home and has had a very difficult time since then.  She has tried a couple of different SSRIs in the past he has not tolerated them.  She was taking clonazepam as needed, but not often and I did advise that she start taking them often.  Recently she was trying to not run out and was not taking them on a regular basis.  She does not feel like they are helping.  She is depressed and anxious.  She had her first appointment with the therapist yesterday and has another appointment next week.     There are other issues in the family that are causing stress as well.  She does not know how she is going to get over this.   Medications and allergies reviewed with patient and updated if appropriate.  Patient Active Problem List   Diagnosis Date Noted  . DDD (degenerative disc disease), cervical 09/24/2018  . Pain in left knee 03/17/2018  . Conductive hearing loss, bilateral 03/10/2018  . Prediabetes 12/17/2017  . Neck pain 04/28/2017  . Dizziness 02/26/2017  . B12 deficiency 12/16/2016  . Chest discomfort 05/07/2016  . Back pain 05/07/2016  . Asthma 04/22/2016  . Family history of diabetes mellitus (DM) 12/07/2015  . Constipation due to outlet dysfunction   . Obstructive sleep apnea 07/12/2015  . Seasonal and perennial allergic rhinitis 07/12/2015  . Vitamin D deficiency 12/05/2014  . Rhinitis, chronic 05/30/2014  . Vaginal atrophy 10/20/2012  . Insomnia 10/20/2012  . Orthostatic hypotension 02/16/2012  . Cough 02/13/2011  . Palpitations 12/31/2010  . GERD (gastroesophageal reflux disease) 07/25/2010  . Lymphocytic colitis 06/14/2010  . Depression   . BACK PAIN, CHRONIC 12/27/2009  . Osteoporosis 12/27/2009  . SOB (shortness of  breath) 06/25/2009  . Hereditary and idiopathic peripheral neuropathy 12/22/2007  . Hyperlipidemia 03/09/2007  . Generalized anxiety disorder 03/09/2007  . MIGRAINE HEADACHE 03/09/2007  . IBS 03/09/2007  . SPONDYLOSIS 03/09/2007  . Myalgia and myositis 03/09/2007  . SCHATZKI'S RING, HX OF 03/09/2007  . Diaphragmatic hernia 12/30/2006  . GASTRITIS 02/17/2005  . DIVERTICULOSIS OF COLON 01/01/2000    Current Outpatient Medications on File Prior to Visit  Medication Sig Dispense Refill  . Azelastine-Fluticasone (DYMISTA) 137-50 MCG/ACT SUSP Place 2 sprays into the nose daily.     [name] b complex vitamins capsule Take 1 capsule by mouth daily.    . Biotin 1000 MCG tablet Take 1,000 mcg by mouth daily.      . carbamazepine (TEGRETOL) 100 MG chewable tablet 1/2 tablet twice a day for 2 weeks, then take 1 tablet twice a day 60 tablet 3  . Cholecalciferol (VITAMIN D-3) 125 MCG (5000 UT) TABS Take 1 tablet daily. 90 tablet 1  . clonazePAM (KLONOPIN) 0.5 MG tablet TAKE 1 TABLET BY MOUTH 2 TIMES DAILY. 60 tablet 1  . fluticasone furoate-vilanterol (BREO ELLIPTA) 200-25 MCG/INH AEPB Inhale 1 puff into the lungs daily. 1 each 8  . Lifitegrast (XIIDRA) 5 % SOLN Apply to eye.    . Magnesium Oxide 250 MG TABS Take 250 mg by mouth daily.     . pantoprazole (PROTONIX) 40 MG tablet Take 1 tablet (40 mg total) by mouth daily as  needed. 30 tablet 1  . Probiotic Product (ALIGN) 4 MG CAPS Take 1 capsule by mouth daily.     [name] tretinoin (RETIN-A) 0.025 % cream APPLY 1 APPLICATION TO AFFECTED AREA AT BEDTIME  99  . vitamin C (ASCORBIC ACID) 500 MG tablet Take 500 mg by mouth daily.    . [DISCONTINUED] colesevelam (WELCHOL) 625 MG tablet Take 625 mg by mouth 2 (two) times daily with a meal. Take one tablet by mouth twice a day    . [DISCONTINUED] Hyoscyamine-Phenyltoloxamine ([place] NF) [id number] MG CAPS Take one tablet by mouth as needed for abdominal cramping 24 each 0   No current facility-administered medications  on file prior to visit.    Past Medical History:  Diagnosis Date  . Anxiety   . Complication of anesthesia   . Depression    generalized anxiety disorder  . Diverticulosis   . Duodenal diverticulum   . Family history of adverse reaction to anesthesia    Mother and Daughters- N/V  . Fibromyalgia   . Gastroesophageal reflux disease with hiatal hernia   . Hiatal hernia   . History of kidney stones   . Hyperlipidemia   . IBS (irritable bowel syndrome)   . Lymphocytic colitis    Dr Sharlett Iles  . Migraine headache   . Mitral valve prolapse   . Neuropathy   . Osteopenia    BMD ordered by GYN  . Peripheral neuropathy    treated as RLS by  Neurology  . PONV (postoperative nausea and vomiting)   . Restless leg syndrome   . Schatzki's ring    History of  . Sleep apnea    On CPAP, has not been using  . Spondylosis     Past Surgical History:  Procedure Laterality Date  . ANAL RECTAL MANOMETRY N/A 11/14/2015   Procedure: ANO RECTAL MANOMETRY;  Surgeon: Gatha Mayer, MD;  Location: [facility];  Service: Endoscopy;  Laterality: N/A;  . BREAST ENHANCEMENT SURGERY    . CATARACT EXTRACTION Bilateral   . cataract surgery Left    Dr Bing Plume  . CHOLECYSTECTOMY    . COLONOSCOPY    . ESOPHAGEAL DILATION     X 2  . ROTATOR CUFF REPAIR Left   . SINUS ENDO W/FUSION N/A 05/30/2014   Procedure: REVISION  FRONTAL SINUS SURGERY WITH FUSION SCAN;  Surgeon: Jerrell Belfast, MD;  Location: [place];  Service: ENT;  Laterality: N/A;  . SINUS SURGERY WITH INSTATRAK     x 2  . TUBAL LIGATION    . Vaginal cystectomy     x 2  . VAGINAL HYSTERECTOMY  05/2007   Vaginal repair, Dr Ubaldo Glassing.  Partial  hysterectomy.    Social History   Socioeconomic History  . Marital status: Married    Spouse name: Not on file  . Number of children: 2  . Years of education: 83  . Highest education level: Not on file  Occupational History  . Occupation: retired    Fish farm manager: [employer]  Tobacco Use  . Smoking  status: Former Smoker    Packs/day: 0.50    Years: 15.00    Pack years: 7.50    Types: Cigarettes    Quit date: 03/03/1998    Years since quitting: 21.0  . Smokeless tobacco: Never Used  . Tobacco comment: smoked 1973- 2006, up to <  1  ppd  Substance and Sexual Activity  . Alcohol use: No    Alcohol/week: 0.0 standard drinks  .  Drug use: No  . Sexual activity: Yes    Partners: Male    Comment: 1st intercourse- 42, partners- 2, married- 62 yrs   Other Topics Concern  . Not on file  Social History Narrative   HAS REGULAR EXERCISE   DAILY CAFFEINE: 1-2 CUPS   Patient is right handed.   Social Determinants of Health   Financial Resource Strain:   . Difficulty of Paying Living Expenses: Not on file  Food Insecurity:   . Worried About Charity fundraiser in the Last Year: Not on file  . Ran Out of Food in the Last Year: Not on file  Transportation Needs:   . Lack of Transportation (Medical): Not on file  . Lack of Transportation (Non-Medical): Not on file  Physical Activity:   . Days of Exercise per Week: Not on file  . Minutes of Exercise per Session: Not on file  Stress:   . Feeling of Stress : Not on file  Social Connections:   . Frequency of Communication with Friends and Family: Not on file  . Frequency of Social Gatherings with Friends and Family: Not on file  . Attends Religious Services: Not on file  . Active Member of Clubs or Organizations: Not on file  . Attends Archivist Meetings: Not on file  . Marital Status: Not on file    Family History  Problem Relation Age of Onset  . Hypertension Father 98  . Stroke Father 3  . Heart attack Father         ? in 62s  . Hypertension Mother   . Neuropathy Mother   . Anemia Mother   . Coronary artery disease Brother        Stent placement in 60s  . Diabetes Maternal Uncle   . Heart attack Paternal Carmel [place] , ? age  . Colon cancer Neg Hx   . Seizures Neg Hx     Review of Systems    Constitutional: Negative for appetite change.  Cardiovascular: Positive for chest pain (chest pressure) and palpitations.  Psychiatric/Behavioral: Positive for dysphoric mood and sleep disturbance. The patient is nervous/anxious.        Objective:   Vitals:   03/25/19 1309  BP: 138/72  Pulse: 75  Resp: 16  Temp: 98.3 F (36.8 C)  SpO2: 98%   BP Readings from Last 3 Encounters:  03/25/19 138/72  12/23/18 (!) 97/58  12/22/18 112/70   Wt Readings from Last 3 Encounters:  03/25/19 122 lb 9.6 oz (55.6 kg)  12/23/18 132 lb 5 oz (60 kg)  12/22/18 130 lb (59 kg)   Body mass index is 18.92 kg/m.   Physical Exam Constitutional:      General: She is not in acute distress.    Appearance: Normal appearance. She is not ill-appearing.  Neurological:     Mental Status: She is alert.  Psychiatric:     Comments: Crying throughout visit, very upset            Assessment & Plan:    See Problem List for Assessment and Plan of chronic medical problems.    This visit occurred during the SARS-CoV-2 public health emergency.  Safety protocols were in place, including screening questions prior to the visit, additional usage of staff PPE, and extensive cleaning of exam room while observing appropriate contact time as indicated for disinfecting solutions.

## 2019-03-25 ENCOUNTER — Encounter: Payer: Self-pay | Admitting: Internal Medicine

## 2019-03-25 ENCOUNTER — Other Ambulatory Visit: Payer: Self-pay

## 2019-03-25 ENCOUNTER — Ambulatory Visit: Payer: 59 | Admitting: Internal Medicine

## 2019-03-25 DIAGNOSIS — F4321 Adjustment disorder with depressed mood: Secondary | ICD-10-CM | POA: Insufficient documentation

## 2019-03-25 MED ORDER — BUSPIRONE HCL 5 MG PO TABS: 5.0000 mg | ORAL_TABLET | Freq: Three times a day (TID) | ORAL | 3 refills | Status: DC

## 2019-03-25 MED ORDER — CITALOPRAM HYDROBROMIDE 10 MG PO TABS: 10.0000 mg | ORAL_TABLET | Freq: Every day | ORAL | 5 refills | Status: DC

## 2019-03-25 NOTE — Patient Instructions (Addendum)
Take clonazepam twice daily - morning and night   Take buspirone three times a day  Start celexa 10 mg daily     Follow up with me in one month   Call with any questions or concerns.

## 2019-03-25 NOTE — Assessment & Plan Note (Signed)
New problem Her grandson died unexpectedly in 02/13/19 She is always had some anxiety and depression, but with this recent event her anxiety and depression are very uncontrolled.  There are also other stressors in her family Has been taking clonazepam normal 74--year-old 3 times a day and does not feel it is very effective Has not tolerated a couple of SSRIs in the past She just started seeing the therapist, which I feel will be helpful Discussed that we need to try different medications to help her get through this and that hopefully these will be temporary Continue clonazepam 0.5 mg-take twice daily Start BuSpar 5 mg 3 times daily Start citalopram 10 mg daily Follow-up in 1 month, but advised her to call if she has any questions or concerns or has side effects from medication If we have difficulty with the medication she may need to see a psychiatrist

## 2019-03-29 ENCOUNTER — Ambulatory Visit (INDEPENDENT_AMBULATORY_CARE_PROVIDER_SITE_OTHER): Payer: 59 | Admitting: Psychiatry

## 2019-03-29 DIAGNOSIS — F4322 Adjustment disorder with anxiety: Secondary | ICD-10-CM | POA: Diagnosis not present

## 2019-04-05 ENCOUNTER — Ambulatory Visit: Payer: 59 | Admitting: Psychiatry

## 2019-04-07 ENCOUNTER — Ambulatory Visit (INDEPENDENT_AMBULATORY_CARE_PROVIDER_SITE_OTHER): Payer: 59 | Admitting: Psychiatry

## 2019-04-07 DIAGNOSIS — F4323 Adjustment disorder with mixed anxiety and depressed mood: Secondary | ICD-10-CM

## 2019-04-14 ENCOUNTER — Ambulatory Visit (INDEPENDENT_AMBULATORY_CARE_PROVIDER_SITE_OTHER): Payer: 59 | Admitting: Psychiatry

## 2019-04-14 DIAGNOSIS — F4323 Adjustment disorder with mixed anxiety and depressed mood: Secondary | ICD-10-CM

## 2019-04-19 ENCOUNTER — Ambulatory Visit: Payer: 59 | Admitting: Internal Medicine

## 2019-04-25 ENCOUNTER — Ambulatory Visit: Payer: 59 | Admitting: Family Medicine

## 2019-04-25 NOTE — Progress Notes (Signed)
Subjective:    Patient ID: [name], female    DOB: December 28, 1945, 74 y.o.   MRN: [id number]  HPI The patient is here for follow up.  She was here one month ago for depression and anxiety secondary to grieving for her grandson.  She is still seeing the therapist, which she feels is helpful.  She is taking all of her medications as prescribed.  She denies side effects.    She quivering inside.  She still has anxiety attacks.   She does have tightness in her chest at times, palpitations and occasional shortness of breath.  She is not sleeping well.  Her appetite is still decreased.  Medications and allergies reviewed with patient and updated if appropriate.  Patient Active Problem List   Diagnosis Date Noted  . Grief 03/25/2019  . DDD (degenerative disc disease), cervical 09/24/2018  . Pain in left knee 03/17/2018  . Conductive hearing loss, bilateral 03/10/2018  . Prediabetes 12/17/2017  . Neck pain 04/28/2017  . Dizziness 02/26/2017  . B12 deficiency 12/16/2016  . Chest discomfort 05/07/2016  . Back pain 05/07/2016  . Asthma 04/22/2016  . Family history of diabetes mellitus (DM) 12/07/2015  . Constipation due to outlet dysfunction   . Obstructive sleep apnea 07/12/2015  . Seasonal and perennial allergic rhinitis 07/12/2015  . Vitamin D deficiency 12/05/2014  . Rhinitis, chronic 05/30/2014  . Vaginal atrophy 10/20/2012  . Insomnia 10/20/2012  . Orthostatic hypotension 02/16/2012  . Cough 02/13/2011  . Palpitations 12/31/2010  . GERD (gastroesophageal reflux disease) 07/25/2010  . Lymphocytic colitis 06/14/2010  . Depression   . BACK PAIN, CHRONIC 12/27/2009  . Osteoporosis 12/27/2009  . SOB (shortness of breath) 06/25/2009  . Hereditary and idiopathic peripheral neuropathy 12/22/2007  . Hyperlipidemia 03/09/2007  . Generalized anxiety disorder 03/09/2007  . MIGRAINE HEADACHE 03/09/2007  . IBS 03/09/2007  . SPONDYLOSIS 03/09/2007  . Myalgia and  myositis 03/09/2007  . SCHATZKI'S RING, HX OF 03/09/2007  . Diaphragmatic hernia 12/30/2006  . GASTRITIS 02/17/2005  . DIVERTICULOSIS OF COLON 01/01/2000    Current Outpatient Medications on File Prior to Visit  Medication Sig Dispense Refill  . Azelastine-Fluticasone (DYMISTA) 137-50 MCG/ACT SUSP Place 2 sprays into the nose daily.     [name] b complex vitamins capsule Take 1 capsule by mouth daily.    . Biotin 1000 MCG tablet Take 1,000 mcg by mouth daily.      . Cholecalciferol (VITAMIN D-3) 125 MCG (5000 UT) TABS Take 1 tablet daily. 90 tablet 1  . clonazePAM (KLONOPIN) 0.5 MG tablet TAKE 1 TABLET BY MOUTH 2 TIMES DAILY. 60 tablet 1  . fluticasone furoate-vilanterol (BREO ELLIPTA) 200-25 MCG/INH AEPB Inhale 1 puff into the lungs daily. 1 each 8  . Lifitegrast (XIIDRA) 5 % SOLN Apply to eye.    . Magnesium Oxide 250 MG TABS Take 250 mg by mouth daily.     . pantoprazole (PROTONIX) 40 MG tablet Take 1 tablet (40 mg total) by mouth daily as needed. 30 tablet 1  . Probiotic Product (ALIGN) 4 MG CAPS Take 1 capsule by mouth daily.     [name] tretinoin (RETIN-A) 0.025 % cream APPLY 1 APPLICATION TO AFFECTED AREA AT BEDTIME  99  . vitamin C (ASCORBIC ACID) 500 MG tablet Take 500 mg by mouth daily.    . [DISCONTINUED] colesevelam (WELCHOL) 625 MG tablet Take 625 mg by mouth 2 (two) times daily with a meal. Take one tablet by mouth twice a day    . [  DISCONTINUED] Hyoscyamine-Phenyltoloxamine ([place] NF) [id number] MG CAPS Take one tablet by mouth as needed for abdominal cramping 24 each 0   No current facility-administered medications on file prior to visit.    Past Medical History:  Diagnosis Date  . Anxiety   . Complication of anesthesia   . Depression    generalized anxiety disorder  . Diverticulosis   . Duodenal diverticulum   . Family history of adverse reaction to anesthesia    Mother and Daughters- N/V  . Fibromyalgia   . Gastroesophageal reflux disease with hiatal hernia   . Hiatal  hernia   . History of kidney stones   . Hyperlipidemia   . IBS (irritable bowel syndrome)   . Lymphocytic colitis    Dr Sharlett Iles  . Migraine headache   . Mitral valve prolapse   . Neuropathy   . Osteopenia    BMD ordered by GYN  . Peripheral neuropathy    treated as RLS by  Neurology  . PONV (postoperative nausea and vomiting)   . Restless leg syndrome   . Schatzki's ring    History of  . Sleep apnea    On CPAP, has not been using  . Spondylosis     Past Surgical History:  Procedure Laterality Date  . ANAL RECTAL MANOMETRY N/A 11/14/2015   Procedure: ANO RECTAL MANOMETRY;  Surgeon: Gatha Mayer, MD;  Location: [facility];  Service: Endoscopy;  Laterality: N/A;  . BREAST ENHANCEMENT SURGERY    . CATARACT EXTRACTION Bilateral   . cataract surgery Left    Dr Bing Plume  . CHOLECYSTECTOMY    . COLONOSCOPY    . ESOPHAGEAL DILATION     X 2  . ROTATOR CUFF REPAIR Left   . SINUS ENDO W/FUSION N/A 05/30/2014   Procedure: REVISION  FRONTAL SINUS SURGERY WITH FUSION SCAN;  Surgeon: Jerrell Belfast, MD;  Location: [place];  Service: ENT;  Laterality: N/A;  . SINUS SURGERY WITH INSTATRAK     x 2  . TUBAL LIGATION    . Vaginal cystectomy     x 2  . VAGINAL HYSTERECTOMY  05/2007   Vaginal repair, Dr Ubaldo Glassing.  Partial  hysterectomy.    Social History   Socioeconomic History  . Marital status: Married    Spouse name: Not on file  . Number of children: 2  . Years of education: 22  . Highest education level: Not on file  Occupational History  . Occupation: retired    Fish farm manager: [employer]  Tobacco Use  . Smoking status: Former Smoker    Packs/day: 0.50    Years: 15.00    Pack years: 7.50    Types: Cigarettes    Quit date: 03/03/1998    Years since quitting: 21.1  . Smokeless tobacco: Never Used  . Tobacco comment: smoked 1973- 2006, up to <  1  ppd  Substance and Sexual Activity  . Alcohol use: No    Alcohol/week: 0.0 standard drinks  . Drug use: No  . Sexual activity: Yes     Partners: Male    Comment: 1st intercourse- 65, partners- 2, married- 25 yrs   Other Topics Concern  . Not on file  Social History Narrative   HAS REGULAR EXERCISE   DAILY CAFFEINE: 1-2 CUPS   Patient is right handed.   Social Determinants of Health   Financial Resource Strain:   . Difficulty of Paying Living Expenses: Not on file  Food Insecurity:   . Worried About Running  Out of Food in the Last Year: Not on file  . Ran Out of Food in the Last Year: Not on file  Transportation Needs:   . Lack of Transportation (Medical): Not on file  . Lack of Transportation (Non-Medical): Not on file  Physical Activity:   . Days of Exercise per Week: Not on file  . Minutes of Exercise per Session: Not on file  Stress:   . Feeling of Stress : Not on file  Social Connections:   . Frequency of Communication with Friends and Family: Not on file  . Frequency of Social Gatherings with Friends and Family: Not on file  . Attends Religious Services: Not on file  . Active Member of Clubs or Organizations: Not on file  . Attends Archivist Meetings: Not on file  . Marital Status: Not on file    Family History  Problem Relation Age of Onset  . Hypertension Father 51  . Stroke Father 25  . Heart attack Father         ? in 34s  . Hypertension Mother   . Neuropathy Mother   . Anemia Mother   . Coronary artery disease Brother        Stent placement in 60s  . Diabetes Maternal Uncle   . Heart attack Paternal Carmel [place] , ? age  . Colon cancer Neg Hx   . Seizures Neg Hx     Review of Systems  Constitutional: Positive for appetite change (decreased).  Respiratory: Positive for shortness of breath (occ).   Cardiovascular: Positive for chest pain and palpitations.  Psychiatric/Behavioral: Positive for dysphoric mood and sleep disturbance. The patient is nervous/anxious.        Objective:   Vitals:   04/26/19 1112  BP: 110/70  Pulse: 69  Resp: 16  Temp: 98.4 F (36.9  C)  SpO2: 99%   BP Readings from Last 3 Encounters:  04/26/19 110/70  03/25/19 138/72  12/23/18 (!) 97/58   Wt Readings from Last 3 Encounters:  04/26/19 121 lb (54.9 kg)  03/25/19 122 lb 9.6 oz (55.6 kg)  12/23/18 132 lb 5 oz (60 kg)   Body mass index is 18.67 kg/m.   Physical Exam Constitutional:      General: She is not in acute distress.    Appearance: Normal appearance. She is not ill-appearing.  Neurological:     Mental Status: She is alert.  Psychiatric:        Behavior: Behavior normal.        Thought Content: Thought content normal.        Judgment: Judgment normal.     Comments: Depressed affect.               Assessment & Plan:    See Problem List for Assessment and Plan of chronic medical problems.    This visit occurred during the SARS-CoV-2 public health emergency.  Safety protocols were in place, including screening questions prior to the visit, additional usage of staff PPE, and extensive cleaning of exam room while observing appropriate contact time as indicated for disinfecting solutions.

## 2019-04-26 ENCOUNTER — Ambulatory Visit: Payer: 59 | Admitting: Internal Medicine

## 2019-04-26 ENCOUNTER — Encounter: Payer: Self-pay | Admitting: Internal Medicine

## 2019-04-26 ENCOUNTER — Other Ambulatory Visit: Payer: Self-pay

## 2019-04-26 VITALS — BP 110/70 | HR 69 | Temp 98.4°F | Resp 16 | Ht 67.5 in | Wt 121.0 lb

## 2019-04-26 DIAGNOSIS — F411 Generalized anxiety disorder: Secondary | ICD-10-CM

## 2019-04-26 DIAGNOSIS — F3289 Other specified depressive episodes: Secondary | ICD-10-CM

## 2019-04-26 DIAGNOSIS — F4321 Adjustment disorder with depressed mood: Secondary | ICD-10-CM

## 2019-04-26 MED ORDER — BUSPIRONE HCL 7.5 MG PO TABS: 7.5000 mg | ORAL_TABLET | Freq: Three times a day (TID) | ORAL | 3 refills | Status: DC

## 2019-04-26 MED ORDER — BUSPIRONE HCL 5 MG PO TABS: 5.0000 mg | ORAL_TABLET | Freq: Three times a day (TID) | ORAL | 3 refills | Status: DC

## 2019-04-26 MED ORDER — CITALOPRAM HYDROBROMIDE 20 MG PO TABS: 20.0000 mg | ORAL_TABLET | Freq: Every day | ORAL | 3 refills | Status: DC

## 2019-04-26 NOTE — Assessment & Plan Note (Signed)
Acute on chronic stress Does have generalized anxiety, but stress is much worse since the unexpected death of her grandson Still experiencing difficulty sleeping, decreased appetite, chest tightness and palpitations with anxiety attacks We will continue clonazepam 0.5 mg twice daily Will increase BuSpar to 7.5 mg 3 times a day Increase citalopram to 20 mg daily Follow-up in 3-4 weeks to see how she is doing, sooner if needed She will continue to see the therapist

## 2019-04-26 NOTE — Patient Instructions (Addendum)
Medications reviewed and updated.  Changes include :   Increase citalopram to 20 mg daily. Increase buspirone to 7.5 mg three times a day.   Your prescription(s) have been submitted to your pharmacy. Please take as directed and contact our office if you believe you are having problem(s) with the medication(s).     Please followup in 3-4 weeks

## 2019-04-26 NOTE — Assessment & Plan Note (Signed)
Still grieving her grandson's unexpected death in Feb 20, 2019 Experiencing significant anxiety and depression She is tolerating her current medication without side effects We will increase buspirone to 7.5 mg 3 times daily and increase citalopram to 20 mg daily Continue clonazepam 0.5 mg twice daily She is seeing a therapist, which is helping We will follow-up in about 1 month, sooner if needed

## 2019-04-26 NOTE — Assessment & Plan Note (Signed)
Uncontrolled depression Related to the unexpected death of her grandson Doing okay with her current medications-has not had any side effects Increase citalopram to 20 mg daily Follow-up in 3-4 weeks, but will call sooner with any questions or concerns

## 2019-04-27 ENCOUNTER — Telehealth: Payer: Self-pay | Admitting: Internal Medicine

## 2019-04-27 MED ORDER — CITALOPRAM HYDROBROMIDE 20 MG PO TABS: 20.0000 mg | ORAL_TABLET | Freq: Every day | ORAL | 1 refills | Status: DC

## 2019-04-27 NOTE — Telephone Encounter (Signed)
        1. Which medications need to be refilled? (please list name of each medication and dose if known)  citalopram (CELEXA) 20 MG tablet  2. Which pharmacy/location (including street and city if local pharmacy) is medication to be sent to?[id number] - [place], [place] - [address]  3. Do they need a 30 day or 90 day supply? [place]

## 2019-04-28 ENCOUNTER — Ambulatory Visit (INDEPENDENT_AMBULATORY_CARE_PROVIDER_SITE_OTHER): Payer: 59 | Admitting: Psychiatry

## 2019-04-28 ENCOUNTER — Other Ambulatory Visit: Payer: Self-pay

## 2019-04-28 DIAGNOSIS — F4323 Adjustment disorder with mixed anxiety and depressed mood: Secondary | ICD-10-CM

## 2019-04-28 MED ORDER — CITALOPRAM HYDROBROMIDE 20 MG PO TABS: 20.0000 mg | ORAL_TABLET | Freq: Every day | ORAL | 1 refills | Status: DC

## 2019-04-30 ENCOUNTER — Emergency Department
Admission: EM | Admit: 2019-04-30 | Discharge: 2019-04-30 | Disposition: A | Discharge status: Home / Self Care | Payer: 59 | Attending: Emergency Medicine | Admitting: Emergency Medicine

## 2019-04-30 ENCOUNTER — Other Ambulatory Visit: Payer: Self-pay

## 2019-04-30 ENCOUNTER — Encounter: Payer: Self-pay | Admitting: Emergency Medicine

## 2019-04-30 DIAGNOSIS — Y9389 Activity, other specified: Secondary | ICD-10-CM | POA: Insufficient documentation

## 2019-04-30 DIAGNOSIS — Y929 Unspecified place or not applicable: Secondary | ICD-10-CM | POA: Insufficient documentation

## 2019-04-30 DIAGNOSIS — S61512A Laceration without foreign body of left wrist, initial encounter: Secondary | ICD-10-CM | POA: Diagnosis present

## 2019-04-30 DIAGNOSIS — W268XXA Contact with other sharp object(s), not elsewhere classified, initial encounter: Secondary | ICD-10-CM | POA: Insufficient documentation

## 2019-04-30 DIAGNOSIS — Y999 Unspecified external cause status: Secondary | ICD-10-CM | POA: Insufficient documentation

## 2019-04-30 MED ORDER — LIDOCAINE-EPINEPHRINE-TETRACAINE (LET) TOPICAL GEL
3.0000 mL | Freq: Once | TOPICAL | Status: AC
  Administered 2019-04-30: 3 mL via TOPICAL
  Filled 2019-04-30: qty 3

## 2019-04-30 MED ORDER — CEPHALEXIN 500 MG PO CAPS: 500.0000 mg | ORAL_CAPSULE | Freq: Three times a day (TID) | ORAL | 0 refills | Status: AC

## 2019-04-30 MED ORDER — CEPHALEXIN 500 MG PO CAPS
500.0000 mg | ORAL_CAPSULE | Freq: Once | ORAL | Status: AC
  Administered 2019-04-30: 500 mg via ORAL
  Filled 2019-04-30: qty 1

## 2019-04-30 MED ORDER — LIDOCAINE HCL (PF) 1 % IJ SOLN
5.0000 mL | Freq: Once | INTRAMUSCULAR | Status: AC
  Administered 2019-04-30: 5 mL via INTRADERMAL
  Filled 2019-04-30: qty 5

## 2019-04-30 NOTE — ED Notes (Signed)
First Nurse Note: Pt to ED via POV c/o laceration on her left wrist. Bleeding is controlled at this time.

## 2019-04-30 NOTE — ED Triage Notes (Signed)
PT to triage states she cut her left wrist on a piece of metal.  Bleeding controlled, bandage intact from home.

## 2019-04-30 NOTE — ED Provider Notes (Signed)
[facility] Emergency Department Provider Note  ____________________________________________  Time seen: Approximately 8:04 PM  I have reviewed the triage vital signs and the nursing notes.   HISTORY  Chief Complaint Laceration    HPI [name] is a 74 y.o. female that presents to the emergency department for evaluation of left wrist laceration today.  Patient cut her wrist this afternoon on a piece of metal.  Patient cleaned wound and tried to close it with a bandage.  Pain continued so she came to the emergency department.  Last tetanus was last year.   Past Medical History:  Diagnosis Date  . Anxiety   . Complication of anesthesia   . Depression    generalized anxiety disorder  . Diverticulosis   . Duodenal diverticulum   . Family history of adverse reaction to anesthesia    Mother and Daughters- N/V  . Fibromyalgia   . Gastroesophageal reflux disease with hiatal hernia   . Hiatal hernia   . History of kidney stones   . Hyperlipidemia   . IBS (irritable bowel syndrome)   . Lymphocytic colitis    Dr Sharlett Iles  . Migraine headache   . Mitral valve prolapse   . Neuropathy   . Osteopenia    BMD ordered by GYN  . Peripheral neuropathy    treated as RLS by  Neurology  . PONV (postoperative nausea and vomiting)   . Restless leg syndrome   . Schatzki's ring    History of  . Sleep apnea    On CPAP, has not been using  . Spondylosis     Patient Active Problem List   Diagnosis Date Noted  . Grief 03/25/2019  . DDD (degenerative disc disease), cervical 09/24/2018  . Pain in left knee 03/17/2018  . Conductive hearing loss, bilateral 03/10/2018  . Prediabetes 12/17/2017  . Neck pain 04/28/2017  . Dizziness 02/26/2017  . B12 deficiency 12/16/2016  . Chest discomfort 05/07/2016  . Back pain 05/07/2016  . Asthma 04/22/2016  . Family history of diabetes mellitus (DM) 12/07/2015  . Constipation due to outlet dysfunction   .  Obstructive sleep apnea 07/12/2015  . Seasonal and perennial allergic rhinitis 07/12/2015  . Vitamin D deficiency 12/05/2014  . Rhinitis, chronic 05/30/2014    Class: Chronic  . Vaginal atrophy 10/20/2012  . Insomnia 10/20/2012  . Orthostatic hypotension 02/16/2012  . Cough 02/13/2011  . Palpitations 12/31/2010  . GERD (gastroesophageal reflux disease) 07/25/2010  . Lymphocytic colitis 06/14/2010  . Depression   . BACK PAIN, CHRONIC 12/27/2009  . Osteoporosis 12/27/2009  . SOB (shortness of breath) 06/25/2009  . Hereditary and idiopathic peripheral neuropathy 12/22/2007  . Hyperlipidemia 03/09/2007  . Generalized anxiety disorder 03/09/2007  . MIGRAINE HEADACHE 03/09/2007  . IBS 03/09/2007  . SPONDYLOSIS 03/09/2007  . Myalgia and myositis 03/09/2007  . SCHATZKI'S RING, HX OF 03/09/2007  . Diaphragmatic hernia 12/30/2006  . GASTRITIS 02/17/2005  . DIVERTICULOSIS OF COLON 01/01/2000    Past Surgical History:  Procedure Laterality Date  . ANAL RECTAL MANOMETRY N/A 11/14/2015   Procedure: ANO RECTAL MANOMETRY;  Surgeon: Gatha Mayer, MD;  Location: [facility];  Service: Endoscopy;  Laterality: N/A;  . BREAST ENHANCEMENT SURGERY    . CATARACT EXTRACTION Bilateral   . cataract surgery Left    Dr Bing Plume  . CHOLECYSTECTOMY    . COLONOSCOPY    . ESOPHAGEAL DILATION     X 2  . ROTATOR CUFF REPAIR Left   . SINUS ENDO  W/FUSION N/A 05/30/2014   Procedure: REVISION  FRONTAL SINUS SURGERY WITH FUSION SCAN;  Surgeon: Jerrell Belfast, MD;  Location: [place];  Service: ENT;  Laterality: N/A;  . SINUS SURGERY WITH INSTATRAK     x 2  . TUBAL LIGATION    . Vaginal cystectomy     x 2  . VAGINAL HYSTERECTOMY  05/2007   Vaginal repair, Dr Ubaldo Glassing.  Partial  hysterectomy.    Prior to Admission medications   Medication Sig Start Date End Date Taking? Authorizing Provider  Azelastine-Fluticasone (DYMISTA) 137-50 MCG/ACT SUSP Place 2 sprays into the nose daily.     [provider]  b  complex vitamins capsule Take 1 capsule by mouth daily.    [provider]  Biotin 1000 MCG tablet Take 1,000 mcg by mouth daily.      [provider]  busPIRone (BUSPAR) 7.5 MG tablet Take 1 tablet (7.5 mg total) by mouth 3 (three) times daily. 04/26/19   [name], MD  cephALEXin (KEFLEX) 500 MG capsule Take 1 capsule (500 mg total) by mouth 3 (three) times daily for 7 days. 04/30/19 05/07/19  Laban Emperor, PA-C  Cholecalciferol (VITAMIN D-3) 125 MCG (5000 UT) TABS Take 1 tablet daily. 12/24/18   [name], MD  citalopram (CELEXA) 20 MG tablet Take 1 tablet (20 mg total) by mouth daily. 04/28/19   Burns, Claudina Lick, MD  clonazePAM (KLONOPIN) 0.5 MG tablet TAKE 1 TABLET BY MOUTH 2 TIMES DAILY. 03/22/19   Burns, Claudina Lick, MD  fluticasone furoate-vilanterol (BREO ELLIPTA) 200-25 MCG/INH AEPB Inhale 1 puff into the lungs daily. 05/25/18   [name], MD  Lifitegrast Shirley Friar) 5 % SOLN Apply to eye.    [provider]  Magnesium Oxide 250 MG TABS Take 250 mg by mouth daily.     [provider]  pantoprazole (PROTONIX) 40 MG tablet Take 1 tablet (40 mg total) by mouth daily as needed. 12/22/18   [name], MD  Probiotic Product (ALIGN) 4 MG CAPS Take 1 capsule by mouth daily.     [provider]  tretinoin (RETIN-A) 0.025 % cream APPLY 1 APPLICATION TO AFFECTED AREA AT BEDTIME 11/09/17   [provider]  vitamin C (ASCORBIC ACID) 500 MG tablet Take 500 mg by mouth daily.    [provider]  colesevelam (WELCHOL) 625 MG tablet Take 625 mg by mouth 2 (two) times daily with a meal. Take one tablet by mouth twice a day 04/16/11 05/20/11  Sable Feil, MD  Hyoscyamine-Phenyltoloxamine [facility] NF) [phone] MG CAPS Take one tablet by mouth as needed for abdominal cramping 04/15/11 05/20/11  Sable Feil, MD    Allergies Adhesive [tape], Albuterol, Cymbalta [duloxetine hcl], Lyrica [pregabalin], Neurontin [gabapentin], Nitrofuran  derivatives, Paxil [paroxetine hcl], and Prozac [fluoxetine hcl]  Family History  Problem Relation Age of Onset  . Hypertension Father 74  . Stroke Father 39  . Heart attack Father         ? in 46s  . Hypertension Mother   . Neuropathy Mother   . Anemia Mother   . Coronary artery disease Brother        Stent placement in 60s  . Diabetes Maternal Uncle   . Heart attack Paternal Carmel [place] , ? age  . Colon cancer Neg Hx   . Seizures Neg Hx     Social History Social History   Tobacco Use  .  Smoking status: Former Smoker    Packs/day: 0.50    Years: 15.00    Pack years: 7.50    Types: Cigarettes    Quit date: 03/03/1998    Years since quitting: 21.1  . Smokeless tobacco: Never Used  . Tobacco comment: smoked 1973- 2006, up to <  1  ppd  Substance Use Topics  . Alcohol use: No    Alcohol/week: 0.0 standard drinks  . Drug use: No     Review of Systems  Cardiovascular:  Respiratory: No SOB. Gastrointestinal: No nausea, no vomiting.  Musculoskeletal: Positive for wrist pain. Skin: Negative for rash, ecchymosis. Positive for laceration. Neurological: Negative for numbness or tingling   ____________________________________________   PHYSICAL EXAM:  VITAL SIGNS: ED Triage Vitals [04/30/19 1848]  Enc Vitals Group     BP 140/62     Pulse Rate 70     Resp 18     Temp 98 F (36.7 C)     Temp Source Oral     SpO2 99 %     Weight 121 lb 0.5 oz (54.9 kg)     Height 5' 7.5" (1.715 m)     Head Circumference      Peak Flow      Pain Score 7     Pain Loc      Pain Edu?      Excl. in [place]?      Constitutional: Alert and oriented. Well appearing and in no acute distress. Eyes: Conjunctivae are normal. PERRL. EOMI. Head: Atraumatic. ENT:      Ears:      Nose: No congestion/rhinnorhea.      Mouth/Throat: Mucous membranes are moist.  Neck: No stridor.   Cardiovascular: Normal rate, regular rhythm.  Good peripheral circulation. Respiratory: Normal  respiratory effort without tachypnea or retractions. Lungs CTAB. Good air entry to the bases with no decreased or absent breath sounds. Musculoskeletal: Full range of motion to all extremities. No gross deformities appreciated.  Full range of motion of hand. Neurologic:  Normal speech and language. No gross focal neurologic deficits are appreciated.  Skin:  Skin is warm, dry and intact.  1 cm curved laceration to left wrist. Psychiatric: Mood and affect are normal. Speech and behavior are normal. Patient exhibits appropriate insight and judgement.   ____________________________________________   LABS (all labs ordered are listed, but only abnormal results are displayed)  Labs Reviewed - No data to display ____________________________________________  EKG   ____________________________________________  RADIOLOGY   No results found.  ____________________________________________    PROCEDURES  Procedure(s) performed:    Procedures  LACERATION REPAIR Performed by: Laban Emperor  Consent: Verbal consent obtained.  Consent given by: patient  Prepped and Draped in normal sterile fashion  Wound explored: No foreign bodies   Laceration Location: left wrist  Laceration Length: 1 cm  Anesthesia: None  Local anesthetic: lidocaine 1% without epinephrine  Anesthetic total: 2 ml  Irrigation method: syringe  Amount of cleaning: 560ml normal saline  Skin closure: 4-0 nylon  Number of sutures: 4  Technique: Simple interrupted  Patient tolerance: Patient tolerated the procedure well with no immediate complications.  Medications  lidocaine-EPINEPHrine-tetracaine (LET) topical gel (3 mLs Topical Given 04/30/19 2059)  lidocaine (PF) (XYLOCAINE) 1 % injection 5 mL (5 mLs Intradermal Given 04/30/19 2059)  cephALEXin (KEFLEX) capsule 500 mg (500 mg Oral Given 04/30/19 2057)     ____________________________________________   INITIAL IMPRESSION / ASSESSMENT AND PLAN /  ED COURSE  Pertinent labs &  imaging results that were available during my care of the patient were reviewed by me and considered in my medical decision making (see chart for details).  Review of the [place] CSRS was performed in accordance of the [place] prior to dispensing any controlled drugs.     Patient's diagnosis is consistent with wrist laceration.  Laceration was repaired with stitches.  Patient will be discharged home with prescriptions for keflex. Patient is to follow up with PCP as directed. Patient is given ED precautions to return to the ED for any worsening or new symptoms.  [name] was evaluated in Emergency Department on 04/30/2019 for the symptoms described in the history of present illness. She was evaluated in the context of the global COVID-19 pandemic, which necessitated consideration that the patient might be at risk for infection with the SARS-CoV-2 virus that causes COVID-19. Institutional protocols and algorithms that pertain to the evaluation of patients at risk for COVID-19 are in a state of rapid change based on information released by regulatory bodies including the CDC and federal and state organizations. These policies and algorithms were followed during the patient's care in the ED.   ____________________________________________  FINAL CLINICAL IMPRESSION(S) / ED DIAGNOSES  Final diagnoses:  Laceration of left wrist, initial encounter      NEW MEDICATIONS STARTED DURING THIS VISIT:  ED Discharge Orders         Ordered    cephALEXin (KEFLEX) 500 MG capsule  3 times daily     04/30/19 2042              This chart was dictated using voice recognition software/Dragon. Despite best efforts to proofread, errors can occur which can change the meaning. Any change was purely unintentional.    Laban Emperor, PA-C 04/30/19 2238    Drenda Freeze, MD 05/01/19 [phone]

## 2019-04-30 NOTE — Discharge Instructions (Signed)
Please keep sutures dry for 24 hours.  After this, they can get wet.  Please keep wound covered.  Take antibiotics to help prevent infection.  Please follow-up with primary care next week for recheck.  Please follow-up with primary care in about 1 week for suture removal.

## 2019-04-30 NOTE — ED Notes (Signed)
Approximately 1-inch laceration noted to left wrist. Band-Aid from home removed and gauze dressing applied. Bleeding is well controlled at this time.

## 2019-05-02 ENCOUNTER — Telehealth: Payer: Self-pay | Admitting: Internal Medicine

## 2019-05-02 NOTE — Telephone Encounter (Signed)
Yes - ok to wait 2 extra days but not longer - it may be a little harder to remove the stitches

## 2019-05-02 NOTE — Telephone Encounter (Signed)
Pt is calling and would like to know if its ok for her to take a shower pt has stitches in her wrist

## 2019-05-02 NOTE — Telephone Encounter (Signed)
New message:   Pt states she went to the ER on 04/30/19 and states she had to get 4 stitches in her wrist from a cut. She states they said to get the stitches removed in 7 days and so I scheduled her for 05/09/19. The patient would like to know if it is okay to wait for 2 extra days or come in on 05/06/19.

## 2019-05-03 ENCOUNTER — Ambulatory Visit (INDEPENDENT_AMBULATORY_CARE_PROVIDER_SITE_OTHER): Payer: 59 | Admitting: Psychiatry

## 2019-05-03 DIAGNOSIS — F4323 Adjustment disorder with mixed anxiety and depressed mood: Secondary | ICD-10-CM

## 2019-05-03 NOTE — Telephone Encounter (Signed)
Pt aware of response below.  

## 2019-05-08 ENCOUNTER — Other Ambulatory Visit: Payer: Self-pay | Admitting: Internal Medicine

## 2019-05-08 NOTE — Progress Notes (Signed)
Subjective:    Patient ID: [name], female    DOB: [birth year], 74 y.o.   MRN: [id number]  HPI The patient is here for an acute visit to have stitches removed.   She went to the ED 2/27 for a left wrist laceration.  She cut her wrist on a piece of metal.  Laceration was 1 cm in length and she received 4 sutures.  Tetanus was up-to-date.  She was discharged on Keflex.  She is here for suture removal.  She completed the keflex.  She denies any pain, numbness or tingling.  There is no discharge from the wound.   Medications and allergies reviewed with patient and updated if appropriate.  Patient Active Problem List   Diagnosis Date Noted  . Grief 03/25/2019  . DDD (degenerative disc disease), cervical 09/24/2018  . Pain in left knee 03/17/2018  . Conductive hearing loss, bilateral 03/10/2018  . Prediabetes 12/17/2017  . Neck pain 04/28/2017  . Dizziness 02/26/2017  . B12 deficiency 12/16/2016  . Chest discomfort 05/07/2016  . Back pain 05/07/2016  . Asthma 04/22/2016  . Family history of diabetes mellitus (DM) 12/07/2015  . Constipation due to outlet dysfunction   . Obstructive sleep apnea 07/12/2015  . Seasonal and perennial allergic rhinitis 07/12/2015  . Vitamin D deficiency 12/05/2014  . Rhinitis, chronic 05/30/2014  . Vaginal atrophy 10/20/2012  . Insomnia 10/20/2012  . Orthostatic hypotension 02/16/2012  . Cough 02/13/2011  . Palpitations 12/31/2010  . GERD (gastroesophageal reflux disease) 07/25/2010  . Lymphocytic colitis 06/14/2010  . Depression   . BACK PAIN, CHRONIC 12/27/2009  . Osteoporosis 12/27/2009  . SOB (shortness of breath) 06/25/2009  . Hereditary and idiopathic peripheral neuropathy 12/22/2007  . Hyperlipidemia 03/09/2007  . Generalized anxiety disorder 03/09/2007  . MIGRAINE HEADACHE 03/09/2007  . IBS 03/09/2007  . SPONDYLOSIS 03/09/2007  . Myalgia and myositis 03/09/2007  . SCHATZKI'S RING, HX OF 03/09/2007  .  Diaphragmatic hernia 12/30/2006  . GASTRITIS 02/17/2005  . DIVERTICULOSIS OF COLON 01/01/2000    Current Outpatient Medications on File Prior to Visit  Medication Sig Dispense Refill  . Azelastine-Fluticasone (DYMISTA) 137-50 MCG/ACT SUSP Place 2 sprays into the nose daily.     [name] b complex vitamins capsule Take 1 capsule by mouth daily.    . Biotin 1000 MCG tablet Take 1,000 mcg by mouth daily.      . busPIRone (BUSPAR) 7.5 MG tablet Take 1 tablet (7.5 mg total) by mouth 3 (three) times daily. 90 tablet 3  . Cholecalciferol (VITAMIN D-3) 125 MCG (5000 UT) TABS Take 1 tablet daily. 90 tablet 1  . clonazePAM (KLONOPIN) 0.5 MG tablet TAKE 1 TABLET BY MOUTH 2 TIMES DAILY. 60 tablet 1  . fluticasone furoate-vilanterol (BREO ELLIPTA) 200-25 MCG/INH AEPB Inhale 1 puff into the lungs daily. 1 each 8  . Lifitegrast (XIIDRA) 5 % SOLN Apply to eye.    . Magnesium Oxide 250 MG TABS Take 250 mg by mouth daily.     . pantoprazole (PROTONIX) 40 MG tablet TAKE 1 TABLET BY MOUTH DAILY AS NEEDED 30 tablet 1  . Probiotic Product (ALIGN) 4 MG CAPS Take 1 capsule by mouth daily.     [name] tretinoin (RETIN-A) 0.025 % cream APPLY 1 APPLICATION TO AFFECTED AREA AT BEDTIME  99  . vitamin C (ASCORBIC ACID) 500 MG tablet Take 500 mg by mouth daily.    . [DISCONTINUED] colesevelam (WELCHOL) 625 MG tablet Take 625 mg by mouth 2 (two) times daily  with a meal. Take one tablet by mouth twice a day    . [DISCONTINUED] Hyoscyamine-Phenyltoloxamine ([facility] NF) [id number] MG CAPS Take one tablet by mouth as needed for abdominal cramping 24 each 0   No current facility-administered medications on file prior to visit.    Past Medical History:  Diagnosis Date  . Anxiety   . Complication of anesthesia   . Depression    generalized anxiety disorder  . Diverticulosis   . Duodenal diverticulum   . Family history of adverse reaction to anesthesia    Mother and Daughters- N/V  . Fibromyalgia   . Gastroesophageal reflux disease  with hiatal hernia   . Hiatal hernia   . History of kidney stones   . Hyperlipidemia   . IBS (irritable bowel syndrome)   . Lymphocytic colitis    Dr Sharlett Iles  . Migraine headache   . Mitral valve prolapse   . Neuropathy   . Osteopenia    BMD ordered by GYN  . Peripheral neuropathy    treated as RLS by  Neurology  . PONV (postoperative nausea and vomiting)   . Restless leg syndrome   . Schatzki's ring    History of  . Sleep apnea    On CPAP, has not been using  . Spondylosis     Past Surgical History:  Procedure Laterality Date  . ANAL RECTAL MANOMETRY N/A 11/14/2015   Procedure: ANO RECTAL MANOMETRY;  Surgeon: Gatha Mayer, MD;  Location: [facility];  Service: Endoscopy;  Laterality: N/A;  . BREAST ENHANCEMENT SURGERY    . CATARACT EXTRACTION Bilateral   . cataract surgery Left    Dr Bing Plume  . CHOLECYSTECTOMY    . COLONOSCOPY    . ESOPHAGEAL DILATION     X 2  . ROTATOR CUFF REPAIR Left   . SINUS ENDO W/FUSION N/A 05/30/2014   Procedure: REVISION  FRONTAL SINUS SURGERY WITH FUSION SCAN;  Surgeon: Jerrell Belfast, MD;  Location: [place];  Service: ENT;  Laterality: N/A;  . SINUS SURGERY WITH INSTATRAK     x 2  . TUBAL LIGATION    . Vaginal cystectomy     x 2  . VAGINAL HYSTERECTOMY  05/2007   Vaginal repair, Dr Ubaldo Glassing.  Partial  hysterectomy.    Social History   Socioeconomic History  . Marital status: Married    Spouse name: Not on file  . Number of children: 2  . Years of education: 55  . Highest education level: Not on file  Occupational History  . Occupation: retired    Fish farm manager: [employer]  Tobacco Use  . Smoking status: Former Smoker    Packs/day: 0.50    Years: 15.00    Pack years: 7.50    Types: Cigarettes    Quit date: 03/03/1998    Years since quitting: 21.1  . Smokeless tobacco: Never Used  . Tobacco comment: smoked 1973- 2006, up to <  1  ppd  Substance and Sexual Activity  . Alcohol use: No    Alcohol/week: 0.0 standard drinks  . Drug use:  No  . Sexual activity: Yes    Partners: Male    Comment: 1st intercourse- 38, partners- 2, married- 29 yrs   Other Topics Concern  . Not on file  Social History Narrative   HAS REGULAR EXERCISE   DAILY CAFFEINE: 1-2 CUPS   Patient is right handed.   Social Determinants of Health   Financial Resource Strain:   . Difficulty of  Paying Living Expenses: Not on file  Food Insecurity:   . Worried About Charity fundraiser in the Last Year: Not on file  . Ran Out of Food in the Last Year: Not on file  Transportation Needs:   . Lack of Transportation (Medical): Not on file  . Lack of Transportation (Non-Medical): Not on file  Physical Activity:   . Days of Exercise per Week: Not on file  . Minutes of Exercise per Session: Not on file  Stress:   . Feeling of Stress : Not on file  Social Connections:   . Frequency of Communication with Friends and Family: Not on file  . Frequency of Social Gatherings with Friends and Family: Not on file  . Attends Religious Services: Not on file  . Active Member of Clubs or Organizations: Not on file  . Attends Archivist Meetings: Not on file  . Marital Status: Not on file    Family History  Problem Relation Age of Onset  . Hypertension Father 87  . Stroke Father 50  . Heart attack Father         ? in 55s  . Hypertension Mother   . Neuropathy Mother   . Anemia Mother   . Coronary artery disease Brother        Stent placement in 60s  . Diabetes Maternal Uncle   . Heart attack Paternal Carmel [place] , ? age  . Colon cancer Neg Hx   . Seizures Neg Hx     Review of Systems  Constitutional: Negative for chills and fever.  Skin: Positive for wound. Negative for color change.  Neurological: Negative for weakness and numbness.       Objective:   Vitals:   05/09/19 1025  BP: 140/80  Pulse: 66  Resp: 16  Temp: 98.2 F (36.8 C)  SpO2: 95%   BP Readings from Last 3 Encounters:  05/09/19 140/80  04/30/19 140/62    04/26/19 110/70   Wt Readings from Last 3 Encounters:  04/30/19 121 lb 0.5 oz (54.9 kg)  04/26/19 121 lb (54.9 kg)  03/25/19 122 lb 9.6 oz (55.6 kg)   Body mass index is 18.68 kg/m.   Physical Exam Constitutional:      General: She is not in acute distress.    Appearance: Normal appearance. She is not ill-appearing.  HENT:     Head: Normocephalic and atraumatic.  Skin:    General: Skin is warm and dry.     Comments: Left wrist-lateral aspect distal forearm approximately 1 inch healed laceration-no open wound, no discharge, no swelling, erythema and sensation is normal  Neurological:     Mental Status: She is alert.     Sensory: No sensory deficit.     Motor: No weakness.            Assessment & Plan:    See Problem List for Assessment and Plan of chronic medical problems.     This visit occurred during the SARS-CoV-2 public health emergency.  Safety protocols were in place, including screening questions prior to the visit, additional usage of staff PPE, and extensive cleaning of exam room while observing appropriate contact time as indicated for disinfecting solutions.

## 2019-05-09 ENCOUNTER — Other Ambulatory Visit: Payer: Self-pay

## 2019-05-09 ENCOUNTER — Ambulatory Visit: Payer: 59 | Admitting: Internal Medicine

## 2019-05-09 ENCOUNTER — Encounter: Payer: Self-pay | Admitting: Internal Medicine

## 2019-05-09 DIAGNOSIS — S61512A Laceration without foreign body of left wrist, initial encounter: Secondary | ICD-10-CM | POA: Diagnosis not present

## 2019-05-09 MED ORDER — CITALOPRAM HYDROBROMIDE 20 MG PO TABS: 20.0000 mg | ORAL_TABLET | Freq: Every day | ORAL | 1 refills | Status: DC

## 2019-05-09 NOTE — Assessment & Plan Note (Signed)
Acute Sutures removed Laceration looks good-healed, closed without evidence of infection Completed Keflex Does not need to keep it covered Can apply Vaseline or anything topically to help reduce scar Call with questions or concerns

## 2019-05-09 NOTE — Patient Instructions (Signed)
Your wound looks good.  You can apply anything topically to help with the healing.   You do not need to keep the area covered.

## 2019-05-10 ENCOUNTER — Ambulatory Visit (INDEPENDENT_AMBULATORY_CARE_PROVIDER_SITE_OTHER): Payer: 59 | Admitting: Psychiatry

## 2019-05-10 DIAGNOSIS — F4323 Adjustment disorder with mixed anxiety and depressed mood: Secondary | ICD-10-CM

## 2019-05-16 NOTE — Progress Notes (Signed)
Cardiology Office Note  Date:  05/18/2019   ID:  [name], DOB 02/12/1946, MRN [id number]  PCP:  [name], MD   Chief Complaint  Patient presents with  . OTHER    Pt lost 76 yr old grandson in November. Palpitations/chest crushing feeling/ortho. hypotension and sob. Meds reviewed verbally with pt.    HPI:  [name] is a pleasant 74 yo with history of  hyperlipidemia, fibromyalgia,  despression extensive GI disease including hiatal hernia, GERD, gastritis, Schatzkes ring s/p dilatations  CT coronary calcium score of 0 September 2016 presents for follow-up of her chronic chest heaviness, hypotension, dizziness  Lost grandson, 01/2019, motor vehicle accident Tearful today.  She and her husband and daughter are having a difficult time, meeting with counselors Heart "broken in 2" Brings pictures in the office today of him  Having different symptoms since November Quivering all over body, heart palpitations insomnia In the past not wearing her CPAP  One episode of chest pain, "crushing" Rare shooting pains in heart Has been concerned, wants to make sure that everything is okay  Orthostatics reviewed: Laying 109/74 rate 64 Sitting 117/76, rate 63  Standing  108/77, rate 73 bpm Standing 3 min 122/76, rate 71   EKG personally reviewed by myself on todays visit Shows normal sinus rhythm with rate 67 bpm no significant ST or T wave changes  Previous event monitor reviewed   very short runs of narrow complex tachycardia Difficult to treat secondary to chronically low blood pressure Possibly driven by her anxiety  Other past medical history reviewed Stress test 07/14/2017 Normal wall motion, good exercise tolerance Target heart rate achieved on stress test No indication of blockage based on findings  Chronic neuropathy in her lower extremities Previous CT coronary calcium score discussed with her, score of 0 September 2016   PMH:    has a past medical history of Anxiety, Complication of anesthesia, Depression, Diverticulosis, Duodenal diverticulum, Family history of adverse reaction to anesthesia, Fibromyalgia, Gastroesophageal reflux disease with hiatal hernia, Hiatal hernia, History of kidney stones, Hyperlipidemia, IBS (irritable bowel syndrome), Lymphocytic colitis, Migraine headache, Mitral valve prolapse, Neuropathy, Osteopenia, Peripheral neuropathy, PONV (postoperative nausea and vomiting), Restless leg syndrome, Schatzki's ring, Sleep apnea, and Spondylosis.  PSH:    Past Surgical History:  Procedure Laterality Date  . ANAL RECTAL MANOMETRY N/A 11/14/2015   Procedure: ANO RECTAL MANOMETRY;  Surgeon: Gatha Mayer, MD;  Location: [facility];  Service: Endoscopy;  Laterality: N/A;  . BREAST ENHANCEMENT SURGERY    . CATARACT EXTRACTION Bilateral   . cataract surgery Left    Dr Bing Plume  . CHOLECYSTECTOMY    . COLONOSCOPY    . ESOPHAGEAL DILATION     X 2  . ROTATOR CUFF REPAIR Left   . SINUS ENDO W/FUSION N/A 05/30/2014   Procedure: REVISION  FRONTAL SINUS SURGERY WITH FUSION SCAN;  Surgeon: Jerrell Belfast, MD;  Location: [facility];  Service: ENT;  Laterality: N/A;  . SINUS SURGERY WITH INSTATRAK     x 2  . TUBAL LIGATION    . Vaginal cystectomy     x 2  . VAGINAL HYSTERECTOMY  05/2007   Vaginal repair, Dr Ubaldo Glassing.  Partial  hysterectomy.    Current Outpatient Medications  Medication Sig Dispense Refill  . Azelastine-Fluticasone (DYMISTA) 137-50 MCG/ACT SUSP Place 2 sprays into the nose daily.     [name] b complex vitamins capsule Take 1 capsule by mouth daily.    . busPIRone (BUSPAR) 7.5 MG  tablet Take 1 tablet (7.5 mg total) by mouth 3 (three) times daily. 90 tablet 3  . Cholecalciferol (VITAMIN D-3) 125 MCG (5000 UT) TABS Take 1 tablet daily. 90 tablet 1  . citalopram (CELEXA) 20 MG tablet Take 1 tablet (20 mg total) by mouth daily. 30 tablet 1  . clonazePAM (KLONOPIN) 0.5 MG tablet TAKE 1 TABLET BY MOUTH 2 TIMES  DAILY. 60 tablet 1  . fluticasone furoate-vilanterol (BREO ELLIPTA) 200-25 MCG/INH AEPB Inhale 1 puff into the lungs daily. 1 each 8  . Magnesium Oxide 250 MG TABS Take 250 mg by mouth daily.     . pantoprazole (PROTONIX) 40 MG tablet TAKE 1 TABLET BY MOUTH DAILY AS NEEDED 30 tablet 1  . Probiotic Product (ALIGN) 4 MG CAPS Take 1 capsule by mouth as needed.     . tretinoin (RETIN-A) 0.025 % cream APPLY 1 APPLICATION TO AFFECTED AREA AT BEDTIME  99  . vitamin C (ASCORBIC ACID) 500 MG tablet Take 500 mg by mouth daily.    . Biotin 1000 MCG tablet Take 1,000 mcg by mouth daily.       No current facility-administered medications for this visit.     Allergies:   Adhesive [tape], Albuterol, Cymbalta [duloxetine hcl], Lyrica [pregabalin], Neurontin [gabapentin], Nitrofuran derivatives, Paxil [paroxetine hcl], and Prozac [fluoxetine hcl]   Social History:  The patient  reports that she quit smoking about 21 years ago. Her smoking use included cigarettes. She has a 7.50 pack-year smoking history. She has never used smokeless tobacco. She reports that she does not drink alcohol or use drugs.   Family History:   family history includes Anemia in her mother; Coronary artery disease in her brother; Diabetes in her maternal uncle; Heart attack in her father and paternal uncle; Hypertension in her mother; Hypertension (age of onset: 74) in her father; Neuropathy in her mother; Stroke (age of onset: 34) in her father.    Review of Systems: Review of Systems  Constitutional: Negative.   HENT: Negative.   Respiratory: Negative.   Cardiovascular: Positive for chest pain.  Gastrointestinal: Negative.   Musculoskeletal: Negative.   Neurological: Negative.   Psychiatric/Behavioral: Positive for depression. The patient is nervous/anxious.   All other systems reviewed and are negative.   PHYSICAL EXAM: VS:  BP 120/80 (BP Location: Left Arm, Patient Position: Sitting, Cuff Size: Normal)   Pulse 64   Ht 5'  7.05" (1.703 m)   Wt 120 lb 2 oz (54.5 kg)   SpO2 98%   BMI 18.79 kg/m  , BMI Body mass index is 18.79 kg/m. Constitutional:  oriented to person, place, and time. No distress.  HENT:  Head: Grossly normal Eyes:  no discharge. No scleral icterus.  Neck: No JVD, no carotid bruits  Cardiovascular: Regular rate and rhythm, no murmurs appreciated Pulmonary/Chest: Clear to auscultation bilaterally, no wheezes or rails Abdominal: Soft.  no distension.  no tenderness.  Musculoskeletal: Normal range of motion Neurological:  normal muscle tone. Coordination normal. No atrophy Skin: Skin warm and dry Psychiatric: normal affect, pleasant   Recent Labs: 12/22/2018: ALT 12; BUN 11; Creatinine, Ser 0.67; Hemoglobin 13.4; Platelets 252.0; Potassium 4.1; Sodium 141; TSH 2.94    Lipid Panel Lab Results  Component Value Date   CHOL 215 (H) 12/22/2018   HDL 66.00 12/22/2018   LDLCALC 128 (H) 12/22/2018   TRIG 106.0 12/22/2018      Wt Readings from Last 3 Encounters:  05/18/19 120 lb 2 oz (54.5 kg)  04/30/19 121  lb 0.5 oz (54.9 kg)  04/26/19 121 lb (54.9 kg)      ASSESSMENT AND PLAN:  Depression/anxiety Loss of grandson very stressful Having some chest pain symptoms, palpitations/tachycardia Suspect recent events are driving her symptoms Concerned about the chest pain  Chest pain, atypical,  Low likelihood given calcium score of 0 and very few risk factors Pain likely driven by recent events, loss of family member  lexiscan myoview ordered given her concern  Hyperlipidemia, unspecified hyperlipidemia type -  Calcium score of 0 Losing weight, recommend she maintain her diet  Palpitations -  monitor with short runs of SVT, atrial tachycardia exacerbated by recent events  Orthostatic hypotension Recommended hydration,  Recs check today, no significant drop in pressures  Disposition:   F/U as needed    Total encounter time more than 25 minutes  Greater than 50% was spent  in counseling and coordination of care with the patient    Orders Placed This Encounter  Procedures  . EKG 12-Lead     Signed, Esmond Plants, M.D., Ph.D. 05/18/2019  [place], [place]

## 2019-05-18 ENCOUNTER — Encounter: Payer: Self-pay | Admitting: Cardiovascular Disease

## 2019-05-18 ENCOUNTER — Ambulatory Visit: Payer: 59 | Admitting: Cardiovascular Disease

## 2019-05-18 ENCOUNTER — Other Ambulatory Visit: Payer: Self-pay

## 2019-05-18 VITALS — BP 120/80 | HR 64 | Ht 67.05 in | Wt 120.1 lb

## 2019-05-18 DIAGNOSIS — R079 Chest pain, unspecified: Secondary | ICD-10-CM

## 2019-05-18 DIAGNOSIS — R002 Palpitations: Secondary | ICD-10-CM

## 2019-05-18 DIAGNOSIS — I951 Orthostatic hypotension: Secondary | ICD-10-CM | POA: Diagnosis not present

## 2019-05-18 DIAGNOSIS — R0602 Shortness of breath: Secondary | ICD-10-CM

## 2019-05-18 DIAGNOSIS — E782 Mixed hyperlipidemia: Secondary | ICD-10-CM

## 2019-05-18 DIAGNOSIS — F418 Other specified anxiety disorders: Secondary | ICD-10-CM

## 2019-05-18 NOTE — Patient Instructions (Addendum)
In the past you had some short episodes of paroxysmal tachycardia.   Medication Instructions:  No changes  If you need a refill on your cardiac medications before your next appointment, please call your pharmacy.    Lab work: No new labs needed   If you have labs (blood work) drawn today and your tests are completely normal, you will receive your results only by: [name] MyChart Message (if you have MyChart) OR . A paper copy in the mail If you have any lab test that is abnormal or we need to change your treatment, we will call you to review the results.   Testing/Procedures: We will order  lexiscan stress test for chest pain and shotrness of breath  [place]  Your caregiver has ordered a Stress Test with nuclear imaging. The purpose of this test is to evaluate the blood supply to your heart muscle. This procedure is referred to as a "Non-Invasive Stress Test." This is because other than having an IV started in your vein, nothing is inserted or "invades" your body. Cardiac stress tests are done to find areas of poor blood flow to the heart by determining the extent of coronary artery disease (CAD). Some patients exercise on a treadmill, which naturally increases the blood flow to your heart, while others who are  unable to walk on a treadmill due to physical limitations have a pharmacologic/chemical stress agent called Lexiscan . This medicine will mimic walking on a treadmill by temporarily increasing your coronary blood flow.   Please note: these test may take anywhere between 2-4 hours to complete  PLEASE REPORT TO [facility] AT THE FIRST DESK WILL DIRECT YOU WHERE TO GO  Date of Procedure:_____________________________________  Arrival Time for Procedure:______________________________   PLEASE NOTIFY THE OFFICE AT LEAST 24 HOURS IN ADVANCE IF YOU ARE UNABLE TO KEEP YOUR APPOINTMENT.  [phone] AND  PLEASE NOTIFY NUCLEAR MEDICINE AT [facility] AT LEAST  24 HOURS IN ADVANCE IF YOU ARE UNABLE TO KEEP YOUR APPOINTMENT. [phone]  How to prepare for your Myoview test:  1. Do not eat or drink after midnight 2. No caffeine for 24 hours prior to test 3. No smoking 24 hours prior to test. 4. Your medication may be taken with water.  If your doctor stopped a medication because of this test, do not take that medication. 5. Ladies, please do not wear dresses.  Skirts or pants are appropriate. Please wear a short sleeve shirt. 6. No perfume, cologne or lotion. 7. Wear comfortable walking shoes. No heels!   Follow-Up: At [facility], you and your health needs are our priority.  As part of our continuing mission to provide you with exceptional heart care, we have created designated Provider Care Teams.  These Care Teams include your primary Cardiologist (physician) and Advanced Practice Providers (APPs -  Physician Assistants and Nurse Practitioners) who all work together to provide you with the care you need, when you need it.  . You will need a follow up appointment as needed .   [name] Providers on your designated Care Team:   . Murray Hodgkins, NP . Christell Faith, PA-C . Marrianne Mood, PA-C  Any Other Special Instructions Will Be Listed Below (If Applicable).  For educational health videos Log in to : [email or web address] Or : [email or web address], password : [email or web address]

## 2019-05-24 ENCOUNTER — Ambulatory Visit: Payer: 59 | Admitting: Internal Medicine

## 2019-05-24 ENCOUNTER — Ambulatory Visit: Payer: 59 | Admitting: Cardiovascular Disease

## 2019-05-26 ENCOUNTER — Ambulatory Visit (INDEPENDENT_AMBULATORY_CARE_PROVIDER_SITE_OTHER): Payer: 59 | Admitting: Psychiatry

## 2019-05-26 DIAGNOSIS — F4323 Adjustment disorder with mixed anxiety and depressed mood: Secondary | ICD-10-CM | POA: Diagnosis not present

## 2019-05-31 ENCOUNTER — Ambulatory Visit (INDEPENDENT_AMBULATORY_CARE_PROVIDER_SITE_OTHER): Payer: 59 | Admitting: Psychiatry

## 2019-05-31 ENCOUNTER — Other Ambulatory Visit: Payer: Self-pay | Admitting: Internal Medicine

## 2019-05-31 DIAGNOSIS — F4323 Adjustment disorder with mixed anxiety and depressed mood: Secondary | ICD-10-CM

## 2019-05-31 NOTE — Telephone Encounter (Signed)
Last RF 04/12/19

## 2019-05-31 NOTE — Telephone Encounter (Signed)
Patient going out of town leaving in the morning asking can the medication be refilled today.   1.Medication Requested:clonazePAM (KLONOPIN) 0.5 MG tablet  2. Pharmacy ([place]):[id number] - [place], [place]  3. On Med List: Yes   4. Last Visit with PCP: 3.8.2021   5. Next visit date with PCP: 4.21.21    Agent: Please be advised that RX refills may take up to 3 business days. We ask that you follow-up with your pharmacy.

## 2019-06-08 ENCOUNTER — Other Ambulatory Visit: Payer: Self-pay

## 2019-06-08 ENCOUNTER — Encounter
Admission: RE | Admit: 2019-06-08 | Discharge: 2019-06-08 | Disposition: A | Discharge status: Home / Self Care | Payer: 59 | Source: Ambulatory Visit | Attending: Cardiovascular Disease | Admitting: Cardiovascular Disease

## 2019-06-08 DIAGNOSIS — R079 Chest pain, unspecified: Secondary | ICD-10-CM

## 2019-06-08 LAB — NM MYOCAR MULTI W/SPECT W/WALL MOTION / EF
Estimated workload: 1 METS
Exercise duration (min): 0 min
Exercise duration (sec): 0 s
LV dias vol: 47 mL (ref 46–106)
LV sys vol: 14 mL
MPHR: 145 {beats}/min
Peak HR: 86 {beats}/min
Percent HR: 59 %
Rest HR: 56 {beats}/min
SDS: 11
SRS: 3
SSS: 9
TID: 0.86

## 2019-06-08 MED ORDER — TECHNETIUM TC 99M TETROFOSMIN IV KIT
10.4210 | PACK | Freq: Once | INTRAVENOUS | Status: AC | PRN
  Administered 2019-06-08: 10.421 via INTRAVENOUS

## 2019-06-08 MED ORDER — REGADENOSON 0.4 MG/5ML IV SOLN
0.4000 mg | Freq: Once | INTRAVENOUS | Status: AC
  Administered 2019-06-08: 0.4 mg via INTRAVENOUS

## 2019-06-08 MED ORDER — TECHNETIUM TC 99M TETROFOSMIN IV KIT
29.7710 | PACK | Freq: Once | INTRAVENOUS | Status: AC | PRN
  Administered 2019-06-08: 29.771 via INTRAVENOUS

## 2019-06-09 ENCOUNTER — Ambulatory Visit: Payer: 59 | Admitting: Internal Medicine

## 2019-06-21 ENCOUNTER — Ambulatory Visit: Payer: 59 | Admitting: Psychologist

## 2019-06-22 ENCOUNTER — Other Ambulatory Visit: Payer: Self-pay

## 2019-06-22 ENCOUNTER — Ambulatory Visit: Payer: 59 | Admitting: Internal Medicine

## 2019-06-22 ENCOUNTER — Encounter: Payer: Self-pay | Admitting: Internal Medicine

## 2019-06-22 VITALS — BP 112/64 | HR 61 | Temp 98.3°F | Resp 16 | Ht 67.5 in | Wt 121.0 lb

## 2019-06-22 DIAGNOSIS — F3289 Other specified depressive episodes: Secondary | ICD-10-CM

## 2019-06-22 DIAGNOSIS — F411 Generalized anxiety disorder: Secondary | ICD-10-CM | POA: Diagnosis not present

## 2019-06-22 DIAGNOSIS — F4321 Adjustment disorder with depressed mood: Secondary | ICD-10-CM

## 2019-06-22 MED ORDER — CITALOPRAM HYDROBROMIDE 40 MG PO TABS: 40.0000 mg | ORAL_TABLET | Freq: Every day | ORAL | 5 refills | Status: DC

## 2019-06-22 MED ORDER — CLONAZEPAM 0.5 MG PO TABS: 0.5000 mg | ORAL_TABLET | Freq: Three times a day (TID) | ORAL | 0 refills | Status: DC

## 2019-06-22 NOTE — Assessment & Plan Note (Signed)
Acute on chronic Related to grandson's death Not ideally controlled, not suicidal Having decreased appetite, poor sleep, quivering in chest, anxiety and depression increase celexa to 40 mg daily F/u in 6 months, sooner if needed Will establish with new therapist

## 2019-06-22 NOTE — Assessment & Plan Note (Signed)
Still grieving her grandson's death Has anxiety, depression, sleep difficulty, decreased appetite  Will establish with a new therapist Will adjust medication to help F/u in 6 months

## 2019-06-22 NOTE — Patient Instructions (Addendum)
Increase clonazepam to three times a day.  Continue buspirone three times a day.  Increase citalopram to 40 mg daily.   Follow up in 6 months, sooner if needed

## 2019-06-22 NOTE — Assessment & Plan Note (Signed)
Acute on chronic Related to grandson's death Not ideally controlled Having decreased appetite, poor sleep, quivering in chest, anxiety and depression Will increase clonazepam to TID and increase celexa to 40 mg daily Continue buspar at current dose F/u in 6 months, sooner if needed Will establish with new therapist

## 2019-06-22 NOTE — Progress Notes (Signed)
Subjective:    Patient ID: [name], female    DOB: 1945-09-03, 74 y.o.   MRN: [id number]  HPI The patient is here for follow up of her anxiety, depression and grief.  Her therapist has retired and she is trying to get in with a new one at the same practice.     The vibration sense she has been feeling since her grandson passed has gone away.  She still gets a quivering feeling and her heart speeds up.   The clonazepam helps this.  She has increased this to 3 times a day.   She has some difficulty sleeping and dreams a lot.   She is trying to eat as much as she can.  She wants to gain weight, but has not been able to.   She will not hurt herself, but wishes she could sleep to help the pain go away for a short time.   Medications and allergies reviewed with patient and updated if appropriate.  Patient Active Problem List   Diagnosis Date Noted  . Wrist laceration, left, initial encounter 05/09/2019  . Grief 03/25/2019  . DDD (degenerative disc disease), cervical 09/24/2018  . Pain in left knee 03/17/2018  . Conductive hearing loss, bilateral 03/10/2018  . Prediabetes 12/17/2017  . Neck pain 04/28/2017  . Dizziness 02/26/2017  . B12 deficiency 12/16/2016  . Chest discomfort 05/07/2016  . Back pain 05/07/2016  . Asthma 04/22/2016  . Family history of diabetes mellitus (DM) 12/07/2015  . Constipation due to outlet dysfunction   . Obstructive sleep apnea 07/12/2015  . Seasonal and perennial allergic rhinitis 07/12/2015  . Vitamin D deficiency 12/05/2014  . Rhinitis, chronic 05/30/2014  . Vaginal atrophy 10/20/2012  . Insomnia 10/20/2012  . Orthostatic hypotension 02/16/2012  . Cough 02/13/2011  . Palpitations 12/31/2010  . GERD (gastroesophageal reflux disease) 07/25/2010  . Lymphocytic colitis 06/14/2010  . Depression   . BACK PAIN, CHRONIC 12/27/2009  . Osteoporosis 12/27/2009  . SOB (shortness of breath) 06/25/2009  . Hereditary and idiopathic  peripheral neuropathy 12/22/2007  . Hyperlipidemia 03/09/2007  . Generalized anxiety disorder 03/09/2007  . MIGRAINE HEADACHE 03/09/2007  . IBS 03/09/2007  . SPONDYLOSIS 03/09/2007  . Myalgia and myositis 03/09/2007  . SCHATZKI'S RING, HX OF 03/09/2007  . Diaphragmatic hernia 12/30/2006  . GASTRITIS 02/17/2005  . DIVERTICULOSIS OF COLON 01/01/2000    Current Outpatient Medications on File Prior to Visit  Medication Sig Dispense Refill  . Azelastine-Fluticasone (DYMISTA) 137-50 MCG/ACT SUSP Place 2 sprays into the nose daily.     [name] b complex vitamins capsule Take 1 capsule by mouth daily.    . Biotin 1000 MCG tablet Take 1,000 mcg by mouth daily.      . busPIRone (BUSPAR) 7.5 MG tablet Take 1 tablet (7.5 mg total) by mouth 3 (three) times daily. 90 tablet 3  . Cholecalciferol (VITAMIN D-3) 125 MCG (5000 UT) TABS Take 1 tablet daily. 90 tablet 1  . citalopram (CELEXA) 20 MG tablet Take 1 tablet (20 mg total) by mouth daily. 30 tablet 1  . clonazePAM (KLONOPIN) 0.5 MG tablet TAKE 1 TABLET BY MOUTH TWICE A DAY 60 tablet 1  . fluticasone furoate-vilanterol (BREO ELLIPTA) 200-25 MCG/INH AEPB Inhale 1 puff into the lungs daily. 1 each 8  . Magnesium Oxide 250 MG TABS Take 250 mg by mouth daily.     . pantoprazole (PROTONIX) 40 MG tablet TAKE 1 TABLET BY MOUTH DAILY AS NEEDED 30 tablet 1  .  Probiotic Product (ALIGN) 4 MG CAPS Take 1 capsule by mouth as needed.     . tretinoin (RETIN-A) 0.025 % cream APPLY 1 APPLICATION TO AFFECTED AREA AT BEDTIME  99  . vitamin C (ASCORBIC ACID) 500 MG tablet Take 500 mg by mouth daily.    . [DISCONTINUED] colesevelam (WELCHOL) 625 MG tablet Take 625 mg by mouth 2 (two) times daily with a meal. Take one tablet by mouth twice a day    . [DISCONTINUED] Hyoscyamine-Phenyltoloxamine ([place] NF) [id number] MG CAPS Take one tablet by mouth as needed for abdominal cramping 24 each 0   No current facility-administered medications on file prior to visit.    Past  Medical History:  Diagnosis Date  . Anxiety   . Complication of anesthesia   . Depression    generalized anxiety disorder  . Diverticulosis   . Duodenal diverticulum   . Family history of adverse reaction to anesthesia    Mother and Daughters- N/V  . Fibromyalgia   . Gastroesophageal reflux disease with hiatal hernia   . Hiatal hernia   . History of kidney stones   . Hyperlipidemia   . IBS (irritable bowel syndrome)   . Lymphocytic colitis    Dr Sharlett Iles  . Migraine headache   . Mitral valve prolapse   . Neuropathy   . Osteopenia    BMD ordered by GYN  . Peripheral neuropathy    treated as RLS by  Neurology  . PONV (postoperative nausea and vomiting)   . Restless leg syndrome   . Schatzki's ring    History of  . Sleep apnea    On CPAP, has not been using  . Spondylosis     Past Surgical History:  Procedure Laterality Date  . ANAL RECTAL MANOMETRY N/A 11/14/2015   Procedure: ANO RECTAL MANOMETRY;  Surgeon: Gatha Mayer, MD;  Location: [facility];  Service: Endoscopy;  Laterality: N/A;  . BREAST ENHANCEMENT SURGERY    . CATARACT EXTRACTION Bilateral   . cataract surgery Left    Dr Bing Plume  . CHOLECYSTECTOMY    . COLONOSCOPY    . ESOPHAGEAL DILATION     X 2  . ROTATOR CUFF REPAIR Left   . SINUS ENDO W/FUSION N/A 05/30/2014   Procedure: REVISION  FRONTAL SINUS SURGERY WITH FUSION SCAN;  Surgeon: Jerrell Belfast, MD;  Location: [facility];  Service: ENT;  Laterality: N/A;  . SINUS SURGERY WITH INSTATRAK     x 2  . TUBAL LIGATION    . Vaginal cystectomy     x 2  . VAGINAL HYSTERECTOMY  05/2007   Vaginal repair, Dr Ubaldo Glassing.  Partial  hysterectomy.    Social History   Socioeconomic History  . Marital status: Married    Spouse name: Not on file  . Number of children: 2  . Years of education: 84  . Highest education level: Not on file  Occupational History  . Occupation: retired    Fish farm manager: [employer]  Tobacco Use  . Smoking status: Former Smoker    Packs/day:  0.50    Years: 15.00    Pack years: 7.50    Types: Cigarettes    Quit date: 03/03/1998    Years since quitting: 21.3  . Smokeless tobacco: Never Used  . Tobacco comment: smoked 1973- 2006, up to <  1  ppd  Substance and Sexual Activity  . Alcohol use: No    Alcohol/week: 0.0 standard drinks  . Drug use: No  .  Sexual activity: Yes    Partners: Male    Comment: 1st intercourse- 57, partners- 2, married- 55 yrs   Other Topics Concern  . Not on file  Social History Narrative   HAS REGULAR EXERCISE   DAILY CAFFEINE: 1-2 CUPS   Patient is right handed.   Social Determinants of Health   Financial Resource Strain:   . Difficulty of Paying Living Expenses:   Food Insecurity:   . Worried About Charity fundraiser in the Last Year:   . Arboriculturist in the Last Year:   Transportation Needs:   . Film/video editor (Medical):   [name] Lack of Transportation (Non-Medical):   Physical Activity:   . Days of Exercise per Week:   . Minutes of Exercise per Session:   Stress:   . Feeling of Stress :   Social Connections:   . Frequency of Communication with Friends and Family:   . Frequency of Social Gatherings with Friends and Family:   . Attends Religious Services:   . Active Member of Clubs or Organizations:   . Attends Archivist Meetings:   [name] Marital Status:     Family History  Problem Relation Age of Onset  . Hypertension Father 3  . Stroke Father 23  . Heart attack Father         ? in 68s  . Hypertension Mother   . Neuropathy Mother   . Anemia Mother   . Coronary artery disease Brother        Stent placement in 60s  . Diabetes Maternal Uncle   . Heart attack Paternal [place]el [place] , ? age  . Colon cancer Neg Hx   . Seizures Neg Hx     Review of Systems  Constitutional: Positive for appetite change (decreased).  Psychiatric/Behavioral: Positive for dysphoric mood and sleep disturbance. Negative for suicidal ideas. The patient is nervous/anxious.         Objective:   Vitals:   06/22/19 1308  BP: 112/64  Pulse: 61  Resp: 16  Temp: 98.3 F (36.8 C)  SpO2: 99%   BP Readings from Last 3 Encounters:  06/22/19 112/64  05/18/19 120/80  05/09/19 140/80   Wt Readings from Last 3 Encounters:  06/22/19 121 lb (54.9 kg)  05/18/19 120 lb 2 oz (54.5 kg)  04/30/19 121 lb 0.5 oz (54.9 kg)   Body mass index is 18.67 kg/m.   Physical Exam Constitutional:      General: She is not in acute distress.    Appearance: Normal appearance. She is not ill-appearing.  HENT:     Head: Normocephalic and atraumatic.  Skin:    General: Skin is warm and dry.  Neurological:     Mental Status: She is alert.  Psychiatric:        Behavior: Behavior normal.        Thought Content: Thought content normal.        Judgment: Judgment normal.     Comments: Depressed affect           Assessment & Plan:    See Problem List for Assessment and Plan of chronic medical problems.    This visit occurred during the SARS-CoV-2 public health emergency.  Safety protocols were in place, including screening questions prior to the visit, additional usage of staff PPE, and extensive cleaning of exam room while observing appropriate contact time as indicated for disinfecting solutions.

## 2019-07-11 ENCOUNTER — Ambulatory Visit (INDEPENDENT_AMBULATORY_CARE_PROVIDER_SITE_OTHER): Payer: 59 | Admitting: Psychologist

## 2019-07-11 DIAGNOSIS — Z634 Disappearance and death of family member: Secondary | ICD-10-CM | POA: Diagnosis not present

## 2019-07-11 DIAGNOSIS — F321 Major depressive disorder, single episode, moderate: Secondary | ICD-10-CM

## 2019-07-21 ENCOUNTER — Ambulatory Visit: Payer: 59 | Admitting: Internal Medicine

## 2019-07-21 ENCOUNTER — Other Ambulatory Visit: Payer: Self-pay

## 2019-07-21 ENCOUNTER — Other Ambulatory Visit: Payer: 59

## 2019-07-21 ENCOUNTER — Ambulatory Visit (INDEPENDENT_AMBULATORY_CARE_PROVIDER_SITE_OTHER): Payer: 59 | Admitting: Psychologist

## 2019-07-21 ENCOUNTER — Encounter: Payer: Self-pay | Admitting: Internal Medicine

## 2019-07-21 VITALS — BP 114/78 | HR 66 | Temp 98.0°F | Resp 16 | Ht 67.5 in | Wt 116.0 lb

## 2019-07-21 DIAGNOSIS — E559 Vitamin D deficiency, unspecified: Secondary | ICD-10-CM | POA: Diagnosis not present

## 2019-07-21 DIAGNOSIS — L659 Nonscarring hair loss, unspecified: Secondary | ICD-10-CM | POA: Diagnosis not present

## 2019-07-21 DIAGNOSIS — F321 Major depressive disorder, single episode, moderate: Secondary | ICD-10-CM | POA: Diagnosis not present

## 2019-07-21 DIAGNOSIS — Z634 Disappearance and death of family member: Secondary | ICD-10-CM | POA: Diagnosis not present

## 2019-07-21 DIAGNOSIS — R634 Abnormal weight loss: Secondary | ICD-10-CM | POA: Insufficient documentation

## 2019-07-21 DIAGNOSIS — M810 Age-related osteoporosis without current pathological fracture: Secondary | ICD-10-CM

## 2019-07-21 DIAGNOSIS — E538 Deficiency of other specified B group vitamins: Secondary | ICD-10-CM | POA: Diagnosis not present

## 2019-07-21 DIAGNOSIS — F411 Generalized anxiety disorder: Secondary | ICD-10-CM

## 2019-07-21 LAB — CBC WITH DIFFERENTIAL/PLATELET
Basophils Absolute: 0.1 10*3/uL (ref 0.0–0.1)
Basophils Relative: 1.2 % (ref 0.0–3.0)
Eosinophils Absolute: 0.1 10*3/uL (ref 0.0–0.7)
Eosinophils Relative: 0.9 % (ref 0.0–5.0)
HCT: 37.8 % (ref 36.0–46.0)
Hemoglobin: 12.7 g/dL (ref 12.0–15.0)
Lymphocytes Relative: 23.9 % (ref 12.0–46.0)
Lymphs Abs: 1.5 10*3/uL (ref 0.7–4.0)
MCHC: 33.5 g/dL (ref 30.0–36.0)
MCV: 90.8 fl (ref 78.0–100.0)
Monocytes Absolute: 0.5 10*3/uL (ref 0.1–1.0)
Monocytes Relative: 8.6 % (ref 3.0–12.0)
Neutro Abs: 4 10*3/uL (ref 1.4–7.7)
Neutrophils Relative %: 65.4 % (ref 43.0–77.0)
Platelets: 237 10*3/uL (ref 150.0–400.0)
RBC: 4.17 Mil/uL (ref 3.87–5.11)
RDW: 13.9 % (ref 11.5–15.5)
WBC: 6.2 10*3/uL (ref 4.0–10.5)

## 2019-07-21 LAB — COMPREHENSIVE METABOLIC PANEL
ALT: 13 U/L (ref 0–35)
AST: 21 U/L (ref 0–37)
Albumin: 4.4 g/dL (ref 3.5–5.2)
Alkaline Phosphatase: 64 U/L (ref 39–117)
BUN: 15 mg/dL (ref 6–23)
CO2: 25 mEq/L (ref 19–32)
Calcium: 9.3 mg/dL (ref 8.4–10.5)
Chloride: 102 mEq/L (ref 96–112)
Creatinine, Ser: 0.69 mg/dL (ref 0.40–1.20)
GFR: 82.85 mL/min (ref >60.00)
Glucose, Bld: 103 mg/dL — ABNORMAL HIGH (ref 70–99)
Potassium: 3.9 mEq/L (ref 3.5–5.1)
Sodium: 134 mEq/L — ABNORMAL LOW (ref 135–145)
Total Bilirubin: 0.4 mg/dL (ref 0.2–1.2)
Total Protein: 6.6 g/dL (ref 6.0–8.3)

## 2019-07-21 LAB — TSH: TSH: 2.51 u[IU]/mL (ref 0.35–4.50)

## 2019-07-21 LAB — VITAMIN B12: Vitamin B-12: 899 pg/mL (ref 211–911)

## 2019-07-21 LAB — VITAMIN D 25 HYDROXY (VIT D DEFICIENCY, FRACTURES): VITD: 69.69 ng/mL (ref 30.00–100.00)

## 2019-07-21 MED ORDER — BUSPIRONE HCL 10 MG PO TABS: 10.0000 mg | ORAL_TABLET | Freq: Three times a day (TID) | ORAL | 3 refills | Status: DC

## 2019-07-21 NOTE — Assessment & Plan Note (Signed)
Chronic Not ideally controlled Advised she see a psychiatrist but she would like to hold off on that Seeing a therapist Will increase buspar to 10 mg TID F/u in 2 months

## 2019-07-21 NOTE — Assessment & Plan Note (Addendum)
Acute Has seen derm Diffuse loss of hair from head and has lost most of her eyebrows tsh normal last fall but will recheck Check B12, iron panel, cbc, cmp Stress is likely contributing

## 2019-07-21 NOTE — Addendum Note (Signed)
Addended by: [name] on: 07/21/2019 05:14 PM   Modules accepted: Level of Service

## 2019-07-21 NOTE — Assessment & Plan Note (Signed)
Taking vitamin d  Will check level 

## 2019-07-21 NOTE — Assessment & Plan Note (Signed)
Acute on chronic Has been losing weight - related to stress/anxiety Discussed increasing food intake - will try ensure/boost Will refer to nutrition

## 2019-07-21 NOTE — Patient Instructions (Addendum)
  Blood work was ordered.     Medications reviewed and updated.  Changes include :   Increase buspirone to 10 mg three times a day  Your prescription(s) have been submitted to your pharmacy. Please take as directed and contact our office if you believe you are having problem(s) with the medication(s).   Take 600 mg of calcium twice daily   A referral was ordered for a nutritionist.    Follow up with me in 2 months

## 2019-07-21 NOTE — Assessment & Plan Note (Signed)
Chronic Taking B12 Check level 

## 2019-07-21 NOTE — Assessment & Plan Note (Signed)
Advised calcium 600 mg twice daily Continue current vitamin d Check vitamin d level Has seen Dr Cruzita Lederer -did not want to take bisphosphonates or injections

## 2019-07-21 NOTE — Progress Notes (Signed)
Subjective:    Patient ID: [name], female    DOB: 10-Jul-1945, 74 y.o.   MRN: [id number]  HPI The patient is here for an acute visit.  Hair loss:  She has diffuse hair loss from her head.  There are no patches of hair loss.  She has lost most of her eyebrows.  She did see dermatology already and they wanted her thyroid checked and ferritin checked.  She has had a lot of stress   Weight loss: she has lost weight.  She has decreased appetite and can not eat much - she gets full quickly.  She wonders if she should see a nutritionist.   Her anxiety / stress level is very high.  The medication helps.  She is not sleeping.  She has just started with a new therapist.    OP:  She is worried about her bones.  She is taking the vitamin d .  She wants to know how much calcium to take.    Medications and allergies reviewed with patient and updated if appropriate.  Patient Active Problem List   Diagnosis Date Noted  . Wrist laceration, left, initial encounter 05/09/2019  . Grief 03/25/2019  . DDD (degenerative disc disease), cervical 09/24/2018  . Pain in left knee 03/17/2018  . Conductive hearing loss, bilateral 03/10/2018  . Prediabetes 12/17/2017  . Neck pain 04/28/2017  . Dizziness 02/26/2017  . B12 deficiency 12/16/2016  . Chest discomfort 05/07/2016  . Back pain 05/07/2016  . Asthma 04/22/2016  . Family history of diabetes mellitus (DM) 12/07/2015  . Constipation due to outlet dysfunction   . Obstructive sleep apnea 07/12/2015  . Seasonal and perennial allergic rhinitis 07/12/2015  . Vitamin D deficiency 12/05/2014  . Rhinitis, chronic 05/30/2014  . Vaginal atrophy 10/20/2012  . Insomnia 10/20/2012  . Orthostatic hypotension 02/16/2012  . Cough 02/13/2011  . Palpitations 12/31/2010  . GERD (gastroesophageal reflux disease) 07/25/2010  . Lymphocytic colitis 06/14/2010  . Depression   . BACK PAIN, CHRONIC 12/27/2009  . Osteoporosis 12/27/2009  . SOB  (shortness of breath) 06/25/2009  . Hereditary and idiopathic peripheral neuropathy 12/22/2007  . Hyperlipidemia 03/09/2007  . Generalized anxiety disorder 03/09/2007  . MIGRAINE HEADACHE 03/09/2007  . IBS 03/09/2007  . SPONDYLOSIS 03/09/2007  . Myalgia and myositis 03/09/2007  . SCHATZKI'S RING, HX OF 03/09/2007  . Diaphragmatic hernia 12/30/2006  . GASTRITIS 02/17/2005  . DIVERTICULOSIS OF COLON 01/01/2000    Current Outpatient Medications on File Prior to Visit  Medication Sig Dispense Refill  . Azelastine-Fluticasone (DYMISTA) 137-50 MCG/ACT SUSP Place 2 sprays into the nose daily.     [name] b complex vitamins capsule Take 1 capsule by mouth daily.    . Biotin 1000 MCG tablet Take 1,000 mcg by mouth daily.      . busPIRone (BUSPAR) 7.5 MG tablet Take 1 tablet (7.5 mg total) by mouth 3 (three) times daily. 90 tablet 3  . Cholecalciferol (VITAMIN D-3) 125 MCG (5000 UT) TABS Take 1 tablet daily. 90 tablet 1  . citalopram (CELEXA) 40 MG tablet Take 1 tablet (40 mg total) by mouth daily. 30 tablet 5  . clonazePAM (KLONOPIN) 0.5 MG tablet Take 1 tablet (0.5 mg total) by mouth in the morning, at noon, and at bedtime. 90 tablet 0  . fluticasone furoate-vilanterol (BREO ELLIPTA) 200-25 MCG/INH AEPB Inhale 1 puff into the lungs daily. 1 each 8  . Magnesium Oxide 250 MG TABS Take 250 mg by mouth daily.     [name]  pantoprazole (PROTONIX) 40 MG tablet TAKE 1 TABLET BY MOUTH DAILY AS NEEDED 30 tablet 1  . Probiotic Product (ALIGN) 4 MG CAPS Take 1 capsule by mouth as needed.     . tretinoin (RETIN-A) 0.025 % cream APPLY 1 APPLICATION TO AFFECTED AREA AT BEDTIME  99  . vitamin C (ASCORBIC ACID) 500 MG tablet Take 500 mg by mouth daily.    . [DISCONTINUED] colesevelam (WELCHOL) 625 MG tablet Take 625 mg by mouth 2 (two) times daily with a meal. Take one tablet by mouth twice a day    . [DISCONTINUED] Hyoscyamine-Phenyltoloxamine ([place] NF) [id number] MG CAPS Take one tablet by mouth as needed for abdominal  cramping 24 each 0   No current facility-administered medications on file prior to visit.    Past Medical History:  Diagnosis Date  . Anxiety   . Complication of anesthesia   . Depression    generalized anxiety disorder  . Diverticulosis   . Duodenal diverticulum   . Family history of adverse reaction to anesthesia    Mother and Daughters- N/V  . Fibromyalgia   . Gastroesophageal reflux disease with hiatal hernia   . Hiatal hernia   . History of kidney stones   . Hyperlipidemia   . IBS (irritable bowel syndrome)   . Lymphocytic colitis    Dr Sharlett Iles  . Migraine headache   . Mitral valve prolapse   . Neuropathy   . Osteopenia    BMD ordered by GYN  . Peripheral neuropathy    treated as RLS by  Neurology  . PONV (postoperative nausea and vomiting)   . Restless leg syndrome   . Schatzki's ring    History of  . Sleep apnea    On CPAP, has not been using  . Spondylosis     Past Surgical History:  Procedure Laterality Date  . ANAL RECTAL MANOMETRY N/A 11/14/2015   Procedure: ANO RECTAL MANOMETRY;  Surgeon: Gatha Mayer, MD;  Location: [facility];  Service: Endoscopy;  Laterality: N/A;  . BREAST ENHANCEMENT SURGERY    . CATARACT EXTRACTION Bilateral   . cataract surgery Left    Dr Bing Plume  . CHOLECYSTECTOMY    . COLONOSCOPY    . ESOPHAGEAL DILATION     X 2  . ROTATOR CUFF REPAIR Left   . SINUS ENDO W/FUSION N/A 05/30/2014   Procedure: REVISION  FRONTAL SINUS SURGERY WITH FUSION SCAN;  Surgeon: Jerrell Belfast, MD;  Location: [facility];  Service: ENT;  Laterality: N/A;  . SINUS SURGERY WITH INSTATRAK     x 2  . TUBAL LIGATION    . Vaginal cystectomy     x 2  . VAGINAL HYSTERECTOMY  05/2007   Vaginal repair, Dr Ubaldo Glassing.  Partial  hysterectomy.    Social History   Socioeconomic History  . Marital status: Married    Spouse name: Not on file  . Number of children: 2  . Years of education: 56  . Highest education level: Not on file  Occupational History  .  Occupation: retired    Fish farm manager: [employer]  Tobacco Use  . Smoking status: Former Smoker    Packs/day: 0.50    Years: 15.00    Pack years: 7.50    Types: Cigarettes    Quit date: 03/03/1998    Years since quitting: 21.3  . Smokeless tobacco: Never Used  . Tobacco comment: smoked 1973- 2006, up to <  1  ppd  Substance and Sexual Activity  .  Alcohol use: No    Alcohol/week: 0.0 standard drinks  . Drug use: No  . Sexual activity: Yes    Partners: Male    Comment: 1st intercourse- 21, partners- 2, married- 37 yrs   Other Topics Concern  . Not on file  Social History Narrative   HAS REGULAR EXERCISE   DAILY CAFFEINE: 1-2 CUPS   Patient is right handed.   Social Determinants of Health   Financial Resource Strain:   . Difficulty of Paying Living Expenses:   Food Insecurity:   . Worried About Charity fundraiser in the Last Year:   . Arboriculturist in the Last Year:   Transportation Needs:   . Film/video editor (Medical):   [name] Lack of Transportation (Non-Medical):   Physical Activity:   . Days of Exercise per Week:   . Minutes of Exercise per Session:   Stress:   . Feeling of Stress :   Social Connections:   . Frequency of Communication with Friends and Family:   . Frequency of Social Gatherings with Friends and Family:   . Attends Religious Services:   . Active Member of Clubs or Organizations:   . Attends Archivist Meetings:   [name] Marital Status:     Family History  Problem Relation Age of Onset  . Hypertension Father 26  . Stroke Father 69  . Heart attack Father         ? in 93s  . Hypertension Mother   . Neuropathy Mother   . Anemia Mother   . Coronary artery disease Brother        Stent placement in 60s  . Diabetes Maternal Uncle   . Heart attack Paternal Carmel [place] , ? age  . Colon cancer Neg Hx   . Seizures Neg Hx     Review of Systems  Constitutional: Positive for appetite change and unexpected weight change.    Psychiatric/Behavioral: Positive for dysphoric mood and sleep disturbance. The patient is nervous/anxious.        Objective:   Vitals:   07/21/19 1058  BP: 114/78  Pulse: 66  Resp: 16  Temp: 98 F (36.7 C)  SpO2: 99%   BP Readings from Last 3 Encounters:  07/21/19 114/78  06/22/19 112/64  05/18/19 120/80   Wt Readings from Last 3 Encounters:  07/21/19 116 lb (52.6 kg)  06/22/19 121 lb (54.9 kg)  05/18/19 120 lb 2 oz (54.5 kg)   Body mass index is 17.9 kg/m.   Physical Exam Constitutional:      General: She is not in acute distress.    Appearance: She is not ill-appearing.     Comments: Thin fragile female  HENT:     Head: Normocephalic and atraumatic.  Skin:    General: Skin is warm and dry.     Comments: Hair thinning on top of head - no patches of hair loss.  Medial eyebrows present but 3/4 of eyebrows are missing  Neurological:     Mental Status: She is alert.  Psychiatric:     Comments: Depressed mood            Assessment & Plan:    See Problem List for Assessment and Plan of chronic medical problems.    This visit occurred during the SARS-CoV-2 public health emergency.  Safety protocols were in place, including screening questions prior to the visit, additional usage of staff PPE, and extensive  cleaning of exam room while observing appropriate contact time as indicated for disinfecting solutions.

## 2019-07-22 ENCOUNTER — Telehealth: Payer: Self-pay

## 2019-07-22 LAB — IRON,TIBC AND FERRITIN PANEL
%SAT: 26 % (calc) (ref 16–45)
Ferritin: 66 ng/mL (ref 16–288)
Iron: 87 ug/dL (ref 45–160)
TIBC: 334 mcg/dL (calc) (ref 250–450)

## 2019-07-22 NOTE — Telephone Encounter (Signed)
Pt would like to know if you could prescribe something for her eye brow growth (latisse?) and a special shampoo for hair growth. She prefers to come to you.

## 2019-07-24 MED ORDER — BIMATOPROST 0.03 % EX SOLN
CUTANEOUS | 3 refills | Status: DC
Start: 2019-07-24 — End: 2021-07-01

## 2019-07-24 MED ORDER — MINOXIDIL 5 % EX FOAM: CUTANEOUS | 3 refills | Status: DC

## 2019-07-24 NOTE — Telephone Encounter (Signed)
Prescriptions sent - may need to see derm if not effective.

## 2019-07-25 NOTE — Telephone Encounter (Signed)
Pt aware and expressed understanding.  

## 2019-07-26 ENCOUNTER — Telehealth: Payer: Self-pay | Admitting: Internal Medicine

## 2019-07-26 ENCOUNTER — Other Ambulatory Visit: Payer: Self-pay | Admitting: Internal Medicine

## 2019-07-26 NOTE — Telephone Encounter (Signed)
Rx sent 

## 2019-07-26 NOTE — Telephone Encounter (Signed)
New message:   1.Medication Requested: Cholecalciferol (VITAMIN D3) 125 MCG (5000 UT) CAPS 2. Pharmacy (Name, Street, [place]): [id number] [place], [place] 3. On Med List: Yes  4. Last Visit with PCP: 07/21/19  5. Next visit date with PCP:   Agent: Please be advised that RX refills may take up to 3 business days. We ask that you follow-up with your pharmacy.

## 2019-08-09 ENCOUNTER — Ambulatory Visit: Payer: 59 | Admitting: Psychologist

## 2019-08-10 ENCOUNTER — Telehealth: Payer: Self-pay | Admitting: Internal Medicine

## 2019-08-10 MED ORDER — CLONAZEPAM 0.5 MG PO TABS: 0.5000 mg | ORAL_TABLET | Freq: Three times a day (TID) | ORAL | 0 refills | Status: DC

## 2019-08-10 NOTE — Telephone Encounter (Signed)
New message:    Pt is calling and states she would like this Clonazepam sent to this location.  [id number] [address], [place] [address] [phone]

## 2019-08-10 NOTE — Telephone Encounter (Signed)
Check [place] registry no refill history on file.Marland KitchenJohny [name]

## 2019-08-10 NOTE — Telephone Encounter (Signed)
Patient requesting a phone call, when rx sent.  1.Medication Requested:clonazePAM (KLONOPIN) 0.5 MG tablet  2. Pharmacy (Name, [place]):[id number] [place], [place]  3. On Med List: yes  4. Last Visit with PCP: 07/21/19  5. Next visit date with PCP:n/a   Agent: Please be advised that RX refills may take up to 3 business days. We ask that you follow-up with your pharmacy.

## 2019-09-14 ENCOUNTER — Encounter: Payer: Self-pay | Admitting: Internal Medicine

## 2019-09-14 ENCOUNTER — Other Ambulatory Visit: Payer: Self-pay

## 2019-09-14 ENCOUNTER — Ambulatory Visit: Payer: 59 | Admitting: Internal Medicine

## 2019-09-14 VITALS — BP 106/70 | HR 67 | Temp 98.0°F | Ht 67.5 in

## 2019-09-14 DIAGNOSIS — F411 Generalized anxiety disorder: Secondary | ICD-10-CM

## 2019-09-14 DIAGNOSIS — E871 Hypo-osmolality and hyponatremia: Secondary | ICD-10-CM | POA: Diagnosis not present

## 2019-09-14 DIAGNOSIS — D649 Anemia, unspecified: Secondary | ICD-10-CM

## 2019-09-14 DIAGNOSIS — Z8781 Personal history of (healed) traumatic fracture: Secondary | ICD-10-CM

## 2019-09-14 DIAGNOSIS — I951 Orthostatic hypotension: Secondary | ICD-10-CM

## 2019-09-14 DIAGNOSIS — F3289 Other specified depressive episodes: Secondary | ICD-10-CM

## 2019-09-14 DIAGNOSIS — R110 Nausea: Secondary | ICD-10-CM

## 2019-09-14 DIAGNOSIS — Z9889 Other specified postprocedural states: Secondary | ICD-10-CM

## 2019-09-14 MED ORDER — CLONAZEPAM 0.5 MG PO TABS: 0.5000 mg | ORAL_TABLET | Freq: Two times a day (BID) | ORAL | 0 refills | Status: DC

## 2019-09-14 MED ORDER — TRAMADOL HCL 50 MG PO TABS: 50.0000 mg | ORAL_TABLET | Freq: Three times a day (TID) | ORAL | 0 refills | Status: DC | PRN

## 2019-09-14 MED ORDER — ONDANSETRON 4 MG PO TBDP
4.0000 mg | ORAL_TABLET | Freq: Three times a day (TID) | ORAL | 1 refills | Status: DC | PRN
Start: 2019-09-14 — End: 2020-05-18

## 2019-09-14 NOTE — Patient Instructions (Addendum)
Follow up Cardiology.    Dr. Philemon Kingdom, MD [id number]  [phone]  Have blood work next week at the [place] building.    Medications reviewed and updated.  Changes include :   Tramadol for pain.  zofran for nausea as needed.    Your prescription(s) have been submitted to your pharmacy. Please take as directed and contact our office if you believe you are having problem(s) with the medication(s).  A referral was ordered for Psychiatry.    Someone from their office will call you to schedule an appointment.

## 2019-09-14 NOTE — Progress Notes (Signed)
Subjective:    Patient ID: [name], female    DOB: 1945/03/28, 74 y.o.   MRN: [id number]  HPI The patient is here for follow up of her anxiety and depression.  She also went to the hospital in [facility] after a hip injury 6/16. She fell and broke her left hip and she had surgery in June.  Her BP was so low she was not able to move to the therapy floor.  She then did rehab in the hospital.  She is on midodrine.  Her celexa and clonazepam doses were decreased due to her low BP.     We increased her buspar two months ago.   She takes celexa daily and clonazepam daily.   She take the tylenol for pain.     She still has nausea.  The pain is not controlled.    Medications and allergies reviewed with patient and updated if appropriate.  Patient Active Problem List   Diagnosis Date Noted  . Hair loss 07/21/2019  . Weight loss 07/21/2019  . Grief 03/25/2019  . DDD (degenerative disc disease), cervical 09/24/2018  . Pain in left knee 03/17/2018  . Conductive hearing loss, bilateral 03/10/2018  . Prediabetes 12/17/2017  . Neck pain 04/28/2017  . Dizziness 02/26/2017  . B12 deficiency 12/16/2016  . Chest discomfort 05/07/2016  . Back pain 05/07/2016  . Asthma 04/22/2016  . Family history of diabetes mellitus (DM) 12/07/2015  . Constipation due to outlet dysfunction   . Obstructive sleep apnea 07/12/2015  . Seasonal and perennial allergic rhinitis 07/12/2015  . Vitamin D deficiency 12/05/2014  . Rhinitis, chronic 05/30/2014  . Vaginal atrophy 10/20/2012  . Insomnia 10/20/2012  . Orthostatic hypotension 02/16/2012  . Cough 02/13/2011  . Palpitations 12/31/2010  . GERD (gastroesophageal reflux disease) 07/25/2010  . Lymphocytic colitis 06/14/2010  . Depression   . BACK PAIN, CHRONIC 12/27/2009  . Osteoporosis 12/27/2009  . SOB (shortness of breath) 06/25/2009  . Hereditary and idiopathic peripheral neuropathy 12/22/2007  . Hyperlipidemia 03/09/2007  . Generalized  anxiety disorder 03/09/2007  . MIGRAINE HEADACHE 03/09/2007  . IBS 03/09/2007  . SPONDYLOSIS 03/09/2007  . Myalgia and myositis 03/09/2007  . SCHATZKI'S RING, HX OF 03/09/2007  . Diaphragmatic hernia 12/30/2006  . GASTRITIS 02/17/2005  . DIVERTICULOSIS OF COLON 01/01/2000    Current Outpatient Medications on File Prior to Visit  Medication Sig Dispense Refill  . Azelastine-Fluticasone (DYMISTA) 137-50 MCG/ACT SUSP Place 2 sprays into the nose daily.     [name] b complex vitamins capsule Take 1 capsule by mouth daily.    . bimatoprost (LATISSE) 0.03 % ophthalmic solution Place one drop on applicator and apply evenly along the skin of the eyebrows once daily at bedtime 3 mL 3  . Biotin 1000 MCG tablet Take 1,000 mcg by mouth daily.      . busPIRone (BUSPAR) 10 MG tablet Take 1 tablet (10 mg total) by mouth 3 (three) times daily. 90 tablet 3  . carbamazepine (TEGRETOL) 100 MG chewable tablet carbamazepine 100 mg chewable tablet    . Cholecalciferol (VITAMIN D3) 125 MCG (5000 UT) CAPS TAKE 1 CAPSULE BY MOUTH EVERY DAY 90 capsule 1  . citalopram (CELEXA) 20 MG tablet Take 20 mg by mouth daily.    . cycloSPORINE (RESTASIS) 0.05 % ophthalmic emulsion Restasis 0.05 % eye drops in a dropperette    . enoxaparin (LOVENOX) 40 MG/0.4ML injection Inject into the skin daily.    . fluticasone furoate-vilanterol (BREO ELLIPTA) 200-25 MCG/INH  AEPB Inhale 1 puff into the lungs daily. 1 each 8  . Magnesium Oxide 250 MG TABS Take 250 mg by mouth daily.     . midodrine (PROAMATINE) 10 MG tablet Take 10 mg by mouth 3 (three) times daily.    . Minoxidil 5 % FOAM Apply 0.5 capful to scalp twice daily.  Discontinue if no growth after 6 months 60 g 3  . pantoprazole (PROTONIX) 40 MG tablet TAKE 1 TABLET BY MOUTH DAILY AS NEEDED 30 tablet 1  . Probiotic Product (ALIGN) 4 MG CAPS Take 1 capsule by mouth as needed.     . sodium chloride 1 g tablet Take 1 g by mouth 3 (three) times daily.    [name] tretinoin (RETIN-A) 0.025 %  cream APPLY 1 APPLICATION TO AFFECTED AREA AT BEDTIME  99  . vitamin C (ASCORBIC ACID) 500 MG tablet Take 500 mg by mouth daily.    . [DISCONTINUED] colesevelam (WELCHOL) 625 MG tablet Take 625 mg by mouth 2 (two) times daily with a meal. Take one tablet by mouth twice a day    . [DISCONTINUED] Hyoscyamine-Phenyltoloxamine ([place] NF) [id number] MG CAPS Take one tablet by mouth as needed for abdominal cramping 24 each 0   No current facility-administered medications on file prior to visit.    Past Medical History:  Diagnosis Date  . Anxiety   . Complication of anesthesia   . Depression    generalized anxiety disorder  . Diverticulosis   . Duodenal diverticulum   . Family history of adverse reaction to anesthesia    Mother and Daughters- N/V  . Fibromyalgia   . Gastroesophageal reflux disease with hiatal hernia   . Hiatal hernia   . History of kidney stones   . Hyperlipidemia   . IBS (irritable bowel syndrome)   . Lymphocytic colitis    Dr Sharlett Iles  . Migraine headache   . Mitral valve prolapse   . Neuropathy   . Osteopenia    BMD ordered by GYN  . Peripheral neuropathy    treated as RLS by  Neurology  . PONV (postoperative nausea and vomiting)   . Restless leg syndrome   . Schatzki's ring    History of  . Sleep apnea    On CPAP, has not been using  . Spondylosis     Past Surgical History:  Procedure Laterality Date  . ANAL RECTAL MANOMETRY N/A 11/14/2015   Procedure: ANO RECTAL MANOMETRY;  Surgeon: Gatha Mayer, MD;  Location: [facility];  Service: Endoscopy;  Laterality: N/A;  . BREAST ENHANCEMENT SURGERY    . CATARACT EXTRACTION Bilateral   . cataract surgery Left    Dr Bing Plume  . CHOLECYSTECTOMY    . COLONOSCOPY    . ESOPHAGEAL DILATION     X 2  . ROTATOR CUFF REPAIR Left   . SINUS ENDO W/FUSION N/A 05/30/2014   Procedure: REVISION  FRONTAL SINUS SURGERY WITH FUSION SCAN;  Surgeon: Jerrell Belfast, MD;  Location: [facility];  Service: ENT;  Laterality: N/A;  .  SINUS SURGERY WITH INSTATRAK     x 2  . TUBAL LIGATION    . Vaginal cystectomy     x 2  . VAGINAL HYSTERECTOMY  05/2007   Vaginal repair, Dr Ubaldo Glassing.  Partial  hysterectomy.    Social History   Socioeconomic History  . Marital status: Married    Spouse name: Not on file  . Number of children: 2  . Years of education: 32  .  Highest education level: Not on file  Occupational History  . Occupation: retired    Fish farm manager: [employer]  Tobacco Use  . Smoking status: Former Smoker    Packs/day: 0.50    Years: 15.00    Pack years: 7.50    Types: Cigarettes    Quit date: 03/03/1998    Years since quitting: 21.5  . Smokeless tobacco: Never Used  . Tobacco comment: smoked 1973- 2006, up to <  1  ppd  Vaping Use  . Vaping Use: Never used  Substance and Sexual Activity  . Alcohol use: No    Alcohol/week: 0.0 standard drinks  . Drug use: No  . Sexual activity: Yes    Partners: Male    Comment: 1st intercourse- 27, partners- 2, married- 40 yrs   Other Topics Concern  . Not on file  Social History Narrative   HAS REGULAR EXERCISE   DAILY CAFFEINE: 1-2 CUPS   Patient is right handed.   Social Determinants of Health   Financial Resource Strain:   . Difficulty of Paying Living Expenses:   Food Insecurity:   . Worried About Charity fundraiser in the Last Year:   . Arboriculturist in the Last Year:   Transportation Needs:   . Film/video editor (Medical):   [name] Lack of Transportation (Non-Medical):   Physical Activity:   . Days of Exercise per Week:   . Minutes of Exercise per Session:   Stress:   . Feeling of Stress :   Social Connections:   . Frequency of Communication with Friends and Family:   . Frequency of Social Gatherings with Friends and Family:   . Attends Religious Services:   . Active Member of Clubs or Organizations:   . Attends Archivist Meetings:   [name] Marital Status:     Family History  Problem Relation Age of Onset  . Hypertension Father 65  .  Stroke Father 47  . Heart attack Father         ? in 22s  . Hypertension Mother   . Neuropathy Mother   . Anemia Mother   . Coronary artery disease Brother        Stent placement in 60s  . Diabetes Maternal Uncle   . Heart attack Paternal Carmel [place] , ? age  . Colon cancer Neg Hx   . Seizures Neg Hx     Review of Systems  Constitutional: Negative for chills and fever.  Respiratory: Negative for cough, shortness of breath and wheezing.   Cardiovascular: Positive for chest pain (a couple of episodes - likely panic) and palpitations (occ). Negative for leg swelling.  Neurological: Positive for light-headedness (occ) and headaches.       Objective:   Vitals:   09/14/19 1100  BP: 106/70  Pulse: 67  Temp: 98 F (36.7 C)  SpO2: 97%   BP Readings from Last 3 Encounters:  09/14/19 106/70  07/21/19 114/78  06/22/19 112/64   Wt Readings from Last 3 Encounters:  07/21/19 116 lb (52.6 kg)  06/22/19 121 lb (54.9 kg)  05/18/19 120 lb 2 oz (54.5 kg)   Body mass index is 17.9 kg/m.   Physical Exam    Constitutional: Appears well-developed and well-nourished. No distress.  HENT:  Head: Normocephalic and atraumatic.  Neck: Neck supple. No tracheal deviation present. No thyromegaly present.  No cervical lymphadenopathy Cardiovascular: Normal rate, regular rhythm and normal heart  sounds.   No murmur heard. No carotid bruit .  No edema Pulmonary/Chest: Effort normal and breath sounds normal. No respiratory distress. No has no wheezes. No rales.  Skin: Skin is warm and dry. Not diaphoretic.  Psychiatric: depressed mood and affect. Behavior is normal.      Assessment & Plan:    See Problem List for Assessment and Plan of chronic medical problems.    This visit occurred during the SARS-CoV-2 public health emergency.  Safety protocols were in place, including screening questions prior to the visit, additional usage of staff PPE, and extensive cleaning of exam room  while observing appropriate contact time as indicated for disinfecting solutions.

## 2019-09-16 DIAGNOSIS — R110 Nausea: Secondary | ICD-10-CM | POA: Insufficient documentation

## 2019-09-16 DIAGNOSIS — Z9889 Other specified postprocedural states: Secondary | ICD-10-CM | POA: Insufficient documentation

## 2019-09-16 DIAGNOSIS — Z8781 Personal history of (healed) traumatic fracture: Secondary | ICD-10-CM | POA: Insufficient documentation

## 2019-09-16 NOTE — Assessment & Plan Note (Signed)
Acute S/p ORIF after fx after fall Has f/u with ortho for Friday Pain not controlled Taking tylenol but only once in a while Can take up to 3000 mg / day of tylenol Tramadol prn for moderate-severe pain.  She has taken this previously.   Bowel regimen

## 2019-09-16 NOTE — Assessment & Plan Note (Signed)
Chronic Not ideally controlled Still struggling grandson's death Stressed re-connecting with therapist Current medication not helping enough - referred to psych

## 2019-09-16 NOTE — Assessment & Plan Note (Signed)
Chronic Not ideally controlled Still struggling grandson's death Stressed re-connecting with therapist Current medication not helping enough - referred to psych Continue current medications

## 2019-09-16 NOTE — Assessment & Plan Note (Signed)
Acute on chronic Has been on midodrine in the past for same celexa and clonazepam dose decreased Currently on midodrine To see cardiology

## 2019-09-16 NOTE — Assessment & Plan Note (Signed)
Acute Related to pain zofran prn

## 2019-09-21 ENCOUNTER — Other Ambulatory Visit: Payer: Self-pay | Admitting: Internal Medicine

## 2019-09-21 ENCOUNTER — Other Ambulatory Visit (INDEPENDENT_AMBULATORY_CARE_PROVIDER_SITE_OTHER): Payer: 59

## 2019-09-21 DIAGNOSIS — E871 Hypo-osmolality and hyponatremia: Secondary | ICD-10-CM | POA: Diagnosis not present

## 2019-09-21 DIAGNOSIS — D649 Anemia, unspecified: Secondary | ICD-10-CM | POA: Diagnosis not present

## 2019-09-21 LAB — CBC WITH DIFFERENTIAL/PLATELET
Basophils Absolute: 0.1 10*3/uL (ref 0.0–0.1)
Basophils Relative: 1 % (ref 0.0–3.0)
Eosinophils Absolute: 0.1 10*3/uL (ref 0.0–0.7)
Eosinophils Relative: 0.9 % (ref 0.0–5.0)
HCT: 35.5 % — ABNORMAL LOW (ref 36.0–46.0)
Hemoglobin: 11.6 g/dL — ABNORMAL LOW (ref 12.0–15.0)
Lymphocytes Relative: 18.7 % (ref 12.0–46.0)
Lymphs Abs: 1.2 10*3/uL (ref 0.7–4.0)
MCHC: 32.8 g/dL (ref 30.0–36.0)
MCV: 91.4 fl (ref 78.0–100.0)
Monocytes Absolute: 0.5 10*3/uL (ref 0.1–1.0)
Monocytes Relative: 7.6 % (ref 3.0–12.0)
Neutro Abs: 4.6 10*3/uL (ref 1.4–7.7)
Neutrophils Relative %: 71.8 % (ref 43.0–77.0)
Platelets: 259 10*3/uL (ref 150.0–400.0)
RBC: 3.88 Mil/uL (ref 3.87–5.11)
RDW: 14.5 % (ref 11.5–15.5)
WBC: 6.4 10*3/uL (ref 4.0–10.5)

## 2019-09-21 LAB — COMPREHENSIVE METABOLIC PANEL
ALT: 9 U/L (ref 0–35)
AST: 13 U/L (ref 0–37)
Albumin: 4.5 g/dL (ref 3.5–5.2)
Alkaline Phosphatase: 88 U/L (ref 39–117)
BUN: 21 mg/dL (ref 6–23)
CO2: 31 mEq/L (ref 19–32)
Calcium: 10.2 mg/dL (ref 8.4–10.5)
Chloride: 97 mEq/L (ref 96–112)
Creatinine, Ser: 0.64 mg/dL (ref 0.40–1.20)
GFR: 90.32 mL/min (ref >60.00)
Glucose, Bld: 92 mg/dL (ref 70–99)
Potassium: 5 mEq/L (ref 3.5–5.1)
Sodium: 133 mEq/L — ABNORMAL LOW (ref 135–145)
Total Bilirubin: 0.3 mg/dL (ref 0.2–1.2)
Total Protein: 7.2 g/dL (ref 6.0–8.3)

## 2019-09-21 NOTE — Addendum Note (Signed)
Addended by: Trenda Moots on: 07/26/8946 11:01 AM   Modules accepted: Orders

## 2019-09-21 NOTE — Telephone Encounter (Signed)
Check [place] registry last filled no history on patient. MD is out of the office pls advise.Marland KitchenJohny Chess

## 2019-09-28 ENCOUNTER — Telehealth: Payer: Self-pay | Admitting: Internal Medicine

## 2019-09-28 NOTE — Telephone Encounter (Signed)
Both are only minimally low and I expect the anemia to improve if she gets further from surgery  She can follow-up in 3 months we can recheck and then

## 2019-09-28 NOTE — Telephone Encounter (Signed)
    Please call patient to discuss lab results Call [phone]

## 2019-09-29 NOTE — Telephone Encounter (Signed)
Tried to reach patient but voicemail not set up yet. Will try and call her back tomorrow.

## 2019-10-03 ENCOUNTER — Other Ambulatory Visit: Payer: Self-pay

## 2019-10-03 NOTE — Telephone Encounter (Signed)
New message    The patient requests a call from the [place] on medication refill - it's complicated and will explain to the [place] when she calls back.

## 2019-10-04 NOTE — Telephone Encounter (Signed)
F/u    The patient is asking for a call back

## 2019-10-05 MED ORDER — CLONAZEPAM 0.5 MG PO TABS: 0.5000 mg | ORAL_TABLET | Freq: Two times a day (BID) | ORAL | 0 refills | Status: DC

## 2019-10-05 MED ORDER — CITALOPRAM HYDROBROMIDE 20 MG PO TABS: 20.0000 mg | ORAL_TABLET | Freq: Every day | ORAL | 1 refills | Status: DC

## 2019-10-05 NOTE — Telephone Encounter (Signed)
    She was supposed to see cardiology regarding the midodrine.  Other two medications sent

## 2019-10-06 NOTE — Telephone Encounter (Signed)
Notified pt w/MD response.../lmb 

## 2019-10-07 ENCOUNTER — Telehealth: Payer: Self-pay | Admitting: Cardiovascular Disease

## 2019-10-07 MED ORDER — MIDODRINE HCL 10 MG PO TABS: 10.0000 mg | ORAL_TABLET | Freq: Three times a day (TID) | ORAL | 3 refills | Status: DC

## 2019-10-07 NOTE — Telephone Encounter (Signed)
Pt c/o medication issue:  1. Name of Medication: midodrine   2. How are you currently taking this medication (dosage and times per day)? 10 mg po TID  3. Are you having a reaction (difficulty breathing--STAT)? no  4. What is your medication issue?   Recent [place] for [place] fx.  Was started on midodrine for hypotension with pain meds post op  Patient has been out for a week and doesn't know if Dr. Rockey Situ will fill  Scheduled 8/11 [place]   Patient states bp unknown at this time but husband will check when he gets home.

## 2019-10-07 NOTE — Telephone Encounter (Signed)
°  Patient Consent for Virtual Visit         [name] has provided verbal consent on 10/07/2019 for a virtual visit (video or telephone).   CONSENT FOR VIRTUAL VISIT FOR:  [name]  By participating in this virtual visit I agree to the following:  I hereby voluntarily request, consent and authorize [facility] and its employed or contracted physicians, physician assistants, nurse practitioners or other licensed health care professionals (the Practitioner), to provide me with telemedicine health care services (the Services") as deemed necessary by the treating Practitioner. I acknowledge and consent to receive the Services by the Practitioner via telemedicine. I understand that the telemedicine visit will involve communicating with the Practitioner through live audiovisual communication technology and the disclosure of certain medical information by electronic transmission. I acknowledge that I have been given the opportunity to request an in-person assessment or other available alternative prior to the telemedicine visit and am voluntarily participating in the telemedicine visit.  I understand that I have the right to withhold or withdraw my consent to the use of telemedicine in the course of my care at any time, without affecting my right to future care or treatment, and that the Practitioner or I may terminate the telemedicine visit at any time. I understand that I have the right to inspect all information obtained and/or recorded in the course of the telemedicine visit and may receive copies of available information for a reasonable fee.  I understand that some of the potential risks of receiving the Services via telemedicine include:   Delay or interruption in medical evaluation due to technological equipment failure or disruption;  Information transmitted may not be sufficient (e.g. poor resolution of images) to allow for appropriate medical decision  making by the Practitioner; and/or   In rare instances, security protocols could fail, causing a breach of personal health information.  Furthermore, I acknowledge that it is my responsibility to provide information about my medical history, conditions and care that is complete and accurate to the best of my ability. I acknowledge that Practitioner's advice, recommendations, and/or decision may be based on factors not within their control, such as incomplete or inaccurate data provided by me or distortions of diagnostic images or specimens that may result from electronic transmissions. I understand that the practice of medicine is not an exact science and that Practitioner makes no warranties or guarantees regarding treatment outcomes. I acknowledge that a copy of this consent can be made available to me via my patient portal ([facility]), or I can request a printed copy by calling the office of [facility].    I understand that my insurance will be billed for this visit.   I have read or had this consent read to me.  I understand the contents of this consent, which adequately explains the benefits and risks of the Services being provided via telemedicine.   I have been provided ample opportunity to ask questions regarding this consent and the Services and have had my questions answered to my satisfaction.  I give my informed consent for the services to be provided through the use of telemedicine in my medical care

## 2019-10-07 NOTE — Telephone Encounter (Signed)
Spoke with patient and refill sent. No further questions.

## 2019-10-12 ENCOUNTER — Telehealth (INDEPENDENT_AMBULATORY_CARE_PROVIDER_SITE_OTHER): Payer: 59 | Admitting: Cardiovascular Disease

## 2019-10-12 ENCOUNTER — Encounter: Payer: Self-pay | Admitting: Cardiovascular Disease

## 2019-10-12 VITALS — BP 113/62 | HR 62 | Ht 67.5 in | Wt 116.0 lb

## 2019-10-12 DIAGNOSIS — R079 Chest pain, unspecified: Secondary | ICD-10-CM | POA: Diagnosis not present

## 2019-10-12 DIAGNOSIS — R002 Palpitations: Secondary | ICD-10-CM

## 2019-10-12 DIAGNOSIS — I951 Orthostatic hypotension: Secondary | ICD-10-CM

## 2019-10-12 DIAGNOSIS — F418 Other specified anxiety disorders: Secondary | ICD-10-CM | POA: Diagnosis not present

## 2019-10-12 DIAGNOSIS — R0602 Shortness of breath: Secondary | ICD-10-CM

## 2019-10-12 NOTE — Progress Notes (Addendum)
Virtual Visit via Telephone Note   This visit type was conducted due to national recommendations for restrictions regarding the COVID-19 Pandemic (e.g. social distancing) in an effort to limit this patient's exposure and mitigate transmission in our community.  Due to her co-morbid illnesses, this patient is at least at moderate risk for complications without adequate follow up.  This format is felt to be most appropriate for this patient at this time.  The patient did not have access to video technology/had technical difficulties with video requiring transitioning to audio format only (telephone).  All issues noted in this document were discussed and addressed.  No physical exam could be performed with this format.  Please refer to the patient's chart for her  consent to telehealth for [facility].    Date:  10/12/2019   ID:  [name], DOB 1945/12/26, MRN [id number] The patient was identified using 2 identifiers.  Patient Location: Home Provider Location: Office/Clinic  PCP:  [name], MD  Cardiologist:  [place] Electrophysiologist:  None   Evaluation Performed:  Follow-Up Visit  Chief Complaint  Patient presents with  . OTHER    Per patient she went to ED because she broke her hip at [facility]. Her BP has been dropping kind of low. Some of the readings were 79/52 P 89, 99/56 P 73, 107/70 P 76. Medications verbally reviewed with patient.      History of Present Illness:    [name] is a 74 y.o. female with  hyperlipidemia, fibromyalgia,  despression extensive GI disease including hiatal hernia, GERD, gastritis, Schatzkes ring s/p dilatations  CT coronary calcium score of 0 September 2016 Obstructive sleep apnea, difficulty tolerating her CPAP presents for follow-up of her chronic chest heaviness, hypotension, dizziness  Lost grandson, 01/2019, motor vehicle accident  On discussion concerning recent events June 16th  2021 Was at [facility],  [place], over steps Broke hip Transported to emergency room In hospital 17 days  During surgery, drop in blood pressure Post procedure, low blood pressure, "unable to treat the pain, blood pressure was too low' Started on midodrine  Recent deaths in the family PMD helping with meds  Continues to take midodrine at home Sometimes holds the midodrine if pressure runs high  Has tramadol, "does not seem to work", 1 pill Q8  Uses a walker, hip still painful, trying to doing therapy  Other past medical history reviewed Stress test 07/14/2017 Normal wall motion, good exercise tolerance Target heart rate achieved on stress test No indication of blockage based on findings  Chronic neuropathy in her lower extremities Previous CT coronary calcium score discussed with her, score of 0 September 2016  The patient does not have symptoms concerning for COVID-19 infection (fever, chills, cough, or new shortness of breath).    Past Medical History:  Diagnosis Date  . Anxiety   . Complication of anesthesia   . Depression    generalized anxiety disorder  . Diverticulosis   . Duodenal diverticulum   . Family history of adverse reaction to anesthesia    Mother and Daughters- N/V  . Fibromyalgia   . Gastroesophageal reflux disease with hiatal hernia   . Hiatal hernia   . History of kidney stones   . Hyperlipidemia   . IBS (irritable bowel syndrome)   . Lymphocytic colitis    Dr Sharlett Iles  . Migraine headache   . Mitral valve prolapse   . Neuropathy   . Osteopenia    BMD ordered by  GYN  . Peripheral neuropathy    treated as RLS by  Neurology  . PONV (postoperative nausea and vomiting)   . Restless leg syndrome   . Schatzki's ring    History of  . Sleep apnea    On CPAP, has not been using  . Spondylosis    Past Surgical History:  Procedure Laterality Date  . ANAL RECTAL MANOMETRY N/A 11/14/2015   Procedure: ANO RECTAL MANOMETRY;  Surgeon: Gatha Mayer, MD;  Location: [facility]COPY;  Service: Endoscopy;  Laterality: N/A;  . BREAST ENHANCEMENT SURGERY    . CATARACT EXTRACTION Bilateral   . cataract surgery Left    Dr Bing Plume  . CHOLECYSTECTOMY    . COLONOSCOPY    . ESOPHAGEAL DILATION     X 2  . ROTATOR CUFF REPAIR Left   . SINUS ENDO W/FUSION N/A 05/30/2014   Procedure: REVISION  FRONTAL SINUS SURGERY WITH FUSION SCAN;  Surgeon: Jerrell Belfast, MD;  Location: [place];  Service: ENT;  Laterality: N/A;  . SINUS SURGERY WITH INSTATRAK     x 2  . TUBAL LIGATION    . Vaginal cystectomy     x 2  . VAGINAL HYSTERECTOMY  05/2007   Vaginal repair, Dr Ubaldo Glassing.  Partial  hysterectomy.     Current Meds  Medication Sig  . Azelastine-Fluticasone (DYMISTA) 137-50 MCG/ACT SUSP Place 2 sprays into the nose daily.   [name] b complex vitamins capsule Take 1 capsule by mouth daily.  . bimatoprost (LATISSE) 0.03 % ophthalmic solution Place one drop on applicator and apply evenly along the skin of the eyebrows once daily at bedtime  . Biotin 1000 MCG tablet Take 1,000 mcg by mouth daily.    . busPIRone (BUSPAR) 10 MG tablet Take 1 tablet (10 mg total) by mouth 3 (three) times daily.  . carbamazepine (TEGRETOL) 100 MG chewable tablet carbamazepine 100 mg chewable tablet  . Cholecalciferol (VITAMIN D3) 125 MCG (5000 UT) CAPS TAKE 1 CAPSULE BY MOUTH EVERY DAY  . citalopram (CELEXA) 20 MG tablet Take 1 tablet (20 mg total) by mouth daily.  . clonazePAM (KLONOPIN) 0.5 MG tablet Take 1 tablet (0.5 mg total) by mouth 2 (two) times daily.  . cycloSPORINE (RESTASIS) 0.05 % ophthalmic emulsion Restasis 0.05 % eye drops in a dropperette  . fluticasone furoate-vilanterol (BREO ELLIPTA) 200-25 MCG/INH AEPB Inhale 1 puff into the lungs daily.  . Magnesium Oxide 250 MG TABS Take 250 mg by mouth daily.   . midodrine (PROAMATINE) 10 MG tablet Take 1 tablet (10 mg total) by mouth 3 (three) times daily.  . Minoxidil 5 % FOAM Apply 0.5 capful to scalp twice daily.   Discontinue if no growth after 6 months  . ondansetron (ZOFRAN ODT) 4 MG disintegrating tablet Take 1 tablet (4 mg total) by mouth every 8 (eight) hours as needed for nausea or vomiting.  . pantoprazole (PROTONIX) 40 MG tablet TAKE 1 TABLET BY MOUTH DAILY AS NEEDED  . Probiotic Product (ALIGN) 4 MG CAPS Take 1 capsule by mouth as needed.   . senna (SENOKOT) 8.6 MG TABS tablet Take 1 tablet by mouth.  . sodium chloride 1 g tablet Take 1 g by mouth 3 (three) times daily.  . traMADol (ULTRAM) 50 MG tablet TAKE 1 TABLET BY MOUTH EVERY 8 HOURS AS NEEDED FOR UP TO 5 DAYS FOR SEVERE PAIN.  [name] tretinoin (RETIN-A) 0.025 % cream APPLY 1 APPLICATION TO AFFECTED AREA AT BEDTIME  . vitamin C (ASCORBIC ACID)  500 MG tablet Take 500 mg by mouth daily.     Allergies:   Adhesive [tape], Albuterol, Cymbalta [duloxetine hcl], Lyrica [pregabalin], Neurontin [gabapentin], Nitrofuran derivatives, Paxil [paroxetine hcl], and Prozac [fluoxetine hcl]   Social History   Tobacco Use  . Smoking status: Former Smoker    Packs/day: 0.50    Years: 15.00    Pack years: 7.50    Types: Cigarettes    Quit date: 03/03/1998    Years since quitting: 21.6  . Smokeless tobacco: Never Used  . Tobacco comment: smoked 1973- 2006, up to <  1  ppd  Vaping Use  . Vaping Use: Never used  Substance Use Topics  . Alcohol use: No    Alcohol/week: 0.0 standard drinks  . Drug use: No     Family Hx: The patient's family history includes Anemia in her mother; Coronary artery disease in her brother; Diabetes in her maternal uncle; Heart attack in her father and paternal uncle; Hypertension in her mother; Hypertension (age of onset: 43) in her father; Neuropathy in her mother; Stroke (age of onset: 67) in her father. There is no history of Colon cancer or Seizures.  ROS:   Please see the history of present illness.    Review of Systems  Constitutional: Negative.   HENT: Negative.   Respiratory: Negative.   Cardiovascular: Negative.     Gastrointestinal: Negative.   Musculoskeletal: Positive for joint pain.  Neurological: Negative.   Psychiatric/Behavioral: Positive for depression.  All other systems reviewed and are negative.    Prior CV studies:   The following studies were reviewed today:   Labs/Other Tests and Data Reviewed:    EKG:  No ECG reviewed.  Recent Labs: 07/21/2019: TSH 2.51 09/21/2019: ALT 9; BUN 21; Creatinine, Ser 0.64; Hemoglobin 11.6; Platelets 259.0; Potassium 5.0; Sodium 133   Recent Lipid Panel Lab Results  Component Value Date/Time   CHOL 215 (H) 12/22/2018 09:56 AM   CHOL 199 08/09/2014 10:05 AM   TRIG 106.0 12/22/2018 09:56 AM   TRIG 50 08/09/2014 10:05 AM   HDL 66.00 12/22/2018 09:56 AM   HDL 75 08/09/2014 10:05 AM   CHOLHDL 3 12/22/2018 09:56 AM   LDLCALC 128 (H) 12/22/2018 09:56 AM   LDLCALC 114 (H) 08/09/2014 10:05 AM    Wt Readings from Last 3 Encounters:  10/12/19 116 lb (52.6 kg)  07/21/19 116 lb (52.6 kg)  06/22/19 121 lb (54.9 kg)     Objective:    Vital Signs:  BP 113/62   Pulse 62   Ht 5' 7.5" (1.715 m)   Wt 116 lb (52.6 kg)   BMI 17.90 kg/m    VITAL SIGNS:  reviewed  Constitutional:  oriented to person, place, and time. No distress.  Psychiatric: Depressed behavior is normal. Thought content normal.    ASSESSMENT & PLAN:    Depression/anxiety Managed by primary care, She wanted to discuss the medication she is on for depression including BuSpar, Klonopin, Celexa Encouraged her to stay on these medications for now   Chest pain, atypical,  Prior testing benign No further testing needed at this time  Hip pain/hip fracture Reports tramadol is not helping her pain 1 pill every 8 hours Recommend she continue with her therapy, discussed with Dr. Quay Burow whether she could increase the tramadol Reports when she takes 1 tramadol, " does not feel anything" Potentially could try higher dose tramadol under the direction of Dr. Quay Burow If there is concern of  low blood pressure, could take  a midodrine with her tramadol  Hyperlipidemia, unspecified hyperlipidemia type -  Calcium score of 0  Palpitations -  Prior monitor with short runs of SVT, atrial tachycardia Not an issue on today's visit  Orthostatic hypotension Long hx, of relatively asymptomatic low blood pressure Certainly could have lower blood pressure in the setting of sedation or pain medications in the hospital This can be treated with midodrine 2.5, 5 up to 10 mg 3 times daily She is holding the midodrine when blood pressure is reasonable which would be appropriate.  Reports it runs higher when she is anxious and no situations did not take midodrine She might need midodrine when she is on 1 or more tramadol   COVID-19 Education: The signs and symptoms of COVID-19 were discussed with the patient and how to seek care for testing (follow up with PCP or arrange E-visit).  The importance of social distancing was discussed today.  Time:   Today, I have spent 20 minutes with the patient with telehealth technology discussing the above problems.     Medication Adjustments/Labs and Tests Ordered: Current medicines are reviewed at length with the patient today.  Concerns regarding medicines are outlined above.   Tests Ordered: No orders of the defined types were placed in this encounter.   Medication Changes: No orders of the defined types were placed in this encounter.    Signed, Ida Rogue, MD  10/12/2019 11:14 AM    [facility]

## 2019-10-12 NOTE — Patient Instructions (Signed)

## 2019-11-09 ENCOUNTER — Other Ambulatory Visit: Payer: Self-pay | Admitting: Internal Medicine

## 2019-11-25 ENCOUNTER — Other Ambulatory Visit (INDEPENDENT_AMBULATORY_CARE_PROVIDER_SITE_OTHER): Payer: 59

## 2019-11-25 ENCOUNTER — Encounter: Payer: Self-pay | Admitting: Physician Assistant

## 2019-11-25 ENCOUNTER — Ambulatory Visit: Payer: 59 | Admitting: Physician Assistant

## 2019-11-25 VITALS — BP 102/60 | HR 68 | Ht 67.0 in | Wt 117.0 lb

## 2019-11-25 DIAGNOSIS — R197 Diarrhea, unspecified: Secondary | ICD-10-CM

## 2019-11-25 DIAGNOSIS — R634 Abnormal weight loss: Secondary | ICD-10-CM | POA: Diagnosis not present

## 2019-11-25 DIAGNOSIS — R1032 Left lower quadrant pain: Secondary | ICD-10-CM

## 2019-11-25 DIAGNOSIS — R1031 Right lower quadrant pain: Secondary | ICD-10-CM

## 2019-11-25 LAB — CBC WITH DIFFERENTIAL/PLATELET
Basophils Absolute: 0.1 10*3/uL (ref 0.0–0.1)
Basophils Relative: 1 % (ref 0.0–3.0)
Eosinophils Absolute: 0 10*3/uL (ref 0.0–0.7)
Eosinophils Relative: 0.4 % (ref 0.0–5.0)
HCT: 38.7 % (ref 36.0–46.0)
Hemoglobin: 12.9 g/dL (ref 12.0–15.0)
Lymphocytes Relative: 18.4 % (ref 12.0–46.0)
Lymphs Abs: 1.3 10*3/uL (ref 0.7–4.0)
MCHC: 33.4 g/dL (ref 30.0–36.0)
MCV: 88.1 fl (ref 78.0–100.0)
Monocytes Absolute: 0.4 10*3/uL (ref 0.1–1.0)
Monocytes Relative: 6.2 % (ref 3.0–12.0)
Neutro Abs: 5.1 10*3/uL (ref 1.4–7.7)
Neutrophils Relative %: 74 % (ref 43.0–77.0)
Platelets: 231 10*3/uL (ref 150.0–400.0)
RBC: 4.39 Mil/uL (ref 3.87–5.11)
RDW: 14.9 % (ref 11.5–15.5)
WBC: 6.9 10*3/uL (ref 4.0–10.5)

## 2019-11-25 LAB — COMPREHENSIVE METABOLIC PANEL
ALT: 12 U/L (ref 0–35)
AST: 16 U/L (ref 0–37)
Albumin: 4.6 g/dL (ref 3.5–5.2)
Alkaline Phosphatase: 76 U/L (ref 39–117)
BUN: 17 mg/dL (ref 6–23)
CO2: 28 mEq/L (ref 19–32)
Calcium: 9.6 mg/dL (ref 8.4–10.5)
Chloride: 99 mEq/L (ref 96–112)
Creatinine, Ser: 0.55 mg/dL (ref 0.40–1.20)
GFR: 107.53 mL/min (ref >60.00)
Glucose, Bld: 92 mg/dL (ref 70–99)
Potassium: 3.7 mEq/L (ref 3.5–5.1)
Sodium: 135 mEq/L (ref 135–145)
Total Bilirubin: 0.3 mg/dL (ref 0.2–1.2)
Total Protein: 7.2 g/dL (ref 6.0–8.3)

## 2019-11-25 MED ORDER — DICYCLOMINE HCL 10 MG PO CAPS: 10.0000 mg | ORAL_CAPSULE | Freq: Four times a day (QID) | ORAL | 3 refills | Status: DC | PRN

## 2019-11-25 NOTE — Progress Notes (Addendum)
Chief Complaint: Diarrhea  HPI:    [name] is a 74 year old Caucasian female with a past medical history of IBS known to Dr. Carlean Purl, who presents clinic today with a complaint of diarrhea.      03/19/2018 patient saw Dr. Carlean Purl in clinic and described chronic left lower quadrant pain.  Also discussed her history of diarrheal illness when she was diagnosed lymphocytic colitis, but this is resolved.  Also described constipation issues.  Her anal manometry 3 years prior was discussed and pelvic PT was recommended because she had fecal leakage but she elected not to do that.  Also describes some bright red blood on the toilet paper at times for years.  Her last full colonoscopy was noted in 2012, flex sig in 2013.  No history of colonic neoplasia.  Was recommended she use MiraLAX on a daily basis and Dr. Arelia Longest anticipated considering a routine screening colonoscopy in 2022, though she would be 74 years old.  Saw her left lower quadrant pain could be radicular and she is can discuss this with another physician.    09/21/2019 CBC with a minimally decreased hemoglobin 11.6 (12.74 months prior).  CMP with a minimally decreased sodium at 133 and otherwise normal.    Today, the patient presents clinic and explains that she fell and broke her hip back in June around the 16th and was in the hospital, during that time was on pain medicines and became constipated, so much so that they had to disimpact her and kept her on stool softeners.  After she got home from the hospital she remained somewhat constipated until the beginning of August and then just started with diarrhea.  Stopped all stool softeners and tells me that she has at least 4-5 loose stools a day, anytime that she eats anything it just runs right through her, no matter what it is.  Describes that during the night she wears a pad because she will just pass stool in her sleep.  Apparently started Imodium about a week ago and has taken about 10  of these pills, as of today she has not had a bowel movement at all and did eat yogurt this morning.  Has also decreased her eating over the past 48 hours and attempt to decrease stool.  Describes generalized abdominal aching/pain.  She is worried she is dehydrated and feels like she has lost about 13 pounds since this all started.  Tells me she is still able to function at home though.    Denies fever, chills, nausea or vomiting.  Past Medical History:  Diagnosis Date  . Anxiety   . Complication of anesthesia   . Depression    generalized anxiety disorder  . Diverticulosis   . Duodenal diverticulum   . Family history of adverse reaction to anesthesia    Mother and Daughters- N/V  . Fibromyalgia   . Gastroesophageal reflux disease with hiatal hernia   . Hiatal hernia   . History of kidney stones   . Hyperlipidemia   . IBS (irritable bowel syndrome)   . Lymphocytic colitis    Dr Sharlett Iles  . Migraine headache   . Mitral valve prolapse   . Neuropathy   . Osteopenia    BMD ordered by GYN  . Peripheral neuropathy    treated as RLS by  Neurology  . PONV (postoperative nausea and vomiting)   . Restless leg syndrome   . Schatzki's ring    History of  . Sleep apnea  On CPAP, has not been using  . Spondylosis     Past Surgical History:  Procedure Laterality Date  . ANAL RECTAL MANOMETRY N/A 11/14/2015   Procedure: ANO RECTAL MANOMETRY;  Surgeon: Gatha Mayer, MD;  Location: [facility];  Service: Endoscopy;  Laterality: N/A;  . BREAST ENHANCEMENT SURGERY    . CATARACT EXTRACTION Bilateral   . cataract surgery Left    Dr Bing Plume  . CHOLECYSTECTOMY    . COLONOSCOPY    . ESOPHAGEAL DILATION     X 2  . ROTATOR CUFF REPAIR Left   . SINUS ENDO W/FUSION N/A 05/30/2014   Procedure: REVISION  FRONTAL SINUS SURGERY WITH FUSION SCAN;  Surgeon: Jerrell Belfast, MD;  Location: [place];  Service: ENT;  Laterality: N/A;  . SINUS SURGERY WITH INSTATRAK     x 2  . TUBAL LIGATION    .  Vaginal cystectomy     x 2  . VAGINAL HYSTERECTOMY  05/2007   Vaginal repair, Dr Ubaldo Glassing.  Partial  hysterectomy.    Current Outpatient Medications  Medication Sig Dispense Refill  . busPIRone (BUSPAR) 10 MG tablet TAKE 1 TABLET BY MOUTH THREE TIMES A DAY 90 tablet 3  . clonazePAM (KLONOPIN) 0.5 MG tablet TAKE 1 TABLET BY MOUTH 2 TIMES DAILY. 60 tablet 0  . Azelastine-Fluticasone (DYMISTA) 137-50 MCG/ACT SUSP Place 2 sprays into the nose daily.     [name] b complex vitamins capsule Take 1 capsule by mouth daily.    . bimatoprost (LATISSE) 0.03 % ophthalmic solution Place one drop on applicator and apply evenly along the skin of the eyebrows once daily at bedtime 3 mL 3  . Biotin 1000 MCG tablet Take 1,000 mcg by mouth daily.      . carbamazepine (TEGRETOL) 100 MG chewable tablet carbamazepine 100 mg chewable tablet    . Cholecalciferol (VITAMIN D3) 125 MCG (5000 UT) CAPS TAKE 1 CAPSULE BY MOUTH EVERY DAY 90 capsule 1  . citalopram (CELEXA) 20 MG tablet Take 1 tablet (20 mg total) by mouth daily. 90 tablet 1  . cycloSPORINE (RESTASIS) 0.05 % ophthalmic emulsion Restasis 0.05 % eye drops in a dropperette    . fluticasone furoate-vilanterol (BREO ELLIPTA) 200-25 MCG/INH AEPB Inhale 1 puff into the lungs daily. 1 each 8  . Magnesium Oxide 250 MG TABS Take 250 mg by mouth daily.     . midodrine (PROAMATINE) 10 MG tablet Take 1 tablet (10 mg total) by mouth 3 (three) times daily. 270 tablet 3  . Minoxidil 5 % FOAM Apply 0.5 capful to scalp twice daily.  Discontinue if no growth after 6 months 60 g 3  . ondansetron (ZOFRAN ODT) 4 MG disintegrating tablet Take 1 tablet (4 mg total) by mouth every 8 (eight) hours as needed for nausea or vomiting. 30 tablet 1  . pantoprazole (PROTONIX) 40 MG tablet TAKE 1 TABLET BY MOUTH DAILY AS NEEDED 30 tablet 1  . Probiotic Product (ALIGN) 4 MG CAPS Take 1 capsule by mouth as needed.     . senna (SENOKOT) 8.6 MG TABS tablet Take 1 tablet by mouth.    . sodium chloride 1  g tablet Take 1 g by mouth 3 (three) times daily.    . traMADol (ULTRAM) 50 MG tablet TAKE 1 TABLET BY MOUTH EVERY 8 HOURS AS NEEDED FOR UP TO 5 DAYS FOR SEVERE PAIN. 21 tablet 1  . tretinoin (RETIN-A) 0.025 % cream APPLY 1 APPLICATION TO AFFECTED AREA AT BEDTIME  99  .  vitamin C (ASCORBIC ACID) 500 MG tablet Take 500 mg by mouth daily.     No current facility-administered medications for this visit.    Allergies as of 11/25/2019 - Review Complete 10/12/2019  Allergen Reaction Noted  . Adhesive [tape] Rash 08/04/2011  . Albuterol  04/14/2016  . Cymbalta [duloxetine hcl]  08/15/2015  . Lyrica [pregabalin] Other (See Comments) 12/14/2012  . Neurontin [gabapentin] Other (See Comments) 12/14/2012  . Nitrofuran derivatives Other (See Comments) 10/11/2012  . Paxil [paroxetine hcl]  08/15/2015  . Prozac [fluoxetine hcl] Other (See Comments) 08/16/2015    Family History  Problem Relation Age of Onset  . Hypertension Father 74  . Stroke Father 45  . Heart attack Father         ? in 59s  . Hypertension Mother   . Neuropathy Mother   . Anemia Mother   . Coronary artery disease Brother        Stent placement in 60s  . Diabetes Maternal Uncle   . Heart attack Paternal [place]el [place] , ? age  . Colon cancer Neg Hx   . Seizures Neg Hx     Social History   Socioeconomic History  . Marital status: Married    Spouse name: Not on file  . Number of children: 2  . Years of education: 61  . Highest education level: Not on file  Occupational History  . Occupation: retired    Fish farm manager: [employer]  Tobacco Use  . Smoking status: Former Smoker    Packs/day: 0.50    Years: 15.00    Pack years: 7.50    Types: Cigarettes    Quit date: 03/03/1998    Years since quitting: 21.7  . Smokeless tobacco: Never Used  . Tobacco comment: smoked 1973- 2006, up to <  1  ppd  Vaping Use  . Vaping Use: Never used  Substance and Sexual Activity  . Alcohol use: No    Alcohol/week: 0.0 standard  drinks  . Drug use: No  . Sexual activity: Yes    Partners: Male    Comment: 1st intercourse- 37, partners- 2, married- 56 yrs   Other Topics Concern  . Not on file  Social History Narrative   HAS REGULAR EXERCISE   DAILY CAFFEINE: 1-2 CUPS   Patient is right handed.   Social Determinants of Health   Financial Resource Strain:   . Difficulty of Paying Living Expenses: Not on file  Food Insecurity:   . Worried About Charity fundraiser in the Last Year: Not on file  . Ran Out of Food in the Last Year: Not on file  Transportation Needs:   . Lack of Transportation (Medical): Not on file  . Lack of Transportation (Non-Medical): Not on file  Physical Activity:   . Days of Exercise per Week: Not on file  . Minutes of Exercise per Session: Not on file  Stress:   . Feeling of Stress : Not on file  Social Connections:   . Frequency of Communication with Friends and Family: Not on file  . Frequency of Social Gatherings with Friends and Family: Not on file  . Attends Religious Services: Not on file  . Active Member of Clubs or Organizations: Not on file  . Attends Archivist Meetings: Not on file  . Marital Status: Not on file  Intimate Partner Violence:   . Fear of Current or Ex-Partner: Not on file  .  Emotionally Abused: Not on file  . Physically Abused: Not on file  . Sexually Abused: Not on file    Review of Systems:    Constitutional: No fever or chills Cardiovascular: No chest pain Respiratory: No SOB Gastrointestinal: See HPI and otherwise negative   Physical Exam:  Vital signs: BP 102/60   Pulse 68   Ht 5\' 7"  (1.702 m)   Wt 117 lb (53.1 kg)   BMI 18.32 kg/m   Constitutional:   Pleasant frail appearing, thin Caucasian female appears to be in NAD, Well developed, Well nourished, alert and cooperative Respiratory: Respirations even and unlabored. Lungs clear to auscultation bilaterally.   No wheezes, crackles, or rhonchi.  Cardiovascular: Normal S1, S2. No  MRG. Regular rate and rhythm. No peripheral edema, cyanosis or pallor.  Gastrointestinal:  Soft, nondistended, mild generalized ttp. No rebound or guarding. Increased BS all four quadrants, No appreciable masses or hepatomegaly. Rectal:  Not performed.  Msk:  Symmetrical without gross deformities. Without edema, no deformity or joint abnormality. +ambulating with walker Psychiatric: Demonstrates good judgement and reason without abnormal affect or behaviors.  RELEVANT LABS AND IMAGING: CBC    Component Value Date/Time   WBC 6.4 09/21/2019 1102   RBC 3.88 09/21/2019 1102   HGB 11.6 (L) 09/21/2019 1102   HCT 35.5 (L) 09/21/2019 1102   PLT 259.0 09/21/2019 1102   MCV 91.4 09/21/2019 1102   MCH 30.0 05/30/2014 0630   MCHC 32.8 09/21/2019 1102   RDW 14.5 09/21/2019 1102   LYMPHSABS 1.2 09/21/2019 1102   MONOABS 0.5 09/21/2019 1102   EOSABS 0.1 09/21/2019 1102   BASOSABS 0.1 09/21/2019 1102    CMP     Component Value Date/Time   NA 133 (L) 09/21/2019 1102   K 5.0 09/21/2019 1102   CL 97 09/21/2019 1102   CO2 31 09/21/2019 1102   GLUCOSE 92 09/21/2019 1102   BUN 21 09/21/2019 1102   CREATININE 0.64 09/21/2019 1102   CALCIUM 10.2 09/21/2019 1102   PROT 7.2 09/21/2019 1102   PROT 6.4 11/30/2012 1535   ALBUMIN 4.5 09/21/2019 1102   ALBUMIN 4.3 04/20/2012 1018   AST 13 09/21/2019 1102   ALT 9 09/21/2019 1102   ALKPHOS 88 09/21/2019 1102   BILITOT 0.3 09/21/2019 1102   GFRNONAA 89 (L) 05/30/2014 0637   GFRAA >90 05/30/2014 2706    Assessment: 1.  Diarrhea: 4-5 times a day, none this morning, history of?  Lymphocytic colitis?,  Also recent hospitalization for hip repair, likely on antibiotics so she cannot remember; consider infectious versus inflammatory versus other cause 2.  Weight loss: 12 to 13 pounds over the past 3 months after breaking her hip and now diarrhea 3.  Fatigue: With all of the above  Plan: 1.  Labs today including CBC and CMP 2.  Ordered stool studies to  include a GI pathogen panel and fecal lactoferrin 3.  Would recommend the patient go ahead and schedule Imodium 1 tab every 6 hours.  If she becomes constipated she should back off of this medication. 4.  Patient asked if there is any stronger medicine than Imodium, told her I would wait to see if scheduling Imodium works before trying Lomotil. 5.  Told the patient that the focus should be on maintaining hydration.  She should try Gatorade Pedialyte, water etc. and eat as she feels able.  This may still cause some diarrhea, but it is better than becoming dehydrated. 6.  Discussed with the patient that if she  feels any worse then she needs to proceed to the ER.  She verbalized understanding. 7.  Also prescribe Dicyclomine 10 mg every 4-6 hours as needed for abdominal cramping #30 with 1 refill. 8.  Patient to follow in clinic with Dr. Carlean Purl or myself as instructed after labs above.  Told her to call in and let us know how she is doing on Monday.  Ellouise Newer, PA-C [facility] 11/25/2019, 2:59 PM  Cc: [name], MD

## 2019-11-25 NOTE — Patient Instructions (Addendum)
If you are age 76 or older, your body mass index should be between 23-30. Your Body mass index is 18.32 kg/m. If this is out of the aforementioned range listed, please consider follow up with your Primary Care Provider.  If you are age 15 or younger, your body mass index should be between 19-25. Your Body mass index is 18.32 kg/m. If this is out of the aformentioned range listed, please consider follow up with your Primary Care Provider.   Your provider has requested that you go to the basement level for lab work before leaving today. Press "B" on the elevator. The lab is located at the first door on the left as you exit the elevator.  We have sent the following medications to your pharmacy for you to pick up at your convenience: Dicyclomine 10 mg every 4-6 hours as needed for cramping.   Schedule Imodium every 6 hours.   If things get worse go to ER.   Make sure you are getting lots of fluids - Gatorade, Pedialyte, Powerade and water.

## 2019-11-28 ENCOUNTER — Other Ambulatory Visit: Payer: 59

## 2019-11-28 DIAGNOSIS — R197 Diarrhea, unspecified: Secondary | ICD-10-CM

## 2019-11-28 DIAGNOSIS — R634 Abnormal weight loss: Secondary | ICD-10-CM

## 2019-11-28 DIAGNOSIS — R1032 Left lower quadrant pain: Secondary | ICD-10-CM

## 2019-11-28 NOTE — Addendum Note (Signed)
Addended by: Oda Kilts on: 11/28/2019 10:46 AM   Modules accepted: Orders

## 2019-11-29 LAB — FECAL LACTOFERRIN, QUANT
Fecal Lactoferrin: NEGATIVE
MICRO NUMBER:: 10999063
SPECIMEN QUALITY:: ADEQUATE

## 2019-12-01 ENCOUNTER — Telehealth: Payer: Self-pay | Admitting: Physician Assistant

## 2019-12-01 NOTE — Telephone Encounter (Signed)
The pt has been advised that the results are not yet available and she will be contacted once [place] reviews.  The pt has been advised of the information and verbalized understanding.

## 2019-12-01 NOTE — Telephone Encounter (Signed)
Pt called inquiring about stool test results from Monday.

## 2019-12-02 LAB — GI PROFILE, STOOL, PCR

## 2019-12-02 NOTE — Telephone Encounter (Signed)
Pt called inquiring about stool test results, she is requesting some medication for diarrhea as imodium is not working, she keeps having watery diarrhea and does not want to go like that over the weekend. Her pharmacy is [facility] in [place]. Pt is requesting a call once med is sent to her pharmacy.

## 2019-12-02 NOTE — Telephone Encounter (Signed)
The pt has been scheduled for an appt on Monday 10/4 at 830 am.  She has been advised.

## 2019-12-02 NOTE — Telephone Encounter (Signed)
Dr Carlean Purl can you look at the stool testing and make recommendations for this pt?  She saw [facility] and [employer] is not here today. Thank you

## 2019-12-02 NOTE — Telephone Encounter (Addendum)
I called her and explained that stool studies are negative.  She needs to come in for an exam to see if she has an impaction causing overflow diarrhea and if not determined other testing if needed or other treatment.  She is willing to come Monday.  Please book her in one of my hemorrhoidal banding spots for Monday and call her with the date and time

## 2019-12-05 ENCOUNTER — Other Ambulatory Visit (INDEPENDENT_AMBULATORY_CARE_PROVIDER_SITE_OTHER): Payer: 59

## 2019-12-05 ENCOUNTER — Ambulatory Visit (INDEPENDENT_AMBULATORY_CARE_PROVIDER_SITE_OTHER): Payer: 59 | Admitting: Internal Medicine

## 2019-12-05 ENCOUNTER — Other Ambulatory Visit: Payer: Self-pay

## 2019-12-05 ENCOUNTER — Encounter: Payer: Self-pay | Admitting: Internal Medicine

## 2019-12-05 ENCOUNTER — Ambulatory Visit (INDEPENDENT_AMBULATORY_CARE_PROVIDER_SITE_OTHER)
Admission: RE | Admit: 2019-12-05 | Discharge: 2019-12-05 | Disposition: A | Discharge status: Home / Self Care | Payer: 59 | Source: Ambulatory Visit | Attending: Internal Medicine | Admitting: Internal Medicine

## 2019-12-05 VITALS — BP 90/60 | HR 68 | Ht 67.0 in | Wt 117.4 lb

## 2019-12-05 DIAGNOSIS — R197 Diarrhea, unspecified: Secondary | ICD-10-CM

## 2019-12-05 DIAGNOSIS — K602 Anal fissure, unspecified: Secondary | ICD-10-CM

## 2019-12-05 DIAGNOSIS — K921 Melena: Secondary | ICD-10-CM

## 2019-12-05 LAB — CBC WITH DIFFERENTIAL/PLATELET
Basophils Absolute: 0 10*3/uL (ref 0.0–0.1)
Basophils Relative: 0.4 % (ref 0.0–3.0)
Eosinophils Absolute: 0 10*3/uL (ref 0.0–0.7)
Eosinophils Relative: 0.2 % (ref 0.0–5.0)
HCT: 38.7 % (ref 36.0–46.0)
Hemoglobin: 12.6 g/dL (ref 12.0–15.0)
Lymphocytes Relative: 27.6 % (ref 12.0–46.0)
Lymphs Abs: 0.9 10*3/uL (ref 0.7–4.0)
MCHC: 32.6 g/dL (ref 30.0–36.0)
MCV: 87.7 fl (ref 78.0–100.0)
Monocytes Absolute: 0.3 10*3/uL (ref 0.1–1.0)
Monocytes Relative: 8.5 % (ref 3.0–12.0)
Neutro Abs: 2 10*3/uL (ref 1.4–7.7)
Neutrophils Relative %: 63.3 % (ref 43.0–77.0)
Platelets: 182 10*3/uL (ref 150.0–400.0)
RBC: 4.41 Mil/uL (ref 3.87–5.11)
RDW: 15.3 % (ref 11.5–15.5)
WBC: 3.2 10*3/uL — ABNORMAL LOW (ref 4.0–10.5)

## 2019-12-05 LAB — BASIC METABOLIC PANEL
BUN: 10 mg/dL (ref 6–23)
CO2: 28 mEq/L (ref 19–32)
Calcium: 8.9 mg/dL (ref 8.4–10.5)
Chloride: 101 mEq/L (ref 96–112)
Creatinine, Ser: 0.55 mg/dL (ref 0.40–1.20)
GFR: 107.52 mL/min (ref >60.00)
Glucose, Bld: 90 mg/dL (ref 70–99)
Potassium: 3.7 mEq/L (ref 3.5–5.1)
Sodium: 136 mEq/L (ref 135–145)

## 2019-12-05 NOTE — Patient Instructions (Addendum)
Your provider has requested that you go to the basement level for lab work before leaving today. Press "B" on the elevator. The lab is located at the first door on the left as you exit the elevator.  Due to recent changes in healthcare laws, you may see the results of your imaging and laboratory studies on MyChart before your provider has had a chance to review them.  We understand that in some cases there may be results that are confusing or concerning to you. Not all laboratory results come back in the same time frame and the provider may be waiting for multiple results in order to interpret others.  Please give [place] 48 hours in order for your provider to thoroughly review all the results before contacting the office for clarification of your results.   Normal BMI (Body Mass Index- based on height and weight) is between 23 and 30. Your BMI today is Body mass index is 18.38 kg/m. [name] Please consider follow up  regarding your BMI with your Primary Care Provider.    Please stop by the x-ray department before leaving today.     ORAL REHYDRATION SOLUTION RECIPES   Sugar and salt water   . 1 quart water .  teaspoon salt . 6 teaspoons sugar . Optional: Crystal Light to taste (especially lemonade or orange-pineapple flavors)   Gatorade G2   . 4 cups Gatorade G2 (or one, 32 ounce bottle) . 1/2 teaspoon salt   Chicken Broth   . 4 cups water . 1 dry chicken broth cube .  teaspoon salt . 2 tablespoon sugar OR . 2 cups liquid broth . 2 cups water . 2 tablespoon sugar  Tomato Juice   . 2  cups tomato juice . 1  cups water  Homemade Cereal Based  .  cup dry, precooked baby rice cereal . 2 cups water .  teaspoon salt . Combine ingredients and mix until well dissolved and smooth. Refrigerate. Solution should be thick, but pourable and drinkable.     I appreciate the opportunity to care for you. Silvano Rusk, MD, [facility]

## 2019-12-05 NOTE — Progress Notes (Signed)
[name] 74 y.o. 24-Aug-1945 [id number]  Assessment & Plan:   Encounter Diagnoses  Name Primary?  . Diarrhea, unspecified type Yes  . Black stool   . Anal fissure      Orders Placed This Encounter  Procedures  . DG Abd 2 Views  . CBC with Differential/Platelet  . Basic metabolic panel   X-rays shown a fair amount of stool in the sigmoid and descending colon. No sign of obstruction.  Labs were normal.  Have advised MiraLAX purge to see if that resolves her diarrhea.  Thinking maybe there is a higher impaction type situation.  If that fails she needs to have a sigmoidoscopy versus a colonoscopy I think.  Other option would be empiric trial of budesonide given history of lymphocytic colitis.  Subjective:   Chief Complaint: Diarrhea  HPI 74 year old white woman with a history of IBS with both diarrhea and constipation and lymphocytic colitis who was hospitalized with a hip fracture in the summer got constipated and then was seen in our office on November 25, 2019 with diarrhea.  Stools were dark as well.  That was thought most likely to be medication related and not bleeding.  GI pathogen panel and fecal lactoferrin were negative.  CMET was normal hemoglobin was 12.9 white count 6.9  She has been using regular Imodium and when she went to to the time 4 times a day she did seem to slow or stop the diarrhea over the weekend.  I had called her on October 1 letting her know stool studies were negative.  I thought she should come in to be evaluated for possible fecal impaction and paradoxical diarrhea.  She is complaining of lower abdominal planes as well.  Some nausea.  Complaining of being diffusely weak Allergies  Allergen Reactions  . Adhesive [Tape] Rash  . Albuterol     heartburn  . Cymbalta [Duloxetine Hcl]     Diarrhea, nausea, anxiety worse, shaky  . Lyrica [Pregabalin] Other (See Comments)    Sedation  . Neurontin [Gabapentin] Other (See Comments)      Sedation  . Nitrofuran Derivatives Other (See Comments)    "tingling"  . Paxil [Paroxetine Hcl]     Bowel upset, tingling  . Prozac [Fluoxetine Hcl] Other (See Comments)    Made patient worse and constipation   Current Meds  Medication Sig  . AMBULATORY NON FORMULARY MEDICATION Bone 2 tablets twice a day  . Azelastine-Fluticasone (DYMISTA) 137-50 MCG/ACT SUSP Place 2 sprays into the nose daily.  . bimatoprost (LATISSE) 0.03 % ophthalmic solution Place one drop on applicator and apply evenly along the skin of the eyebrows once daily at bedtime  . busPIRone (BUSPAR) 10 MG tablet TAKE 1 TABLET BY MOUTH THREE TIMES A DAY  . CALCIUM-VITAMIN D PO Take 1 tablet by mouth daily.  . Cholecalciferol (VITAMIN D3) 125 MCG (5000 UT) CAPS TAKE 1 CAPSULE BY MOUTH EVERY DAY  . citalopram (CELEXA) 20 MG tablet Take 1 tablet (20 mg total) by mouth daily.  . clonazePAM (KLONOPIN) 0.5 MG tablet TAKE 1 TABLET BY MOUTH 2 TIMES DAILY.  [name] Cyanocobalamin (VITAMIN B-12 PO) Take 1 tablet by mouth daily.  . cycloSPORINE (RESTASIS) 0.05 % ophthalmic emulsion Restasis 0.05 % eye drops in a dropperette  . dicyclomine (BENTYL) 10 MG capsule Take 10 mg by mouth every 6 (six) hours as needed for spasms.  . ondansetron (ZOFRAN ODT) 4 MG disintegrating tablet Take 1 tablet (4 mg total) by mouth every 8 (  eight) hours as needed for nausea or vomiting.  . pantoprazole (PROTONIX) 40 MG tablet TAKE 1 TABLET BY MOUTH DAILY AS NEEDED  . Probiotic Product (ALIGN) 4 MG CAPS Take 1 capsule by mouth as needed.   . tretinoin (RETIN-A) 0.025 % cream APPLY 1 APPLICATION TO AFFECTED AREA AT BEDTIME  . vitamin C (ASCORBIC ACID) 500 MG tablet Take 500 mg by mouth daily.   Past Medical History:  Diagnosis Date  . Anxiety   . Complication of anesthesia   . Depression    generalized anxiety disorder  . Diverticulosis   . Duodenal diverticulum   . Family history of adverse reaction to anesthesia    Mother and Daughters- N/V  .  Fibromyalgia   . Gastroesophageal reflux disease with hiatal hernia   . Hiatal hernia   . History of kidney stones   . Hyperlipidemia   . IBS (irritable bowel syndrome)   . Lymphocytic colitis    Dr Sharlett Iles  . Migraine headache   . Mitral valve prolapse   . Neuropathy   . Osteopenia    BMD ordered by GYN  . Peripheral neuropathy    treated as RLS by  Neurology  . PONV (postoperative nausea and vomiting)   . Restless leg syndrome   . Schatzki's ring    History of  . Sleep apnea    On CPAP, has not been using  . Spondylosis    Past Surgical History:  Procedure Laterality Date  . ANAL RECTAL MANOMETRY N/A 11/14/2015   Procedure: ANO RECTAL MANOMETRY;  Surgeon: Gatha Mayer, MD;  Location: [facility];  Service: Endoscopy;  Laterality: N/A;  . BREAST ENHANCEMENT SURGERY    . CATARACT EXTRACTION Bilateral   . cataract surgery Left    Dr Bing Plume  . CHOLECYSTECTOMY    . COLONOSCOPY    . ESOPHAGEAL DILATION     X 2  . ROTATOR CUFF REPAIR Left   . SINUS ENDO W/FUSION N/A 05/30/2014   Procedure: REVISION  FRONTAL SINUS SURGERY WITH FUSION SCAN;  Surgeon: Jerrell Belfast, MD;  Location: [place];  Service: ENT;  Laterality: N/A;  . SINUS SURGERY WITH INSTATRAK     x 2  . TUBAL LIGATION    . Vaginal cystectomy     x 2  . VAGINAL HYSTERECTOMY  05/2007   Vaginal repair, Dr Ubaldo Glassing.  Partial  hysterectomy.   Social History   Social History Narrative   HAS REGULAR EXERCISE   DAILY CAFFEINE: 1-2 CUPS   Patient is right handed.   family history includes Anemia in her mother; Coronary artery disease in her brother; Diabetes in her maternal uncle; Heart attack in her father and paternal uncle; Hypertension in her mother; Hypertension (age of onset: 43) in her father; Neuropathy in her mother; Stroke (age of onset: 37) in her father.   Review of Systems As per HPI  Objective:   Physical Exam BP 90/60 (BP Location: Left Arm, Patient Position: Sitting, Cuff Size: Normal)   Pulse 68    Ht 5\' 7"  (1.702 m)   Wt 117 lb 6 oz (53.2 kg)   BMI 18.38 kg/m  Elderly white woman appearing somewhat chronically ill in no acute severe distress Assisted by Yazmyne Sara Best physician assistant for physical exam due to my hand injuries the patient's abdominal exam is soft mildly tender in the lower abdomen   Rectal exam is performed by Jaclyn Shaggy, anal inspection witnessed by me shows a small posterior anal fissure, she  has some swollen external hemorrhoids that are slightly violaceous, and she does not have an impaction and no significant tenderness

## 2019-12-06 ENCOUNTER — Telehealth: Payer: Self-pay | Admitting: Internal Medicine

## 2019-12-06 DIAGNOSIS — R197 Diarrhea, unspecified: Secondary | ICD-10-CM

## 2019-12-06 DIAGNOSIS — K921 Melena: Secondary | ICD-10-CM

## 2019-12-06 NOTE — Telephone Encounter (Signed)
I spoke with [name] and she did the purge this AM and has only had colored water come out, no stool. Last night she went to the bathroom a lot and it was only brownish water, no solids stool. She doesn't have a fever. She is very hungry , per Dr Carlean Purl she may eat the egg sandwich she wants and he will talk to her tomorrow.

## 2019-12-07 ENCOUNTER — Encounter: Payer: Self-pay | Admitting: Gastroenterology

## 2019-12-07 ENCOUNTER — Encounter: Payer: Self-pay | Admitting: Internal Medicine

## 2019-12-07 MED ORDER — CLENPIQ 10-3.5-12 MG-GM -GM/160ML PO SOLN: 1.0000 | ORAL | 0 refills | Status: DC

## 2019-12-07 MED ORDER — DIPHENOXYLATE-ATROPINE 2.5-0.025 MG PO TABS: 1.0000 | ORAL_TABLET | Freq: Four times a day (QID) | ORAL | 0 refills | Status: DC | PRN

## 2019-12-07 NOTE — Telephone Encounter (Signed)
I spoke with husband [name] and he is going to come by today after work to pick up the colonoscopy instructions for [name]'s colon appointment on 12/09/2019 at 10:30AM.

## 2019-12-07 NOTE — Telephone Encounter (Signed)
I just spoke to husband [name] at [phone] and he wanted to know the game plan. [name] is weak he said, passing out on him. She ate the egg sandwich and it ran right thru her last night.

## 2019-12-07 NOTE — Telephone Encounter (Signed)
Please advise Sir, thank you. 

## 2019-12-07 NOTE — Telephone Encounter (Signed)
diarhea, pt sick, wants to discuss a plan for her care

## 2019-12-07 NOTE — Telephone Encounter (Signed)
I spoke to the patient and husband [name] Know his phone number is [phone] I updated it in demographics  The patient said her diarrhea may be a little bit better but she is diffusely weak and afraid to eat because everything runs through her  It does not sound like the MiraLAX purge has resolved things.  Her electrolytes are normal  She told me she stopped her Celexa and alprazolam several days ago maybe a week ago cold [place].  That may be why she feels weaker than before.  My advice is as follows:  Restart Celexa and alprazolam Try to eat solid food broiled grilled etc. and increase intake  Prescription for Lomotil 1 every 4 hours as needed sent in electronically  We will schedule a colonoscopy to evaluate diarrhea  Please try to arrange with Dr. Lyndel Safe for October 8 he has multiple openings  Patient has been vaccinated for Covid per husband  She will need to start clear liquids tomorrow morning or by lunchtime depending upon timing of her colonoscopy  We'll either need previsit or phone call instructions if we can pull this off

## 2019-12-07 NOTE — Telephone Encounter (Signed)
error 

## 2019-12-07 NOTE — Telephone Encounter (Signed)
I have gone over the instructions in detail for the upcoming colonoscopy with [name] her husband.

## 2019-12-08 ENCOUNTER — Encounter: Payer: Self-pay | Admitting: Certified Registered Nurse Anesthetist

## 2019-12-08 ENCOUNTER — Telehealth: Payer: Self-pay | Admitting: Gastroenterology

## 2019-12-08 NOTE — Telephone Encounter (Signed)
Patients husband calling in reference to patient patient doing the prep and having diarrhea seeking advise on medication

## 2019-12-08 NOTE — Telephone Encounter (Signed)
I spoke with husband [name] and questions about prepping were answered.

## 2019-12-09 ENCOUNTER — Telehealth: Payer: Self-pay | Admitting: Gastroenterology

## 2019-12-09 ENCOUNTER — Other Ambulatory Visit: Payer: Self-pay

## 2019-12-09 ENCOUNTER — Other Ambulatory Visit: Payer: Self-pay | Admitting: Internal Medicine

## 2019-12-09 ENCOUNTER — Ambulatory Visit (AMBULATORY_SURGERY_CENTER): Payer: 59 | Admitting: Gastroenterology

## 2019-12-09 ENCOUNTER — Encounter: Payer: Self-pay | Admitting: Gastroenterology

## 2019-12-09 VITALS — BP 134/80 | HR 62 | Temp 97.9°F | Resp 19 | Ht 67.0 in | Wt 117.0 lb

## 2019-12-09 DIAGNOSIS — K649 Unspecified hemorrhoids: Secondary | ICD-10-CM

## 2019-12-09 DIAGNOSIS — D122 Benign neoplasm of ascending colon: Secondary | ICD-10-CM

## 2019-12-09 DIAGNOSIS — K52832 Lymphocytic colitis: Secondary | ICD-10-CM

## 2019-12-09 DIAGNOSIS — D123 Benign neoplasm of transverse colon: Secondary | ICD-10-CM

## 2019-12-09 DIAGNOSIS — R197 Diarrhea, unspecified: Secondary | ICD-10-CM

## 2019-12-09 DIAGNOSIS — K5730 Diverticulosis of large intestine without perforation or abscess without bleeding: Secondary | ICD-10-CM

## 2019-12-09 MED ORDER — SODIUM CHLORIDE 0.9 % IV SOLN: 500.0000 mL | Freq: Once | INTRAVENOUS | Status: DC

## 2019-12-09 NOTE — Progress Notes (Addendum)
AR - Check-in  CW - VS  Pt has a fractures left hip -pt reported "several breaks" June 2021.  She is aware that she will be laying on her left side for the colonoscopy .  I have an extras pillow to place in between her legs. Lafe Garin, CRNA was made aware.  MAW   Pt has a walker.  I placed her name on the walker and Judson Roch Monday, RN took walker to the recovery room. maw

## 2019-12-09 NOTE — Progress Notes (Signed)
Called to room to assist during endoscopic procedure.  Patient ID and intended procedure confirmed with present staff. Received instructions for my participation in the procedure from the performing physician.  

## 2019-12-09 NOTE — Progress Notes (Signed)
1035 Robinul 0.2 mg IV given due large amount of secretions upon assessment.  MD made aware, vss  

## 2019-12-09 NOTE — Op Note (Signed)
[facility] Patient Name: [name] Procedure Date: 12/09/2019 10:24 AM MRN: [id number] Endoscopist: Jackquline Denmark , MD Age: 74 Referring MD:  Date of Birth: [birth year] Gender: Female Account #: [id number] Procedure:                Colonoscopy Indications:              Clinically significant diarrhea of unexplained                            origin. H/O lymphocytic colitis. Medicines:                Monitored Anesthesia Care Procedure:                Pre-Anesthesia Assessment:                           - Prior to the procedure, a History and Physical                            was performed, and patient medications and                            allergies were reviewed. The patient's tolerance of                            previous anesthesia was also reviewed. The risks                            and benefits of the procedure and the sedation                            options and risks were discussed with the patient.                            All questions were answered, and informed consent                            was obtained. Prior Anticoagulants: The patient has                            taken no previous anticoagulant or antiplatelet                            agents. ASA Grade Assessment: II - A patient with                            mild systemic disease. After reviewing the risks                            and benefits, the patient was deemed in                            satisfactory condition to undergo the procedure.  After obtaining informed consent, the colonoscope                            was passed under direct vision. Throughout the                            procedure, the patient's blood pressure, pulse, and                            oxygen saturations were monitored continuously. The                            Colonoscope was introduced through the anus and                            advanced to the 2 cm  into the ileum. The                            colonoscopy was somewhat difficult due to a                            tortuous colon. Successful completion of the                            procedure was aided by applying abdominal pressure.                            The patient tolerated the procedure well. The                            quality of the bowel preparation was good. The                            terminal ileum, ileocecal valve, appendiceal                            orifice, and rectum were photographed. Scope In: 10:27:37 AM Scope Out: 11:00:14 AM Scope Withdrawal Time: 0 hours 18 minutes 59 seconds  Total Procedure Duration: 0 hours 32 minutes 37 seconds  Findings:                 The colon (entire examined portion) appeared normal                            with well preserved vascular pattern. There was                            mild edema especially in the left colon without                            colitis (?Importance). Adherent mucus was noted                            throughout the colon. Biopsies for histology were  taken with a cold forceps from the right colon and                            left colon. These were sent in separate jars.                           Three sessile polyps were found in the proximal                            transverse colon, mid transverse colon and distal                            ascending colon. The polyps were 2 to 4 mm in size.                            These polyps were removed with a cold biopsy                            forceps. Resection and retrieval were complete.                           Multiple small-mouthed diverticula were found in                            the sigmoid colon. No SCAD.                           Non-bleeding internal hemorrhoids were found during                            retroflexion. The hemorrhoids were small.                           The terminal ileum appeared  normal. Biopsies were                            taken with a cold forceps for histology.                           The exam was otherwise without abnormality on                            direct and retroflexion views. Complications:            No immediate complications. Estimated Blood Loss:     Estimated blood loss: none. Impression:               -Small colonic polyps s/p polypectomy.                           -Moderate sigmoid diverticulosis.                           -Otherwise normal colonoscopy to TI.                           -  S/P random colonic and TI biopsies. Recommendation:           - Patient has a contact number available for                            emergencies. The signs and symptoms of potential                            delayed complications were discussed with the                            patient. Return to normal activities tomorrow.                            Written discharge instructions were provided to the                            patient.                           - Resume previous diet.                           - Continue present medications.                           - Await pathology results.                           - Return to GI clinic in 4 weeks with Dr Carlean Purl or                            APP clinic..                           - The findings and recommendations were discussed                            with the patient's family. Jackquline Denmark, MD 12/09/2019 11:11:35 AM This report has been signed electronically.

## 2019-12-09 NOTE — Progress Notes (Signed)
Report given to PACU, vss 

## 2019-12-09 NOTE — Patient Instructions (Signed)
3 small polyps removed and sent to pathology. F/U office appointment in 4 weeks.  The office will call to schedule.    Resume previous medications.  Await pathology for final recommendations.  Handouts on findings given to patient.     YOU HAD AN ENDOSCOPIC PROCEDURE TODAY AT [facility]:   Refer to the procedure report that was given to you for any specific questions about what was found during the examination.  If the procedure report does not answer your questions, please call your gastroenterologist to clarify.  If you requested that your care partner not be given the details of your procedure findings, then the procedure report has been included in a sealed envelope for you to review at your convenience later.  YOU SHOULD EXPECT: Some feelings of bloating in the abdomen. Passage of more gas than usual.  Walking can help get rid of the air that was put into your GI tract during the procedure and reduce the bloating. If you had a lower endoscopy (such as a colonoscopy or flexible sigmoidoscopy) you may notice spotting of blood in your stool or on the toilet paper. If you underwent a bowel prep for your procedure, you may not have a normal bowel movement for a few days.  Please Note:  You might notice some irritation and congestion in your nose or some drainage.  This is from the oxygen used during your procedure.  There is no need for concern and it should clear up in a day or so.  SYMPTOMS TO REPORT IMMEDIATELY:   Following lower endoscopy (colonoscopy or flexible sigmoidoscopy):  Excessive amounts of blood in the stool  Significant tenderness or worsening of abdominal pains  Swelling of the abdomen that is new, acute  Fever of 100F or higher    For urgent or emergent issues, a gastroenterologist can be reached at any hour by calling [phone]. Do not use MyChart messaging for urgent concerns.    DIET:  We do recommend a small meal at first, but then you may  proceed to your regular diet.  Drink plenty of fluids but you should avoid alcoholic beverages for 24 hours.  ACTIVITY:  You should plan to take it easy for the rest of today and you should NOT DRIVE or use heavy machinery until tomorrow (because of the sedation medicines used during the test).    FOLLOW UP: Our staff will call the number listed on your records 48-72 hours following your procedure to check on you and address any questions or concerns that you may have regarding the information given to you following your procedure. If we do not reach you, we will leave a message.  We will attempt to reach you two times.  During this call, we will ask if you have developed any symptoms of COVID 19. If you develop any symptoms (ie: fever, flu-like symptoms, shortness of breath, cough etc.) before then, please call [phone].  If you test positive for Covid 19 in the 2 weeks post procedure, please call and report this information to [place].    If any biopsies were taken you will be contacted by phone or by letter within the next 1-3 weeks.  Please call us at [phone] if you have not heard about the biopsies in 3 weeks.    SIGNATURES/CONFIDENTIALITY: You and/or your care partner have signed paperwork which will be entered into your electronic medical record.  These signatures attest to the fact that that the information above  on your After Visit Summary has been reviewed and is understood.  Full responsibility of the confidentiality of this discharge information lies with you and/or your care-partner.

## 2019-12-09 NOTE — Telephone Encounter (Signed)
Pt's spouse  Is requesting a call back from a nruse to discuss clarification on which medication the pt should be taking.

## 2019-12-12 NOTE — Telephone Encounter (Signed)
I was unable to ready [name] (husband). I spoke with Madaline Savage and she did not have any medicine questions. She does however want to switch to Dr Lyndel Safe and she also said she did not receive any papers when she left here last week after her procedure. I will check into that for her.

## 2019-12-13 ENCOUNTER — Telehealth: Payer: Self-pay

## 2019-12-13 NOTE — Telephone Encounter (Signed)
I reviewed the records we are waiting on biopsies there are no new medications yet to the best of my knowledge  If she wants to switch that is okay but that is entirely up to Dr. Lyndel Safe.  The patient should be aware of the location of his office as well.  She lives in [place].  I would clarify with [name] her husband also.

## 2019-12-13 NOTE — Telephone Encounter (Signed)
Hi [name], I just did the colon. She is in excellent hands with Dr Carlean Purl. Truly, I do not think I can help her as much as he did.  My recommendation, respectfully, would be to continue excellent care with him. RG

## 2019-12-13 NOTE — Telephone Encounter (Signed)
  Follow up Call-  Call back number 12/09/2019  Post procedure Call Back phone  # [phone] cell  Permission to leave phone message Yes  Some recent data might be hidden     Patient questions:  Do you have a fever, pain , or abdominal swelling? No. Pain Score  0 *  Have you tolerated food without any problems? Yes.    Have you been able to return to your normal activities? Yes.    Do you have any questions about your discharge instructions: Diet   No. Medications  No. Follow up visit  No.  Do you have questions or concerns about your Care? No.  Actions: * If pain score is 4 or above: No action needed, pain <4.  1. Have you developed a fever since your procedure? No   2.   Have you had an respiratory symptoms (SOB or cough) since your procedure? No   3.   Have you tested positive for COVID 19 since your procedure? No   4.   Have you had any family members/close contacts diagnosed with the COVID 19 since your procedure?  No    If yes to any of these questions please route to Joylene John, RN and Joella Prince, RN

## 2019-12-13 NOTE — Telephone Encounter (Signed)
Continue care with me Dr. Lyndel Safe not accepting

## 2019-12-13 NOTE — Telephone Encounter (Signed)
[name] called in and wanted to let us know that his wife is still having diarrhea post colonoscopy. He said he is giving her the lomotil. He wanted to make sure that was okay. I told him yes that Dr Lyndel Safe said in his report to continue current medicines. He said his wife felt very comfortable with Dr Lyndel Safe and that she does want to switch to him if he will accept her. I will mail them another copy of the procedure report as they cannot find theirs.

## 2019-12-15 NOTE — Telephone Encounter (Signed)
Patient and her husband have been informed. An appointment has been made for 01/16/2020 at 3:30 with Dr Carlean Purl for f/u. While on the phone with them they report that she is still having lots of diarrhea, yesterday she went 12-13 times. She is taking the lomotil on a regular schedule now instead of prn to see if that helps. They are asking if she can increase the dosage? She is taking one every 6 hours.

## 2019-12-16 MED ORDER — BUDESONIDE 3 MG PO CPEP: 9.0000 mg | ORAL_CAPSULE | ORAL | 1 refills | Status: DC

## 2019-12-16 MED ORDER — DIPHENOXYLATE-ATROPINE 2.5-0.025 MG PO TABS: 2.0000 | ORAL_TABLET | Freq: Four times a day (QID) | ORAL | 0 refills | Status: DC | PRN

## 2019-12-16 NOTE — Telephone Encounter (Signed)
Spoke to patient and explained the colon biopsies showed persistent lymphocytic colitis  I neglected to mention that the polyps were precancerous but benign  Plan is to start budesonide 9 mg daily I sent prescription in  She may take 2 Lomotil generic at a time to help with diarrhea  She has follow-up with me November 15

## 2019-12-18 NOTE — Telephone Encounter (Signed)
Thanks for letting me know. RG   [name],  No recall d/t age No need for letter  RG

## 2019-12-25 NOTE — Patient Instructions (Addendum)
For your sleep try calm forte or something similar.  Make your mammogram and bone density appointments.  Have your eyes examined.    Blood work was ordered.    All other Health Maintenance issues reviewed.   All recommended immunizations and age-appropriate screenings are up-to-date or discussed.  Flu immunization administered today.    Medications reviewed and updated.  Changes include :   Stop citalopram and start remeron at bedtime  Your prescription(s) have been submitted to your pharmacy. Please take as directed and contact our office if you believe you are having problem(s) with the medication(s).   Please followup in  3 weeks   Health Maintenance, Female Adopting a healthy lifestyle and getting preventive care are important in promoting health and wellness. Ask your health care provider about:  The right schedule for you to have regular tests and exams.  Things you can do on your own to prevent diseases and keep yourself healthy. What should I know about diet, weight, and exercise? Eat a healthy diet   Eat a diet that includes plenty of vegetables, fruits, low-fat dairy products, and lean protein.  Do not eat a lot of foods that are high in solid fats, added sugars, or sodium. Maintain a healthy weight Body mass index (BMI) is used to identify weight problems. It estimates body fat based on height and weight. Your health care provider can help determine your BMI and help you achieve or maintain a healthy weight. Get regular exercise Get regular exercise. This is one of the most important things you can do for your health. Most adults should:  Exercise for at least 150 minutes each week. The exercise should increase your heart rate and make you sweat (moderate-intensity exercise).  Do strengthening exercises at least twice a week. This is in addition to the moderate-intensity exercise.  Spend less time sitting. Even light physical activity can be beneficial. Watch  cholesterol and blood lipids Have your blood tested for lipids and cholesterol at 76 years of age, then have this test every 5 years. Have your cholesterol levels checked more often if:  Your lipid or cholesterol levels are high.  You are older than 76 years of age.  You are at high risk for heart disease. What should I know about cancer screening? Depending on your health history and family history, you may need to have cancer screening at various ages. This may include screening for:  Breast cancer.  Cervical cancer.  Colorectal cancer.  Skin cancer.  Lung cancer. What should I know about heart disease, diabetes, and high blood pressure? Blood pressure and heart disease  High blood pressure causes heart disease and increases the risk of stroke. This is more likely to develop in people who have high blood pressure readings, are of African descent, or are overweight.  Have your blood pressure checked: ? Every 3-5 years if you are 8-12 years of age. ? Every year if you are 33 years old or older. Diabetes Have regular diabetes screenings. This checks your fasting blood sugar level. Have the screening done:  Once every three years after age 15 if you are at a normal weight and have a low risk for diabetes.  More often and at a younger age if you are overweight or have a high risk for diabetes. What should I know about preventing infection? Hepatitis B If you have a higher risk for hepatitis B, you should be screened for this virus. Talk with your health care provider to find  out if you are at risk for hepatitis B infection. Hepatitis C Testing is recommended for:  Everyone born from [birth year] through 1967.  Anyone with known risk factors for hepatitis C. Sexually transmitted infections (STIs)  Get screened for STIs, including gonorrhea and chlamydia, if: ? You are sexually active and are younger than 76 years of age. ? You are older than 76 years of age and your health care  provider tells you that you are at risk for this type of infection. ? Your sexual activity has changed since you were last screened, and you are at increased risk for chlamydia or gonorrhea. Ask your health care provider if you are at risk.  Ask your health care provider about whether you are at high risk for HIV. Your health care provider may recommend a prescription medicine to help prevent HIV infection. If you choose to take medicine to prevent HIV, you should first get tested for HIV. You should then be tested every 3 months for as long as you are taking the medicine. Pregnancy  If you are about to stop having your period (premenopausal) and you may become pregnant, seek counseling before you get pregnant.  Take 400 to 800 micrograms (mcg) of folic acid every day if you become pregnant.  Ask for birth control (contraception) if you want to prevent pregnancy. Osteoporosis and menopause Osteoporosis is a disease in which the bones lose minerals and strength with aging. This can result in bone fractures. If you are 18 years old or older, or if you are at risk for osteoporosis and fractures, ask your health care provider if you should:  Be screened for bone loss.  Take a calcium or vitamin D supplement to lower your risk of fractures.  Be given hormone replacement therapy (HRT) to treat symptoms of menopause. Follow these instructions at home: Lifestyle  Do not use any products that contain nicotine or tobacco, such as cigarettes, e-cigarettes, and chewing tobacco. If you need help quitting, ask your health care provider.  Do not use street drugs.  Do not share needles.  Ask your health care provider for help if you need support or information about quitting drugs. Alcohol use  Do not drink alcohol if: ? Your health care provider tells you not to drink. ? You are pregnant, may be pregnant, or are planning to become pregnant.  If you drink alcohol: ? Limit how much you use to 0-1  drink a day. ? Limit intake if you are breastfeeding.  Be aware of how much alcohol is in your drink. In the [place] one drink equals one 12 oz bottle of beer (355 mL), one 5 oz glass of wine (148 mL), or one 1 oz glass of hard liquor (44 mL). General instructions  Schedule regular health, dental, and eye exams.  Stay current with your vaccines.  Tell your health care provider if: ? You often feel depressed. ? You have ever been abused or do not feel safe at home. Summary  Adopting a healthy lifestyle and getting preventive care are important in promoting health and wellness.  Follow your health care provider's instructions about healthy diet, exercising, and getting tested or screened for diseases.  Follow your health care provider's instructions on monitoring your cholesterol and blood pressure. This information is not intended to replace advice given to you by your health care provider. Make sure you discuss any questions you have with your health care provider. Document Revised: 02/10/2018 Document Reviewed: 02/10/2018 Elsevier Patient Education  2020 [employer]  

## 2019-12-25 NOTE — Progress Notes (Signed)
Subjective:    Patient ID: [name], female    DOB: May 21, 1945, 74 y.o.   MRN: [id number]   This visit occurred during the SARS-CoV-2 public health emergency.  Safety protocols were in place, including screening questions prior to the visit, additional usage of staff PPE, and extensive cleaning of exam room while observing appropriate contact time as indicated for disinfecting solutions.    HPI She is here for a physical exam.   Her grandson died 51 months ago.  She is still struggling with that.   She broke her hip in June.  She has done PT. she is still experiencing left hip pain.  She knows it will take a while.  She is no longer following with orthopedics.  She is having a lot of back pain.   She has lymphocytic colitis.  She had diarrhea, decreased appetite and has lost weight.  She is on budesonide.  She is doing better now.    She has not seen a therapist in a while.  She still feels depressed and anxious.   Medications and allergies reviewed with patient and updated if appropriate.  Patient Active Problem List   Diagnosis Date Noted  . Nausea 09/16/2019  . S/P ORIF (open reduction internal fixation) fracture, left hip 09/16/2019  . Hair loss 07/21/2019  . Weight loss 07/21/2019  . Grief 03/25/2019  . DDD (degenerative disc disease), cervical 09/24/2018  . Pain in left knee 03/17/2018  . Conductive hearing loss, bilateral 03/10/2018  . Prediabetes 12/17/2017  . Neck pain 04/28/2017  . Dizziness 02/26/2017  . B12 deficiency 12/16/2016  . Chest discomfort 05/07/2016  . Back pain 05/07/2016  . Asthma 04/22/2016  . Family history of diabetes mellitus (DM) 12/07/2015  . Constipation due to outlet dysfunction   . Obstructive sleep apnea 07/12/2015  . Seasonal and perennial allergic rhinitis 07/12/2015  . Vitamin D deficiency 12/05/2014  . Rhinitis, chronic 05/30/2014  . Vaginal atrophy 10/20/2012  . Insomnia 10/20/2012  . Orthostatic  hypotension 02/16/2012  . Cough 02/13/2011  . Palpitations 12/31/2010  . GERD (gastroesophageal reflux disease) 07/25/2010  . Lymphocytic colitis 06/14/2010  . Depression   . BACK PAIN, CHRONIC 12/27/2009  . Osteoporosis 12/27/2009  . SOB (shortness of breath) 06/25/2009  . Hereditary and idiopathic peripheral neuropathy 12/22/2007  . Hyperlipidemia 03/09/2007  . Generalized anxiety disorder 03/09/2007  . MIGRAINE HEADACHE 03/09/2007  . IBS 03/09/2007  . SPONDYLOSIS 03/09/2007  . Myalgia and myositis 03/09/2007  . SCHATZKI'S RING, HX OF 03/09/2007  . Diaphragmatic hernia 12/30/2006  . GASTRITIS 02/17/2005  . DIVERTICULOSIS OF COLON 01/01/2000    Current Outpatient Medications on File Prior to Visit  Medication Sig Dispense Refill  . AMBULATORY NON FORMULARY MEDICATION Bone 2 tablets twice a day    . Azelastine-Fluticasone (DYMISTA) 137-50 MCG/ACT SUSP Place 2 sprays into the nose daily.    . bimatoprost (LATISSE) 0.03 % ophthalmic solution Place one drop on applicator and apply evenly along the skin of the eyebrows once daily at bedtime 3 mL 3  . budesonide (ENTOCORT EC) 3 MG 24 hr capsule Take 3 capsules (9 mg total) by mouth every morning. 90 capsule 1  . busPIRone (BUSPAR) 10 MG tablet TAKE 1 TABLET BY MOUTH THREE TIMES A DAY 90 tablet 3  . CALCIUM-VITAMIN D PO Take 1 tablet by mouth daily.    . Cholecalciferol (VITAMIN D3) 125 MCG (5000 UT) CAPS TAKE 1 CAPSULE BY MOUTH EVERY DAY 90 capsule 1  .  citalopram (CELEXA) 20 MG tablet Take 1 tablet (20 mg total) by mouth daily. 90 tablet 1  . clonazePAM (KLONOPIN) 0.5 MG tablet TAKE 1 TABLET BY MOUTH TWICE A DAY 60 tablet 0  . Cyanocobalamin (VITAMIN B-12 PO) Take 1 tablet by mouth daily.    . cycloSPORINE (RESTASIS) 0.05 % ophthalmic emulsion Restasis 0.05 % eye drops in a dropperette    . dicyclomine (BENTYL) 10 MG capsule Take 10 mg by mouth every 6 (six) hours as needed for spasms.    . diphenoxylate-atropine (LOMOTIL) 2.5-0.025  MG tablet Take 2 tablets by mouth 4 (four) times daily as needed for diarrhea or loose stools. 90 tablet 0  . ondansetron (ZOFRAN ODT) 4 MG disintegrating tablet Take 1 tablet (4 mg total) by mouth every 8 (eight) hours as needed for nausea or vomiting. 30 tablet 1  . pantoprazole (PROTONIX) 40 MG tablet TAKE 1 TABLET BY MOUTH DAILY AS NEEDED 30 tablet 1  . Probiotic Product (ALIGN) 4 MG CAPS Take 1 capsule by mouth as needed.     . Sod Picosulfate-Mag Ox-Cit Acd (CLENPIQ) 10-3.5-12 MG-GM -GM/160ML SOLN Clenpiq 10 mg-3.5 gram-12 gram/160 mL oral solution    . tretinoin (RETIN-A) 0.025 % cream APPLY 1 APPLICATION TO AFFECTED AREA AT BEDTIME  99  . vitamin C (ASCORBIC ACID) 500 MG tablet Take 500 mg by mouth daily.    . [DISCONTINUED] colesevelam (WELCHOL) 625 MG tablet Take 625 mg by mouth 2 (two) times daily with a meal. Take one tablet by mouth twice a day    . [DISCONTINUED] Hyoscyamine-Phenyltoloxamine ([place] NF) [id number] MG CAPS Take one tablet by mouth as needed for abdominal cramping 24 each 0   No current facility-administered medications on file prior to visit.    Past Medical History:  Diagnosis Date  . Allergy   . Anemia   . Anxiety   . Asthma   . Cataract    bil cateracts removed  . Complication of anesthesia   . Depression    generalized anxiety disorder  . Diverticulosis   . Duodenal diverticulum   . Family history of adverse reaction to anesthesia    Mother and Daughters- N/V  . Fibromyalgia   . Gastroesophageal reflux disease with hiatal hernia   . Hiatal hernia   . History of kidney stones   . Hyperlipidemia   . IBS (irritable bowel syndrome)   . Lymphocytic colitis    Dr Sharlett Iles  . Migraine headache   . Mitral valve prolapse   . Neuropathy   . Osteopenia    BMD ordered by GYN  . Osteoporosis   . Peripheral neuropathy    treated as RLS by  Neurology  . PONV (postoperative nausea and vomiting)   . Restless leg syndrome   . Schatzki's ring    History of   . Sleep apnea    On CPAP, has not been using  . Spondylosis     Past Surgical History:  Procedure Laterality Date  . ANAL RECTAL MANOMETRY N/A 11/14/2015   Procedure: ANO RECTAL MANOMETRY;  Surgeon: Gatha Mayer, MD;  Location: [facility];  Service: Endoscopy;  Laterality: N/A;  . BREAST ENHANCEMENT SURGERY    . CATARACT EXTRACTION Bilateral   . cataract surgery Left    Dr Bing Plume  . CHOLECYSTECTOMY    . COLONOSCOPY    . ESOPHAGEAL DILATION     X 2  . ROTATOR CUFF REPAIR Left   . SINUS ENDO W/FUSION N/A 05/30/2014  Procedure: REVISION  FRONTAL SINUS SURGERY WITH FUSION SCAN;  Surgeon: Jerrell Belfast, MD;  Location: [place];  Service: ENT;  Laterality: N/A;  . SINUS SURGERY WITH INSTATRAK     x 2  . TUBAL LIGATION    . UPPER GASTROINTESTINAL ENDOSCOPY    . Vaginal cystectomy     x 2  . VAGINAL HYSTERECTOMY  05/2007   Vaginal repair, Dr Ubaldo Glassing.  Partial  hysterectomy.    Social History   Socioeconomic History  . Marital status: Married    Spouse name: Not on file  . Number of children: 2  . Years of education: 81  . Highest education level: Not on file  Occupational History  . Occupation: retired    Fish farm manager: [employer]  Tobacco Use  . Smoking status: Former Smoker    Packs/day: 0.50    Years: 15.00    Pack years: 7.50    Types: Cigarettes    Quit date: 03/03/1998    Years since quitting: 21.8  . Smokeless tobacco: Never Used  . Tobacco comment: smoked 1973- 2006, up to <  1  ppd  Vaping Use  . Vaping Use: Never used  Substance and Sexual Activity  . Alcohol use: No    Alcohol/week: 0.0 standard drinks  . Drug use: No  . Sexual activity: Yes    Partners: Male    Comment: 1st intercourse- 67, partners- 2, married- 2 yrs   Other Topics Concern  . Not on file  Social History Narrative   HAS REGULAR EXERCISE   DAILY CAFFEINE: 1-2 CUPS   Patient is right handed.   Social Determinants of Health   Financial Resource Strain:   . Difficulty of Paying Living  Expenses: Not on file  Food Insecurity:   . Worried About Charity fundraiser in the Last Year: Not on file  . Ran Out of Food in the Last Year: Not on file  Transportation Needs:   . Lack of Transportation (Medical): Not on file  . Lack of Transportation (Non-Medical): Not on file  Physical Activity:   . Days of Exercise per Week: Not on file  . Minutes of Exercise per Session: Not on file  Stress:   . Feeling of Stress : Not on file  Social Connections:   . Frequency of Communication with Friends and Family: Not on file  . Frequency of Social Gatherings with Friends and Family: Not on file  . Attends Religious Services: Not on file  . Active Member of Clubs or Organizations: Not on file  . Attends Archivist Meetings: Not on file  . Marital Status: Not on file    Family History  Problem Relation Age of Onset  . Hypertension Father 3  . Stroke Father 52  . Heart attack Father         ? in 19s  . Hypertension Mother   . Neuropathy Mother   . Anemia Mother   . Coronary artery disease Brother        Stent placement in 60s  . Diabetes Maternal Uncle   . Heart attack Paternal [place]el [place] , ? age  . Colon cancer Neg Hx   . Seizures Neg Hx   . Esophageal cancer Neg Hx   . Rectal cancer Neg Hx   . Stomach cancer Neg Hx     Review of Systems  Constitutional: Negative for chills and fever.  Eyes: Negative for visual  disturbance.  Respiratory: Negative for cough, shortness of breath and wheezing.   Cardiovascular: Positive for palpitations (anxiety related). Negative for chest pain (anxiety related) and leg swelling.  Gastrointestinal: Positive for nausea. Negative for abdominal pain, blood in stool, constipation and diarrhea.       No gerd  Genitourinary: Negative for dysuria and hematuria.  Musculoskeletal: Positive for arthralgias and back pain.  Skin: Negative for rash.  Neurological: Positive for light-headedness (sometimes) and headaches (occ sinus).    Psychiatric/Behavioral: Positive for dysphoric mood. The patient is nervous/anxious.        Objective:   Vitals:   12/26/19 0953  BP: 110/70  Pulse: 89  Temp: 97.7 F (36.5 C)  SpO2: 96%   Filed Weights   12/26/19 0953  Weight: 115 lb (52.2 kg)   Body mass index is 18.01 kg/m.  BP Readings from Last 3 Encounters:  12/26/19 110/70  12/09/19 134/80  12/05/19 90/60    Wt Readings from Last 3 Encounters:  12/26/19 115 lb (52.2 kg)  12/09/19 117 lb (53.1 kg)  12/05/19 117 lb 6 oz (53.2 kg)     Physical Exam Constitutional: She appears slightly cachectic. No distress.  HENT:  Head: Normocephalic and atraumatic.  Right Ear: External ear normal. Normal ear canal and TM Left Ear: External ear normal.  Normal ear canal and TM Mouth/Throat: Oropharynx is clear and moist.  Eyes: Conjunctivae and EOM are normal.  Neck: Neck supple. No tracheal deviation present. No thyromegaly present.  No carotid bruit  Cardiovascular: Normal rate, regular rhythm and normal heart sounds.   No murmur heard.  No edema. Pulmonary/Chest: Effort normal and breath sounds normal. No respiratory distress. She has no wheezes. She has no rales.  Breast: deferred   Abdominal: Soft. She exhibits no distension. There is no tenderness.  Lymphadenopathy: She has no cervical adenopathy.  Skin: Skin is warm and dry. She is not diaphoretic.  Psychiatric: She has a depressed mood and affect. Her behavior is normal.        Assessment & Plan:   Physical exam: Screening blood work    ordered Immunizations  Flu vaccine today, discussed covid booster Colonoscopy  Up to date  Mammogram  Due - will schedule Gyn  N/a  Dexa  Due - will do at [facility] - ordered Eye exams  Will schedule Exercise  none Weight  Low weight  - chronic Substance abuse  none      See Problem List for Assessment and Plan of chronic medical problems.

## 2019-12-26 ENCOUNTER — Other Ambulatory Visit: Payer: Self-pay

## 2019-12-26 ENCOUNTER — Encounter: Payer: Self-pay | Admitting: Internal Medicine

## 2019-12-26 ENCOUNTER — Ambulatory Visit (INDEPENDENT_AMBULATORY_CARE_PROVIDER_SITE_OTHER): Payer: 59 | Admitting: Internal Medicine

## 2019-12-26 VITALS — BP 110/70 | HR 89 | Temp 97.7°F | Ht 67.0 in | Wt 115.0 lb

## 2019-12-26 DIAGNOSIS — K52832 Lymphocytic colitis: Secondary | ICD-10-CM

## 2019-12-26 DIAGNOSIS — Z23 Encounter for immunization: Secondary | ICD-10-CM | POA: Diagnosis not present

## 2019-12-26 DIAGNOSIS — K219 Gastro-esophageal reflux disease without esophagitis: Secondary | ICD-10-CM

## 2019-12-26 DIAGNOSIS — F411 Generalized anxiety disorder: Secondary | ICD-10-CM

## 2019-12-26 DIAGNOSIS — M810 Age-related osteoporosis without current pathological fracture: Secondary | ICD-10-CM

## 2019-12-26 DIAGNOSIS — E782 Mixed hyperlipidemia: Secondary | ICD-10-CM

## 2019-12-26 DIAGNOSIS — F5104 Psychophysiologic insomnia: Secondary | ICD-10-CM

## 2019-12-26 DIAGNOSIS — Z0000 Encounter for general adult medical examination without abnormal findings: Secondary | ICD-10-CM

## 2019-12-26 DIAGNOSIS — E559 Vitamin D deficiency, unspecified: Secondary | ICD-10-CM

## 2019-12-26 DIAGNOSIS — Z0001 Encounter for general adult medical examination with abnormal findings: Secondary | ICD-10-CM

## 2019-12-26 DIAGNOSIS — E440 Moderate protein-calorie malnutrition: Secondary | ICD-10-CM | POA: Insufficient documentation

## 2019-12-26 DIAGNOSIS — F3289 Other specified depressive episodes: Secondary | ICD-10-CM | POA: Diagnosis not present

## 2019-12-26 DIAGNOSIS — R7303 Prediabetes: Secondary | ICD-10-CM

## 2019-12-26 LAB — CBC WITH DIFFERENTIAL/PLATELET
Basophils Absolute: 0.1 10*3/uL (ref 0.0–0.1)
Basophils Relative: 1.1 % (ref 0.0–3.0)
Eosinophils Absolute: 0.1 10*3/uL (ref 0.0–0.7)
Eosinophils Relative: 0.9 % (ref 0.0–5.0)
HCT: 40.9 % (ref 36.0–46.0)
Hemoglobin: 13.6 g/dL (ref 12.0–15.0)
Lymphocytes Relative: 16.5 % (ref 12.0–46.0)
Lymphs Abs: 1.3 10*3/uL (ref 0.7–4.0)
MCHC: 33.3 g/dL (ref 30.0–36.0)
MCV: 86.3 fl (ref 78.0–100.0)
Monocytes Absolute: 0.6 10*3/uL (ref 0.1–1.0)
Monocytes Relative: 7.1 % (ref 3.0–12.0)
Neutro Abs: 5.8 10*3/uL (ref 1.4–7.7)
Neutrophils Relative %: 74.4 % (ref 43.0–77.0)
Platelets: 302 10*3/uL (ref 150.0–400.0)
RBC: 4.74 Mil/uL (ref 3.87–5.11)
RDW: 15.3 % (ref 11.5–15.5)
WBC: 7.9 10*3/uL (ref 4.0–10.5)

## 2019-12-26 LAB — HEMOGLOBIN A1C: Hgb A1c MFr Bld: 6.4 % (ref 4.6–6.5)

## 2019-12-26 LAB — LIPID PANEL
Cholesterol: 231 mg/dL — ABNORMAL HIGH (ref 0–200)
HDL: 70.8 mg/dL (ref >39.00)
LDL Cholesterol: 139 mg/dL — ABNORMAL HIGH (ref 0–99)
NonHDL: 160.21
Total CHOL/HDL Ratio: 3
Triglycerides: 106 mg/dL (ref 0.0–149.0)
VLDL: 21.2 mg/dL (ref 0.0–40.0)

## 2019-12-26 LAB — COMPREHENSIVE METABOLIC PANEL
ALT: 11 U/L (ref 0–35)
AST: 17 U/L (ref 0–37)
Albumin: 4.6 g/dL (ref 3.5–5.2)
Alkaline Phosphatase: 74 U/L (ref 39–117)
BUN: 15 mg/dL (ref 6–23)
CO2: 29 mEq/L (ref 19–32)
Calcium: 10.3 mg/dL (ref 8.4–10.5)
Chloride: 102 mEq/L (ref 96–112)
Creatinine, Ser: 0.56 mg/dL (ref 0.40–1.20)
GFR: 89.01 mL/min (ref >60.00)
Glucose, Bld: 87 mg/dL (ref 70–99)
Potassium: 5.1 mEq/L (ref 3.5–5.1)
Sodium: 139 mEq/L (ref 135–145)
Total Bilirubin: 0.5 mg/dL (ref 0.2–1.2)
Total Protein: 7.3 g/dL (ref 6.0–8.3)

## 2019-12-26 LAB — VITAMIN D 25 HYDROXY (VIT D DEFICIENCY, FRACTURES): VITD: 62.87 ng/mL (ref 30.00–100.00)

## 2019-12-26 LAB — TSH: TSH: 3.13 u[IU]/mL (ref 0.35–4.50)

## 2019-12-26 MED ORDER — MIRTAZAPINE 15 MG PO TABS: 15.0000 mg | ORAL_TABLET | Freq: Every day | ORAL | 3 refills | Status: DC

## 2019-12-26 NOTE — Assessment & Plan Note (Signed)
Chronic BMI 18.01 Discussed nutrition at length - start ensure between meals.  Increase protein.  Increase veges, fruits.    Has f/u in 3 weeks - recheck weight then

## 2019-12-26 NOTE — Assessment & Plan Note (Signed)
Chronic Check a1c Low sugar / carb diet Stressed regular exercise  

## 2019-12-26 NOTE — Assessment & Plan Note (Signed)
Chronic Check lipid panel  Diet controlled Regular exercise when able healthy diet encouraged

## 2019-12-26 NOTE — Assessment & Plan Note (Signed)
Chronic Not ideally controlled We will discontinue citalopram and start Remeron 15 mg at night, which will hopefully help with her sleep as well Follow-up in 3 weeks-we will titrate dose if tolerated Discussed possibly seeing a psychiatrist for further medication help-deferred at this time Stressed getting back with her therapist

## 2019-12-26 NOTE — Assessment & Plan Note (Signed)
Chronic Currently not on any treatment DEXA due-ordered Taking calcium and vitamin D Not exercising much Had traumatic left hip fracture in [place] recovering Will advise follow-up with Dr. Cruzita Lederer after DEXA done to discuss treatment options

## 2019-12-26 NOTE — Assessment & Plan Note (Signed)
Chronic Not ideally controlled Currently taking clonazepam 0.5 mg twice daily-continue Taking buspirone 10 mg 3 times daily-continue Citalopram not helping enough so we will discontinue Start Remeron 15 mg at bedtime Deferred referral to psychiatry at this time Advised seeing the therapist again

## 2019-12-26 NOTE — Assessment & Plan Note (Signed)
Chronic Following with GI On budesonide and symptoms are controlled-currently no diarrhea

## 2019-12-26 NOTE — Assessment & Plan Note (Signed)
Chronic Has not been sleeping well, likely related to depression and anxiety We will start Remeron 15 mg at bedtime

## 2019-12-26 NOTE — Assessment & Plan Note (Signed)
Chronic Intermittent GERD Takes pantoprazole 40 mg daily as needed-continue

## 2019-12-26 NOTE — Assessment & Plan Note (Signed)
Chronic Taking vitamin d daily Check vitamin d level  

## 2020-01-07 ENCOUNTER — Other Ambulatory Visit: Payer: Self-pay | Admitting: Internal Medicine

## 2020-01-15 NOTE — Progress Notes (Signed)
Subjective:    Patient ID: [name], female    DOB: Apr 30, 1945, 74 y.o.   MRN: [id number]  HPI The patient is here for follow up of their chronic medical problems, including anxiety, grief and depression. Three weeks ago we citalopram and started remeron.   Her sleep is a little better, but still not great.  She wakes up a lot.  She is tried throughout the day.  She had had some panic attacks.  She feels anxious.  She is depressed - she is not sure if it is different.   She has not gone back to her therapist.   Medications and allergies reviewed with patient and updated if appropriate.  Patient Active Problem List   Diagnosis Date Noted  . Moderate protein malnutrition ([place]) 12/26/2019  . Nausea 09/16/2019  . S/P ORIF (open reduction internal fixation) fracture, left hip 09/16/2019  . Hair loss 07/21/2019  . Weight loss 07/21/2019  . Grief 03/25/2019  . DDD (degenerative disc disease), cervical 09/24/2018  . Pain in left knee 03/17/2018  . Conductive hearing loss, bilateral 03/10/2018  . Prediabetes 12/17/2017  . Neck pain 04/28/2017  . Dizziness 02/26/2017  . B12 deficiency 12/16/2016  . Chest discomfort 05/07/2016  . Back pain 05/07/2016  . Asthma 04/22/2016  . Family history of diabetes mellitus (DM) 12/07/2015  . Constipation due to outlet dysfunction   . Obstructive sleep apnea 07/12/2015  . Seasonal and perennial allergic rhinitis 07/12/2015  . Vitamin D deficiency 12/05/2014  . Rhinitis, chronic 05/30/2014  . Vaginal atrophy 10/20/2012  . Insomnia 10/20/2012  . Orthostatic hypotension 02/16/2012  . Cough 02/13/2011  . Palpitations 12/31/2010  . GERD (gastroesophageal reflux disease) 07/25/2010  . Lymphocytic colitis 06/14/2010  . Depression   . BACK PAIN, CHRONIC 12/27/2009  . Osteoporosis 12/27/2009  . SOB (shortness of breath) 06/25/2009  . Hereditary and idiopathic peripheral neuropathy 12/22/2007  . Hyperlipidemia 03/09/2007  .  Generalized anxiety disorder 03/09/2007  . MIGRAINE HEADACHE 03/09/2007  . IBS 03/09/2007  . SPONDYLOSIS 03/09/2007  . SCHATZKI'S RING, HX OF 03/09/2007  . Diaphragmatic hernia 12/30/2006  . GASTRITIS 02/17/2005  . DIVERTICULOSIS OF COLON 01/01/2000    Current Outpatient Medications on File Prior to Visit  Medication Sig Dispense Refill  . AMBULATORY NON FORMULARY MEDICATION Bone 2 tablets twice a day    . Azelastine-Fluticasone (DYMISTA) 137-50 MCG/ACT SUSP Place 2 sprays into the nose daily.    . bimatoprost (LATISSE) 0.03 % ophthalmic solution Place one drop on applicator and apply evenly along the skin of the eyebrows once daily at bedtime 3 mL 3  . budesonide (ENTOCORT EC) 3 MG 24 hr capsule Take 3 capsules (9 mg total) by mouth every morning. 90 capsule 1  . busPIRone (BUSPAR) 10 MG tablet TAKE 1 TABLET BY MOUTH THREE TIMES A DAY 90 tablet 3  . CALCIUM-VITAMIN D PO Take 1 tablet by mouth daily.    . Cholecalciferol (VITAMIN D3) 125 MCG (5000 UT) CAPS TAKE 1 CAPSULE BY MOUTH EVERY DAY 90 capsule 1  . clonazePAM (KLONOPIN) 0.5 MG tablet TAKE 1 TABLET BY MOUTH TWICE A DAY 60 tablet 0  . Cyanocobalamin (VITAMIN B-12 PO) Take 1 tablet by mouth daily.    . cycloSPORINE (RESTASIS) 0.05 % ophthalmic emulsion Restasis 0.05 % eye drops in a dropperette    . dicyclomine (BENTYL) 10 MG capsule Take 10 mg by mouth every 6 (six) hours as needed for spasms.    . diphenoxylate-atropine (LOMOTIL) 2.5-0.025  MG tablet Take 2 tablets by mouth 4 (four) times daily as needed for diarrhea or loose stools. 90 tablet 0  . midodrine (PROAMATINE) 10 MG tablet     . ondansetron (ZOFRAN ODT) 4 MG disintegrating tablet Take 1 tablet (4 mg total) by mouth every 8 (eight) hours as needed for nausea or vomiting. 30 tablet 1  . pantoprazole (PROTONIX) 40 MG tablet TAKE 1 TABLET BY MOUTH DAILY AS NEEDED 30 tablet 1  . Probiotic Product (ALIGN) 4 MG CAPS Take 1 capsule by mouth as needed.     . tretinoin (RETIN-A)  0.025 % cream APPLY 1 APPLICATION TO AFFECTED AREA AT BEDTIME  99  . vitamin C (ASCORBIC ACID) 500 MG tablet Take 500 mg by mouth daily.    . [DISCONTINUED] mirtazapine (REMERON) 15 MG tablet Take 1 tablet (15 mg total) by mouth at bedtime. 30 tablet 3  . Sod Picosulfate-Mag Ox-Cit Acd (CLENPIQ) 10-3.5-12 MG-GM -GM/160ML SOLN Clenpiq 10 mg-3.5 gram-12 gram/160 mL oral solution (Patient not taking: Reported on 01/16/2020)    . [DISCONTINUED] colesevelam (WELCHOL) 625 MG tablet Take 625 mg by mouth 2 (two) times daily with a meal. Take one tablet by mouth twice a day    . [DISCONTINUED] Hyoscyamine-Phenyltoloxamine ([place] NF) [id number] MG CAPS Take one tablet by mouth as needed for abdominal cramping 24 each 0   No current facility-administered medications on file prior to visit.    Past Medical History:  Diagnosis Date  . Allergy   . Anemia   . Anxiety   . Asthma   . Cataract    bil cateracts removed  . Complication of anesthesia   . Depression    generalized anxiety disorder  . Diverticulosis   . Duodenal diverticulum   . Family history of adverse reaction to anesthesia    Mother and Daughters- N/V  . Fibromyalgia   . Gastroesophageal reflux disease with hiatal hernia   . Hiatal hernia   . History of kidney stones   . Hyperlipidemia   . IBS (irritable bowel syndrome)   . Lymphocytic colitis    Dr Sharlett Iles  . Migraine headache   . Mitral valve prolapse   . Neuropathy   . Osteopenia    BMD ordered by GYN  . Osteoporosis   . Peripheral neuropathy    treated as RLS by  Neurology  . PONV (postoperative nausea and vomiting)   . Restless leg syndrome   . Schatzki's ring    History of  . Sleep apnea    On CPAP, has not been using  . Spondylosis     Past Surgical History:  Procedure Laterality Date  . ANAL RECTAL MANOMETRY N/A 11/14/2015   Procedure: ANO RECTAL MANOMETRY;  Surgeon: Gatha Mayer, MD;  Location: [facility];  Service: Endoscopy;  Laterality: N/A;  .  BREAST ENHANCEMENT SURGERY    . CATARACT EXTRACTION Bilateral   . cataract surgery Left    Dr Bing Plume  . CHOLECYSTECTOMY    . COLONOSCOPY    . ESOPHAGEAL DILATION     X 2  . ROTATOR CUFF REPAIR Left   . SINUS ENDO W/FUSION N/A 05/30/2014   Procedure: REVISION  FRONTAL SINUS SURGERY WITH FUSION SCAN;  Surgeon: Jerrell Belfast, MD;  Location: [place];  Service: ENT;  Laterality: N/A;  . SINUS SURGERY WITH INSTATRAK     x 2  . TUBAL LIGATION    . UPPER GASTROINTESTINAL ENDOSCOPY    . Vaginal cystectomy  x 2  . VAGINAL HYSTERECTOMY  05/2007   Vaginal repair, Dr Ubaldo Glassing.  Partial  hysterectomy.    Social History   Socioeconomic History  . Marital status: Married    Spouse name: Not on file  . Number of children: 2  . Years of education: 47  . Highest education level: Not on file  Occupational History  . Occupation: retired    Fish farm manager: [employer]  Tobacco Use  . Smoking status: Former Smoker    Packs/day: 0.50    Years: 15.00    Pack years: 7.50    Types: Cigarettes    Quit date: 03/03/1998    Years since quitting: 21.8  . Smokeless tobacco: Never Used  . Tobacco comment: smoked 1973- 2006, up to <  1  ppd  Vaping Use  . Vaping Use: Never used  Substance and Sexual Activity  . Alcohol use: No    Alcohol/week: 0.0 standard drinks  . Drug use: No  . Sexual activity: Yes    Partners: Male    Comment: 1st intercourse- 68, partners- 2, married- 42 yrs   Other Topics Concern  . Not on file  Social History Narrative   HAS REGULAR EXERCISE   DAILY CAFFEINE: 1-2 CUPS   Patient is right handed.   Social Determinants of Health   Financial Resource Strain:   . Difficulty of Paying Living Expenses: Not on file  Food Insecurity:   . Worried About Charity fundraiser in the Last Year: Not on file  . Ran Out of Food in the Last Year: Not on file  Transportation Needs:   . Lack of Transportation (Medical): Not on file  . Lack of Transportation (Non-Medical): Not on file  Physical  Activity:   . Days of Exercise per Week: Not on file  . Minutes of Exercise per Session: Not on file  Stress:   . Feeling of Stress : Not on file  Social Connections:   . Frequency of Communication with Friends and Family: Not on file  . Frequency of Social Gatherings with Friends and Family: Not on file  . Attends Religious Services: Not on file  . Active Member of Clubs or Organizations: Not on file  . Attends Archivist Meetings: Not on file  . Marital Status: Not on file    Family History  Problem Relation Age of Onset  . Hypertension Father 28  . Stroke Father 32  . Heart attack Father         ? in 51s  . Hypertension Mother   . Neuropathy Mother   . Anemia Mother   . Coronary artery disease Brother        Stent placement in 60s  . Diabetes Maternal Uncle   . Heart attack Paternal [place]el [place] , ? age  . Colon cancer Neg Hx   . Seizures Neg Hx   . Esophageal cancer Neg Hx   . Rectal cancer Neg Hx   . Stomach cancer Neg Hx     Review of Systems  Constitutional: Positive for appetite change (better - still has to force herself to eat).  Psychiatric/Behavioral: Positive for decreased concentration. The patient is nervous/anxious.        Objective:   Vitals:   01/16/20 1350  BP: 112/72  Pulse: 60  Temp: 98.2 F (36.8 C)  SpO2: 95%   BP Readings from Last 3 Encounters:  01/16/20 112/72  12/26/19 110/70  12/09/19 134/80   Wt Readings from Last 3 Encounters:  01/16/20 120 lb (54.4 kg)  12/26/19 115 lb (52.2 kg)  12/09/19 117 lb (53.1 kg)   Body mass index is 18.79 kg/m.   Physical Exam    Constitutional: Appears well-developed and well-nourished. No distress.  HENT:  Head: Normocephalic and atraumatic.  Neck: Neck supple. No tracheal deviation present. No thyromegaly present.  No cervical lymphadenopathy Cardiovascular: Normal rate, regular rhythm and normal heart sounds.   No murmur heard. No carotid bruit .  No  edema Pulmonary/Chest: Effort normal and breath sounds normal. No respiratory distress. No has no wheezes. No rales.  Skin: Skin is warm and dry. Not diaphoretic.  Psychiatric: depressed mood and affect. Behavior is normal.      Assessment & Plan:    See Problem List for Assessment and Plan of chronic medical problems.    This visit occurred during the SARS-CoV-2 public health emergency.  Safety protocols were in place, including screening questions prior to the visit, additional usage of staff PPE, and extensive cleaning of exam room while observing appropriate contact time as indicated for disinfecting solutions.

## 2020-01-16 ENCOUNTER — Encounter: Payer: Self-pay | Admitting: Internal Medicine

## 2020-01-16 ENCOUNTER — Ambulatory Visit: Payer: 59 | Admitting: Internal Medicine

## 2020-01-16 ENCOUNTER — Other Ambulatory Visit: Payer: Self-pay

## 2020-01-16 VITALS — BP 112/72 | HR 60 | Temp 98.2°F | Ht 67.0 in | Wt 120.0 lb

## 2020-01-16 VITALS — BP 118/60 | HR 60 | Ht 67.0 in | Wt 121.0 lb

## 2020-01-16 DIAGNOSIS — F411 Generalized anxiety disorder: Secondary | ICD-10-CM

## 2020-01-16 DIAGNOSIS — K52832 Lymphocytic colitis: Secondary | ICD-10-CM

## 2020-01-16 DIAGNOSIS — F3289 Other specified depressive episodes: Secondary | ICD-10-CM

## 2020-01-16 DIAGNOSIS — K582 Mixed irritable bowel syndrome: Secondary | ICD-10-CM | POA: Diagnosis not present

## 2020-01-16 DIAGNOSIS — F4321 Adjustment disorder with depressed mood: Secondary | ICD-10-CM | POA: Diagnosis not present

## 2020-01-16 MED ORDER — BUSPIRONE HCL 10 MG PO TABS
10.0000 mg | ORAL_TABLET | Freq: Three times a day (TID) | ORAL | 3 refills | Status: DC
Start: 2020-01-16 — End: 2020-04-27

## 2020-01-16 MED ORDER — MIRTAZAPINE 30 MG PO TABS: 30.0000 mg | ORAL_TABLET | Freq: Every day | ORAL | 1 refills | Status: DC

## 2020-01-16 MED ORDER — VITAMIN D3 125 MCG (5000 UT) PO CAPS
ORAL_CAPSULE | ORAL | 0 refills | Status: DC
Start: 2020-01-16 — End: 2020-05-21

## 2020-01-16 NOTE — Assessment & Plan Note (Signed)
Still having difficulty with grandson's death experiencing anxiety and depression, which she had prior to his death but are worse now Stressed seeing her therapist - she needs help with this Increase remeron at night to 30 mg for depression, anxiety and poor sleep - appetite improving

## 2020-01-16 NOTE — Patient Instructions (Addendum)
If you are age 76 or older, your body mass index should be between 23-30. Your Body mass index is 18.95 kg/m. If this is out of the aforementioned range listed, please consider follow up with your Primary Care Provider.   Please continue taking the stool softener.  Please purchase the following medication over the counter and take as directed:      Metamucil 1/2 teaspoon twice a day, may increase to 1 tablespoon twice a day if                needed.  Stop the Budesonide.  Follow up with [place] as needed.   It was great seeing you today!  Thank you for entrusting me with your care and choosing [facility].  Dr. Carlean Purl

## 2020-01-16 NOTE — Progress Notes (Signed)
[name] 74 y.o. 01-29-1946 [id number]  Assessment & Plan:   Encounter Diagnoses  Name Primary?  . Irritable bowel syndrome with both constipation and diarrhea Yes  . Lymphocytic colitis     Underlying disorder really seems to be mixed IBS Pelvic PT likely to help but she decline Reinstitute Metamucil 1 tsp bid - return titrate up to 1 tbsp bid F/u prn Dc budesonide  NO:BSJGG, Claudina Lick, MD   Subjective:   Chief Complaint: constipation  HPI 74 yo ww w/ IBS who has struggled with diarrhea lately - I had though possible overflow diarrhea given hx constipation IBS but treatment measures did not resolve so colonoscopy was performed and random bxs showed lymphocytic colitis - budesonide was started and diarrhe soon stopped. She is now moving bowels most days but they are large and hard and lead to straining. She is taking a stool softener but has not restarted her metamucil previously taken 1 tsp bid. Slow recovery from hip fracture surgery has been frustrating her.  Allergies  Allergen Reactions  . Adhesive [Tape] Rash  . Albuterol     heartburn  . Cymbalta [Duloxetine Hcl]     Diarrhea, nausea, anxiety worse, shaky  . Latex Other (See Comments)  . Lyrica [Pregabalin] Other (See Comments)    Sedation  . Neurontin [Gabapentin] Other (See Comments)    Sedation  . Nitrofuran Derivatives Other (See Comments)    "tingling"  . Paxil [Paroxetine Hcl]     Bowel upset, tingling  . Prozac [Fluoxetine Hcl] Other (See Comments)    Made patient worse and constipation   Current Meds  Medication Sig  . AMBULATORY NON FORMULARY MEDICATION Bone 2 tablets twice a day  . Azelastine-Fluticasone (DYMISTA) 137-50 MCG/ACT SUSP Place 2 sprays into the nose daily.  . bimatoprost (LATISSE) 0.03 % ophthalmic solution Place one drop on applicator and apply evenly along the skin of the eyebrows once daily at bedtime  . budesonide (ENTOCORT EC) 3 MG 24 hr capsule Take 3  capsules (9 mg total) by mouth every morning.  . busPIRone (BUSPAR) 10 MG tablet Take 1 tablet (10 mg total) by mouth 3 (three) times daily.  [name] CALCIUM-VITAMIN D PO Take 1 tablet by mouth daily.  . Cholecalciferol (VITAMIN D3) 125 MCG (5000 UT) CAPS TAKE 1 CAPSULE BY MOUTH EVERY DAY  . clonazePAM (KLONOPIN) 0.5 MG tablet TAKE 1 TABLET BY MOUTH TWICE A DAY  . Cyanocobalamin (VITAMIN B-12 PO) Take 1 tablet by mouth daily.  . cycloSPORINE (RESTASIS) 0.05 % ophthalmic emulsion Restasis 0.05 % eye drops in a dropperette  . midodrine (PROAMATINE) 10 MG tablet   . mirtazapine (REMERON) 30 MG tablet Take 1 tablet (30 mg total) by mouth at bedtime.  . ondansetron (ZOFRAN ODT) 4 MG disintegrating tablet Take 1 tablet (4 mg total) by mouth every 8 (eight) hours as needed for nausea or vomiting.  . pantoprazole (PROTONIX) 40 MG tablet TAKE 1 TABLET BY MOUTH DAILY AS NEEDED  . Probiotic Product (ALIGN) 4 MG CAPS Take 1 capsule by mouth as needed.   . Sod Picosulfate-Mag Ox-Cit Acd (CLENPIQ) 10-3.5-12 MG-GM -GM/160ML SOLN Clenpiq 10 mg-3.5 gram-12 gram/160 mL oral solution  . tretinoin (RETIN-A) 0.025 % cream APPLY 1 APPLICATION TO AFFECTED AREA AT BEDTIME  . vitamin C (ASCORBIC ACID) 500 MG tablet Take 500 mg by mouth daily.   Past Medical History:  Diagnosis Date  . Allergy   . Anemia   . Anxiety   . Asthma   .  Cataract    bil cateracts removed  . Complication of anesthesia   . Depression    generalized anxiety disorder  . Diverticulosis   . Duodenal diverticulum   . Family history of adverse reaction to anesthesia    Mother and Daughters- N/V  . Fibromyalgia   . Gastroesophageal reflux disease with hiatal hernia   . Hiatal hernia   . History of kidney stones   . Hyperlipidemia   . IBS (irritable bowel syndrome)   . Lymphocytic colitis    Dr Sharlett Iles  . Migraine headache   . Mitral valve prolapse   . Neuropathy   . Osteopenia    BMD ordered by GYN  . Osteoporosis   . Peripheral  neuropathy    treated as RLS by  Neurology  . PONV (postoperative nausea and vomiting)   . Restless leg syndrome   . Schatzki's ring    History of  . Sleep apnea    On CPAP, has not been using  . Spondylosis    Past Surgical History:  Procedure Laterality Date  . ANAL RECTAL MANOMETRY N/A 11/14/2015   Procedure: ANO RECTAL MANOMETRY;  Surgeon: Gatha Mayer, MD;  Location: [facility];  Service: Endoscopy;  Laterality: N/A;  . BREAST ENHANCEMENT SURGERY    . CATARACT EXTRACTION Bilateral   . cataract surgery Left    Dr Bing Plume  . CHOLECYSTECTOMY    . COLONOSCOPY    . ESOPHAGEAL DILATION     X 2  . ROTATOR CUFF REPAIR Left   . SINUS ENDO W/FUSION N/A 05/30/2014   Procedure: REVISION  FRONTAL SINUS SURGERY WITH FUSION SCAN;  Surgeon: Jerrell Belfast, MD;  Location: [place];  Service: ENT;  Laterality: N/A;  . SINUS SURGERY WITH INSTATRAK     x 2  . TUBAL LIGATION    . UPPER GASTROINTESTINAL ENDOSCOPY    . Vaginal cystectomy     x 2  . VAGINAL HYSTERECTOMY  05/2007   Vaginal repair, Dr Ubaldo Glassing.  Partial  hysterectomy.   Social History   Social History Narrative   HAS REGULAR EXERCISE   DAILY CAFFEINE: 1-2 CUPS   Patient is right handed.   family history includes Anemia in her mother; Coronary artery disease in her brother; Diabetes in her maternal uncle; Heart attack in her father and paternal uncle; Hypertension in her mother; Hypertension (age of onset: 56) in her father; Neuropathy in her mother; Stroke (age of onset: 87) in her father.   Review of Systems As above  Objective:   Physical Exam BP 118/60   Pulse 60   Ht 5\' 7"  (1.702 m)   Wt 121 lb (54.9 kg)   SpO2 95%   BMI 18.95 kg/m

## 2020-01-16 NOTE — Assessment & Plan Note (Signed)
Chronic Still with anxiety - this is chronic, but is worse since her grandson died Stressed getting back into seeing her therapist Continue clonazepam 0.5 mg BID, buspar 10 mg TID Increase remeron to 30 mg nightly

## 2020-01-16 NOTE — Patient Instructions (Addendum)
Increase the mirtazapine to 30 mg nightly.    Continue all your other medications.     Follow up with your therapist   Follow up in 3 months, sooner if needed

## 2020-01-16 NOTE — Assessment & Plan Note (Signed)
Chronic Not controlled Increase remeron to 30 mg nightly Advised seeing her therapist regularly

## 2020-01-23 ENCOUNTER — Telehealth: Payer: Self-pay | Admitting: Internal Medicine

## 2020-01-23 DIAGNOSIS — F5104 Psychophysiologic insomnia: Secondary | ICD-10-CM

## 2020-01-23 MED ORDER — RAMELTEON 8 MG PO TABS: 8.0000 mg | ORAL_TABLET | Freq: Every day | ORAL | 2 refills | Status: DC

## 2020-01-23 NOTE — Telephone Encounter (Signed)
mirtazapine (REMERON) 30 MG tablet Patient states she is not doing well on this medication, patient states she was sleeping a little better, but it was making her have crazy dreams and it is also making her anxiety and depression worse so she was wondering if it can be changed to something else. Patient # [phone]

## 2020-01-23 NOTE — Telephone Encounter (Signed)
Top mirtazapine (remeron)   Try new rx - rozerem - sent to [facility].  If this does not work let us know and we will try something different.

## 2020-01-24 NOTE — Telephone Encounter (Signed)
Tried to reach patient but voicemail not set up to leave a message.

## 2020-02-03 ENCOUNTER — Ambulatory Visit: Payer: 59 | Attending: Internal Medicine

## 2020-02-03 DIAGNOSIS — Z23 Encounter for immunization: Secondary | ICD-10-CM

## 2020-02-03 NOTE — Progress Notes (Signed)
   Covid-19 Vaccination Clinic  Name:  [name] [facility]    MRN: [id number] DOB: [birth year]  02/03/2020  [name] was observed post Covid-19 immunization for 15 minutes without incident. She was provided with Vaccine Information Sheet and instruction to access the V-Safe system.   [name] was instructed to call 911 with any severe reactions post vaccine: [name] Difficulty breathing  . Swelling of face and throat  . A fast heartbeat  . A bad rash all over body  . Dizziness and weakness   Immunizations Administered    Name Date Dose VIS Date Route   Pfizer COVID-19 Vaccine 02/03/2020  3:51 PM 0.3 mL 12/21/2019 Intramuscular   Manufacturer: [place]   Lot: [id number]   NDC: [address]-5

## 2020-02-05 NOTE — Progress Notes (Signed)
4\   Subjective:    Patient ID: [name], female    DOB: [birth year], 74 y.o.   MRN: [id number]  HPI The patient is here to discuss her medications.   The roxerem is not helping with sleep.  She   Medications and allergies reviewed with patient and updated if appropriate.  Patient Active Problem List   Diagnosis Date Noted  . Moderate protein malnutrition ([place]) 12/26/2019  . Nausea 09/16/2019  . S/P ORIF (open reduction internal fixation) fracture, left hip 09/16/2019  . Hair loss 07/21/2019  . Weight loss 07/21/2019  . Grief 03/25/2019  . DDD (degenerative disc disease), cervical 09/24/2018  . Pain in left knee 03/17/2018  . Conductive hearing loss, bilateral 03/10/2018  . Prediabetes 12/17/2017  . Neck pain 04/28/2017  . Dizziness 02/26/2017  . B12 deficiency 12/16/2016  . Chest discomfort 05/07/2016  . Back pain 05/07/2016  . Asthma 04/22/2016  . Family history of diabetes mellitus (DM) 12/07/2015  . Constipation due to outlet dysfunction   . Obstructive sleep apnea 07/12/2015  . Seasonal and perennial allergic rhinitis 07/12/2015  . Vitamin D deficiency 12/05/2014  . Rhinitis, chronic 05/30/2014  . Vaginal atrophy 10/20/2012  . Insomnia 10/20/2012  . Orthostatic hypotension 02/16/2012  . Cough 02/13/2011  . Palpitations 12/31/2010  . GERD (gastroesophageal reflux disease) 07/25/2010  . Lymphocytic colitis 06/14/2010  . Depression   . BACK PAIN, CHRONIC 12/27/2009  . Osteoporosis 12/27/2009  . SOB (shortness of breath) 06/25/2009  . Hereditary and idiopathic peripheral neuropathy 12/22/2007  . Hyperlipidemia 03/09/2007  . Generalized anxiety disorder 03/09/2007  . MIGRAINE HEADACHE 03/09/2007  . IBS 03/09/2007  . SPONDYLOSIS 03/09/2007  . SCHATZKI'S RING, HX OF 03/09/2007  . Diaphragmatic hernia 12/30/2006  . GASTRITIS 02/17/2005  . DIVERTICULOSIS OF COLON 01/01/2000    Current Outpatient Medications on File Prior to Visit   Medication Sig Dispense Refill  . AMBULATORY NON FORMULARY MEDICATION Bone 2 tablets twice a day    . Azelastine-Fluticasone (DYMISTA) 137-50 MCG/ACT SUSP Place 2 sprays into the nose daily.    . bimatoprost (LATISSE) 0.03 % ophthalmic solution Place one drop on applicator and apply evenly along the skin of the eyebrows once daily at bedtime 3 mL 3  . busPIRone (BUSPAR) 10 MG tablet Take 1 tablet (10 mg total) by mouth 3 (three) times daily. 90 tablet 3  . CALCIUM-VITAMIN D PO Take 1 tablet by mouth daily.    . Cholecalciferol (VITAMIN D3) 125 MCG (5000 UT) CAPS TAKE 1 CAPSULE BY MOUTH EVERY DAY 90 capsule 0  . clonazePAM (KLONOPIN) 0.5 MG tablet TAKE 1 TABLET BY MOUTH TWICE A DAY 60 tablet 0  . Cyanocobalamin (VITAMIN B-12 PO) Take 1 tablet by mouth daily.    . cycloSPORINE (RESTASIS) 0.05 % ophthalmic emulsion Restasis 0.05 % eye drops in a dropperette    . midodrine (PROAMATINE) 10 MG tablet     . ondansetron (ZOFRAN ODT) 4 MG disintegrating tablet Take 1 tablet (4 mg total) by mouth every 8 (eight) hours as needed for nausea or vomiting. 30 tablet 1  . pantoprazole (PROTONIX) 40 MG tablet TAKE 1 TABLET BY MOUTH DAILY AS NEEDED 30 tablet 1  . Probiotic Product (ALIGN) 4 MG CAPS Take 1 capsule by mouth as needed.     . Sod Picosulfate-Mag Ox-Cit Acd (CLENPIQ) 10-3.5-12 MG-GM -GM/160ML SOLN Clenpiq 10 mg-3.5 gram-12 gram/160 mL oral solution    . tretinoin (RETIN-A) 0.025 % cream APPLY 1 APPLICATION TO AFFECTED AREA AT  BEDTIME  99  . vitamin C (ASCORBIC ACID) 500 MG tablet Take 500 mg by mouth daily.    . [DISCONTINUED] colesevelam (WELCHOL) 625 MG tablet Take 625 mg by mouth 2 (two) times daily with a meal. Take one tablet by mouth twice a day    . [DISCONTINUED] Hyoscyamine-Phenyltoloxamine ([place] NF) [id number] MG CAPS Take one tablet by mouth as needed for abdominal cramping 24 each 0   No current facility-administered medications on file prior to visit.    Past Medical History:   Diagnosis Date  . Allergy   . Anemia   . Anxiety   . Asthma   . Cataract    bil cateracts removed  . Complication of anesthesia   . Depression    generalized anxiety disorder  . Diverticulosis   . Duodenal diverticulum   . Family history of adverse reaction to anesthesia    Mother and Daughters- N/V  . Fibromyalgia   . Gastroesophageal reflux disease with hiatal hernia   . Hiatal hernia   . History of kidney stones   . Hyperlipidemia   . IBS (irritable bowel syndrome)   . Lymphocytic colitis    Dr Sharlett Iles  . Migraine headache   . Mitral valve prolapse   . Neuropathy   . Osteopenia    BMD ordered by GYN  . Osteoporosis   . Peripheral neuropathy    treated as RLS by  Neurology  . PONV (postoperative nausea and vomiting)   . Restless leg syndrome   . Schatzki's ring    History of  . Sleep apnea    On CPAP, has not been using  . Spondylosis     Past Surgical History:  Procedure Laterality Date  . ANAL RECTAL MANOMETRY N/A 11/14/2015   Procedure: ANO RECTAL MANOMETRY;  Surgeon: Gatha Mayer, MD;  Location: [facility];  Service: Endoscopy;  Laterality: N/A;  . BREAST ENHANCEMENT SURGERY    . CATARACT EXTRACTION Bilateral   . cataract surgery Left    Dr Bing Plume  . CHOLECYSTECTOMY    . COLONOSCOPY    . ESOPHAGEAL DILATION     X 2  . ROTATOR CUFF REPAIR Left   . SINUS ENDO W/FUSION N/A 05/30/2014   Procedure: REVISION  FRONTAL SINUS SURGERY WITH FUSION SCAN;  Surgeon: Jerrell Belfast, MD;  Location: [place];  Service: ENT;  Laterality: N/A;  . SINUS SURGERY WITH INSTATRAK     x 2  . TUBAL LIGATION    . UPPER GASTROINTESTINAL ENDOSCOPY    . Vaginal cystectomy     x 2  . VAGINAL HYSTERECTOMY  05/2007   Vaginal repair, Dr Ubaldo Glassing.  Partial  hysterectomy.    Social History   Socioeconomic History  . Marital status: Married    Spouse name: Not on file  . Number of children: 2  . Years of education: 18  . Highest education level: Not on file  Occupational  History  . Occupation: retired    Fish farm manager: [employer]  Tobacco Use  . Smoking status: Former Smoker    Packs/day: 0.50    Years: 15.00    Pack years: 7.50    Types: Cigarettes    Quit date: 03/03/1998    Years since quitting: 21.9  . Smokeless tobacco: Never Used  . Tobacco comment: smoked 1973- 2006, up to <  1  ppd  Vaping Use  . Vaping Use: Never used  Substance and Sexual Activity  . Alcohol use: No  Alcohol/week: 0.0 standard drinks  . Drug use: No  . Sexual activity: Yes    Partners: Male    Comment: 1st intercourse- 49, partners- 2, married- 52 yrs   Other Topics Concern  . Not on file  Social History Narrative   HAS REGULAR EXERCISE   DAILY CAFFEINE: 1-2 CUPS   Patient is right handed.   Social Determinants of Health   Financial Resource Strain:   . Difficulty of Paying Living Expenses: Not on file  Food Insecurity:   . Worried About Charity fundraiser in the Last Year: Not on file  . Ran Out of Food in the Last Year: Not on file  Transportation Needs:   . Lack of Transportation (Medical): Not on file  . Lack of Transportation (Non-Medical): Not on file  Physical Activity:   . Days of Exercise per Week: Not on file  . Minutes of Exercise per Session: Not on file  Stress:   . Feeling of Stress : Not on file  Social Connections:   . Frequency of Communication with Friends and Family: Not on file  . Frequency of Social Gatherings with Friends and Family: Not on file  . Attends Religious Services: Not on file  . Active Member of Clubs or Organizations: Not on file  . Attends Archivist Meetings: Not on file  . Marital Status: Not on file    Family History  Problem Relation Age of Onset  . Hypertension Father 70  . Stroke Father 14  . Heart attack Father         ? in 59s  . Hypertension Mother   . Neuropathy Mother   . Anemia Mother   . Coronary artery disease Brother        Stent placement in 60s  . Diabetes Maternal Uncle   . Heart  attack Paternal [place] , ? age  . Colon cancer Neg Hx   . Seizures Neg Hx   . Esophageal cancer Neg Hx   . Rectal cancer Neg Hx   . Stomach cancer Neg Hx     Review of Systems  Constitutional: Positive for fatigue. Negative for fever.  Respiratory: Positive for shortness of breath.   Cardiovascular: Positive for chest pain and palpitations.  Psychiatric/Behavioral: Positive for dysphoric mood and sleep disturbance. The patient is nervous/anxious (with panic attacks).        Objective:   Vitals:   02/06/20 1507  BP: 116/74  Pulse: 64  Resp: 16  Temp: 98.2 F (36.8 C)   BP Readings from Last 3 Encounters:  02/06/20 116/74  01/16/20 118/60  01/16/20 112/72   Wt Readings from Last 3 Encounters:  02/06/20 120 lb (54.4 kg)  01/16/20 121 lb (54.9 kg)  01/16/20 120 lb (54.4 kg)   Body mass index is 18.79 kg/m.   Physical Exam Constitutional:      General: She is not in acute distress.    Appearance: Normal appearance. She is not ill-appearing.  HENT:     Head: Normocephalic and atraumatic.  Skin:    General: Skin is warm and dry.  Neurological:     Mental Status: She is alert.  Psychiatric:        Behavior: Behavior normal.        Thought Content: Thought content normal.        Judgment: Judgment normal.     Comments: Depressed mood  Assessment & Plan:    See Problem List for Assessment and Plan of chronic medical problems.    This visit occurred during the SARS-CoV-2 public health emergency.  Safety protocols were in place, including screening questions prior to the visit, additional usage of staff PPE, and extensive cleaning of exam room while observing appropriate contact time as indicated for disinfecting solutions.

## 2020-02-06 ENCOUNTER — Other Ambulatory Visit: Payer: Self-pay

## 2020-02-06 ENCOUNTER — Ambulatory Visit: Payer: 59 | Admitting: Internal Medicine

## 2020-02-06 ENCOUNTER — Encounter: Payer: Self-pay | Admitting: Internal Medicine

## 2020-02-06 DIAGNOSIS — F5104 Psychophysiologic insomnia: Secondary | ICD-10-CM

## 2020-02-06 DIAGNOSIS — F411 Generalized anxiety disorder: Secondary | ICD-10-CM

## 2020-02-06 DIAGNOSIS — F3289 Other specified depressive episodes: Secondary | ICD-10-CM | POA: Diagnosis not present

## 2020-02-06 MED ORDER — QUETIAPINE FUMARATE 25 MG PO TABS: 25.0000 mg | ORAL_TABLET | Freq: Every day | ORAL | 5 refills | Status: DC

## 2020-02-06 MED ORDER — FLUOXETINE HCL 20 MG PO TABS: 20.0000 mg | ORAL_TABLET | Freq: Every day | ORAL | 5 refills | Status: DC

## 2020-02-06 NOTE — Assessment & Plan Note (Signed)
Chronic Not ideally controlled Continue Buspar 10mg  TID, clonazepam 0.5 mg BID Will retry prozac 20 mg daily F/u in 3 weeks

## 2020-02-06 NOTE — Assessment & Plan Note (Signed)
Chronic Not controlled Trazodone, rozerem, remeron not effective or not tolerated Will try seroquel 25 mg nightly given resistant anxiety Follow up in 3 weeks, call sooner with questions

## 2020-02-06 NOTE — Patient Instructions (Signed)
Start prozac 20 mg daily.   Start seroquel 25 mg nightly.      Continue all your other medications.     Follow up with me in about 3 weeks.  Call sooner with any questions or concerns.

## 2020-02-06 NOTE — Assessment & Plan Note (Addendum)
Chronic Not controlled Will to retry prozac - start prozac 20 mg daily Follow up in about 3 weeks

## 2020-02-07 ENCOUNTER — Ambulatory Visit: Payer: 59 | Admitting: Internal Medicine

## 2020-02-10 ENCOUNTER — Other Ambulatory Visit: Payer: Self-pay | Admitting: Internal Medicine

## 2020-02-15 ENCOUNTER — Other Ambulatory Visit: Payer: Self-pay | Admitting: Internal Medicine

## 2020-03-05 ENCOUNTER — Ambulatory Visit: Payer: 59 | Admitting: Internal Medicine

## 2020-03-06 ENCOUNTER — Ambulatory Visit: Payer: 59 | Admitting: Internal Medicine

## 2020-03-09 ENCOUNTER — Other Ambulatory Visit: Payer: Self-pay | Admitting: Internal Medicine

## 2020-04-09 ENCOUNTER — Other Ambulatory Visit: Payer: Self-pay | Admitting: Internal Medicine

## 2020-04-26 ENCOUNTER — Other Ambulatory Visit: Payer: Self-pay

## 2020-04-26 NOTE — Patient Instructions (Addendum)
  Blood work was ordered.     Medications changes include :   Increase seroquel to 50 mg nightly.   Increase fluoxetine (prozac) to 40 mg daily.     Your prescription(s) have been submitted to your pharmacy. Please take as directed and contact our office if you believe you are having problem(s) with the medication(s).    Please followup in 3 weeks

## 2020-04-26 NOTE — Progress Notes (Signed)
Subjective:    Patient ID: [name], female    DOB: Jun 12, 1945, 75 y.o.   MRN: [id number]  HPI The patient is here for follow up of their chronic medical problems, including anxiety, depression, insomnia, GERD, prediabetes   She is taking all of her medications as prescribed.     Her left hip ( the broken  - replaced one) is hurting and so is her lower back.  She has always had lower back issues.  She will have an xray and MRI to evaluate further. Her balance is not great due to her neuropathy - she has to be very careful.  She is exercising regularly.  She is doing some of her PT exercises at home.  She is following with ortho.   She is not sleeping.    She is having trouble with her memory.  Her concentration is bad.   She has no motivation. She does not want to do anything.  She does not cook or clean and used to do it all the time.  She is still depressed.  She feels jittering in her chest.  She is still anxious.     Medications and allergies reviewed with patient and updated if appropriate.  Patient Active Problem List   Diagnosis Date Noted  . Moderate protein malnutrition ([place]) 12/26/2019  . Nausea 09/16/2019  . S/P ORIF (open reduction internal fixation) fracture, left hip 09/16/2019  . Hair loss 07/21/2019  . Weight loss 07/21/2019  . Grief 03/25/2019  . DDD (degenerative disc disease), cervical 09/24/2018  . Pain in left knee 03/17/2018  . Conductive hearing loss, bilateral 03/10/2018  . Prediabetes 12/17/2017  . Neck pain 04/28/2017  . Dizziness 02/26/2017  . B12 deficiency 12/16/2016  . Chest discomfort 05/07/2016  . Back pain 05/07/2016  . Asthma 04/22/2016  . Family history of diabetes mellitus (DM) 12/07/2015  . Constipation due to outlet dysfunction   . Obstructive sleep apnea 07/12/2015  . Seasonal and perennial allergic rhinitis 07/12/2015  . Vitamin D deficiency 12/05/2014  . Rhinitis, chronic 05/30/2014  . Vaginal atrophy  10/20/2012  . Insomnia 10/20/2012  . Orthostatic hypotension 02/16/2012  . Cough 02/13/2011  . Palpitations 12/31/2010  . GERD (gastroesophageal reflux disease) 07/25/2010  . Lymphocytic colitis 06/14/2010  . Depression   . BACK PAIN, CHRONIC 12/27/2009  . Osteoporosis 12/27/2009  . SOB (shortness of breath) 06/25/2009  . Hereditary and idiopathic peripheral neuropathy 12/22/2007  . Hyperlipidemia 03/09/2007  . Generalized anxiety disorder 03/09/2007  . MIGRAINE HEADACHE 03/09/2007  . IBS 03/09/2007  . SPONDYLOSIS 03/09/2007  . SCHATZKI'S RING, HX OF 03/09/2007  . Diaphragmatic hernia 12/30/2006  . GASTRITIS 02/17/2005  . DIVERTICULOSIS OF COLON 01/01/2000    Current Outpatient Medications on File Prior to Visit  Medication Sig Dispense Refill  . AMBULATORY NON FORMULARY MEDICATION Bone 2 tablets twice a day    . Azelastine-Fluticasone 137-50 MCG/ACT SUSP Place 2 sprays into the nose daily.    . bimatoprost (LATISSE) 0.03 % ophthalmic solution Place one drop on applicator and apply evenly along the skin of the eyebrows once daily at bedtime 3 mL 3  . busPIRone (BUSPAR) 10 MG tablet Take 1 tablet (10 mg total) by mouth 3 (three) times daily. 90 tablet 3  . CALCIUM-VITAMIN D PO Take 1 tablet by mouth daily.    . Cholecalciferol (VITAMIN D3) 125 MCG (5000 UT) CAPS TAKE 1 CAPSULE BY MOUTH EVERY DAY 90 capsule 0  . clonazePAM (KLONOPIN) 0.5 MG  tablet TAKE 1 TABLET BY MOUTH TWICE A DAY 60 tablet 0  . Cyanocobalamin (VITAMIN B-12 PO) Take 1 tablet by mouth daily.    . cycloSPORINE (RESTASIS) 0.05 % ophthalmic emulsion Restasis 0.05 % eye drops in a dropperette    . FLUoxetine (PROZAC) 20 MG tablet Take 1 tablet (20 mg total) by mouth daily. 30 tablet 5  . ondansetron (ZOFRAN ODT) 4 MG disintegrating tablet Take 1 tablet (4 mg total) by mouth every 8 (eight) hours as needed for nausea or vomiting. 30 tablet 1  . pantoprazole (PROTONIX) 40 MG tablet TAKE 1 TABLET BY MOUTH DAILY AS NEEDED  30 tablet 1  . Probiotic Product (ALIGN) 4 MG CAPS Take 1 capsule by mouth as needed.    [name] QUEtiapine (SEROQUEL) 25 MG tablet Take 1 tablet (25 mg total) by mouth at bedtime. 30 tablet 5  . Sod Picosulfate-Mag Ox-Cit Acd (CLENPIQ) 10-3.5-12 MG-GM -GM/160ML SOLN Clenpiq 10 mg-3.5 gram-12 gram/160 mL oral solution    . tretinoin (RETIN-A) 0.025 % cream APPLY 1 APPLICATION TO AFFECTED AREA AT BEDTIME  99  . vitamin C (ASCORBIC ACID) 500 MG tablet Take 500 mg by mouth daily.    . midodrine (PROAMATINE) 10 MG tablet  (Patient not taking: Reported on 04/27/2020)    . mirtazapine (REMERON) 30 MG tablet Take 30 mg by mouth at bedtime.    . [DISCONTINUED] colesevelam (WELCHOL) 625 MG tablet Take 625 mg by mouth 2 (two) times daily with a meal. Take one tablet by mouth twice a day    . [DISCONTINUED] Hyoscyamine-Phenyltoloxamine ([place] NF) [id number] MG CAPS Take one tablet by mouth as needed for abdominal cramping 24 each 0   No current facility-administered medications on file prior to visit.    Past Medical History:  Diagnosis Date  . Allergy   . Anemia   . Anxiety   . Asthma   . Cataract    bil cateracts removed  . Complication of anesthesia   . Depression    generalized anxiety disorder  . Diverticulosis   . Duodenal diverticulum   . Family history of adverse reaction to anesthesia    Mother and Daughters- N/V  . Fibromyalgia   . Gastroesophageal reflux disease with hiatal hernia   . Hiatal hernia   . History of kidney stones   . Hyperlipidemia   . IBS (irritable bowel syndrome)   . Lymphocytic colitis    Dr Sharlett Iles  . Migraine headache   . Mitral valve prolapse   . Neuropathy   . Osteopenia    BMD ordered by GYN  . Osteoporosis   . Peripheral neuropathy    treated as RLS by  Neurology  . PONV (postoperative nausea and vomiting)   . Restless leg syndrome   . Schatzki's ring    History of  . Sleep apnea    On CPAP, has not been using  . Spondylosis     Past Surgical  History:  Procedure Laterality Date  . ANAL RECTAL MANOMETRY N/A 11/14/2015   Procedure: ANO RECTAL MANOMETRY;  Surgeon: Gatha Mayer, MD;  Location: [facility];  Service: Endoscopy;  Laterality: N/A;  . BREAST ENHANCEMENT SURGERY    . CATARACT EXTRACTION Bilateral   . cataract surgery Left    Dr Bing Plume  . CHOLECYSTECTOMY    . COLONOSCOPY    . ESOPHAGEAL DILATION     X 2  . ROTATOR CUFF REPAIR Left   . SINUS ENDO W/FUSION N/A 05/30/2014   Procedure:  REVISION  FRONTAL SINUS SURGERY WITH FUSION SCAN;  Surgeon: Jerrell Belfast, MD;  Location: [facility];  Service: ENT;  Laterality: N/A;  . SINUS SURGERY WITH INSTATRAK     x 2  . TUBAL LIGATION    . UPPER GASTROINTESTINAL ENDOSCOPY    . Vaginal cystectomy     x 2  . VAGINAL HYSTERECTOMY  05/2007   Vaginal repair, Dr Ubaldo Glassing.  Partial  hysterectomy.    Social History   Socioeconomic History  . Marital status: Married    Spouse name: Not on file  . Number of children: 2  . Years of education: 57  . Highest education level: Not on file  Occupational History  . Occupation: retired    Fish farm manager: [employer]  Tobacco Use  . Smoking status: Former Smoker    Packs/day: 0.50    Years: 15.00    Pack years: 7.50    Types: Cigarettes    Quit date: 03/03/1998    Years since quitting: 22.1  . Smokeless tobacco: Never Used  . Tobacco comment: smoked 1973- 2006, up to <  1  ppd  Vaping Use  . Vaping Use: Never used  Substance and Sexual Activity  . Alcohol use: No    Alcohol/week: 0.0 standard drinks  . Drug use: No  . Sexual activity: Yes    Partners: Male    Comment: 1st intercourse- 60, partners- 2, married- 23 yrs   Other Topics Concern  . Not on file  Social History Narrative   HAS REGULAR EXERCISE   DAILY CAFFEINE: 1-2 CUPS   Patient is right handed.   Social Determinants of Health   Financial Resource Strain: Not on file  Food Insecurity: Not on file  Transportation Needs: Not on file  Physical Activity: Not on file  Stress:  Not on file  Social Connections: Not on file    Family History  Problem Relation Age of Onset  . Hypertension Father 2  . Stroke Father 16  . Heart attack Father         ? in 66s  . Hypertension Mother   . Neuropathy Mother   . Anemia Mother   . Coronary artery disease Brother        Stent placement in 60s  . Diabetes Maternal Uncle   . Heart attack Paternal Carmel [place] , ? age  . Colon cancer Neg Hx   . Seizures Neg Hx   . Esophageal cancer Neg Hx   . Rectal cancer Neg Hx   . Stomach cancer Neg Hx     Review of Systems  Constitutional: Positive for fatigue. Negative for fever.  Respiratory: Negative for cough, shortness of breath (occ) and wheezing.   Cardiovascular: Positive for chest pain and palpitations. Negative for leg swelling.  Musculoskeletal: Positive for arthralgias, back pain and neck pain.  Neurological: Positive for tremors (hands - intermittent in severity) and headaches.  Psychiatric/Behavioral: Positive for decreased concentration, dysphoric mood and sleep disturbance. The patient is nervous/anxious.        Objective:   Vitals:   04/27/20 1004  BP: 110/78  Pulse: 61  Temp: 98.1 F (36.7 C)  SpO2: 97%   BP Readings from Last 3 Encounters:  04/27/20 110/78  02/06/20 116/74  01/16/20 118/60   Wt Readings from Last 3 Encounters:  04/27/20 126 lb (57.2 kg)  02/06/20 120 lb (54.4 kg)  01/16/20 121 lb (54.9 kg)   Body mass index  is 19.73 kg/m.   Physical Exam    Constitutional: Appears well-developed and well-nourished. No distress.  HENT:  Head: Normocephalic and atraumatic.  Neck: Neck supple. No tracheal deviation present. No thyromegaly present.  No cervical lymphadenopathy Cardiovascular: Normal rate, regular rhythm and normal heart sounds.   No murmur heard. No carotid bruit .  No edema Pulmonary/Chest: Effort normal and breath sounds normal. No respiratory distress. No has no wheezes. No rales.  Skin: Skin is warm and dry.  Not diaphoretic.  Psychiatric: depressed mood and affect. Behavior is normal.      Assessment & Plan:    See Problem List for Assessment and Plan of chronic medical problems.    This visit occurred during the SARS-CoV-2 public health emergency.  Safety protocols were in place, including screening questions prior to the visit, additional usage of staff PPE, and extensive cleaning of exam room while observing appropriate contact time as indicated for disinfecting solutions.

## 2020-04-27 ENCOUNTER — Ambulatory Visit: Payer: 59 | Admitting: Internal Medicine

## 2020-04-27 ENCOUNTER — Encounter: Payer: Self-pay | Admitting: Internal Medicine

## 2020-04-27 VITALS — BP 110/78 | HR 61 | Temp 98.1°F | Ht 67.0 in | Wt 126.0 lb

## 2020-04-27 DIAGNOSIS — E559 Vitamin D deficiency, unspecified: Secondary | ICD-10-CM

## 2020-04-27 DIAGNOSIS — F5104 Psychophysiologic insomnia: Secondary | ICD-10-CM

## 2020-04-27 DIAGNOSIS — E782 Mixed hyperlipidemia: Secondary | ICD-10-CM | POA: Diagnosis not present

## 2020-04-27 DIAGNOSIS — F411 Generalized anxiety disorder: Secondary | ICD-10-CM

## 2020-04-27 DIAGNOSIS — R7303 Prediabetes: Secondary | ICD-10-CM | POA: Diagnosis not present

## 2020-04-27 DIAGNOSIS — K219 Gastro-esophageal reflux disease without esophagitis: Secondary | ICD-10-CM

## 2020-04-27 DIAGNOSIS — F3289 Other specified depressive episodes: Secondary | ICD-10-CM | POA: Diagnosis not present

## 2020-04-27 DIAGNOSIS — M810 Age-related osteoporosis without current pathological fracture: Secondary | ICD-10-CM | POA: Diagnosis not present

## 2020-04-27 LAB — COMPREHENSIVE METABOLIC PANEL
ALT: 10 U/L (ref 0–35)
AST: 15 U/L (ref 0–37)
Albumin: 4.5 g/dL (ref 3.5–5.2)
Alkaline Phosphatase: 61 U/L (ref 39–117)
BUN: 13 mg/dL (ref 6–23)
CO2: 30 mEq/L (ref 19–32)
Calcium: 9.8 mg/dL (ref 8.4–10.5)
Chloride: 98 mEq/L (ref 96–112)
Creatinine, Ser: 0.6 mg/dL (ref 0.40–1.20)
GFR: 87.33 mL/min (ref >60.00)
Glucose, Bld: 87 mg/dL (ref 70–99)
Potassium: 4.3 mEq/L (ref 3.5–5.1)
Sodium: 135 mEq/L (ref 135–145)
Total Bilirubin: 0.5 mg/dL (ref 0.2–1.2)
Total Protein: 6.9 g/dL (ref 6.0–8.3)

## 2020-04-27 LAB — LIPID PANEL
Cholesterol: 233 mg/dL — ABNORMAL HIGH (ref 0–200)
HDL: 78.4 mg/dL (ref >39.00)
LDL Cholesterol: 139 mg/dL — ABNORMAL HIGH (ref 0–99)
NonHDL: 154.53
Total CHOL/HDL Ratio: 3
Triglycerides: 77 mg/dL (ref 0.0–149.0)
VLDL: 15.4 mg/dL (ref 0.0–40.0)

## 2020-04-27 LAB — HEMOGLOBIN A1C: Hgb A1c MFr Bld: 5.8 % (ref 4.6–6.5)

## 2020-04-27 LAB — VITAMIN D 25 HYDROXY (VIT D DEFICIENCY, FRACTURES): VITD: 62.16 ng/mL (ref 30.00–100.00)

## 2020-04-27 MED ORDER — BUSPIRONE HCL 10 MG PO TABS: 10.0000 mg | ORAL_TABLET | Freq: Three times a day (TID) | ORAL | 3 refills | Status: DC

## 2020-04-27 MED ORDER — FLUOXETINE HCL 40 MG PO CAPS: 40.0000 mg | ORAL_CAPSULE | Freq: Every day | ORAL | 3 refills | Status: DC

## 2020-04-27 MED ORDER — QUETIAPINE FUMARATE 50 MG PO TABS: 50.0000 mg | ORAL_TABLET | Freq: Every day | ORAL | 5 refills | Status: DC

## 2020-04-27 MED ORDER — CLONAZEPAM 0.5 MG PO TABS: 0.5000 mg | ORAL_TABLET | Freq: Two times a day (BID) | ORAL | 0 refills | Status: DC

## 2020-04-27 NOTE — Assessment & Plan Note (Signed)
Chronic Not ideally controlled She would prefer not to see a psychiatrist, but I do think it would be a good idea since we are struggling to get her depression and anxiety controlled Will go back to therapy Continue buspar 10 mg TID Continue clonazepam 0.5 mg BID Increase prozac to 40 mg daily Increase seroquel to 50 mg at night

## 2020-04-27 NOTE — Assessment & Plan Note (Signed)
Chronic Will check vitamin d level Continue exercise

## 2020-04-27 NOTE — Assessment & Plan Note (Signed)
Chronic Not controlled Increase seroquel to 50 mg nightly

## 2020-04-27 NOTE — Assessment & Plan Note (Signed)
Chronic Not ideally controlled She would prefer not to see a psychiatrist, but I do think it would be a good idea since we are struggling to get her anxiety controlled Will go back to therapy Continue buspar 10 mg TID Continue clonazepam 0.5 mg BID Increase prozac to 40 mg daily Increase seroquel to 50 mg at night

## 2020-04-27 NOTE — Assessment & Plan Note (Signed)
Chronic Taking vitamin D daily Check vitamin D level  

## 2020-04-27 NOTE — Assessment & Plan Note (Signed)
Chronic Check a1c Low sugar / carb diet Stressed regular exercise  

## 2020-04-27 NOTE — Assessment & Plan Note (Signed)
Chronic GERD controlled Continue pantoprazole 40 mg daily 

## 2020-04-27 NOTE — Assessment & Plan Note (Signed)
Chronic Check lipid panel  Diet controlled Regular exercise and healthy diet encouraged

## 2020-05-17 NOTE — Progress Notes (Signed)
Subjective:    Patient ID: [name], female    DOB: 06/10/1945, 75 y.o.   MRN: [id number]  HPI The patient is here for follow up of their chronic medical problems, including anxiety, depression and insomnia  She was here 3 weeks c/o not sleeping, memory issues, difficulty concentrating, no motivation, depression and anxiety  I advised starting back with her therapist.   Seeing a psychiatrist, which she declined.   Her seroquel we increased to 50 mg nightly and we increased her prozac to 40 mg daily.  She is sleeping better.  She still wakes up a little.  She talks in her sleep.  She wakes often.  She sleeps about 5 hrs. She feels tired and does not feel like doing anything.  She has no energy.     She still has the jitteriness in her chest.  She feels it mostly first thing in the morning and at night.  She can not tell a lot of difference with her anxiety and depression.   She has not started back with the therapist.     Medications and allergies reviewed with patient and updated if appropriate.  Patient Active Problem List   Diagnosis Date Noted  . Moderate protein malnutrition ([place]) 12/26/2019  . Nausea 09/16/2019  . S/P ORIF (open reduction internal fixation) fracture, left hip 09/16/2019  . Hair loss 07/21/2019  . Weight loss 07/21/2019  . Grief 03/25/2019  . DDD (degenerative disc disease), cervical 09/24/2018  . Pain in left knee 03/17/2018  . Conductive hearing loss, bilateral 03/10/2018  . Prediabetes 12/17/2017  . Neck pain 04/28/2017  . Dizziness 02/26/2017  . B12 deficiency 12/16/2016  . Chest discomfort 05/07/2016  . Back pain 05/07/2016  . Asthma 04/22/2016  . Family history of diabetes mellitus (DM) 12/07/2015  . Constipation due to outlet dysfunction   . Obstructive sleep apnea 07/12/2015  . Seasonal and perennial allergic rhinitis 07/12/2015  . Vitamin D deficiency 12/05/2014  . Rhinitis, chronic 05/30/2014  . Vaginal atrophy  10/20/2012  . Insomnia 10/20/2012  . Orthostatic hypotension 02/16/2012  . Cough 02/13/2011  . Palpitations 12/31/2010  . GERD (gastroesophageal reflux disease) 07/25/2010  . Lymphocytic colitis 06/14/2010  . Depression   . BACK PAIN, CHRONIC 12/27/2009  . Osteoporosis 12/27/2009  . SOB (shortness of breath) 06/25/2009  . Hereditary and idiopathic peripheral neuropathy 12/22/2007  . Hyperlipidemia 03/09/2007  . Generalized anxiety disorder 03/09/2007  . MIGRAINE HEADACHE 03/09/2007  . IBS 03/09/2007  . SPONDYLOSIS 03/09/2007  . SCHATZKI'S RING, HX OF 03/09/2007  . Diaphragmatic hernia 12/30/2006  . GASTRITIS 02/17/2005  . DIVERTICULOSIS OF COLON 01/01/2000    Current Outpatient Medications on File Prior to Visit  Medication Sig Dispense Refill  . AMBULATORY NON FORMULARY MEDICATION Bone 2 tablets twice a day    . Azelastine-Fluticasone 137-50 MCG/ACT SUSP Place 2 sprays into the nose daily.    . bimatoprost (LATISSE) 0.03 % ophthalmic solution Place one drop on applicator and apply evenly along the skin of the eyebrows once daily at bedtime 3 mL 3  . busPIRone (BUSPAR) 10 MG tablet Take 1 tablet (10 mg total) by mouth 3 (three) times daily. For anxiety 90 tablet 3  . CALCIUM-VITAMIN D PO Take 1 tablet by mouth daily.    . Cholecalciferol (VITAMIN D3) 125 MCG (5000 UT) CAPS TAKE 1 CAPSULE BY MOUTH EVERY DAY 90 capsule 0  . clonazePAM (KLONOPIN) 0.5 MG tablet Take 1 tablet (0.5 mg total) by mouth 2 (  two) times daily. For anxiety 60 tablet 0  . Cyanocobalamin (VITAMIN B-12 PO) Take 1 tablet by mouth daily.    . cycloSPORINE (RESTASIS) 0.05 % ophthalmic emulsion Restasis 0.05 % eye drops in a dropperette    . FLUoxetine (PROZAC) 20 MG tablet Take 1 tablet (20 mg total) by mouth daily. 30 tablet 5  . FLUoxetine (PROZAC) 40 MG capsule Take 1 capsule (40 mg total) by mouth daily. For depression and anxiety 90 capsule 3  . ondansetron (ZOFRAN ODT) 4 MG disintegrating tablet Take 1 tablet  (4 mg total) by mouth every 8 (eight) hours as needed for nausea or vomiting. 30 tablet 1  . pantoprazole (PROTONIX) 40 MG tablet TAKE 1 TABLET BY MOUTH DAILY AS NEEDED 30 tablet 1  . Probiotic Product (ALIGN) 4 MG CAPS Take 1 capsule by mouth as needed.    [name] QUEtiapine (SEROQUEL) 50 MG tablet Take 1 tablet (50 mg total) by mouth at bedtime. 30 tablet 5  . Sod Picosulfate-Mag Ox-Cit Acd (CLENPIQ) 10-3.5-12 MG-GM -GM/160ML SOLN Clenpiq 10 mg-3.5 gram-12 gram/160 mL oral solution    . tretinoin (RETIN-A) 0.025 % cream APPLY 1 APPLICATION TO AFFECTED AREA AT BEDTIME  99  . vitamin C (ASCORBIC ACID) 500 MG tablet Take 500 mg by mouth daily.    . [DISCONTINUED] colesevelam (WELCHOL) 625 MG tablet Take 625 mg by mouth 2 (two) times daily with a meal. Take one tablet by mouth twice a day    . [DISCONTINUED] Hyoscyamine-Phenyltoloxamine ([place] NF) [id number] MG CAPS Take one tablet by mouth as needed for abdominal cramping 24 each 0   No current facility-administered medications on file prior to visit.    Past Medical History:  Diagnosis Date  . Allergy   . Anemia   . Anxiety   . Asthma   . Cataract    bil cateracts removed  . Complication of anesthesia   . Depression    generalized anxiety disorder  . Diverticulosis   . Duodenal diverticulum   . Family history of adverse reaction to anesthesia    Mother and Daughters- N/V  . Fibromyalgia   . Gastroesophageal reflux disease with hiatal hernia   . Hiatal hernia   . History of kidney stones   . Hyperlipidemia   . IBS (irritable bowel syndrome)   . Lymphocytic colitis    Dr Sharlett Iles  . Migraine headache   . Mitral valve prolapse   . Neuropathy   . Osteopenia    BMD ordered by GYN  . Osteoporosis   . Peripheral neuropathy    treated as RLS by  Neurology  . PONV (postoperative nausea and vomiting)   . Restless leg syndrome   . Schatzki's ring    History of  . Sleep apnea    On CPAP, has not been using  . Spondylosis     Past  Surgical History:  Procedure Laterality Date  . ANAL RECTAL MANOMETRY N/A 11/14/2015   Procedure: ANO RECTAL MANOMETRY;  Surgeon: Gatha Mayer, MD;  Location: [facility];  Service: Endoscopy;  Laterality: N/A;  . BREAST ENHANCEMENT SURGERY    . CATARACT EXTRACTION Bilateral   . cataract surgery Left    Dr Bing Plume  . CHOLECYSTECTOMY    . COLONOSCOPY    . ESOPHAGEAL DILATION     X 2  . ROTATOR CUFF REPAIR Left   . SINUS ENDO W/FUSION N/A 05/30/2014   Procedure: REVISION  FRONTAL SINUS SURGERY WITH FUSION SCAN;  Surgeon: Jerrell Belfast, MD;  Location: [facility];  Service: ENT;  Laterality: N/A;  . SINUS SURGERY WITH INSTATRAK     x 2  . TUBAL LIGATION    . UPPER GASTROINTESTINAL ENDOSCOPY    . Vaginal cystectomy     x 2  . VAGINAL HYSTERECTOMY  05/2007   Vaginal repair, Dr Ubaldo Glassing.  Partial  hysterectomy.    Social History   Socioeconomic History  . Marital status: Married    Spouse name: Not on file  . Number of children: 2  . Years of education: 28  . Highest education level: Not on file  Occupational History  . Occupation: retired    Fish farm manager: [employer]  Tobacco Use  . Smoking status: Former Smoker    Packs/day: 0.50    Years: 15.00    Pack years: 7.50    Types: Cigarettes    Quit date: 03/03/1998    Years since quitting: 22.2  . Smokeless tobacco: Never Used  . Tobacco comment: smoked 1973- 2006, up to <  1  ppd  Vaping Use  . Vaping Use: Never used  Substance and Sexual Activity  . Alcohol use: No    Alcohol/week: 0.0 standard drinks  . Drug use: No  . Sexual activity: Yes    Partners: Male    Comment: 1st intercourse- 30, partners- 2, married- 34 yrs   Other Topics Concern  . Not on file  Social History Narrative   HAS REGULAR EXERCISE   DAILY CAFFEINE: 1-2 CUPS   Patient is right handed.   Social Determinants of Health   Financial Resource Strain: Not on file  Food Insecurity: Not on file  Transportation Needs: Not on file  Physical Activity: Not on file   Stress: Not on file  Social Connections: Not on file    Family History  Problem Relation Age of Onset  . Hypertension Father 31  . Stroke Father 26  . Heart attack Father         ? in 26s  . Hypertension Mother   . Neuropathy Mother   . Anemia Mother   . Coronary artery disease Brother        Stent placement in 60s  . Diabetes Maternal Uncle   . Heart attack Paternal Carmel [place] , ? age  . Colon cancer Neg Hx   . Seizures Neg Hx   . Esophageal cancer Neg Hx   . Rectal cancer Neg Hx   . Stomach cancer Neg Hx     Review of Systems  Constitutional: Negative for appetite change.  Psychiatric/Behavioral: Positive for dysphoric mood and sleep disturbance. The patient is nervous/anxious.        Objective:   Vitals:   05/18/20 1427  BP: 112/70  Pulse: 64  Temp: 98 F (36.7 C)  SpO2: 92%   BP Readings from Last 3 Encounters:  05/18/20 112/70  04/27/20 110/78  02/06/20 116/74   Wt Readings from Last 3 Encounters:  05/18/20 126 lb (57.2 kg)  04/27/20 126 lb (57.2 kg)  02/06/20 120 lb (54.4 kg)   Body mass index is 19.73 kg/m.   Physical Exam    Constitutional: Appears well-developed and well-nourished. No distress.  Skin: Skin is warm and dry. Not diaphoretic.  Psychiatric: depressed mood and affect. Behavior is normal.      Assessment & Plan:    See Problem List for Assessment and Plan of chronic medical problems.    This visit occurred during  the SARS-CoV-2 public health emergency.  Safety protocols were in place, including screening questions prior to the visit, additional usage of staff PPE, and extensive cleaning of exam room while observing appropriate contact time as indicated for disinfecting solutions.

## 2020-05-18 ENCOUNTER — Ambulatory Visit: Payer: 59 | Admitting: Internal Medicine

## 2020-05-18 ENCOUNTER — Other Ambulatory Visit: Payer: Self-pay

## 2020-05-18 ENCOUNTER — Encounter: Payer: Self-pay | Admitting: Internal Medicine

## 2020-05-18 VITALS — BP 112/70 | HR 64 | Temp 98.0°F | Ht 67.0 in | Wt 126.0 lb

## 2020-05-18 DIAGNOSIS — F3289 Other specified depressive episodes: Secondary | ICD-10-CM | POA: Diagnosis not present

## 2020-05-18 DIAGNOSIS — F411 Generalized anxiety disorder: Secondary | ICD-10-CM | POA: Diagnosis not present

## 2020-05-18 DIAGNOSIS — F5104 Psychophysiologic insomnia: Secondary | ICD-10-CM | POA: Diagnosis not present

## 2020-05-18 MED ORDER — ONDANSETRON 4 MG PO TBDP: 4.0000 mg | ORAL_TABLET | Freq: Three times a day (TID) | ORAL | 0 refills | Status: DC | PRN

## 2020-05-18 NOTE — Assessment & Plan Note (Addendum)
Chronic There has been some improvement with increased Seroquel, but I do not want to increase further She gets about 5 hours, but its poor quality sleep Work on sleep hygiene Need to continue to work on anxiety and depression, which will likely improve insomnia Continue Seroquel 50 mg at bedtime

## 2020-05-18 NOTE — Assessment & Plan Note (Addendum)
Chronic No big change anxiety with increasing prozac and seroquel She still feels anxious She states jitteriness inside, which she has had for a long time-?  True anxiety versus side effect from the medication We will try discontinuing BuSpar and seeing if that helps.  Also possibly be a side effect from the fluoxetine Continue clonazepam 0.5 mg twice daily, fluoxetine 40 mg daily Follow-up in 1 month

## 2020-05-18 NOTE — Patient Instructions (Addendum)
Stop the buspar ( buspirone)   -- lets see how you feel without it.    Continue all your other medications.     You should speak to a therapist.    Follow up in one month

## 2020-05-18 NOTE — Assessment & Plan Note (Signed)
Chronic She does not state much improvement with increasing the fluoxetine and Seroquel Sleep slightly better She has not started back with the therapist-stressed that she needs to see a therapist to help her get through this Again discussed that I think she should be seeing a psychiatrist to help with medication management, but she declined Continue fluoxetine 40 mg daily

## 2020-05-19 ENCOUNTER — Other Ambulatory Visit: Payer: Self-pay | Admitting: Internal Medicine

## 2020-06-08 ENCOUNTER — Other Ambulatory Visit: Payer: Self-pay | Admitting: Internal Medicine

## 2020-06-19 NOTE — Patient Instructions (Addendum)
   Medications changes include :     Decrease prozac (fluoxetine) to 20 mg daily - take for 7 days and then stop.  When you stop this start effexor ( venlafaxine ) 37.5 mg daily  Stop seroquel ( quetiapine) at night and start lunesta 2 mg at night for your sleep.   Your prescription(s) have been submitted to your pharmacy. Please take as directed and contact our office if you believe you are having problem(s) with the medication(s).   You should consider seeing a psychiatrist to help with your depression and anxiety.    Please followup in 4 week

## 2020-06-19 NOTE — Progress Notes (Signed)
Subjective:    Patient ID: [name], female    DOB: [birth year], 75 y.o.   MRN: [id number]  HPI The patient is here for follow up of their chronic medical problems, including depression, anxiety and insomnia.   The buspar was stopped one month ago.  She has not noticed much change in her depression or anxiety.  Neither are controlled.  She is pushing herself to eat more.  She is drinking ensure and shakes.    Insomnia - She wakes frequently.  She often has dreams and will wake up screaming. When she does sleep she sleeps good.  She is having constipation, dry mouth.  She is concerned about side effects from Seroquel such as a constipation and dry mouth.  She is also is concerned about the hypersensitivity to the sun because of the medication.   Still has quivering or jitteriness in chest.  Constant.  This has not improved with discontinuation of the BuSpar.  Medications and allergies reviewed with patient and updated if appropriate.  Patient Active Problem List   Diagnosis Date Noted  . Moderate protein malnutrition ([place]) 12/26/2019  . Nausea 09/16/2019  . S/P ORIF (open reduction internal fixation) fracture, left hip 09/16/2019  . Hair loss 07/21/2019  . Grief 03/25/2019  . DDD (degenerative disc disease), cervical 09/24/2018  . Pain in left knee 03/17/2018  . Conductive hearing loss, bilateral 03/10/2018  . Prediabetes 12/17/2017  . Neck pain 04/28/2017  . Dizziness 02/26/2017  . B12 deficiency 12/16/2016  . Chest discomfort 05/07/2016  . Back pain 05/07/2016  . Asthma 04/22/2016  . Family history of diabetes mellitus (DM) 12/07/2015  . Constipation due to outlet dysfunction   . Obstructive sleep apnea 07/12/2015  . Seasonal and perennial allergic rhinitis 07/12/2015  . Vitamin D deficiency 12/05/2014  . Rhinitis, chronic 05/30/2014  . Vaginal atrophy 10/20/2012  . Insomnia 10/20/2012  . Orthostatic hypotension 02/16/2012  . Cough 02/13/2011  .  Palpitations 12/31/2010  . GERD (gastroesophageal reflux disease) 07/25/2010  . Lymphocytic colitis 06/14/2010  . Depression   . BACK PAIN, CHRONIC 12/27/2009  . Osteoporosis 12/27/2009  . SOB (shortness of breath) 06/25/2009  . Hereditary and idiopathic peripheral neuropathy 12/22/2007  . Hyperlipidemia 03/09/2007  . Generalized anxiety disorder 03/09/2007  . MIGRAINE HEADACHE 03/09/2007  . IBS 03/09/2007  . SPONDYLOSIS 03/09/2007  . SCHATZKI'S RING, HX OF 03/09/2007  . Diaphragmatic hernia 12/30/2006  . GASTRITIS 02/17/2005  . DIVERTICULOSIS OF COLON 01/01/2000    Current Outpatient Medications on File Prior to Visit  Medication Sig Dispense Refill  . AMBULATORY NON FORMULARY MEDICATION Bone 2 tablets twice a day    . Azelastine-Fluticasone 137-50 MCG/ACT SUSP Place 2 sprays into the nose daily.    . bimatoprost (LATISSE) 0.03 % ophthalmic solution Place one drop on applicator and apply evenly along the skin of the eyebrows once daily at bedtime 3 mL 3  . clonazePAM (KLONOPIN) 0.5 MG tablet TAKE 1 TABLET (0.5 MG TOTAL) BY MOUTH 2 (TWO) TIMES DAILY. FOR ANXIETY 60 tablet 0  . CVS D3 125 MCG (5000 UT) capsule TAKE 1 CAPSULE BY MOUTH EVERY DAY 90 capsule 0  . Cyanocobalamin (VITAMIN B-12 PO) Take 1 tablet by mouth daily.    . cycloSPORINE (RESTASIS) 0.05 % ophthalmic emulsion Restasis 0.05 % eye drops in a dropperette    . ondansetron (ZOFRAN ODT) 4 MG disintegrating tablet Take 1 tablet (4 mg total) by mouth every 8 (eight) hours as needed for nausea or  vomiting. 15 tablet 0  . pantoprazole (PROTONIX) 40 MG tablet TAKE 1 TABLET BY MOUTH DAILY AS NEEDED 30 tablet 1  . Probiotic Product (ALIGN) 4 MG CAPS Take 1 capsule by mouth as needed.    . Sod Picosulfate-Mag Ox-Cit Acd (CLENPIQ) 10-3.5-12 MG-GM -GM/160ML SOLN Clenpiq 10 mg-3.5 gram-12 gram/160 mL oral solution    . tretinoin (RETIN-A) 0.025 % cream APPLY 1 APPLICATION TO AFFECTED AREA AT BEDTIME  99  . vitamin C (ASCORBIC ACID)  500 MG tablet Take 500 mg by mouth daily.    . [DISCONTINUED] colesevelam (WELCHOL) 625 MG tablet Take 625 mg by mouth 2 (two) times daily with a meal. Take one tablet by mouth twice a day    . [DISCONTINUED] Hyoscyamine-Phenyltoloxamine ([place] NF) [id number] MG CAPS Take one tablet by mouth as needed for abdominal cramping 24 each 0   No current facility-administered medications on file prior to visit.    Past Medical History:  Diagnosis Date  . Allergy   . Anemia   . Anxiety   . Asthma   . Cataract    bil cateracts removed  . Complication of anesthesia   . Depression    generalized anxiety disorder  . Diverticulosis   . Duodenal diverticulum   . Family history of adverse reaction to anesthesia    Mother and Daughters- N/V  . Fibromyalgia   . Gastroesophageal reflux disease with hiatal hernia   . Hiatal hernia   . History of kidney stones   . Hyperlipidemia   . IBS (irritable bowel syndrome)   . Lymphocytic colitis    Dr Sharlett Iles  . Migraine headache   . Mitral valve prolapse   . Neuropathy   . Osteopenia    BMD ordered by GYN  . Osteoporosis   . Peripheral neuropathy    treated as RLS by  Neurology  . PONV (postoperative nausea and vomiting)   . Restless leg syndrome   . Schatzki's ring    History of  . Sleep apnea    On CPAP, has not been using  . Spondylosis     Past Surgical History:  Procedure Laterality Date  . ANAL RECTAL MANOMETRY N/A 11/14/2015   Procedure: ANO RECTAL MANOMETRY;  Surgeon: Gatha Mayer, MD;  Location: [facility];  Service: Endoscopy;  Laterality: N/A;  . BREAST ENHANCEMENT SURGERY    . CATARACT EXTRACTION Bilateral   . cataract surgery Left    Dr Bing Plume  . CHOLECYSTECTOMY    . COLONOSCOPY    . ESOPHAGEAL DILATION     X 2  . ROTATOR CUFF REPAIR Left   . SINUS ENDO W/FUSION N/A 05/30/2014   Procedure: REVISION  FRONTAL SINUS SURGERY WITH FUSION SCAN;  Surgeon: Jerrell Belfast, MD;  Location: [place];  Service: ENT;  Laterality: N/A;   . SINUS SURGERY WITH INSTATRAK     x 2  . TUBAL LIGATION    . UPPER GASTROINTESTINAL ENDOSCOPY    . Vaginal cystectomy     x 2  . VAGINAL HYSTERECTOMY  05/2007   Vaginal repair, Dr Ubaldo Glassing.  Partial  hysterectomy.    Social History   Socioeconomic History  . Marital status: Married    Spouse name: Not on file  . Number of children: 2  . Years of education: 40  . Highest education level: Not on file  Occupational History  . Occupation: retired    Fish farm manager: [employer]  Tobacco Use  . Smoking status: Former Smoker  Packs/day: 0.50    Years: 15.00    Pack years: 7.50    Types: Cigarettes    Quit date: 03/03/1998    Years since quitting: 22.3  . Smokeless tobacco: Never Used  . Tobacco comment: smoked 1973- 2006, up to <  1  ppd  Vaping Use  . Vaping Use: Never used  Substance and Sexual Activity  . Alcohol use: No    Alcohol/week: 0.0 standard drinks  . Drug use: No  . Sexual activity: Yes    Partners: Male    Comment: 1st intercourse- 32, partners- 2, married- 27 yrs   Other Topics Concern  . Not on file  Social History Narrative   HAS REGULAR EXERCISE   DAILY CAFFEINE: 1-2 CUPS   Patient is right handed.   Social Determinants of Health   Financial Resource Strain: Not on file  Food Insecurity: Not on file  Transportation Needs: Not on file  Physical Activity: Not on file  Stress: Not on file  Social Connections: Not on file    Family History  Problem Relation Age of Onset  . Hypertension Father 79  . Stroke Father 66  . Heart attack Father         ? in 62s  . Hypertension Mother   . Neuropathy Mother   . Anemia Mother   . Coronary artery disease Brother        Stent placement in 60s  . Diabetes Maternal Uncle   . Heart attack Paternal Carmel [place] , ? age  . Colon cancer Neg Hx   . Seizures Neg Hx   . Esophageal cancer Neg Hx   . Rectal cancer Neg Hx   . Stomach cancer Neg Hx     Review of Systems  Constitutional: Negative for fever.   Respiratory: Positive for shortness of breath. Negative for cough and wheezing.   Cardiovascular: Positive for chest pain (occ tightness) and palpitations. Negative for leg swelling.  Neurological: Positive for dizziness and light-headedness. Negative for headaches.  Psychiatric/Behavioral: Positive for dysphoric mood and sleep disturbance. The patient is nervous/anxious.        Objective:   Vitals:   06/20/20 0934  BP: 106/72  Pulse: 65  Temp: 97.9 F (36.6 C)  SpO2: 94%   BP Readings from Last 3 Encounters:  06/20/20 106/72  05/18/20 112/70  04/27/20 110/78   Wt Readings from Last 3 Encounters:  06/20/20 128 lb (58.1 kg)  05/18/20 126 lb (57.2 kg)  04/27/20 126 lb (57.2 kg)   Body mass index is 20.05 kg/m.    Depression screen [facility] 2/9 06/20/2020 12/26/2019 10/20/2017 12/16/2016 06/29/2015  Decreased Interest 3 3 1  0 3  Down, Depressed, Hopeless 3 3 1  0 3  PHQ - 2 Score 6 6 2  0 6  Altered sleeping 2 3 3  - 3  Tired, decreased energy 3 3 2  - 3  Change in appetite 1 2 1  - 2  Feeling bad or failure about yourself  3 0 0 - 2  Trouble concentrating 3 2 1  - 3  Moving slowly or fidgety/restless 2 0 1 - 3  Suicidal thoughts 0 0 0 - 0  PHQ-9 Score 20 16 10  - 22  Difficult doing work/chores - Very difficult - - Extremely dIfficult  Some recent data might be hidden    GAD 7 : Generalized Anxiety Score 06/20/2020  Nervous, Anxious, on Edge 3  Control/stop worrying 3  Worry too  much - different things 3  Trouble relaxing 3  Restless 2  Easily annoyed or irritable 3  Afraid - awful might happen 2  Total GAD 7 Score 19  Anxiety Difficulty Extremely difficult       Physical Exam    Constitutional: Appears well-developed and well-nourished. No distress.  HENT:  Head: Normocephalic and atraumatic.  Neck: Neck supple. No tracheal deviation present. No thyromegaly present.  No cervical lymphadenopathy Cardiovascular: Normal rate, regular rhythm and normal heart sounds.    No murmur heard. No carotid bruit .  No edema Pulmonary/Chest: Effort normal and breath sounds normal. No respiratory distress. No has no wheezes. No rales.  Skin: Skin is warm and dry. Not diaphoretic.  Psychiatric: Depressed mood and affect. Behavior is normal.    The 10-year ASCVD risk score Mikey Bussing DC Jr., et al., 2013) is: 13%   Values used to calculate the score:     Age: 42 years     Sex: Female     Is Non-Hispanic African American: No     Diabetic: No     Tobacco smoker: No     Systolic Blood Pressure: 440 mmHg     Is BP treated: No     HDL Cholesterol: 78.4 mg/dL     Total Cholesterol: 233 mg/dL   Assessment & Plan:    See Problem List for Assessment and Plan of chronic medical problems.    This visit occurred during the SARS-CoV-2 public health emergency.  Safety protocols were in place, including screening questions prior to the visit, additional usage of staff PPE, and extensive cleaning of exam room while observing appropriate contact time as indicated for disinfecting solutions.

## 2020-06-20 ENCOUNTER — Encounter: Payer: Self-pay | Admitting: Internal Medicine

## 2020-06-20 ENCOUNTER — Ambulatory Visit: Payer: 59 | Admitting: Internal Medicine

## 2020-06-20 ENCOUNTER — Other Ambulatory Visit: Payer: Self-pay

## 2020-06-20 VITALS — BP 106/72 | HR 65 | Temp 97.9°F | Ht 67.0 in | Wt 128.0 lb

## 2020-06-20 DIAGNOSIS — F5104 Psychophysiologic insomnia: Secondary | ICD-10-CM

## 2020-06-20 DIAGNOSIS — F411 Generalized anxiety disorder: Secondary | ICD-10-CM | POA: Diagnosis not present

## 2020-06-20 DIAGNOSIS — E782 Mixed hyperlipidemia: Secondary | ICD-10-CM | POA: Diagnosis not present

## 2020-06-20 DIAGNOSIS — F3289 Other specified depressive episodes: Secondary | ICD-10-CM

## 2020-06-20 MED ORDER — ESZOPICLONE 2 MG PO TABS: 2.0000 mg | ORAL_TABLET | Freq: Every evening | ORAL | 0 refills | Status: DC | PRN

## 2020-06-20 MED ORDER — FLUOXETINE HCL 20 MG PO TABS: 20.0000 mg | ORAL_TABLET | Freq: Every day | ORAL | 0 refills | Status: DC

## 2020-06-20 MED ORDER — VENLAFAXINE HCL ER 37.5 MG PO CP24: 37.5000 mg | ORAL_CAPSULE | Freq: Every day | ORAL | 5 refills | Status: DC

## 2020-06-20 NOTE — Assessment & Plan Note (Addendum)
Chronic Not controlled Advised referral to psychiatry, but she declined Concerned that the fluoxetine may be causing the tremor in quivering sensation that she is feeling Taper off prozac  - take 20 mg x 7 days and then stop and start effexor 37.5 mg when off prozac we will titrate Effexor if tolerated

## 2020-06-20 NOTE — Assessment & Plan Note (Addendum)
Chronic Not controlled No significant change since stopping BuSpar Continue clonazepam 0.5 mg twice daily Concerned that the fluoxetine may be causing the tremor in quivering sensation that she is feeling so we will taper her off of this and try Effexor 37.5 mg daily-we will titrate this up if tolerated Stressed to her that I do think she would benefit from seeing a psychiatrist-her depression and anxiety are not ideally controlled and you are having difficulty getting a controlled.  She declined.  She would like to try this change and then consider referral

## 2020-06-20 NOTE — Assessment & Plan Note (Addendum)
Chronic Has not tolerated or has not been effective: trazodone, rozerem, remeron seroquel - causing vivid dreams/nightmares, concern for sensitivity to the sun, affect on cholesterol, dry mouth and constipation Stop seroquel Trial of lunesta 2 mg nightly

## 2020-07-04 ENCOUNTER — Other Ambulatory Visit: Payer: Self-pay | Admitting: Internal Medicine

## 2020-07-19 NOTE — Progress Notes (Signed)
Subjective:    Patient ID: [name], female    DOB: 05-Dec-1945, 75 y.o.   MRN: [id number]  HPI The patient is here for follow up of their chronic medical problems, including depression, anxiety, insomnia.   She fell just she was here.  She was very sore.  She did see orthopedics.    4 weeks ago we tapered her off prozac and started effexor.  I was concerned the Prozac was causing tremor and her anxiety and depression were still not controlled.  We stopped seroquel and started lunesta.    She is depressed.  She is not happy and does not look forward to anything.  She is going to the beach and is not even looking forward to it.  She is having spells, panic attacks - she gets wobbly, nauseated, chest pain, palpitations and SOB - she can not function and has to go to bed.   She still has the vibrating inside, but it is better.    She is still not sleeping well.      Goes to bed 7:30 or 8 pm - watches TV a little, says prayers.  She will often fall asleep listening to the TV to prevent herself from thinking too much-this may be around 9.  She may get up 4-5 times a night - to go to the bathroom or she will just wake up.  She gets up 5-7 am.  Her husband will usually wake her up at 104 to give her the [place].  Recently she has been taking the clonazepam with Lunesta.  She still does not feel like she is sleeping well.  She states fatigue.  She typically is able to go back to sleep when she wakes up.   Medications and allergies reviewed with patient and updated if appropriate.  Patient Active Problem List   Diagnosis Date Noted  . Moderate protein malnutrition ([place]) 12/26/2019  . Nausea 09/16/2019  . S/P ORIF (open reduction internal fixation) fracture, left hip 09/16/2019  . Hair loss 07/21/2019  . Grief 03/25/2019  . DDD (degenerative disc disease), cervical 09/24/2018  . Pain in left knee 03/17/2018  . Conductive hearing loss, bilateral 03/10/2018  .  Prediabetes 12/17/2017  . Neck pain 04/28/2017  . Dizziness 02/26/2017  . B12 deficiency 12/16/2016  . Chest discomfort 05/07/2016  . Back pain 05/07/2016  . Asthma 04/22/2016  . Family history of diabetes mellitus (DM) 12/07/2015  . Constipation due to outlet dysfunction   . Obstructive sleep apnea 07/12/2015  . Seasonal and perennial allergic rhinitis 07/12/2015  . Vitamin D deficiency 12/05/2014  . Rhinitis, chronic 05/30/2014  . Vaginal atrophy 10/20/2012  . Insomnia 10/20/2012  . Orthostatic hypotension 02/16/2012  . Cough 02/13/2011  . Palpitations 12/31/2010  . GERD (gastroesophageal reflux disease) 07/25/2010  . Lymphocytic colitis 06/14/2010  . Depression   . Osteoporosis 12/27/2009  . SOB (shortness of breath) 06/25/2009  . Hereditary and idiopathic peripheral neuropathy 12/22/2007  . Hyperlipidemia 03/09/2007  . Generalized anxiety disorder 03/09/2007  . MIGRAINE HEADACHE 03/09/2007  . IBS 03/09/2007  . SPONDYLOSIS 03/09/2007  . SCHATZKI'S RING, HX OF 03/09/2007  . Diaphragmatic hernia 12/30/2006  . GASTRITIS 02/17/2005  . DIVERTICULOSIS OF COLON 01/01/2000    Current Outpatient Medications on File Prior to Visit  Medication Sig Dispense Refill  . AMBULATORY NON FORMULARY MEDICATION Bone 2 tablets twice a day    . Azelastine-Fluticasone 137-50 MCG/ACT SUSP Place 2 sprays into the nose daily.    [name]  bimatoprost (LATISSE) 0.03 % ophthalmic solution Place one drop on applicator and apply evenly along the skin of the eyebrows once daily at bedtime 3 mL 3  . clonazePAM (KLONOPIN) 0.5 MG tablet TAKE 1 TABLET (0.5 MG TOTAL) BY MOUTH 2 (TWO) TIMES DAILY. FOR ANXIETY 60 tablet 0  . CVS D3 125 MCG (5000 UT) capsule TAKE 1 CAPSULE BY MOUTH EVERY DAY 90 capsule 0  . Cyanocobalamin (VITAMIN B-12 PO) Take 1 tablet by mouth daily.    . cycloSPORINE (RESTASIS) 0.05 % ophthalmic emulsion Restasis 0.05 % eye drops in a dropperette    . eszopiclone (LUNESTA) 2 MG TABS tablet Take 1  tablet (2 mg total) by mouth at bedtime as needed for sleep. Take immediately before bedtime 30 tablet 0  . FLUoxetine (PROZAC) 20 MG tablet Take 1 tablet (20 mg total) by mouth daily. 7 tablet 0  . ondansetron (ZOFRAN ODT) 4 MG disintegrating tablet Take 1 tablet (4 mg total) by mouth every 8 (eight) hours as needed for nausea or vomiting. 15 tablet 0  . pantoprazole (PROTONIX) 40 MG tablet TAKE 1 TABLET BY MOUTH DAILY AS NEEDED 30 tablet 1  . Probiotic Product (ALIGN) 4 MG CAPS Take 1 capsule by mouth as needed.    . Sod Picosulfate-Mag Ox-Cit Acd (CLENPIQ) 10-3.5-12 MG-GM -GM/160ML SOLN Clenpiq 10 mg-3.5 gram-12 gram/160 mL oral solution    . tretinoin (RETIN-A) 0.025 % cream APPLY 1 APPLICATION TO AFFECTED AREA AT BEDTIME  99  . venlafaxine XR (EFFEXOR XR) 37.5 MG 24 hr capsule Take 1 capsule (37.5 mg total) by mouth daily with breakfast. For depression and anxiety.  Start when you stop the fluoxetine 30 capsule 5  . vitamin C (ASCORBIC ACID) 500 MG tablet Take 500 mg by mouth daily.    . QUEtiapine (SEROQUEL) 50 MG tablet Take 50 mg by mouth at bedtime. (Patient not taking: Reported on 07/20/2020)    . [DISCONTINUED] colesevelam (WELCHOL) 625 MG tablet Take 625 mg by mouth 2 (two) times daily with a meal. Take one tablet by mouth twice a day    . [DISCONTINUED] Hyoscyamine-Phenyltoloxamine ([place] NF) [id number] MG CAPS Take one tablet by mouth as needed for abdominal cramping 24 each 0   No current facility-administered medications on file prior to visit.    Past Medical History:  Diagnosis Date  . Allergy   . Anemia   . Anxiety   . Asthma   . Cataract    bil cateracts removed  . Complication of anesthesia   . Depression    generalized anxiety disorder  . Diverticulosis   . Duodenal diverticulum   . Family history of adverse reaction to anesthesia    Mother and Daughters- N/V  . Fibromyalgia   . Gastroesophageal reflux disease with hiatal hernia   . Hiatal hernia   . History of  kidney stones   . Hyperlipidemia   . IBS (irritable bowel syndrome)   . Lymphocytic colitis    Dr Sharlett Iles  . Migraine headache   . Mitral valve prolapse   . Neuropathy   . Osteopenia    BMD ordered by GYN  . Osteoporosis   . Peripheral neuropathy    treated as RLS by  Neurology  . PONV (postoperative nausea and vomiting)   . Restless leg syndrome   . Schatzki's ring    History of  . Sleep apnea    On CPAP, has not been using  . Spondylosis     Past Surgical History:  Procedure Laterality Date  . ANAL RECTAL MANOMETRY N/A 11/14/2015   Procedure: ANO RECTAL MANOMETRY;  Surgeon: Gatha Mayer, MD;  Location: [facility];  Service: Endoscopy;  Laterality: N/A;  . BREAST ENHANCEMENT SURGERY    . CATARACT EXTRACTION Bilateral   . cataract surgery Left    Dr Bing Plume  . CHOLECYSTECTOMY    . COLONOSCOPY    . ESOPHAGEAL DILATION     X 2  . ROTATOR CUFF REPAIR Left   . SINUS ENDO W/FUSION N/A 05/30/2014   Procedure: REVISION  FRONTAL SINUS SURGERY WITH FUSION SCAN;  Surgeon: Jerrell Belfast, MD;  Location: [place];  Service: ENT;  Laterality: N/A;  . SINUS SURGERY WITH INSTATRAK     x 2  . TUBAL LIGATION    . UPPER GASTROINTESTINAL ENDOSCOPY    . Vaginal cystectomy     x 2  . VAGINAL HYSTERECTOMY  05/2007   Vaginal repair, Dr Ubaldo Glassing.  Partial  hysterectomy.    Social History   Socioeconomic History  . Marital status: Married    Spouse name: Not on file  . Number of children: 2  . Years of education: 51  . Highest education level: Not on file  Occupational History  . Occupation: retired    Fish farm manager: [employer]  Tobacco Use  . Smoking status: Former Smoker    Packs/day: 0.50    Years: 15.00    Pack years: 7.50    Types: Cigarettes    Quit date: 03/03/1998    Years since quitting: 22.3  . Smokeless tobacco: Never Used  . Tobacco comment: smoked 1973- 2006, up to <  1  ppd  Vaping Use  . Vaping Use: Never used  Substance and Sexual Activity  . Alcohol use: No     Alcohol/week: 0.0 standard drinks  . Drug use: No  . Sexual activity: Yes    Partners: Male    Comment: 1st intercourse- 65, partners- 2, married- 59 yrs   Other Topics Concern  . Not on file  Social History Narrative   HAS REGULAR EXERCISE   DAILY CAFFEINE: 1-2 CUPS   Patient is right handed.   Social Determinants of Health   Financial Resource Strain: Not on file  Food Insecurity: Not on file  Transportation Needs: Not on file  Physical Activity: Not on file  Stress: Not on file  Social Connections: Not on file    Family History  Problem Relation Age of Onset  . Hypertension Father 30  . Stroke Father 42  . Heart attack Father         ? in 44s  . Hypertension Mother   . Neuropathy Mother   . Anemia Mother   . Coronary artery disease Brother        Stent placement in 60s  . Diabetes Maternal Uncle   . Heart attack Paternal Carmel [place] , ? age  . Colon cancer Neg Hx   . Seizures Neg Hx   . Esophageal cancer Neg Hx   . Rectal cancer Neg Hx   . Stomach cancer Neg Hx     Review of Systems  Constitutional: Positive for fatigue. Negative for fever.       Appetite low - has to force herself to eat  Respiratory: Positive for shortness of breath (with panic attacks).   Cardiovascular: Positive for chest pain (with panic attacks) and palpitations (with panic attacks). Negative for leg swelling.  Gastrointestinal: Positive  for constipation and diarrhea.  Musculoskeletal: Positive for arthralgias, back pain and neck pain.  Neurological: Positive for light-headedness and headaches.  Psychiatric/Behavioral: Positive for dysphoric mood and sleep disturbance. The patient is nervous/anxious.        Objective:   Vitals:   07/20/20 1101  BP: 110/80  Pulse: 67  Temp: 98 F (36.7 C)  SpO2: 97%   BP Readings from Last 3 Encounters:  07/20/20 110/80  06/20/20 106/72  05/18/20 112/70   Wt Readings from Last 3 Encounters:  07/20/20 129 lb (58.5 kg)  06/20/20 128  lb (58.1 kg)  05/18/20 126 lb (57.2 kg)   Body mass index is 20.2 kg/m.   Physical Exam    Constitutional: Appears well-developed and well-nourished. No distress.  HENT:  Head: Normocephalic and atraumatic.  Skin: Skin is warm and dry. Not diaphoretic.  Psychiatric: Depressed mood and affect. Behavior is normal.      Assessment & Plan:    See Problem List for Assessment and Plan of chronic medical problems.    This visit occurred during the SARS-CoV-2 public health emergency.  Safety protocols were in place, including screening questions prior to the visit, additional usage of staff PPE, and extensive cleaning of exam room while observing appropriate contact time as indicated for disinfecting solutions.

## 2020-07-19 NOTE — Patient Instructions (Addendum)
   You need to see a psychiatrist.     Medications changes include :   Increase venlafaxine to 75 mg daily  Your prescription(s) have been submitted to your pharmacy. Please take as directed and contact our office if you believe you are having problem(s) with the medication(s).    Please followup in 3 months     Psychiatry:   [facility] [place]  [place]  [address] [place] [phone]

## 2020-07-20 ENCOUNTER — Other Ambulatory Visit: Payer: Self-pay

## 2020-07-20 ENCOUNTER — Encounter: Payer: Self-pay | Admitting: Internal Medicine

## 2020-07-20 ENCOUNTER — Ambulatory Visit: Payer: 59 | Admitting: Internal Medicine

## 2020-07-20 DIAGNOSIS — F5104 Psychophysiologic insomnia: Secondary | ICD-10-CM

## 2020-07-20 DIAGNOSIS — F3289 Other specified depressive episodes: Secondary | ICD-10-CM | POA: Diagnosis not present

## 2020-07-20 DIAGNOSIS — F411 Generalized anxiety disorder: Secondary | ICD-10-CM | POA: Diagnosis not present

## 2020-07-20 MED ORDER — VENLAFAXINE HCL ER 75 MG PO CP24: 75.0000 mg | ORAL_CAPSULE | Freq: Every day | ORAL | 5 refills | Status: DC

## 2020-07-20 MED ORDER — ESZOPICLONE 2 MG PO TABS: 2.0000 mg | ORAL_TABLET | Freq: Every evening | ORAL | 0 refills | Status: DC | PRN

## 2020-07-20 NOTE — Assessment & Plan Note (Signed)
Chronic Discussed good sleep hygiene.  Encouraged her not to watch TV in bed. In reviewing her sleep in detail I do think she is actually getting enough sleep-it may be a little broken up, but when she wakes up at night she typically can go back to sleep I will not increase the Lunesta.  Encouraged her to take the clonazepam at dinnertime and not at night Continue Lunesta 2 mg at bedtime-ideally I would like to get her off of this because I am not sure how much is helping and I do think once her depression and anxiety are better controlled she will feel better overall

## 2020-07-20 NOTE — Assessment & Plan Note (Signed)
Chronic Her depression is still not controlled Her vibration her tremor is slightly better off the Prozac Increase Effexor to 75 mg daily today Again stressed to her that she really needs to see a psychiatrist to help her depression, anxiety and insomnia.  She finally does agree and will schedule with someone.

## 2020-07-20 NOTE — Assessment & Plan Note (Signed)
Chronic Her anxiety is still not controlled Her vibration her tremor is slightly better off the Prozac She is having panic attacks Increase Effexor to 75 mg daily today Again stressed to her that she really needs to see a psychiatrist to help her depression, anxiety and insomnia.  She finally does agree and will schedule with someone.

## 2020-08-06 ENCOUNTER — Other Ambulatory Visit: Payer: Self-pay | Admitting: Internal Medicine

## 2020-09-10 ENCOUNTER — Other Ambulatory Visit: Payer: Self-pay | Admitting: Internal Medicine

## 2020-09-10 NOTE — Telephone Encounter (Signed)
Patient is requesting a refill of the following medications: Requested Prescriptions   Pending Prescriptions Disp Refills   clonazePAM (KLONOPIN) 0.5 MG tablet [Pharmacy Med Name: CLONAZEPAM 0.5 MG TABLET] 60 tablet 0    Sig: TAKE 1 TABLET (0.5 MG TOTAL) BY MOUTH 2 (TWO) TIMES DAILY. FOR ANXIETY    Date of patient request: 09/10/20  Last office visit: 07/20/20  Date of last refill: 08/06/20  Last refill amount: 60 Follow up time period per chart: 10/23/20

## 2020-09-13 NOTE — Telephone Encounter (Signed)
error 

## 2020-10-01 LAB — HM MAMMOGRAPHY

## 2020-10-08 ENCOUNTER — Encounter: Payer: Self-pay | Admitting: Internal Medicine

## 2020-10-08 NOTE — Progress Notes (Unsigned)
Outside notes received. Information abstracted. Notes sent to scan.  

## 2020-10-11 ENCOUNTER — Telehealth: Payer: Self-pay | Admitting: Internal Medicine

## 2020-10-11 NOTE — Telephone Encounter (Signed)
Patient's VM not set up. Unable to leave message.

## 2020-10-11 NOTE — Telephone Encounter (Signed)
I have not heard anything about this - does she need a diag mammo? Is she having breast pain/

## 2020-10-11 NOTE — Telephone Encounter (Signed)
Patient says she has appt scheduled w/ [facility] tomorrow [phone] @ 10am but they have not received paperwork for diagnostic & scan on right breast  Patient is currently out of town & wants to know if paperwork can be faxed over to [place] before she comes back into town or if she needs to reschedule tomorrows appt  Req callback [phone]

## 2020-10-12 ENCOUNTER — Telehealth: Payer: Self-pay | Admitting: Internal Medicine

## 2020-10-12 LAB — HM MAMMOGRAPHY

## 2020-10-12 NOTE — Telephone Encounter (Addendum)
[facility] called with patient conferenced in. Appt found for patient on 8/12 at 1015.  [facility] states orders for diagnostic mammogram were faxed on 8/5 & 8/11.  Requested refax of orders - confirmed correct fax.    Orders rec'd & placed on PCP desk for signing ASAP.  Assistant aware, will fax orders ASAP.

## 2020-10-12 NOTE — Telephone Encounter (Signed)
Patient is coming in on 08.15.22 for a routine followup  She would like to have bloodwork done after her visit  Patient says she has some red spots appearing on both arms & she thinks it may be something underlying in her bloodstream

## 2020-10-12 NOTE — Telephone Encounter (Signed)
Attempted to fax form several times and received fax conformation today @ 9:40 am.

## 2020-10-14 ENCOUNTER — Other Ambulatory Visit: Payer: Self-pay | Admitting: Internal Medicine

## 2020-10-14 NOTE — Progress Notes (Signed)
Subjective:    Patient ID: [name], female    DOB: Sep 11, 1945, 75 y.o.   MRN: [id number]  HPI The patient is here for follow up of their chronic medical problems, including anxiety, depression, insomnia, gerd, OP, hyperlipidemia, vit d def.  She is here today with her husband.  She is taking her medication as prescribed, except the clonazepam, which she ran out of the last Friday-3 days ago.    She was at the beach a couple of weeks ago.  She was very hot and probably dehydrated.  She was nauseous, extremely weak and fatigued.  Her husband thinks she may have had heat exhaustion.  She really has not felt well since then.  Yesterday she went for a walk and had no strength.  She has been experiencing intermittent chest pain, nausea.  She has been drinking plenty of fluids.  She is not sure if she has been eating as much as she should.  She has not felt like it.  She continues to have the shaking feeling inside of her.  She feels hot at times and then cold.    Medications and allergies reviewed with patient and updated if appropriate.  Patient Active Problem List   Diagnosis Date Noted   Moderate protein malnutrition ([place]) 12/26/2019   Nausea 09/16/2019   S/P ORIF (open reduction internal fixation) fracture, left hip 09/16/2019   Hair loss 07/21/2019   Grief 03/25/2019   DDD (degenerative disc disease), cervical 09/24/2018   Pain in left knee 03/17/2018   Conductive hearing loss, bilateral 03/10/2018   Prediabetes 12/17/2017   Neck pain 04/28/2017   Dizziness 02/26/2017   B12 deficiency 12/16/2016   Back pain 05/07/2016   Asthma 04/22/2016   Family history of diabetes mellitus (DM) 12/07/2015   Constipation due to outlet dysfunction    Obstructive sleep apnea 07/12/2015   Seasonal and perennial allergic rhinitis 07/12/2015   Vitamin D deficiency 12/05/2014   Rhinitis, chronic 05/30/2014   Vaginal atrophy 10/20/2012   Insomnia 10/20/2012    Orthostatic hypotension 02/16/2012   Cough 02/13/2011   Palpitations 12/31/2010   GERD (gastroesophageal reflux disease) 07/25/2010   Lymphocytic colitis 06/14/2010   Depression    Osteoporosis 12/27/2009   SOB (shortness of breath) 06/25/2009   Hereditary and idiopathic peripheral neuropathy 12/22/2007   Hyperlipidemia 03/09/2007   Generalized anxiety disorder 03/09/2007   MIGRAINE HEADACHE 03/09/2007   IBS 03/09/2007   SPONDYLOSIS 03/09/2007   SCHATZKI'S RING, HX OF 03/09/2007   Diaphragmatic hernia 12/30/2006   DIVERTICULOSIS OF COLON 01/01/2000    Current Outpatient Medications on File Prior to Visit  Medication Sig Dispense Refill   AMBULATORY NON FORMULARY MEDICATION Bone Up 2 tablets twice a day      Azelastine-Fluticasone 137-50 MCG/ACT SUSP Place 2 sprays into the nose daily.     bimatoprost (LATISSE) 0.03 % ophthalmic solution Place one drop on applicator and apply evenly along the skin of the eyebrows once daily at bedtime 3 mL 3   clonazePAM (KLONOPIN) 0.5 MG tablet TAKE 1 TABLET (0.5 MG TOTAL) BY MOUTH 2 (TWO) TIMES DAILY. FOR ANXIETY 60 tablet 0   Cyanocobalamin (VITAMIN B-12 PO) Take 1 tablet by mouth daily.     cycloSPORINE (RESTASIS) 0.05 % ophthalmic emulsion Restasis 0.05 % eye drops in a dropperette     eszopiclone (LUNESTA) 2 MG TABS tablet Take 1 tablet (2 mg total) by mouth at bedtime as needed for sleep. Take immediately before bedtime 30 tablet 0  ondansetron (ZOFRAN ODT) 4 MG disintegrating tablet Take 1 tablet (4 mg total) by mouth every 8 (eight) hours as needed for nausea or vomiting. 15 tablet 0   pantoprazole (PROTONIX) 40 MG tablet TAKE 1 TABLET BY MOUTH DAILY AS NEEDED 30 tablet 1   Probiotic Product (ALIGN) 4 MG CAPS Take 1 capsule by mouth as needed.     Sod Picosulfate-Mag Ox-Cit Acd (CLENPIQ) 10-3.5-12 MG-GM -GM/160ML SOLN Clenpiq 10 mg-3.5 gram-12 gram/160 mL oral solution     tretinoin (RETIN-A) 0.025 % cream APPLY 1 APPLICATION TO AFFECTED AREA  AT BEDTIME  99   venlafaxine XR (EFFEXOR XR) 75 MG 24 hr capsule Take 1 capsule (75 mg total) by mouth daily with breakfast. 30 capsule 5   vitamin C (ASCORBIC ACID) 500 MG tablet Take 500 mg by mouth daily.     [DISCONTINUED] colesevelam (WELCHOL) 625 MG tablet Take 625 mg by mouth 2 (two) times daily with a meal. Take one tablet by mouth twice a day     [DISCONTINUED] Hyoscyamine-Phenyltoloxamine (DIGEX NF) [id number] MG CAPS Take one tablet by mouth as needed for abdominal cramping 24 each 0   No current facility-administered medications on file prior to visit.    Past Medical History:  Diagnosis Date   Allergy    Anemia    Anxiety    Asthma    Cataract    bil cateracts removed   Complication of anesthesia    Depression    generalized anxiety disorder   Diverticulosis    Duodenal diverticulum    Family history of adverse reaction to anesthesia    Mother and Daughters- N/V   Fibromyalgia    Gastroesophageal reflux disease with hiatal hernia    Hiatal hernia    History of kidney stones    Hyperlipidemia    IBS (irritable bowel syndrome)    Lymphocytic colitis    Dr Sharlett Iles   Migraine headache    Mitral valve prolapse    Neuropathy    Osteopenia    BMD ordered by GYN   Osteoporosis    Peripheral neuropathy    treated as RLS by  Neurology   PONV (postoperative nausea and vomiting)    Restless leg syndrome    Schatzki's ring    History of   Sleep apnea    On CPAP, has not been using   Spondylosis     Past Surgical History:  Procedure Laterality Date   ANAL RECTAL MANOMETRY N/A 11/14/2015   Procedure: ANO RECTAL MANOMETRY;  Surgeon: Gatha Mayer, MD;  Location: [facility];  Service: Endoscopy;  Laterality: N/A;   BREAST ENHANCEMENT SURGERY     CATARACT EXTRACTION Bilateral    cataract surgery Left    Dr Bing Plume   CHOLECYSTECTOMY     COLONOSCOPY     ESOPHAGEAL DILATION     X 2   ROTATOR CUFF REPAIR Left    SINUS ENDO W/FUSION N/A 05/30/2014   Procedure:  REVISION  FRONTAL SINUS SURGERY WITH FUSION SCAN;  Surgeon: Jerrell Belfast, MD;  Location: [facility];  Service: ENT;  Laterality: N/A;   SINUS SURGERY WITH INSTATRAK     x 2   TUBAL LIGATION     UPPER GASTROINTESTINAL ENDOSCOPY     Vaginal cystectomy     x 2   VAGINAL HYSTERECTOMY  05/2007   Vaginal repair, Dr Ubaldo Glassing.  Partial  hysterectomy.    Social History   Socioeconomic History   Marital status: Married    Spouse  name: Not on file   Number of children: 2   Years of education: 82   Highest education level: Not on file  Occupational History   Occupation: retired    Fish farm manager: [employer]  Tobacco Use   Smoking status: Former    Packs/day: 0.50    Years: 15.00    Pack years: 7.50    Types: Cigarettes    Quit date: 03/03/1998    Years since quitting: 22.6   Smokeless tobacco: Never   Tobacco comments:    smoked 1973- 2006, up to <  1  ppd  Vaping Use   Vaping Use: Never used  Substance and Sexual Activity   Alcohol use: No    Alcohol/week: 0.0 standard drinks   Drug use: No   Sexual activity: Yes    Partners: Male    Comment: 1st intercourse- 82, partners- 2, married- 72 yrs   Other Topics Concern   Not on file  Social History Narrative   HAS REGULAR EXERCISE   DAILY CAFFEINE: 1-2 CUPS   Patient is right handed.   Social Determinants of Health   Financial Resource Strain: Not on file  Food Insecurity: Not on file  Transportation Needs: Not on file  Physical Activity: Not on file  Stress: Not on file  Social Connections: Not on file    Family History  Problem Relation Age of Onset   Hypertension Father 36   Stroke Father 59   Heart attack Father         ? in 18s   Hypertension Mother    Neuropathy Mother    Anemia Mother    Coronary artery disease Brother        Stent placement in 43s   Diabetes Maternal Uncle    Heart attack Paternal Company secretary , ? age   Colon cancer Neg Hx    Seizures Neg Hx    Esophageal cancer Neg Hx    Rectal cancer Neg Hx     Stomach cancer Neg Hx     Review of Systems  Constitutional:  Positive for diaphoresis (feels clammy) and fatigue. Negative for fever.  HENT:  Negative for congestion, sinus pain and sore throat.   Respiratory:  Positive for shortness of breath. Negative for cough and wheezing.   Cardiovascular:  Positive for chest pain. Negative for palpitations.  Gastrointestinal:  Positive for abdominal pain (one day of abd pain), constipation and nausea. Negative for blood in stool and diarrhea.       GERD   Genitourinary:  Negative for dysuria and hematuria.  Neurological:  Positive for light-headedness and headaches (stress related).  Hematological:  Bruises/bleeds easily.  Psychiatric/Behavioral:  Positive for dysphoric mood and sleep disturbance. The patient is nervous/anxious.       Objective:   Vitals:   10/15/20 0807  BP: 110/74  Pulse: 67  Temp: 98.4 F (36.9 C)  SpO2: 99%   BP Readings from Last 3 Encounters:  10/15/20 110/74  07/20/20 110/80  06/20/20 106/72   Wt Readings from Last 3 Encounters:  10/15/20 129 lb (58.5 kg)  07/20/20 129 lb (58.5 kg)  06/20/20 128 lb (58.1 kg)   Body mass index is 20.2 kg/m.   Physical Exam    Constitutional: Appears well-developed and well-nourished. No distress.  HENT:  Head: Normocephalic and atraumatic.  Neck: Neck supple. No tracheal deviation present. No thyromegaly present.  No cervical lymphadenopathy Cardiovascular: Normal rate, regular rhythm and  normal heart sounds.   No murmur heard. No carotid bruit .  No edema Pulmonary/Chest: Effort normal and breath sounds normal. No respiratory distress. No has no wheezes. No rales. Abdomen: Soft, nontender, nondistended Skin: Skin is warm and dry. Not diaphoretic.  Psychiatric: Anxious mood and affect. Behavior is normal.      Assessment & Plan:    See Problem List for Assessment and Plan of chronic medical problems.    This visit occurred during the SARS-CoV-2 public  health emergency.  Safety protocols were in place, including screening questions prior to the visit, additional usage of staff PPE, and extensive cleaning of exam room while observing appropriate contact time as indicated for disinfecting solutions.

## 2020-10-14 NOTE — Patient Instructions (Addendum)
  Call cardiology to schedule an appointment.    Call [facility] to schedule your bone density.   Blood work was ordered.    An EKG was done today.     Medications changes include :   start pantoprazole 40 mg twice for one month and then once daily.    Increase venlafaxine to 150 mg daily  Your prescription(s) have been submitted to your pharmacy. Please take as directed and contact our office if you believe you are having problem(s) with the medication(s).    Please followup in 1 month      [facility] [place]  Dr Mel Almond - [phone] Dr Terrance Mass   [phone] Dr Ernestene Mention   [phone]  Sandford Craze - clinical social worker/therapist -  [phone]  [facility] [place] [phone]  Dr Letta Moynahan [address] [place]  [place] [address] [phone]  [facility] [phone] Office  [facility]            [facility] [address] [place] [phone]

## 2020-10-15 ENCOUNTER — Other Ambulatory Visit: Payer: Self-pay

## 2020-10-15 ENCOUNTER — Ambulatory Visit: Payer: 59 | Admitting: Internal Medicine

## 2020-10-15 ENCOUNTER — Encounter: Payer: Self-pay | Admitting: Internal Medicine

## 2020-10-15 ENCOUNTER — Telehealth: Payer: Self-pay | Admitting: Cardiovascular Disease

## 2020-10-15 VITALS — BP 110/74 | HR 67 | Temp 98.4°F | Ht 67.0 in | Wt 129.0 lb

## 2020-10-15 DIAGNOSIS — E538 Deficiency of other specified B group vitamins: Secondary | ICD-10-CM | POA: Diagnosis not present

## 2020-10-15 DIAGNOSIS — E559 Vitamin D deficiency, unspecified: Secondary | ICD-10-CM | POA: Diagnosis not present

## 2020-10-15 DIAGNOSIS — F3289 Other specified depressive episodes: Secondary | ICD-10-CM | POA: Diagnosis not present

## 2020-10-15 DIAGNOSIS — K219 Gastro-esophageal reflux disease without esophagitis: Secondary | ICD-10-CM

## 2020-10-15 DIAGNOSIS — R233 Spontaneous ecchymoses: Secondary | ICD-10-CM

## 2020-10-15 DIAGNOSIS — R079 Chest pain, unspecified: Secondary | ICD-10-CM | POA: Diagnosis not present

## 2020-10-15 DIAGNOSIS — R7303 Prediabetes: Secondary | ICD-10-CM

## 2020-10-15 DIAGNOSIS — E782 Mixed hyperlipidemia: Secondary | ICD-10-CM | POA: Diagnosis not present

## 2020-10-15 DIAGNOSIS — F411 Generalized anxiety disorder: Secondary | ICD-10-CM

## 2020-10-15 DIAGNOSIS — F5104 Psychophysiologic insomnia: Secondary | ICD-10-CM

## 2020-10-15 DIAGNOSIS — R238 Other skin changes: Secondary | ICD-10-CM | POA: Diagnosis not present

## 2020-10-15 DIAGNOSIS — R0602 Shortness of breath: Secondary | ICD-10-CM

## 2020-10-15 DIAGNOSIS — M810 Age-related osteoporosis without current pathological fracture: Secondary | ICD-10-CM

## 2020-10-15 LAB — CBC WITH DIFFERENTIAL/PLATELET
Basophils Absolute: 0.1 10*3/uL (ref 0.0–0.1)
Basophils Relative: 1 % (ref 0.0–3.0)
Eosinophils Absolute: 0.1 10*3/uL (ref 0.0–0.7)
Eosinophils Relative: 0.9 % (ref 0.0–5.0)
HCT: 42 % (ref 36.0–46.0)
Hemoglobin: 14.3 g/dL (ref 12.0–15.0)
Lymphocytes Relative: 19 % (ref 12.0–46.0)
Lymphs Abs: 1.2 10*3/uL (ref 0.7–4.0)
MCHC: 34 g/dL (ref 30.0–36.0)
MCV: 91.3 fl (ref 78.0–100.0)
Monocytes Absolute: 0.4 10*3/uL (ref 0.1–1.0)
Monocytes Relative: 6.9 % (ref 3.0–12.0)
Neutro Abs: 4.5 10*3/uL (ref 1.4–7.7)
Neutrophils Relative %: 72.2 % (ref 43.0–77.0)
Platelets: 273 10*3/uL (ref 150.0–400.0)
RBC: 4.6 Mil/uL (ref 3.87–5.11)
RDW: 14.1 % (ref 11.5–15.5)
WBC: 6.3 10*3/uL (ref 4.0–10.5)

## 2020-10-15 LAB — LIPID PANEL
Cholesterol: 222 mg/dL — ABNORMAL HIGH (ref 0–200)
HDL: 80.5 mg/dL (ref >39.00)
LDL Cholesterol: 128 mg/dL — ABNORMAL HIGH (ref 0–99)
NonHDL: 141.92
Total CHOL/HDL Ratio: 3
Triglycerides: 70 mg/dL (ref 0.0–149.0)
VLDL: 14 mg/dL (ref 0.0–40.0)

## 2020-10-15 LAB — HEMOGLOBIN A1C: Hgb A1c MFr Bld: 6.1 % (ref 4.6–6.5)

## 2020-10-15 LAB — COMPREHENSIVE METABOLIC PANEL
ALT: 18 U/L (ref 0–35)
AST: 24 U/L (ref 0–37)
Albumin: 4.5 g/dL (ref 3.5–5.2)
Alkaline Phosphatase: 68 U/L (ref 39–117)
BUN: 13 mg/dL (ref 6–23)
CO2: 26 mEq/L (ref 19–32)
Calcium: 9.4 mg/dL (ref 8.4–10.5)
Chloride: 98 mEq/L (ref 96–112)
Creatinine, Ser: 0.57 mg/dL (ref 0.40–1.20)
GFR: 88.13 mL/min (ref >60.00)
Glucose, Bld: 87 mg/dL (ref 70–99)
Potassium: 4.1 mEq/L (ref 3.5–5.1)
Sodium: 134 mEq/L — ABNORMAL LOW (ref 135–145)
Total Bilirubin: 0.6 mg/dL (ref 0.2–1.2)
Total Protein: 7.1 g/dL (ref 6.0–8.3)

## 2020-10-15 LAB — TROPONIN I (HIGH SENSITIVITY): High Sens Troponin I: 6 ng/L (ref 2–17)

## 2020-10-15 LAB — VITAMIN D 25 HYDROXY (VIT D DEFICIENCY, FRACTURES): VITD: 46.43 ng/mL (ref 30.00–100.00)

## 2020-10-15 LAB — VITAMIN B12: Vitamin B-12: 603 pg/mL (ref 211–911)

## 2020-10-15 MED ORDER — CLONAZEPAM 0.5 MG PO TABS: 0.5000 mg | ORAL_TABLET | Freq: Two times a day (BID) | ORAL | 0 refills | Status: DC

## 2020-10-15 MED ORDER — VENLAFAXINE HCL ER 150 MG PO CP24: 150.0000 mg | ORAL_CAPSULE | Freq: Every day | ORAL | 1 refills | Status: DC

## 2020-10-15 MED ORDER — ONDANSETRON 4 MG PO TBDP: 4.0000 mg | ORAL_TABLET | Freq: Three times a day (TID) | ORAL | 0 refills | Status: DC | PRN

## 2020-10-15 MED ORDER — PANTOPRAZOLE SODIUM 40 MG PO TBEC: 40.0000 mg | DELAYED_RELEASE_TABLET | Freq: Two times a day (BID) | ORAL | 5 refills | Status: DC

## 2020-10-15 NOTE — Assessment & Plan Note (Signed)
Chronic Taking oral B12 Will check B12 level

## 2020-10-15 NOTE — Telephone Encounter (Signed)
Spoke with patient and she saw her primary care provider. They did some EKG's and requested that she call us for appointment. She has had chest pain over the last few weeks. We discussed signs and symptoms that would require further assessment in the ED. She adamantly refused to go there and only wants appointment. She refuses to go there and states she only wants appointment. Next available is end of September. Placed her in DOD slot and again reviewed recommendations to proceed to ER if her pain persists or worsens. She was appreciative for the appointment with no further questions at this time.   Reviewed previous testing and stress test 06/08/19 was normal, Calcium score was 0 11/09/14.

## 2020-10-15 NOTE — Assessment & Plan Note (Signed)
Chronic Taking vitamin D daily Check vitamin D level  

## 2020-10-15 NOTE — Assessment & Plan Note (Addendum)
Chronic GERD not controlled-experiencing GERD daily and nausea and difficulty eating Currently not taking anything Start pantoprazole 40 mg bid x 2 weeks and then once daily

## 2020-10-15 NOTE — Assessment & Plan Note (Signed)
Chronic Check lipid panel  Diet controlled - calcium score in 2016 was 0 Regular exercise and healthy diet encouraged

## 2020-10-15 NOTE — Telephone Encounter (Signed)
Pt c/o of Chest Pain: STAT if CP now or developed within 24 hours  1. Are you having CP right now? Yes, not as bad   2. Are you experiencing any other symptoms (ex. SOB, nausea, vomiting, sweating)? Nausea, some SOB  3. How long have you been experiencing CP? This past weekend  4. Is your CP continuous or coming and going?comes and goes, but mainly is continual  5. Have you taken Nitroglycerin? no ?
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
## 2020-10-15 NOTE — Assessment & Plan Note (Signed)
Chronic Poorly controlled Will increase venlafaxine from 75 mg daily to 150 mg daily Stressed once again that she needs to establish with a psychiatrist and needs to get in to see a therapist

## 2020-10-15 NOTE — Assessment & Plan Note (Signed)
Acute Has increased bruising for no apparent reason Check CBC, iron panel, CMP Some of her medications could be contributing

## 2020-10-15 NOTE — Assessment & Plan Note (Addendum)
Chronic Taking calcium and vitamin D Will check vitamin D level Stressed regular exercise Was on Fosamax for 5 years or more in the past Would be candidate for Prolia or Evenity-due for DEXA-this was ordered in the past and advised that she call to schedule this

## 2020-10-15 NOTE — Assessment & Plan Note (Signed)
Chronic Not controlled Largely related to uncontrolled anxiety and depression We have tried several medications and nothing is helped Can continue Lunesta 2 mg nightly for now

## 2020-10-15 NOTE — Assessment & Plan Note (Signed)
Acute Experiencing shortness of breath, along with chest pain, uncontrolled GERD, uncontrolled anxiety, depression She has had a normal stress test just over a year ago and a coronary artery calcium score of 0 a few years ago EKG here today was done twice-initial EKG shows possible A. fib, but the baseline is very poor-she was visibly shaking when this was occurring Repeat EKG showed sinus bradycardia cardia at 58 bpm, normal EKG, which shows no significant change compared to EKG from last year Advised that she does follow-up with cardiology Check labs today CBC, CMP, TSH Her anxiety is likely contributing to some of her symptoms Follow-up in 1 month

## 2020-10-15 NOTE — Assessment & Plan Note (Signed)
Chronic Check a1c Low sugar / carb diet Stressed regular exercise  

## 2020-10-15 NOTE — Assessment & Plan Note (Signed)
Acute Having atypical chest pain for cardiac ischemia, but will check troponin level-has had chest pain intermittently for the past 2 weeks EKG today shows sinus bradycardia at 58 bpm, otherwise normal EKG.  This is unchanged from EKG from last year.  (Initial EKG today showed possible atrial fibrillation, but baseline is extremely poor and she was visibly shaking during the exam so I do not think this is an accurate EKG) Encouraged her to follow-up with her cardiologist-stress test just over a year ago negative for ischemia Some of her heartburn may be related to uncontrolled GERD-we will start her on a PPI Check blood work-CBC, CMP, TSH

## 2020-10-15 NOTE — Assessment & Plan Note (Signed)
Chronic Worse since the death of her grandson Not controlled Probably worse now since she ran out of clonazepam 3 days ago We will increase venlafaxine to 150 mg daily Restart clonazepam 0.5 mg twice daily I have talked with her several times about the fact that she needs to see a psychiatrist to help get this better controlled.  She also needs to see a therapist-she did try to schedule an appointment with a therapist and was not able to find 1 Names and numbers of therapists and psychiatrists given

## 2020-10-16 ENCOUNTER — Encounter: Payer: Self-pay | Admitting: Cardiovascular Disease

## 2020-10-16 ENCOUNTER — Ambulatory Visit: Payer: 59 | Admitting: Cardiovascular Disease

## 2020-10-16 VITALS — BP 94/60 | HR 62 | Ht 67.5 in | Wt 132.5 lb

## 2020-10-16 DIAGNOSIS — F411 Generalized anxiety disorder: Secondary | ICD-10-CM

## 2020-10-16 DIAGNOSIS — R0602 Shortness of breath: Secondary | ICD-10-CM

## 2020-10-16 DIAGNOSIS — R002 Palpitations: Secondary | ICD-10-CM

## 2020-10-16 DIAGNOSIS — I951 Orthostatic hypotension: Secondary | ICD-10-CM | POA: Diagnosis not present

## 2020-10-16 DIAGNOSIS — F418 Other specified anxiety disorders: Secondary | ICD-10-CM

## 2020-10-16 DIAGNOSIS — R079 Chest pain, unspecified: Secondary | ICD-10-CM

## 2020-10-16 DIAGNOSIS — E782 Mixed hyperlipidemia: Secondary | ICD-10-CM

## 2020-10-16 LAB — IRON,TIBC AND FERRITIN PANEL
%SAT: 39 % (calc) (ref 16–45)
Ferritin: 87 ng/mL (ref 16–288)
Iron: 147 ug/dL (ref 45–160)
TIBC: 379 mcg/dL (calc) (ref 250–450)

## 2020-10-16 MED ORDER — METOPROLOL TARTRATE 50 MG PO TABS: 50.0000 mg | ORAL_TABLET | Freq: Once | ORAL | 0 refills | Status: DC

## 2020-10-16 NOTE — Patient Instructions (Addendum)
Medication Instructions:  No changes  If you need a refill on your cardiac medications before your next appointment, please call your pharmacy.   Lab work: No new labs needed (CMP completed yesterday)  Testing/Procedures: Cardiac CTA Thursday August 8/25 at 3:30 pm   Follow-Up: At [facility], you and your health needs are our priority.  As part of our continuing mission to provide you with exceptional heart care, we have created designated Provider Care Teams.  These Care Teams include your primary Cardiologist (physician) and Advanced Practice Providers (APPs -  Physician Assistants and Nurse Practitioners) who all work together to provide you with the care you need, when you need it.  You will need a follow up appointment as needed  Providers on your designated Care Team:   Murray Hodgkins, NP Christell Faith, PA-C Marrianne Mood, PA-C Cadence Petty, [place]  COVID-19 Vaccine Information can be found at: [email or web address] For questions related to vaccine distribution or appointments, please email [email or web address] or call [phone].    Cardiac (coronary) CTA  [facility] [address] [place], [place] [address] [phone]  Please arrive 15 mins early for check-in and test prep. Thursday August 8/25 at 3:30 pm  On the Night Before the Test: Be sure to Drink plenty of water. Do not consume any caffeinated/decaffeinated beverages or chocolate 12 hours prior to your test. No decaf coffee as well Do not take any antihistamines 12 hours prior to your test. No Benadryl  On the Day of the Test: Drink plenty of water until 1 hour prior to the test. Do not eat any food 4 hours prior to the test. You may take your regular medications prior to the test.  Take metoprolol (Lopressor) two hours prior to test. Lopressor 50 mg Has been sent in to pharmacy for pick  up HOLD Furosemide/Hydrochlorothiazide morning of the test. No fluid pills  FEMALES- please wear underwire-free bra if available      After the Test: Drink plenty of water. After receiving IV contrast, you may experience a mild flushed feeling. This is normal. On occasion, you may experience a mild rash up to 24 hours after the test. This is not dangerous. If this occurs, you can take Benadryl 25 mg and increase your fluid intake. If you experience trouble breathing, this can be serious. If it is severe call 911 IMMEDIATELY. If it is mild, please call our office. If you take any of these medications: Glipizide/Metformin, Avandament, Glucavance, please do not take 48 hours after completing test unless otherwise instructed.   For scheduling needs, including cancellations and rescheduling, please call [place], [phone].
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
## 2020-10-19 ENCOUNTER — Telehealth: Payer: Self-pay | Admitting: Internal Medicine

## 2020-10-22 NOTE — Addendum Note (Signed)
Addended by: Elza Rafter D on: 10/22/2020 05:11 PM   Modules accepted: Orders

## 2020-10-23 ENCOUNTER — Telehealth (HOSPITAL_COMMUNITY): Payer: Self-pay | Admitting: *Deleted

## 2020-10-23 ENCOUNTER — Ambulatory Visit: Payer: 59 | Admitting: Internal Medicine

## 2020-10-23 NOTE — Telephone Encounter (Signed)
Reaching out to patient to offer assistance regarding upcoming cardiac imaging study; pt verbalizes understanding of appt date/time, parking situation and where to check in, pre-test NPO status and medications ordered; name and call back number provided for further questions should they arise  Gordy Clement RN Navigator Cardiac Imaging [facility] [phone] office [phone] cell  Patient to take '50mg'$  metoprolol tartrate two hours prior to cardiac scan.

## 2020-10-24 ENCOUNTER — Telehealth: Payer: Self-pay | Admitting: Internal Medicine

## 2020-10-24 NOTE — Telephone Encounter (Signed)
Patient with alternating bowel habits.  She declines APP .  She is scheduled for 01/01/21 1:30

## 2020-10-25 ENCOUNTER — Other Ambulatory Visit: Payer: Self-pay

## 2020-10-25 ENCOUNTER — Ambulatory Visit
Admission: RE | Admit: 2020-10-25 | Discharge: 2020-10-25 | Disposition: A | Discharge status: Home / Self Care | Payer: 59 | Source: Ambulatory Visit | Attending: Cardiovascular Disease | Admitting: Cardiovascular Disease

## 2020-10-25 DIAGNOSIS — R079 Chest pain, unspecified: Secondary | ICD-10-CM | POA: Diagnosis present

## 2020-10-25 DIAGNOSIS — I2510 Atherosclerotic heart disease of native coronary artery without angina pectoris: Secondary | ICD-10-CM

## 2020-10-25 DIAGNOSIS — R0602 Shortness of breath: Secondary | ICD-10-CM | POA: Diagnosis present

## 2020-10-25 MED ORDER — IOHEXOL 350 MG/ML SOLN
75.0000 mL | Freq: Once | INTRAVENOUS | Status: AC | PRN
  Administered 2020-10-25: 75 mL via INTRAVENOUS

## 2020-10-25 MED ORDER — NITROGLYCERIN 0.4 MG SL SUBL
0.8000 mg | SUBLINGUAL_TABLET | Freq: Once | SUBLINGUAL | Status: AC
  Administered 2020-10-25: 0.8 mg via SUBLINGUAL

## 2020-10-25 NOTE — Progress Notes (Signed)
Patient tolerated CT well. Drank water after. Vital signs stable encourage to drink water throughout day.Reasons explained and verbalized understanding. Ambulated steady gait.  

## 2020-10-29 ENCOUNTER — Other Ambulatory Visit: Payer: Self-pay | Admitting: Internal Medicine

## 2020-10-29 NOTE — Telephone Encounter (Signed)
Calling to check status of refill request  Patient is leaving to go out of town & is trying to get it refilled before she leaves

## 2020-11-01 ENCOUNTER — Telehealth: Payer: Self-pay | Admitting: Cardiovascular Disease

## 2020-11-01 NOTE — Telephone Encounter (Signed)
Patient calling to discuss recent  10/25/20 CT testing results   Please call

## 2020-11-02 NOTE — Telephone Encounter (Signed)
Attempted to reach out to [name], unable to make contact. Cannot LM, VM not set up. Dr. Rockey Situ has NOT reviewed her CCTA results at this time. Results are sitting in his basket to review.  Preliminary results reviewed by nurse.  IMPRESSION: 1. Coronary calcium score of 52.5. This was 48th percentile for age and sex matched control.   2. Normal coronary origin with right dominance.   3. Calcified plaque causing mild non obstructive CAD in the proximal LAD (25%).   4. CAD-RADS 2. Mild non-obstructive CAD (25-49%). Consider non-atherosclerotic causes of chest pain. Consider preventive therapy and risk factor modification.

## 2020-11-02 NOTE — Telephone Encounter (Signed)
Patient returning call .  Notified of below and is aware we will call back when results and poc received from [facility].

## 2020-11-07 ENCOUNTER — Telehealth: Payer: Self-pay

## 2020-11-07 NOTE — Telephone Encounter (Signed)
Able to reach pt regarding her recent CCTA Dr. Rockey Situ had a chance to review her results and advised   "Cardiac CTA  No significant blockages noted  Very small amount coronary calcification, this should not cause symptoms  Causes symptoms likely noncardiac "  [name] very thankful for the phone call of her results, all questions and concerns were address with nothing further at this time. Will see at next schedule f/u appt.

## 2020-11-13 ENCOUNTER — Other Ambulatory Visit: Payer: Self-pay | Admitting: Internal Medicine

## 2020-11-14 ENCOUNTER — Encounter: Payer: Self-pay | Admitting: Internal Medicine

## 2020-11-14 NOTE — Progress Notes (Unsigned)
Outside notes received. Information abstracted. Notes sent to scan.  

## 2020-11-16 ENCOUNTER — Ambulatory Visit: Payer: 59 | Admitting: Internal Medicine

## 2020-11-26 ENCOUNTER — Telehealth: Payer: Self-pay | Admitting: Internal Medicine

## 2020-11-26 DIAGNOSIS — F5104 Psychophysiologic insomnia: Secondary | ICD-10-CM

## 2020-11-26 DIAGNOSIS — F3289 Other specified depressive episodes: Secondary | ICD-10-CM

## 2020-11-26 DIAGNOSIS — F4321 Adjustment disorder with depressed mood: Secondary | ICD-10-CM

## 2020-11-26 DIAGNOSIS — F411 Generalized anxiety disorder: Secondary | ICD-10-CM

## 2020-11-26 NOTE — Telephone Encounter (Signed)
Okay-is this Selina Cooley in [place]?  I can put a referral in-she may not need 1-often you can call and make an annual appointment with a psychiatrist.  Just confirm this is correct psychiatrist before we send the referral.

## 2020-11-26 NOTE — Telephone Encounter (Signed)
Patient calling in requesting to have nurse call her back  Says she would like a referral to Dr. Domingo Cocking who is a psychiatrist  Please follow up

## 2020-11-27 NOTE — Telephone Encounter (Signed)
Patient called in to updated psychiatrist information bc she gave the wrong name  Referral should be sent to:  [facility] Dr. Elie Goody Hisda [phone] (p) [phone] (f)

## 2020-11-27 NOTE — Telephone Encounter (Signed)
Referral ordered

## 2020-11-28 ENCOUNTER — Other Ambulatory Visit: Payer: Self-pay | Admitting: Internal Medicine

## 2020-12-12 ENCOUNTER — Other Ambulatory Visit: Payer: Self-pay | Admitting: Internal Medicine

## 2020-12-26 NOTE — Progress Notes (Signed)
Psychiatric Initial Adult Assessment   Patient Identification: [name] MRN:  [id number] Date of Evaluation:  12/31/2020 Referral Source: [name], MD  Chief Complaint:   Chief Complaint   Depression; Anxiety; Establish Care    Visit Diagnosis:    ICD-10-CM   1. MDD (major depressive disorder), recurrent episode, moderate (HCC)  F33.1       History of Present Illness:   [name] is a 75 y.o. year old female with a history of depression, anxiety, hyperlipidemia, Obstructive sleep apnea, hiatal hernia, GERD, gastritis, Schatzkes ring s/p dilatations , who is referred for depression.   Majority of the history is obtained by [name], her husband who presents to the interview.  She states that she lost her grandson from [place] 2 years ago.  He was 77 year old.  He stayed at their place most of the time.  She thinks about him all the time.  She has "vibrate inside" of her since she lost him. She feels like her "sunshine is gone" She has not been able to talk about him with her daughter, who lost the son, although she reports good relationship with her otherwise.  Although she used to do cleaning, working on outside of the house, she does not do any of these as she has no motivation to do so.  She feels tired and has difficulty in concentration.  She does not think any of the medication prescribed by her PCP has been helping her and would like to adjust the medication. (Of note, she frequently what changes will be made for her medication, and repeatedly asked this clinician whether one of medication prescribed by Dr. Quay Burow will be prescribed by this clinician. )  [name] states that she seems to have difficulty in comprehension.  She stays in the house most of the time. Although she used to clean rooms frequently, the rooms are not clean anymore.  He needs to remind her to take a shower.  She is not on top of things anymore.  Both [name] and the patient states  that these symptoms never happened before the loss of their grandson.   She has mood symptoms as described below for the past 2 years.  She denies SI.  She denies alcohol use or drug use.   Medication- venlafaxine 150 mg daily (uptitrated two months ago), clonazepam 0.5 mg twice a day, lunesta 2 mg qhs prn for insomnia,   Wt Readings from Last 3 Encounters:  12/31/20 134 lb (60.8 kg)  10/16/20 132 lb 8 oz (60.1 kg)  10/15/20 129 lb (58.5 kg)    Daily routine: stays in the house Exercise: Employment: retired Support:husband  Household: husband Marital status: married Number of children: 2   Associated Signs/Symptoms: Depression Symptoms:  depressed mood, anhedonia, insomnia, fatigue, difficulty concentrating, anxiety, (Hypo) Manic Symptoms:   denies decreased need for sleep, euphoria Anxiety Symptoms:  Excessive Worry, Psychotic Symptoms:   denies AH, Vh, paranoia PTSD Symptoms: Negative  Past Psychiatric History:  Outpatient: denies  Psychiatry admission: denies Previous suicide attempt: denies Past trials of medication: sertraline, fluoxetine, duloxetine, mirtazapine, Pristiq, quetiapine, trazodone, per chart review History of violence:    Previous Psychotropic Medications: Yes   Substance Abuse History in the last 12 months:  No.  Consequences of Substance Abuse: Negative  Past Medical History:  Past Medical History:  Diagnosis Date   Allergy    Anemia    Anxiety    Asthma    Cataract    bil cateracts  removed   Complication of anesthesia    Depression    generalized anxiety disorder   Diverticulosis    Duodenal diverticulum    Family history of adverse reaction to anesthesia    Mother and Daughters- N/V   Fibromyalgia    Gastroesophageal reflux disease with hiatal hernia    Hiatal hernia    History of kidney stones    Hyperlipidemia    IBS (irritable bowel syndrome)    Lymphocytic colitis    Dr Sharlett Iles   Migraine headache    Mitral valve  prolapse    Neuropathy    Osteopenia    BMD ordered by GYN   Osteoporosis    Peripheral neuropathy    treated as RLS by  Neurology   PONV (postoperative nausea and vomiting)    Restless leg syndrome    Schatzki's ring    History of   Sleep apnea    On CPAP, has not been using   Spondylosis     Past Surgical History:  Procedure Laterality Date   ANAL RECTAL MANOMETRY N/A 11/14/2015   Procedure: ANO RECTAL MANOMETRY;  Surgeon: Gatha Mayer, MD;  Location: [facility];  Service: Endoscopy;  Laterality: N/A;   BREAST ENHANCEMENT SURGERY     CATARACT EXTRACTION Bilateral    cataract surgery Left    Dr Bing Plume   CHOLECYSTECTOMY     COLONOSCOPY     ESOPHAGEAL DILATION     X 2   ROTATOR CUFF REPAIR Left    SINUS ENDO W/FUSION N/A 05/30/2014   Procedure: REVISION  FRONTAL SINUS SURGERY WITH FUSION SCAN;  Surgeon: Jerrell Belfast, MD;  Location: [facility];  Service: ENT;  Laterality: N/A;   SINUS SURGERY WITH INSTATRAK     x 2   TUBAL LIGATION     UPPER GASTROINTESTINAL ENDOSCOPY     Vaginal cystectomy     x 2   VAGINAL HYSTERECTOMY  05/2007   Vaginal repair, Dr Ubaldo Glassing.  Partial  hysterectomy.    Family Psychiatric History: as below  Family History:  Family History  Problem Relation Age of Onset   Hypertension Father 62   Stroke Father 56   Heart attack Father         ? in 62s   Hypertension Mother    Neuropathy Mother    Anemia Mother    Coronary artery disease Brother        Stent placement in 59s   Diabetes Maternal Uncle    Heart attack Paternal Company secretary , ? age   Colon cancer Neg Hx    Seizures Neg Hx    Esophageal cancer Neg Hx    Rectal cancer Neg Hx    Stomach cancer Neg Hx     Social History:   Social History   Socioeconomic History   Marital status: Married    Spouse name: Not on file   Number of children: 2   Years of education: 18   Highest education level: Not on file  Occupational History   Occupation: retired    Fish farm manager: [employer]   Tobacco Use   Smoking status: Former    Packs/day: 0.50    Years: 15.00    Pack years: 7.50    Types: Cigarettes    Quit date: 03/03/1998    Years since quitting: 22.8   Smokeless tobacco: Never   Tobacco comments:    smoked 1973- 2006, up to <  1  ppd  Vaping Use   Vaping Use: Never used  Substance and Sexual Activity   Alcohol use: No    Alcohol/week: 0.0 standard drinks   Drug use: No   Sexual activity: Yes    Partners: Male    Comment: 1st intercourse- 32, partners- 2, married- 73 yrs   Other Topics Concern   Not on file  Social History Narrative   HAS REGULAR EXERCISE   DAILY CAFFEINE: 1-2 CUPS   Patient is right handed.   Social Determinants of Health   Financial Resource Strain: Not on file  Food Insecurity: Not on file  Transportation Needs: Not on file  Physical Activity: Not on file  Stress: Not on file  Social Connections: Not on file    Additional Social History: as above  Allergies:   Allergies  Allergen Reactions   Adhesive [Tape] Rash   Albuterol     heartburn   Cymbalta [Duloxetine Hcl]     Diarrhea, nausea, anxiety worse, shaky   Latex Other (See Comments)   Lyrica [Pregabalin] Other (See Comments)    Sedation   Neurontin [Gabapentin] Other (See Comments)    Sedation   Nitrofuran Derivatives Other (See Comments)    "tingling"   Paxil [Paroxetine Hcl]     Bowel upset, tingling   Prozac [Fluoxetine Hcl] Other (See Comments)    Made patient worse and constipation    Metabolic Disorder Labs: Lab Results  Component Value Date   HGBA1C 6.1 10/15/2020   No results found for: PROLACTIN Lab Results  Component Value Date   CHOL 222 (H) 10/15/2020   TRIG 70.0 10/15/2020   HDL 80.50 10/15/2020   CHOLHDL 3 10/15/2020   VLDL 14.0 10/15/2020   LDLCALC 128 (H) 10/15/2020   LDLCALC 139 (H) 04/27/2020   Lab Results  Component Value Date   TSH 3.13 12/26/2019    Therapeutic Level Labs: No results found for: LITHIUM No results found for:  CBMZ No results found for: VALPROATE  Current Medications: Current Outpatient Medications  Medication Sig Dispense Refill   clonazePAM (KLONOPIN) 0.5 MG tablet TAKE 1 TABLET (0.5 MG TOTAL) BY MOUTH 2 (TWO) TIMES DAILY. FOR ANXIETY 60 tablet 0   [START ON 01/16/2021] clonazePAM (KLONOPIN) 0.5 MG tablet Take 1 tablet (0.5 mg total) by mouth daily as needed for anxiety. 30 tablet 0   eszopiclone (LUNESTA) 2 MG TABS tablet TAKE 1 TABLET (2 MG TOTAL) BY MOUTH AT BEDTIME AS NEEDED FOR SLEEP. TAKE IMMEDIATELY BEFORE BEDTIME 30 tablet 0   pantoprazole (PROTONIX) 40 MG tablet Take 1 tablet (40 mg total) by mouth 2 (two) times daily. After one month decrease to once daily 60 tablet 5   venlafaxine XR (EFFEXOR XR) 150 MG 24 hr capsule Take 1 capsule (150 mg total) by mouth daily with breakfast. 90 capsule 1   [START ON 01/01/2021] venlafaxine XR (EFFEXOR-XR) 75 MG 24 hr capsule Take 3 capsules (225 mg total) by mouth daily with breakfast. 90 capsule 0   AMBULATORY NON FORMULARY MEDICATION Bone Up 2 tablets twice a day  (Patient not taking: No sig reported)     Azelastine-Fluticasone 137-50 MCG/ACT SUSP Place 2 sprays into the nose daily. (Patient not taking: No sig reported)     bimatoprost (LATISSE) 0.03 % ophthalmic solution Place one drop on applicator and apply evenly along the skin of the eyebrows once daily at bedtime (Patient not taking: Reported on 12/31/2020) 3 mL 3   Cyanocobalamin (VITAMIN B-12 PO) Take 1 tablet by  mouth daily. (Patient not taking: Reported on 12/31/2020)     metoprolol tartrate (LOPRESSOR) 50 MG tablet Take 1 tablet (50 mg total) by mouth once for 1 dose. Please take 2 hours before your Cardiac CTA procedure 1 tablet 0   ondansetron (ZOFRAN ODT) 4 MG disintegrating tablet Take 1 tablet (4 mg total) by mouth every 8 (eight) hours as needed for nausea. (Patient not taking: No sig reported) 15 tablet 0   Probiotic Product (ALIGN) 4 MG CAPS Take 1 capsule by mouth as needed. (Patient  not taking: Reported on 12/31/2020)     Sod Picosulfate-Mag Ox-Cit Acd (CLENPIQ) 10-3.5-12 MG-GM -GM/160ML SOLN Clenpiq 10 mg-3.5 gram-12 gram/160 mL oral solution (Patient not taking: No sig reported)     tretinoin (RETIN-A) 0.025 % cream APPLY 1 APPLICATION TO AFFECTED AREA AT BEDTIME (Patient not taking: Reported on 10/16/2020)  99   vitamin C (ASCORBIC ACID) 500 MG tablet Take 500 mg by mouth daily. (Patient not taking: Reported on 10/16/2020)     No current facility-administered medications for this visit.    Musculoskeletal: Strength & Muscle Tone:  N/A Gait & Station:  N/A Patient leans: N/A  Psychiatric Specialty Exam: Review of Systems  Psychiatric/Behavioral:  Positive for confusion, decreased concentration, dysphoric mood and sleep disturbance. Negative for agitation, behavioral problems, hallucinations, self-injury and suicidal ideas. The patient is nervous/anxious. The patient is not hyperactive.   All other systems reviewed and are negative.  Blood pressure 110/76, pulse 86, height 5\' 8"  (1.727 m), weight 134 lb (60.8 kg), SpO2 98 %.Body mass index is 20.37 kg/m.  General Appearance: Fairly Groomed  Eye Contact:  Fair  Speech:  Clear and Coherent  Volume:  Normal  Mood:  Depressed  Affect:  Appropriate, Congruent, and Restricted  Thought Process:  Coherent, but needs frequent redirection  Orientation:  Full (Time, Place, and Person)  Thought Content:  Logical  Suicidal Thoughts:  No  Homicidal Thoughts:  No  Memory:  Immediate;   Poor  Judgement:  Good  Insight:  Present  Psychomotor Activity:  Decreased  Concentration:  Concentration: Good and Attention Span: Good  Recall:  Good  Fund of Knowledge:Good  Language: Good  Akathisia:  No  Handed:  Right  AIMS (if indicated):  not done  Assets:  Communication Skills Desire for Improvement  ADL's:  Intact  Cognition: WNL  Sleep:  Poor   Screenings: GAD-7    Flowsheet Row Office Visit from 06/20/2020 in [facility] at [facility]  Total GAD-7 Score 19      PHQ2-9    [facility] Visit from 12/31/2020 in [facility] Office Visit from 06/20/2020 in [place] at [place] Visit from 12/26/2019 in [facility] at [facility] Visit from 10/20/2017 in [facility] from 12/16/2016 in [facility]  PHQ-2 Total Score 6 6 6 2  0  PHQ-9 Total Score 19 20 16 10  --       Assessment and Plan:  [name] is a 75 y.o. year old female with a history of depression, anxiety, hyperlipidemia, Obstructive sleep apnea, hiatal hernia, GERD, gastritis, Schatzkes ring s/p dilatations , who is referred for depression.   1. MDD (major depressive disorder), recurrent episode, moderate ([place]) Exam is notable for decreased psychomotor activity, and patient is frequent instruction during the visit.  Psychosocial stressors includes loss of her grandson 2 years ago.  She has great support from her  husband at home.  Will uptitrate venlafaxine to optimize treatment for depression and anxiety.  Will consider adding or switching bupropion if she has limited benefit from this medication.  Will lower the frequency of clonazepam for anxiety given she reports limited benefit from this medication.  Will continue Lunesta at the current dose at this time for insomnia.  She will greatly benefit from CBT in the future.  Will consider referral to therapy once she is able to engage well during the visit.   Plan Increase venlafaxine to 225 mg daily Decrease clonazepam 0.5 mg daily as needed for anxiety Continue Lunesta 2 mg at night as needed for insomnia Next appointment: 12/1 at 3:30 for 30 mins, in person   The patient demonstrates the following risk factors for suicide: Chronic risk factors for suicide include: psychiatric disorder of depression . Acute risk factors for suicide include:  loss (financial, interpersonal, professional). Protective factors for this patient include: positive social support. Considering these factors, the overall suicide risk at this point appears to be low. Patient is appropriate for outpatient follow up.        Norman Clay, MD 10/31/20224:22 PM

## 2020-12-28 ENCOUNTER — Encounter: Payer: 59 | Admitting: Internal Medicine

## 2020-12-31 ENCOUNTER — Ambulatory Visit (INDEPENDENT_AMBULATORY_CARE_PROVIDER_SITE_OTHER): Payer: 59 | Admitting: Psychiatry

## 2020-12-31 ENCOUNTER — Encounter: Payer: Self-pay | Admitting: Psychiatry

## 2020-12-31 ENCOUNTER — Other Ambulatory Visit: Payer: Self-pay

## 2020-12-31 ENCOUNTER — Other Ambulatory Visit: Payer: Self-pay | Admitting: Internal Medicine

## 2020-12-31 VITALS — BP 110/76 | HR 86 | Ht 68.0 in | Wt 134.0 lb

## 2020-12-31 DIAGNOSIS — F331 Major depressive disorder, recurrent, moderate: Secondary | ICD-10-CM

## 2020-12-31 MED ORDER — VENLAFAXINE HCL ER 75 MG PO CP24: 225.0000 mg | ORAL_CAPSULE | Freq: Every day | ORAL | 0 refills | Status: DC

## 2020-12-31 MED ORDER — CLONAZEPAM 0.5 MG PO TABS: 0.5000 mg | ORAL_TABLET | Freq: Every day | ORAL | 0 refills | Status: DC | PRN

## 2021-01-01 ENCOUNTER — Ambulatory Visit: Payer: 59 | Admitting: Internal Medicine

## 2021-01-01 ENCOUNTER — Ambulatory Visit (INDEPENDENT_AMBULATORY_CARE_PROVIDER_SITE_OTHER)
Admission: RE | Admit: 2021-01-01 | Discharge: 2021-01-01 | Disposition: A | Discharge status: Home / Self Care | Payer: 59 | Source: Ambulatory Visit | Attending: Internal Medicine | Admitting: Internal Medicine

## 2021-01-01 ENCOUNTER — Encounter: Payer: Self-pay | Admitting: Internal Medicine

## 2021-01-01 VITALS — BP 110/68 | HR 67 | Ht 68.0 in | Wt 133.4 lb

## 2021-01-01 DIAGNOSIS — N941 Unspecified dyspareunia: Secondary | ICD-10-CM

## 2021-01-01 DIAGNOSIS — K5902 Outlet dysfunction constipation: Secondary | ICD-10-CM | POA: Diagnosis not present

## 2021-01-01 DIAGNOSIS — K582 Mixed irritable bowel syndrome: Secondary | ICD-10-CM | POA: Diagnosis not present

## 2021-01-01 DIAGNOSIS — R32 Unspecified urinary incontinence: Secondary | ICD-10-CM | POA: Diagnosis not present

## 2021-01-01 DIAGNOSIS — R1032 Left lower quadrant pain: Secondary | ICD-10-CM | POA: Diagnosis not present

## 2021-01-01 NOTE — Progress Notes (Signed)
[name] 75 y.o. 1945-03-06 [id number]  Assessment & Plan:   Encounter Diagnoses  Name Primary?   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  We will do an abdominal film with respect to her concerned about being obstructed.  I told her I think that is highly unlikely.  I think she has an outlet dysfunction problem and pelvic floor issues.  Not only does she have these with respect to bowel function but also with genitourinary function.  She will increase to 2 tablespoons of Metamucil twice a day  She can take senna +1-2 every other night to promote more regular defecation  If these measures fail she can add a dose of MiraLAX daily  She is being referred to pelvic floor physical therapy in [place] and referred to Dr. Wannetta Sender of urogynecology for evaluation and treatment as well.  Note lymphocytic colitis seems quiescent as constipation is her issue now.  I think IBS is her main underlying issue and the pelvic floor problems.  Follow-up with me pending the above  CC: Burns, Claudina Lick, MD   Subjective:   Chief Complaint: Constipation  HPI 75 year old white woman with a diagnosis of IBS previously followed by Dr. Sharlett Iles and then followed by me starting around 2017.  She has a previous history of lymphocytic colitis as well she has been treated with budesonide at times.  She presents today complaining of a worsening of constipation and difficulty evacuating.  She can go several days without having a bowel movement.  Husband is present and participates in the history.  She has been using 1-1/2 teaspoons of Metamucil twice a day and occasionally taking senna plus when she does not defecate.  She may go up to 4 to 5 days without a bowel movement  and then will take the senna plus.  However she still struggles to produce a stool and its frequently small balls of stool.  She does sound like she has adequate fluid intake throughout the day.  She believes she has enough fiber in the diet though much of it may be in the form of fruit.  Ondansetron is on her medication list but not used very often.  Other problems include urge and stress urinary incontinence.  She also has dyspareunia and says that she had a partial hysterectomy years ago and has had problems "there ever since".  She is very concerned she has a blockage and wants me to exclude that.  Colonoscopy December 09, 2019 at which point she was having a lot of diarrhea Small colonic polyps s/p polypectomy.  Both were adenomatous -Moderate sigmoid diverticulosis. -Otherwise normal colonoscopy to TI. -S/P random colonic and TI biopsies.  TI biopsies were normal colon biopsies demonstrated lymphocytic colitis   She was here in follow-up after this colonoscopy last fall and her diarrhea had resolved and I had recommended fiber supplementation and had recommended she try pelvic floor physical therapy because she has dyssynergic defecation and weak anal sphincter tone noted on exam in the past and on anorectal manometry in 2017.  She has declined that multiple times. Allergies  Allergen Reactions   Adhesive [Tape] Rash   Albuterol     heartburn   Cymbalta [Duloxetine Hcl]     Diarrhea, nausea, anxiety  worse, shaky   Latex Other (See Comments)   Lyrica [Pregabalin] Other (See Comments)    Sedation   Neurontin [Gabapentin] Other (See Comments)    Sedation   Nitrofuran Derivatives Other (See Comments)    "tingling"   Paxil [Paroxetine Hcl]     Bowel upset, tingling   Prozac [Fluoxetine Hcl] Other (See Comments)    Made patient worse and constipation   Current Meds  Medication Sig   AMBULATORY NON FORMULARY MEDICATION Bone Up 2 tablets twice a day   Azelastine-Fluticasone 137-50  MCG/ACT SUSP Place 2 sprays into the nose daily.   bimatoprost (LATISSE) 0.03 % ophthalmic solution Place one drop on applicator and apply evenly along the skin of the eyebrows once daily at bedtime   [START ON 01/16/2021] clonazePAM (KLONOPIN) 0.5 MG tablet Take 1 tablet (0.5 mg total) by mouth daily as needed for anxiety.   Cyanocobalamin (VITAMIN B-12 PO) Take 1 tablet by mouth daily.   eszopiclone (LUNESTA) 2 MG TABS tablet TAKE 1 TABLET (2 MG TOTAL) BY MOUTH AT BEDTIME AS NEEDED FOR SLEEP. TAKE IMMEDIATELY BEFORE BEDTIME   METAMUCIL FIBER PO Take by mouth in the morning and at bedtime.   ondansetron (ZOFRAN ODT) 4 MG disintegrating tablet Take 1 tablet (4 mg total) by mouth every 8 (eight) hours as needed for nausea.   pantoprazole (PROTONIX) 40 MG tablet Take 1 tablet (40 mg total) by mouth 2 (two) times daily. After one month decrease to once daily   Probiotic Product (ALIGN) 4 MG CAPS Take 1 capsule by mouth as needed.   Sennosides-Docusate Sodium (SENNA PLUS) 8.6-50 MG CAPS Take by mouth.   tretinoin (RETIN-A) 0.025 % cream    venlafaxine XR (EFFEXOR-XR) 75 MG 24 hr capsule Take 3 capsules (225 mg total) by mouth daily with breakfast.   vitamin C (ASCORBIC ACID) 500 MG tablet Take 500 mg by mouth daily.   Past Medical History:  Diagnosis Date   Allergy    Anemia    Anxiety    Asthma    Cataract    bil cateracts removed   Complication of anesthesia    Depression    generalized anxiety disorder   Diverticulosis    Duodenal diverticulum    Family history of adverse reaction to anesthesia    Mother and Daughters- N/V   Fibromyalgia    Gastroesophageal reflux disease with hiatal hernia    Hiatal hernia    History of kidney stones    Hyperlipidemia    IBS (irritable bowel syndrome)    Lymphocytic colitis    Dr Sharlett Iles   Migraine headache    Mitral valve prolapse    Neuropathy    Osteopenia    BMD ordered by GYN   Osteoporosis    Peripheral neuropathy    treated as RLS  by  Neurology   PONV (postoperative nausea and vomiting)    Restless leg syndrome    Schatzki's ring    History of   Sleep apnea    On CPAP, has not been using   Spondylosis    Past Surgical History:  Procedure Laterality Date   ANAL RECTAL MANOMETRY N/A 11/14/2015   Procedure: ANO RECTAL MANOMETRY;  Surgeon: Gatha Mayer, MD;  [facility];  Service: Endoscopy;  Laterality: N/A;   BREAST ENHANCEMENT SURGERY     CATARACT EXTRACTION Bilateral    cataract surgery Left    Dr Bing Plume   CHOLECYSTECTOMY     COLONOSCOPY  ESOPHAGEAL DILATION     X 2   ROTATOR CUFF REPAIR Left    SINUS ENDO W/FUSION N/A 05/30/2014   Procedure: REVISION  FRONTAL SINUS SURGERY WITH FUSION SCAN;  Surgeon: Jerrell Belfast, MD;  Location: [facility];  Service: ENT;  Laterality: N/A;   SINUS SURGERY WITH INSTATRAK     x 2   TUBAL LIGATION     UPPER GASTROINTESTINAL ENDOSCOPY     Vaginal cystectomy     x 2   VAGINAL HYSTERECTOMY  05/2007   Vaginal repair, Dr Ubaldo Glassing.  Partial  hysterectomy.   Social History   Social History Narrative   HAS REGULAR EXERCISE   DAILY CAFFEINE: 1-2 CUPS   Patient is right handed.   family history includes Anemia in her mother; Coronary artery disease in her brother; Diabetes in her maternal uncle; Heart attack in her father and paternal uncle; Hypertension in her mother; Hypertension (age of onset: 41) in her father; Neuropathy in her mother; Stroke (age of onset: 64) in her father.   Review of Systems As per HPI  Objective:   Physical Exam BP 110/68   Pulse 67   Ht 5\' 8"  (1.727 m)   Wt 133 lb 6 oz (60.5 kg)   BMI 20.28 kg/m  Minimal LLQ tenderness but benign abdomen

## 2021-01-01 NOTE — Patient Instructions (Signed)
Go to the basement today for an abdominal x-ray before leaving.  We have placed a referral for pelvic floor P.T. and also a referral to Dr Sherlene Shams.    Take 2 tablespoons of Metamucil daily and use 1-2 Senokot every other night. If that doesn't work add a dose of miralax daily   I appreciate the opportunity to care for you. Silvano Rusk, MD, [facility]

## 2021-01-04 ENCOUNTER — Telehealth: Payer: Self-pay | Admitting: Internal Medicine

## 2021-01-04 NOTE — Telephone Encounter (Signed)
I spoke with [name] and she misunderstood Dr Tommas Olp office. She does want to see them. I spoke with the Dr's office and they will reach back out to her today.

## 2021-01-04 NOTE — Telephone Encounter (Signed)
Inbound call from patient states Dr. Wannetta Sender office called and states they do not handle treatment for her problem on the referral.

## 2021-01-08 ENCOUNTER — Encounter: Payer: Self-pay | Admitting: Internal Medicine

## 2021-01-08 NOTE — Patient Instructions (Addendum)
Flu immunization administered today.     Medications changes include :     Take venlafaxine 75 mg alternating with  150 mg for one week Take venlafaxine 75 mg daily for one week Take venlafaxine 75 mg alternating with 37.5 mg for one week Then venlafaxine 37.5 mg daily for one week and then every other day for one week then stop  Stop the lunesta.   Try sonata 10 mg for sleep.  Increase clonazepam to twice a day   Your prescription(s) have been submitted to your pharmacy. Please take as directed and contact our office if you believe you are having problem(s) with the medication(s).   Please followup in 2-3 months    Health Maintenance, Female Adopting a healthy lifestyle and getting preventive care are important in promoting health and wellness. Ask your health care provider about: The right schedule for you to have regular tests and exams. Things you can do on your own to prevent diseases and keep yourself healthy. What should I know about diet, weight, and exercise? Eat a healthy diet  Eat a diet that includes plenty of vegetables, fruits, low-fat dairy products, and lean protein. Do not eat a lot of foods that are high in solid fats, added sugars, or sodium. Maintain a healthy weight Body mass index (BMI) is used to identify weight problems. It estimates body fat based on height and weight. Your health care provider can help determine your BMI and help you achieve or maintain a healthy weight. Get regular exercise Get regular exercise. This is one of the most important things you can do for your health. Most adults should: Exercise for at least 150 minutes each week. The exercise should increase your heart rate and make you sweat (moderate-intensity exercise). Do strengthening exercises at least twice a week. This is in addition to the moderate-intensity exercise. Spend less time sitting. Even light physical activity can be beneficial. Watch cholesterol and blood  lipids Have your blood tested for lipids and cholesterol at 77 years of age, then have this test every 5 years. Have your cholesterol levels checked more often if: Your lipid or cholesterol levels are high. You are older than 77 years of age. You are at high risk for heart disease. What should I know about cancer screening? Depending on your health history and family history, you may need to have cancer screening at various ages. This may include screening for: Breast cancer. Cervical cancer. Colorectal cancer. Skin cancer. Lung cancer. What should I know about heart disease, diabetes, and high blood pressure? Blood pressure and heart disease High blood pressure causes heart disease and increases the risk of stroke. This is more likely to develop in people who have high blood pressure readings or are overweight. Have your blood pressure checked: Every 3-5 years if you are 29-49 years of age. Every year if you are 62 years old or older. Diabetes Have regular diabetes screenings. This checks your fasting blood sugar level. Have the screening done: Once every three years after age 65 if you are at a normal weight and have a low risk for diabetes. More often and at a younger age if you are overweight or have a high risk for diabetes. What should I know about preventing infection? Hepatitis B If you have a higher risk for hepatitis B, you should be screened for this virus. Talk with your health care provider to find out if you are at risk for hepatitis B infection. Hepatitis C Testing is recommended  for: Everyone born from [birth year] through 1967. Anyone with known risk factors for hepatitis C. Sexually transmitted infections (STIs) Get screened for STIs, including gonorrhea and chlamydia, if: You are sexually active and are younger than 77 years of age. You are older than 77 years of age and your health care provider tells you that you are at risk for this type of infection. Your sexual  activity has changed since you were last screened, and you are at increased risk for chlamydia or gonorrhea. Ask your health care provider if you are at risk. Ask your health care provider about whether you are at high risk for HIV. Your health care provider may recommend a prescription medicine to help prevent HIV infection. If you choose to take medicine to prevent HIV, you should first get tested for HIV. You should then be tested every 3 months for as long as you are taking the medicine. Pregnancy If you are about to stop having your period (premenopausal) and you may become pregnant, seek counseling before you get pregnant. Take 400 to 800 micrograms (mcg) of folic acid every day if you become pregnant. Ask for birth control (contraception) if you want to prevent pregnancy. Osteoporosis and menopause Osteoporosis is a disease in which the bones lose minerals and strength with aging. This can result in bone fractures. If you are 32 years old or older, or if you are at risk for osteoporosis and fractures, ask your health care provider if you should: Be screened for bone loss. Take a calcium or vitamin D supplement to lower your risk of fractures. Be given hormone replacement therapy (HRT) to treat symptoms of menopause. Follow these instructions at home: Alcohol use Do not drink alcohol if: Your health care provider tells you not to drink. You are pregnant, may be pregnant, or are planning to become pregnant. If you drink alcohol: Limit how much you have to: 0-1 drink a day. Know how much alcohol is in your drink. In the [place] one drink equals one 12 oz bottle of beer (355 mL), one 5 oz glass of wine (148 mL), or one 1 oz glass of hard liquor (44 mL). Lifestyle Do not use any products that contain nicotine or tobacco. These products include cigarettes, chewing tobacco, and vaping devices, such as e-cigarettes. If you need help quitting, ask your health care provider. Do not use street  drugs. Do not share needles. Ask your health care provider for help if you need support or information about quitting drugs. General instructions Schedule regular health, dental, and eye exams. Stay current with your vaccines. Tell your health care provider if: You often feel depressed. You have ever been abused or do not feel safe at home. Summary Adopting a healthy lifestyle and getting preventive care are important in promoting health and wellness. Follow your health care provider's instructions about healthy diet, exercising, and getting tested or screened for diseases. Follow your health care provider's instructions on monitoring your cholesterol and blood pressure. This information is not intended to replace advice given to you by your health care provider. Make sure you discuss any questions you have with your health care provider. Document Revised: 07/09/2020 Document Reviewed: 07/09/2020 Elsevier Patient Education  [place].

## 2021-01-08 NOTE — Progress Notes (Signed)
Subjective:    Patient ID: [name], female    DOB: [birth year], 75 y.o.   MRN: [id number]   This visit occurred during the SARS-CoV-2 public health emergency.  Safety protocols were in place, including screening questions prior to the visit, additional usage of staff PPE, and extensive cleaning of exam room while observing appropriate contact time as indicated for disinfecting solutions.    HPI She is here for a physical exam.   She has established with a psychiatrist - Dr. Norman Clay.  Her medication was adjusted recently.  She did not feel like the psychiatrist was a good fit for her  A few days after she increased the dose of venlafaxine to 225 mg she started having increased anxiety, hallucinations and headaches. She decreased to 150 mg x 1 day and then went down to 75 mg daily.  Last night she was having a nightmare.  She is experiencing nausea, lightheadedness, severe headaches.    Medications and allergies reviewed with patient and updated if appropriate.  Patient Active Problem List   Diagnosis Date Noted   Easy bruising 10/15/2020   Moderate protein malnutrition ([employer]) 12/26/2019   Nausea 09/16/2019   S/P ORIF (open reduction internal fixation) fracture, left hip 09/16/2019   Hair loss 07/21/2019   Grief 03/25/2019   DDD (degenerative disc disease), cervical 09/24/2018   Pain in left knee 03/17/2018   Conductive hearing loss, bilateral 03/10/2018   Prediabetes 12/17/2017   Neck pain 04/28/2017   Dizziness 02/26/2017   B12 deficiency 12/16/2016   Back pain 05/07/2016   Asthma 04/22/2016   Family history of diabetes mellitus (DM) 12/07/2015   Dyssynergic defecation    Obstructive sleep apnea 07/12/2015   Seasonal and perennial allergic rhinitis 07/12/2015   Chest pain 04/26/2015   Vitamin D deficiency 12/05/2014   Rhinitis, chronic 05/30/2014   Vaginal atrophy 10/20/2012   Insomnia 10/20/2012   Orthostatic hypotension 02/16/2012   Cough  02/13/2011   Palpitations 12/31/2010   GERD (gastroesophageal reflux disease) 07/25/2010   Lymphocytic colitis 06/14/2010   Depression    Osteoporosis 12/27/2009   SOB (shortness of breath) 06/25/2009   Hereditary and idiopathic peripheral neuropathy 12/22/2007   Hyperlipidemia 03/09/2007   Generalized anxiety disorder 03/09/2007   MIGRAINE HEADACHE 03/09/2007   IBS 03/09/2007   SPONDYLOSIS 03/09/2007   SCHATZKI'S RING, HX OF 03/09/2007   Diaphragmatic hernia 12/30/2006   DIVERTICULOSIS OF COLON 01/01/2000    Current Outpatient Medications on File Prior to Visit  Medication Sig Dispense Refill   AMBULATORY NON FORMULARY MEDICATION Bone Up 2 tablets twice a day     Azelastine-Fluticasone 137-50 MCG/ACT SUSP Place 2 sprays into the nose daily.     bimatoprost (LATISSE) 0.03 % ophthalmic solution Place one drop on applicator and apply evenly along the skin of the eyebrows once daily at bedtime 3 mL 3   Cyanocobalamin (VITAMIN B-12 PO) Take 1 tablet by mouth daily.     METAMUCIL FIBER PO Take by mouth in the morning and at bedtime.     ondansetron (ZOFRAN ODT) 4 MG disintegrating tablet Take 1 tablet (4 mg total) by mouth every 8 (eight) hours as needed for nausea. 15 tablet 0   pantoprazole (PROTONIX) 40 MG tablet Take 1 tablet (40 mg total) by mouth 2 (two) times daily. After one month decrease to once daily 60 tablet 5   Probiotic Product (ALIGN) 4 MG CAPS Take 1 capsule by mouth as needed.     Sennosides-Docusate Sodium (  SENNA PLUS) 8.6-50 MG CAPS Take by mouth.     tretinoin (RETIN-A) 0.025 % cream   99   vitamin C (ASCORBIC ACID) 500 MG tablet Take 500 mg by mouth daily.     [DISCONTINUED] colesevelam (WELCHOL) 625 MG tablet Take 625 mg by mouth 2 (two) times daily with a meal. Take one tablet by mouth twice a day     [DISCONTINUED] Hyoscyamine-Phenyltoloxamine (DIGEX NF) 1.6109-60 MG CAPS Take one tablet by mouth as needed for abdominal cramping 24 each 0   No current  facility-administered medications on file prior to visit.    Past Medical History:  Diagnosis Date   Allergy    Anemia    Anxiety    Asthma    Cataract    bil cateracts removed   Complication of anesthesia    Depression    generalized anxiety disorder   Diverticulosis    Duodenal diverticulum    Family history of adverse reaction to anesthesia    Mother and Daughters- N/V   Fibromyalgia    Gastroesophageal reflux disease with hiatal hernia    Hiatal hernia    History of kidney stones    Hyperlipidemia    IBS (irritable bowel syndrome)    Lymphocytic colitis    Dr Sharlett Iles   Migraine headache    Mitral valve prolapse    Neuropathy    Osteopenia    BMD ordered by GYN   Osteoporosis    Peripheral neuropathy    treated as RLS by  Neurology   PONV (postoperative nausea and vomiting)    Restless leg syndrome    Schatzki's ring    History of   Sleep apnea    On CPAP, has not been using   Spondylosis     Past Surgical History:  Procedure Laterality Date   ANAL RECTAL MANOMETRY N/A 11/14/2015   Procedure: ANO RECTAL MANOMETRY;  Surgeon: Gatha Mayer, MD;  Location: [facility];  Service: Endoscopy;  Laterality: N/A;   BREAST ENHANCEMENT SURGERY     CATARACT EXTRACTION Bilateral    cataract surgery Left    Dr Bing Plume   CHOLECYSTECTOMY     COLONOSCOPY     ESOPHAGEAL DILATION     X 2   ROTATOR CUFF REPAIR Left    SINUS ENDO W/FUSION N/A 05/30/2014   Procedure: REVISION  FRONTAL SINUS SURGERY WITH FUSION SCAN;  Surgeon: Jerrell Belfast, MD;  Location: [facility];  Service: ENT;  Laterality: N/A;   SINUS SURGERY WITH INSTATRAK     x 2   TUBAL LIGATION     UPPER GASTROINTESTINAL ENDOSCOPY     Vaginal cystectomy     x 2   VAGINAL HYSTERECTOMY  05/2007   Vaginal repair, Dr Ubaldo Glassing.  Partial  hysterectomy.    Social History   Socioeconomic History   Marital status: Married    Spouse name: Not on file   Number of children: 2   Years of education: 18   Highest education  level: Not on file  Occupational History   Occupation: retired    Fish farm manager: [employer]  Tobacco Use   Smoking status: Former    Packs/day: 0.50    Years: 15.00    Pack years: 7.50    Types: Cigarettes    Quit date: 03/03/1998    Years since quitting: 22.8   Smokeless tobacco: Never   Tobacco comments:    smoked 1973- 2006, up to <  1  ppd  Vaping Use  Vaping Use: Never used  Substance and Sexual Activity   Alcohol use: No    Alcohol/week: 0.0 standard drinks   Drug use: No   Sexual activity: Yes    Partners: Male    Comment: 1st intercourse- 59, partners- 2, married- 22 yrs   Other Topics Concern   Not on file  Social History Narrative   HAS REGULAR EXERCISE   DAILY CAFFEINE: 1-2 CUPS   Patient is right handed.   Social Determinants of Health   Financial Resource Strain: Not on file  Food Insecurity: Not on file  Transportation Needs: Not on file  Physical Activity: Not on file  Stress: Not on file  Social Connections: Not on file    Family History  Problem Relation Age of Onset   Hypertension Father 85   Stroke Father 15   Heart attack Father         ? in 65s   Hypertension Mother    Neuropathy Mother    Anemia Mother    Coronary artery disease Brother        Stent placement in 63s   Diabetes Maternal Uncle    Heart attack Paternal Uncle        SEVEN , ? age   Colon cancer Neg Hx    Seizures Neg Hx    Esophageal cancer Neg Hx    Rectal cancer Neg Hx    Stomach cancer Neg Hx     Review of Systems  Constitutional:  Positive for chills and diaphoresis. Negative for fever.  Eyes:  Negative for visual disturbance.  Respiratory:  Negative for cough, shortness of breath and wheezing.   Cardiovascular:  Positive for palpitations. Negative for chest pain and leg swelling.  Gastrointestinal:  Positive for abdominal pain (constipation), constipation and nausea. Negative for blood in stool and diarrhea.       No gerd - controlled  Genitourinary:  Negative for  dysuria.  Musculoskeletal:  Positive for arthralgias, back pain and neck pain.  Skin:  Negative for rash.  Neurological:  Positive for dizziness, light-headedness, numbness and headaches.  Psychiatric/Behavioral:  Positive for dysphoric mood and sleep disturbance. The patient is nervous/anxious.       Objective:   Vitals:   01/09/21 1038  BP: 118/68  Pulse: (!) 53  Temp: 97.9 F (36.6 C)  SpO2: 99%   Filed Weights   01/09/21 1038  Weight: 126 lb 6.4 oz (57.3 kg)   Body mass index is 19.22 kg/m.  BP Readings from Last 3 Encounters:  01/09/21 118/68  01/01/21 110/68  12/31/20 110/76    Wt Readings from Last 3 Encounters:  01/09/21 126 lb 6.4 oz (57.3 kg)  01/01/21 133 lb 6 oz (60.5 kg)  12/31/20 134 lb (60.8 kg)     Physical Exam Constitutional: She appears well-developed and well-nourished. No distress.  HENT:  Head: Normocephalic and atraumatic.  Right Ear: External ear normal. Normal ear canal and TM Left Ear: External ear normal.  Normal ear canal and TM Mouth/Throat: Oropharynx is clear and moist.  Eyes: Conjunctivae and EOM are normal.  Neck: Neck supple. No tracheal deviation present. No thyromegaly present.  No carotid bruit  Cardiovascular: Normal rate, regular rhythm and normal heart sounds.   No murmur heard.  No edema. Pulmonary/Chest: Effort normal and breath sounds normal. No respiratory distress. She has no wheezes. She has no rales.  Breast: deferred   Abdominal: Soft. She exhibits no distension. There is no tenderness.  Lymphadenopathy: She has  no cervical adenopathy.  Skin: Skin is warm and dry. She is not diaphoretic.  Psychiatric: She has a normal mood and affect. Her behavior is normal.     Lab Results  Component Value Date   WBC 6.3 10/15/2020   HGB 14.3 10/15/2020   HCT 42.0 10/15/2020   PLT 273.0 10/15/2020   GLUCOSE 87 10/15/2020   CHOL 222 (H) 10/15/2020   TRIG 70.0 10/15/2020   HDL 80.50 10/15/2020   LDLCALC 128 (H)  10/15/2020   ALT 18 10/15/2020   AST 24 10/15/2020   NA 134 (L) 10/15/2020   K 4.1 10/15/2020   CL 98 10/15/2020   CREATININE 0.57 10/15/2020   BUN 13 10/15/2020   CO2 26 10/15/2020   TSH 3.13 12/26/2019   INR 1.08 01/09/2009   HGBA1C 6.1 10/15/2020   MICROALBUR 2.5 (H) 01/06/2011         Assessment & Plan:   Physical exam: Screening blood work  ordered Exercise  not regular Weight  normal  Substance abuse  none   Reviewed recommended immunizations.  Flu vaccine today  Dexa done by gyn   Health Maintenance  Topic Date Due   DEXA SCAN  12/17/2019   INFLUENZA VACCINE  10/01/2020   COVID-19 Vaccine (3 - Booster for Pfizer series) 01/25/2021 (Originally 03/30/2020)   TETANUS/TDAP  12/21/2028   Pneumonia Vaccine 92+ Years old  Completed   Hepatitis C Screening  Completed   Zoster Vaccines- Shingrix  Completed   HPV VACCINES  Aged Out          See Problem List for Assessment and Plan of chronic medical problems.

## 2021-01-09 ENCOUNTER — Ambulatory Visit (INDEPENDENT_AMBULATORY_CARE_PROVIDER_SITE_OTHER): Payer: 59 | Admitting: Internal Medicine

## 2021-01-09 ENCOUNTER — Other Ambulatory Visit: Payer: Self-pay

## 2021-01-09 VITALS — BP 118/68 | HR 53 | Temp 97.9°F | Ht 68.0 in | Wt 126.4 lb

## 2021-01-09 DIAGNOSIS — K219 Gastro-esophageal reflux disease without esophagitis: Secondary | ICD-10-CM

## 2021-01-09 DIAGNOSIS — M810 Age-related osteoporosis without current pathological fracture: Secondary | ICD-10-CM

## 2021-01-09 DIAGNOSIS — E538 Deficiency of other specified B group vitamins: Secondary | ICD-10-CM

## 2021-01-09 DIAGNOSIS — E782 Mixed hyperlipidemia: Secondary | ICD-10-CM

## 2021-01-09 DIAGNOSIS — F3289 Other specified depressive episodes: Secondary | ICD-10-CM

## 2021-01-09 DIAGNOSIS — R7303 Prediabetes: Secondary | ICD-10-CM

## 2021-01-09 DIAGNOSIS — E559 Vitamin D deficiency, unspecified: Secondary | ICD-10-CM

## 2021-01-09 DIAGNOSIS — F411 Generalized anxiety disorder: Secondary | ICD-10-CM

## 2021-01-09 DIAGNOSIS — F5104 Psychophysiologic insomnia: Secondary | ICD-10-CM

## 2021-01-09 DIAGNOSIS — F4321 Adjustment disorder with depressed mood: Secondary | ICD-10-CM

## 2021-01-09 DIAGNOSIS — Z0000 Encounter for general adult medical examination without abnormal findings: Secondary | ICD-10-CM

## 2021-01-09 MED ORDER — VENLAFAXINE HCL ER 37.5 MG PO CP24: 37.5000 mg | ORAL_CAPSULE | Freq: Every day | ORAL | 0 refills | Status: DC

## 2021-01-09 MED ORDER — CLONAZEPAM 0.5 MG PO TABS: 0.5000 mg | ORAL_TABLET | Freq: Two times a day (BID) | ORAL | 2 refills | Status: DC

## 2021-01-09 MED ORDER — ZALEPLON 10 MG PO CAPS: 10.0000 mg | ORAL_CAPSULE | Freq: Every evening | ORAL | 0 refills | Status: DC | PRN

## 2021-01-09 NOTE — Assessment & Plan Note (Signed)
Chronic Lab Results  Component Value Date   HGBA1C 6.1 10/15/2020   Sugars have been stable in the prediabetic range Will consider rechecking A1c at her next visit Encouraged regular exercise, healthy diet

## 2021-01-09 NOTE — Assessment & Plan Note (Addendum)
Chronic Has tried several medications over the past several months that have been ineffective or she has had side effects from including trazodone, Rozerem, Remeron, Seroquel, Lunesta  Will try Sonata 10 mg nightly

## 2021-01-09 NOTE — Assessment & Plan Note (Signed)
Chronic Take vitamin D daily

## 2021-01-09 NOTE — Assessment & Plan Note (Signed)
Chronic Continue B12 daily Will monitor level

## 2021-01-09 NOTE — Assessment & Plan Note (Signed)
Chronic Since the death of her grandson 2 years ago she has been experiencing significant depression, anxiety and grief She has tried several medications that have not been effective She recently saw psychiatry and her venlafaxine was increased, which made her feel worse and she really feels this has been causing side effects since she started it and wants to get off of it She is having some withdrawal symptoms since going down on the dose quickly Alternate 150 mg and 75 mg for 1 week and then decrease to 75 mg for 1 week, then alternate 75 and 37.5 mg for 1 week and then go down to 37.5 mg daily for 1 week and then every other day for 1 week and then stop Discussed that some of the symptoms she is experiencing are likely withdrawal from the medication including nausea, headaches, lightheadedness and she may need to taper down on the medication slower depending on her symptoms We will get her completely off the medication and then reassess Follow-up in 2-3 months, sooner if needed

## 2021-01-09 NOTE — Assessment & Plan Note (Signed)
Chronic Not ideally controlled Clonazepam decreased by psychiatry to once a day and this is 1 thing that she thinks is helping-increase back up to twice daily

## 2021-01-09 NOTE — Assessment & Plan Note (Signed)
Chronic GERD controlled Continue pantoprazole 40 mg twice daily 

## 2021-01-09 NOTE — Assessment & Plan Note (Signed)
Chronic DEXA due-this is done by her GYN and she will have that done when she sees her gynecologist She is trying to get back into regular exercise Continue calcium and vitamin D daily

## 2021-01-09 NOTE — Assessment & Plan Note (Signed)
Chronic Since her grandson died she has been experiencing significant grief She would benefit from getting back to see a therapist on a regular basis

## 2021-01-09 NOTE — Assessment & Plan Note (Signed)
Chronic Regular exercise and healthy diet encouraged Check lipid panel  Diet controlled Had a calcium score of 0 in 2016

## 2021-02-04 ENCOUNTER — Telehealth: Payer: Self-pay | Admitting: Internal Medicine

## 2021-02-04 ENCOUNTER — Ambulatory Visit: Payer: Self-pay | Admitting: Psychiatry

## 2021-02-04 NOTE — Telephone Encounter (Signed)
Patient called.  She states she fell and is afraid "she's hurt her hiatal hernia."  She wanted to know if you would refer her for an X-ray or MRI or whatever is needed to make sure it's OK.  If we are not the ones who would send her for that, she would like to know who she should call to request that.  Please call patient and advise.  Thank you.

## 2021-02-04 NOTE — Telephone Encounter (Signed)
Pt was encouraged to reach out to her PCP in regard to her recent fall. Pt verbalized understanding with all questions answered

## 2021-02-11 ENCOUNTER — Other Ambulatory Visit: Payer: Self-pay | Admitting: Internal Medicine

## 2021-02-12 ENCOUNTER — Telehealth: Payer: Self-pay | Admitting: Cardiovascular Disease

## 2021-02-12 NOTE — Telephone Encounter (Signed)
.   ° °  Pt c/o of Chest Pain: STAT if CP now or developed within 24 hours  1. Are you having CP right now? Yes s/p fall around thanksgiving   2. Are you experiencing any other symptoms (ex. SOB, nausea, vomiting, sweating)?   Patient reports fall around Thanksgiving resulting in broken bone in leg and soreness in chest breast rib area.   Patient still continues to have pain and wants to know if hernia injury could be pressing on heart .    3. How long have you been experiencing CP? Since fall   4. Is your CP continuous or coming and going? Continuous   5. Have you taken Nitroglycerin? No  ?

## 2021-02-12 NOTE — Telephone Encounter (Signed)
Was able to return call to [name], advised Dr. Rockey Situ reviewed her phone note, believes her hiatal hernia is not compressing or compromising her heart. Reports multiple cardiac work-up for chronic CP w/o significance findings, current chest pain/soreness could be related to her recent fall, bruised up around rib area can mimic CP, also would consider f/u with GI regarding her hiatal hernia.  Recommended cold and heat therapy for soreness/pain and OTC Tylenol & IBU (sparingly d/t risk of bleeding). Rib pain, fractures, and bruising can take weeks to heal and recover.  [name] verbalized understanding and very thankful for return call and advice. Will call back with anything further and call her GI provider.

## 2021-02-13 ENCOUNTER — Ambulatory Visit: Payer: 59 | Admitting: Obstetrics and Gynecology

## 2021-03-08 ENCOUNTER — Other Ambulatory Visit: Payer: Self-pay | Admitting: Internal Medicine

## 2021-03-12 ENCOUNTER — Telehealth: Payer: Self-pay | Admitting: Internal Medicine

## 2021-03-12 NOTE — Telephone Encounter (Signed)
I can not increase her current sleep medication.  She has tried 5 other sleep medications that have not worked so I am not sure what else to give her at this point

## 2021-03-12 NOTE — Telephone Encounter (Signed)
Patient calling in  Patient says she has been sick about 5-6 days.. neg covid test 03/10/21 but having covid/flu like symptoms (sore throat, chills, body aches)  Wants a cb from nurse w/ possible OTC recommendations [phone]

## 2021-03-13 NOTE — Telephone Encounter (Signed)
Patient notified medication dosage increase is denied.

## 2021-03-29 ENCOUNTER — Ambulatory Visit: Payer: 59 | Admitting: Internal Medicine

## 2021-04-08 ENCOUNTER — Other Ambulatory Visit: Payer: Self-pay | Admitting: Internal Medicine

## 2021-04-09 ENCOUNTER — Other Ambulatory Visit: Payer: 59

## 2021-04-12 ENCOUNTER — Ambulatory Visit: Payer: 59 | Admitting: Internal Medicine

## 2021-06-03 ENCOUNTER — Ambulatory Visit: Payer: 59 | Admitting: Professional

## 2021-06-17 ENCOUNTER — Encounter: Payer: Self-pay | Admitting: Professional

## 2021-06-17 ENCOUNTER — Ambulatory Visit (INDEPENDENT_AMBULATORY_CARE_PROVIDER_SITE_OTHER): Payer: 59 | Admitting: Professional

## 2021-06-17 DIAGNOSIS — F331 Major depressive disorder, recurrent, moderate: Secondary | ICD-10-CM | POA: Insufficient documentation

## 2021-06-17 DIAGNOSIS — F411 Generalized anxiety disorder: Secondary | ICD-10-CM

## 2021-06-17 NOTE — Progress Notes (Signed)
[facility] Counselor Initial Adult Exam ? ?Name: [name] ?Date: 06/17/2021 ?MRN: [id number] ?DOB: [birth year] ?PCP: [name], MD ? ?Time spent: 44 minutes 1001-1045am ? ?Guardian/Payee:  self   ? ?Paperwork requested: No  ? ?Reason for Visit /Presenting Problem: This session was held via telephonic teletherapy due to the coronavirus risk at this time. The patient consented to teletherapy and was located at her home during this session. She is aware it is the responsibility of the patient to secure confidentiality on her end of the session. The provider was in a private home office for the duration of this session.  ? ?The patient arrived on time for her phone appointment. ? ?The patient was referred by MD due to death of 61 year old grandson [name] on January 25, 2019; he has a [birth year] birthday. He was like a son to [place]. I have not been back to my MD but did not go because she lost her 80 year old daughter [name] on January 16th of this year. In June 2021 pt fell and broke her hip. On Apr 24, 2021 she fell and broke her wrist. Her daughter [name] is not doing well and is in treatment for depression and grief. ? ?Mental Status Exam: ?Appearance: NA   phone appointment ?Behavior: Sharing ?Motor: n/a ?Speech/Language: Clear and Coherent, Normal Rate, and Slow ?Affect: Flat ?Mood: depressed ?Thought process: normal ?Thought content: WNL ?Sensory/Perceptual disturbances: WNL ?Orientation: oriented to person, place, time/date, and situation ?Attention: Good ?Concentration: Good ?Memory: WNL ?Fund of knowledge: Good ?Insight: Good ?Judgment: Good ?Impulse Control: Good ? ?Risk Assessment: ?Danger to Self:  No ?Self-injurious Behavior: No ?Danger to Others: No ?Duty to Warn:no ?Physical Aggression / Violence:No  ?Access to Firearms a concern: No  ?Gang Involvement:No  ?Patient / guardian was educated about steps to take if suicide or homicide risk level increases between visits:  n/a ?While future psychiatric events cannot be accurately predicted, the patient does not currently require acute inpatient psychiatric care and does not currently meet [facility] involuntary commitment criteria. ? ?Substance Abuse History: ?Current substance abuse: No    ? ?Past Psychiatric History:   ?Previous psychological history is significant for depression and grief ?Outpatient Providers:psychiatrist Dr. Lennice Sites; therapy in past with [name] ?History of Psych Hospitalization: No  ?Psychological Testing:  no   ? ?Abuse History:  ?Victim of: No.,    ?Report needed: No. ?Victim of Neglect:No. ?Perpetrator of none  ?Witness / Exposure to Domestic Violence: No   ?Protective Services Involvement: No  ?Witness to [employer] Violence:  No  ? ?Family History:  ?Family History  ?Problem Relation Age of Onset  ? Hypertension Father 46  ? Stroke Father 46  ? Heart attack Father   ?      ? in 4s  ? Hypertension Mother   ? Neuropathy Mother   ? Anemia Mother   ? Coronary artery disease Brother   ?     Stent placement in 50s  ? Diabetes Maternal Uncle   ? Heart attack Paternal Uncle   ?     SEVEN , ? age  ? Colon cancer Neg Hx   ? Seizures Neg Hx   ? Esophageal cancer Neg Hx   ? Rectal cancer Neg Hx   ? Stomach cancer Neg Hx   ? ? ?Living situation: the patient lives with their spouse ? ?Sexual Orientation: Straight ? ?Relationship Status: married x2 ?Name of spouse / other:[name], 28, she reports "he is real good to me",  the have been married for 25 years; first marriage was [name] she was married to him for ten years-husband was unfaithful, he neglected the bills ?If a parent, number of children / ages:two daughters, [name], deceased due to Covid at age 75; [name], 28, who lost her only child to an auto accident when she was 46 ? ?Support Systems: spouse [name] ?Friends [name], [name], and [name] ?Daughter [name] as much as she can be ? ?Financial Stress:  No   ? ?Income/Employment/Disability: [place], she had worked for forty years. She retired to keep her grandson after finding her job was moving to [employer] and she had no plans to sell her home and move. ? ?Military Service: No  ? ?Educational History: ?Education: high school diploma/GED was pregnant and stopped attending school after completion 11th grade. She only needed her senior english to graduate but had no one to keep her child so she got a GED. ? ?Religion/Sprituality/World View: ?Protestant, [employer] member currently looking for church since [employer] she attended broke up. ? ?Any cultural differences that may affect / interfere with treatment:  not applicable  ? ?Recreation/Hobbies: goes to beach, loves to work in yard, being with daughter and grandchildren, getting back into walking, enjoying talking to friends, likes to keep a clean house ? ?Stressors: Health problems   ?Loss of grandson in 2020 and daughter in January 2023.   ? ?Strengths: Supportive Relationships, Family, Friends, Spirituality, Conservator, museum/gallery, and Able to Communicate Effectively ? ?Barriers:  none  ? ?Legal History: ?Pending legal issue / charges: The patient has no significant history of legal issues. ?History of legal issue / charges: none ? ?Medical History/Surgical History: reviewed ?Past Medical History:  ?Diagnosis Date  ? Allergy   ? Anemia   ? Anxiety   ? Asthma   ? Cataract   ? bil cateracts removed  ? Complication of anesthesia   ? Depression   ? generalized anxiety disorder  ? Diverticulosis   ? Duodenal diverticulum   ? Family history of adverse reaction to anesthesia   ? Mother and Daughters- N/V  ? Fibromyalgia   ? Gastroesophageal reflux disease with hiatal hernia   ? Hiatal hernia   ? History of kidney stones   ? Hyperlipidemia   ? IBS (irritable bowel syndrome)   ? Lymphocytic colitis   ? Dr Sharlett Iles  ? Migraine headache   ? Mitral valve prolapse   ? Neuropathy   ? Osteopenia   ? BMD ordered by GYN  ?  Osteoporosis   ? Peripheral neuropathy   ? treated as RLS by  Neurology  ? PONV (postoperative nausea and vomiting)   ? Restless leg syndrome   ? Schatzki's ring   ? History of  ? Sleep apnea   ? On CPAP, has not been using  ? Spondylosis   ? ? ?Past Surgical History:  ?Procedure Laterality Date  ? ANAL RECTAL MANOMETRY N/A 11/14/2015  ? Procedure: ANO RECTAL MANOMETRY;  Surgeon: Gatha Mayer, MD;  Location: [facility];  Service: Endoscopy;  Laterality: N/A;  ? BREAST ENHANCEMENT SURGERY    ? CATARACT EXTRACTION Bilateral   ? cataract surgery Left   ? Dr Bing Plume  ? CHOLECYSTECTOMY    ? COLONOSCOPY    ? ESOPHAGEAL DILATION    ? X 2  ? ROTATOR CUFF REPAIR Left   ? SINUS ENDO W/FUSION N/A 05/30/2014  ? Procedure: REVISION  FRONTAL SINUS SURGERY WITH FUSION SCAN;  Surgeon: Jerrell Belfast, MD;  Location: [place];  Service: ENT;  Laterality: N/A;  ? SINUS SURGERY WITH INSTATRAK    ? x 2  ? TUBAL LIGATION    ? UPPER GASTROINTESTINAL ENDOSCOPY    ? Vaginal cystectomy    ? x 2  ? VAGINAL HYSTERECTOMY  05/2007  ? Vaginal repair, Dr Ubaldo Glassing.  Partial  hysterectomy.  ? ? ?Medications: ?Current Outpatient Medications  ?Medication Sig Dispense Refill  ? AMBULATORY NON FORMULARY MEDICATION Bone Up 2 tablets twice a day    ? Azelastine-Fluticasone 137-50 MCG/ACT SUSP Place 2 sprays into the nose daily.    ? bimatoprost (LATISSE) 0.03 % ophthalmic solution Place one drop on applicator and apply evenly along the skin of the eyebrows once daily at bedtime 3 mL 3  ? clonazePAM (KLONOPIN) 0.5 MG tablet Take 1 tablet (0.5 mg total) by mouth 2 (two) times daily. 60 tablet 2  ? Cyanocobalamin (VITAMIN B-12 PO) Take 1 tablet by mouth daily.    ? METAMUCIL FIBER PO Take by mouth in the morning and at bedtime.    ? ondansetron (ZOFRAN ODT) 4 MG disintegrating tablet Take 1 tablet (4 mg total) by mouth every 8 (eight) hours as needed for nausea. 15 tablet 0  ? pantoprazole (PROTONIX) 40 MG tablet Take 1 tablet (40 mg total) by mouth 2 (two) times  daily. After one month decrease to once daily 60 tablet 5  ? Probiotic Product (ALIGN) 4 MG CAPS Take 1 capsule by mouth as needed.    ? Sennosides-Docusate Sodium (SENNA PLUS) 8.6-50 MG CAPS Take by mouth.    ? tretinoin (RETIN-A) 0.02

## 2021-06-17 NOTE — Progress Notes (Signed)
° ° ° ° ° ° ° ° ° ° ° ° ° ° °  Aryahna Spagna, LCMHC °

## 2021-06-25 ENCOUNTER — Other Ambulatory Visit (INDEPENDENT_AMBULATORY_CARE_PROVIDER_SITE_OTHER): Payer: 59

## 2021-06-25 DIAGNOSIS — R7303 Prediabetes: Secondary | ICD-10-CM | POA: Diagnosis not present

## 2021-06-25 DIAGNOSIS — E559 Vitamin D deficiency, unspecified: Secondary | ICD-10-CM

## 2021-06-25 DIAGNOSIS — E782 Mixed hyperlipidemia: Secondary | ICD-10-CM | POA: Diagnosis not present

## 2021-06-25 DIAGNOSIS — M810 Age-related osteoporosis without current pathological fracture: Secondary | ICD-10-CM

## 2021-06-25 DIAGNOSIS — E538 Deficiency of other specified B group vitamins: Secondary | ICD-10-CM | POA: Diagnosis not present

## 2021-06-25 LAB — CBC WITH DIFFERENTIAL/PLATELET
Basophils Absolute: 0.1 10*3/uL (ref 0.0–0.1)
Basophils Relative: 1.1 % (ref 0.0–3.0)
Eosinophils Absolute: 0.1 10*3/uL (ref 0.0–0.7)
Eosinophils Relative: 2.5 % (ref 0.0–5.0)
HCT: 39.8 % (ref 36.0–46.0)
Hemoglobin: 13.2 g/dL (ref 12.0–15.0)
Lymphocytes Relative: 23.5 % (ref 12.0–46.0)
Lymphs Abs: 1.3 10*3/uL (ref 0.7–4.0)
MCHC: 33.1 g/dL (ref 30.0–36.0)
MCV: 89.2 fl (ref 78.0–100.0)
Monocytes Absolute: 0.4 10*3/uL (ref 0.1–1.0)
Monocytes Relative: 7 % (ref 3.0–12.0)
Neutro Abs: 3.6 10*3/uL (ref 1.4–7.7)
Neutrophils Relative %: 65.9 % (ref 43.0–77.0)
Platelets: 261 10*3/uL (ref 150.0–400.0)
RBC: 4.46 Mil/uL (ref 3.87–5.11)
RDW: 15.7 % — ABNORMAL HIGH (ref 11.5–15.5)
WBC: 5.4 10*3/uL (ref 4.0–10.5)

## 2021-06-25 LAB — COMPREHENSIVE METABOLIC PANEL
ALT: 20 U/L (ref 0–35)
AST: 25 U/L (ref 0–37)
Albumin: 4.3 g/dL (ref 3.5–5.2)
Alkaline Phosphatase: 66 U/L (ref 39–117)
BUN: 14 mg/dL (ref 6–23)
CO2: 27 mEq/L (ref 19–32)
Calcium: 9.4 mg/dL (ref 8.4–10.5)
Chloride: 104 mEq/L (ref 96–112)
Creatinine, Ser: 0.6 mg/dL (ref 0.40–1.20)
GFR: 86.62 mL/min (ref >60.00)
Glucose, Bld: 88 mg/dL (ref 70–99)
Potassium: 3.8 mEq/L (ref 3.5–5.1)
Sodium: 139 mEq/L (ref 135–145)
Total Bilirubin: 0.6 mg/dL (ref 0.2–1.2)
Total Protein: 6.8 g/dL (ref 6.0–8.3)

## 2021-06-25 LAB — LIPID PANEL
Cholesterol: 193 mg/dL (ref 0–200)
HDL: 69.9 mg/dL (ref >39.00)
LDL Cholesterol: 110 mg/dL — ABNORMAL HIGH (ref 0–99)
NonHDL: 122.61
Total CHOL/HDL Ratio: 3
Triglycerides: 65 mg/dL (ref 0.0–149.0)
VLDL: 13 mg/dL (ref 0.0–40.0)

## 2021-06-25 LAB — VITAMIN B12: Vitamin B-12: 811 pg/mL (ref 211–911)

## 2021-06-25 LAB — VITAMIN D 25 HYDROXY (VIT D DEFICIENCY, FRACTURES): VITD: 38.55 ng/mL (ref 30.00–100.00)

## 2021-06-25 LAB — HEMOGLOBIN A1C: Hgb A1c MFr Bld: 6 % (ref 4.6–6.5)

## 2021-06-28 ENCOUNTER — Ambulatory Visit: Payer: 59 | Admitting: Internal Medicine

## 2021-06-30 ENCOUNTER — Encounter: Payer: Self-pay | Admitting: Internal Medicine

## 2021-06-30 NOTE — Patient Instructions (Addendum)
? ? ?  Follow up with Dr Rockey Situ ? ? ? ? ?Medications changes include :  Xopenex inhaler as needed  ? ? ?Your prescription(s) have been sent to your pharmacy.  ? ? ? ? ?Return in about 6 months (around 01/01/2022) for 6 month f/u. ? ?

## 2021-06-30 NOTE — Progress Notes (Signed)
? ? ? ? ?Subjective:  ? ? Patient ID: [name], female    DOB: 1945-06-04, 76 y.o.   MRN: [id number] ? ?This visit occurred during the SARS-CoV-2 public health emergency.  Safety protocols were in place, including screening questions prior to the visit, additional usage of staff PPE, and extensive cleaning of exam room while observing appropriate contact time as indicated for disinfecting solutions.   ? ? ?HPI ?[name] is here for follow up of her chronic medical problems, including anxiety, depression, insomnia, prediabetes, gerd, OP, hld, vitd def ? ?Her daughter died in 03/24/2022.   This was unexpected.  She is still grieving the death of her grandson.  She is seeing a psychiatrist.  She is started seeing a therapist, but is not sure if it will work out - she needs to see someone more often than her schedule allows and wants to see someone in person.   ? ? ?She has had spells of almost passing out.  She has had this previously, but she has had 2 episodes recently.  She had one at the doctor's office- she felt nauseous, lightheaded, blurry vision.  Two weeks ago she was sitting an doing her wrist exercises - she got nauseous, clammy, lightheaded, got hot and blurry vision. When she tried to get up her legs buckled.  Laying on the floor helped.  She has eaten and drank plenty of fluids prior to these episodes.  Her blood pressure and heart rate were not checked during these episodes.  She knows that would be ideal.  She has had longstanding low blood pressure and for the most part has been asymptomatic. ? ?Holter monitor done 2019.  Echocardiogram done 2020.  CT coronary calcium score done 2022. ? ?Medications and allergies reviewed with patient and updated if appropriate. ? ?Current Outpatient Medications on File Prior to Visit  ?Medication Sig Dispense Refill  ? citalopram (CELEXA) 10 MG tablet Take 10 mg by mouth at bedtime.    ? clonazePAM (KLONOPIN) 0.5 MG tablet Take 1 tablet (0.5 mg total)  by mouth 2 (two) times daily. 60 tablet 2  ? Cyanocobalamin (VITAMIN B-12 PO) Take 1 tablet by mouth daily.    ? METAMUCIL FIBER PO Take by mouth in the morning and at bedtime.    ? Probiotic Product (ALIGN) 4 MG CAPS Take 1 capsule by mouth as needed.    ? QUEtiapine (SEROQUEL) 25 MG tablet Take 25 mg by mouth 3 (three) times daily.    ? RESTASIS 0.05 % ophthalmic emulsion 1 drop 2 (two) times daily.    ? Sennosides-Docusate Sodium (SENNA PLUS) 8.6-50 MG CAPS Take by mouth.    ? tretinoin (RETIN-A) 0.025 % cream   99  ? vitamin C (ASCORBIC ACID) 500 MG tablet Take 500 mg by mouth daily.    ? [DISCONTINUED] colesevelam (WELCHOL) 625 MG tablet Take 625 mg by mouth 2 (two) times daily with a meal. Take one tablet by mouth twice a day    ? [DISCONTINUED] Hyoscyamine-Phenyltoloxamine ([place] NF) [id number] MG CAPS Take one tablet by mouth as needed for abdominal cramping 24 each 0  ? ?No current facility-administered medications on file prior to visit.  ? ? ? ?Review of Systems  ?Constitutional:  Negative for fever.  ?Respiratory:  Positive for shortness of breath (sometimes). Negative for cough and wheezing.   ?Cardiovascular:  Positive for palpitations (not new). Negative for chest pain.  ?Gastrointestinal:  Positive for nausea.  ?     [name]  controlled  ?Neurological:  Positive for light-headedness and headaches.  ?Psychiatric/Behavioral:  Positive for dysphoric mood and sleep disturbance. The patient is nervous/anxious.   ? ?   ?Objective:  ? ?Vitals:  ? 07/01/21 1401  ?BP: 106/72  ?Pulse: 65  ?Temp: 98 ?F (36.7 ?C)  ?SpO2: 99%  ? ?BP Readings from Last 3 Encounters:  ?07/01/21 106/72  ?01/09/21 118/68  ?01/01/21 110/68  ? ?Wt Readings from Last 3 Encounters:  ?07/01/21 132 lb (59.9 kg)  ?01/09/21 126 lb 6.4 oz (57.3 kg)  ?01/01/21 133 lb 6 oz (60.5 kg)  ? ?Body mass index is 20.07 kg/m?. ? ?  ?Physical Exam ?Constitutional:   ?   General: She is not in acute distress. ?   Appearance: Normal appearance.  ?HENT:  ?    Head: Normocephalic and atraumatic.  ?Eyes:  ?   Conjunctiva/sclera: Conjunctivae normal.  ?Cardiovascular:  ?   Rate and Rhythm: Normal rate and regular rhythm.  ?   Heart sounds: Normal heart sounds. No murmur heard. ?Pulmonary:  ?   Effort: Pulmonary effort is normal. No respiratory distress.  ?   Breath sounds: Normal breath sounds. No wheezing.  ?Musculoskeletal:  ?   Cervical back: Neck supple.  ?   Right lower leg: No edema.  ?   Left lower leg: No edema.  ?Lymphadenopathy:  ?   Cervical: No cervical adenopathy.  ?Skin: ?   General: Skin is warm and dry.  ?   Findings: No rash.  ?Neurological:  ?   Mental Status: She is alert. Mental status is at baseline.  ?Psychiatric:     ?   Behavior: Behavior normal.  ?   Comments: Depressed affect  ? ?   ? ?Lab Results  ?Component Value Date  ? WBC 5.4 06/25/2021  ? HGB 13.2 06/25/2021  ? HCT 39.8 06/25/2021  ? PLT 261.0 06/25/2021  ? GLUCOSE 88 06/25/2021  ? CHOL 193 06/25/2021  ? TRIG 65.0 06/25/2021  ? HDL 69.90 06/25/2021  ? LDLCALC 110 (H) 06/25/2021  ? ALT 20 06/25/2021  ? AST 25 06/25/2021  ? NA 139 06/25/2021  ? K 3.8 06/25/2021  ? CL 104 06/25/2021  ? CREATININE 0.60 06/25/2021  ? BUN 14 06/25/2021  ? CO2 27 06/25/2021  ? TSH 3.13 12/26/2019  ? INR 1.08 01/09/2009  ? HGBA1C 6.0 06/25/2021  ? MICROALBUR 2.5 (H) 01/06/2011  ? ? ? ?Assessment & Plan:  ? ? ?See Problem List for Assessment and Plan of chronic medical problems.  ? ? ?

## 2021-07-01 ENCOUNTER — Ambulatory Visit (INDEPENDENT_AMBULATORY_CARE_PROVIDER_SITE_OTHER): Payer: 59 | Admitting: Internal Medicine

## 2021-07-01 VITALS — BP 106/72 | HR 65 | Temp 98.0°F | Ht 68.0 in | Wt 132.0 lb

## 2021-07-01 DIAGNOSIS — R55 Syncope and collapse: Secondary | ICD-10-CM | POA: Insufficient documentation

## 2021-07-01 DIAGNOSIS — R7303 Prediabetes: Secondary | ICD-10-CM

## 2021-07-01 DIAGNOSIS — K219 Gastro-esophageal reflux disease without esophagitis: Secondary | ICD-10-CM | POA: Diagnosis not present

## 2021-07-01 DIAGNOSIS — M810 Age-related osteoporosis without current pathological fracture: Secondary | ICD-10-CM

## 2021-07-01 DIAGNOSIS — F411 Generalized anxiety disorder: Secondary | ICD-10-CM

## 2021-07-01 DIAGNOSIS — F3289 Other specified depressive episodes: Secondary | ICD-10-CM | POA: Diagnosis not present

## 2021-07-01 DIAGNOSIS — E782 Mixed hyperlipidemia: Secondary | ICD-10-CM

## 2021-07-01 DIAGNOSIS — E559 Vitamin D deficiency, unspecified: Secondary | ICD-10-CM

## 2021-07-01 MED ORDER — PANTOPRAZOLE SODIUM 40 MG PO TBEC: 40.0000 mg | DELAYED_RELEASE_TABLET | Freq: Two times a day (BID) | ORAL | 5 refills | Status: DC

## 2021-07-01 MED ORDER — ONDANSETRON 4 MG PO TBDP: 4.0000 mg | ORAL_TABLET | Freq: Three times a day (TID) | ORAL | 0 refills | Status: DC | PRN

## 2021-07-01 MED ORDER — LEVALBUTEROL TARTRATE 45 MCG/ACT IN AERO: 1.0000 | INHALATION_SPRAY | RESPIRATORY_TRACT | 12 refills | Status: DC | PRN

## 2021-07-01 NOTE — Assessment & Plan Note (Signed)
Chronic GERD controlled Continue pantoprazole 40 mg twice daily 

## 2021-07-01 NOTE — Assessment & Plan Note (Signed)
Chronic ?Vitamin D level good ?Continue vitamin D supplementation daily ?

## 2021-07-01 NOTE — Assessment & Plan Note (Signed)
Chronic ?She is due for DEXA scan-we will get done with her gynecologist ?Stressed that she needs to consider medication for her bones ?

## 2021-07-01 NOTE — Assessment & Plan Note (Signed)
Chronic Management per psychiatry 

## 2021-07-01 NOTE — Assessment & Plan Note (Signed)
Has had 2 recent episodes and has had previous episodes in the past ?Both occurred when she was sitting ?1 in the doctor's office and 1 at home ?States she had been in drank normally and does not feel it was dehydration or hypoglycemia ?Blood pressure and heart rate were not checked during these episodes ?Has had orthostatic hypotension in the past, but these episodes both occurred when sitting ??  Repeat Holter,?  Repeat echo ?We will have her follow-up with Dr. Rockey Situ to get his thoughts ?Continues to have palpitations, which are not new and have not changed ?She is under tremendous stress, which is not helping ?

## 2021-07-01 NOTE — Assessment & Plan Note (Signed)
Chronic ?Lipids well controlled ?Continue healthy diet ?

## 2021-07-01 NOTE — Assessment & Plan Note (Signed)
Chronic ?Lab Results  ?Component Value Date  ? HGBA1C 6.0 06/25/2021  ? ?Sugar stable-slightly better ?Continue low sugar/carbohydrate diet ?

## 2021-07-01 NOTE — Assessment & Plan Note (Signed)
Chronic ?Significant depression related to grieving the death of his grandson and more recently daughter ?She is following with a psychiatrist-management per them ?She has established with a therapist, but may change-would prefer to see someone in person ?

## 2021-07-19 NOTE — Progress Notes (Signed)
Date:  07/22/2021   ID:  [name], DOB 14-Feb-1946, MRN [id number]  Patient Location: Home Provider Location: Office/Clinic  PCP:  [name], MD  Cardiologist:  Rockey Situ Electrophysiologist:  None   Evaluation Performed:  Follow-Up Visit  Chief Complaint  Patient presents with   12 month follow up     Patient is grieving the loss os her daughter; the daughter passed away from [place] in 03-28-2021. Medications reviewed by the patient verbally.     [name] is a 76 y.o. female with  hyperlipidemia, fibromyalgia,  despression extensive GI disease including hiatal hernia, GERD, gastritis, Schatzkes ring s/p dilatations  CT coronary calcium score of 0 September 2016 Long history of chronic chest pain /angina  Cardiac CTA August 2022 minimal coronary calcification, nonobstructive disease Obstructive sleep apnea, difficulty tolerating her CPAP  Grieving after loss of daughter in 03/28/21 from respiratory distress, post COVID complications  Recent fall 04/2021, landed on wrist, wrist was clearly deformed Reports after she broke wrist, "passed out",  Able to pick herself up and sit in a chair until help arrived  Has been doing therapy In April 2023, finished exercise, was sitting, head felt funny, felt sick When husband tried to stand and move her to bedroom, Had LOC, Woke on kitchen floor, laid there for a while until she recovered  Since loss of her daughter, lots of laying in the bed with depression Has been working with a therapist, recent medication changes Weight stable compared to 2022 Not walking as much, feels tachypalpitations when trying to walk, some chest fullness  EKG personally reviewed by myself on todays visit Nsr rate 62  bpm no St or T wave changes  LOV 8/22 Cardiac CTA August 2022, minimal coronary calcification  Calcium score 52 Nonobstructive disease noted  Stress test 06/2019 Echo 12/2018  Long  history of chronic chest pain /angina  sometimes at rest sometimes with exertion  Lost grandson, 01/2019, motor vehicle accident  June 16th 2021 Was at [facility], over steps Broke hip Transported to emergency room In hospital 17 days  During surgery, drop in blood pressure Post procedure, low blood pressure, "unable to treat the pain, blood pressure was too low' Started on midodrine  Other past medical history reviewed Stress test 07/14/2017 Normal wall motion, good exercise tolerance Target heart rate achieved on stress test No indication of blockage based on findings   Chronic neuropathy in her lower extremities  Past Medical History:  Diagnosis Date   Allergy    Anemia    Anxiety    Asthma    Cataract    bil cateracts removed   Complication of anesthesia    Depression    generalized anxiety disorder   Diverticulosis    Duodenal diverticulum    Family history of adverse reaction to anesthesia    Mother and Daughters- N/V   Fibromyalgia    Gastroesophageal reflux disease with hiatal hernia    Hiatal hernia    History of kidney stones    Hyperlipidemia    IBS (irritable bowel syndrome)    Lymphocytic colitis    Dr Sharlett Iles   Migraine headache    Mitral valve prolapse    Neuropathy    Osteopenia    BMD ordered by GYN   Osteoporosis    Peripheral neuropathy    treated as RLS by  Neurology   PONV (postoperative nausea and vomiting)    Restless leg syndrome  Schatzki's ring    History of   Sleep apnea    On CPAP, has not been using   Spondylosis    Past Surgical History:  Procedure Laterality Date   ANAL RECTAL MANOMETRY N/A 11/14/2015   Procedure: ANO RECTAL MANOMETRY;  Surgeon: Gatha Mayer, MD;  Location: [facility];  Service: Endoscopy;  Laterality: N/A;   BREAST ENHANCEMENT SURGERY     CATARACT EXTRACTION Bilateral    cataract surgery Left    Dr Bing Plume   CHOLECYSTECTOMY     COLONOSCOPY     ESOPHAGEAL DILATION     X 2    ROTATOR CUFF REPAIR Left    SINUS ENDO W/FUSION N/A 05/30/2014   Procedure: REVISION  FRONTAL SINUS SURGERY WITH FUSION SCAN;  Surgeon: Jerrell Belfast, MD;  Location: [facility];  Service: ENT;  Laterality: N/A;   SINUS SURGERY WITH INSTATRAK     x 2   TUBAL LIGATION     UPPER GASTROINTESTINAL ENDOSCOPY     Vaginal cystectomy     x 2   VAGINAL HYSTERECTOMY  05/2007   Vaginal repair, Dr Ubaldo Glassing.  Partial  hysterectomy.     Current Meds  Medication Sig   citalopram (CELEXA) 20 MG tablet Take 20 mg by mouth at bedtime.   clonazePAM (KLONOPIN) 0.5 MG tablet Take 1 tablet (0.5 mg total) by mouth 2 (two) times daily.   Cyanocobalamin (VITAMIN B-12 PO) Take 1 tablet by mouth daily.   levalbuterol (XOPENEX HFA) 45 MCG/ACT inhaler Inhale 1-2 puffs into the lungs every 4 (four) hours as needed for wheezing.   METAMUCIL FIBER PO Take by mouth in the morning and at bedtime.   ondansetron (ZOFRAN ODT) 4 MG disintegrating tablet Take 1 tablet (4 mg total) by mouth every 8 (eight) hours as needed for nausea.   pantoprazole (PROTONIX) 40 MG tablet Take 1 tablet (40 mg total) by mouth 2 (two) times daily.   Probiotic Product (ALIGN) 4 MG CAPS Take 1 capsule by mouth as needed.   QUEtiapine (SEROQUEL) 25 MG tablet Take 25 mg by mouth 3 (three) times daily.   RESTASIS 0.05 % ophthalmic emulsion 1 drop 2 (two) times daily.   Sennosides-Docusate Sodium (SENNA PLUS) 8.6-50 MG CAPS Take by mouth.   tretinoin (RETIN-A) 0.025 % cream    vitamin C (ASCORBIC ACID) 500 MG tablet Take 500 mg by mouth daily.     Allergies:   Adhesive [tape], Albuterol, Cymbalta [duloxetine hcl], Latex, Lyrica [pregabalin], Neurontin [gabapentin], Nitrofuran derivatives, Paxil [paroxetine hcl], and Prozac [fluoxetine hcl]   Social History   Tobacco Use   Smoking status: Former    Packs/day: 0.50    Years: 15.00    Pack years: 7.50    Types: Cigarettes    Quit date: 03/03/1998    Years since quitting: 23.4   Smokeless tobacco: Never    Tobacco comments:    smoked 1973- 2006, up to <  1  ppd  Vaping Use   Vaping Use: Never used  Substance Use Topics   Alcohol use: No    Alcohol/week: 0.0 standard drinks   Drug use: No     Family Hx: The patient's family history includes Anemia in her mother; Coronary artery disease in her brother; Diabetes in her maternal uncle; Heart attack in her father and paternal uncle; Hypertension in her mother; Hypertension (age of onset: 2) in her father; Neuropathy in her mother; Stroke (age of onset: 52) in her father. There is no history of  Colon cancer, Seizures, Esophageal cancer, Rectal cancer, or Stomach cancer.  ROS:   Please see the history of present illness.    Review of Systems  Constitutional: Negative.   HENT: Negative.    Respiratory: Negative.    Cardiovascular: Negative.   Gastrointestinal: Negative.   Musculoskeletal:  Positive for joint pain.  Neurological: Negative.   Psychiatric/Behavioral:  Positive for depression.   All other systems reviewed and are negative.   Labs/Other Tests and Data Reviewed:    EKG:  No ECG reviewed.  Recent Labs: 06/25/2021: ALT 20; BUN 14; Creatinine, Ser 0.60; Hemoglobin 13.2; Platelets 261.0; Potassium 3.8; Sodium 139   Recent Lipid Panel Lab Results  Component Value Date/Time   CHOL 193 06/25/2021 09:14 AM   CHOL 199 08/09/2014 10:05 AM   TRIG 65.0 06/25/2021 09:14 AM   TRIG 50 08/09/2014 10:05 AM   HDL 69.90 06/25/2021 09:14 AM   HDL 75 08/09/2014 10:05 AM   CHOLHDL 3 06/25/2021 09:14 AM   LDLCALC 110 (H) 06/25/2021 09:14 AM   LDLCALC 114 (H) 08/09/2014 10:05 AM    Wt Readings from Last 3 Encounters:  07/22/21 133 lb 8 oz (60.6 kg)  07/01/21 132 lb (59.9 kg)  01/09/21 126 lb 6.4 oz (57.3 kg)     Objective:    Vital Signs:  BP 90/70 (BP Location: Left Arm, Patient Position: Sitting, Cuff Size: Normal)   Pulse 62   Ht 5' 7.5" (1.715 m)   Wt 133 lb 8 oz (60.6 kg)   SpO2 94%   BMI 20.60 kg/m   Constitutional:   oriented to person, place, and time. No distress.  HENT:  Head: Grossly normal Eyes:  no discharge. No scleral icterus.  Neck: No JVD, no carotid bruits  Cardiovascular: Regular rate and rhythm, no murmurs appreciated Pulmonary/Chest: Clear to auscultation bilaterally, no wheezes or rails Abdominal: Soft.  no distension.  no tenderness.  Musculoskeletal: Normal range of motion Neurological:  normal muscle tone. Coordination normal. No atrophy Skin: Skin warm and dry Psychiatric: normal affect, pleasant  ASSESSMENT & PLAN:    Depression/anxiety/adjustment disorder Managed by primary care, Followed by therapist, recent loss of daughter, remote loss of grandson, both very tragic.  Long discussion concerning need to restart her walking program for sleep and conditioning   Chest pain, atypical cardiac CTA in 2022 with nonobstructive disease, minimal coronary calcification,  Suggested regular walking Long history of atypical chest pain, no further work-up at this time  Hip pain/hip fracture Has made full recovery   Hyperlipidemia, unspecified hyperlipidemia type -  Cardiac CTA with no significant disease   Palpitations -  Stable  Orthostatic hypotension Long hx, of relatively asymptomatic low blood pressure Previously managed with midodrine Recommend she increase her hydration, salt loading If she continues to have near syncope/syncope or orthostasis symptoms may need to add midodrine Chronically low blood pressure  Long discussion with her concerning managing grief  Total encounter time more than 40 minutes  Greater than 50% was spent in counseling and coordination of care with the patient     Signed, Ida Rogue, MD  07/22/2021 4:40 PM    [place]

## 2021-07-22 ENCOUNTER — Ambulatory Visit: Payer: 59 | Admitting: Cardiovascular Disease

## 2021-07-22 ENCOUNTER — Encounter: Payer: Self-pay | Admitting: Cardiovascular Disease

## 2021-07-22 VITALS — BP 90/70 | HR 62 | Ht 67.5 in | Wt 133.5 lb

## 2021-07-22 DIAGNOSIS — E782 Mixed hyperlipidemia: Secondary | ICD-10-CM | POA: Diagnosis not present

## 2021-07-22 DIAGNOSIS — F4320 Adjustment disorder, unspecified: Secondary | ICD-10-CM | POA: Diagnosis not present

## 2021-07-22 DIAGNOSIS — F3289 Other specified depressive episodes: Secondary | ICD-10-CM | POA: Diagnosis not present

## 2021-07-22 DIAGNOSIS — R55 Syncope and collapse: Secondary | ICD-10-CM | POA: Diagnosis not present

## 2021-07-22 NOTE — Patient Instructions (Signed)
Hydration, salting  Medication Instructions:  No changes  If you need a refill on your cardiac medications before your next appointment, please call your pharmacy.   Lab work: No new labs needed  Testing/Procedures: No new testing needed  Follow-Up: At [facility], you and your health needs are our priority.  As part of our continuing mission to provide you with exceptional heart care, we have created designated Provider Care Teams.  These Care Teams include your primary Cardiologist (physician) and Advanced Practice Providers (APPs -  Physician Assistants and Nurse Practitioners) who all work together to provide you with the care you need, when you need it.  You will need a follow up appointment in 12 months  Providers on your designated Care Team:   Murray Hodgkins, NP Christell Faith, PA-C Cadence Kathlen Mody, [place]  COVID-19 Vaccine Information can be found at: [email or web address].uk For questions related to vaccine distribution or appointments, please email [email or web address] or call [phone].

## 2021-07-24 ENCOUNTER — Ambulatory Visit: Payer: 59 | Admitting: Professional

## 2021-09-06 ENCOUNTER — Telehealth: Payer: Self-pay | Admitting: Internal Medicine

## 2021-09-06 ENCOUNTER — Other Ambulatory Visit: Payer: Self-pay

## 2021-09-06 DIAGNOSIS — M810 Age-related osteoporosis without current pathological fracture: Secondary | ICD-10-CM

## 2021-09-06 NOTE — Telephone Encounter (Signed)
Ordered today 

## 2021-09-06 NOTE — Telephone Encounter (Signed)
Pt called requesting orders for a bone density scan.   Please advise

## 2021-09-09 ENCOUNTER — Other Ambulatory Visit: Payer: Self-pay

## 2021-09-09 DIAGNOSIS — M810 Age-related osteoporosis without current pathological fracture: Secondary | ICD-10-CM

## 2021-09-09 DIAGNOSIS — Z1231 Encounter for screening mammogram for malignant neoplasm of breast: Secondary | ICD-10-CM

## 2021-09-09 NOTE — Telephone Encounter (Signed)
Orders redone and set up for [facility]

## 2021-09-19 ENCOUNTER — Ambulatory Visit: Payer: 59 | Admitting: Professional

## 2021-09-19 ENCOUNTER — Telehealth: Payer: Self-pay | Admitting: Internal Medicine

## 2021-09-19 NOTE — Telephone Encounter (Signed)
Inbound call from patient requesting a call back to discuss her issues with constipation. Patient is scheduled to see Dr. Carlean Purl on 8/24 and is seeking advice if she can do anything to help her in the meantime. Please advise.

## 2021-09-19 NOTE — Telephone Encounter (Signed)
Pt states that she is having constipation and seeking recommendations prior to her Office Visit on 8/24 to see Dr. Gessner:Pt states that her BM are balls. Last BM Pt was encouraged to drink lots of water, take miralx daily, OTC stool softner, Increase exercise: Pt verbalized understanding with all questions answered.

## 2021-10-03 ENCOUNTER — Telehealth: Payer: Self-pay | Admitting: Internal Medicine

## 2021-10-03 NOTE — Telephone Encounter (Signed)
Called pt sje states she need to see her or someone concerning her neuropathy. Inform pt due to insurance she will have to see first and if she feel she need referral will do at that time. Made appt 10/23/21 @ 10:40.Marland KitchenJohny Chess

## 2021-10-03 NOTE — Telephone Encounter (Signed)
I would recommend she call them to ask if she can set up an appt - it it has been a while since she was seen there she will need a new referral and neurology requires a note from me that discusses the reason for referral so she will need to come see me for the referral if they will not schedule her an appt

## 2021-10-03 NOTE — Telephone Encounter (Signed)
Pt was seeing a doctor at [facility]. She said the doctor she used to see is no longer there. Pt wanted to know if she can get another referral without being seen. She said she has hereditary peripheral neuropathy and would like to see [facility] again.   [facility] [address], [place], [place] [address] Telephone: [phone] Fax: [phone]    Please advise.

## 2021-10-08 LAB — HM DEXA SCAN

## 2021-10-09 ENCOUNTER — Ambulatory Visit (INDEPENDENT_AMBULATORY_CARE_PROVIDER_SITE_OTHER): Payer: 59 | Admitting: Family Medicine

## 2021-10-09 ENCOUNTER — Encounter: Payer: Self-pay | Admitting: Family Medicine

## 2021-10-09 VITALS — BP 106/70 | HR 62 | Temp 97.6°F | Ht 67.5 in | Wt 132.0 lb

## 2021-10-09 DIAGNOSIS — R252 Cramp and spasm: Secondary | ICD-10-CM

## 2021-10-09 DIAGNOSIS — G609 Hereditary and idiopathic neuropathy, unspecified: Secondary | ICD-10-CM

## 2021-10-09 LAB — COMPREHENSIVE METABOLIC PANEL
ALT: 14 U/L (ref 0–35)
AST: 21 U/L (ref 0–37)
Albumin: 4.1 g/dL (ref 3.5–5.2)
Alkaline Phosphatase: 59 U/L (ref 39–117)
BUN: 22 mg/dL (ref 6–23)
CO2: 30 mEq/L (ref 19–32)
Calcium: 9.2 mg/dL (ref 8.4–10.5)
Chloride: 101 mEq/L (ref 96–112)
Creatinine, Ser: 0.58 mg/dL (ref 0.40–1.20)
GFR: 87.16 mL/min (ref >60.00)
Glucose, Bld: 114 mg/dL — ABNORMAL HIGH (ref 70–99)
Potassium: 4.1 mEq/L (ref 3.5–5.1)
Sodium: 136 mEq/L (ref 135–145)
Total Bilirubin: 0.3 mg/dL (ref 0.2–1.2)
Total Protein: 6.6 g/dL (ref 6.0–8.3)

## 2021-10-09 LAB — MAGNESIUM: Magnesium: 1.9 mg/dL (ref 1.5–2.5)

## 2021-10-09 LAB — HM MAMMOGRAPHY

## 2021-10-09 LAB — TSH: TSH: 2.88 u[IU]/mL (ref 0.35–5.50)

## 2021-10-09 NOTE — Progress Notes (Signed)
Her labs are fine including electrolytes, kidney, liver and thyroid.  Her blood sugar was mildly elevated but this is normal if she had something to eat or drink prior to arrival this morning.

## 2021-10-09 NOTE — Progress Notes (Signed)
Subjective:     Patient ID: [name], female    DOB: [birth year], 76 y.o.   MRN: [id number]  Chief Complaint  Patient presents with   Peripheral Neuropathy    Referral to [facility] off [address]     HPI Patient is in today for peripheral neuropathy. She is a patient of Dr. Quay Burow but is seeing me today for a sooner referral request to neurology.  Complains of burning, numbness, tingling in her legs and feet. She also reports shaking in bilateral hands.   She saw Dr. Jannifer Franklin at [facility] in 12/2018 for same. Did not follow up.   States she has had 3 episodes of lower extremity muscle cramping at night in the past 2 weeks. States she drinks over the counter electrolyte beverages. This is new.   Per Dr. Tobey Grim note from 12/2018-Has not tolerated gabapentin, Lyrica or Cymbalta in the past.  Most recent treatment started in 2020 included carbamazepine. She was also recommended to have infrared light therapy through integrative therapies.  States she was going to therapy at [facility] as recommended.   Reports taking vitamin B12 and D supplements. Questions how much vitamin D she should be taking.   Denies fever, chills, headache, chest pain, palpitations, shortness of breath, abdominal pain, N/V/D, urinary symptoms, LE edema.      Health Maintenance Due  Topic Date Due   DEXA SCAN  12/17/2019   COVID-19 Vaccine (3 - Pfizer series) 03/30/2020   INFLUENZA VACCINE  10/01/2021    Past Medical History:  Diagnosis Date   Allergy    Anemia    Anxiety    Asthma    Cataract    bil cateracts removed   Complication of anesthesia    Depression    generalized anxiety disorder   Diverticulosis    Duodenal diverticulum    Family history of adverse reaction to anesthesia    Mother and Daughters- N/V   Fibromyalgia    Gastroesophageal reflux disease with hiatal hernia    Hiatal hernia    History of kidney stones    Hyperlipidemia    IBS  (irritable bowel syndrome)    Lymphocytic colitis    Dr Sharlett Iles   Migraine headache    Mitral valve prolapse    Neuropathy    Osteopenia    BMD ordered by GYN   Osteoporosis    Peripheral neuropathy    treated as RLS by  Neurology   PONV (postoperative nausea and vomiting)    Restless leg syndrome    Schatzki's ring    History of   Sleep apnea    On CPAP, has not been using   Spondylosis     Past Surgical History:  Procedure Laterality Date   ANAL RECTAL MANOMETRY N/A 11/14/2015   Procedure: ANO RECTAL MANOMETRY;  Surgeon: Gatha Mayer, MD;  Location: [facility];  Service: Endoscopy;  Laterality: N/A;   BREAST ENHANCEMENT SURGERY     CATARACT EXTRACTION Bilateral    cataract surgery Left    Dr Bing Plume   CHOLECYSTECTOMY     COLONOSCOPY     ESOPHAGEAL DILATION     X 2   ROTATOR CUFF REPAIR Left    SINUS ENDO W/FUSION N/A 05/30/2014   Procedure: REVISION  FRONTAL SINUS SURGERY WITH FUSION SCAN;  Surgeon: Jerrell Belfast, MD;  Location: [facility];  Service: ENT;  Laterality: N/A;   SINUS SURGERY WITH INSTATRAK     x 2   TUBAL  LIGATION     UPPER GASTROINTESTINAL ENDOSCOPY     Vaginal cystectomy     x 2   VAGINAL HYSTERECTOMY  05/2007   Vaginal repair, Dr Ubaldo Glassing.  Partial  hysterectomy.    Family History  Problem Relation Age of Onset   Hypertension Father 92   Stroke Father 58   Heart attack Father         ? in 66s   Hypertension Mother    Neuropathy Mother    Anemia Mother    Coronary artery disease Brother        Stent placement in 83s   Diabetes Maternal Uncle    Heart attack Paternal Uncle        SEVEN , ? age   Colon cancer Neg Hx    Seizures Neg Hx    Esophageal cancer Neg Hx    Rectal cancer Neg Hx    Stomach cancer Neg Hx     Social History   Socioeconomic History   Marital status: Married    Spouse name: Not on file   Number of children: 2   Years of education: 18   Highest education level: Not on file  Occupational History   Occupation:  retired    Fish farm manager: [employer]  Tobacco Use   Smoking status: Former    Packs/day: 0.50    Years: 15.00    Total pack years: 7.50    Types: Cigarettes    Quit date: 03/03/1998    Years since quitting: 23.6   Smokeless tobacco: Never   Tobacco comments:    smoked 1973- 2006, up to <  1  ppd  Vaping Use   Vaping Use: Never used  Substance and Sexual Activity   Alcohol use: No    Alcohol/week: 0.0 standard drinks of alcohol   Drug use: No   Sexual activity: Yes    Partners: Male    Comment: 1st intercourse- 66, partners- 2, married- 72 yrs   Other Topics Concern   Not on file  Social History Narrative   HAS REGULAR EXERCISE   DAILY CAFFEINE: 1-2 CUPS   Patient is right handed.   Social Determinants of Health   Financial Resource Strain: Not on file  Food Insecurity: Not on file  Transportation Needs: Not on file  Physical Activity: Not on file  Stress: Not on file  Social Connections: Not on file  Intimate Partner Violence: Not on file    Outpatient Medications Prior to Visit  Medication Sig Dispense Refill   citalopram (CELEXA) 20 MG tablet Take 20 mg by mouth at bedtime.     clonazePAM (KLONOPIN) 0.5 MG tablet Take 1 tablet (0.5 mg total) by mouth 2 (two) times daily. 60 tablet 2   Cyanocobalamin (VITAMIN B-12 PO) Take 1 tablet by mouth daily.     levalbuterol (XOPENEX HFA) 45 MCG/ACT inhaler Inhale 1-2 puffs into the lungs every 4 (four) hours as needed for wheezing. 1 each 12   METAMUCIL FIBER PO Take by mouth in the morning and at bedtime.     ondansetron (ZOFRAN ODT) 4 MG disintegrating tablet Take 1 tablet (4 mg total) by mouth every 8 (eight) hours as needed for nausea. 20 tablet 0   pantoprazole (PROTONIX) 40 MG tablet Take 1 tablet (40 mg total) by mouth 2 (two) times daily. 60 tablet 5   Probiotic Product (ALIGN) 4 MG CAPS Take 1 capsule by mouth as needed.     QUEtiapine (SEROQUEL) 25 MG tablet  Take 25 mg by mouth 3 (three) times daily.     RESTASIS 0.05 %  ophthalmic emulsion 1 drop 2 (two) times daily.     Sennosides-Docusate Sodium (SENNA PLUS) 8.6-50 MG CAPS Take by mouth.     tretinoin (RETIN-A) 0.025 % cream   99   vitamin C (ASCORBIC ACID) 500 MG tablet Take 500 mg by mouth daily.     citalopram (CELEXA) 10 MG tablet Take 10 mg by mouth at bedtime. (Patient not taking: Reported on 07/22/2021)     No facility-administered medications prior to visit.    Allergies  Allergen Reactions   Adhesive [Tape] Rash   Albuterol     heartburn   Cymbalta [Duloxetine Hcl]     Diarrhea, nausea, anxiety worse, shaky   Latex Other (See Comments)   Lyrica [Pregabalin] Other (See Comments)    Sedation   Neurontin [Gabapentin] Other (See Comments)    Sedation   Nitrofuran Derivatives Other (See Comments)    "tingling"   Paxil [Paroxetine Hcl]     Bowel upset, tingling   Prozac [Fluoxetine Hcl] Other (See Comments)    Made patient worse and constipation    ROS Pertinent positives and negatives in the history of present illness.     Objective:    Physical Exam Constitutional:      General: She is not in acute distress.    Appearance: She is not ill-appearing.  Eyes:     General: No visual field deficit.    Extraocular Movements: Extraocular movements intact.     Conjunctiva/sclera: Conjunctivae normal.     Pupils: Pupils are equal, round, and reactive to light.  Cardiovascular:     Rate and Rhythm: Normal rate and regular rhythm.     Pulses: Normal pulses.  Pulmonary:     Effort: Pulmonary effort is normal.     Breath sounds: Normal breath sounds.  Musculoskeletal:        General: Normal range of motion.     Cervical back: Normal range of motion and neck supple.     Right lower leg: No edema.     Left lower leg: No edema.  Skin:    General: Skin is warm and dry.     Capillary Refill: Capillary refill takes less than 2 seconds.  Neurological:     Mental Status: She is alert and oriented to person, place, and time.     Cranial  Nerves: No cranial nerve deficit or facial asymmetry.     Sensory: Sensory deficit present.     Motor: No abnormal muscle tone or pronator drift.     Coordination: Romberg sign negative. Coordination normal.     Gait: Gait is intact.     Deep Tendon Reflexes:     Reflex Scores:      Patellar reflexes are 1+ on the right side and 1+ on the left side.    Comments: Decreased sensation of bilateral feet with monofilament.   Psychiatric:        Mood and Affect: Mood normal.        Behavior: Behavior normal.        Thought Content: Thought content normal.     BP 106/70 (BP Location: Left Arm, Patient Position: Sitting, Cuff Size: Large)   Pulse 62   Temp 97.6 F (36.4 C) (Temporal)   Ht 5' 7.5" (1.715 m)   Wt 132 lb (59.9 kg)   SpO2 98%   BMI 20.37 kg/m  Wt Readings from Last 3  Encounters:  10/09/21 132 lb (59.9 kg)  07/22/21 133 lb 8 oz (60.6 kg)  07/01/21 132 lb (59.9 kg)       Assessment & Plan:   Problem List Items Addressed This Visit       Nervous and Auditory   Hereditary and idiopathic peripheral neuropathy - Primary   Relevant Orders   Ambulatory referral to Neurology   Other Visit Diagnoses     Muscle cramps at night       Relevant Orders   Comprehensive metabolic panel   TSH   Magnesium      Reviewed notes from [place] in 2020. Sensation deficit in feet to monofilament testing. Otherwise, no red flag neurological symptoms.  I will refer her back to [facility] per request.  Check labs for muscle cramps.  Follow up pending lab results.  Follow up with PCP as recommended.   Visit time 22 minutes in face to face communication with patient and coordination of care, additional 12 minutes spent in record review, coordination or care, ordering tests, communicating/referring to other healthcare professionals, documenting in medical records all on the same day of the visit for total time 34 minutes spent on the visit.    I am having [name] "Madaline Savage" maintain her Align, ascorbic acid, tretinoin, Cyanocobalamin (VITAMIN B-12 PO), Senna Plus, METAMUCIL FIBER PO, clonazePAM, QUEtiapine, citalopram, Restasis, levalbuterol, pantoprazole, ondansetron, and citalopram.  No orders of the defined types were placed in this encounter.

## 2021-10-09 NOTE — Patient Instructions (Signed)
Please go downstairs for labs before you leave today.  I have placed a referral to [facility] and they will call you to schedule a visit.

## 2021-10-11 ENCOUNTER — Telehealth: Payer: Self-pay | Admitting: Internal Medicine

## 2021-10-11 NOTE — Telephone Encounter (Signed)
Please let her know her bone density test shows she has osteoporosis and is at a high risk for having a fracture.    She really needs to start medication for her bones which we have discussed in the past.

## 2021-10-14 NOTE — Telephone Encounter (Signed)
(  FYI) Patient would like to hold off and discuss again at next OV.

## 2021-10-23 ENCOUNTER — Ambulatory Visit: Payer: 59 | Admitting: Internal Medicine

## 2021-10-24 ENCOUNTER — Encounter: Payer: Self-pay | Admitting: Internal Medicine

## 2021-10-24 ENCOUNTER — Ambulatory Visit (INDEPENDENT_AMBULATORY_CARE_PROVIDER_SITE_OTHER): Payer: 59 | Admitting: Internal Medicine

## 2021-10-24 VITALS — BP 90/60 | HR 64 | Ht 68.0 in | Wt 134.0 lb

## 2021-10-24 DIAGNOSIS — K5902 Outlet dysfunction constipation: Secondary | ICD-10-CM

## 2021-10-24 DIAGNOSIS — K582 Mixed irritable bowel syndrome: Secondary | ICD-10-CM | POA: Diagnosis not present

## 2021-10-24 DIAGNOSIS — R32 Unspecified urinary incontinence: Secondary | ICD-10-CM | POA: Diagnosis not present

## 2021-10-24 DIAGNOSIS — N941 Unspecified dyspareunia: Secondary | ICD-10-CM

## 2021-10-24 DIAGNOSIS — K52832 Lymphocytic colitis: Secondary | ICD-10-CM

## 2021-10-24 NOTE — Patient Instructions (Signed)
Take one dose of Miralax daily. Take one tablespoon of metamucil twice a day.  You have had lymphocytic colitis.   We have put in a referral for pelvic floor physical therapy in [place].    I appreciate the opportunity to care for you. Silvano Rusk, MD, [facility]

## 2021-10-24 NOTE — Progress Notes (Signed)
[name] 76 y.o. 10/06/1945 [id number]  Assessment & Plan:   Encounter Diagnoses  Name Primary?   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  We again had a discussion about pelvic floor physical therapy as we have had multiple times in the past.  She is agreeable to try it.  In the meantime she should continue with MiraLAX 1 dose daily and twice daily Metamucil 1 tablespoon dose.  I suspect that her depression is affecting concentration based upon the interview today and repeated questions about the same issues.  Unfortunately she has suffered the loss of her grandson and a daughter in the last 3 years.  This may have impact on her ability to effectively do PT but she has clear indications for that in my opinion and we should try it.  I appreciate PT assistance.  I have decided not to refer back to urogynecology at this point and we will hopefully be able to get 1 thing done.  CC: Heather Rail, MD   Subjective:   Chief Complaint: Constipation  HPI 76 year old woman with a history of IBS mixed predominantly constipation but also with history of lymphocytic colitis in the past, diminutive colon adenomas on last colonoscopy 2021, pelvic floor dysfunction/dyssynergic defecation and worsening depression who returns with recurrent constipation problems.  She was last seen 01/01/2021 and at that point was recommended to see urogynecology and pelvic PT.  Neither of those happened.  She has had orthopedic issues with fractures and also mental health problems and is seeing psychiatry for treatment of depression.  She has lost a grandson and a daughter in the past 2 to 3 years.  This is affected her  greatly.   She is using MiraLAX intermittently several days out of the week and 2 stool softeners and is better since she called our office in late July and we recommended this.  She has been on Metamucil but that either stopped working or did not work.  She does not recall the recommendations to see gynecology and physical therapy last year.  She does have urge urinary incontinence and chronic dyspareunia status post hysterectomy.  She says a prior gynecologist told her she did not need to return to gynecology and that there was nothing that could be done for the dyspareunia.  She asks me repeatedly what to take is MiraLAX safe should she take a stool softener should she take Metamucil.  She is seeing psychiatry and is having escalating doses of SSRI and is on Seroquel also.  HPI from office visit 01/01/2021 76 year old white woman with a diagnosis of IBS previously followed by [name] and then followed by me starting around 2017.  She has a previous history of lymphocytic colitis as well she has been treated with budesonide at times.  She presents today complaining of a worsening of constipation and difficulty evacuating.  She can go several days without having a bowel movement.  Husband is present and participates in the history.  She has been using 1-1/2 teaspoons of Metamucil twice a day and occasionally taking senna plus when she does not defecate.  She may go up to 4 to 5 days without a bowel movement and then will take the senna  plus.  However she still struggles to produce a stool and its frequently small balls of stool.  She does sound like she has adequate fluid intake throughout the day.  She believes she has enough fiber in the diet though much of it may be in the form of fruit.  Ondansetron is on her medication list but not used very often.  Other problems include urge and stress urinary incontinence.  She also has dyspareunia and says that she had a partial hysterectomy years ago and has  had problems "there ever since".   She is very concerned she has a blockage and wants me to exclude that.   Colonoscopy December 09, 2019 at which point she was having a lot of diarrhea Small colonic polyps s/p polypectomy.  Both were adenomatous -Moderate sigmoid diverticulosis. -Otherwise normal colonoscopy to TI. -S/P random colonic and TI biopsies.  TI biopsies were normal colon biopsies demonstrated lymphocytic colitis     She was here in follow-up after this colonoscopy last fall and her diarrhea had resolved and I had recommended fiber supplementation and had recommended she try pelvic floor physical therapy because she has dyssynergic defecation and weak anal sphincter tone noted on exam in the past and on anorectal manometry in 2017.  She has declined that multiple times.  Allergies  Allergen Reactions   Adhesive [Tape] Rash   Albuterol     heartburn   Cymbalta [Duloxetine Hcl]     Diarrhea, nausea, anxiety worse, shaky   Latex Other (See Comments)   Lyrica [Pregabalin] Other (See Comments)    Sedation   Neurontin [Gabapentin] Other (See Comments)    Sedation   Nitrofuran Derivatives Other (See Comments)    "tingling"   Paxil [Paroxetine Hcl]     Bowel upset, tingling   Prozac [Fluoxetine Hcl] Other (See Comments)    Made patient worse and constipation   Current Meds  Medication Sig   citalopram (CELEXA) 40 MG tablet Take 40 mg by mouth at bedtime.   clonazePAM (KLONOPIN) 0.5 MG tablet Take 1 tablet (0.5 mg total) by mouth 2 (two) times daily.   METAMUCIL FIBER PO Take by mouth in the morning and at bedtime.   pantoprazole (PROTONIX) 40 MG tablet Take 1 tablet (40 mg total) by mouth 2 (two) times daily.   Probiotic Product (ALIGN) 4 MG CAPS Take 1 capsule by mouth as needed.   QUEtiapine (SEROQUEL) 25 MG tablet Take 25 mg by mouth 3 (three) times daily.   RESTASIS 0.05 % ophthalmic emulsion 1 drop 2 (two) times daily.   vitamin C (ASCORBIC ACID) 500 MG tablet Take 500  mg by mouth daily.   [DISCONTINUED] Sennosides-Docusate Sodium (SENNA PLUS) 8.6-50 MG CAPS Take by mouth.   Past Medical History:  Diagnosis Date   Allergy    Anemia    Anxiety    Asthma    Cataract    bil cateracts removed   Complication of anesthesia    Depression    generalized anxiety disorder   Diverticulosis    Duodenal diverticulum    Family history of adverse reaction to anesthesia    Mother and Daughters- N/V   Fibromyalgia    Gastroesophageal reflux disease with hiatal hernia    Hiatal hernia    History of kidney stones    Hyperlipidemia    IBS (irritable bowel syndrome)    Lymphocytic colitis    Dr Sharlett [name]   Migraine headache    Mitral valve prolapse    Neuropathy  Osteopenia    BMD ordered by GYN   Osteoporosis    Peripheral neuropathy    treated as RLS by  Neurology   PONV (postoperative nausea and vomiting)    Restless leg syndrome    Schatzki's ring    History of   Sleep apnea    On CPAP, has not been using   Spondylosis    Past Surgical History:  Procedure Laterality Date   ANAL RECTAL MANOMETRY N/A 11/14/2015   Procedure: ANO RECTAL MANOMETRY;  Surgeon: Gatha Mayer, MD;  Location: [facility];  Service: Endoscopy;  Laterality: N/A;   BREAST ENHANCEMENT SURGERY     CATARACT EXTRACTION Bilateral    cataract surgery Left    Dr Bing Plume   CHOLECYSTECTOMY     COLONOSCOPY     ESOPHAGEAL DILATION     X 2   ROTATOR CUFF REPAIR Left    SINUS ENDO W/FUSION N/A 05/30/2014   Procedure: REVISION  FRONTAL SINUS SURGERY WITH FUSION SCAN;  Surgeon: Jerrell Belfast, MD;  Location: [facility];  Service: ENT;  Laterality: N/A;   SINUS SURGERY WITH INSTATRAK     x 2   TUBAL LIGATION     UPPER GASTROINTESTINAL ENDOSCOPY     Vaginal cystectomy     x 2   VAGINAL HYSTERECTOMY  05/2007   Vaginal repair, Dr Ubaldo Glassing.  Partial  hysterectomy.   Social History   Social History Narrative   HAS REGULAR EXERCISE   DAILY CAFFEINE: 1-2 CUPS   Patient is right handed.    family history includes Anemia in her mother; Coronary artery disease in her brother; Diabetes in her maternal uncle; Heart attack in her father and paternal uncle; Hypertension in her mother; Hypertension (age of onset: 32) in her father; Neuropathy in her mother; Stroke (age of onset: 9) in her father.   Review of Systems As per HPI  Objective:   Physical Exam BP 90/60 (BP Location: Right Arm, Patient Position: Sitting)   Pulse 64   Ht '5\' 8"'$  (1.727 m)   Wt 134 lb (60.8 kg)   BMI 20.37 kg/m  Thin well-developed well-nourished woman no acute distress Affect is somewhat flat Abdomen is thin soft and nontender, perhaps slightly tympanitic but no masses or organomegaly.

## 2021-10-24 NOTE — Progress Notes (Signed)
Outside notes received. Information abstracted. Notes sent to scan.  

## 2021-10-26 ENCOUNTER — Other Ambulatory Visit: Payer: Self-pay | Admitting: Internal Medicine

## 2021-11-11 NOTE — Progress Notes (Unsigned)
Date:  11/12/2021   ID:  [name], DOB 04/24/1945, MRN [id number]  Patient Location: Home Provider Location: Office/Clinic  PCP:  [name], MD  Cardiologist:  Rockey Situ Electrophysiologist:  None   Evaluation Performed:  Follow-Up Visit  Chief Complaint  Patient presents with   Follow-up    Patient c/o chest heaviness, shortness of breath and tingling all over her body. Medications reviewed by the patient verbally.     [name] is a 76 y.o. female with  hyperlipidemia, fibromyalgia,  despression extensive GI disease including hiatal hernia, GERD, gastritis, Schatzkes ring s/p dilatations  CT coronary calcium score of 0 September 2016 Long history of chronic chest pain /angina  Cardiac CTA August 2022 minimal coronary calcification, nonobstructive disease Obstructive sleep apnea, difficulty tolerating her CPAP  LOV 5/23 In follow-up today for several issues to discuss Fracture right foot, wearing a boot  Chronic heaviness in chest /episodes of SOB, Tingling in her body. Chest and legs Poor sleep, bad dreams, talking in her sleep On celexa/seroquel Concerned about side effects from depression medications Feels she is still having symptoms of anxiety  Significant stressors over the past year loss of daughter in January 2023 from respiratory distress, post COVID complications  Recovered from fall 04/2021, landed on wrist, broke her wrist Completed therapy  Weight stable No regular walking or exercise program secondary to foot  EKG personally reviewed by myself on todays visit Nsr rate 65  bpm no ST or T wave changes  Other past medical history reviewed Cardiac CTA August 2022, minimal coronary calcification  Calcium score 52 Nonobstructive disease noted  Stress test 06/2019 Echo 12/2018  Long history of chronic chest pain /angina  sometimes at rest sometimes with exertion  Lost grandson, 01/2019, motor vehicle  accident  June 16th 2021 Was at [place], over steps Broke hip Transported to emergency room In hospital 17 days  During surgery, drop in blood pressure Post procedure, low blood pressure, "unable to treat the pain, blood pressure was too low' Started on midodrine  Other past medical history reviewed Stress test 07/14/2017 Normal wall motion, good exercise tolerance Target heart rate achieved on stress test No indication of blockage based on findings   Chronic neuropathy in her lower extremities  Past Medical History:  Diagnosis Date   Allergy    Anemia    Anxiety    Asthma    Cataract    bil cateracts removed   Complication of anesthesia    Depression    generalized anxiety disorder   Diverticulosis    Duodenal diverticulum    Family history of adverse reaction to anesthesia    Mother and Daughters- N/V   Fibromyalgia    Gastroesophageal reflux disease with hiatal hernia    Hiatal hernia    History of kidney stones    Hyperlipidemia    IBS (irritable bowel syndrome)    Lymphocytic colitis    Dr Sharlett Iles   Migraine headache    Mitral valve prolapse    Neuropathy    Osteopenia    BMD ordered by GYN   Osteoporosis    Peripheral neuropathy    treated as RLS by  Neurology   PONV (postoperative nausea and vomiting)    Restless leg syndrome    Schatzki's ring    History of   Sleep apnea    On CPAP, has not been using   Spondylosis    Past Surgical History:  Procedure  Laterality Date   ANAL RECTAL MANOMETRY N/A 11/14/2015   Procedure: ANO RECTAL MANOMETRY;  Surgeon: Gatha Mayer, MD;  Location: [facility];  Service: Endoscopy;  Laterality: N/A;   BREAST ENHANCEMENT SURGERY     CATARACT EXTRACTION Bilateral    cataract surgery Left    Dr Bing Plume   CHOLECYSTECTOMY     COLONOSCOPY     ESOPHAGEAL DILATION     X 2   ROTATOR CUFF REPAIR Left    SINUS ENDO W/FUSION N/A 05/30/2014   Procedure: REVISION  FRONTAL SINUS SURGERY WITH FUSION SCAN;   Surgeon: Jerrell Belfast, MD;  Location: [facility];  Service: ENT;  Laterality: N/A;   SINUS SURGERY WITH INSTATRAK     x 2   TUBAL LIGATION     UPPER GASTROINTESTINAL ENDOSCOPY     Vaginal cystectomy     x 2   VAGINAL HYSTERECTOMY  05/2007   Vaginal repair, Dr Ubaldo Glassing.  Partial  hysterectomy.     Current Meds  Medication Sig   citalopram (CELEXA) 40 MG tablet Take 40 mg by mouth at bedtime.   Cyanocobalamin (VITAMIN B-12 PO) Take 1 tablet by mouth daily.   levalbuterol (XOPENEX HFA) 45 MCG/ACT inhaler Inhale 1-2 puffs into the lungs every 4 (four) hours as needed for wheezing.   METAMUCIL FIBER PO Take by mouth in the morning and at bedtime.   ondansetron (ZOFRAN-ODT) 4 MG disintegrating tablet TAKE 1 TABLET BY MOUTH EVERY 8 HOURS AS NEEDED FOR NAUSEA   pantoprazole (PROTONIX) 40 MG tablet Take 1 tablet (40 mg total) by mouth 2 (two) times daily.   Probiotic Product (ALIGN) 4 MG CAPS Take 1 capsule by mouth as needed.   QUEtiapine (SEROQUEL) 25 MG tablet Take 25 mg by mouth 3 (three) times daily.   RESTASIS 0.05 % ophthalmic emulsion 1 drop 2 (two) times daily.   vitamin C (ASCORBIC ACID) 500 MG tablet Take 500 mg by mouth daily.     Allergies:   Adhesive [tape], Albuterol, Cymbalta [duloxetine hcl], Latex, Lyrica [pregabalin], Neurontin [gabapentin], Nitrofuran derivatives, Paxil [paroxetine hcl], and Prozac [fluoxetine hcl]   Social History   Tobacco Use   Smoking status: Former    Packs/day: 0.50    Years: 15.00    Total pack years: 7.50    Types: Cigarettes    Quit date: 03/03/1998    Years since quitting: 23.7   Smokeless tobacco: Never   Tobacco comments:    smoked 1973- 2006, up to <  1  ppd  Vaping Use   Vaping Use: Never used  Substance Use Topics   Alcohol use: No    Alcohol/week: 0.0 standard drinks of alcohol   Drug use: No     Family Hx: The patient's family history includes Anemia in her mother; Coronary artery disease in her brother; Diabetes in her maternal  uncle; Heart attack in her father and paternal uncle; Hypertension in her mother; Hypertension (age of onset: 80) in her father; Neuropathy in her mother; Stroke (age of onset: 36) in her father. There is no history of Colon cancer, Seizures, Esophageal cancer, Rectal cancer, or Stomach cancer.  ROS:   Please see the history of present illness.    Review of Systems  Constitutional: Negative.   HENT: Negative.    Respiratory: Negative.    Cardiovascular: Negative.   Gastrointestinal: Negative.   Musculoskeletal:  Positive for joint pain.  Neurological: Negative.   Psychiatric/Behavioral:  Positive for depression.   All other systems reviewed  and are negative.   Labs/Other Tests and Data Reviewed:    EKG:  No ECG reviewed.  Recent Labs: 06/25/2021: Hemoglobin 13.2; Platelets 261.0 10/09/2021: ALT 14; BUN 22; Creatinine, Ser 0.58; Magnesium 1.9; Potassium 4.1; Sodium 136; TSH 2.88   Recent Lipid Panel Lab Results  Component Value Date/Time   CHOL 193 06/25/2021 09:14 AM   CHOL 199 08/09/2014 10:05 AM   TRIG 65.0 06/25/2021 09:14 AM   TRIG 50 08/09/2014 10:05 AM   HDL 69.90 06/25/2021 09:14 AM   HDL 75 08/09/2014 10:05 AM   CHOLHDL 3 06/25/2021 09:14 AM   LDLCALC 110 (H) 06/25/2021 09:14 AM   LDLCALC 114 (H) 08/09/2014 10:05 AM    Wt Readings from Last 3 Encounters:  11/12/21 130 lb (59 kg)  10/24/21 134 lb (60.8 kg)  10/09/21 132 lb (59.9 kg)     Objective:    Vital Signs:  BP 102/68 (BP Location: Left Arm, Patient Position: Sitting, Cuff Size: Normal)   Pulse 65   Ht '5\' 8"'$  (1.727 m)   Wt 130 lb (59 kg)   SpO2 98%   BMI 19.77 kg/m   Constitutional:  oriented to person, place, and time. No distress.  HENT:  Head: Grossly normal Eyes:  no discharge. No scleral icterus.  Neck: No JVD, no carotid bruits  Cardiovascular: Regular rate and rhythm, no murmurs appreciated Pulmonary/Chest: Clear to auscultation bilaterally, no wheezes or rails Abdominal: Soft.  no  distension.  no tenderness.  Musculoskeletal: Normal range of motion Neurological:  normal muscle tone. Coordination normal. No atrophy Skin: Skin warm and dry Psychiatric: normal affect, pleasant  ASSESSMENT & PLAN:    Depression/anxiety/adjustment disorder Managed by primary care, and therapist She is concerned about side effects of the Seroquel, Celexa   Chest pain, atypical cardiac CTA in 2022 with nonobstructive disease, minimal coronary calcification,  No further cardiac testing at this time  Mitral valve disorder Previously noted to have mitral valve regurgitation on echocardiogram 2020 Repeat echocardiogram ordered for further evaluation  Hip pain/hip fracture Has made full recovery   Hyperlipidemia, unspecified hyperlipidemia type -  Cardiac CTA with no significant disease   Palpitations -  Stable  Orthostatic hypotension Long hx, of relatively asymptomatic low blood pressure Not requiring midodrine or Florinef   Total encounter time more than 30 minutes  Greater than 50% was spent in counseling and coordination of care with the patient    Signed, Ida Rogue, MD  11/12/2021 11:58 AM    [place]

## 2021-11-12 ENCOUNTER — Ambulatory Visit: Payer: 59 | Attending: Cardiovascular Disease | Admitting: Cardiovascular Disease

## 2021-11-12 ENCOUNTER — Encounter: Payer: Self-pay | Admitting: Cardiovascular Disease

## 2021-11-12 VITALS — BP 102/68 | HR 65 | Ht 68.0 in | Wt 130.0 lb

## 2021-11-12 DIAGNOSIS — I951 Orthostatic hypotension: Secondary | ICD-10-CM

## 2021-11-12 DIAGNOSIS — F418 Other specified anxiety disorders: Secondary | ICD-10-CM

## 2021-11-12 DIAGNOSIS — E782 Mixed hyperlipidemia: Secondary | ICD-10-CM | POA: Diagnosis not present

## 2021-11-12 DIAGNOSIS — R079 Chest pain, unspecified: Secondary | ICD-10-CM

## 2021-11-12 DIAGNOSIS — I059 Rheumatic mitral valve disease, unspecified: Secondary | ICD-10-CM

## 2021-11-12 DIAGNOSIS — R002 Palpitations: Secondary | ICD-10-CM

## 2021-11-12 DIAGNOSIS — R0602 Shortness of breath: Secondary | ICD-10-CM

## 2021-11-12 NOTE — Patient Instructions (Addendum)
Medication Instructions:  No changes  If you need a refill on your cardiac medications before your next appointment, please call your pharmacy.   Lab work: No new labs needed  Testing/Procedures:  Echocardiogram  Your physician has requested that you have an echocardiogram. Echocardiography is a painless test that uses sound waves to create images of your heart. It provides your doctor with information about the size and shape of your heart and how well your heart's chambers and valves are working. This procedure takes approximately one hour. There are no restrictions for this procedure. Please note; depending on visual quality an IV may need to be placed.    Follow-Up: At [facility], you and your health needs are our priority.  As part of our continuing mission to provide you with exceptional heart care, we have created designated Provider Care Teams.  These Care Teams include your primary Cardiologist (physician) and Advanced Practice Providers (APPs -  Physician Assistants and Nurse Practitioners) who all work together to provide you with the care you need, when you need it.  You will need a follow up appointment in 12 months  Providers on your designated Care Team:   Christopher Berge, NP Ryan Dunn, PA-C Cadence Furth, PA-C  COVID-19 Vaccine Information can be found at: [email or web address] For questions related to vaccine distribution or appointments, please email [email or web address] or call [phone].   

## 2021-12-03 ENCOUNTER — Ambulatory Visit: Payer: 59 | Attending: Cardiovascular Disease

## 2021-12-03 DIAGNOSIS — I059 Rheumatic mitral valve disease, unspecified: Secondary | ICD-10-CM | POA: Diagnosis not present

## 2021-12-03 LAB — ECHOCARDIOGRAM COMPLETE
AR max vel: 2.77 cm2
AV Area VTI: 2.64 cm2
AV Area mean vel: 2.49 cm2
AV Mean grad: 5 mmHg
AV Peak grad: 7.7 mmHg
Ao pk vel: 1.39 m/s
Area-P 1/2: 3.21 cm2
Calc EF: 69.7 %
S' Lateral: 2.7 cm
Single Plane A2C EF: 68 %
Single Plane A4C EF: 70.8 %

## 2021-12-11 ENCOUNTER — Ambulatory Visit (INDEPENDENT_AMBULATORY_CARE_PROVIDER_SITE_OTHER): Payer: 59 | Admitting: Diagnostic Neuroimaging

## 2021-12-11 ENCOUNTER — Encounter: Payer: Self-pay | Admitting: Diagnostic Neuroimaging

## 2021-12-11 VITALS — Ht 68.0 in | Wt 130.0 lb

## 2021-12-11 DIAGNOSIS — G629 Polyneuropathy, unspecified: Secondary | ICD-10-CM | POA: Diagnosis not present

## 2021-12-11 NOTE — Progress Notes (Signed)
[facility]  PATIENT: [name] DOB: Nov 04, 1945  REFERRING CLINICIAN: Girtha Rm, NP-C HISTORY FROM: patient REASON FOR VISIT: new consult   HISTORICAL  CHIEF COMPLAINT:  Chief Complaint  Patient presents with   Follow-up    Pt reports having numbness in her feet and toes with pain. She reports its constant pain. She feels tingling and burning sensations. Room 6 alone    HISTORY OF PRESENT ILLNESS:   76 year old female here for evaluation of numbness and tingling.  Symptoms started around age 18 years old.  Started with numbness and tingling in the toes.  Gradually worsened over time.  Has seen several neurologist in the past including Dr. Jannifer Franklin.  Prior work-up has been negative.  She was diagnosed with idiopathic hereditary neuropathy.  She tried gabapentin, Lyrica and Cymbalta but could not tolerate these in the past.   REVIEW OF SYSTEMS: Full 14 system review of systems performed and negative with exception of: as per HPI.  ALLERGIES: Allergies  Allergen Reactions   Adhesive [Tape] Rash   Albuterol     heartburn   Cymbalta [Duloxetine Hcl]     Diarrhea, nausea, anxiety worse, shaky   Latex Other (See Comments)   Lyrica [Pregabalin] Other (See Comments)    Sedation   Neurontin [Gabapentin] Other (See Comments)    Sedation   Nitrofuran Derivatives Other (See Comments)    "tingling"   Paxil [Paroxetine Hcl]     Bowel upset, tingling   Prozac [Fluoxetine Hcl] Other (See Comments)    Made patient worse and constipation    HOME MEDICATIONS: Outpatient Medications Prior to Visit  Medication Sig Dispense Refill   citalopram (CELEXA) 40 MG tablet Take 40 mg by mouth at bedtime.     clonazePAM (KLONOPIN) 0.5 MG tablet Take 1 tablet (0.5 mg total) by mouth 2 (two) times daily. 60 tablet 2   Cyanocobalamin (VITAMIN B-12 PO) Take 1 tablet by mouth daily.     METAMUCIL FIBER PO Take by mouth in the morning and at bedtime.      ondansetron (ZOFRAN-ODT) 4 MG disintegrating tablet TAKE 1 TABLET BY MOUTH EVERY 8 HOURS AS NEEDED FOR NAUSEA 20 tablet 0   pantoprazole (PROTONIX) 40 MG tablet Take 1 tablet (40 mg total) by mouth 2 (two) times daily. 60 tablet 5   Probiotic Product (ALIGN) 4 MG CAPS Take 1 capsule by mouth as needed.     QUEtiapine (SEROQUEL) 25 MG tablet Take 25 mg by mouth 3 (three) times daily.     RESTASIS 0.05 % ophthalmic emulsion 1 drop 2 (two) times daily.     vitamin C (ASCORBIC ACID) 500 MG tablet Take 500 mg by mouth daily.     levalbuterol (XOPENEX HFA) 45 MCG/ACT inhaler Inhale 1-2 puffs into the lungs every 4 (four) hours as needed for wheezing. (Patient not taking: Reported on 12/11/2021) 1 each 12   No facility-administered medications prior to visit.    PAST MEDICAL HISTORY: Past Medical History:  Diagnosis Date   Allergy    Anemia    Anxiety    Asthma    Cataract    bil cateracts removed   Complication of anesthesia    Depression    generalized anxiety disorder   Diverticulosis    Duodenal diverticulum    Family history of adverse reaction to anesthesia    Mother and Daughters- N/V   Fibromyalgia    Gastroesophageal reflux disease with hiatal hernia    Hiatal hernia  History of kidney stones    Hyperlipidemia    IBS (irritable bowel syndrome)    Lymphocytic colitis    Dr Sharlett Iles   Migraine headache    Mitral valve prolapse    Neuropathy    Osteopenia    BMD ordered by GYN   Osteoporosis    Peripheral neuropathy    treated as RLS by  Neurology   PONV (postoperative nausea and vomiting)    Restless leg syndrome    Schatzki's ring    History of   Sleep apnea    On CPAP, has not been using   Spondylosis     PAST SURGICAL HISTORY: Past Surgical History:  Procedure Laterality Date   ANAL RECTAL MANOMETRY N/A 11/14/2015   Procedure: ANO RECTAL MANOMETRY;  Surgeon: Gatha Mayer, MD;  Location: [facility];  Service: Endoscopy;  Laterality: N/A;   BREAST  ENHANCEMENT SURGERY     CATARACT EXTRACTION Bilateral    cataract surgery Left    Dr Bing Plume   CHOLECYSTECTOMY     COLONOSCOPY     ESOPHAGEAL DILATION     X 2   ROTATOR CUFF REPAIR Left    SINUS ENDO W/FUSION N/A 05/30/2014   Procedure: REVISION  FRONTAL SINUS SURGERY WITH FUSION SCAN;  Surgeon: Jerrell Belfast, MD;  Location: [place];  Service: ENT;  Laterality: N/A;   SINUS SURGERY WITH INSTATRAK     x 2   TUBAL LIGATION     UPPER GASTROINTESTINAL ENDOSCOPY     Vaginal cystectomy     x 2   VAGINAL HYSTERECTOMY  05/2007   Vaginal repair, Dr Ubaldo Glassing.  Partial  hysterectomy.    FAMILY HISTORY: Family History  Problem Relation Age of Onset   Hypertension Father 64   Stroke Father 4   Heart attack Father         ? in 80s   Hypertension Mother    Neuropathy Mother    Anemia Mother    Coronary artery disease Brother        Stent placement in 86s   Diabetes Maternal Uncle    Heart attack Paternal Company secretary , ? age   Colon cancer Neg Hx    Seizures Neg Hx    Esophageal cancer Neg Hx    Rectal cancer Neg Hx    Stomach cancer Neg Hx     SOCIAL HISTORY: Social History   Socioeconomic History   Marital status: Married    Spouse name: Not on file   Number of children: 2   Years of education: 18   Highest education level: Not on file  Occupational History   Occupation: retired    Fish farm manager: [employer]  Tobacco Use   Smoking status: Former    Packs/day: 0.50    Years: 15.00    Total pack years: 7.50    Types: Cigarettes    Quit date: 03/03/1998    Years since quitting: 23.7   Smokeless tobacco: Never   Tobacco comments:    smoked 1973- 2006, up to <  1  ppd  Vaping Use   Vaping Use: Never used  Substance and Sexual Activity   Alcohol use: No    Alcohol/week: 0.0 standard drinks of alcohol   Drug use: No   Sexual activity: Yes    Partners: Male    Comment: 1st intercourse- 36, partners- 2, married- 54 yrs   Other Topics Concern   Not on file  Social History  Narrative   HAS REGULAR EXERCISE   DAILY CAFFEINE: 1-2 CUPS   Patient is right handed.   Social Determinants of Health   Financial Resource Strain: Not on file  Food Insecurity: Not on file  Transportation Needs: Not on file  Physical Activity: Not on file  Stress: Not on file  Social Connections: Not on file  Intimate Partner Violence: Not on file     PHYSICAL EXAM  GENERAL EXAM/CONSTITUTIONAL: Vitals:  Vitals:   12/11/21 1123  Weight: 130 lb (59 kg)  Height: _0  (1.727 m)   Body mass index is 19.77 kg/m. Wt Readings from Last 3 Encounters:  12/11/21 130 lb (59 kg)  11/12/21 130 lb (59 kg)  10/24/21 134 lb (60.8 kg)   Patient is in no distress; well developed, nourished and groomed; neck is supple  CARDIOVASCULAR: Examination of carotid arteries is normal; no carotid bruits Regular rate and rhythm, no murmurs Examination of peripheral vascular system by observation and palpation is normal  EYES: Ophthalmoscopic exam of optic discs and posterior segments is normal; no papilledema or hemorrhages No results found.  MUSCULOSKELETAL: Gait, strength, tone, movements noted in Neurologic exam below  NEUROLOGIC: MENTAL STATUS:      No data to display         awake, alert, oriented to person, place and time recent and remote memory intact normal attention and concentration language fluent, comprehension intact, naming intact fund of knowledge appropriate  CRANIAL NERVE:  2nd - no papilledema on fundoscopic exam 2nd, 3rd, 4th, 6th - pupils equal and reactive to light, visual fields full to confrontation, extraocular muscles intact, no nystagmus 5th - facial sensation symmetric 7th - facial strength symmetric 8th - hearing intact 9th - palate elevates symmetrically, uvula midline 11th - shoulder shrug symmetric 12th - tongue protrusion midline  MOTOR:  normal bulk and tone, full strength in the BUE, BLE  SENSORY:  normal and symmetric to light touch,  pinprick, temperature, vibration; EXCEPT SLIGHTLY DECR IN TOES  COORDINATION:  finger-nose-finger, fine finger movements normal  REFLEXES:  deep tendon reflexes TRACE and symmetric  GAIT/STATION:  narrow based gait     DIAGNOSTIC DATA (LABS, IMAGING, TESTING) - I reviewed patient records, labs, notes, testing and imaging myself where available.  Lab Results  Component Value Date   WBC 5.4 06/25/2021   HGB 13.2 06/25/2021   HCT 39.8 06/25/2021   MCV 89.2 06/25/2021   PLT 261.0 06/25/2021      Component Value Date/Time   NA 136 10/09/2021 1146   K 4.1 10/09/2021 1146   CL 101 10/09/2021 1146   CO2 30 10/09/2021 1146   GLUCOSE 114 (H) 10/09/2021 1146   BUN 22 10/09/2021 1146   CREATININE 0.58 10/09/2021 1146   CALCIUM 9.2 10/09/2021 1146   PROT 6.6 10/09/2021 1146   PROT 6.4 11/30/2012 1535   ALBUMIN 4.1 10/09/2021 1146   ALBUMIN 4.3 04/20/2012 1018   AST 21 10/09/2021 1146   ALT 14 10/09/2021 1146   ALKPHOS 59 10/09/2021 1146   BILITOT 0.3 10/09/2021 1146   GFRNONAA 89 (L) 05/30/2014 0637   GFRAA >90 05/30/2014 0637   Lab Results  Component Value Date   CHOL 193 06/25/2021   HDL 69.90 06/25/2021   LDLCALC 110 (H) 06/25/2021   TRIG 65.0 06/25/2021   CHOLHDL 3 06/25/2021   Lab Results  Component Value Date   HGBA1C 6.0 06/25/2021   Lab Results  Component Value Date   VITAMINB12 811  06/25/2021   Lab Results  Component Value Date   TSH 2.88 10/09/2021       ASSESSMENT AND PLAN  76 y.o. year old female here with:   Dx:  1. Neuropathy      PLAN:  FOOT PAIN / NUMBNESS --> NEUROPATHY (idiopathic / hereditary) - prior workup per Dr. Jannifer Franklin negative; will repeat some neuropathy labs - consider painful neuropathy treatment options vs pain mgmt clinic referral (apparently has tried and failed gabapentin, Lyrica and Cymbalta many years ago) -duloxetine 30-22m daily, amitriptyline 25-59mat bedtime, gabapentin 100-30089mhree times a day,  pregabalin 75-150m19mice a day -capsaicin cream, lidocaine patch / cream, alpha-lipoic acid 600mg32mly  Orders Placed This Encounter  Procedures   Multiple Myeloma Panel (SPEP&IFE w/QIG)   ANA,IFA RA Diag Pnl w/rflx Tit/Patn   Uric Acid   Angiotensin converting enzyme   ANCA Profile   Return for pending if symptoms worsen or fail to improve, pending test results.    VIKRAPenni [id number] in Neurology, Neurophysiology and Neuroimaging  [facility] [phone]

## 2021-12-11 NOTE — Patient Instructions (Signed)
  FOOT PAIN / NUMBNESS --> NEUROPATHY (idiopathic, hereditary) - prior workup per Dr. Jannifer Franklin negative - will check neuropathy labs - consider painful neuropathy treatment options vs pain mgmt clinic referral: -duloxetine 30-'60mg'$  daily, amitriptyline 25-'50mg'$  at bedtime, gabapentin 100-'300mg'$  three times a day, pregabalin 75-'150mg'$  twice a day -capsaicin cream, lidocaine patch / cream, alpha-lipoic acid '600mg'$  daily

## 2021-12-16 LAB — URIC ACID: Uric Acid: 2.9 mg/dL — ABNORMAL LOW (ref 3.1–7.9)

## 2021-12-16 LAB — MULTIPLE MYELOMA PANEL, SERUM
Albumin SerPl Elph-Mcnc: 3.9 g/dL (ref 2.9–4.4)
Albumin/Glob SerPl: 1.6 (ref 0.7–1.7)
Alpha 1: 0.2 g/dL (ref 0.0–0.4)
Alpha2 Glob SerPl Elph-Mcnc: 0.7 g/dL (ref 0.4–1.0)
B-Globulin SerPl Elph-Mcnc: 0.9 g/dL (ref 0.7–1.3)
Gamma Glob SerPl Elph-Mcnc: 0.8 g/dL (ref 0.4–1.8)
Globulin, Total: 2.6 g/dL (ref 2.2–3.9)
IgA/Immunoglobulin A, Serum: 92 mg/dL (ref 64–422)
IgG (Immunoglobin G), Serum: 848 mg/dL (ref 586–1602)
IgM (Immunoglobulin M), Srm: 129 mg/dL (ref 26–217)
Total Protein: 6.5 g/dL (ref 6.0–8.5)

## 2021-12-16 LAB — ANA,IFA RA DIAG PNL W/RFLX TIT/PATN
ANA Titer 1: NEGATIVE
Cyclic Citrullin Peptide Ab: 2 units (ref 0–19)
Rheumatoid fact SerPl-aCnc: <10 IU/mL (ref <14.0)

## 2021-12-16 LAB — ANCA PROFILE
Anti-MPO Antibodies: <0.2 units (ref 0.0–0.9)
Anti-PR3 Antibodies: <0.2 units (ref 0.0–0.9)
Atypical pANCA: <1:20 {titer}
C-ANCA: <1:20 {titer}
P-ANCA: <1:20 {titer}

## 2021-12-16 LAB — ANGIOTENSIN CONVERTING ENZYME: Angio Convert Enzyme: 61 U/L (ref 14–82)

## 2021-12-17 ENCOUNTER — Telehealth: Payer: Self-pay

## 2021-12-17 NOTE — Telephone Encounter (Signed)
-----   Message from Penni Bombard, MD sent at 12/16/2021  7:16 PM EDT ----- Unremarkable labs. Continue current plan. Please call patient. -VRP

## 2021-12-17 NOTE — Telephone Encounter (Signed)
Contacted pt, informed her labs are unremarkable, no concerns. Advised to call office back with questions as she had none at this time and was appreciative.

## 2022-01-07 ENCOUNTER — Ambulatory Visit: Payer: Self-pay | Admitting: Internal Medicine

## 2022-01-09 ENCOUNTER — Telehealth: Payer: Self-pay | Admitting: Internal Medicine

## 2022-01-09 DIAGNOSIS — E782 Mixed hyperlipidemia: Secondary | ICD-10-CM

## 2022-01-09 DIAGNOSIS — R002 Palpitations: Secondary | ICD-10-CM

## 2022-01-09 DIAGNOSIS — F3289 Other specified depressive episodes: Secondary | ICD-10-CM

## 2022-01-09 DIAGNOSIS — R7303 Prediabetes: Secondary | ICD-10-CM

## 2022-01-09 DIAGNOSIS — E559 Vitamin D deficiency, unspecified: Secondary | ICD-10-CM

## 2022-01-09 DIAGNOSIS — E538 Deficiency of other specified B group vitamins: Secondary | ICD-10-CM

## 2022-01-09 DIAGNOSIS — M810 Age-related osteoporosis without current pathological fracture: Secondary | ICD-10-CM

## 2022-01-09 DIAGNOSIS — F5104 Psychophysiologic insomnia: Secondary | ICD-10-CM

## 2022-01-09 NOTE — Telephone Encounter (Signed)
ordered

## 2022-01-09 NOTE — Telephone Encounter (Signed)
Pt rescheduled her physical to 02/03/22. Pt asking if provider has lab orders in, can she stop by the lab anytime to do the labwork for the physical.  Please put in the lab orders and confirm. Pt ph [phone]

## 2022-01-10 NOTE — Telephone Encounter (Signed)
Spoke with patient today. 

## 2022-01-13 ENCOUNTER — Telehealth: Payer: Self-pay | Admitting: Internal Medicine

## 2022-01-13 ENCOUNTER — Encounter: Payer: Self-pay | Admitting: Internal Medicine

## 2022-01-13 DIAGNOSIS — Z0000 Encounter for general adult medical examination without abnormal findings: Secondary | ICD-10-CM

## 2022-01-13 NOTE — Telephone Encounter (Signed)
I am not sure if it will be covered by her insurance but I have ordered it.

## 2022-01-13 NOTE — Telephone Encounter (Signed)
Patient would like this test added to her blood work - Hash Moto's thyroiditis .

## 2022-01-14 NOTE — Telephone Encounter (Signed)
Spoke with patient today. 

## 2022-01-21 ENCOUNTER — Other Ambulatory Visit (INDEPENDENT_AMBULATORY_CARE_PROVIDER_SITE_OTHER): Payer: 59

## 2022-01-21 DIAGNOSIS — E782 Mixed hyperlipidemia: Secondary | ICD-10-CM | POA: Diagnosis not present

## 2022-01-21 DIAGNOSIS — Z0000 Encounter for general adult medical examination without abnormal findings: Secondary | ICD-10-CM

## 2022-01-21 DIAGNOSIS — M810 Age-related osteoporosis without current pathological fracture: Secondary | ICD-10-CM

## 2022-01-21 DIAGNOSIS — E538 Deficiency of other specified B group vitamins: Secondary | ICD-10-CM | POA: Diagnosis not present

## 2022-01-21 DIAGNOSIS — F3289 Other specified depressive episodes: Secondary | ICD-10-CM

## 2022-01-21 DIAGNOSIS — R002 Palpitations: Secondary | ICD-10-CM | POA: Diagnosis not present

## 2022-01-21 DIAGNOSIS — R7303 Prediabetes: Secondary | ICD-10-CM

## 2022-01-21 DIAGNOSIS — F5104 Psychophysiologic insomnia: Secondary | ICD-10-CM

## 2022-01-21 DIAGNOSIS — E559 Vitamin D deficiency, unspecified: Secondary | ICD-10-CM

## 2022-01-21 LAB — COMPREHENSIVE METABOLIC PANEL
ALT: 14 U/L (ref 0–35)
AST: 23 U/L (ref 0–37)
Albumin: 4.2 g/dL (ref 3.5–5.2)
Alkaline Phosphatase: 57 U/L (ref 39–117)
BUN: 17 mg/dL (ref 6–23)
CO2: 29 mEq/L (ref 19–32)
Calcium: 9.2 mg/dL (ref 8.4–10.5)
Chloride: 105 mEq/L (ref 96–112)
Creatinine, Ser: 0.72 mg/dL (ref 0.40–1.20)
GFR: 80.36 mL/min (ref >60.00)
Glucose, Bld: 84 mg/dL (ref 70–99)
Potassium: 4 mEq/L (ref 3.5–5.1)
Sodium: 139 mEq/L (ref 135–145)
Total Bilirubin: 0.5 mg/dL (ref 0.2–1.2)
Total Protein: 6.5 g/dL (ref 6.0–8.3)

## 2022-01-21 LAB — TSH: TSH: 3.76 u[IU]/mL (ref 0.35–5.50)

## 2022-01-21 LAB — VITAMIN B12: Vitamin B-12: 1244 pg/mL — ABNORMAL HIGH (ref 211–911)

## 2022-01-21 LAB — CBC WITH DIFFERENTIAL/PLATELET
Basophils Absolute: 0.1 10*3/uL (ref 0.0–0.1)
Basophils Relative: 1.3 % (ref 0.0–3.0)
Eosinophils Absolute: 0.1 10*3/uL (ref 0.0–0.7)
Eosinophils Relative: 2.1 % (ref 0.0–5.0)
HCT: 37.3 % (ref 36.0–46.0)
Hemoglobin: 12.7 g/dL (ref 12.0–15.0)
Lymphocytes Relative: 28.2 % (ref 12.0–46.0)
Lymphs Abs: 1.3 10*3/uL (ref 0.7–4.0)
MCHC: 34 g/dL (ref 30.0–36.0)
MCV: 90.7 fl (ref 78.0–100.0)
Monocytes Absolute: 0.4 10*3/uL (ref 0.1–1.0)
Monocytes Relative: 8.3 % (ref 3.0–12.0)
Neutro Abs: 2.8 10*3/uL (ref 1.4–7.7)
Neutrophils Relative %: 60.1 % (ref 43.0–77.0)
Platelets: 252 10*3/uL (ref 150.0–400.0)
RBC: 4.11 Mil/uL (ref 3.87–5.11)
RDW: 14.5 % (ref 11.5–15.5)
WBC: 4.6 10*3/uL (ref 4.0–10.5)

## 2022-01-21 LAB — LIPID PANEL
Cholesterol: 176 mg/dL (ref 0–200)
HDL: 63.9 mg/dL (ref >39.00)
LDL Cholesterol: 101 mg/dL — ABNORMAL HIGH (ref 0–99)
NonHDL: 111.77
Total CHOL/HDL Ratio: 3
Triglycerides: 53 mg/dL (ref 0.0–149.0)
VLDL: 10.6 mg/dL (ref 0.0–40.0)

## 2022-01-21 LAB — VITAMIN D 25 HYDROXY (VIT D DEFICIENCY, FRACTURES): VITD: 64.14 ng/mL (ref 30.00–100.00)

## 2022-01-21 LAB — HEMOGLOBIN A1C: Hgb A1c MFr Bld: 5.9 % (ref 4.6–6.5)

## 2022-01-22 LAB — THYROID ANTIBODIES
Thyroglobulin Ab: <1 IU/mL (ref <=1)
Thyroperoxidase Ab SerPl-aCnc: 1 IU/mL (ref <9)

## 2022-02-02 ENCOUNTER — Encounter: Payer: Self-pay | Admitting: Internal Medicine

## 2022-02-02 NOTE — Patient Instructions (Addendum)
Flu immunization administered today.      Medications changes include :   none    Return in about 1 year (around 02/04/2023) for Physical Exam.    Health Maintenance, Female Adopting a healthy lifestyle and getting preventive care are important in promoting health and wellness. Ask your health care provider about: The right schedule for you to have regular tests and exams. Things you can do on your own to prevent diseases and keep yourself healthy. What should I know about diet, weight, and exercise? Eat a healthy diet  Eat a diet that includes plenty of vegetables, fruits, low-fat dairy products, and lean protein. Do not eat a lot of foods that are high in solid fats, added sugars, or sodium. Maintain a healthy weight Body mass index (BMI) is used to identify weight problems. It estimates body fat based on height and weight. Your health care provider can help determine your BMI and help you achieve or maintain a healthy weight. Get regular exercise Get regular exercise. This is one of the most important things you can do for your health. Most adults should: Exercise for at least 150 minutes each week. The exercise should increase your heart rate and make you sweat (moderate-intensity exercise). Do strengthening exercises at least twice a week. This is in addition to the moderate-intensity exercise. Spend less time sitting. Even light physical activity can be beneficial. Watch cholesterol and blood lipids Have your blood tested for lipids and cholesterol at 78 years of age, then have this test every 5 years. Have your cholesterol levels checked more often if: Your lipid or cholesterol levels are high. You are older than 78 years of age. You are at high risk for heart disease. What should I know about cancer screening? Depending on your health history and family history, you may need to have cancer screening at various ages. This may include screening for: Breast  cancer. Cervical cancer. Colorectal cancer. Skin cancer. Lung cancer. What should I know about heart disease, diabetes, and high blood pressure? Blood pressure and heart disease High blood pressure causes heart disease and increases the risk of stroke. This is more likely to develop in people who have high blood pressure readings or are overweight. Have your blood pressure checked: Every 3-5 years if you are 11-11 years of age. Every year if you are 89 years old or older. Diabetes Have regular diabetes screenings. This checks your fasting blood sugar level. Have the screening done: Once every three years after age 42 if you are at a normal weight and have a low risk for diabetes. More often and at a younger age if you are overweight or have a high risk for diabetes. What should I know about preventing infection? Hepatitis B If you have a higher risk for hepatitis B, you should be screened for this virus. Talk with your health care provider to find out if you are at risk for hepatitis B infection. Hepatitis C Testing is recommended for: Everyone born from [birth year] through 1965. Anyone with known risk factors for hepatitis C. Sexually transmitted infections (STIs) Get screened for STIs, including gonorrhea and chlamydia, if: You are sexually active and are younger than 78 years of age. You are older than 78 years of age and your health care provider tells you that you are at risk for this type of infection. Your sexual activity has changed since you were last screened, and you are at increased risk for chlamydia or gonorrhea. Ask your  health care provider if you are at risk. Ask your health care provider about whether you are at high risk for HIV. Your health care provider may recommend a prescription medicine to help prevent HIV infection. If you choose to take medicine to prevent HIV, you should first get tested for HIV. You should then be tested every 3 months for as long as you are taking  the medicine. Pregnancy If you are about to stop having your period (premenopausal) and you may become pregnant, seek counseling before you get pregnant. Take 400 to 800 micrograms (mcg) of folic acid every day if you become pregnant. Ask for birth control (contraception) if you want to prevent pregnancy. Osteoporosis and menopause Osteoporosis is a disease in which the bones lose minerals and strength with aging. This can result in bone fractures. If you are 58 years old or older, or if you are at risk for osteoporosis and fractures, ask your health care provider if you should: Be screened for bone loss. Take a calcium or vitamin D supplement to lower your risk of fractures. Be given hormone replacement therapy (HRT) to treat symptoms of menopause. Follow these instructions at home: Alcohol use Do not drink alcohol if: Your health care provider tells you not to drink. You are pregnant, may be pregnant, or are planning to become pregnant. If you drink alcohol: Limit how much you have to: 0-1 drink a day. Know how much alcohol is in your drink. In the [place] one drink equals one 12 oz bottle of beer (355 mL), one 5 oz glass of wine (148 mL), or one 1 oz glass of hard liquor (44 mL). Lifestyle Do not use any products that contain nicotine or tobacco. These products include cigarettes, chewing tobacco, and vaping devices, such as e-cigarettes. If you need help quitting, ask your health care provider. Do not use street drugs. Do not share needles. Ask your health care provider for help if you need support or information about quitting drugs. General instructions Schedule regular health, dental, and eye exams. Stay current with your vaccines. Tell your health care provider if: You often feel depressed. You have ever been abused or do not feel safe at home. Summary Adopting a healthy lifestyle and getting preventive care are important in promoting health and wellness. Follow your health  care provider's instructions about healthy diet, exercising, and getting tested or screened for diseases. Follow your health care provider's instructions on monitoring your cholesterol and blood pressure. This information is not intended to replace advice given to you by your health care provider. Make sure you discuss any questions you have with your health care provider. Document Revised: 07/09/2020 Document Reviewed: 07/09/2020 Elsevier Patient Education  [place].

## 2022-02-02 NOTE — Progress Notes (Unsigned)
Subjective:    Patient ID: [name], female    DOB: Apr 11, 1945, 76 y.o.   MRN: [id number]      HPI [name] is here for a Physical exam.    Weight has been going up.  ? Related to medication or thyroid - requested thyroid labs.  Still working with psychiatry and her therapist to deal with some of the depression and grieving.  Medications and allergies reviewed with patient and updated if appropriate.  Current Outpatient Medications on File Prior to Visit  Medication Sig Dispense Refill   citalopram (CELEXA) 10 MG tablet Take 10 mg by mouth daily.     cloNIDine (CATAPRES) 0.2 MG tablet Take 0.2 mg by mouth at bedtime.     Cyanocobalamin (VITAMIN B-12 PO) Take 1 tablet by mouth daily.     levalbuterol (XOPENEX HFA) 45 MCG/ACT inhaler Inhale 1-2 puffs into the lungs every 4 (four) hours as needed for wheezing. 1 each 12   METAMUCIL FIBER PO Take by mouth in the morning and at bedtime.     ondansetron (ZOFRAN-ODT) 4 MG disintegrating tablet TAKE 1 TABLET BY MOUTH EVERY 8 HOURS AS NEEDED FOR NAUSEA 20 tablet 0   Probiotic Product (ALIGN) 4 MG CAPS Take 1 capsule by mouth as needed.     QUEtiapine (SEROQUEL) 25 MG tablet Take 25 mg by mouth 3 (three) times daily.     RESTASIS 0.05 % ophthalmic emulsion 1 drop 2 (two) times daily.     vitamin C (ASCORBIC ACID) 500 MG tablet Take 500 mg by mouth daily.     [DISCONTINUED] colesevelam (WELCHOL) 625 MG tablet Take 625 mg by mouth 2 (two) times daily with a meal. Take one tablet by mouth twice a day     [DISCONTINUED] Hyoscyamine-Phenyltoloxamine (DIGEX NF) 3.4196-22 MG CAPS Take one tablet by mouth as needed for abdominal cramping 24 each 0   No current facility-administered medications on file prior to visit.    Review of Systems  Constitutional:  Negative for fever.  Eyes:  Negative for visual disturbance.  Respiratory:  Positive for cough (sometimes) and shortness of breath (sometimes). Negative for wheezing.    Cardiovascular:  Positive for chest pain (noncardiac) and palpitations. Negative for leg swelling.  Gastrointestinal:  Positive for constipation (metamucil) and nausea (with anxiety - occ). Negative for abdominal pain, blood in stool and diarrhea.  Genitourinary:  Negative for dysuria.  Musculoskeletal:  Positive for arthralgias (left hip, left wrist,), back pain, neck pain and neck stiffness.  Skin:  Negative for rash.  Neurological:  Positive for dizziness (sometimes) and headaches (posterior head from neck issues). Negative for light-headedness.  Psychiatric/Behavioral:  Positive for dysphoric mood and sleep disturbance. The patient is nervous/anxious.        Objective:   Vitals:   02/03/22 1426  BP: 118/72  Pulse: 97  Temp: 97.9 F (36.6 C)  SpO2: 98%   Filed Weights   02/03/22 1426  Weight: 144 lb (65.3 kg)   Body mass index is 21.9 kg/m.  BP Readings from Last 3 Encounters:  02/03/22 118/72  11/12/21 102/68  10/24/21 90/60    Wt Readings from Last 3 Encounters:  02/03/22 144 lb (65.3 kg)  12/11/21 130 lb (59 kg)  11/12/21 130 lb (59 kg)       Physical Exam Constitutional: She appears well-developed and well-nourished. No distress.  HENT:  Head: Normocephalic and atraumatic.  Right Ear: External ear normal. Normal ear canal and TM Left Ear: External  ear normal.  Normal ear canal and TM Mouth/Throat: Oropharynx is clear and moist.  Eyes: Conjunctivae normal.  Neck: Neck supple. No tracheal deviation present. No thyromegaly present.  No carotid bruit  Cardiovascular: Normal rate, regular rhythm and normal heart sounds.   No murmur heard.  No edema. Pulmonary/Chest: Effort normal and breath sounds normal. No respiratory distress. She has no wheezes. She has no rales.  Breast: deferred   Abdominal: Soft. She exhibits no distension. There is no tenderness.  Lymphadenopathy: She has no cervical adenopathy.  Skin: Skin is warm and dry. She is not diaphoretic.   Psychiatric: She has a normal mood and affect. Her behavior is normal.     Lab Results  Component Value Date   WBC 4.6 01/21/2022   HGB 12.7 01/21/2022   HCT 37.3 01/21/2022   PLT 252.0 01/21/2022   GLUCOSE 84 01/21/2022   CHOL 176 01/21/2022   TRIG 53.0 01/21/2022   HDL 63.90 01/21/2022   LDLCALC 101 (H) 01/21/2022   ALT 14 01/21/2022   AST 23 01/21/2022   NA 139 01/21/2022   K 4.0 01/21/2022   CL 105 01/21/2022   CREATININE 0.72 01/21/2022   BUN 17 01/21/2022   CO2 29 01/21/2022   TSH 3.76 01/21/2022   INR 1.08 01/09/2009   HGBA1C 5.9 01/21/2022   MICROALBUR 2.5 (H) 01/06/2011         Assessment & Plan:   Physical exam: Screening blood work reviewed Exercise  no regular exercise-stopped secondary to broken foot-advised to restart when able Weight  normal Substance abuse  none   Reviewed recommended immunizations.  Flu immunization administered today.     Health Maintenance  Topic Date Due   INFLUENZA VACCINE  10/01/2021   COVID-19 Vaccine (3 - 2023-24 season) 02/19/2022 (Originally 11/01/2021)   DEXA SCAN  10/09/2023   DTaP/Tdap/Td (2 - Td or Tdap) 12/21/2028   Pneumonia Vaccine 66+ Years old  Completed   Hepatitis C Screening  Completed   Zoster Vaccines- Shingrix  Completed   HPV VACCINES  Aged Out   COLONOSCOPY (Pts 45-53yr Insurance coverage will need to be confirmed)  Discontinued          See Problem List for Assessment and Plan of chronic medical problems.

## 2022-02-03 ENCOUNTER — Ambulatory Visit (INDEPENDENT_AMBULATORY_CARE_PROVIDER_SITE_OTHER): Payer: 59 | Admitting: Internal Medicine

## 2022-02-03 VITALS — BP 118/72 | HR 97 | Temp 97.9°F | Ht 68.0 in | Wt 144.0 lb

## 2022-02-03 DIAGNOSIS — R7303 Prediabetes: Secondary | ICD-10-CM

## 2022-02-03 DIAGNOSIS — Z0000 Encounter for general adult medical examination without abnormal findings: Secondary | ICD-10-CM | POA: Diagnosis not present

## 2022-02-03 DIAGNOSIS — M810 Age-related osteoporosis without current pathological fracture: Secondary | ICD-10-CM

## 2022-02-03 DIAGNOSIS — E538 Deficiency of other specified B group vitamins: Secondary | ICD-10-CM

## 2022-02-03 DIAGNOSIS — F3289 Other specified depressive episodes: Secondary | ICD-10-CM

## 2022-02-03 DIAGNOSIS — K219 Gastro-esophageal reflux disease without esophagitis: Secondary | ICD-10-CM | POA: Diagnosis not present

## 2022-02-03 DIAGNOSIS — Z23 Encounter for immunization: Secondary | ICD-10-CM

## 2022-02-03 DIAGNOSIS — E782 Mixed hyperlipidemia: Secondary | ICD-10-CM

## 2022-02-03 DIAGNOSIS — F411 Generalized anxiety disorder: Secondary | ICD-10-CM

## 2022-02-03 DIAGNOSIS — E559 Vitamin D deficiency, unspecified: Secondary | ICD-10-CM

## 2022-02-03 MED ORDER — ONDANSETRON 4 MG PO TBDP: 4.0000 mg | ORAL_TABLET | Freq: Three times a day (TID) | ORAL | 0 refills | Status: DC | PRN

## 2022-02-03 MED ORDER — PANTOPRAZOLE SODIUM 40 MG PO TBEC: 40.0000 mg | DELAYED_RELEASE_TABLET | Freq: Every day | ORAL | 5 refills | Status: DC | PRN

## 2022-02-03 MED ORDER — CLONAZEPAM 0.5 MG PO TABS: 0.2500 mg | ORAL_TABLET | Freq: Two times a day (BID) | ORAL | 2 refills | Status: DC

## 2022-02-03 NOTE — Assessment & Plan Note (Signed)
Chronic Taking vitamin B12-level is elevated-discussed she can decrease how much she is taking now

## 2022-02-03 NOTE — Assessment & Plan Note (Signed)
Chronic Management per psychiatry Medications being adjusted secondary to side effects and to help better control her anxiety and depression

## 2022-02-03 NOTE — Assessment & Plan Note (Signed)
Chronic Reviewed DEXA Elevated FRAX, severe osteopenia-encouraged her to consider treatment She is having some jaw issues that sound more like TMJ and not jaw necrosis-she sees her dentist tomorrow and we will confirm with him She is interested in seeing Dr. Cruzita Lederer again to discuss treatment options-referral ordered Stressed regular exercise, calcium and vitamin D

## 2022-02-03 NOTE — Assessment & Plan Note (Signed)
Chronic GERD controlled Continue pantoprazole 40 mg daily prn

## 2022-02-03 NOTE — Assessment & Plan Note (Signed)
Chronic Diet controlled Reviewed cholesterol-LDL adequately controlled with lifestyle Encouraged regular exercise, healthy diet

## 2022-02-03 NOTE — Assessment & Plan Note (Signed)
Chronic A1c stable in the prediabetic range-slightly better than this past year Stressed regular exercise, diabetic diet

## 2022-02-03 NOTE — Assessment & Plan Note (Signed)
Chronic Vitamin D level is good Continue current vitamin D supplementation

## 2022-02-03 NOTE — Assessment & Plan Note (Signed)
Chronic Following with psychiatry-they are adjusting her medications to keep her anxiety is well-controlled as possible

## 2022-02-05 ENCOUNTER — Other Ambulatory Visit: Payer: Self-pay | Admitting: Internal Medicine

## 2022-03-09 ENCOUNTER — Encounter: Payer: Self-pay | Admitting: Internal Medicine

## 2022-03-09 NOTE — Progress Notes (Unsigned)
    Subjective:    Patient ID: [name], female    DOB: 1945/12/04, 77 y.o.   MRN: [id number]      HPI [name] is here for No chief complaint on file.   Back pain -    SOB -      Saw cardio 11/2021.  Echo 10/23 - nml EF, nml RV, mild-mod MR.   CTA 10/2020 - minimal coronary art calcifications, 2016 CAC 0  2018 - saw pulm has asthma, OSA on cpap.     Medications and allergies reviewed with patient and updated if appropriate.  Current Outpatient Medications on File Prior to Visit  Medication Sig Dispense Refill   citalopram (CELEXA) 10 MG tablet Take 10 mg by mouth daily.     clonazePAM (KLONOPIN) 0.5 MG tablet Take 0.5 tablets (0.25 mg total) by mouth 2 (two) times daily. 60 tablet 2   cloNIDine (CATAPRES) 0.2 MG tablet Take 0.2 mg by mouth at bedtime.     Cyanocobalamin (VITAMIN B-12 PO) Take 1 tablet by mouth daily.     levalbuterol (XOPENEX HFA) 45 MCG/ACT inhaler Inhale 1-2 puffs into the lungs every 4 (four) hours as needed for wheezing. 1 each 12   METAMUCIL FIBER PO Take by mouth in the morning and at bedtime.     ondansetron (ZOFRAN-ODT) 4 MG disintegrating tablet Take 1 tablet (4 mg total) by mouth every 8 (eight) hours as needed. for nausea 20 tablet 0   pantoprazole (PROTONIX) 40 MG tablet Take 1 tablet (40 mg total) by mouth daily as needed. 90 tablet 5   Probiotic Product (ALIGN) 4 MG CAPS Take 1 capsule by mouth as needed.     QUEtiapine (SEROQUEL) 25 MG tablet Take 25 mg by mouth 3 (three) times daily.     RESTASIS 0.05 % ophthalmic emulsion 1 drop 2 (two) times daily.     vitamin C (ASCORBIC ACID) 500 MG tablet Take 500 mg by mouth daily.     [DISCONTINUED] colesevelam (WELCHOL) 625 MG tablet Take 625 mg by mouth 2 (two) times daily with a meal. Take one tablet by mouth twice a day     [DISCONTINUED] Hyoscyamine-Phenyltoloxamine (DIGEX NF) 3.0160-10 MG CAPS Take one tablet by mouth as needed for abdominal cramping 24 each 0   No current  facility-administered medications on file prior to visit.    Review of Systems     Objective:  There were no vitals filed for this visit. BP Readings from Last 3 Encounters:  02/03/22 118/72  11/12/21 102/68  10/24/21 90/60   Wt Readings from Last 3 Encounters:  02/03/22 144 lb (65.3 kg)  12/11/21 130 lb (59 kg)  11/12/21 130 lb (59 kg)   There is no height or weight on file to calculate BMI.    Physical Exam         Assessment & Plan:    See Problem List for Assessment and Plan of chronic medical problems.

## 2022-03-09 NOTE — Patient Instructions (Signed)
     You were tested for COVID, flu and RSV today. They are all negative.     Have a chest xray today downstairs.     Medications changes include :   increase the pantoprazole to daily and continue daily.  Doxycyline twice daily x 10 days, hycodan cough syrup.      Return if symptoms worsen or fail to improve.

## 2022-03-10 ENCOUNTER — Ambulatory Visit: Payer: 59 | Admitting: Internal Medicine

## 2022-03-10 ENCOUNTER — Ambulatory Visit (INDEPENDENT_AMBULATORY_CARE_PROVIDER_SITE_OTHER): Payer: 59

## 2022-03-10 VITALS — BP 124/80 | HR 64 | Temp 98.0°F | Ht 68.0 in | Wt 143.0 lb

## 2022-03-10 DIAGNOSIS — J069 Acute upper respiratory infection, unspecified: Secondary | ICD-10-CM

## 2022-03-10 DIAGNOSIS — K219 Gastro-esophageal reflux disease without esophagitis: Secondary | ICD-10-CM | POA: Diagnosis not present

## 2022-03-10 LAB — POC COVID19 BINAXNOW: SARS Coronavirus 2 Ag: NEGATIVE

## 2022-03-10 LAB — POC INFLUENZA A&B (BINAX/QUICKVUE)
Influenza A, POC: NEGATIVE
Influenza B, POC: NEGATIVE

## 2022-03-10 LAB — POCT RESPIRATORY SYNCYTIAL VIRUS: RSV Rapid Ag: NEGATIVE

## 2022-03-10 MED ORDER — HYDROCODONE BIT-HOMATROP MBR 5-1.5 MG/5ML PO SOLN: 5.0000 mL | Freq: Three times a day (TID) | ORAL | 0 refills | Status: DC | PRN

## 2022-03-10 MED ORDER — DOXYCYCLINE HYCLATE 100 MG PO TABS: 100.0000 mg | ORAL_TABLET | Freq: Two times a day (BID) | ORAL | 0 refills | Status: AC

## 2022-03-10 NOTE — Addendum Note (Signed)
Addended by: Marcina Millard on: 03/10/2022 04:26 PM   Modules accepted: Orders

## 2022-03-10 NOTE — Assessment & Plan Note (Signed)
Acute COVID, RSV, flu test all negative She does have asthma and probably COPD-no active wheeze on exam Some of her chest tightness and back pain may be related to her GERD Check chest x-ray-no infection noted Concern for possible bacterial cause will start doxycycline 100 mg twice daily x 10 days and Hycodan cough syrup Rest, fluids Over-the-counter cold medications as needed She will call if she is not improving or with any concerns

## 2022-03-10 NOTE — Assessment & Plan Note (Signed)
Chronic GERD not controlled - taking pantoprazole every other day Increase pantoprazole to 40 mg daily

## 2022-03-13 ENCOUNTER — Telehealth: Payer: Self-pay | Admitting: Internal Medicine

## 2022-03-13 MED ORDER — HYDROCOD POLI-CHLORPHE POLI ER 10-8 MG/5ML PO SUER: 5.0000 mL | Freq: Two times a day (BID) | ORAL | 0 refills | Status: DC | PRN

## 2022-03-13 MED ORDER — HYDROCODONE BIT-HOMATROP MBR 5-1.5 MG/5ML PO SOLN: 5.0000 mL | Freq: Three times a day (TID) | ORAL | 0 refills | Status: DC | PRN

## 2022-03-13 NOTE — Telephone Encounter (Signed)
Patient called back stating that her pharmacy on file doesn't have the substituted medication chlorpheniramine-HYDROcodone (TUSSIONEX) 10-8 MG/5ML. Patient did state that she called around and found out that [facility] [address] [facility] first medication Dr. Quay Burow sent in HYDROcodone bit-homatropine (HYCODAN) 5-1.5 MG/5ML syrup. Patient is requesting that this medication be [facility] to [facility] [address]. Best callback number is [phone].

## 2022-03-13 NOTE — Telephone Encounter (Signed)
Patient called stating that her pharmacy on file does not have the medication HYDROcodone bit-homatropine (HYCODAN) 5-1.5 MG/5ML syrup. She stated that they do not carry it. Patient is requesting that a different cough medication be called into her pharmacy. [id number] [place], [place] - [id number] . Best callback number is [phone]

## 2022-03-13 NOTE — Telephone Encounter (Signed)
Patient called back and stated that its the [address] [facility] [address] that has the medication.

## 2022-03-13 NOTE — Telephone Encounter (Signed)
Substitute sent

## 2022-03-13 NOTE — Telephone Encounter (Signed)
[facility] [address]

## 2022-03-14 ENCOUNTER — Telehealth: Payer: Self-pay | Admitting: Internal Medicine

## 2022-03-14 MED ORDER — PROMETHAZINE-DM 6.25-15 MG/5ML PO SYRP: 5.0000 mL | ORAL_SOLUTION | Freq: Four times a day (QID) | ORAL | 0 refills | Status: DC | PRN

## 2022-03-14 MED ORDER — HYDROCOD POLI-CHLORPHE POLI ER 10-8 MG/5ML PO SUER: 5.0000 mL | Freq: Two times a day (BID) | ORAL | 0 refills | Status: DC | PRN

## 2022-03-14 NOTE — Telephone Encounter (Signed)
Patient states that she can not get her cough medicine - the pharmacy said they could not fill it for her - Please call patient back.  Patient's number:  [phone]

## 2022-03-14 NOTE — Telephone Encounter (Signed)
Called Pt and let her know prescription has been sent in.

## 2022-03-14 NOTE — Telephone Encounter (Signed)
PT calls in today regarding the ongoing RX request. PT stated the would like a call back from Dr.Burns or Dominica regarding specifications for the prescription and pharmacy.  CB: [phone]

## 2022-03-14 NOTE — Addendum Note (Signed)
Addended by: [name] on: 03/14/2022 03:12 PM   Modules accepted: Orders

## 2022-03-14 NOTE — Telephone Encounter (Signed)
I sent promethazine- dm in for her - [facility] should have it

## 2022-04-17 ENCOUNTER — Other Ambulatory Visit: Payer: Self-pay | Admitting: Internal Medicine

## 2022-04-17 ENCOUNTER — Other Ambulatory Visit: Payer: Self-pay

## 2022-04-22 ENCOUNTER — Encounter: Payer: Self-pay | Admitting: Internal Medicine

## 2022-04-22 NOTE — Progress Notes (Unsigned)
    Subjective:    Patient ID: [name], female    DOB: [birth year], 77 y.o.   MRN: [id number]      HPI [name] is here for No chief complaint on file.   Dry cough  Pain in chest     Medications and allergies reviewed with patient and updated if appropriate.  Current Outpatient Medications on File Prior to Visit  Medication Sig Dispense Refill   citalopram (CELEXA) 10 MG tablet Take 10 mg by mouth daily.     colesevelam (WELCHOL) 625 MG tablet Take by mouth 2 (two) times daily with a meal.     Cyanocobalamin (VITAMIN B-12 PO) Take 1 tablet by mouth daily.     levalbuterol (XOPENEX HFA) 45 MCG/ACT inhaler Inhale 1-2 puffs into the lungs every 4 (four) hours as needed for wheezing. 1 each 12   METAMUCIL FIBER PO Take by mouth in the morning and at bedtime.     ondansetron (ZOFRAN-ODT) 4 MG disintegrating tablet TAKE 1 TABLET (4 MG TOTAL) BY MOUTH EVERY 8 HOURS AS NEEDED FOR NAUSEA 20 tablet 0   pantoprazole (PROTONIX) 40 MG tablet Take 1 tablet (40 mg total) by mouth daily as needed. 90 tablet 5   prazosin (MINIPRESS) 1 MG capsule Take 1 mg by mouth at bedtime.     Probiotic Product (ALIGN) 4 MG CAPS Take 1 capsule by mouth as needed.     promethazine-dextromethorphan (PROMETHAZINE-DM) 6.25-15 MG/5ML syrup Take 5 mLs by mouth 4 (four) times daily as needed for cough. 118 mL 0   QUEtiapine (SEROQUEL) 25 MG tablet Take 25 mg by mouth 3 (three) times daily.     RESTASIS 0.05 % ophthalmic emulsion 1 drop 2 (two) times daily.     vitamin C (ASCORBIC ACID) 500 MG tablet Take 500 mg by mouth daily.     [DISCONTINUED] Hyoscyamine-Phenyltoloxamine ([place] NF) [id number] MG CAPS Take one tablet by mouth as needed for abdominal cramping 24 each 0   No current facility-administered medications on file prior to visit.    Review of Systems     Objective:  There were no vitals filed for this visit. BP Readings from Last 3 Encounters:  03/10/22 124/80  02/03/22 118/72   11/12/21 102/68   Wt Readings from Last 3 Encounters:  03/10/22 143 lb (64.9 kg)  02/03/22 144 lb (65.3 kg)  12/11/21 130 lb (59 kg)   There is no height or weight on file to calculate BMI.    Physical Exam         Assessment & Plan:    See Problem List for Assessment and Plan of chronic medical problems.

## 2022-04-23 ENCOUNTER — Telehealth: Payer: Self-pay

## 2022-04-23 ENCOUNTER — Ambulatory Visit: Payer: 59 | Admitting: Internal Medicine

## 2022-04-23 ENCOUNTER — Ambulatory Visit (INDEPENDENT_AMBULATORY_CARE_PROVIDER_SITE_OTHER): Payer: 59

## 2022-04-23 VITALS — BP 112/80 | HR 60 | Temp 98.0°F | Ht 68.0 in | Wt 145.0 lb

## 2022-04-23 DIAGNOSIS — M549 Dorsalgia, unspecified: Secondary | ICD-10-CM

## 2022-04-23 DIAGNOSIS — R052 Subacute cough: Secondary | ICD-10-CM | POA: Diagnosis not present

## 2022-04-23 DIAGNOSIS — R079 Chest pain, unspecified: Secondary | ICD-10-CM

## 2022-04-23 LAB — POCT RESPIRATORY SYNCYTIAL VIRUS: RSV Rapid Ag: NEGATIVE

## 2022-04-23 LAB — POC INFLUENZA A&B (BINAX/QUICKVUE)
Influenza A, POC: NEGATIVE
Influenza B, POC: NEGATIVE

## 2022-04-23 LAB — POC COVID19 BINAXNOW: SARS Coronavirus 2 Ag: NEGATIVE

## 2022-04-23 MED ORDER — LEVALBUTEROL TARTRATE 45 MCG/ACT IN AERO: 1.0000 | INHALATION_SPRAY | RESPIRATORY_TRACT | 12 refills | Status: DC | PRN

## 2022-04-23 MED ORDER — FLUTICASONE-SALMETEROL 100-50 MCG/ACT IN AEPB: 1.0000 | INHALATION_SPRAY | Freq: Two times a day (BID) | RESPIRATORY_TRACT | 5 refills | Status: DC

## 2022-04-23 MED ORDER — MELOXICAM 15 MG PO TABS: 15.0000 mg | ORAL_TABLET | Freq: Every day | ORAL | 0 refills | Status: DC

## 2022-04-23 NOTE — Patient Instructions (Addendum)
     Have a chest xray downstairs.     Medications changes include :   meloxicam 15 mg daily with food x 3 weeks for the pain.  You can stop prior if the pain resolves.    Wixela inhaler twice a day - rinse mouth afterward.  Use the xopenex inhaler as needed.      Return if symptoms worsen or fail to improve.

## 2022-04-23 NOTE — Assessment & Plan Note (Signed)
New Feels the pain when she coughs, moves certain ways and takes deep breaths Heating pad and Goody powders have helped Pain sounds musculoskeletal in nature No tenderness with palpation across mid back or chest Stop Goody powders Start meloxicam 15 mg daily-take with food-will not prescribe more than 21 days-advised to stop when pain resolves

## 2022-04-23 NOTE — Addendum Note (Signed)
Addended by: Marcina Millard on: 04/23/2022 03:32 PM   Modules accepted: Orders

## 2022-04-23 NOTE — Assessment & Plan Note (Signed)
Having a dry, mild cough The cough is extremely mild, but she was concerned because she is having some right chest pain and pain across the mid back that is worse with coughing.  The pain is also worse with movement and taking deep breaths Cough may be related to asthma which she was diagnosed with years ago, postviral or other Lungs are clear She is using Xopenex on a daily basis and that does seem to help-advised to use this less often, but can still use it as needed Start Wixela 1 puff twice daily

## 2022-04-23 NOTE — Telephone Encounter (Signed)
Pt has called wanting to discuss chest x-ray more. She seen something on the report she wanted to ask about her lung's.  *I was able to inform pt about report and the meaning of Hyperinflation. Pt is asking for provider nurse to call her cause she has questions.

## 2022-04-23 NOTE — Telephone Encounter (Signed)
Spoke with patient and went over results again with her today.

## 2022-04-30 ENCOUNTER — Emergency Department (HOSPITAL_COMMUNITY): Payer: 59

## 2022-04-30 ENCOUNTER — Emergency Department (HOSPITAL_COMMUNITY)
Admission: EM | Admit: 2022-04-30 | Discharge: 2022-04-30 | Disposition: A | Discharge status: Home / Self Care | Payer: 59 | Attending: Emergency Medicine | Admitting: Emergency Medicine

## 2022-04-30 ENCOUNTER — Encounter (HOSPITAL_COMMUNITY): Payer: Self-pay

## 2022-04-30 ENCOUNTER — Telehealth: Payer: Self-pay | Admitting: Internal Medicine

## 2022-04-30 DIAGNOSIS — J45909 Unspecified asthma, uncomplicated: Secondary | ICD-10-CM | POA: Insufficient documentation

## 2022-04-30 DIAGNOSIS — Z87891 Personal history of nicotine dependence: Secondary | ICD-10-CM | POA: Diagnosis not present

## 2022-04-30 DIAGNOSIS — R197 Diarrhea, unspecified: Secondary | ICD-10-CM | POA: Insufficient documentation

## 2022-04-30 DIAGNOSIS — R1013 Epigastric pain: Secondary | ICD-10-CM | POA: Diagnosis not present

## 2022-04-30 DIAGNOSIS — R112 Nausea with vomiting, unspecified: Secondary | ICD-10-CM

## 2022-04-30 DIAGNOSIS — N289 Disorder of kidney and ureter, unspecified: Secondary | ICD-10-CM

## 2022-04-30 LAB — CBC
HCT: 36 % (ref 36.0–46.0)
Hemoglobin: 11.6 g/dL — ABNORMAL LOW (ref 12.0–15.0)
MCH: 29.3 pg (ref 26.0–34.0)
MCHC: 32.2 g/dL (ref 30.0–36.0)
MCV: 90.9 fL (ref 80.0–100.0)
Platelets: 212 10*3/uL (ref 150–400)
RBC: 3.96 MIL/uL (ref 3.87–5.11)
RDW: 14 % (ref 11.5–15.5)
WBC: 6 10*3/uL (ref 4.0–10.5)
nRBC: 0 % (ref 0.0–0.2)

## 2022-04-30 LAB — HEPATIC FUNCTION PANEL
ALT: 20 U/L (ref 0–44)
AST: 35 U/L (ref 15–41)
Albumin: 3.1 g/dL — ABNORMAL LOW (ref 3.5–5.0)
Alkaline Phosphatase: 45 U/L (ref 38–126)
Bilirubin, Direct: 0.3 mg/dL — ABNORMAL HIGH (ref 0.0–0.2)
Indirect Bilirubin: 0.6 mg/dL (ref 0.3–0.9)
Total Bilirubin: 0.9 mg/dL (ref 0.3–1.2)
Total Protein: 5.3 g/dL — ABNORMAL LOW (ref 6.5–8.1)

## 2022-04-30 LAB — BASIC METABOLIC PANEL
Anion gap: 11 (ref 5–15)
BUN: 14 mg/dL (ref 8–23)
CO2: 21 mmol/L — ABNORMAL LOW (ref 22–32)
Calcium: 8.4 mg/dL — ABNORMAL LOW (ref 8.9–10.3)
Chloride: 102 mmol/L (ref 98–111)
Creatinine, Ser: 0.74 mg/dL (ref 0.44–1.00)
GFR, Estimated: >60 mL/min (ref >60)
Glucose, Bld: 126 mg/dL — ABNORMAL HIGH (ref 70–99)
Potassium: 3.5 mmol/L (ref 3.5–5.1)
Sodium: 134 mmol/L — ABNORMAL LOW (ref 135–145)

## 2022-04-30 LAB — TROPONIN I (HIGH SENSITIVITY)
Troponin I (High Sensitivity): 4 ng/L (ref <18)
Troponin I (High Sensitivity): 4 ng/L (ref <18)

## 2022-04-30 LAB — LIPASE, BLOOD: Lipase: 28 U/L (ref 11–51)

## 2022-04-30 MED ORDER — ONDANSETRON HCL 4 MG PO TABS: 4.0000 mg | ORAL_TABLET | Freq: Four times a day (QID) | ORAL | 0 refills | Status: DC

## 2022-04-30 MED ORDER — IOHEXOL 350 MG/ML SOLN
75.0000 mL | Freq: Once | INTRAVENOUS | Status: AC | PRN
  Administered 2022-04-30: 75 mL via INTRAVENOUS

## 2022-04-30 MED ORDER — ALUM & MAG HYDROXIDE-SIMETH 200-200-20 MG/5ML PO SUSP
30.0000 mL | Freq: Once | ORAL | Status: AC
  Administered 2022-04-30: 30 mL via ORAL
  Filled 2022-04-30: qty 30

## 2022-04-30 MED ORDER — FENTANYL CITRATE PF 50 MCG/ML IJ SOSY
25.0000 ug | PREFILLED_SYRINGE | Freq: Once | INTRAMUSCULAR | Status: AC
  Administered 2022-04-30: 25 ug via INTRAVENOUS
  Filled 2022-04-30: qty 1

## 2022-04-30 MED ORDER — SODIUM CHLORIDE 0.9 % IV BOLUS
1000.0000 mL | Freq: Once | INTRAVENOUS | Status: AC
  Administered 2022-04-30: 1000 mL via INTRAVENOUS

## 2022-04-30 MED ORDER — FAMOTIDINE IN NACL 20-0.9 MG/50ML-% IV SOLN
20.0000 mg | Freq: Once | INTRAVENOUS | Status: AC
  Administered 2022-04-30: 20 mg via INTRAVENOUS
  Filled 2022-04-30: qty 50

## 2022-04-30 MED ORDER — LACTATED RINGERS IV BOLUS
1000.0000 mL | Freq: Once | INTRAVENOUS | Status: AC
  Administered 2022-04-30: 1000 mL via INTRAVENOUS

## 2022-04-30 MED ORDER — ONDANSETRON HCL 4 MG/2ML IJ SOLN
4.0000 mg | Freq: Once | INTRAMUSCULAR | Status: AC
  Administered 2022-04-30: 4 mg via INTRAVENOUS
  Filled 2022-04-30: qty 2

## 2022-04-30 MED ORDER — ACETAMINOPHEN 325 MG PO TABS
650.0000 mg | ORAL_TABLET | Freq: Once | ORAL | Status: AC
  Administered 2022-04-30: 650 mg via ORAL
  Filled 2022-04-30: qty 2

## 2022-04-30 NOTE — ED Provider Notes (Signed)
[facility] Emergency Department Provider Note MRN:  [id number]  Arrival date & time: 04/30/22     Chief Complaint   Emesis   History of Present Illness   [name] is a 77 y.o. year-old female with a history of IBS, fibromyalgia presenting to the ED with chief complaint of emesis.  Burning chest pain and epigastric pain for the past day or 2 followed by frequent diarrhea, 2 episodes of vomiting this evening.  No fever.  No abdominal pain.  Review of Systems  A thorough review of systems was obtained and all systems are negative except as noted in the HPI and PMH.   Patient's Health History    Past Medical History:  Diagnosis Date   Allergy    Anemia    Anxiety    Asthma    Cataract    bil cateracts removed   Complication of anesthesia    Depression    generalized anxiety disorder   Diverticulosis    Duodenal diverticulum    Family history of adverse reaction to anesthesia    Mother and Daughters- N/V   Fibromyalgia    Gastroesophageal reflux disease with hiatal hernia    Hiatal hernia    History of kidney stones    Hyperlipidemia    IBS (irritable bowel syndrome)    Lymphocytic colitis    Dr Sharlett Iles   Migraine headache    Mitral valve prolapse    Neuropathy    Osteopenia    BMD ordered by GYN   Osteoporosis    Peripheral neuropathy    treated as RLS by  Neurology   PONV (postoperative nausea and vomiting)    Restless leg syndrome    Schatzki's ring    History of   Sleep apnea    On CPAP, has not been using   Spondylosis     Past Surgical History:  Procedure Laterality Date   ANAL RECTAL MANOMETRY N/A 11/14/2015   Procedure: ANO RECTAL MANOMETRY;  Surgeon: Gatha Mayer, MD;  Location: [facility];  Service: Endoscopy;  Laterality: N/A;   BREAST ENHANCEMENT SURGERY     CATARACT EXTRACTION Bilateral    cataract surgery Left    Dr Bing Plume   CHOLECYSTECTOMY     COLONOSCOPY     ESOPHAGEAL DILATION     X 2    ROTATOR CUFF REPAIR Left    SINUS ENDO W/FUSION N/A 05/30/2014   Procedure: REVISION  FRONTAL SINUS SURGERY WITH FUSION SCAN;  Surgeon: Jerrell Belfast, MD;  Location: [facility];  Service: ENT;  Laterality: N/A;   SINUS SURGERY WITH INSTATRAK     x 2   TUBAL LIGATION     UPPER GASTROINTESTINAL ENDOSCOPY     Vaginal cystectomy     x 2   VAGINAL HYSTERECTOMY  05/2007   Vaginal repair, Dr Ubaldo Glassing.  Partial  hysterectomy.    Family History  Problem Relation Age of Onset   Hypertension Father 79   Stroke Father 51   Heart attack Father         ? in 21s   Hypertension Mother    Neuropathy Mother    Anemia Mother    Coronary artery disease Brother        Stent placement in 37s   Diabetes Maternal Uncle    Heart attack Paternal Uncle        SEVEN , ? age   Colon cancer Neg Hx    Seizures Neg Hx  Esophageal cancer Neg Hx    Rectal cancer Neg Hx    Stomach cancer Neg Hx     Social History   Socioeconomic History   Marital status: Married    Spouse name: Not on file   Number of children: 2   Years of education: 18   Highest education level: Not on file  Occupational History   Occupation: retired    Fish farm manager: [employer]  Tobacco Use   Smoking status: Former    Packs/day: 0.50    Years: 15.00    Total pack years: 7.50    Types: Cigarettes    Quit date: 03/03/1998    Years since quitting: 24.1   Smokeless tobacco: Never   Tobacco comments:    smoked 1973- 2006, up to <  1  ppd  Vaping Use   Vaping Use: Never used  Substance and Sexual Activity   Alcohol use: No    Alcohol/week: 0.0 standard drinks of alcohol   Drug use: No   Sexual activity: Yes    Partners: Male    Comment: 1st intercourse- 57, partners- 2, married- 22 yrs   Other Topics Concern   Not on file  Social History Narrative   HAS REGULAR EXERCISE   DAILY CAFFEINE: 1-2 CUPS   Patient is right handed.   Social Determinants of Health   Financial Resource Strain: Not on file  Food Insecurity: Not on file   Transportation Needs: Not on file  Physical Activity: Not on file  Stress: Not on file  Social Connections: Not on file  Intimate Partner Violence: Not on file     Physical Exam   Vitals:   04/30/22 0356 04/30/22 0403  BP:  124/67  Pulse:  72  Resp:  18  Temp:  97.6 F (36.4 C)  SpO2: 97% 96%    CONSTITUTIONAL: Well-appearing, moderate distress due to discomfort NEURO/PSYCH:  Alert and oriented x 3, no focal deficits EYES:  eyes equal and reactive ENT/NECK:  no LAD, no JVD CARDIO: Regular rate, well-perfused, normal S1 and S2 PULM:  CTAB no wheezing or rhonchi GI/GU:  non-distended, non-tender MSK/SPINE:  No gross deformities, no edema SKIN:  no rash, atraumatic   *Additional and/or pertinent findings included in MDM below  Diagnostic and Interventional Summary    EKG Interpretation  Date/Time:  Wednesday April 30 2022 04:12:36 EST Ventricular Rate:  65 PR Interval:  164 QRS Duration: 86 QT Interval:  393 QTC Calculation: 409 R Axis:   66 Text Interpretation: Sinus rhythm Supraventricular bigeminy Consider left atrial enlargement Low voltage, precordial leads Borderline T abnormalities, diffuse leads Confirmed by Gerlene Fee [phone]) on 04/30/2022 4:24:56 AM       Labs Reviewed  BASIC METABOLIC PANEL - Abnormal; Notable for the following components:      Result Value   Sodium 134 (*)    CO2 21 (*)    Glucose, Bld 126 (*)    Calcium 8.4 (*)    All other components within normal limits  CBC - Abnormal; Notable for the following components:   Hemoglobin 11.6 (*)    All other components within normal limits  HEPATIC FUNCTION PANEL - Abnormal; Notable for the following components:   Total Protein 5.3 (*)    Albumin 3.1 (*)    Bilirubin, Direct 0.3 (*)    All other components within normal limits  LIPASE, BLOOD  TROPONIN I (HIGH SENSITIVITY)  TROPONIN I (HIGH SENSITIVITY)    CT ABDOMEN PELVIS W CONTRAST  Final Result    DG Chest 2 View  Final  Result      Medications  lactated ringers bolus 1,000 mL (0 mLs Intravenous Stopped 04/30/22 0643)  ondansetron (ZOFRAN) injection 4 mg (4 mg Intravenous Given 04/30/22 0510)  famotidine (PEPCID) IVPB 20 mg premix (0 mg Intravenous Stopped 04/30/22 0602)  fentaNYL (SUBLIMAZE) injection 25 mcg (25 mcg Intravenous Given 04/30/22 0511)  alum & mag hydroxide-simeth (MAALOX/MYLANTA) 200-200-20 MG/5ML suspension 30 mL (30 mLs Oral Given 04/30/22 0512)  sodium chloride 0.9 % bolus 1,000 mL (1,000 mLs Intravenous New Bag/Given 04/30/22 0647)  iohexol (OMNIPAQUE) 350 MG/ML injection 75 mL (75 mLs Intravenous Contrast Given 04/30/22 [id number])     Procedures  /  Critical Care Procedures  ED Course and Medical Decision Making  Initial Impression and Ddx Given the nausea vomiting and diarrhea and burning chest/upper abdominal pain GI etiology is favored such as viral gastroenteritis.  Pancreatitis, cholecystitis, biliary colic also considered.  ACS considered but felt to be much less likely given the description.  Soft and nontender abdomen, no abdominal pain.  Currently without indication for advanced imaging.  Awaiting labs, symptomatic management, will reassess.  Past medical/surgical history that increases complexity of ED encounter: None  Interpretation of Diagnostics I personally reviewed the EKG and my interpretation is as follows: Sinus rhythm  Labs reassuring with no significant blood count or electrolyte disturbance, troponin negative  Patient Reassessment and Ultimate Disposition/Management     Awaiting second troponin, patient feeling better, CT without emergent process.  Anticipating discharge.  Signed out to oncoming provider.  Patient management required discussion with the following services or consulting groups:  None  Complexity of Problems Addressed Acute illness or injury that poses threat of life of bodily function  Additional Data Reviewed and Analyzed Further history obtained  from: Further history from spouse/family member and Prior labs/imaging results  Additional Factors Impacting ED Encounter Risk Use of parenteral controlled substances  Barth Kirks. Sedonia Small, MD [place] [email or web address]  Final Clinical Impressions(s) / ED Diagnoses     ICD-10-CM   1. Nausea vomiting and diarrhea  R11.2    R19.7       ED Discharge Orders     None        Discharge Instructions Discussed with and Provided to Patient:   Discharge Instructions   None      Maudie Flakes, MD 04/30/22 [phone]

## 2022-04-30 NOTE — Telephone Encounter (Signed)
Patient was just released from the hospital, but is still having issues with diarrhea. They would like to know if there is anything they can do or if there is something that can be prescribed. She has an appointment scheduled for 05/02/22. Best callback number is [phone]

## 2022-04-30 NOTE — ED Provider Notes (Signed)
Patient signed out to me at 0700 by Dr. Sedonia Small pending repeat troponin.  In short this is a 77 year old female with a past medical history of IBS and fibromyalgia that presented to the emergency department with nausea, vomiting and diarrhea with associated epigastric and chest pain.  Patient's initial EKG is nonischemic and troponin is negative.  Labs show no signs of significant dehydration she was treated with fluids and GI cocktail and Zofran.  Patient had CT abdomen and pelvis performed that showed no acute abnormalities.  Patient's repeat troponin was negative.  Upon my evaluation, the patient reports improvement of her pain but is still feeling nauseous.  She will be given additional dose of Zofran and will be given ginger ale for p.o. trial.  Patient is stable for discharge home with primary care follow-up and will be given strict return precautions.   Leanord Asal K, DO 04/30/22 [phone]

## 2022-04-30 NOTE — ED Triage Notes (Signed)
Pt comes from home via [facility] for n/v/d since last night, pt was hypotensive upon EMS arrival, PTA received 571m of fluid and '4mg'$  of zofran

## 2022-04-30 NOTE — Discharge Instructions (Addendum)
You were seen in the emergency department for your nausea and vomiting.  Your workup showed no signs of severe dehydration, no signs of heart attack and no obvious infection.  Your symptoms are likely due to a viral infection causing gastroenteritis which is an inflammation of the stomach that causes vomiting and diarrhea.  I have given you some nausea medication that you can take as needed as well as taking your daily antacid and Maalox as needed.  You should make sure that you are drinking plenty of fluids and staying well-hydrated and you can follow-up with your primary doctor in the next few days to have your symptoms rechecked.  Your CT scan also incidentally showed a lesion on one of your kidneys that should be followed up with by her primary doctor.  You should return to the emergency department for significantly worsening pain, repetitive vomiting despite the nausea medication or if you have any other new or concerning symptoms.

## 2022-05-01 ENCOUNTER — Encounter: Payer: Self-pay | Admitting: Internal Medicine

## 2022-05-01 DIAGNOSIS — N289 Disorder of kidney and ureter, unspecified: Secondary | ICD-10-CM | POA: Insufficient documentation

## 2022-05-01 DIAGNOSIS — N281 Cyst of kidney, acquired: Secondary | ICD-10-CM | POA: Insufficient documentation

## 2022-05-01 DIAGNOSIS — K551 Chronic vascular disorders of intestine: Secondary | ICD-10-CM | POA: Insufficient documentation

## 2022-05-01 NOTE — Progress Notes (Signed)
Subjective:    Patient ID: [name], female    DOB: 18-Jul-1945, 77 y.o.   MRN: [id number]     HPI Miyoko is here for follow up from the hospital   ED 04/30/22  for emesis, burning chest pain, epigastric pain x 1-2 days, frequent diarrhea.  She had 2 episodes of vomiting that evening.  No fever or abd pain.  Received IVF, zofran, pepcid, fentanyl, maalox.   EKG sinus.  W/u neg including Trop x 2 neg.  Basic labs,  Ct Ab/pelvis, cxr,  her symptoms improved - still had some nausea.    CT scan did show - . 16 mm left renal lesion since 2012 which could be solid neoplasm or complicated cyst. Recommend follow-up, preferably with enhanced renal MRI.  Atherosclerosis with notable plaque and potentially high-grade narrowing of the SMA.  Medications and allergies reviewed with patient and updated if appropriate.  Current Outpatient Medications on File Prior to Visit  Medication Sig Dispense Refill  . Cyanocobalamin (VITAMIN B-12 PO) Take 1 tablet by mouth daily.    . fluticasone-salmeterol (WIXELA INHUB) 100-50 MCG/ACT AEPB Inhale 1 puff into the lungs 2 (two) times daily. 60 each 5  . levalbuterol (XOPENEX HFA) 45 MCG/ACT inhaler Inhale 1-2 puffs into the lungs every 4 (four) hours as needed for wheezing. 1 each 12  . METAMUCIL FIBER PO Take by mouth in the morning and at bedtime. (Patient not taking: Reported on 08/13/2022)    . Probiotic Product (ALIGN) 4 MG CAPS Take 1 capsule by mouth as needed.    . RESTASIS 0.05 % ophthalmic emulsion 1 drop 2 (two) times daily.    . vitamin C (ASCORBIC ACID) 500 MG tablet Take 500 mg by mouth daily.    . [DISCONTINUED] Hyoscyamine-Phenyltoloxamine (DIGEX NF) 4.0981-19 MG CAPS Take one tablet by mouth as needed for abdominal cramping 24 each 0   No current facility-administered medications on file prior to visit.     Review of Systems     Objective:  There were no vitals filed for this visit. BP Readings from Last 3  Encounters:  08/13/22 100/60  07/21/22 90/60  06/04/22 120/74   Wt Readings from Last 3 Encounters:  08/13/22 132 lb 4 oz (60 kg)  07/21/22 135 lb (61.2 kg)  06/04/22 138 lb (62.6 kg)   There is no height or weight on file to calculate BMI.    Physical Exam     Lab Results  Component Value Date   WBC 8.1 08/13/2022   HGB 12.6 08/13/2022   HCT 38.4 08/13/2022   PLT 266.0 08/13/2022   GLUCOSE 94 08/13/2022   CHOL 131 06/12/2022   TRIG 76.0 06/12/2022   HDL 56.80 06/12/2022   LDLCALC 59 06/12/2022   ALT 14 08/13/2022   AST 16 08/13/2022   NA 135 08/13/2022   K 4.3 08/13/2022   CL 100 08/13/2022   CREATININE 0.65 08/13/2022   BUN 15 08/13/2022   CO2 28 08/13/2022   TSH 2.69 06/12/2022   INR 1.08 01/09/2009   HGBA1C 6.0 06/12/2022   MICROALBUR 2.5 (H) 01/06/2011   US Pelvic Complete With Transvaginal CLINICAL DATA:  Follow-up cystic lesion noted on MRI.  EXAM: TRANSABDOMINAL AND TRANSVAGINAL ULTRASOUND OF PELVIS  TECHNIQUE: Both transabdominal and transvaginal ultrasound examinations of the pelvis were performed. Transabdominal technique was performed for global imaging of the pelvis including uterus, ovaries, adnexal regions, and pelvic cul-de-sac. It was necessary to proceed with endovaginal exam  following the transabdominal exam to visualize the adnexa to better advantage.  COMPARISON:  MRI, 06/09/2022.  CT, 04/30/2022.  FINDINGS: Uterus  Surgically absent.  Right ovary  Not visualized.  No adnexal mass.  No cystic lesion.  Left ovary  Not visualized.  No adnexal mass.  No cystic lesion.  Other findings  No abnormal free fluid.  IMPRESSION: 1. No mass or abnormality. No cystic lesion seen to correspond to the right pelvic sidewall cyst reported from the prior MRI. On review of prior imaging, this cystic lesion appears present and without change from the CT dated 04/30/2022 and is likely present on a CT from 05/25/2010. This is almost  certainly a benign inclusion cyst. No additional follow-up is recommended.  Electronically Signed   By: Amie Portland M.D.   On: 07/14/2022 13:26    Assessment & Plan:    See Problem List for Assessment and Plan of chronic medical problems.    This encounter was created in error - please disregard.

## 2022-05-01 NOTE — Telephone Encounter (Signed)
Spoke with husband and spouse today. All questions answered.

## 2022-05-01 NOTE — Patient Instructions (Addendum)
      Blood work was ordered.   The lab is on the first floor.    Medications changes include :       An MRI of your abdomen was ordered.  A referral for vascular surgery was ordered.     Someone will call you to schedule an appointment.    No follow-ups on file.

## 2022-05-01 NOTE — Assessment & Plan Note (Signed)
Ct 04/30/22 - 16 mm left renal lesion since 2012  Further evaluation recommended by MRI --- ordered

## 2022-05-02 ENCOUNTER — Encounter: Payer: 59 | Admitting: Internal Medicine

## 2022-05-02 DIAGNOSIS — K551 Chronic vascular disorders of intestine: Secondary | ICD-10-CM

## 2022-05-02 DIAGNOSIS — N289 Disorder of kidney and ureter, unspecified: Secondary | ICD-10-CM

## 2022-05-11 ENCOUNTER — Other Ambulatory Visit: Payer: Self-pay | Admitting: Internal Medicine

## 2022-05-11 ENCOUNTER — Encounter: Payer: Self-pay | Admitting: Internal Medicine

## 2022-05-11 NOTE — Progress Notes (Unsigned)
    Subjective:    Patient ID: [name], female    DOB: 05/07/1945, 77 y.o.   MRN: [id number]     HPI Ladean is here for follow up from the hospital   ED 04/30/22  for emesis, burning chest pain, epigastric pain x 1-2 days, frequent diarrhea.  She had 2 episodes of vomiting that evening.  No fever or abd pain.  Received IVF, zofran, pepcid, fentanyl, maalox.   EKG sinus.  W/u neg including Trop x 2 neg.  Basic labs,  Ct Ab/pelvis, cxr,  her symptoms improved - still had some nausea.    CT scan did show - . 16 mm left renal lesion since 2012 which could be solid neoplasm or complicated cyst. Recommend follow-up, preferably with enhanced renal MRI.  Atherosclerosis with notable plaque and potentially high-grade narrowing of the SMA.  Medications and allergies reviewed with patient and updated if appropriate.  Current Outpatient Medications on File Prior to Visit  Medication Sig Dispense Refill   citalopram (CELEXA) 10 MG tablet Take 10 mg by mouth daily.     colesevelam (WELCHOL) 625 MG tablet Take by mouth 2 (two) times daily with a meal.     Cyanocobalamin (VITAMIN B-12 PO) Take 1 tablet by mouth daily.     fluticasone-salmeterol (WIXELA INHUB) 100-50 MCG/ACT AEPB Inhale 1 puff into the lungs 2 (two) times daily. 60 each 5   levalbuterol (XOPENEX HFA) 45 MCG/ACT inhaler Inhale 1-2 puffs into the lungs every 4 (four) hours as needed for wheezing. 1 each 12   meloxicam (MOBIC) 15 MG tablet Take 1 tablet (15 mg total) by mouth daily. Take with food 21 tablet 0   METAMUCIL FIBER PO Take by mouth in the morning and at bedtime.     ondansetron (ZOFRAN) 4 MG tablet Take 1 tablet (4 mg total) by mouth every 6 (six) hours. 12 tablet 0   ondansetron (ZOFRAN-ODT) 4 MG disintegrating tablet TAKE 1 TABLET (4 MG TOTAL) BY MOUTH EVERY 8 HOURS AS NEEDED FOR NAUSEA 20 tablet 0   pantoprazole (PROTONIX) 40 MG tablet Take 1 tablet (40 mg total) by mouth daily as needed. 90 tablet  5   prazosin (MINIPRESS) 1 MG capsule Take 1 mg by mouth at bedtime.     Probiotic Product (ALIGN) 4 MG CAPS Take 1 capsule by mouth as needed.     QUEtiapine (SEROQUEL) 25 MG tablet Take 25 mg by mouth 3 (three) times daily.     RESTASIS 0.05 % ophthalmic emulsion 1 drop 2 (two) times daily.     vitamin C (ASCORBIC ACID) 500 MG tablet Take 500 mg by mouth daily.     [DISCONTINUED] Hyoscyamine-Phenyltoloxamine (DIGEX NF) 0.0625-15 MG CAPS Take one tablet by mouth as needed for abdominal cramping 24 each 0   No current facility-administered medications on file prior to visit.     Review of Systems     Objective:  There were no vitals filed for this visit. BP Readings from Last 3 Encounters:  04/30/22 107/61  04/23/22 112/80  03/10/22 124/80   Wt Readings from Last 3 Encounters:  04/23/22 145 lb (65.8 kg)  03/10/22 143 lb (64.9 kg)  02/03/22 144 lb (65.3 kg)   There is no height or weight on file to calculate BMI.    Physical Exam     Lab Results  Component Value Date   WBC 6.0 04/30/2022   HGB 11.6 (L) 04/30/2022   HCT 36.0 04/30/2022     PLT 212 04/30/2022   GLUCOSE 126 (H) 04/30/2022   CHOL 176 01/21/2022   TRIG 53.0 01/21/2022   HDL 63.90 01/21/2022   LDLCALC 101 (H) 01/21/2022   ALT 20 04/30/2022   AST 35 04/30/2022   NA 134 (L) 04/30/2022   K 3.5 04/30/2022   CL 102 04/30/2022   CREATININE 0.74 04/30/2022   BUN 14 04/30/2022   CO2 21 (L) 04/30/2022   TSH 3.76 01/21/2022   INR 1.08 01/09/2009   HGBA1C 5.9 01/21/2022   MICROALBUR 2.5 (H) 01/06/2011   CT ABDOMEN PELVIS W CONTRAST CLINICAL DATA:  Nausea, vomiting, and diarrhea since last night. Hypotension  EXAM: CT ABDOMEN AND PELVIS WITH CONTRAST  TECHNIQUE: Multidetector CT imaging of the abdomen and pelvis was performed using the standard protocol following bolus administration of intravenous contrast.  RADIATION DOSE REDUCTION: This exam was performed according to the departmental  dose-optimization program which includes automated exposure control, adjustment of the mA and/or kV according to patient size and/or use of iterative reconstruction technique.  CONTRAST:  75mL OMNIPAQUE IOHEXOL 350 MG/ML SOLN  COMPARISON:  07/25/2010  FINDINGS: Lower chest: Partially covered breast implants are symmetric and unremarkable. Small low-density pericardial effusion that is stable.  Hepatobiliary: No focal liver abnormality.Cholecystectomy. No bile duct dilatation.  Pancreas: Unremarkable.  Spleen: Unremarkable.  Adrenals/Urinary Tract: Negative adrenals. Isodense lesion exophytic from the upper pole left kidney which measures 16 mm. This was not present on prior.  Stomach/Bowel: Prominent fluid content within small bowel without wall thickening. High-density in the proximal colon is likely ingested. Extensive left colonic diverticulosis. Uncomplicated duodenal diverticula  Vascular/Lymphatic: Ordinary atheromatous change for age. Atheromatous plaque fairly typical for age. There is prominent calcified plaque at the proximal SMA which could be a source of flow reducing stenosis. No mass or adenopathy.  Reproductive:Hysterectomy  Other: No ascites or pneumoperitoneum.  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 Left hip replacement.  IMPRESSION: 1. No bowel obstruction or wall thickening. 2. 16 mm left renal lesion since 2012 which could be solid neoplasm or complicated cyst. Recommend follow-up, preferably with enhanced renal MRI. 3. Atherosclerosis with notable plaque and potentially high-grade narrowing of the SMA. 4. Colonic diverticulosis.  Electronically Signed   By: Jonathan  Watts M.D.   On: 04/30/2022 06:27 DG Chest 2 View CLINICAL DATA:  Chest pain  EXAM: CHEST - 2 VIEW  COMPARISON:  04/23/2022  FINDINGS: The lungs are clear without focal pneumonia, edema, pneumothorax or pleural effusion. Lungs are  hyperexpanded. Interstitial markings are diffusely coarsened with chronic features. The cardio pericardial silhouette is enlarged. Bones are diffusely demineralized. Telemetry leads overlie the chest.  IMPRESSION: Chronic interstitial coarsening without acute cardiopulmonary findings.  Electronically Signed   By: Eric  Mansell M.D.   On: 04/30/2022 05:04    Assessment & Plan:    See Problem List for Assessment and Plan of chronic medical problems.    

## 2022-05-11 NOTE — Patient Instructions (Signed)
      Blood work was ordered.   The lab is on the first floor.    Medications changes include :     Start pepcid 40 mg daily for heartburn.  Continue the pantoprazole 40 mg daily for heartburn.  Once your heartburn is controlled you can try decreasing the pantoprazole to every other day.  Start crestor 5 mg daily for your cholesterol.    An MRI of your abdomen - someone will call you to schedule an appointment.    Return in about 2 months (around 07/12/2022) for follow up.

## 2022-05-12 ENCOUNTER — Ambulatory Visit: Payer: 59 | Admitting: Internal Medicine

## 2022-05-12 VITALS — BP 106/74 | HR 90 | Temp 98.0°F | Ht 68.0 in | Wt 142.0 lb

## 2022-05-12 DIAGNOSIS — I700 Atherosclerosis of aorta: Secondary | ICD-10-CM | POA: Insufficient documentation

## 2022-05-12 DIAGNOSIS — E871 Hypo-osmolality and hyponatremia: Secondary | ICD-10-CM | POA: Insufficient documentation

## 2022-05-12 DIAGNOSIS — I959 Hypotension, unspecified: Secondary | ICD-10-CM | POA: Insufficient documentation

## 2022-05-12 DIAGNOSIS — I952 Hypotension due to drugs: Secondary | ICD-10-CM

## 2022-05-12 DIAGNOSIS — E782 Mixed hyperlipidemia: Secondary | ICD-10-CM

## 2022-05-12 DIAGNOSIS — N281 Cyst of kidney, acquired: Secondary | ICD-10-CM

## 2022-05-12 DIAGNOSIS — K219 Gastro-esophageal reflux disease without esophagitis: Secondary | ICD-10-CM | POA: Diagnosis not present

## 2022-05-12 DIAGNOSIS — K551 Chronic vascular disorders of intestine: Secondary | ICD-10-CM

## 2022-05-12 LAB — COMPREHENSIVE METABOLIC PANEL
ALT: 16 U/L (ref 0–35)
AST: 19 U/L (ref 0–37)
Albumin: 4.2 g/dL (ref 3.5–5.2)
Alkaline Phosphatase: 56 U/L (ref 39–117)
BUN: 20 mg/dL (ref 6–23)
CO2: 30 mEq/L (ref 19–32)
Calcium: 10 mg/dL (ref 8.4–10.5)
Chloride: 100 mEq/L (ref 96–112)
Creatinine, Ser: 0.67 mg/dL (ref 0.40–1.20)
GFR: 83.83 mL/min (ref >60.00)
Glucose, Bld: 72 mg/dL (ref 70–99)
Potassium: 3.7 mEq/L (ref 3.5–5.1)
Sodium: 137 mEq/L (ref 135–145)
Total Bilirubin: 0.3 mg/dL (ref 0.2–1.2)
Total Protein: 6.8 g/dL (ref 6.0–8.3)

## 2022-05-12 MED ORDER — FAMOTIDINE 40 MG PO TABS: 40.0000 mg | ORAL_TABLET | Freq: Every day | ORAL | 1 refills | Status: DC

## 2022-05-12 MED ORDER — AZELASTINE-FLUTICASONE 137-50 MCG/ACT NA SUSP: 1.0000 | Freq: Two times a day (BID) | NASAL | 8 refills | Status: DC

## 2022-05-12 MED ORDER — ROSUVASTATIN CALCIUM 5 MG PO TABS: 5.0000 mg | ORAL_TABLET | Freq: Every day | ORAL | 3 refills | Status: DC

## 2022-05-12 MED ORDER — PANTOPRAZOLE SODIUM 40 MG PO TBEC: 40.0000 mg | DELAYED_RELEASE_TABLET | Freq: Every day | ORAL | 5 refills | Status: DC

## 2022-05-12 NOTE — Assessment & Plan Note (Addendum)
Chronic Gerd not controlled Continue pantoprazole 40 mg daily Start pepcid 40 mg daily Once very well controlled - can try to taper off pantoprazole slowly Follow-up in 2 months

## 2022-05-12 NOTE — Assessment & Plan Note (Signed)
CT scan done 04/30/2022-73m left renal lesion seen that has been present since 2012 MRI ordered for further evaluation is recommended

## 2022-05-12 NOTE — Assessment & Plan Note (Signed)
Seen on CT scan Reviewed that she has coronary artery atherosclerosis and aortic atherosclerosis Recommended starting a statin-start Crestor 5 mg daily Will recheck lipids at next visit

## 2022-05-12 NOTE — Assessment & Plan Note (Signed)
Seen on recent CT scan in the ED on 04/30/2022 Asymptomatic Denies any pain with eating Getting an MRI with contrast to evaluate the kidney lesion-will see what that shows and see if we need to have her see vascular

## 2022-05-12 NOTE — Assessment & Plan Note (Signed)
Chronic Has mild CAD and aortic atherosclerosis She agrees to try a statin Start Crestor 5 mg daily Will check lipid panel in 2 months when she follows up

## 2022-05-12 NOTE — Assessment & Plan Note (Signed)
With recent ED visit she did have mild hyponatremia which she is very concerned about Likely related to her diarrhea, emesis Will recheck CMP

## 2022-05-12 NOTE — Assessment & Plan Note (Signed)
Blood pressure has always been on the low side BP recently at home 90/55-117/66 Hypotension likely related to prazosin which she is taking at bedtime She is staying well-hydrated Advised talking to her psychiatrist about stopping this medication and seeing if her symptoms improve Continue stay well-hydrated

## 2022-06-04 ENCOUNTER — Encounter: Payer: Self-pay | Admitting: Internal Medicine

## 2022-06-04 ENCOUNTER — Ambulatory Visit: Payer: 59 | Admitting: Internal Medicine

## 2022-06-04 ENCOUNTER — Ambulatory Visit (INDEPENDENT_AMBULATORY_CARE_PROVIDER_SITE_OTHER): Payer: 59

## 2022-06-04 VITALS — BP 120/74 | HR 67 | Temp 97.9°F | Ht 68.0 in | Wt 138.0 lb

## 2022-06-04 DIAGNOSIS — M549 Dorsalgia, unspecified: Secondary | ICD-10-CM

## 2022-06-04 DIAGNOSIS — M791 Myalgia, unspecified site: Secondary | ICD-10-CM | POA: Diagnosis not present

## 2022-06-04 DIAGNOSIS — R5382 Chronic fatigue, unspecified: Secondary | ICD-10-CM

## 2022-06-04 DIAGNOSIS — R7303 Prediabetes: Secondary | ICD-10-CM

## 2022-06-04 DIAGNOSIS — I952 Hypotension due to drugs: Secondary | ICD-10-CM | POA: Diagnosis not present

## 2022-06-04 DIAGNOSIS — E782 Mixed hyperlipidemia: Secondary | ICD-10-CM

## 2022-06-04 MED ORDER — MIDODRINE HCL 5 MG PO TABS: 5.0000 mg | ORAL_TABLET | Freq: Two times a day (BID) | ORAL | 2 refills | Status: DC

## 2022-06-04 NOTE — Assessment & Plan Note (Signed)
She is seen some muscle pain-upper extremity and lower extremity Given other symptoms will rule out PMR ESR, CRP

## 2022-06-04 NOTE — Assessment & Plan Note (Signed)
Chronic Has mild CAD and aortic atherosclerosis Start Crestor 5 mg daily Lipid panel

## 2022-06-04 NOTE — Patient Instructions (Addendum)
     Have an xray downstairs.    Try the Tens unit at home.   Use the heat.    Blood work was ordered.   Have this done at 8 am.      Medications changes include :   midodrine 5 mg twice a day    A referral was ordered for PT at [facility]     Someone will call you to schedule an appointment.    Return in about 2 weeks (around 06/18/2022) for follow up, schedule lab appt at 8 am.

## 2022-06-04 NOTE — Assessment & Plan Note (Signed)
Subacute Sounds muscular in nature Will get thoracic x-ray given her osteoporosis, but spine is nontender Has elements of nerve pain-may have a pinched nerve Referral to PT at [facility] Consider orthopedic evaluation if not improving with PT Will hold off on muscle relaxers given possible side effects Tried TENS unit they have at home Continue heat

## 2022-06-04 NOTE — Progress Notes (Signed)
Subjective:    Patient ID: [name], female    DOB: 04-13-1945, 77 y.o.   MRN: [id number]      HPI [name] is here for  Chief Complaint  Patient presents with   Low blood pressure    Low blood pressure; Tingling in mid left shoulder blade; low energy; Patient is also having discomfort in left breast area; Patients get hot and weak when standing up.   She is here today with her husband.  BP running low -  105/66, 92/62, 89/55, 88/57, 100/62, 102/62, 94/56, 91/54, 116/66, 106/65.  No pattern to when it is lower or higher.  She is not sure if she feels worse when her blood pressure is low or not.  Gets dyspneic on exertion.  Also gets very fatigued with mild exertion and is not able to do much.  Often has to rest.  Drinking a good amount of fluids.  Not eating very well.  Burning/tingling pain in left shoulder blade  - burning pain - feels like a knife is going in there.  Going on for a few months.  Heat/massager helps.  She did not have it first thing when she woke  - then started when she was up and moving around.     Low energy.  Sleep is still not great-taking trazodone.  Still following with psychiatry.     Medications and allergies reviewed with patient and updated if appropriate.  Current Outpatient Medications on File Prior to Visit  Medication Sig Dispense Refill   Azelastine-Fluticasone 137-50 MCG/ACT SUSP Place 1 spray into the nose every 12 (twelve) hours. 23 g 8   Cyanocobalamin (VITAMIN B-12 PO) Take 1 tablet by mouth daily.     famotidine (PEPCID) 40 MG tablet Take 1 tablet (40 mg total) by mouth daily. For heartburn 90 tablet 1   fluticasone-salmeterol (WIXELA INHUB) 100-50 MCG/ACT AEPB Inhale 1 puff into the lungs 2 (two) times daily. 60 each 5   levalbuterol (XOPENEX HFA) 45 MCG/ACT inhaler Inhale 1-2 puffs into the lungs every 4 (four) hours as needed for wheezing. 1 each 12   METAMUCIL FIBER PO Take by mouth in the morning and at  bedtime.     pantoprazole (PROTONIX) 40 MG tablet Take 1 tablet (40 mg total) by mouth daily. 90 tablet 5   prazosin (MINIPRESS) 1 MG capsule Take 1 mg by mouth at bedtime.     Probiotic Product (ALIGN) 4 MG CAPS Take 1 capsule by mouth as needed.     RESTASIS 0.05 % ophthalmic emulsion 1 drop 2 (two) times daily.     rosuvastatin (CRESTOR) 5 MG tablet Take 1 tablet (5 mg total) by mouth daily. For cholesterol 90 tablet 3   traZODone (DESYREL) 50 MG tablet Take 50 mg by mouth at bedtime.     vitamin C (ASCORBIC ACID) 500 MG tablet Take 500 mg by mouth daily.     [DISCONTINUED] Hyoscyamine-Phenyltoloxamine ([place] NF) [id number] MG CAPS Take one tablet by mouth as needed for abdominal cramping 24 each 0   No current facility-administered medications on file prior to visit.    Review of Systems  Constitutional:  Positive for appetite change and fatigue.       Low stamina  Respiratory:  Positive for cough (pollen) and shortness of breath. Negative for wheezing.   Cardiovascular:  Positive for chest pain (left upper back - feels like from the back) and palpitations (occ). Negative for leg swelling.  Gastrointestinal:  Positive for  abdominal pain (sometimes) and nausea.       Stools vary between diarrhea and constipation - typically constipation  Genitourinary:  Negative for dysuria.  Musculoskeletal:  Positive for back pain and myalgias.  Neurological:  Positive for dizziness, light-headedness and headaches (sometimes).  Psychiatric/Behavioral:  Positive for dysphoric mood and sleep disturbance (not great with trazodone).        Objective:   Vitals:   06/04/22 1506  BP: 120/74  Pulse: 67  Temp: 97.9 F (36.6 C)  SpO2: 99%   BP Readings from Last 3 Encounters:  06/04/22 120/74  05/12/22 106/74  04/30/22 107/61   Wt Readings from Last 3 Encounters:  06/04/22 138 lb (62.6 kg)  05/12/22 142 lb (64.4 kg)  04/23/22 145 lb (65.8 kg)   Body mass index is 20.98 kg/m.    Physical  Exam Constitutional:      General: She is not in acute distress.    Appearance: She is not ill-appearing.     Comments: Chronically ill appearing  HENT:     Head: Normocephalic and atraumatic.  Eyes:     Conjunctiva/sclera: Conjunctivae normal.  Cardiovascular:     Rate and Rhythm: Normal rate and regular rhythm.     Heart sounds: No murmur heard. Pulmonary:     Effort: Pulmonary effort is normal. No respiratory distress.     Breath sounds: No wheezing or rales.  Abdominal:     General: There is no distension.     Palpations: Abdomen is soft.     Tenderness: There is no abdominal tenderness.  Musculoskeletal:        General: Tenderness (pain between left scapula and vertebra) present.     Cervical back: Neck supple. No tenderness.  Lymphadenopathy:     Cervical: No cervical adenopathy.  Skin:    General: Skin is warm and dry.     Findings: No rash.  Neurological:     Mental Status: She is alert. Mental status is at baseline.  Psychiatric:     Comments: Depressed affect            Assessment & Plan:    See Problem List for Assessment and Plan of chronic medical problems.

## 2022-06-04 NOTE — Assessment & Plan Note (Signed)
Chronic Check a1c Low sugar / carb diet Stressed regular exercise  

## 2022-06-04 NOTE — Assessment & Plan Note (Signed)
Chronic Her blood pressure has always been on the low side-typically systolic is [id number]- At home over the past several weeks blood pressure has been lower This could potentially be causing some of her symptoms Was on midodrine in the past and I think we need to restart this to see if increasing her blood pressure helps some Start midodrine 5 mg twice daily Monitor BP at home Follow-up in 2 weeks

## 2022-06-04 NOTE — Assessment & Plan Note (Signed)
Chronic fatigue Fatigue has been an issue for a while, but he is worse now ?  Related to low BP Blood work has generally been okay so no obvious cause there She is having some muscle pains so we will check ESR, CRP to rule out something like PMR ?  Adrenal issues-will check cortisol level-she will get this done first thing in the morning Not sleeping well which may be contributing ?  Cardiac issue-advised considering following up with her cardiologist to rule out cardiac cause Recheck CBC, CMP

## 2022-06-05 ENCOUNTER — Telehealth: Payer: Self-pay | Admitting: Internal Medicine

## 2022-06-05 ENCOUNTER — Other Ambulatory Visit: Payer: Self-pay

## 2022-06-05 MED ORDER — MIDODRINE HCL 5 MG PO TABS: 5.0000 mg | ORAL_TABLET | Freq: Two times a day (BID) | ORAL | 2 refills | Status: DC

## 2022-06-05 NOTE — Telephone Encounter (Signed)
Script resent

## 2022-06-05 NOTE — Telephone Encounter (Signed)
Patient called and said she spoke with her pharmacy and they said they did not receive the prescription for  midodrine (PROAMATINE) 5 MG tablet . The patient would like to know if it can be re-sent to [id number] - [place], [place]. Best call back is [phone].

## 2022-06-09 ENCOUNTER — Ambulatory Visit
Admission: RE | Admit: 2022-06-09 | Discharge: 2022-06-09 | Disposition: A | Discharge status: Home / Self Care | Payer: 59 | Source: Ambulatory Visit | Attending: Internal Medicine | Admitting: Internal Medicine

## 2022-06-09 DIAGNOSIS — N281 Cyst of kidney, acquired: Secondary | ICD-10-CM

## 2022-06-09 MED ORDER — GADOPICLENOL 0.5 MMOL/ML IV SOLN
6.0000 mL | Freq: Once | INTRAVENOUS | Status: AC | PRN
  Administered 2022-06-09: 6 mL via INTRAVENOUS

## 2022-06-12 ENCOUNTER — Other Ambulatory Visit (INDEPENDENT_AMBULATORY_CARE_PROVIDER_SITE_OTHER): Payer: 59

## 2022-06-12 DIAGNOSIS — R5382 Chronic fatigue, unspecified: Secondary | ICD-10-CM

## 2022-06-12 DIAGNOSIS — E782 Mixed hyperlipidemia: Secondary | ICD-10-CM | POA: Diagnosis not present

## 2022-06-12 DIAGNOSIS — R7303 Prediabetes: Secondary | ICD-10-CM | POA: Diagnosis not present

## 2022-06-12 DIAGNOSIS — I952 Hypotension due to drugs: Secondary | ICD-10-CM | POA: Diagnosis not present

## 2022-06-12 DIAGNOSIS — M791 Myalgia, unspecified site: Secondary | ICD-10-CM | POA: Diagnosis not present

## 2022-06-12 LAB — COMPREHENSIVE METABOLIC PANEL
ALT: 16 U/L (ref 0–35)
AST: 19 U/L (ref 0–37)
Albumin: 4.2 g/dL (ref 3.5–5.2)
Alkaline Phosphatase: 62 U/L (ref 39–117)
BUN: 17 mg/dL (ref 6–23)
CO2: 28 mEq/L (ref 19–32)
Calcium: 9.5 mg/dL (ref 8.4–10.5)
Chloride: 104 mEq/L (ref 96–112)
Creatinine, Ser: 0.64 mg/dL (ref 0.40–1.20)
GFR: 84.71 mL/min (ref >60.00)
Glucose, Bld: 96 mg/dL (ref 70–99)
Potassium: 4.6 mEq/L (ref 3.5–5.1)
Sodium: 140 mEq/L (ref 135–145)
Total Bilirubin: 0.3 mg/dL (ref 0.2–1.2)
Total Protein: 6.4 g/dL (ref 6.0–8.3)

## 2022-06-12 LAB — CBC WITH DIFFERENTIAL/PLATELET
Basophils Absolute: 0.1 10*3/uL (ref 0.0–0.1)
Basophils Relative: 1.2 % (ref 0.0–3.0)
Eosinophils Absolute: 0.1 10*3/uL (ref 0.0–0.7)
Eosinophils Relative: 2.1 % (ref 0.0–5.0)
HCT: 37.9 % (ref 36.0–46.0)
Hemoglobin: 12.7 g/dL (ref 12.0–15.0)
Lymphocytes Relative: 18.6 % (ref 12.0–46.0)
Lymphs Abs: 1.1 10*3/uL (ref 0.7–4.0)
MCHC: 33.5 g/dL (ref 30.0–36.0)
MCV: 89 fl (ref 78.0–100.0)
Monocytes Absolute: 0.5 10*3/uL (ref 0.1–1.0)
Monocytes Relative: 7.8 % (ref 3.0–12.0)
Neutro Abs: 4.2 10*3/uL (ref 1.4–7.7)
Neutrophils Relative %: 70.3 % (ref 43.0–77.0)
Platelets: 248 10*3/uL (ref 150.0–400.0)
RBC: 4.26 Mil/uL (ref 3.87–5.11)
RDW: 15 % (ref 11.5–15.5)
WBC: 6 10*3/uL (ref 4.0–10.5)

## 2022-06-12 LAB — LIPID PANEL
Cholesterol: 131 mg/dL (ref 0–200)
HDL: 56.8 mg/dL (ref >39.00)
LDL Cholesterol: 59 mg/dL (ref 0–99)
NonHDL: 74.47
Total CHOL/HDL Ratio: 2
Triglycerides: 76 mg/dL (ref 0.0–149.0)
VLDL: 15.2 mg/dL (ref 0.0–40.0)

## 2022-06-12 LAB — CORTISOL: Cortisol, Plasma: 10.7 ug/dL

## 2022-06-12 LAB — C-REACTIVE PROTEIN: CRP: <1 mg/dL (ref 0.5–20.0)

## 2022-06-12 LAB — HEMOGLOBIN A1C: Hgb A1c MFr Bld: 6 % (ref 4.6–6.5)

## 2022-06-12 LAB — TSH: TSH: 2.69 u[IU]/mL (ref 0.35–5.50)

## 2022-06-12 LAB — SEDIMENTATION RATE: Sed Rate: 10 mm/hr (ref 0–30)

## 2022-06-14 ENCOUNTER — Other Ambulatory Visit: Payer: Self-pay | Admitting: Internal Medicine

## 2022-06-14 DIAGNOSIS — N83201 Unspecified ovarian cyst, right side: Secondary | ICD-10-CM

## 2022-06-14 DIAGNOSIS — N949 Unspecified condition associated with female genital organs and menstrual cycle: Secondary | ICD-10-CM

## 2022-06-18 ENCOUNTER — Ambulatory Visit: Payer: 59 | Admitting: Internal Medicine

## 2022-06-30 ENCOUNTER — Ambulatory Visit: Payer: 59 | Admitting: Medical

## 2022-07-09 ENCOUNTER — Ambulatory Visit
Admission: RE | Admit: 2022-07-09 | Discharge: 2022-07-09 | Disposition: A | Discharge status: Home / Self Care | Payer: 59 | Source: Ambulatory Visit | Attending: Internal Medicine | Admitting: Internal Medicine

## 2022-07-09 DIAGNOSIS — N83201 Unspecified ovarian cyst, right side: Secondary | ICD-10-CM

## 2022-07-11 ENCOUNTER — Ambulatory Visit: Payer: 59 | Admitting: Internal Medicine
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
## 2022-07-21 ENCOUNTER — Ambulatory Visit: Payer: 59 | Attending: Medical | Admitting: Cardiovascular Disease

## 2022-07-21 ENCOUNTER — Encounter: Payer: Self-pay | Admitting: Cardiovascular Disease

## 2022-07-21 VITALS — BP 90/60 | HR 59 | Ht 68.0 in | Wt 135.0 lb

## 2022-07-21 DIAGNOSIS — I951 Orthostatic hypotension: Secondary | ICD-10-CM | POA: Diagnosis not present

## 2022-07-21 DIAGNOSIS — F418 Other specified anxiety disorders: Secondary | ICD-10-CM | POA: Diagnosis not present

## 2022-07-21 DIAGNOSIS — E782 Mixed hyperlipidemia: Secondary | ICD-10-CM | POA: Diagnosis not present

## 2022-07-21 DIAGNOSIS — R079 Chest pain, unspecified: Secondary | ICD-10-CM | POA: Diagnosis not present

## 2022-07-21 DIAGNOSIS — R002 Palpitations: Secondary | ICD-10-CM

## 2022-07-21 DIAGNOSIS — F411 Generalized anxiety disorder: Secondary | ICD-10-CM

## 2022-07-21 DIAGNOSIS — R0602 Shortness of breath: Secondary | ICD-10-CM

## 2022-07-21 DIAGNOSIS — R5382 Chronic fatigue, unspecified: Secondary | ICD-10-CM

## 2022-07-21 MED ORDER — RANOLAZINE ER 1000 MG PO TB12: 1000.0000 mg | ORAL_TABLET | Freq: Two times a day (BID) | ORAL | 3 refills | Status: DC

## 2022-07-21 NOTE — Patient Instructions (Addendum)
Medication Instructions:  Please start ranexa 500 mg twice a day for 1 week, Then increase up to 1000 mg twice a day  If you need a refill on your cardiac medications before your next appointment, please call your pharmacy.   Lab work: No new labs needed  Testing/Procedures: No new testing needed  Follow-Up: At [facility], you and your health needs are our priority.  As part of our continuing mission to provide you with exceptional heart care, we have created designated Provider Care Teams.  These Care Teams include your primary Cardiologist (physician) and Advanced Practice Providers (APPs -  Physician Assistants and Nurse Practitioners) who all work together to provide you with the care you need, when you need it.  You will need a follow up appointment in 12 months  Providers on your designated Care Team:   Nicolasa Ducking, NP Eula Listen, PA-C Cadence Fransico Michael, [place]  COVID-19 Vaccine Information can be found at: [email or web address] For questions related to vaccine distribution or appointments, please email [email or web address] .[email or web address] or call [phone].

## 2022-08-11 ENCOUNTER — Telehealth: Payer: Self-pay | Admitting: Internal Medicine

## 2022-08-11 NOTE — Telephone Encounter (Signed)
Pt was made aware of Dr. Leone Payor recommendations: Pt encouraged to drink lots of fluids. Pt verbalized understanding with all questions answered.

## 2022-08-11 NOTE — Telephone Encounter (Signed)
PT is having a colitis flare and seeking options for relief. Please advise.

## 2022-08-11 NOTE — Telephone Encounter (Signed)
Pt stated that she started yesterday having Abdominal Pain left side, Diarrhea and nausea and states that she feels that she is having a colitis Flare.   Pt states that every thing she eats she has diarrhea shortly after. Feels weeks. Pt was scheduled to see Alcide Evener NP on 08/13/2022 with Alcide Evener NP. Pt is requesting that if possible a prescription was sent to [facility] prior to office visit.  Please advise.

## 2022-08-11 NOTE — Telephone Encounter (Signed)
She can try loperamide (Imodium AD) 1-2 tid

## 2022-08-13 ENCOUNTER — Encounter: Payer: Self-pay | Admitting: Nurse Practitioner

## 2022-08-13 ENCOUNTER — Other Ambulatory Visit (INDEPENDENT_AMBULATORY_CARE_PROVIDER_SITE_OTHER): Payer: 59

## 2022-08-13 ENCOUNTER — Ambulatory Visit: Payer: 59 | Admitting: Nurse Practitioner

## 2022-08-13 VITALS — BP 100/60 | HR 62 | Ht 68.0 in | Wt 132.2 lb

## 2022-08-13 DIAGNOSIS — K5900 Constipation, unspecified: Secondary | ICD-10-CM

## 2022-08-13 DIAGNOSIS — K582 Mixed irritable bowel syndrome: Secondary | ICD-10-CM | POA: Diagnosis not present

## 2022-08-13 LAB — CBC WITH DIFFERENTIAL/PLATELET
Basophils Absolute: 0.1 10*3/uL (ref 0.0–0.1)
Basophils Relative: 0.7 % (ref 0.0–3.0)
Eosinophils Absolute: 0.1 10*3/uL (ref 0.0–0.7)
Eosinophils Relative: 1 % (ref 0.0–5.0)
HCT: 38.4 % (ref 36.0–46.0)
Hemoglobin: 12.6 g/dL (ref 12.0–15.0)
Lymphocytes Relative: 14.8 % (ref 12.0–46.0)
Lymphs Abs: 1.2 10*3/uL (ref 0.7–4.0)
MCHC: 32.8 g/dL (ref 30.0–36.0)
MCV: 90.6 fl (ref 78.0–100.0)
Monocytes Absolute: 0.6 10*3/uL (ref 0.1–1.0)
Monocytes Relative: 6.9 % (ref 3.0–12.0)
Neutro Abs: 6.2 10*3/uL (ref 1.4–7.7)
Neutrophils Relative %: 76.6 % (ref 43.0–77.0)
Platelets: 266 10*3/uL (ref 150.0–400.0)
RBC: 4.24 Mil/uL (ref 3.87–5.11)
RDW: 15.3 % (ref 11.5–15.5)
WBC: 8.1 10*3/uL (ref 4.0–10.5)

## 2022-08-13 LAB — COMPREHENSIVE METABOLIC PANEL
ALT: 14 U/L (ref 0–35)
AST: 16 U/L (ref 0–37)
Albumin: 4.2 g/dL (ref 3.5–5.2)
Alkaline Phosphatase: 52 U/L (ref 39–117)
BUN: 15 mg/dL (ref 6–23)
CO2: 28 mEq/L (ref 19–32)
Calcium: 9.2 mg/dL (ref 8.4–10.5)
Chloride: 100 mEq/L (ref 96–112)
Creatinine, Ser: 0.65 mg/dL (ref 0.40–1.20)
GFR: 84.29 mL/min (ref >60.00)
Glucose, Bld: 94 mg/dL (ref 70–99)
Potassium: 4.3 mEq/L (ref 3.5–5.1)
Sodium: 135 mEq/L (ref 135–145)
Total Bilirubin: 0.4 mg/dL (ref 0.2–1.2)
Total Protein: 6.8 g/dL (ref 6.0–8.3)

## 2022-08-13 NOTE — Patient Instructions (Addendum)
Dulcolax suppositories- take 1 per rectum every night as needed( use if stool is in rectum but doesn't pass)  Kegel Exercises  Kegel exercises can help strengthen your pelvic floor muscles. The pelvic floor is a group of muscles that support your rectum, small intestine, and bladder. In females, pelvic floor muscles also help support the uterus. These muscles help you control the flow of urine and stool (feces). Kegel exercises are painless and simple. They do not require any equipment. Your provider may suggest Kegel exercises to: Improve bladder and bowel control. Improve sexual response. Improve weak pelvic floor muscles after surgery to remove the uterus (hysterectomy) or after pregnancy, in females. Improve weak pelvic floor muscles after prostate gland removal or surgery, in males. Kegel exercises involve squeezing your pelvic floor muscles. These are the same muscles you squeeze when you try to stop the flow of urine or keep from passing gas. The exercises can be done while sitting, standing, or lying down, but it is best to vary your position. Ask your health care provider which exercises are safe for you. Do exercises exactly as told by your health care provider and adjust them as directed. Do not begin these exercises until told by your health care provider. Exercises How to do Kegel exercises: Squeeze your pelvic floor muscles tight. You should feel a tight lift in your rectal area. If you are a female, you should also feel a tightness in your vaginal area. Keep your stomach, buttocks, and legs relaxed. Hold the muscles tight for up to 10 seconds. Breathe normally. Relax your muscles for up to 10 seconds. Repeat as told by your health care provider. Repeat this exercise daily as told by your health care provider. Continue to do this exercise for at least 4-6 weeks, or for as long as told by your health care provider. You may be referred to a physical therapist who can help you learn  more about how to do Kegel exercises. Depending on your condition, your health care provider may recommend: Varying how long you squeeze your muscles. Doing several sets of exercises every day. Doing exercises for several weeks. Making Kegel exercises a part of your regular exercise routine. This information is not intended to replace advice given to you by your health care provider. Make sure you discuss any questions you have with your health care provider. Document Revised: 06/28/2020 Document Reviewed: 06/28/2020 Elsevier Patient Education  2024 [employer].  Due to recent changes in healthcare laws, you may see the results of your imaging and laboratory studies on MyChart before your provider has had a chance to review them.  We understand that in some cases there may be results that are confusing or concerning to you. Not all laboratory results come back in the same time frame and the provider may be waiting for multiple results in order to interpret others.  Please give [place] 48 hours in order for your provider to thoroughly review all the results before contacting the office for clarification of your results.   Thank you for trusting me with your gastrointestinal care!   Alcide Evener, CRNP

## 2022-08-13 NOTE — Progress Notes (Signed)
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 She is known by Dr. Leone Payor. She contacted our office on 08/11/22 with complaints of nausea, LLQ pain and diarrhea. She was concerned about having a possible lymphocytic colitis flare. She was instructed to take Imodium and was scheduled for a follow up appointment today. She took 2 Imodium tablets on Monday 6/10 and has not passed a BM since then. She stopped taking Metamucil 2 months ago and questions if she should try a different fiber supplement. She is taking a stool softener daily. She stated "I am scared to death that I have Crohn's disease". I explained to the patient that her past image studies and colonoscopy results did not identify Crohn's disease. She has mild nausea without vomiting x 2 months. Nausea is notably less after she passed a diarrhea BM.   Colonoscopy December 09, 2019 at which point she was having a lot of diarrhea Small colonic polyps s/p polypectomy.  Both were adenomatous -Moderate sigmoid diverticulosis. -Otherwise normal colonoscopy to TI. -S/P random colonic and TI biopsies.  TI biopsies were normal colon biopsies demonstrated lymphocytic colitis  CTAP 04/30/2022: FINDINGS: Lower chest: Partially covered breast implants are symmetric and unremarkable. Small low-density pericardial effusion that is stable.   Hepatobiliary: No focal liver abnormality.Cholecystectomy. No bile duct dilatation.   Pancreas: Unremarkable.   Spleen: Unremarkable.   Adrenals/Urinary Tract: Negative adrenals. Isodense lesion exophytic from the upper pole left kidney which measures 16 mm. This was not present on prior.    Stomach/Bowel: Prominent fluid content within small bowel without wall thickening. High-density in the proximal colon is likely ingested. Extensive left colonic diverticulosis. Uncomplicated duodenal diverticula   Vascular/Lymphatic: Ordinary atheromatous change for age. Atheromatous plaque fairly typical for age. There is prominent calcified plaque at the proximal SMA which could be a source of flow reducing stenosis. No mass or adenopathy.   Reproductive:Hysterectomy   Other: No ascites or pneumoperitoneum.   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 Left hip replacement.   IMPRESSION: 1. No bowel obstruction or wall thickening. 2. 16 mm left renal lesion since 2012 which could be solid neoplasm or complicated cyst. Recommend follow-up, preferably with enhanced renal MRI. 3. Atherosclerosis with notable plaque and potentially high-grade narrowing of the SMA. 4. Colonic diverticulosis.  Abdominal MRI w/wo contrast 06/09/2022: FINDINGS: Lower chest: No acute findings.   Hepatobiliary: No mass or other parenchymal abnormality identified. No bile duct dilation. Cholecystectomy.   Pancreas: No mass, inflammatory changes, or other parenchymal abnormality identified.   Spleen:  Within normal limits in size and appearance.   Adrenals/Urinary Tract: No adrenal nodules. No suspicious renal masses identified. No evidence of hydronephrosis. Queried exophytic left upper pole lesion measuring 1.4 cm corresponds to a hemorrhagic/proteinaceous cyst. No specific follow-up imaging recommended.   Stomach/Bowel: Large duodenal diverticulum.   Vascular/Lymphatic: No pathologically enlarged lymph nodes identified. No abdominal aortic aneurysm demonstrated.   Other: Partially imaged 1.8 cm T2 hyperintense simple-appearing cystic focus along the right pelvic sidewall (3:14)   Musculoskeletal: No suspicious bone lesions identified. Partially imaged  breast implants.   IMPRESSION: 1. Queried exophytic 0.4 cm left upper pole renal lesion corresponds to a hemorrhagic/proteinaceous cyst. No specific follow-up imaging recommended. 2. Partially imaged  1.8 cm T2 hyperintense simple-appearing cystic focus along the right pelvic sidewall, likely a benign ovarian/paraovarian cyst. Pelvic ultrasound examination can be considered for further evaluation as clinically indicated. 3. Large duodenal diverticulum.  Pelvic sono 07/09/2022: 1. No mass or abnormality. No cystic lesion seen to correspond to the right pelvic sidewall cyst reported from the prior MRI. On review of prior imaging, this cystic lesion appears present and without change from the CT dated 04/30/2022 and is likely present on a CT from 05/25/2010. This is almost certainly a benign inclusion cyst. No additional follow-up is recommended.    Current Outpatient Medications on File Prior to Visit  Medication Sig Dispense Refill   Azelastine-Fluticasone 137-50 MCG/ACT SUSP Place 1 spray into the nose every 12 (twelve) hours. 23 g 8   Cyanocobalamin (VITAMIN B-12 PO) Take 1 tablet by mouth daily.     famotidine (PEPCID) 40 MG tablet Take 1 tablet (40 mg total) by mouth daily. For heartburn 90 tablet 1   FLUoxetine (PROZAC) 10 MG capsule Take 10 mg by mouth daily.     fluticasone-salmeterol (WIXELA INHUB) 100-50 MCG/ACT AEPB Inhale 1 puff into the lungs 2 (two) times daily. 60 each 5   levalbuterol (XOPENEX HFA) 45 MCG/ACT inhaler Inhale 1-2 puffs into the lungs every 4 (four) hours as needed for wheezing. 1 each 12   midodrine (PROAMATINE) 5 MG tablet Take 1 tablet (5 mg total) by mouth 2 (two) times daily with a meal. 60 tablet 2   pantoprazole (PROTONIX) 40 MG tablet Take 1 tablet (40 mg total) by mouth daily. 90 tablet 5   Probiotic Product (ALIGN) 4 MG CAPS Take 1 capsule by mouth as needed.     ranolazine (RANEXA) 1000 MG SR tablet Take 1 tablet (1,000 mg total) by mouth 2 (two)  times daily. Please take 0.5 tablet (500 mg) twice a day times one week and then increase to 1 tablet (1,000 mg) twice daily. (Patient taking differently: Take 500 mg by mouth 2 (two) times daily. Please take 0.5 tablet (500 mg) twice a day times one week and then increase to 1 tablet (1,000 mg) twice daily.) 180 tablet 3   RESTASIS 0.05 % ophthalmic emulsion 1 drop 2 (two) times daily.     rosuvastatin (CRESTOR) 5 MG tablet Take 1 tablet (5 mg total) by mouth daily. For cholesterol 90 tablet 3   traZODone (DESYREL) 50 MG tablet Take 50 mg by mouth at bedtime.     vitamin C (ASCORBIC ACID) 500 MG tablet Take 500 mg by mouth daily.     METAMUCIL FIBER PO Take by mouth in the morning and at bedtime. (Patient not taking: Reported on 08/13/2022)     [DISCONTINUED] Hyoscyamine-Phenyltoloxamine (DIGEX NF) 1.6109-60 MG CAPS Take one tablet by mouth as needed for abdominal cramping 24 each 0   No current facility-administered medications on file prior to visit.   Allergies  Allergen Reactions   Tape Rash   Albuterol     heartburn   Cymbalta [Duloxetine Hcl]     Diarrhea, nausea, anxiety worse, shaky   Latex Other (See Comments)   Lyrica [Pregabalin] Other (See Comments)    Sedation   Neurontin [Gabapentin] Other (See Comments)    Sedation   Nitrofuran Derivatives Other (See Comments)    "tingling"   Paxil [Paroxetine Hcl]     Bowel upset, tingling   Prozac [Fluoxetine Hcl] Other (See Comments)    Made patient worse and constipation    Current Medications, Allergies,  Past Medical History, Past Surgical History, Family History and Social History were reviewed in [employer] record.   Review of Systems:   Constitutional: Negative for fever, sweats, chills or weight loss.  Respiratory: Negative for shortness of breath.   Cardiovascular: Negative for chest pain, palpitations and leg swelling.  Gastrointestinal: See HPI.  Musculoskeletal: Negative for back pain or muscle  aches.  Neurological: Negative for dizziness, headaches or paresthesias.   Physical Exam: BP 100/60 (BP Location: Left Arm, Patient Position: Sitting, Cuff Size: Normal)   Pulse 62   Ht 5\' 8"  (1.727 m)   Wt 132 lb 4 oz (60 kg)   SpO2 94%   BMI 20.11 kg/m  General: 77 year old female in no acute distress. Head: Normocephalic and atraumatic. Eyes: No scleral icterus. Conjunctiva pink . Ears: Normal auditory acuity. Mouth: Dentition intact. No ulcers or lesions.  Lungs: Clear throughout to auscultation. Heart: Regular rate and rhythm, no murmur. Abdomen: Soft, nontender and nondistended. No masses or hepatomegaly. Normal bowel sounds x 4 quadrants.  Rectal: No stool in the rectal vault. Poor anal sphincter tone. DD CMA present during exam.   Musculoskeletal: Symmetrical with no gross deformities. Extremities: No edema. Neurological: Alert oriented x 4. No focal deficits.  Psychological: Alert and cooperative. Normal mood and affect  Assessment and Recommendations:  77 year old female with lymphocytic colitis identified per colonoscopy 10/2019 with recent diarrhea which abated after she took Imodium 2 tabs. No BM x 2 days.  -Check stool cultures if diarrhea recurs  -Try Benefiber 1 tablespoon daily  -Colace stool softener as needed  -Dulcolax suppository if no BM in a few days, if patient feels stool in rectum   LLQ pain -CBC, CMP -CTAP if LLQ pain recurs   Pelvic floor dysfunction/dyssynergic  Kegel's exercise handout given to patient   Colon cancer screening  -No further screening colonoscopies due to age

## 2022-09-23 ENCOUNTER — Ambulatory Visit (INDEPENDENT_AMBULATORY_CARE_PROVIDER_SITE_OTHER): Payer: 59

## 2022-09-23 ENCOUNTER — Ambulatory Visit (INDEPENDENT_AMBULATORY_CARE_PROVIDER_SITE_OTHER): Payer: 59 | Admitting: Emergency Medicine

## 2022-09-23 ENCOUNTER — Encounter: Payer: Self-pay | Admitting: Emergency Medicine

## 2022-09-23 VITALS — BP 118/66 | HR 63 | Temp 98.1°F | Ht 68.0 in | Wt 130.5 lb

## 2022-09-23 DIAGNOSIS — R053 Chronic cough: Secondary | ICD-10-CM

## 2022-09-23 DIAGNOSIS — J4540 Moderate persistent asthma, uncomplicated: Secondary | ICD-10-CM | POA: Diagnosis not present

## 2022-09-23 DIAGNOSIS — J22 Unspecified acute lower respiratory infection: Secondary | ICD-10-CM | POA: Diagnosis not present

## 2022-09-23 MED ORDER — BENZONATATE 200 MG PO CAPS
200.0000 mg | ORAL_CAPSULE | Freq: Two times a day (BID) | ORAL | 0 refills | Status: DC | PRN
Start: 2022-09-23 — End: 2022-10-08

## 2022-09-23 MED ORDER — AZITHROMYCIN 250 MG PO TABS
ORAL_TABLET | ORAL | 0 refills | Status: DC
Start: 2022-09-23 — End: 2022-10-08

## 2022-09-23 MED ORDER — BREZTRI AEROSPHERE 160-9-4.8 MCG/ACT IN AERO
2.0000 | INHALATION_SPRAY | Freq: Two times a day (BID) | RESPIRATORY_TRACT | 11 refills | Status: DC
Start: 2022-09-23 — End: 2023-06-02

## 2022-09-23 NOTE — Assessment & Plan Note (Signed)
Clinically stable.  No signs of pneumonia. Persistent cough with changes in sputum color and consistency Most likely secondary bacterial infection No pneumonia on x-ray Recommend daily azithromycin for 5 days

## 2022-09-23 NOTE — Progress Notes (Signed)
[name] 77 y.o.   Chief Complaint  Patient presents with   Cough    Patient states she has had this cough for some time now. She thinks it could be her asthma.  Ears are hurting, hurting in her chest and back from coughing     HISTORY OF PRESENT ILLNESS: Acute problem visit today.  Patient of Dr. Cheryll Cockayne. This is a 77 y.o. female complaining of productive cough for the past couple of months. History of asthma.  Compliant with inhalers. Complaining of lack of energy.   Cough Associated symptoms include wheezing. Pertinent negatives include no chest pain, fever, headaches, hemoptysis, rash, sore throat or shortness of breath.     Prior to Admission medications   Medication Sig Start Date End Date Taking? Authorizing Provider  Azelastine-Fluticasone 137-50 MCG/ACT SUSP Place 1 spray into the nose every 12 (twelve) hours. 05/12/22   Pincus Sanes, MD  Cyanocobalamin (VITAMIN B-12 PO) Take 1 tablet by mouth daily.    [provider]  famotidine (PEPCID) 40 MG tablet Take 1 tablet (40 mg total) by mouth daily. For heartburn 05/12/22   Pincus Sanes, MD  FLUoxetine (PROZAC) 10 MG capsule Take 10 mg by mouth daily. 07/02/22   [provider]  fluticasone-salmeterol (WIXELA INHUB) 100-50 MCG/ACT AEPB Inhale 1 puff into the lungs 2 (two) times daily. 04/23/22   Pincus Sanes, MD  levalbuterol [facility] HFA) 45 MCG/ACT inhaler Inhale 1-2 puffs into the lungs every 4 (four) hours as needed for wheezing. 04/23/22   Pincus Sanes, MD  METAMUCIL FIBER PO Take by mouth in the morning and at bedtime. Patient not taking: Reported on 08/13/2022    [provider]  midodrine (PROAMATINE) 5 MG tablet Take 1 tablet (5 mg total) by mouth 2 (two) times daily with a meal. 06/05/22   Burns, Bobette Mo, MD  pantoprazole (PROTONIX) 40 MG tablet Take 1 tablet (40 mg total) by mouth daily. 05/12/22   Pincus Sanes, MD  Probiotic Product (ALIGN) 4 MG CAPS Take 1  capsule by mouth as needed.    [provider]  ranolazine (RANEXA) 1000 MG SR tablet Take 1 tablet (1,000 mg total) by mouth 2 (two) times daily. Please take 0.5 tablet (500 mg) twice a day times one week and then increase to 1 tablet (1,000 mg) twice daily. Patient taking differently: Take 500 mg by mouth 2 (two) times daily. Please take 0.5 tablet (500 mg) twice a day times one week and then increase to 1 tablet (1,000 mg) twice daily. 07/21/22   Antonieta Iba, MD  RESTASIS 0.05 % ophthalmic emulsion 1 drop 2 (two) times daily. 02/15/21   [provider]  rosuvastatin (CRESTOR) 5 MG tablet Take 1 tablet (5 mg total) by mouth daily. For cholesterol 05/12/22   Pincus Sanes, MD  traZODone (DESYREL) 50 MG tablet Take 50 mg by mouth at bedtime.    [provider]  vitamin C (ASCORBIC ACID) 500 MG tablet Take 500 mg by mouth daily.    [provider]  Hyoscyamine-Phenyltoloxamine [facility] NF) [id number] MG CAPS Take one tablet by mouth as needed for abdominal cramping 04/15/11 05/20/11  Mardella Layman, MD    Allergies  Allergen Reactions   Tape Rash   Albuterol     heartburn   Cymbalta [Duloxetine Hcl]     Diarrhea, nausea, anxiety worse, shaky   Latex Other (See Comments)   Lyrica [Pregabalin] Other (See Comments)  Sedation   Neurontin [Gabapentin] Other (See Comments)    Sedation   Nitrofuran Derivatives Other (See Comments)    "tingling"   Paxil [Paroxetine Hcl]     Bowel upset, tingling   Prozac [Fluoxetine Hcl] Other (See Comments)    Made patient worse and constipation    Patient Active Problem List   Diagnosis Date Noted   Chronic fatigue 06/04/2022   Aortic atherosclerosis (HCC) 05/12/2022   Renal cyst 05/01/2022   Superior mesenteric artery stenosis (HCC) 05/01/2022   Right-sided chest pain 04/23/2022   Mid back pain 04/23/2022   Major depressive disorder, recurrent episode, moderate (HCC) 06/17/2021   Easy bruising 10/15/2020    S/P ORIF (open reduction internal fixation) fracture, left hip 09/16/2019   Hair loss 07/21/2019   Grief 03/25/2019   DDD (degenerative disc disease), cervical 09/24/2018   Conductive hearing loss, bilateral 03/10/2018   Prediabetes 12/17/2017   Dizziness 02/26/2017   B12 deficiency 12/16/2016   Asthma 04/22/2016   Family history of diabetes mellitus (DM) 12/07/2015   Dyssynergic defecation    Obstructive sleep apnea 07/12/2015   Seasonal and perennial allergic rhinitis 07/12/2015   Vitamin D deficiency 12/05/2014   Rhinitis, chronic 05/30/2014    Class: Chronic   Vaginal atrophy 10/20/2012   Insomnia 10/20/2012   Palpitations 12/31/2010   GERD (gastroesophageal reflux disease) 07/25/2010   Lymphocytic colitis 06/14/2010   Depression    Osteoporosis 12/27/2009   Hereditary and idiopathic peripheral neuropathy 12/22/2007   Hyperlipidemia 03/09/2007   Generalized anxiety disorder 03/09/2007   MIGRAINE HEADACHE 03/09/2007   IBS 03/09/2007   SPONDYLOSIS 03/09/2007   SCHATZKI'S RING, HX OF 03/09/2007   Diaphragmatic hernia 12/30/2006   DIVERTICULOSIS OF COLON 01/01/2000    Past Medical History:  Diagnosis Date   Allergy    Anemia    Anxiety    Asthma    Cataract    bil cateracts removed   Complication of anesthesia    Depression    generalized anxiety disorder   Diverticulosis    Duodenal diverticulum    Family history of adverse reaction to anesthesia    Mother and Daughters- N/V   Fibromyalgia    Gastroesophageal reflux disease with hiatal hernia    Hiatal hernia    History of kidney stones    Hyperlipidemia    IBS (irritable bowel syndrome)    Lymphocytic colitis    Dr Jarold Motto   Migraine headache    Mitral valve prolapse    Neuropathy    Osteopenia    BMD ordered by GYN   Osteoporosis    Peripheral neuropathy    treated as RLS by  Neurology   PONV (postoperative nausea and vomiting)    Restless leg syndrome    Schatzki's ring    History of   Sleep  apnea    On CPAP, has not been using   Spondylosis     Past Surgical History:  Procedure Laterality Date   ANAL RECTAL MANOMETRY N/A 11/14/2015   Procedure: ANO RECTAL MANOMETRY;  Surgeon: Iva Boop, MD;  Location: [facility];  Service: Endoscopy;  Laterality: N/A;   BREAST ENHANCEMENT SURGERY     CATARACT EXTRACTION Bilateral    cataract surgery Left    Dr Hazle Quant   CHOLECYSTECTOMY     COLONOSCOPY     ESOPHAGEAL DILATION     X 2   ROTATOR CUFF REPAIR Left    SINUS ENDO W/FUSION N/A 05/30/2014   Procedure: REVISION  FRONTAL SINUS SURGERY  WITH FUSION SCAN;  Surgeon: Osborn Coho, MD;  Location: [facility] OR;  Service: ENT;  Laterality: N/A;   SINUS SURGERY WITH INSTATRAK     x 2   TUBAL LIGATION     UPPER GASTROINTESTINAL ENDOSCOPY     Vaginal cystectomy     x 2   VAGINAL HYSTERECTOMY  05/2007   Vaginal repair, Dr Nicholas Lose.  Partial  hysterectomy.    Social History   Socioeconomic History   Marital status: Married    Spouse name: Not on file   Number of children: 2   Years of education: 18   Highest education level: Not on file  Occupational History   Occupation: retired    Associate Professor: [employer]  Tobacco Use   Smoking status: Former    Current packs/day: 0.00    Average packs/day: 0.5 packs/day for 15.0 years (7.5 ttl pk-yrs)    Types: Cigarettes    Start date: 03/04/1983    Quit date: 03/03/1998    Years since quitting: 24.5   Smokeless tobacco: Never   Tobacco comments:    smoked 1973- 2006, up to <  1  ppd  Vaping Use   Vaping status: Never Used  Substance and Sexual Activity   Alcohol use: No    Alcohol/week: 0.0 standard drinks of alcohol   Drug use: No   Sexual activity: Yes    Partners: Male    Comment: 1st intercourse- 50, partners- 2, married- 22 yrs   Other Topics Concern   Not on file  Social History Narrative   HAS REGULAR EXERCISE   DAILY CAFFEINE: 1-2 CUPS   Patient is right handed.   Social Determinants of Health   Financial Resource Strain: Not  on file  Food Insecurity: Not on file  Transportation Needs: Not on file  Physical Activity: Not on file  Stress: Not on file  Social Connections: Not on file  Intimate Partner Violence: Not on file    Family History  Problem Relation Age of Onset   Hypertension Father 56   Stroke Father 64   Heart attack Father         ? in 36s   Hypertension Mother    Neuropathy Mother    Anemia Mother    Coronary artery disease Brother        Stent placement in 34s   Diabetes Maternal Uncle    Heart attack Paternal Uncle        SEVEN , ? age   Colon cancer Neg Hx    Seizures Neg Hx    Esophageal cancer Neg Hx    Rectal cancer Neg Hx    Stomach cancer Neg Hx      Review of Systems  Constitutional: Negative.  Negative for fever.  HENT: Negative.  Negative for congestion and sore throat.   Respiratory:  Positive for cough, sputum production and wheezing. Negative for hemoptysis and shortness of breath.   Cardiovascular: Negative.  Negative for chest pain and palpitations.  Gastrointestinal:  Negative for abdominal pain, diarrhea, nausea and vomiting.  Genitourinary: Negative.  Negative for dysuria and hematuria.  Skin: Negative.  Negative for rash.  Neurological: Negative.  Negative for dizziness and headaches.  All other systems reviewed and are negative.   Today's Vitals   09/23/22 0824  BP: 118/66  Pulse: 63  Temp: 98.1 F (36.7 C)  TempSrc: Oral  SpO2: 97%  Weight: 130 lb 8 oz (59.2 kg)  Height: 5\' 8"  (1.727 m)  Body mass index is 19.84 kg/m.   Physical Exam Vitals reviewed.  Constitutional:      Appearance: Normal appearance.  HENT:     Head: Normocephalic.     Mouth/Throat:     Mouth: Mucous membranes are moist.     Pharynx: Oropharynx is clear.  Eyes:     Extraocular Movements: Extraocular movements intact.     Conjunctiva/sclera: Conjunctivae normal.     Pupils: Pupils are equal, round, and reactive to light.  Cardiovascular:     Rate and Rhythm: Normal  rate and regular rhythm.     Pulses: Normal pulses.     Heart sounds: Normal heart sounds.  Pulmonary:     Effort: Pulmonary effort is normal.     Breath sounds: Normal breath sounds.  Musculoskeletal:     Cervical back: No tenderness.     Right lower leg: No edema.     Left lower leg: No edema.  Lymphadenopathy:     Cervical: No cervical adenopathy.  Skin:    General: Skin is warm and dry.  Neurological:     General: No focal deficit present.     Mental Status: She is alert and oriented to person, place, and time.  Psychiatric:        Mood and Affect: Mood normal.        Behavior: Behavior normal.    DG Chest 2 View  Result Date: 09/23/2022 CLINICAL DATA:  Cough EXAM: CHEST - 2 VIEW COMPARISON:  Previous studies including the examination of 04/30/2022 FINDINGS: Transverse diameter of heart is slightly increased. Low position of diaphragms may suggest air trapping or COPD. There are no signs of pulmonary edema or focal pulmonary consolidation. Small linear densities are seen in right upper and left lower lung fields, possibly suggesting minimal scarring. There is no pleural effusion or pneumothorax. IMPRESSION: There are no signs of pulmonary edema or focal pulmonary consolidation. Electronically Signed   By: Ernie Avena M.D.   On: 09/23/2022 09:04     ASSESSMENT & PLAN: A total of 42 minutes was spent with the patient and counseling/coordination of care regarding preparing for this visit, review of most recent office visit notes, review of chronic medical conditions under management, review of all medications, review of today's x-ray report, presence of lower respiratory infection and need for antibiotics, COPD diagnosis, prognosis, documentation and need for follow-up.  Problem List Items Addressed This Visit       Respiratory   Asthma    With COPD component Chest x-ray with findings of COPD Recommend to change maintenance inhaler to Breztri 2 puffs twice a  day Continue Xopenex as rescue inhaler      Relevant Medications   Budeson-Glycopyrrol-Formoterol (BREZTRI AEROSPHERE) 160-9-4.8 MCG/ACT AERO   Lower respiratory infection - Primary    Clinically stable.  No signs of pneumonia. Persistent cough with changes in sputum color and consistency Most likely secondary bacterial infection No pneumonia on x-ray Recommend daily azithromycin for 5 days      Relevant Medications   azithromycin (ZITHROMAX) 250 MG tablet   Other Relevant Orders   DG Chest 2 View (Completed)     Other   Persistent cough for 3 weeks or longer    Cough management discussed Recommend over-the-counter Mucinex DM Start Tessalon 200 mg 3 times daily as needed      Relevant Medications   benzonatate (TESSALON) 200 MG capsule   Other Relevant Orders   DG Chest 2 View (Completed)   Patient Instructions  Chronic Cough Coughing is a reflex that clears your throat and airways (respiratory system). It helps heal and protect your lungs. It is normal to cough from time to time. A cough that happens with other symptoms or that lasts a long time may be a sign of a condition that needs treatment. A long-term (chronic) cough may last 8 or more weeks. There are two types of chronic cough: A symptomatic chronic cough. This is caused by a disease that can be found and treated. A refractory chronic cough. This is a cough that does not go away with testing and treatment. A chronic cough may be caused by: Long-term lung diseases. These include chronic obstructive pulmonary disease (COPD), asthma, and pulmonary fibrosis. Upper airway problems. These include allergies, sinusitis, and gastric reflux. Some medicines. Smoking. Follow these instructions at home: Medicines Take over-the-counter and prescription medicines only as told by your health care provider. Ask your provider about getting a flu (influenza) or pneumonia vaccine. Managing a sore or dry throat If your throat is  sore or dry, gargle with a mixture of salt and water 3-4 times a day or as needed. To make salt water, completely dissolve -1 tsp (3-6 g) of salt in 1 cup (237 mL) of warm water. Soothe your throat with a cough drop or honey. A dry throat may make your cough worse. Use a cool mist vaporizer at home to add moisture to the air. Lifestyle Avoid cigarette smoke. Do not use any products that contain nicotine or tobacco. These products include cigarettes, chewing tobacco, and vaping devices, such as e-cigarettes. If you need help quitting, ask your provider. Avoid things that may irritate your throat or trigger your allergies. General instructions  Drink enough fluid to keep your pee (urine) pale yellow. Always cover your mouth when you cough. Stay away from people who are sick. Getting a cold or the flu can make your cough worse. Wash your hands often with soap and water for at least 20 seconds. If soap and water are not available, use hand sanitizer. Contact a health care provider if: Your cough gets worse. You have a fever or chills. You are short of breath. Get help right away if: You have trouble breathing. You have chest pain. These symptoms may be an emergency. Get help right away. Call 911. Do not wait to see if the symptoms will go away. Do not drive yourself to the hospital. This information is not intended to replace advice given to you by your health care provider. Make sure you discuss any questions you have with your health care provider. Document Revised: 03/13/2022 Document Reviewed: 10/31/2021 Elsevier Patient Education  2024 Elsevier Inc.    Edwina Barth, MD [facility]

## 2022-09-23 NOTE — Patient Instructions (Signed)
Chronic Cough Coughing is a reflex that clears your throat and airways (respiratory system). It helps heal and protect your lungs. It is normal to cough from time to time. A cough that happens with other symptoms or that lasts a long time may be a sign of a condition that needs treatment. A long-term (chronic) cough may last 8 or more weeks. There are two types of chronic cough: A symptomatic chronic cough. This is caused by a disease that can be found and treated. A refractory chronic cough. This is a cough that does not go away with testing and treatment. A chronic cough may be caused by: Long-term lung diseases. These include chronic obstructive pulmonary disease (COPD), asthma, and pulmonary fibrosis. Upper airway problems. These include allergies, sinusitis, and gastric reflux. Some medicines. Smoking. Follow these instructions at home: Medicines Take over-the-counter and prescription medicines only as told by your health care provider. Ask your provider about getting a flu (influenza) or pneumonia vaccine. Managing a sore or dry throat If your throat is sore or dry, gargle with a mixture of salt and water 3-4 times a day or as needed. To make salt water, completely dissolve -1 tsp (3-6 g) of salt in 1 cup (237 mL) of warm water. Soothe your throat with a cough drop or honey. A dry throat may make your cough worse. Use a cool mist vaporizer at home to add moisture to the air. Lifestyle Avoid cigarette smoke. Do not use any products that contain nicotine or tobacco. These products include cigarettes, chewing tobacco, and vaping devices, such as e-cigarettes. If you need help quitting, ask your provider. Avoid things that may irritate your throat or trigger your allergies. General instructions  Drink enough fluid to keep your pee (urine) pale yellow. Always cover your mouth when you cough. Stay away from people who are sick. Getting a cold or the flu can make your cough worse. Wash your  hands often with soap and water for at least 20 seconds. If soap and water are not available, use hand sanitizer. Contact a health care provider if: Your cough gets worse. You have a fever or chills. You are short of breath. Get help right away if: You have trouble breathing. You have chest pain. These symptoms may be an emergency. Get help right away. Call 911. Do not wait to see if the symptoms will go away. Do not drive yourself to the hospital. This information is not intended to replace advice given to you by your health care provider. Make sure you discuss any questions you have with your health care provider. Document Revised: 03/13/2022 Document Reviewed: 10/31/2021 Elsevier Patient Education  2024 [employer].

## 2022-09-23 NOTE — Assessment & Plan Note (Signed)
With COPD component Chest x-ray with findings of COPD Recommend to change maintenance inhaler to Breztri 2 puffs twice a day Continue Xopenex as rescue inhaler

## 2022-09-23 NOTE — Assessment & Plan Note (Signed)
Cough management discussed Recommend over-the-counter Mucinex DM Start Tessalon 200 mg 3 times daily as needed

## 2022-09-25 ENCOUNTER — Telehealth: Payer: Self-pay | Admitting: Internal Medicine

## 2022-09-25 NOTE — Telephone Encounter (Signed)
Patient had chest x-ray on 09/23/2022 ordered by Dr. Alvy Bimler. She would like a call back to go over the results. Best callback is [phone].

## 2022-09-25 NOTE — Telephone Encounter (Signed)
Called pt inform her MD left result on mycart. Pt states she does not used the mychart. Read [name] notes. Inform pt finish meds as rx and no better after completing for her to f/u w/HeatherBurns  "Hi [name], [employer] you today. Chest x-ray shows findings of COPD. No pneumonia. Take medications as prescribed and follow-up with your PCP as needed. Dr. Alvy Bimler"

## 2022-10-07 NOTE — Progress Notes (Unsigned)
Subjective:    Patient ID: [name], female    DOB: 1945-04-08, 77 y.o.   MRN: [id number]      HPI Ayn is here for No chief complaint on file.   BP  Cough, hoarseness -      Medications and allergies reviewed with patient and updated if appropriate.  Current Outpatient Medications on File Prior to Visit  Medication Sig Dispense Refill   Azelastine-Fluticasone 137-50 MCG/ACT SUSP Place 1 spray into the nose every 12 (twelve) hours. 23 g 8   azithromycin (ZITHROMAX) 250 MG tablet Sig as indicated 6 tablet 0   benzonatate (TESSALON) 200 MG capsule Take 1 capsule (200 mg total) by mouth 2 (two) times daily as needed for cough. 20 capsule 0   Budeson-Glycopyrrol-Formoterol (BREZTRI AEROSPHERE) 160-9-4.8 MCG/ACT AERO Inhale 2 puffs into the lungs 2 (two) times daily. 10.7 g 11   Cyanocobalamin (VITAMIN B-12 PO) Take 1 tablet by mouth daily.     famotidine (PEPCID) 40 MG tablet Take 1 tablet (40 mg total) by mouth daily. For heartburn 90 tablet 1   FLUoxetine (PROZAC) 10 MG capsule Take 10 mg by mouth daily.     levalbuterol (XOPENEX HFA) 45 MCG/ACT inhaler Inhale 1-2 puffs into the lungs every 4 (four) hours as needed for wheezing. 1 each 12   METAMUCIL FIBER PO Take by mouth in the morning and at bedtime.     midodrine (PROAMATINE) 5 MG tablet Take 1 tablet (5 mg total) by mouth 2 (two) times daily with a meal. 60 tablet 2   pantoprazole (PROTONIX) 40 MG tablet Take 1 tablet (40 mg total) by mouth daily. 90 tablet 5   Probiotic Product (ALIGN) 4 MG CAPS Take 1 capsule by mouth as needed.     ranolazine (RANEXA) 1000 MG SR tablet Take 1 tablet (1,000 mg total) by mouth 2 (two) times daily. Please take 0.5 tablet (500 mg) twice a day times one week and then increase to 1 tablet (1,000 mg) twice daily. (Patient taking differently: Take 500 mg by mouth 2 (two) times daily. Please take 0.5 tablet (500 mg) twice a day times one week and then increase to 1 tablet  (1,000 mg) twice daily.) 180 tablet 3   RESTASIS 0.05 % ophthalmic emulsion 1 drop 2 (two) times daily.     rosuvastatin (CRESTOR) 5 MG tablet Take 1 tablet (5 mg total) by mouth daily. For cholesterol 90 tablet 3   traZODone (DESYREL) 50 MG tablet Take 50 mg by mouth at bedtime.     vitamin C (ASCORBIC ACID) 500 MG tablet Take 500 mg by mouth daily.     [DISCONTINUED] Hyoscyamine-Phenyltoloxamine (DIGEX NF) [id number] MG CAPS Take one tablet by mouth as needed for abdominal cramping 24 each 0   No current facility-administered medications on file prior to visit.    Review of Systems     Objective:  There were no vitals filed for this visit. BP Readings from Last 3 Encounters:  09/23/22 118/66  08/13/22 100/60  07/21/22 90/60   Wt Readings from Last 3 Encounters:  09/23/22 130 lb 8 oz (59.2 kg)  08/13/22 132 lb 4 oz (60 kg)  07/21/22 135 lb (61.2 kg)   There is no height or weight on file to calculate BMI.    Physical Exam         Assessment & Plan:    See Problem List for Assessment and Plan of chronic medical problems.

## 2022-10-08 ENCOUNTER — Ambulatory Visit: Payer: 59 | Admitting: Internal Medicine

## 2022-10-08 ENCOUNTER — Encounter: Payer: Self-pay | Admitting: Internal Medicine

## 2022-10-08 VITALS — BP 106/64 | HR 57 | Temp 97.9°F | Ht 68.0 in | Wt 133.0 lb

## 2022-10-08 DIAGNOSIS — K219 Gastro-esophageal reflux disease without esophagitis: Secondary | ICD-10-CM

## 2022-10-08 DIAGNOSIS — I952 Hypotension due to drugs: Secondary | ICD-10-CM | POA: Diagnosis not present

## 2022-10-08 DIAGNOSIS — J441 Chronic obstructive pulmonary disease with (acute) exacerbation: Secondary | ICD-10-CM | POA: Insufficient documentation

## 2022-10-08 DIAGNOSIS — J449 Chronic obstructive pulmonary disease, unspecified: Secondary | ICD-10-CM | POA: Insufficient documentation

## 2022-10-08 DIAGNOSIS — J22 Unspecified acute lower respiratory infection: Secondary | ICD-10-CM

## 2022-10-08 DIAGNOSIS — J45901 Unspecified asthma with (acute) exacerbation: Secondary | ICD-10-CM

## 2022-10-08 MED ORDER — CEFDINIR 300 MG PO CAPS: 300.0000 mg | ORAL_CAPSULE | Freq: Two times a day (BID) | ORAL | 0 refills | Status: AC

## 2022-10-08 MED ORDER — MIDODRINE HCL 10 MG PO TABS: 10.0000 mg | ORAL_TABLET | Freq: Two times a day (BID) | ORAL | 2 refills | Status: DC

## 2022-10-08 NOTE — Assessment & Plan Note (Signed)
Chronic Currently only taking one of her 2 medications and states burning sensation in her chest Advise she needs to be taking both medications Famotidine 40 mg once daily, pantoprazole 40 mg once daily

## 2022-10-08 NOTE — Assessment & Plan Note (Signed)
Subacute Related to lower respiratory tract infection Still with asthma/COPD symptoms although her symptoms are better Not using inhalers daily-start Breztri twice daily every day Use Xopenex inhaler as needed Omnicef 300 mg twice daily x 1 week for residual infection

## 2022-10-08 NOTE — Assessment & Plan Note (Signed)
Chronic Currently taking midodrine 5 mg twice daily BP still low at home and on the low side here-she is symptomatic with lightheadedness and dizziness BP at home 90s/50s Increase midodrine to 10 mg twice daily with meals Monitor BP at home

## 2022-10-08 NOTE — Assessment & Plan Note (Signed)
Subacute Has been sick for weeks she states-seen 7/23 Symptoms improved after Z-Pak Still with dry cough, shortness of breath, tightness which may be related to her asthma/COPD Still with some sinus congestion and cold symptoms Start Omnicef 300 mg twice daily x 1 week Take Breztri inhaler twice daily-use daily.  Rinse mouth afterwards Use Xopenex inhaler as needed Take both medications for GERD

## 2022-10-08 NOTE — Patient Instructions (Addendum)
     Medications changes include :     increase midodrine 10 mg twice a day with meals  Take Breztri twice daily - rinse your mouth after  Take both stomach medications.   Start omnicef 300 mg twice daily for one week    Return if symptoms worsen or fail to improve.

## 2022-10-24 ENCOUNTER — Ambulatory Visit: Payer: 59 | Admitting: Internal Medicine

## 2022-10-28 ENCOUNTER — Encounter: Payer: Self-pay | Admitting: Internal Medicine

## 2022-11-10 ENCOUNTER — Encounter: Payer: Self-pay | Admitting: Internal Medicine

## 2022-11-10 NOTE — Progress Notes (Deleted)
Subjective:    Patient ID: [name], female    DOB: 03/28/1945, 77 y.o.   MRN: [id number]     HPI [name] is here for follow up of her chronic medical problems - osteoporosis.  Previous treatments: Fosamax 35 years ago for 5-10 years Taking calcium and vitamin d  + family history    Medications and allergies reviewed with patient and updated if appropriate.  Current Outpatient Medications on File Prior to Visit  Medication Sig Dispense Refill   Azelastine-Fluticasone 137-50 MCG/ACT SUSP Place 1 spray into the nose every 12 (twelve) hours. 23 g 8   Budeson-Glycopyrrol-Formoterol (BREZTRI AEROSPHERE) 160-9-4.8 MCG/ACT AERO Inhale 2 puffs into the lungs 2 (two) times daily. 10.7 g 11   Cyanocobalamin (VITAMIN B-12 PO) Take 1 tablet by mouth daily.     famotidine (PEPCID) 40 MG tablet Take 1 tablet (40 mg total) by mouth daily. For heartburn 90 tablet 1   FLUoxetine (PROZAC) 10 MG capsule Take 10 mg by mouth daily.     levalbuterol (XOPENEX HFA) 45 MCG/ACT inhaler Inhale 1-2 puffs into the lungs every 4 (four) hours as needed for wheezing. 1 each 12   METAMUCIL FIBER PO Take by mouth in the morning and at bedtime.     midodrine (PROAMATINE) 10 MG tablet Take 1 tablet (10 mg total) by mouth 2 (two) times daily. 60 tablet 2   pantoprazole (PROTONIX) 40 MG tablet Take 1 tablet (40 mg total) by mouth daily. 90 tablet 5   Probiotic Product (ALIGN) 4 MG CAPS Take 1 capsule by mouth as needed.     ranolazine (RANEXA) 1000 MG SR tablet Take 1 tablet (1,000 mg total) by mouth 2 (two) times daily. Please take 0.5 tablet (500 mg) twice a day times one week and then increase to 1 tablet (1,000 mg) twice daily. (Patient taking differently: Take 500 mg by mouth 2 (two) times daily. Please take 0.5 tablet (500 mg) twice a day times one week and then increase to 1 tablet (1,000 mg) twice daily.) 180 tablet 3   RESTASIS 0.05 % ophthalmic emulsion 1 drop 2 (two) times daily.      rosuvastatin (CRESTOR) 5 MG tablet Take 1 tablet (5 mg total) by mouth daily. For cholesterol 90 tablet 3   traZODone (DESYREL) 50 MG tablet Take 50 mg by mouth at bedtime.     vitamin C (ASCORBIC ACID) 500 MG tablet Take 500 mg by mouth daily.     [DISCONTINUED] Hyoscyamine-Phenyltoloxamine (DIGEX NF) [id number] MG CAPS Take one tablet by mouth as needed for abdominal cramping 24 each 0   No current facility-administered medications on file prior to visit.     Review of Systems     Objective:  There were no vitals filed for this visit. BP Readings from Last 3 Encounters:  10/08/22 106/64  09/23/22 118/66  08/13/22 100/60   Wt Readings from Last 3 Encounters:  10/08/22 133 lb (60.3 kg)  09/23/22 130 lb 8 oz (59.2 kg)  08/13/22 132 lb 4 oz (60 kg)   There is no height or weight on file to calculate BMI.    Physical Exam     Lab Results  Component Value Date   WBC 8.1 08/13/2022   HGB 12.6 08/13/2022   HCT 38.4 08/13/2022   PLT 266.0 08/13/2022   GLUCOSE 94 08/13/2022   CHOL 131 06/12/2022   TRIG 76.0 06/12/2022   HDL 56.80 06/12/2022   LDLCALC 59 06/12/2022  ALT 14 08/13/2022   AST 16 08/13/2022   NA 135 08/13/2022   K 4.3 08/13/2022   CL 100 08/13/2022   CREATININE 0.65 08/13/2022   BUN 15 08/13/2022   CO2 28 08/13/2022   TSH 2.69 06/12/2022   INR 1.08 01/09/2009   HGBA1C 6.0 06/12/2022   MICROALBUR 2.5 (H) 01/06/2011     Assessment & Plan:    See Problem List for Assessment and Plan of chronic medical problems.

## 2022-11-11 ENCOUNTER — Telehealth: Payer: Self-pay | Admitting: Diagnostic Neuroimaging

## 2022-11-11 ENCOUNTER — Ambulatory Visit: Payer: 59 | Admitting: Internal Medicine

## 2022-11-11 DIAGNOSIS — I952 Hypotension due to drugs: Secondary | ICD-10-CM

## 2022-11-11 DIAGNOSIS — M810 Age-related osteoporosis without current pathological fracture: Secondary | ICD-10-CM

## 2022-11-11 NOTE — Telephone Encounter (Signed)
Pt called to schedule an appt due to neuropathy. Scheduled with Dr. Marjory Lies on 05/25/22 at 4:00 pm. Pt would like a call if have an sooner appt.

## 2022-11-11 NOTE — Progress Notes (Unsigned)
Subjective:    Patient ID: [name], female    DOB: [birth year], 77 y.o.   MRN: [id number]     HPI [name] is here for follow up of her chronic medical problems - osteoporosis.  Previous treatments: Fosamax 35 years ago for 5-10 years Taking calcium and vitamin d  + family history    Medications and allergies reviewed with patient and updated if appropriate.  Current Outpatient Medications on File Prior to Visit  Medication Sig Dispense Refill   Azelastine-Fluticasone 137-50 MCG/ACT SUSP Place 1 spray into the nose every 12 (twelve) hours. 23 g 8   Budeson-Glycopyrrol-Formoterol (BREZTRI AEROSPHERE) 160-9-4.8 MCG/ACT AERO Inhale 2 puffs into the lungs 2 (two) times daily. 10.7 g 11   Cyanocobalamin (VITAMIN B-12 PO) Take 1 tablet by mouth daily.     famotidine (PEPCID) 40 MG tablet Take 1 tablet (40 mg total) by mouth daily. For heartburn 90 tablet 1   FLUoxetine (PROZAC) 10 MG capsule Take 10 mg by mouth daily.     levalbuterol (XOPENEX HFA) 45 MCG/ACT inhaler Inhale 1-2 puffs into the lungs every 4 (four) hours as needed for wheezing. 1 each 12   METAMUCIL FIBER PO Take by mouth in the morning and at bedtime.     midodrine (PROAMATINE) 10 MG tablet Take 1 tablet (10 mg total) by mouth 2 (two) times daily. 60 tablet 2   pantoprazole (PROTONIX) 40 MG tablet Take 1 tablet (40 mg total) by mouth daily. 90 tablet 5   Probiotic Product (ALIGN) 4 MG CAPS Take 1 capsule by mouth as needed.     ranolazine (RANEXA) 1000 MG SR tablet Take 1 tablet (1,000 mg total) by mouth 2 (two) times daily. Please take 0.5 tablet (500 mg) twice a day times one week and then increase to 1 tablet (1,000 mg) twice daily. (Patient taking differently: Take 500 mg by mouth 2 (two) times daily. Please take 0.5 tablet (500 mg) twice a day times one week and then increase to 1 tablet (1,000 mg) twice daily.) 180 tablet 3   RESTASIS 0.05 % ophthalmic emulsion 1 drop 2 (two) times daily.      rosuvastatin (CRESTOR) 5 MG tablet Take 1 tablet (5 mg total) by mouth daily. For cholesterol 90 tablet 3   traZODone (DESYREL) 50 MG tablet Take 50 mg by mouth at bedtime.     vitamin C (ASCORBIC ACID) 500 MG tablet Take 500 mg by mouth daily.     [DISCONTINUED] Hyoscyamine-Phenyltoloxamine (DIGEX NF) [id number] MG CAPS Take one tablet by mouth as needed for abdominal cramping 24 each 0   No current facility-administered medications on file prior to visit.     Review of Systems     Objective:  There were no vitals filed for this visit. BP Readings from Last 3 Encounters:  10/08/22 106/64  09/23/22 118/66  08/13/22 100/60   Wt Readings from Last 3 Encounters:  10/08/22 133 lb (60.3 kg)  09/23/22 130 lb 8 oz (59.2 kg)  08/13/22 132 lb 4 oz (60 kg)   There is no height or weight on file to calculate BMI.    Physical Exam     Lab Results  Component Value Date   WBC 8.1 08/13/2022   HGB 12.6 08/13/2022   HCT 38.4 08/13/2022   PLT 266.0 08/13/2022   GLUCOSE 94 08/13/2022   CHOL 131 06/12/2022   TRIG 76.0 06/12/2022   HDL 56.80 06/12/2022   LDLCALC 59 06/12/2022  ALT 14 08/13/2022   AST 16 08/13/2022   NA 135 08/13/2022   K 4.3 08/13/2022   CL 100 08/13/2022   CREATININE 0.65 08/13/2022   BUN 15 08/13/2022   CO2 28 08/13/2022   TSH 2.69 06/12/2022   INR 1.08 01/09/2009   HGBA1C 6.0 06/12/2022   MICROALBUR 2.5 (H) 01/06/2011     Assessment & Plan:    See Problem List for Assessment and Plan of chronic medical problems.

## 2022-11-12 ENCOUNTER — Encounter: Payer: Self-pay | Admitting: Internal Medicine

## 2022-11-12 ENCOUNTER — Ambulatory Visit (INDEPENDENT_AMBULATORY_CARE_PROVIDER_SITE_OTHER): Payer: 59 | Admitting: Internal Medicine

## 2022-11-12 VITALS — BP 160/92 | HR 66 | Temp 98.1°F | Ht 68.0 in | Wt 130.4 lb

## 2022-11-12 DIAGNOSIS — G901 Familial dysautonomia [Riley-Day]: Secondary | ICD-10-CM | POA: Diagnosis not present

## 2022-11-12 DIAGNOSIS — M810 Age-related osteoporosis without current pathological fracture: Secondary | ICD-10-CM | POA: Diagnosis not present

## 2022-11-12 MED ORDER — MIDODRINE HCL 10 MG PO TABS: 10.0000 mg | ORAL_TABLET | Freq: Three times a day (TID) | ORAL | 5 refills | Status: DC

## 2022-11-12 NOTE — Patient Instructions (Addendum)
Follow up with Dr Elvera Lennox regarding your osteoporosis --  [facility] phone number [phone].    Try wear compression socks daily  - try to get medium compression knee high socks.   You can get this at a medical supply store.    Drink at much water as possible  Do not limit your salt - ok to use a little extra the salt.     Call [phone] to schedule a complimentary 15-minute phone consultation with our lead doctor, Dr. Theador Hawthorne to discuss the possibility of you having POTs.    Medications changes include :   increase midodrine to three times a day    Postural Orthostatic Tachycardia Syndrome Postural orthostatic tachycardia syndrome (POTS) is a group of symptoms that occur along with an increase in heart rate when a person stands up after lying down. The symptoms include light-headedness or fainting, and they improve when the person lies back down. POTS may be associated with another medical condition, or it may occur on its own. What are the causes? The cause of this condition is not known, but many conditions and diseases are associated with it. What increases the risk? This condition is more likely to develop in: Women 79-46 years old. Women who are pregnant. Women who are in their period (menstruating). People who have certain conditions, such as: Infection from a virus. Diseases that cause the body's defense system (immune system) to attack healthy organs. These are called autoimmune diseases. Losing a lot of red blood cells (anemia). Losing too much water in the body (dehydration). An overactive thyroid (hyperthyroidism). People who take certain medicines. People who have had a major injury. People who have had surgery. What are the signs or symptoms? The most common symptom of this condition is light-headedness when you stand up from a lying or sitting position. Other symptoms may include: Feeling a rapid increase in the heartbeat (tachycardia) within  10 minutes of standing up. Chest pain. Shortness of breath. Breathing that is deeper and faster than normal (hyperventilation). Fainting. Confusion. Trembling. Weakness. Headache. Anxiety. Nausea. Sweating or flushing. Symptoms may be worse in the morning, and they may be relieved by lying down. How is this diagnosed? This condition is diagnosed based on: Your symptoms. Your medical history. A physical exam. Checking your heart rate when you are lying down and after you stand up. Checking your blood pressure when you go from lying down to standing up. Blood and urine tests to measure hormones that change with blood pressure. The blood tests will be done when you are lying down and when you are standing up. You may have other tests to check for conditions or diseases that are associated with POTS. How is this treated? Treatment for this condition depends on how severe your symptoms are and whether you have any conditions or diseases that are associated with POTS. Treatment may involve: Treating any conditions or diseases that are associated with POTS. Drinking two glasses of water before getting up from a lying position. Increasing salt (sodium) in your diet. Taking medicine to control blood pressure and heart rate (beta-blocker). Avoiding certain medicines. Starting an exercise program under the supervision of a health care provider. Follow these instructions at home: Medicines Take over-the-counter and prescription medicines only as told by your health care provider. Let your health care provider know about all prescription or over-the-counter medicines you take. These include herbs, vitamins, and supplements. You may need to stop or adjust some medicines if they cause this condition.  Talk with your health care provider before starting any new medicines. Eating and drinking  Drink enough fluid to keep your urine pale yellow. If told by your health care provider, drink two  glasses of water before getting up from a lying position. Follow instructions from your health care provider about how much sodium you should include in your diet. Avoid heavy meals. Eat several small meals a day instead of a few large meals. General instructions Do an aerobic exercise for 20 minutes a day, at least 3 days a week. Aerobic exercises are those that cause your heart to beat faster. Ask your health care provider what kinds of exercise are safe for you. Do not use any products that contain nicotine or tobacco. These products include cigarettes, chewing tobacco, and vaping devices, such as e-cigarettes. These can interfere with blood flow. If you need help quitting, ask your health care provider. Keep all follow-up visits. This is important. Contact a health care provider if: Your symptoms do not improve after treatment. Your symptoms get worse. You develop new symptoms. Get help right away if: You have chest pain. You have difficulty breathing. You have fainting episodes. These symptoms may be an emergency. Get help right away. Call 911. Do not wait to see if the symptoms will go away. Do not drive yourself to the hospital. Summary POTS is a group of symptoms that occur along with an increase in heart rate when a person stands up after lying down. The most common symptom is light-headedness when you stand up. Treatment for this condition includes treating any underlying conditions, drinking plenty of water, stopping or changing some medicines, or starting an exercise program. Get help right away if you have chest pain, difficulty breathing, or fainting episodes. These symptoms may be an emergency. This information is not intended to replace advice given to you by your health care provider. Make sure you discuss any questions you have with your health care provider. Document Revised: 08/30/2020 Document Reviewed: 08/30/2020 [employer] Patient Education  2024 [employer]      No follow-ups on file.

## 2022-11-12 NOTE — Telephone Encounter (Signed)
Pt's appt added to wait list.

## 2022-11-12 NOTE — Assessment & Plan Note (Signed)
Chronic BP low at home typically high 90's - low 100's On midodrine 10 mg bid now Today she had one of her "episodes" - BP was elevated, palpitations, lightheaded, nausea, feels hot -- BP was high for her ? POTs Hold midodrine until BP is low again then start 10 mg TID Advised her to schedule a visit with Dr Theador Hawthorne  Advised increasing water intake Ok to be liberal with salt Start wearing compression socks daily

## 2022-11-12 NOTE — Assessment & Plan Note (Signed)
Chronic Was on fosamax - she is concerned it caused TMJ degeneration Not exercising Discussed importance of starting medication Would like to see Dr Elvera Lennox again to discuss prolia

## 2022-11-13 ENCOUNTER — Emergency Department (HOSPITAL_COMMUNITY)
Admission: EM | Admit: 2022-11-13 | Discharge: 2022-11-13 | Disposition: A | Discharge status: Home / Self Care | Payer: 59 | Attending: Emergency Medicine | Admitting: Emergency Medicine

## 2022-11-13 ENCOUNTER — Emergency Department (HOSPITAL_COMMUNITY): Payer: 59

## 2022-11-13 ENCOUNTER — Other Ambulatory Visit: Payer: Self-pay

## 2022-11-13 ENCOUNTER — Encounter (HOSPITAL_COMMUNITY): Payer: Self-pay

## 2022-11-13 ENCOUNTER — Telehealth: Payer: Self-pay | Admitting: Internal Medicine

## 2022-11-13 DIAGNOSIS — R5383 Other fatigue: Secondary | ICD-10-CM | POA: Insufficient documentation

## 2022-11-13 DIAGNOSIS — E871 Hypo-osmolality and hyponatremia: Secondary | ICD-10-CM | POA: Insufficient documentation

## 2022-11-13 DIAGNOSIS — R112 Nausea with vomiting, unspecified: Secondary | ICD-10-CM | POA: Diagnosis not present

## 2022-11-13 DIAGNOSIS — R079 Chest pain, unspecified: Secondary | ICD-10-CM | POA: Diagnosis present

## 2022-11-13 DIAGNOSIS — Z91040 Latex allergy status: Secondary | ICD-10-CM | POA: Diagnosis not present

## 2022-11-13 DIAGNOSIS — R531 Weakness: Secondary | ICD-10-CM | POA: Diagnosis not present

## 2022-11-13 LAB — COMPREHENSIVE METABOLIC PANEL
ALT: 17 U/L (ref 0–44)
AST: 24 U/L (ref 15–41)
Albumin: 3.3 g/dL — ABNORMAL LOW (ref 3.5–5.0)
Alkaline Phosphatase: 38 U/L (ref 38–126)
Anion gap: 11 (ref 5–15)
BUN: 6 mg/dL — ABNORMAL LOW (ref 8–23)
CO2: 19 mmol/L — ABNORMAL LOW (ref 22–32)
Calcium: 8 mg/dL — ABNORMAL LOW (ref 8.9–10.3)
Chloride: 95 mmol/L — ABNORMAL LOW (ref 98–111)
Creatinine, Ser: 0.47 mg/dL (ref 0.44–1.00)
GFR, Estimated: >60 mL/min (ref >60)
Glucose, Bld: 99 mg/dL (ref 70–99)
Potassium: 3.5 mmol/L (ref 3.5–5.1)
Sodium: 125 mmol/L — ABNORMAL LOW (ref 135–145)
Total Bilirubin: 0.5 mg/dL (ref 0.3–1.2)
Total Protein: 5.5 g/dL — ABNORMAL LOW (ref 6.5–8.1)

## 2022-11-13 LAB — CBC
HCT: 35.7 % — ABNORMAL LOW (ref 36.0–46.0)
Hemoglobin: 12.2 g/dL (ref 12.0–15.0)
MCH: 30.2 pg (ref 26.0–34.0)
MCHC: 34.2 g/dL (ref 30.0–36.0)
MCV: 88.4 fL (ref 80.0–100.0)
Platelets: 246 10*3/uL (ref 150–400)
RBC: 4.04 MIL/uL (ref 3.87–5.11)
RDW: 12.9 % (ref 11.5–15.5)
WBC: 6.4 10*3/uL (ref 4.0–10.5)
nRBC: 0 % (ref 0.0–0.2)

## 2022-11-13 LAB — URINALYSIS, ROUTINE W REFLEX MICROSCOPIC
Bilirubin Urine: NEGATIVE
Glucose, UA: NEGATIVE mg/dL
Hgb urine dipstick: NEGATIVE
Ketones, ur: 80 mg/dL — AB
Leukocytes,Ua: NEGATIVE
Nitrite: NEGATIVE
Protein, ur: NEGATIVE mg/dL
Specific Gravity, Urine: 1.015 (ref 1.005–1.030)
pH: 8 (ref 5.0–8.0)

## 2022-11-13 LAB — LIPASE, BLOOD: Lipase: 27 U/L (ref 11–51)

## 2022-11-13 LAB — TROPONIN I (HIGH SENSITIVITY)
Troponin I (High Sensitivity): 5 ng/L (ref <18)
Troponin I (High Sensitivity): 5 ng/L (ref <18)

## 2022-11-13 MED ORDER — ONDANSETRON HCL 4 MG/2ML IJ SOLN
4.0000 mg | Freq: Once | INTRAMUSCULAR | Status: AC
  Administered 2022-11-13: 4 mg via INTRAVENOUS
  Filled 2022-11-13: qty 2

## 2022-11-13 MED ORDER — SODIUM CHLORIDE 0.9 % IV BOLUS
1000.0000 mL | Freq: Once | INTRAVENOUS | Status: AC
  Administered 2022-11-13: 1000 mL via INTRAVENOUS

## 2022-11-13 MED ORDER — IOHEXOL 350 MG/ML SOLN
75.0000 mL | Freq: Once | INTRAVENOUS | Status: AC | PRN
  Administered 2022-11-13: 75 mL via INTRAVENOUS

## 2022-11-13 NOTE — Telephone Encounter (Signed)
Pt husband stating pt is feeling bad BP is high and low  with vomiting during the night. Pt husband wants to know if he need to scheduled another appt for pt. Please call husband with update.  Harvie Heck [phone]

## 2022-11-13 NOTE — ED Triage Notes (Signed)
BIBM from home with c/o N/V since this AM and also reports chest pressure as well. Received zofran 4mg  IV, ASA 324mg  PO, and nitroglycerin 0.4mg  SL x1 with no relief. EMS EKG unremarkable.

## 2022-11-13 NOTE — ED Notes (Signed)
Patient transported to CT 

## 2022-11-13 NOTE — ED Provider Notes (Signed)
[facility] Provider Note   CSN: [id number] Arrival date & time: 11/13/22  1027     History  Chief Complaint  Patient presents with   Chest Pain    [name] is a 77 y.o. female with a past history of fibromyalgia and IBS who presents to the ED for chest pain.  She is experienced ongoing GEN malaise, generalized weakness over the last week or so with nausea and vomiting this morning.  She also noted 1 episode of chest pain earlier this morning as well.  No shortness of breath, fevers, chills or recent illness.  Patient tells me she is experienced similar episodes of generalized weakness for months and is currently being evaluated for POTS   Chest Pain Associated symptoms: weakness        Home Medications Prior to Admission medications   Medication Sig Start Date End Date Taking? Authorizing Provider  Azelastine-Fluticasone 137-50 MCG/ACT SUSP Place 1 spray into the nose every 12 (twelve) hours. 05/12/22   Pincus Sanes, MD  Budeson-Glycopyrrol-Formoterol (BREZTRI AEROSPHERE) 160-9-4.8 MCG/ACT AERO Inhale 2 puffs into the lungs 2 (two) times daily. 09/23/22   Georgina Quint, MD  Cyanocobalamin (VITAMIN B-12 PO) Take 1 tablet by mouth daily.    [provider]  famotidine (PEPCID) 40 MG tablet Take 1 tablet (40 mg total) by mouth daily. For heartburn 05/12/22   Pincus Sanes, MD  FLUoxetine (PROZAC) 10 MG capsule Take 10 mg by mouth daily. 07/02/22   [provider]  levalbuterol Pauline Aus HFA) 45 MCG/ACT inhaler Inhale 1-2 puffs into the lungs every 4 (four) hours as needed for wheezing. 04/23/22   Pincus Sanes, MD  METAMUCIL FIBER PO Take by mouth in the morning and at bedtime.    [provider]  midodrine (PROAMATINE) 10 MG tablet Take 1 tablet (10 mg total) by mouth 3 (three) times daily. 11/12/22   Pincus Sanes, MD  pantoprazole (PROTONIX) 40 MG tablet Take 1 tablet (40 mg total) by  mouth daily. 05/12/22   Pincus Sanes, MD  Probiotic Product (ALIGN) 4 MG CAPS Take 1 capsule by mouth as needed.    [provider]  ranolazine (RANEXA) 1000 MG SR tablet Take 1 tablet (1,000 mg total) by mouth 2 (two) times daily. Please take 0.5 tablet (500 mg) twice a day times one week and then increase to 1 tablet (1,000 mg) twice daily. Patient taking differently: Take 500 mg by mouth 2 (two) times daily. Please take 0.5 tablet (500 mg) twice a day times one week and then increase to 1 tablet (1,000 mg) twice daily. 07/21/22   Antonieta Iba, MD  RESTASIS 0.05 % ophthalmic emulsion 1 drop 2 (two) times daily. 02/15/21   [provider]  rosuvastatin (CRESTOR) 5 MG tablet Take 1 tablet (5 mg total) by mouth daily. For cholesterol 05/12/22   Pincus Sanes, MD  traZODone (DESYREL) 50 MG tablet Take 50 mg by mouth at bedtime.    [provider]  vitamin C (ASCORBIC ACID) 500 MG tablet Take 500 mg by mouth daily.    [provider]  Hyoscyamine-Phenyltoloxamine [facility] NF) [phone] MG CAPS Take one tablet by mouth as needed for abdominal cramping 04/15/11 05/20/11  Mardella Layman, MD      Allergies    Tape, Albuterol, Cymbalta [duloxetine hcl], Latex, Lyrica [pregabalin], Neurontin [gabapentin], Nitrofuran derivatives, Paxil [paroxetine hcl], and Prozac [fluoxetine hcl]    Review  of Systems   Review of Systems  Cardiovascular:  Positive for chest pain.  Neurological:  Positive for weakness.  All other systems reviewed and are negative.   Physical Exam Updated Vital Signs BP 116/75   Pulse 64   Temp 97.9 F (36.6 C) (Oral)   Resp 18   Ht 5\' 8"  (1.727 m)   Wt 59 kg   SpO2 100%   BMI 19.77 kg/m  Physical Exam Vitals and nursing note reviewed.  HENT:     Head: Normocephalic and atraumatic.  Eyes:     Pupils: Pupils are equal, round, and reactive to light.  Cardiovascular:     Rate and Rhythm: Normal rate and regular rhythm.  Pulmonary:      Effort: Pulmonary effort is normal.     Breath sounds: Normal breath sounds.  Abdominal:     Palpations: Abdomen is soft.     Tenderness: There is no abdominal tenderness.  Skin:    General: Skin is warm and dry.  Neurological:     General: No focal deficit present.     Mental Status: She is alert and oriented to person, place, and time.     Motor: No weakness.  Psychiatric:        Mood and Affect: Mood normal.     ED Results / Procedures / Treatments   Labs (all labs ordered are listed, but only abnormal results are displayed) Labs Reviewed  CBC - Abnormal; Notable for the following components:      Result Value   HCT 35.7 (*)    All other components within normal limits  URINALYSIS, ROUTINE W REFLEX MICROSCOPIC - Abnormal; Notable for the following components:   APPearance HAZY (*)    Ketones, ur 80 (*)    All other components within normal limits  COMPREHENSIVE METABOLIC PANEL - Abnormal; Notable for the following components:   Sodium 125 (*)    Chloride 95 (*)    CO2 19 (*)    BUN 6 (*)    Calcium 8.0 (*)    Total Protein 5.5 (*)    Albumin 3.3 (*)    All other components within normal limits  LIPASE, BLOOD  TROPONIN I (HIGH SENSITIVITY)  TROPONIN I (HIGH SENSITIVITY)    EKG None  Radiology CT ABDOMEN PELVIS W CONTRAST  Result Date: 11/13/2022 CLINICAL DATA:  Upper abdominal pain associated with nausea and vomiting EXAM: CT ABDOMEN AND PELVIS WITH CONTRAST TECHNIQUE: Multidetector CT imaging of the abdomen and pelvis was performed using the standard protocol following bolus administration of intravenous contrast. RADIATION DOSE REDUCTION: This exam was performed according to the departmental dose-optimization program which includes automated exposure control, adjustment of the mA and/or kV according to patient size and/or use of iterative reconstruction technique. CONTRAST:  75mL OMNIPAQUE IOHEXOL 350 MG/ML SOLN COMPARISON:  MR abdomen dated 06/09/2022, CT  abdomen and pelvis dated 04/30/2022 FINDINGS: Lower chest: No focal consolidation or pulmonary nodule in the lung bases. No pleural effusion or pneumothorax demonstrated. Partially imaged heart size is normal. Trace pericardial effusion. Hepatobiliary: No focal hepatic lesions. No intra or extrahepatic biliary ductal dilation. Cholecystectomy. Pancreas: No focal lesions or main ductal dilation. Spleen: Normal in size without focal abnormality. Adrenals/Urinary Tract: No adrenal nodules. No suspicious renal mass, calculi or hydronephrosis. Bilateral extrarenal pelvis. Unchanged exophytic left upper pole lesion measuring 1.4 cm (3:18), previously characterized as a hemorrhagic/proteinaceous cysts. No specific follow-up imaging recommended. No focal bladder wall thickening. Stomach/Bowel: Normal appearance of the stomach. Large  duodenal diverticulum. No evidence of bowel wall thickening, distention, or inflammatory changes. Colonic diverticulosis without acute diverticulitis. Normal appendix. Vascular/Lymphatic: Aortic atherosclerosis. 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 No enlarged abdominal or pelvic lymph nodes. Reproductive: No adnexal masses. Other: No free fluid, fluid collection, or free air. Unchanged cystic focus adjacent to the right external iliac vessels measuring 2.0 x 1.8 cm (3:57), likely present since at least 05/25/2010 when it was located slightly more inferiorly, likely benign. Musculoskeletal: No acute or abnormal lytic or blastic osseous lesions. Partially imaged left femoral fixation hardware appears intact. Partially imaged bilateral breast implants. IMPRESSION: 1. No acute abnormality in the abdomen or pelvis. 2. Colonic diverticulosis without acute diverticulitis. 3. Aortic Atherosclerosis (ICD10-I70.0). 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 Electronically Signed   By: Agustin Cree M.D.   On: 11/13/2022 15:38   DG Chest Portable 1  View  Result Date: 11/13/2022 CLINICAL DATA:  Chest pain. EXAM: PORTABLE CHEST 1 VIEW COMPARISON:  September 23, 2022. FINDINGS: Stable cardiomediastinal silhouette. Hyperexpansion of the lungs is noted. Both lungs are clear. The visualized skeletal structures are unremarkable. IMPRESSION: No active disease.  Stable hyperexpansion of the lungs. Electronically Signed   By: Lupita Raider M.D.   On: 11/13/2022 12:13    Procedures Procedures    Medications Ordered in ED Medications  ondansetron (ZOFRAN) injection 4 mg (4 mg Intravenous Given 11/13/22 1109)  sodium chloride 0.9 % bolus 1,000 mL (0 mLs Intravenous Stopped 11/13/22 1328)  iohexol (OMNIPAQUE) 350 MG/ML injection 75 mL (75 mLs Intravenous Contrast Given 11/13/22 1409)    ED Course/ Medical Decision Making/ A&P Clinical Course as of 11/13/22 1639  Thu Nov 13, 2022  1453 CT ABDOMEN PELVIS W CONTRAST [MP]    Clinical Course User Index [MP] Royanne Foots, DO                                 Medical Decision Making 77 year old female with history as above presenting for symptoms including generalized weakness, fatigue, chest pain, nausea and vomiting.  Vital signs within normal limits.  Benign physical exam without focal neurologic deficit on my initial assessment.  Differential diagnosis includes dysrhythmia, ACS, electrolyte imbalance, pneumonia or intra-abdominal infection.  No evidence of ACS based on EKG and high-sensitivity troponin No evidence of dysrhythmia on EKG.  Normal sinus rhythm at 62 bpm Labs notable for hyponatremia which patient has had in the past.  No evidence of renal impairment or leukocytosis that would be concerning for acute infection Chest x-ray shows no acute disease or focal consolidation CT abdomen pelvis shows no acute findings but does show atherosclerosis and diverticulosis which I informed the patient and her husband of  Unclear cause of patient's nausea vomiting and chest pain.  I instructed her to  follow-up with her primary care team and have labs redrawn to evaluate for recurrent hyponatremia.  Stable for discharge  Amount and/or Complexity of Data Reviewed Labs: ordered. Radiology: ordered. Decision-making details documented in ED Course.  Risk Prescription drug management.           Final Clinical Impression(s) / ED Diagnoses Final diagnoses:  Chest pain, unspecified type  Other fatigue  Hyponatremia    Rx / DC Orders ED Discharge Orders     None         Royanne Foots, DO 11/13/22 1639

## 2022-11-13 NOTE — Discharge Instructions (Signed)
You were seen in the department for chest pain and feelings of generalized weakness The CAT scan did not show any concerning findings Your EKG and chest x-ray looked okay here today as well Your blood work did show low sodium again.  Considering that you have had this in the past, it is important that you follow-up with your primary care doctor Please have labs redrawn within next week to see if your sodium is still low Return to the emergency provide for chest pain, trouble breathing or any other concerns

## 2022-11-14 ENCOUNTER — Telehealth: Payer: Self-pay | Admitting: Internal Medicine

## 2022-11-14 ENCOUNTER — Ambulatory Visit: Payer: 59 | Admitting: Internal Medicine

## 2022-11-14 NOTE — Telephone Encounter (Signed)
There is no one else that I know of as I explained to her at her visit.  She can discuss with her cardiologist.

## 2022-11-14 NOTE — Telephone Encounter (Signed)
Patient called about a referral she had to Dr. Manson Passey for POTS. She said they are out of network and would like to know if there is someone else she can be referred to in [place]. Patient would like a call back at [phone].

## 2022-11-17 NOTE — Telephone Encounter (Signed)
She should take the midodrine if her BP is < 100 or keep dropping < 100.  I had increased the dose to three times a day because her readings at home were still consisently low on the twice a day dosing so it depends on her readings.   It may be a good idea for her to follow up with me next month if she can to review her readings.  She can also call in with her BP readings so we can adjust the medication if needed.     She should stop the midodrine if her BP is 120 or greater

## 2022-11-18 NOTE — Telephone Encounter (Signed)
Spoke with Dr. Lawerance Bach  Order BMP and have her come this week for labs.  If she wants to follow up next week she can.

## 2022-11-19 ENCOUNTER — Other Ambulatory Visit: Payer: Self-pay

## 2022-11-19 ENCOUNTER — Telehealth: Payer: Self-pay | Admitting: Cardiovascular Disease

## 2022-11-19 DIAGNOSIS — E871 Hypo-osmolality and hyponatremia: Secondary | ICD-10-CM

## 2022-11-19 NOTE — Telephone Encounter (Signed)
Spoke with patient today and lab order made for tomorrow.   Will call [facility] tomorrow and ask about the compression socks. Patient said she was told she would need to know the level before she could get her socks.

## 2022-11-19 NOTE — Telephone Encounter (Signed)
Pt c/o Syncope: STAT if syncope occurred within 30 minutes and pt complains of lightheadedness High Priority if episode of passing out, completely, today or in last 24 hours   Did you pass out today? No   When is the last time you passed out? Does not know   Has this occurred multiple times? Yes    Did you have any symptoms prior to passing out? Just feels like she is about to pass out and has to lay down

## 2022-11-19 NOTE — Telephone Encounter (Signed)
Called and spoke with patient. She reports that over the past 3 months when she is changing positions she feels dizzy, weak and feels like her heart rate increases. Patient says that she has to lay down for a while, she usually about an hour. Patient reports that this happens multiple times a day. Patient says that her systolic blood pressure runs between 90-110 and diastolic 50-60. Patient says she is unsure of her heart rate. Patient has Midodrine 10 mg prescribed three times daily but says that she is taking it two times daily. That her PCP told her to only take it twice a day. Patient scheduled with Dr. Mariah Milling 11/25/22.

## 2022-11-20 ENCOUNTER — Other Ambulatory Visit (INDEPENDENT_AMBULATORY_CARE_PROVIDER_SITE_OTHER): Payer: 59

## 2022-11-20 DIAGNOSIS — E871 Hypo-osmolality and hyponatremia: Secondary | ICD-10-CM

## 2022-11-20 LAB — BASIC METABOLIC PANEL
BUN: 19 mg/dL (ref 6–23)
CO2: 28 mEq/L (ref 19–32)
Calcium: 9.6 mg/dL (ref 8.4–10.5)
Chloride: 100 mEq/L (ref 96–112)
Creatinine, Ser: 0.63 mg/dL (ref 0.40–1.20)
GFR: 84.77 mL/min (ref >60.00)
Glucose, Bld: 92 mg/dL (ref 70–99)
Potassium: 3.9 mEq/L (ref 3.5–5.1)
Sodium: 135 mEq/L (ref 135–145)
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
## 2022-11-25 ENCOUNTER — Ambulatory Visit: Payer: 59 | Attending: Cardiovascular Disease | Admitting: Cardiovascular Disease

## 2022-11-25 ENCOUNTER — Encounter: Payer: Self-pay | Admitting: Cardiovascular Disease

## 2022-11-25 VITALS — BP 134/83 | HR 72 | Ht 68.0 in | Wt 132.2 lb

## 2022-11-25 DIAGNOSIS — E871 Hypo-osmolality and hyponatremia: Secondary | ICD-10-CM | POA: Diagnosis not present

## 2022-11-25 DIAGNOSIS — I951 Orthostatic hypotension: Secondary | ICD-10-CM

## 2022-11-25 DIAGNOSIS — R55 Syncope and collapse: Secondary | ICD-10-CM

## 2022-11-25 DIAGNOSIS — R002 Palpitations: Secondary | ICD-10-CM

## 2022-11-25 NOTE — Patient Instructions (Addendum)
Medication Instructions:  No changes  If you need a refill on your cardiac medications before your next appointment, please call your pharmacy.   Lab work: Your provider would like for you to return in  4 weeks to have the following labs drawn: BMP.   Please go to [facility] [address] ([facility] [address], [place] [address] You do not need an appointment.  They are open from 7:30 am-4 pm.  Lunch from 1:00 pm- 2:00 pm You will not need to be fasting.   You may also go to any of these [facility] locations:  [employer]  - [address] [employer] [address] Chief Technology Officer)      Testing/Procedures: No new testing needed  Follow-Up: At [facility], you and your health needs are our priority.  As part of our continuing mission to provide you with exceptional heart care, we have created designated Provider Care Teams.  These Care Teams include your primary Cardiologist (physician) and Advanced Practice Providers (APPs -  Physician Assistants and Nurse Practitioners) who all work together to provide you with the care you need, when you need it.  You will need a follow up appointment in 6 months  Providers on your designated Care Team:   Nicolasa Ducking, NP Eula Listen, PA-C Cadence Fransico Michael, [place]  COVID-19 Vaccine Information can be found at: [email or web address] For questions related to vaccine distribution or appointments, please email [email or web address] or call [phone].

## 2022-12-03 ENCOUNTER — Ambulatory Visit (INDEPENDENT_AMBULATORY_CARE_PROVIDER_SITE_OTHER): Payer: 59

## 2022-12-03 DIAGNOSIS — Z23 Encounter for immunization: Secondary | ICD-10-CM

## 2022-12-03 NOTE — Progress Notes (Signed)
After obtaining consent, and per orders of Dr. Lawerance Bach, injection of HDF given by Ferdie Ping. Patient instructed to report any adverse reaction to me immediately.

## 2022-12-04 NOTE — Telephone Encounter (Signed)
Ideal is 20-30 mm Hg, but this is harder to get on and since she does not have a lot of swelling it may be better for her to try the lower or in light compression, which is 15-20

## 2022-12-09 NOTE — Telephone Encounter (Signed)
Attempted to reach patient but no voicemail set up.  If she calls back please tell her Dr. Lawerance Bach has recommended the 15-20 size for her compression stockings.

## 2022-12-18 ENCOUNTER — Encounter: Payer: Self-pay | Admitting: Emergency Medicine

## 2022-12-25 LAB — BASIC METABOLIC PANEL
BUN/Creatinine Ratio: 33 — ABNORMAL HIGH (ref 12–28)
BUN: 17 mg/dL (ref 8–27)
CO2: 25 mmol/L (ref 20–29)
Calcium: 9.4 mg/dL (ref 8.7–10.3)
Chloride: 100 mmol/L (ref 96–106)
Creatinine, Ser: 0.51 mg/dL — ABNORMAL LOW (ref 0.57–1.00)
Glucose: 82 mg/dL (ref 70–99)
Potassium: 4.6 mmol/L (ref 3.5–5.2)
Sodium: 139 mmol/L (ref 134–144)
eGFR: 95 mL/min/{1.73_m2} (ref >59)

## 2022-12-29 ENCOUNTER — Encounter: Payer: Self-pay | Admitting: Emergency Medicine

## 2022-12-29 ENCOUNTER — Telehealth: Payer: Self-pay | Admitting: Cardiovascular Disease

## 2022-12-29 NOTE — Telephone Encounter (Signed)
Called patient and notified her of the following from Dr. Mariah Milling.  Lab work reviewed,  normal basic metabolic panel   Patient verbalizes understanding.

## 2022-12-29 NOTE — Telephone Encounter (Signed)
Patient returned RN's call regarding results. 

## 2023-01-05 ENCOUNTER — Telehealth: Payer: Self-pay | Admitting: Nurse Practitioner

## 2023-01-05 NOTE — Telephone Encounter (Signed)
Inbound call from patient, states she believes she is having an IBS flare up. Patient states she is in extremely bad shape and would like to discuss relief with a nurse as soon as possible. Patient was advised Dr. Marvell Fuller availability is for January and all PA's are completely booked. Offered for patient to be placed on cancellation list, she declined.

## 2023-01-05 NOTE — Telephone Encounter (Signed)
Pt stated that she has been having ongoing issues with constipation and diarrhea for over a month associated with abdominal pain. Pt was scheduled for an office visit with Dr. Leone Payor on 01/06/2023 at 2:50 PM. Pt made aware.  Pt verbalized understanding with all questions answered.

## 2023-01-06 ENCOUNTER — Ambulatory Visit (INDEPENDENT_AMBULATORY_CARE_PROVIDER_SITE_OTHER): Payer: 59 | Admitting: Internal Medicine

## 2023-01-06 ENCOUNTER — Encounter: Payer: Self-pay | Admitting: Internal Medicine

## 2023-01-06 VITALS — BP 112/68 | HR 93 | Ht 68.0 in | Wt 142.0 lb

## 2023-01-06 DIAGNOSIS — K5902 Outlet dysfunction constipation: Secondary | ICD-10-CM

## 2023-01-06 DIAGNOSIS — K5989 Other specified functional intestinal disorders: Secondary | ICD-10-CM | POA: Diagnosis not present

## 2023-01-06 DIAGNOSIS — K582 Mixed irritable bowel syndrome: Secondary | ICD-10-CM | POA: Diagnosis not present

## 2023-01-06 NOTE — Progress Notes (Signed)
[name] 76 y.o. 09-02-1945 [id number]  Assessment & Plan:   Encounter Diagnoses  Name Primary?   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  I have recommended she get on MiraLAX on a daily basis and take 2 Dulcolax if she has not moved her bowels in 2 days each time on an as-needed basis.  Follow-up as needed otherwise.    Subjective:   Chief Complaint: Constipation and diarrhea  HPI 77 year old woman with a long history of IBS as well as some lymphocytic colitis in the past.  She has a alternating constipation and diarrhea problem that tends towards constipation.  She also has a history of dyssynergic defecation proven by anorectal manometry.  Pelvic floor physical therapy has been recommended multiple times but she has not gone.   Husband is present with her visit today.  Discussed the use of AI scribe software for clinical note transcription with the patient, who gave verbal consent to proceed.  History of Present Illness   The patient presents with a longstanding history of bowel irregularity, characterized by alternating constipation and diarrhea. She describes her stools as either "great old big hard balls like clay" or "little bitty balls," with occasional episodes of diarrhea. The patient reports that she may go up to four days or even a month without a bowel movement, and only occasionally have what she considers a "decent" bowel movement.  To manage her bowel irregularity, the patient has been taking a daily stool softener and occasionally uses Miralax. During severe episodes of constipation, she has resorted to using two Dulcolax tablets and a suppository, even manually disimpacting herself. For episodes of severe diarrhea, she takes Imodium.  The patient also reports abdominal pain, localized in the  epigastric and suprapubic regions. She has noticed some blood on the toilet paper after bowel movements. She has been experiencing fatigue, which she attributes to her bowel issues, and has had difficulty sleeping due to abdominal pain and chills.  The patient has a history of diverticulosis and is concerned about the impact of her high-fiber diet on her condition. She has been taking fiber supplements two to three times a day and has tried to increase her dietary fiber intake through cereal. However, she believes that the high fiber content may be exacerbating her abdominal pain and cramping.  The patient's bowel irregularity and associated symptoms have been a longstanding issue, and she expresses concern about the safety and efficacy of her current management strategies. She is particularly concerned about the long-term use of Miralax and the potential for exacerbating her diverticulosis with a high-fiber diet.      She was seen here by Alcide Evener in June of this year with similar complaints.  CT abd/pelvis 11/2022 IMPRESSION: 1. No acute abnormality in the abdomen or pelvis. 2. Colonic diverticulosis without acute diverticulitis. 3. Aortic Atherosclerosis (ICD10-I70.0). 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 Wt Readings from Last 3 Encounters:  01/06/23 142 lb (64.4 kg)  11/25/22 132 lb 4 oz (60 kg)  11/13/22 130 lb (59 kg)   Colonoscopy December 09, 2019 at which point she was having a lot of diarrhea Small colonic polyps s/p polypectomy.  Both were adenomatous -Moderate sigmoid diverticulosis. -Otherwise normal colonoscopy to TI. -S/P random colonic and TI biopsies.  TI biopsies were normal colon biopsies demonstrated lymphocytic colitis  Allergies  Allergen Reactions   Tape Rash   Albuterol     heartburn   Cymbalta [Duloxetine Hcl]     Diarrhea, nausea, anxiety worse, shaky   Latex Other (See Comments)   Lyrica [Pregabalin] Other (See  Comments)    Sedation   Neurontin [Gabapentin] Other (See Comments)    Sedation   Nitrofuran Derivatives Other (See Comments)    "tingling"   Paxil [Paroxetine Hcl]     Bowel upset, tingling   Prozac [Fluoxetine Hcl] Other (See Comments)    Made patient worse and constipation   Current Meds  Medication Sig   Azelastine-Fluticasone 137-50 MCG/ACT SUSP Place 1 spray into the nose every 12 (twelve) hours.   Budeson-Glycopyrrol-Formoterol (BREZTRI AEROSPHERE) 160-9-4.8 MCG/ACT AERO Inhale 2 puffs into the lungs 2 (two) times daily.   Cyanocobalamin (VITAMIN B-12 PO) Take 1 tablet by mouth daily.   famotidine (PEPCID) 40 MG tablet Take 1 tablet (40 mg total) by mouth daily. For heartburn   FLUoxetine (PROZAC) 10 MG capsule Take 10 mg by mouth daily.   levalbuterol (XOPENEX HFA) 45 MCG/ACT inhaler Inhale 1-2 puffs into the lungs every 4 (four) hours as needed for wheezing.   METAMUCIL FIBER PO Take by mouth in the morning and at bedtime.   midodrine (PROAMATINE) 10 MG tablet Take 1 tablet (10 mg total) by mouth 3 (three) times daily.   pantoprazole (PROTONIX) 40 MG tablet Take 1 tablet (40 mg total) by mouth daily.   Probiotic Product (ALIGN) 4 MG CAPS Take 1 capsule by mouth as needed.   RESTASIS 0.05 % ophthalmic emulsion 1 drop 2 (two) times daily.   rosuvastatin (CRESTOR) 5 MG tablet Take 1 tablet (5 mg total) by mouth daily. For cholesterol   traZODone (DESYREL) 50 MG tablet Take 50 mg by mouth at bedtime.   vitamin C (ASCORBIC ACID) 500 MG tablet Take 500 mg by mouth daily.   Past Medical History:  Diagnosis Date   Allergy    Anemia    Anxiety    Asthma    Cataract    bil cateracts removed   Complication of anesthesia    Depression    generalized anxiety disorder   Diverticulosis    Duodenal diverticulum    Family history of adverse reaction to anesthesia    Mother and Daughters- N/V   Fibromyalgia    Gastroesophageal reflux disease with hiatal hernia    Hiatal hernia     History of kidney stones    Hyperlipidemia    IBS (irritable bowel syndrome)    Lymphocytic colitis    Dr Jarold Motto   Migraine headache    Mitral valve prolapse    Neuropathy    Osteopenia    BMD ordered by GYN   Osteoporosis    Peripheral neuropathy    treated as RLS by  Neurology   PONV (postoperative nausea and vomiting)    Restless leg syndrome    Schatzki's ring    History of   Sleep apnea    On CPAP, has not been using   Spondylosis    Past Surgical History:  Procedure Laterality Date   ANAL RECTAL MANOMETRY N/A 11/14/2015   Procedure: ANO RECTAL MANOMETRY;  Surgeon: Iva Boop, MD;  Location: [facility];  Service: Endoscopy;  Laterality: N/A;   BREAST ENHANCEMENT SURGERY     CATARACT EXTRACTION Bilateral    cataract surgery Left    Dr Hazle Quant   CHOLECYSTECTOMY     COLONOSCOPY  ESOPHAGEAL DILATION     X 2   ROTATOR CUFF REPAIR Left    SINUS ENDO W/FUSION N/A 05/30/2014   Procedure: REVISION  FRONTAL SINUS SURGERY WITH FUSION SCAN;  Surgeon: Osborn Coho, MD;  Location: [facility] OR;  Service: ENT;  Laterality: N/A;   SINUS SURGERY WITH INSTATRAK     x 2   TUBAL LIGATION     UPPER GASTROINTESTINAL ENDOSCOPY     Vaginal cystectomy     x 2   VAGINAL HYSTERECTOMY  05/2007   Vaginal repair, Dr Nicholas Lose.  Partial  hysterectomy.   WRIST ARTHROSCOPY     Social History   Social History Narrative   HAS REGULAR EXERCISE   DAILY CAFFEINE: 1-2 CUPS   Patient is right handed.   family history includes Anemia in her mother; Coronary artery disease in her brother; Diabetes in her maternal uncle; Heart attack in her father and paternal uncle; Hypertension in her mother; Hypertension (age of onset: 57) in her father; Neuropathy in her mother; Stroke (age of onset: 75) in her father.   Review of Systems As per HPI  Objective:   Physical Exam BP 112/68   Pulse 93   Ht 5\' 8"  (1.727 m)   Wt 142 lb (64.4 kg)   BMI 21.59 kg/m   Physical Exam   ABDOMEN: Flat, bowel  sounds present, soft, localized tenderness in epigastric and suprapubic areas. RECTAL: No impaction or hemorrhoids present.       Deanna may NP present for rectal exam

## 2023-01-06 NOTE — Patient Instructions (Addendum)
VISIT SUMMARY:  During today's visit, we discussed your ongoing issues with bowel irregularity, including alternating constipation and diarrhea, as well as your abdominal pain and occasional rectal bleeding. We reviewed your current management strategies and made some adjustments to help improve your symptoms.  YOUR PLAN:  -CHRONIC CONSTIPATION: Chronic constipation means having infrequent bowel movements or difficulty passing stools. To help manage this, we recommend increasing your Miralax to daily dosing. If you do not have a bowel movement for two days, take two Dulcolax tablets that night.   -ABDOMINAL PAIN: Your abdominal pain, located in the upper and lower parts of your abdomen, is likely related to your constipation. Continuing with the management plan for your chronic constipation should help alleviate this pain.  -RECTAL BLEEDING: The occasional blood you see on the toilet paper is likely due to irritation from constipation and straining. Please monitor for any changes or increase in bleeding, and if this occurs, we may need to evaluate further.  INSTRUCTIONS:  Please follow the updated plan for managing your chronic constipation, including daily Miralax and taking Dulcolax if needed. Continue with your fiber supplements. Monitor your rectal bleeding and report any changes.  let me know if this regimen is not effective. And it is safe to take MiraLax and Dulcolax on a regular basis.  I appreciate the opportunity to care for you. Stan Head, MD, [facility]

## 2023-01-16 ENCOUNTER — Telehealth: Payer: Self-pay | Admitting: Internal Medicine

## 2023-01-16 DIAGNOSIS — M810 Age-related osteoporosis without current pathological fracture: Secondary | ICD-10-CM

## 2023-01-16 NOTE — Telephone Encounter (Signed)
Pt want referral sent to [facility].

## 2023-01-19 NOTE — Telephone Encounter (Signed)
Referral ordered

## 2023-01-27 ENCOUNTER — Ambulatory Visit: Payer: 59 | Admitting: Diagnostic Neuroimaging

## 2023-02-03 ENCOUNTER — Other Ambulatory Visit: Payer: Self-pay | Admitting: Internal Medicine

## 2023-02-03 ENCOUNTER — Encounter: Payer: Self-pay | Admitting: Internal Medicine

## 2023-02-03 ENCOUNTER — Ambulatory Visit (INDEPENDENT_AMBULATORY_CARE_PROVIDER_SITE_OTHER): Payer: 59 | Admitting: Internal Medicine

## 2023-02-03 VITALS — BP 118/78 | HR 69 | Ht 68.0 in | Wt 135.8 lb

## 2023-02-03 DIAGNOSIS — M810 Age-related osteoporosis without current pathological fracture: Secondary | ICD-10-CM | POA: Diagnosis not present

## 2023-02-03 DIAGNOSIS — E559 Vitamin D deficiency, unspecified: Secondary | ICD-10-CM

## 2023-02-03 NOTE — Progress Notes (Unsigned)
Patient ID: [name], female   DOB: 06-25-1945, 77 y.o.   MRN: [id number]   HPI  [name] is a 77 y.o.-year-old female, referred by her PCP, Dr. Lawerance Bach, for management of osteoporosis (OP).  I previously saw the patient in 03/2018, almost 5 years ago.  He is here accompanied by her husband who offers part of the history especially related to fracture history, activity, and symptoms.  Interim history: Since last visit, she had a left intertrochanteric femoral fracture 08/2018 after a fall going down steps, for which she had to have ORIF.  She sees Delbert Harness. She also had a wrist fracture in 05/2021 - fell on uneven ground. She sees Dr. Charlann Boxer.  She also has trochanteric bursitis.  She is on steroid inj's in hip and back. She was also diagnosed with DDD.   She also has orthostatic hypotension (dysautonomia) and is on midodrine.  She has had syncopal episodes.  I am from this.  Pt was dx with OP in 12/2017.  I reviewed pt's DXA scan reports:   L1-L3 (L4) T score FN T score 33% distal Radius  10/07/2021 ([facility]) -2.4 RFN: -2.0 LFN: n/a -2.3  12/16/2017 ([facility]) -2.5 (-5.2%*) RFN: -1.9 LFN: -2.2 n/a  05/17/2015 ([facility]) -2.2  RFN: -1.9 LFN: -2.1 n/a   She does have neuropathy and back pain.  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  It is unclear whether this  could be related to her Fosamax use in the past or not.  She does have a h/o vitamin D deficiency. Reviewed available vit D levels: Lab Results  Component Value Date   VD25OH 64.14 01/21/2022   VD25OH 38.55 06/25/2021   VD25OH 46.43 10/15/2020   VD25OH 62.16 04/27/2020   VD25OH 62.87 12/26/2019   VD25OH 69.69 07/21/2019   VD25OH 29.77 (L) 12/22/2018   VD25OH 30.94 12/18/2017   VD25OH 32.30 12/16/2016   VD25OH 44 11/14/2015   Pt is on: - calcium 1000 mg daily - vitamin D 1000 IU daily + 800 IU from  [employer] supplement (+ vitamin K2)-started 01/2018  No weight bearing exercises.   She does not take high vitamin A doses.  Menopause was in her late 30s and she was on HRT for years.  FH of osteoporosis: Mother, aunt.  No h/o persistent hyper/hypocalcemia or hyperparathyroidism. No h/o kidney stones. Lab Results  Component Value Date   CALCIUM 9.4 12/24/2022   CALCIUM 9.6 11/20/2022   CALCIUM 8.0 (L) 11/13/2022   CALCIUM 9.2 08/13/2022   CALCIUM 9.5 06/12/2022   CALCIUM 10.0 05/12/2022   CALCIUM 8.4 (L) 04/30/2022   CALCIUM 9.2 01/21/2022   CALCIUM 9.2 10/09/2021   CALCIUM 9.4 06/25/2021   No h/o thyrotoxicosis. Reviewed TSH recent levels:  Lab Results  Component Value Date   TSH 2.69 06/12/2022   TSH 3.76 01/21/2022   TSH 2.88 10/09/2021   TSH 3.13 12/26/2019   TSH 2.51 07/21/2019   No h/o CKD. Last BUN/Cr: Lab Results  Component Value Date   BUN 17 12/24/2022   CREATININE 0.51 (L) 12/24/2022   She also has a history of IBS with constipation, GERD-on Protonix occs., hyperlipidemia, prediabetes. Before her last visit she mentioned that she lost approximately 15 pounds due to anxiety and depression.  ROS: Constitutional: + weight gain (since our last in person visit in 2020, she gained 18 pounds, but in the last month she lost  approximately 7 pounds), + fatigue, no subjective hyperthermia, no subjective hypothermia, + excessive urination and nocturia, + poor sleep Eyes: no blurry vision, no xerophthalmia ENT: no sore throat, no nodules palpated in neck, no dysphagia, no odynophagia, no hoarseness, + tinnitus, + hypoacusis Cardiovascular: no CP, + SOB, + palpitations, no leg swelling Respiratory: no cough, + SOB, no wheezing Gastrointestinal: no N, no V, no D, + C, + acid reflux Musculoskeletal: + Both muscle and joint aches Skin: no rash, + hair loss, + easy bruising Neurological: + tremors, no numbness or tingling/no dizziness/no HAs Psychiatric: + Both  anxiety/depression + Low libido  I reviewed pt's medications, allergies, PMH, social hx, family hx, and changes were documented in the history of present illness. Otherwise, unchanged from my initial visit note.  Past Medical History:  Diagnosis Date   Allergy    Anemia    Anxiety    Asthma    Cataract    bil cateracts removed   Complication of anesthesia    Depression    generalized anxiety disorder   Diverticulosis    Duodenal diverticulum    Family history of adverse reaction to anesthesia    Mother and Daughters- N/V   Fibromyalgia    Gastroesophageal reflux disease with hiatal hernia    Hiatal hernia    History of kidney stones    Hyperlipidemia    IBS (irritable bowel syndrome)    Lymphocytic colitis    Dr Jarold Motto   Migraine headache    Mitral valve prolapse    Neuropathy    Osteopenia    BMD ordered by GYN   Osteoporosis    Peripheral neuropathy    treated as RLS by  Neurology   PONV (postoperative nausea and vomiting)    Restless leg syndrome    Schatzki's ring    History of   Sleep apnea    On CPAP, has not been using   Spondylosis    Past Surgical History:  Procedure Laterality Date   ANAL RECTAL MANOMETRY N/A 11/14/2015   Procedure: ANO RECTAL MANOMETRY;  Surgeon: Iva Boop, MD;  Location: [facility];  Service: Endoscopy;  Laterality: N/A;   BREAST ENHANCEMENT SURGERY     CATARACT EXTRACTION Bilateral    cataract surgery Left    Dr Hazle Quant   CHOLECYSTECTOMY     COLONOSCOPY     ESOPHAGEAL DILATION     X 2   ROTATOR CUFF REPAIR Left    SINUS ENDO W/FUSION N/A 05/30/2014   Procedure: REVISION  FRONTAL SINUS SURGERY WITH FUSION SCAN;  Surgeon: Osborn Coho, MD;  [facility];  Service: ENT;  Laterality: N/A;   SINUS SURGERY WITH INSTATRAK     x 2   TUBAL LIGATION     UPPER GASTROINTESTINAL ENDOSCOPY     Vaginal cystectomy     x 2   VAGINAL HYSTERECTOMY  05/2007   Vaginal repair, Dr Nicholas Lose.  Partial  hysterectomy.   WRIST  ARTHROSCOPY     Social History   Socioeconomic History   Marital status: Married    Spouse name: Not on file   Number of children: 2   Years of education: 54   Highest education level: Not on file  Occupational History   Occupation: retired    Associate Professor: [employer]  Tobacco Use   Smoking status: Former    Current packs/day: 0.00    Average packs/day: 0.5 packs/day for 15.0 years (7.5 ttl pk-yrs)    Types: Cigarettes  Start date: 03/04/1983    Quit date: 03/03/1998    Years since quitting: 24.9   Smokeless tobacco: Never   Tobacco comments:    smoked 1973- 2006, up to <  1  ppd  Vaping Use   Vaping status: Never Used  Substance and Sexual Activity   Alcohol use: No    Alcohol/week: 0.0 standard drinks of alcohol   Drug use: No   Sexual activity: Yes    Partners: Male    Comment: 1st intercourse- 33, partners- 2, married- 22 yrs   Other Topics Concern   Not on file  Social History Narrative   HAS REGULAR EXERCISE   DAILY CAFFEINE: 1-2 CUPS   Patient is right handed.   Social Determinants of Health   Financial Resource Strain: Not on file  Food Insecurity: Low Risk  (10/13/2022)   Received from [facility]   Hunger Vital Sign    Worried About Running Out of Food in the Last Year: Never true    Ran Out of Food in the Last Year: Never true  Transportation Needs: Not on file (10/13/2022)  Physical Activity: Not on file  Stress: Not on file  Social Connections: Not on file  Intimate Partner Violence: Not on file   Current Outpatient Medications on File Prior to Visit  Medication Sig Dispense Refill   Azelastine-Fluticasone 137-50 MCG/ACT SUSP Place 1 spray into the nose every 12 (twelve) hours. 23 g 8   Budeson-Glycopyrrol-Formoterol (BREZTRI AEROSPHERE) 160-9-4.8 MCG/ACT AERO Inhale 2 puffs into the lungs 2 (two) times daily. 10.7 g 11   Cyanocobalamin (VITAMIN B-12 PO) Take 1 tablet by mouth daily.     famotidine (PEPCID) 40 MG tablet Take 1 tablet (40 mg total) by  mouth daily. For heartburn 90 tablet 1   FLUoxetine (PROZAC) 10 MG capsule Take 10 mg by mouth daily.     levalbuterol (XOPENEX HFA) 45 MCG/ACT inhaler Inhale 1-2 puffs into the lungs every 4 (four) hours as needed for wheezing. 1 each 12   midodrine (PROAMATINE) 10 MG tablet Take 1 tablet (10 mg total) by mouth 3 (three) times daily. 90 tablet 5   pantoprazole (PROTONIX) 40 MG tablet Take 1 tablet (40 mg total) by mouth daily. 90 tablet 5   Probiotic Product (ALIGN) 4 MG CAPS Take 1 capsule by mouth as needed.     RESTASIS 0.05 % ophthalmic emulsion 1 drop 2 (two) times daily.     rosuvastatin (CRESTOR) 5 MG tablet Take 1 tablet (5 mg total) by mouth daily. For cholesterol 90 tablet 3   traZODone (DESYREL) 50 MG tablet Take 50 mg by mouth at bedtime.     vitamin C (ASCORBIC ACID) 500 MG tablet Take 500 mg by mouth daily.     [DISCONTINUED] Hyoscyamine-Phenyltoloxamine (DIGEX NF) [id number] MG CAPS Take one tablet by mouth as needed for abdominal cramping 24 each 0   No current facility-administered medications on file prior to visit.   Allergies  Allergen Reactions   Tape Rash   Albuterol     heartburn   Cymbalta [Duloxetine Hcl]     Diarrhea, nausea, anxiety worse, shaky   Latex Other (See Comments)   Lyrica [Pregabalin] Other (See Comments)    Sedation   Neurontin [Gabapentin] Other (See Comments)    Sedation   Nitrofuran Derivatives Other (See Comments)    "tingling"   Paxil [Paroxetine Hcl]     Bowel upset, tingling   Prozac [Fluoxetine Hcl] Other (See Comments)  Made patient worse and constipation   Family History  Problem Relation Age of Onset   Hypertension Father 32   Stroke Father 74   Heart attack Father         ? in 66s   Hypertension Mother    Neuropathy Mother    Anemia Mother    Coronary artery disease Brother        Stent placement in 32s   Diabetes Maternal Uncle    Heart attack Paternal Uncle        SEVEN , ? age   Colon cancer Neg Hx    Seizures Neg  Hx    Esophageal cancer Neg Hx    Rectal cancer Neg Hx    Stomach cancer Neg Hx    PE: BP 118/78   Pulse 69   Ht 5\' 8"  (1.727 m)   Wt 135 lb 12.8 oz (61.6 kg)   SpO2 97%   BMI 20.65 kg/m  Wt Readings from Last 3 Encounters:  02/03/23 135 lb 12.8 oz (61.6 kg)  01/06/23 142 lb (64.4 kg)  11/25/22 132 lb 4 oz (60 kg)   Constitutional: normal weight, in NAD, + kyphosis Eyes:  EOMI, no exophthalmos ENT: no neck masses, no cervical lymphadenopathy Cardiovascular: RRR, No MRG Respiratory: CTA B Musculoskeletal: no deformities other than kyphosis Skin:no rashes Neurological: + tremor with outstretched hands  Assessment: 1. Osteoporosis  2.  History of vitamin D deficiency  Plan: 1. Osteoporosis - likely postmenopausal/age-related, she has FH of OP - She returns after long absence of approximately 5 years, in which interval she had 2 fractures: a hip and a wrist fracture.  These were complicated as she required surgery with intramedullary rods and screws -At today's visit I discussed with the patient and now also with her husband about her increased risk of fracture especially in the setting of previous fractures and history of falls. We reviewed her available DXA scan reports together.  The latest bone density scan was obtained on the different machine than previously so the scores are not directly comparable.  However, she has borderline T-scores between osteopenia and osteoporosis.  We discussed that the risk of fracture does not have a clear threshold, and especially in the setting of her recent hip and wrist fracture, she is at an increased risk for fractures.  At last visit, we reviewed the medication classes and I did recommend pharmaceutical treatment, but she declined at that time.  Since then, after  discussion with PCP, she was referred back to potentially start bone therapy.  - we reviewed her dietary and supplemental calcium and vitamin D intake. I recommended to make sure  she gets 1000-1200 mg of calcium daily preferentially from diet and I will check vit D today to see if she needs further supplementation, however, latest level was reviewed and this was normal in 2023. - discussed fall precautions   - given handout from [facility] Re: weight bearing exercises - advised to do this every day or at least 5/7 days - I also recommended the [facility] center - for skeletal loading - did not do this.  At last visit I also recommended physical therapy at [facility] - did that. - we discussed about maintaining a good amount of protein in her diet. The recommended daily protein intake is ~0.8 g per kilogram per day (approximately 50 g a day for her). I advised her to try to aim for this amount, since a diet low in proteins  can exacerbate osteoporosis.  She is not smoking or drinking > 2 alcoholic drinks a day. - at this visit, we again reviewed different osteoporotic medication classes, benefits and side effects (including atypical fractures and ONJ).  We again discussed that it is not very clear whether her distant history of Fosamax use could have caused her jaw degeneration.  However, I did advise her that both bisphosphonates and Prolia can cause ONJ so we have to be careful if we do start these for her.  I explained that the benefits/risk ratio is weighing greatly towards benefits. - I explained that first options are sq denosumab (Prolia) sq every 6 months, and the second option would zoledronic acid (Reclast) iv 1x a year for 1-2 years. I would use Teriparatide/Abaloparatide (which are daily subcu medication) or Romosozumab (monthly) as a last resort.  She agrees to try Prolia. - Pt was given reading information about Prolia - Will check her vitamin D level today and if normal, will arrange for a Prolia inj - will check a new DXA scan in 2 years after starting Prolia - will see pt back in a year  2.  History of vitamin D deficiency -She continues  on vitamin D supplementation (1000 IU  per day) -Reviewed latest level which was normal Lab Results  Component Value Date   VD25OH 64.14 01/21/2022  -We will repeat the level today  Carlus Pavlov, MD PhD [facility]

## 2023-02-03 NOTE — Patient Instructions (Addendum)
Please continue 1000 units vitamin D daily.  Please stop at the lab.  Please come back for a follow-up appointment in 1 year.  How Can I Prevent Falls? Men and women with osteoporosis need to take care not to fall down. Falls can break bones. Some reasons people fall are: Poor vision  Poor balance  Certain diseases that affect how you walk  Some types of medicine, such as sleeping pills.  Some tips to help prevent falls outdoors are: Use a cane or walker  Wear rubber-soled shoes so you don't slip  Walk on grass when sidewalks are slippery  In winter, put salt or kitty litter on icy sidewalks.  Some ways to help prevent falls indoors are: Keep rooms free of clutter, especially on floors  Use plastic or carpet runners on slippery floors  Wear low-heeled shoes that provide good support  Do not walk in socks, stockings, or slippers  Be sure carpets and area rugs have skid-proof backs or are tacked to the floor  Be sure stairs are well lit and have rails on both sides  Put grab bars on bathroom walls near tub, shower, and toilet  Use a rubber bath mat in the shower or tub  Keep a flashlight next to your bed  Use a sturdy step stool with a handrail and wide steps  Add more lights in rooms (and night lights) Buy a cordless phone to keep with you so that you don't have to rush to the phone       when it rings and so that you can call for help if you fall.   (adapted from http://www.niams.[employer].at)  Exercise for Strong Bones (from [facility]) There are two types of exercises that are important for building and maintaining bone density:  weight-bearing and muscle-strengthening exercises. Weight-bearing Exercises These exercises include activities that make you move against gravity while staying upright. Weight-bearing exercises can be high-impact or low-impact. High-impact weight-bearing exercises help build bones and  keep them strong. If you have broken a bone due to osteoporosis or are at risk of breaking a bone, you may need to avoid high-impact exercises. If you're not sure, you should check with your healthcare provider. Examples of high-impact weight-bearing exercises are: Dancing Doing high-impact aerobics Hiking Jogging/running Jumping Rope Stair climbing Tennis Low-impact weight-bearing exercises can also help keep bones strong and are a safe alternative if you cannot do high-impact exercises. Examples of low-impact weight-bearing exercises are: Using elliptical training machines Doing low-impact aerobics Using stair-step machines Fast walking on a treadmill or outside Muscle-Strengthening Exercises These exercises include activities where you move your body, a weight or some other resistance against gravity. They are also known as resistance exercises and include: Lifting weights Using elastic exercise bands Using weight machines Lifting your own body weight Functional movements, such as standing and rising up on your toes Yoga and Pilates can also improve strength, balance and flexibility. However, certain positions may not be safe for people with osteoporosis or those at increased risk of broken bones. For example, exercises that have you bend forward may increase the chance of breaking a bone in the spine. A physical therapist should be able to help you learn which exercises are safe and appropriate for you. Non-Impact Exercises Non-impact exercises can help you to improve balance, posture and how well you move in everyday activities. These exercises can also help to increase muscle strength and decrease the risk of falls and broken bones. Some of these exercises include:  Balance exercises that strengthen your legs and test your balance, such as Tai Chi, can decrease your risk of falls. Posture exercises that improve your posture and reduce rounded or "sloping" shoulders can help you decrease  the chance of breaking a bone, especially in the spine. Functional exercises that improve how well you move can help you with everyday activities and decrease your chance of falling and breaking a bone. For example, if you have trouble getting up from a chair or climbing stairs, you should do these activities as exercises. A physical therapist can teach you balance, posture and functional exercises. Starting a New Exercise Program If you haven't exercised regularly for a while, check with your healthcare provider before beginning a new exercise program--particularly if you have health problems such as heart disease, diabetes or high blood pressure. If you're at high risk of breaking a bone, you should work with a physical therapist to develop a safe exercise program. Once you have your healthcare provider's approval, start slowly. If you've already broken bones in the spine because of osteoporosis, be very careful to avoid activities that require reaching down, bending forward, rapid twisting motions, heavy lifting and those that increase your chance of a fall. As you get started, your muscles may feel sore for a day or two after you exercise. If soreness lasts longer, you may be working too hard and need to ease up. Exercises should be done in a pain-free range of motion. How Much Exercise Do You Need? Weight-bearing exercises 30 minutes on most days of the week. Do a 30-minutesession or multiple sessions spread out throughout the day. The benefits to your bones are the same.   Muscle-strengthening exercises Two to three days per week. If you don't have much time for strengthening/resistance training, do small amounts at a time. You can do just one body part each day. For example do arms one day, legs the next and trunk the next. You can also spread these exercises out during your normal day.  Balance, posture and functional exercises Every day or as often as needed. You may want to focus on one area more  than the others. If you have fallen or lose your balance, spend time doing balance exercises. If you are getting rounded shoulders, work more on posture exercises. If you have trouble climbing stairs or getting up from the couch, do more functional exercises. You can also perform these exercises at one time or spread them during your day. Work with a phyiscal therapist to learn the right exercises for you.   Denosumab: Patient drug information (Up-to-date) Copyright [phone] [employer] All rights reserved.  Brand Names: [place]  ProliaRivka Barbara What is this drug used for?  It is used to treat soft, brittle bones (osteoporosis).  It is used for bone growth.  It is used when treating some cancers.  It may be given to you for other reasons. Talk with the doctor. What do I need to tell my doctor BEFORE I take this drug?  All products:  If you have an allergy to denosumab or any other part of this drug.  If you are allergic to any drugs like this one, any other drugs, foods, or other substances. Tell your doctor about the allergy and what signs you had, like rash; hives; itching; shortness of breath; wheezing; cough; swelling of face, lips, tongue, or throat; or any other signs.  If you have low calcium levels.  ProliaT:  If you are pregnant or may  be pregnant. Do not take this drug if you are pregnant.  This is not a list of all drugs or health problems that interact with this drug.  Tell your doctor and pharmacist about all of your drugs (prescription or OTC, natural products, vitamins) and health problems. You must check to make sure that it is safe for you to take this drug with all of your drugs and health problems. Do not start, stop, or change the dose of any drug without checking with your doctor. What are some things I need to know or do while I take this drug?  All products:  Tell dentists, surgeons, and other doctors that you use this drug.  This drug may raise the chance of a broken  leg. Talk with your doctor.  Have your blood work checked. Talk with your doctor.  Have a bone density test. Talk with your doctor.  Take calcium and vitamin D as you were told by your doctor.  Have a dental exam before starting this drug.  Take good care of your teeth. See a dentist often.  If you smoke, talk with your doctor.  Do not give to a child. Talk with your doctor.  Tell your doctor if you are breast-feeding. You will need to talk about any risks to your baby.  Rivka Barbara:  This drug may cause harm to the unborn baby if you take it while you are pregnant. If you get pregnant while taking this drug, call your doctor right away.  ProliaT:  Very bad infections have been reported with use of this drug. If you have any infection, are taking antibiotics now or in the recent past, or have many infections, talk with your doctor.  You may have more chance of getting an infection. Wash hands often. Stay away from people with infections, colds, or flu.  Use birth control that you can trust to prevent pregnancy while taking this drug.  If you are a man and your sex partner is pregnant or gets pregnant at any time while you are being treated, talk with your doctor. What are some side effects that I need to call my doctor about right away?  WARNING/CAUTION: Even though it may be rare, some people may have very bad and sometimes deadly side effects when taking a drug. Tell your doctor or get medical help right away if you have any of the following signs or symptoms that may be related to a very bad side effect:  All products:  Signs of an allergic reaction, like rash; hives; itching; red, swollen, blistered, or peeling skin with or without fever; wheezing; tightness in the chest or throat; trouble breathing or talking; unusual hoarseness; or swelling of the mouth, face, lips, tongue, or throat.  Signs of low calcium levels like muscle cramps or spasms, numbness and tingling, or seizures.  Mouth sores.   Any new or strange groin, hip, or thigh pain.  This drug may cause jawbone problems. The chance may be higher the longer you take this drug. The chance may be higher if you have cancer, dental problems, dentures that do not fit well, anemia, blood clotting problems, or an infection. The chance may also be higher if you are having dental work or if you are getting chemo, some steroid drugs, or radiation. Call your doctor right away if you have jaw swelling or pain.  Xgeva:  Not hungry.  Muscle pain or weakness.  Seizures.  Shortness of breath.  ProliaT:  Signs of infection.  These include a fever of 100.71F (38C) or higher, chills, very bad sore throat, ear or sinus pain, cough, more sputum or change in color of sputum, pain with passing urine, mouth sores, wound that will not heal, or anal itching or pain.  Signs of a pancreas problem (pancreatitis) like very bad stomach pain, very bad back pain, or very bad upset stomach or throwing up.  Chest pain.  A heartbeat that does not feel normal.  Very bad skin irritation.  Feeling very tired or weak.  Bladder pain or pain when passing urine or change in how much urine is passed.  Passing urine often.  Swelling in the arms or legs. What are some other side effects of this drug?  All drugs may cause side effects. However, many people have no side effects or only have minor side effects. Call your doctor or get medical help if any of these side effects or any other side effects bother you or do not go away:  Xgeva:  Feeling tired or weak.  Headache.  Upset stomach or throwing up.  Loose stools (diarrhea).  Cough.  ProliaT:  Back pain.  Muscle or joint pain.  Sore throat.  Runny nose.  Pain in arms or legs.  These are not all of the side effects that may occur. If you have questions about side effects, call your doctor. Call your doctor for medical advice about side effects.  You may report side effects to your national health agency. How  is this drug best taken?  Use this drug as ordered by your doctor. Read and follow the dosing on the label closely.  It is given as a shot into the fatty part of the skin. What do I do if I miss a dose?  Call the doctor to find out what to do. How do I store and/or throw out this drug?  This drug will be given to you in a hospital or doctor's office. You will not store it at home.  Keep all drugs out of the reach of children and pets.  Check with your pharmacist about how to throw out unused drugs.  General drug facts  If your symptoms or health problems do not get better or if they become worse, call your doctor.  Do not share your drugs with others and do not take anyone else's drugs.  Keep a list of all your drugs (prescription, natural products, vitamins, OTC) with you. Give this list to your doctor.  Talk with the doctor before starting any new drug, including prescription or OTC, natural products, or vitamins.  Some drugs may have another patient information leaflet. If you have any questions about this drug, please talk with your doctor, pharmacist, or other health care provider.  If you think there has been an overdose, call your poison control center or get medical care right away. Be ready to tell or show what was taken, how much, and when it happened.

## 2023-02-04 ENCOUNTER — Encounter: Payer: Self-pay | Admitting: Diagnostic Neuroimaging

## 2023-02-04 ENCOUNTER — Telehealth: Payer: Self-pay

## 2023-02-04 ENCOUNTER — Encounter: Payer: Self-pay | Admitting: Internal Medicine

## 2023-02-04 ENCOUNTER — Ambulatory Visit (INDEPENDENT_AMBULATORY_CARE_PROVIDER_SITE_OTHER): Payer: 59 | Admitting: Diagnostic Neuroimaging

## 2023-02-04 VITALS — BP 116/75 | HR 61 | Ht 68.0 in | Wt 132.6 lb

## 2023-02-04 DIAGNOSIS — G629 Polyneuropathy, unspecified: Secondary | ICD-10-CM

## 2023-02-04 LAB — VITAMIN D 25 HYDROXY (VIT D DEFICIENCY, FRACTURES): Vit D, 25-Hydroxy: 40 ng/mL (ref 30–100)

## 2023-02-04 NOTE — Patient Instructions (Addendum)
FOOT PAIN / NUMBNESS --> NEUROPATHY (idiopathic / hereditary) - consider painful neuropathy treatment options vs pain mgmt clinic referral (apparently has tried and failed gabapentin, Lyrica and Cymbalta many years ago) -duloxetine 30-60mg  daily, amitriptyline 25-50mg  at bedtime, gabapentin 100-300mg  three times a day, pregabalin 75-150mg  twice a day -capsaicin cream, voltaren gel, lidocaine patch / cream, alpha-lipoic acid 600mg  daily - consider referral to podiatry or pain mgmt for symptom control

## 2023-02-04 NOTE — Progress Notes (Signed)
[facility]  PATIENT: [name] DOB: February 01, 1946  REFERRING CLINICIAN: Pincus Sanes, MD HISTORY FROM: patient REASON FOR VISIT: new consult   HISTORICAL  CHIEF COMPLAINT:  Chief Complaint  Patient presents with   Follow-up    Patient in room #7 and alone.    HISTORY OF PRESENT ILLNESS:   UPDATE (02/04/23, VRP): Since last visit, doing about the same. Symptoms are progressive. Severity mild-moderate. No alleviating or aggravating factors.    PRIOR HPI (12/11/21): 77 year old female here for evaluation of numbness and tingling.  Symptoms started around age 45 years old.  Started with numbness and tingling in the toes.  Gradually worsened over time.  Has seen several neurologist in the past including Dr. Anne Hahn.  Prior work-up has been negative.  She was diagnosed with idiopathic hereditary neuropathy.  She tried gabapentin, Lyrica and Cymbalta but could not tolerate these in the past.   REVIEW OF SYSTEMS: Full 14 system review of systems performed and negative with exception of: as per HPI.  ALLERGIES: Allergies  Allergen Reactions   Tape Rash   Albuterol     heartburn   Cymbalta [Duloxetine Hcl]     Diarrhea, nausea, anxiety worse, shaky   Latex Other (See Comments)   Lyrica [Pregabalin] Other (See Comments)    Sedation   Neurontin [Gabapentin] Other (See Comments)    Sedation   Nitrofuran Derivatives Other (See Comments)    "tingling"   Paxil [Paroxetine Hcl]     Bowel upset, tingling   Prozac [Fluoxetine Hcl] Other (See Comments)    Made patient worse and constipation    HOME MEDICATIONS: Outpatient Medications Prior to Visit  Medication Sig Dispense Refill   Azelastine-Fluticasone 137-50 MCG/ACT SUSP Place 1 spray into the nose every 12 (twelve) hours. 23 g 8   Budeson-Glycopyrrol-Formoterol (BREZTRI AEROSPHERE) 160-9-4.8 MCG/ACT AERO Inhale 2 puffs into the lungs 2 (two) times daily. 10.7 g 11   Cyanocobalamin (VITAMIN  B-12 PO) Take 1 tablet by mouth daily.     famotidine (PEPCID) 40 MG tablet Take 1 tablet (40 mg total) by mouth daily. For heartburn 90 tablet 1   FLUoxetine (PROZAC) 10 MG capsule Take 10 mg by mouth daily. (Patient not taking: Reported on 02/03/2023)     hydrOXYzine (ATARAX) 25 MG tablet Take 25 mg by mouth daily as needed.     levalbuterol (XOPENEX HFA) 45 MCG/ACT inhaler Inhale 1-2 puffs into the lungs every 4 (four) hours as needed for wheezing. 1 each 12   midodrine (PROAMATINE) 10 MG tablet TAKE 1 TABLET BY MOUTH TWICE A DAY 60 tablet 2   OVER THE COUNTER MEDICATION Bone Up 1000 mg 2 tabs TID     OVER THE COUNTER MEDICATION B-Right (B-Complex) 1 po daily     pantoprazole (PROTONIX) 40 MG tablet Take 1 tablet (40 mg total) by mouth daily. 90 tablet 5   Probiotic Product (ALIGN) 4 MG CAPS Take 1 capsule by mouth as needed.     RESTASIS 0.05 % ophthalmic emulsion 1 drop 2 (two) times daily.     rosuvastatin (CRESTOR) 5 MG tablet Take 1 tablet (5 mg total) by mouth daily. For cholesterol 90 tablet 3   traZODone (DESYREL) 50 MG tablet Take 50 mg by mouth at bedtime.     vitamin C (ASCORBIC ACID) 500 MG tablet Take 500 mg by mouth daily.     vitamin E 180 MG (400 UNITS) capsule Take 400 Units by mouth daily.  No facility-administered medications prior to visit.    PAST MEDICAL HISTORY: Past Medical History:  Diagnosis Date   Allergy    Anemia    Anxiety    Asthma    Cataract    bil cateracts removed   Complication of anesthesia    Depression    generalized anxiety disorder   Diverticulosis    Duodenal diverticulum    Family history of adverse reaction to anesthesia    Mother and Daughters- N/V   Fibromyalgia    Gastroesophageal reflux disease with hiatal hernia    Hiatal hernia    History of kidney stones    Hyperlipidemia    IBS (irritable bowel syndrome)    Lymphocytic colitis    Dr Jarold Motto   Migraine headache    Mitral valve prolapse    Neuropathy    Osteopenia     BMD ordered by GYN   Osteoporosis    Peripheral neuropathy    treated as RLS by  Neurology   PONV (postoperative nausea and vomiting)    Restless leg syndrome    Schatzki's ring    History of   Sleep apnea    On CPAP, has not been using   Spondylosis     PAST SURGICAL HISTORY: Past Surgical History:  Procedure Laterality Date   ANAL RECTAL MANOMETRY N/A 11/14/2015   Procedure: ANO RECTAL MANOMETRY;  Surgeon: Iva Boop, MD;  Location: [facility];  Service: Endoscopy;  Laterality: N/A;   BREAST ENHANCEMENT SURGERY     CATARACT EXTRACTION Bilateral    cataract surgery Left    Dr Hazle Quant   CHOLECYSTECTOMY     COLONOSCOPY     ESOPHAGEAL DILATION     X 2   ROTATOR CUFF REPAIR Left    SINUS ENDO W/FUSION N/A 05/30/2014   Procedure: REVISION  FRONTAL SINUS SURGERY WITH FUSION SCAN;  Surgeon: Osborn Coho, MD;  Location: [facility] OR;  Service: ENT;  Laterality: N/A;   SINUS SURGERY WITH INSTATRAK     x 2   TUBAL LIGATION     UPPER GASTROINTESTINAL ENDOSCOPY     Vaginal cystectomy     x 2   VAGINAL HYSTERECTOMY  05/2007   Vaginal repair, Dr Nicholas Lose.  Partial  hysterectomy.   WRIST ARTHROSCOPY      FAMILY HISTORY: Family History  Problem Relation Age of Onset   Hypertension Father 27   Stroke Father 29   Heart attack Father         ? in 10s   Hypertension Mother    Neuropathy Mother    Anemia Mother    Coronary artery disease Brother        Stent placement in 39s   Diabetes Maternal Uncle    Heart attack Paternal Uncle        SEVEN , ? age   Colon cancer Neg Hx    Seizures Neg Hx    Esophageal cancer Neg Hx    Rectal cancer Neg Hx    Stomach cancer Neg Hx     SOCIAL HISTORY: Social History   Socioeconomic History   Marital status: Married    Spouse name: Not on file   Number of children: 2   Years of education: 18   Highest education level: Not on file  Occupational History   Occupation: retired    Associate Professor: [employer]  Tobacco Use   Smoking status:  Former    Current packs/day: 0.00    Average packs/day: 0.5  packs/day for 15.0 years (7.5 ttl pk-yrs)    Types: Cigarettes    Start date: 03/04/1983    Quit date: 03/03/1998    Years since quitting: 24.9   Smokeless tobacco: Never   Tobacco comments:    smoked 1973- 2006, up to <  1  ppd  Vaping Use   Vaping status: Never Used  Substance and Sexual Activity   Alcohol use: No    Alcohol/week: 0.0 standard drinks of alcohol   Drug use: No   Sexual activity: Yes    Partners: Male    Comment: 1st intercourse- 52, partners- 2, married- 22 yrs   Other Topics Concern   Not on file  Social History Narrative   HAS REGULAR EXERCISE   DAILY CAFFEINE: 1-2 CUPS   Patient is right handed.   Social Determinants of Health   Financial Resource Strain: Not on file  Food Insecurity: Low Risk  (10/13/2022)   Received from [facility]   Hunger Vital Sign    Worried About Running Out of Food in the Last Year: Never true    Ran Out of Food in the Last Year: Never true  Transportation Needs: Not on file (10/13/2022)  Physical Activity: Not on file  Stress: Not on file  Social Connections: Not on file  Intimate Partner Violence: Not on file     PHYSICAL EXAM  GENERAL EXAM/CONSTITUTIONAL: Vitals:  Vitals:   02/04/23 1143  Weight: 132 lb 9.6 oz (60.1 kg)  Height: 5\' 8"  (1.727 m)   Body mass index is 20.16 kg/m. Wt Readings from Last 3 Encounters:  02/04/23 132 lb 9.6 oz (60.1 kg)  02/03/23 135 lb 12.8 oz (61.6 kg)  01/06/23 142 lb (64.4 kg)   Patient is in no distress; well developed, nourished and groomed; neck is supple  CARDIOVASCULAR: Examination of carotid arteries is normal; no carotid bruits Regular rate and rhythm, no murmurs Examination of peripheral vascular system by observation and palpation is normal  EYES: Ophthalmoscopic exam of optic discs and posterior segments is normal; no papilledema or hemorrhages No results found.  MUSCULOSKELETAL: Gait, strength, tone,  movements noted in Neurologic exam below  NEUROLOGIC: MENTAL STATUS:      No data to display         awake, alert, oriented to person, place and time recent and remote memory intact normal attention and concentration language fluent, comprehension intact, naming intact fund of knowledge appropriate  CRANIAL NERVE:  2nd - no papilledema on fundoscopic exam 2nd, 3rd, 4th, 6th - pupils equal and reactive to light, visual fields full to confrontation, extraocular muscles intact, no nystagmus 5th - facial sensation symmetric 7th - facial strength symmetric 8th - hearing intact 9th - palate elevates symmetrically, uvula midline 11th - shoulder shrug symmetric 12th - tongue protrusion midline  MOTOR:  normal bulk and tone, full strength in the BUE, BLE  SENSORY:  normal and symmetric to light touch, pinprick, temperature, vibration; EXCEPT SLIGHTLY DECR IN TOES HAMMERTOES  COORDINATION:  finger-nose-finger, fine finger movements normal  REFLEXES:  deep tendon reflexes TRACE and symmetric  GAIT/STATION:  narrow based gait     DIAGNOSTIC DATA (LABS, IMAGING, TESTING) - I reviewed patient records, labs, notes, testing and imaging myself where available.  Lab Results  Component Value Date   WBC 6.4 11/13/2022   HGB 12.2 11/13/2022   HCT 35.7 (L) 11/13/2022   MCV 88.4 11/13/2022   PLT 246 11/13/2022      Component Value Date/Time  NA 139 12/24/2022 1245   K 4.6 12/24/2022 1245   CL 100 12/24/2022 1245   CO2 25 12/24/2022 1245   GLUCOSE 82 12/24/2022 1245   GLUCOSE 92 11/20/2022 1249   BUN 17 12/24/2022 1245   CREATININE 0.51 (L) 12/24/2022 1245   CALCIUM 9.4 12/24/2022 1245   PROT 5.5 (L) 11/13/2022 1140   PROT 6.5 12/11/2021 1217   ALBUMIN 3.3 (L) 11/13/2022 1140   ALBUMIN 4.3 04/20/2012 1018   AST 24 11/13/2022 1140   ALT 17 11/13/2022 1140   ALKPHOS 38 11/13/2022 1140   BILITOT 0.5 11/13/2022 1140   GFRNONAA >60 11/13/2022 1140   GFRAA >90  05/30/2014 0637   Lab Results  Component Value Date   CHOL 131 06/12/2022   HDL 56.80 06/12/2022   LDLCALC 59 06/12/2022   TRIG 76.0 06/12/2022   CHOLHDL 2 06/12/2022   Lab Results  Component Value Date   HGBA1C 6.0 06/12/2022   Lab Results  Component Value Date   VITAMINB12 1,244 (H) 01/21/2022   Lab Results  Component Value Date   TSH 2.69 06/12/2022    12/11/21 NEUROPATHY LABS - ANA, SPEP, ACE, ANCA --> negative   ASSESSMENT AND PLAN  77 y.o. year old female here with:   Dx:  1. Neuropathy     PLAN:  FOOT PAIN / NUMBNESS --> NEUROPATHY (idiopathic / hereditary; mother also had neuropathy ) - prior workup per Dr. Anne Hahn and myself negative - consider painful neuropathy treatment options vs pain mgmt clinic referral (apparently has tried and failed gabapentin, Lyrica and Cymbalta many years ago) -duloxetine 30-60mg  daily, amitriptyline 25-50mg  at bedtime, gabapentin 100-300mg  three times a day, pregabalin 75-150mg  twice a day -capsaicin cream, voltaren gel, lidocaine patch / cream, alpha-lipoic acid 600mg  daily - consider referral to podiatry or pain mgmt for symptom control  Return for return to PCP.    Suanne Marker, MD 02/04/2023, 11:44 AM Certified in Neurology, Neurophysiology and Neuroimaging  [facility] [address] [place], [place] [address] [phone]

## 2023-02-04 NOTE — Telephone Encounter (Signed)
Dr. Elvera Lennox, patient will be scheduled as soon as possible.  Auth Submission: NO AUTH NEEDED Site of care: Site of care: [facility] Payer: [employer] Medicare Medication & CPT/J Code(s) submitted: Prolia (Denosumab) [id number] Route of submission (phone, fax, portal): portal Phone # Fax # Auth type: Buy/Bill PB Units/visits requested: 60mg  x 1 dose Reference number: [id number] Approval from: 02/04/23 to 03/03/23    I confirmed on the [facility] portal that Prolia does not need an authorization for this patient.

## 2023-02-05 ENCOUNTER — Encounter: Payer: Self-pay | Admitting: Internal Medicine

## 2023-02-05 NOTE — Patient Instructions (Addendum)
Blood work was ordered.       Medications changes include :   None     Return in about 6 months (around 08/07/2023) for follow up.    Health Maintenance, Female Adopting a healthy lifestyle and getting preventive care are important in promoting health and wellness. Ask your health care provider about: The right schedule for you to have regular tests and exams. Things you can do on your own to prevent diseases and keep yourself healthy. What should I know about diet, weight, and exercise? Eat a healthy diet  Eat a diet that includes plenty of vegetables, fruits, low-fat dairy products, and lean protein. Do not eat a lot of foods that are high in solid fats, added sugars, or sodium. Maintain a healthy weight Body mass index (BMI) is used to identify weight problems. It estimates body fat based on height and weight. Your health care provider can help determine your BMI and help you achieve or maintain a healthy weight. Get regular exercise Get regular exercise. This is one of the most important things you can do for your health. Most adults should: Exercise for at least 150 minutes each week. The exercise should increase your heart rate and make you sweat (moderate-intensity exercise). Do strengthening exercises at least twice a week. This is in addition to the moderate-intensity exercise. Spend less time sitting. Even light physical activity can be beneficial. Watch cholesterol and blood lipids Have your blood tested for lipids and cholesterol at 79 years of age, then have this test every 5 years. Have your cholesterol levels checked more often if: Your lipid or cholesterol levels are high. You are older than 79 years of age. You are at high risk for heart disease. What should I know about cancer screening? Depending on your health history and family history, you may need to have cancer screening at various ages. This may include screening for: Breast cancer. Cervical  cancer. Colorectal cancer. Skin cancer. Lung cancer. What should I know about heart disease, diabetes, and high blood pressure? Blood pressure and heart disease High blood pressure causes heart disease and increases the risk of stroke. This is more likely to develop in people who have high blood pressure readings or are overweight. Have your blood pressure checked: Every 3-5 years if you are 2-53 years of age. Every year if you are 64 years old or older. Diabetes Have regular diabetes screenings. This checks your fasting blood sugar level. Have the screening done: Once every three years after age 2 if you are at a normal weight and have a low risk for diabetes. More often and at a younger age if you are overweight or have a high risk for diabetes. What should I know about preventing infection? Hepatitis B If you have a higher risk for hepatitis B, you should be screened for this virus. Talk with your health care provider to find out if you are at risk for hepatitis B infection. Hepatitis C Testing is recommended for: Everyone born from [birth year] through 1967. Anyone with known risk factors for hepatitis C. Sexually transmitted infections (STIs) Get screened for STIs, including gonorrhea and chlamydia, if: You are sexually active and are younger than 79 years of age. You are older than 79 years of age and your health care provider tells you that you are at risk for this type of infection. Your sexual activity has changed since you were last screened, and you are at increased risk for chlamydia or  gonorrhea. Ask your health care provider if you are at risk. Ask your health care provider about whether you are at high risk for HIV. Your health care provider may recommend a prescription medicine to help prevent HIV infection. If you choose to take medicine to prevent HIV, you should first get tested for HIV. You should then be tested every 3 months for as long as you are taking the  medicine. Pregnancy If you are about to stop having your period (premenopausal) and you may become pregnant, seek counseling before you get pregnant. Take 400 to 800 micrograms (mcg) of folic acid every day if you become pregnant. Ask for birth control (contraception) if you want to prevent pregnancy. Osteoporosis and menopause Osteoporosis is a disease in which the bones lose minerals and strength with aging. This can result in bone fractures. If you are 89 years old or older, or if you are at risk for osteoporosis and fractures, ask your health care provider if you should: Be screened for bone loss. Take a calcium or vitamin D supplement to lower your risk of fractures. Be given hormone replacement therapy (HRT) to treat symptoms of menopause. Follow these instructions at home: Alcohol use Do not drink alcohol if: Your health care provider tells you not to drink. You are pregnant, may be pregnant, or are planning to become pregnant. If you drink alcohol: Limit how much you have to: 0-1 drink a day. Know how much alcohol is in your drink. In the [place] one drink equals one 12 oz bottle of beer (355 mL), one 5 oz glass of wine (148 mL), or one 1 oz glass of hard liquor (44 mL). Lifestyle Do not use any products that contain nicotine or tobacco. These products include cigarettes, chewing tobacco, and vaping devices, such as e-cigarettes. If you need help quitting, ask your health care provider. Do not use street drugs. Do not share needles. Ask your health care provider for help if you need support or information about quitting drugs. General instructions Schedule regular health, dental, and eye exams. Stay current with your vaccines. Tell your health care provider if: You often feel depressed. You have ever been abused or do not feel safe at home. Summary Adopting a healthy lifestyle and getting preventive care are important in promoting health and wellness. Follow your health care  provider's instructions about healthy diet, exercising, and getting tested or screened for diseases. Follow your health care provider's instructions on monitoring your cholesterol and blood pressure. This information is not intended to replace advice given to you by your health care provider. Make sure you discuss any questions you have with your health care provider. Document Revised: 07/09/2020 Document Reviewed: 07/09/2020 Elsevier Patient Education  2024 [employer].

## 2023-02-05 NOTE — Progress Notes (Signed)
Subjective:    Patient ID: [name], female    DOB: 09-Nov-1945, 77 y.o.   MRN: [id number]      HPI [name] is here for a Physical exam and her chronic medical problems.   Will be starting prolia.    BP at home 90/60-128/77 - taking midodrine as needed.  She only takes it if her BP is less 100   Medications and allergies reviewed with patient and updated if appropriate.  Current Outpatient Medications on File Prior to Visit  Medication Sig Dispense Refill   Budeson-Glycopyrrol-Formoterol (BREZTRI AEROSPHERE) 160-9-4.8 MCG/ACT AERO Inhale 2 puffs into the lungs 2 (two) times daily. 10.7 g 11   Cyanocobalamin (VITAMIN B-12 PO) Take 1 tablet by mouth daily.     famotidine (PEPCID) 40 MG tablet Take 1 tablet (40 mg total) by mouth daily. For heartburn 90 tablet 1   hydrOXYzine (ATARAX) 25 MG tablet Take 25 mg by mouth daily as needed.     levalbuterol (XOPENEX HFA) 45 MCG/ACT inhaler Inhale 1-2 puffs into the lungs every 4 (four) hours as needed for wheezing. 1 each 12   OVER THE COUNTER MEDICATION Bone Up 1000 mg 2 tabs TID     OVER THE COUNTER MEDICATION B-Right (B-Complex) 1 po daily     Probiotic Product (ALIGN) 4 MG CAPS Take 1 capsule by mouth as needed.     RESTASIS 0.05 % ophthalmic emulsion 1 drop 2 (two) times daily.     traZODone (DESYREL) 50 MG tablet Take 50 mg by mouth at bedtime.     vitamin C (ASCORBIC ACID) 500 MG tablet Take 500 mg by mouth daily.     vitamin E 180 MG (400 UNITS) capsule Take 400 Units by mouth daily.     [DISCONTINUED] Hyoscyamine-Phenyltoloxamine (DIGEX NF) [id number] MG CAPS Take one tablet by mouth as needed for abdominal cramping 24 each 0   No current facility-administered medications on file prior to visit.    Review of Systems  Constitutional:  Negative for fever.  Eyes:  Negative for visual disturbance.  Respiratory:  Positive for shortness of breath (occ). Negative for cough and wheezing.   Cardiovascular:   Positive for chest pain (occ) and palpitations (occ). Negative for leg swelling.  Gastrointestinal:  Positive for abdominal pain (occ). Negative for blood in stool, constipation and diarrhea.       No gerd  Genitourinary:  Negative for dysuria.  Musculoskeletal:  Positive for arthralgias and back pain.  Skin:  Negative for rash.       New lesion on upper back  Neurological:  Positive for light-headedness and headaches (occ).  Psychiatric/Behavioral:  Positive for dysphoric mood. The patient is not nervous/anxious.        Objective:   Vitals:   02/06/23 1306  BP: 102/68  Pulse: 68  Temp: 98 F (36.7 C)  SpO2: 96%   Filed Weights   02/06/23 1306  Weight: 132 lb (59.9 kg)   Body mass index is 20.07 kg/m.  BP Readings from Last 3 Encounters:  02/06/23 102/68  02/04/23 116/75  02/03/23 118/78    Wt Readings from Last 3 Encounters:  02/06/23 132 lb (59.9 kg)  02/04/23 132 lb 9.6 oz (60.1 kg)  02/03/23 135 lb 12.8 oz (61.6 kg)       Physical Exam Constitutional: She appears well-developed and well-nourished. No distress.  HENT:  Head: Normocephalic and atraumatic.  Right Ear: External ear normal. Normal ear canal and TM Left Ear: External  ear normal.  Normal ear canal and TM Mouth/Throat: Oropharynx is clear and moist.  Eyes: Conjunctivae normal.  Neck: Neck supple. No tracheal deviation present. No thyromegaly present.  No carotid bruit  Cardiovascular: Normal rate, regular rhythm and normal heart sounds.   No murmur heard.  No edema. Pulmonary/Chest: Effort normal and breath sounds normal. No respiratory distress. She has no wheezes. She has no rales.  Breast: deferred   Abdominal: Soft. She exhibits no distension. There is no tenderness.  Lymphadenopathy: She has no cervical adenopathy.  Skin: Skin is warm and dry. She is not diaphoretic.  Psychiatric: She has a normal mood and affect. Her behavior is normal.     Lab Results  Component Value Date   WBC  8.4 02/06/2023   HGB 14.9 02/06/2023   HCT 42.8 02/06/2023   PLT 244.0 02/06/2023   GLUCOSE 100 (H) 02/06/2023   CHOL 242 (H) 02/06/2023   TRIG 108.0 02/06/2023   HDL 69.10 02/06/2023   LDLCALC 151 (H) 02/06/2023   ALT 12 02/06/2023   AST 19 02/06/2023   NA 140 02/06/2023   K 4.1 02/06/2023   CL 101 02/06/2023   CREATININE 0.65 02/06/2023   BUN 15 02/06/2023   CO2 31 02/06/2023   TSH 2.69 06/12/2022   INR 1.08 01/09/2009   HGBA1C 6.1 02/06/2023   MICROALBUR 2.5 (H) 01/06/2011         Assessment & Plan:   Physical exam: Screening blood work  ordered Exercise  not exercising regularly - stressed regular exercise Weight  normal  Substance abuse  none   Reviewed recommended immunizations.   Health Maintenance  Topic Date Due   COVID-19 Vaccine (3 - 2023-24 season) 02/22/2023 (Originally 11/02/2022)   DEXA SCAN  10/09/2023   DTaP/Tdap/Td (2 - Td or Tdap) 12/21/2028   Pneumonia Vaccine 65+ Years old  Completed   INFLUENZA VACCINE  Completed   Hepatitis C Screening  Completed   Zoster Vaccines- Shingrix  Completed   HPV VACCINES  Aged Out   Colonoscopy  Discontinued          See Problem List for Assessment and Plan of chronic medical problems.

## 2023-02-06 ENCOUNTER — Ambulatory Visit (INDEPENDENT_AMBULATORY_CARE_PROVIDER_SITE_OTHER): Payer: 59 | Admitting: Internal Medicine

## 2023-02-06 VITALS — BP 102/68 | HR 68 | Temp 98.0°F | Ht 68.0 in | Wt 132.0 lb

## 2023-02-06 DIAGNOSIS — Z0000 Encounter for general adult medical examination without abnormal findings: Secondary | ICD-10-CM

## 2023-02-06 DIAGNOSIS — I700 Atherosclerosis of aorta: Secondary | ICD-10-CM | POA: Diagnosis not present

## 2023-02-06 DIAGNOSIS — M810 Age-related osteoporosis without current pathological fracture: Secondary | ICD-10-CM | POA: Diagnosis not present

## 2023-02-06 DIAGNOSIS — F3289 Other specified depressive episodes: Secondary | ICD-10-CM

## 2023-02-06 DIAGNOSIS — R7303 Prediabetes: Secondary | ICD-10-CM | POA: Diagnosis not present

## 2023-02-06 DIAGNOSIS — E782 Mixed hyperlipidemia: Secondary | ICD-10-CM

## 2023-02-06 DIAGNOSIS — E559 Vitamin D deficiency, unspecified: Secondary | ICD-10-CM

## 2023-02-06 DIAGNOSIS — F5104 Psychophysiologic insomnia: Secondary | ICD-10-CM

## 2023-02-06 DIAGNOSIS — E538 Deficiency of other specified B group vitamins: Secondary | ICD-10-CM

## 2023-02-06 DIAGNOSIS — K219 Gastro-esophageal reflux disease without esophagitis: Secondary | ICD-10-CM | POA: Diagnosis not present

## 2023-02-06 DIAGNOSIS — I952 Hypotension due to drugs: Secondary | ICD-10-CM

## 2023-02-06 LAB — LIPID PANEL
Cholesterol: 242 mg/dL — ABNORMAL HIGH (ref 0–200)
HDL: 69.1 mg/dL (ref >39.00)
LDL Cholesterol: 151 mg/dL — ABNORMAL HIGH (ref 0–99)
NonHDL: 172.48
Total CHOL/HDL Ratio: 3
Triglycerides: 108 mg/dL (ref 0.0–149.0)
VLDL: 21.6 mg/dL (ref 0.0–40.0)

## 2023-02-06 LAB — CBC WITH DIFFERENTIAL/PLATELET
Basophils Absolute: 0.1 10*3/uL (ref 0.0–0.1)
Basophils Relative: 1.3 % (ref 0.0–3.0)
Eosinophils Absolute: 0.1 10*3/uL (ref 0.0–0.7)
Eosinophils Relative: 1 % (ref 0.0–5.0)
HCT: 42.8 % (ref 36.0–46.0)
Hemoglobin: 14.9 g/dL (ref 12.0–15.0)
Lymphocytes Relative: 21.9 % (ref 12.0–46.0)
Lymphs Abs: 1.8 10*3/uL (ref 0.7–4.0)
MCHC: 34.9 g/dL (ref 30.0–36.0)
MCV: 91 fL (ref 78.0–100.0)
Monocytes Absolute: 0.5 10*3/uL (ref 0.1–1.0)
Monocytes Relative: 6.1 % (ref 3.0–12.0)
Neutro Abs: 5.9 10*3/uL (ref 1.4–7.7)
Neutrophils Relative %: 69.7 % (ref 43.0–77.0)
Platelets: 244 10*3/uL (ref 150.0–400.0)
RBC: 4.7 Mil/uL (ref 3.87–5.11)
RDW: 14.2 % (ref 11.5–15.5)
WBC: 8.4 10*3/uL (ref 4.0–10.5)

## 2023-02-06 LAB — COMPREHENSIVE METABOLIC PANEL
ALT: 12 U/L (ref 0–35)
AST: 19 U/L (ref 0–37)
Albumin: 4.6 g/dL (ref 3.5–5.2)
Alkaline Phosphatase: 66 U/L (ref 39–117)
BUN: 15 mg/dL (ref 6–23)
CO2: 31 meq/L (ref 19–32)
Calcium: 9.9 mg/dL (ref 8.4–10.5)
Chloride: 101 meq/L (ref 96–112)
Creatinine, Ser: 0.65 mg/dL (ref 0.40–1.20)
GFR: 84.01 mL/min (ref >60.00)
Glucose, Bld: 100 mg/dL — ABNORMAL HIGH (ref 70–99)
Potassium: 4.1 meq/L (ref 3.5–5.1)
Sodium: 140 meq/L (ref 135–145)
Total Bilirubin: 0.5 mg/dL (ref 0.2–1.2)
Total Protein: 7.2 g/dL (ref 6.0–8.3)

## 2023-02-06 LAB — VITAMIN B12: Vitamin B-12: 419 pg/mL (ref 211–911)

## 2023-02-06 LAB — HEMOGLOBIN A1C: Hgb A1c MFr Bld: 6.1 % (ref 4.6–6.5)

## 2023-02-06 MED ORDER — MIDODRINE HCL 10 MG PO TABS: 10.0000 mg | ORAL_TABLET | Freq: Two times a day (BID) | ORAL | 2 refills | Status: DC

## 2023-02-06 NOTE — Assessment & Plan Note (Addendum)
Chronic Check lipid panel  Not taking crestor-concerned this is the cause or will make her osteoporosis worse Coronary artery calcium score is less than 100 so we will recheck lipids today and she will work more on lifestyle Regular exercise and healthy diet encouraged

## 2023-02-06 NOTE — Assessment & Plan Note (Signed)
Chronic Following with Dr Elvera Lennox Will start prolia

## 2023-02-06 NOTE — Assessment & Plan Note (Signed)
Chronic Vitamin D level is good Continue current vitamin D supplementation

## 2023-02-06 NOTE — Assessment & Plan Note (Addendum)
Chronic Taking midodrine as needed  -- advised to take twice daily consistently and we can adjust the medication dose as needed - may need 5 mg Discussed I would like to make her blood pressure little bit more stable and less variable BP at home 90/60-128/77 - rare SBP 88 or 140 Continue to monitor BP at home

## 2023-02-06 NOTE — Assessment & Plan Note (Signed)
Chronic Management per psychiatry Not currently on any antidepressant except for trazodone for sleep Has some anxiety and depression

## 2023-02-06 NOTE — Assessment & Plan Note (Addendum)
Seen on CT scan Reviewed that she has coronary artery atherosclerosis and aortic atherosclerosis No longer on statin-concerned about the risk of making her osteoporosis worse Coronary artery calcium score is low so we will hold off on statin and monitor cholesterol Stressed regular exercise, healthy diet Check lipid panel

## 2023-02-06 NOTE — Assessment & Plan Note (Signed)
Chronic GERD controlled Continue famotidine 40 mg daily

## 2023-02-06 NOTE — Assessment & Plan Note (Signed)
Chronic Has tried several medications over the past several months that have been ineffective or she has had side effects from including trazodone, Rozerem, Remeron, Seroquel, Lunesta  Management per psychiatry On trazodone, hydroxyzine

## 2023-02-06 NOTE — Assessment & Plan Note (Signed)
Chronic Taking vitamin B12 Check B12 level

## 2023-02-06 NOTE — Assessment & Plan Note (Signed)
Chronic Check a1c Low sugar / carb diet Stressed regular exercise  

## 2023-03-03 ENCOUNTER — Ambulatory Visit: Payer: 59 | Admitting: *Deleted

## 2023-03-03 VITALS — BP 102/53 | HR 59 | Temp 97.9°F | Resp 16 | Ht 68.0 in | Wt 134.8 lb

## 2023-03-03 DIAGNOSIS — M810 Age-related osteoporosis without current pathological fracture: Secondary | ICD-10-CM | POA: Diagnosis not present

## 2023-03-03 MED ORDER — DENOSUMAB 60 MG/ML ~~LOC~~ SOSY
60.0000 mg | PREFILLED_SYRINGE | Freq: Once | SUBCUTANEOUS | Status: AC
  Administered 2023-03-03: 60 mg via SUBCUTANEOUS
  Filled 2023-03-03: qty 1

## 2023-03-03 NOTE — Patient Instructions (Signed)
 Denosumab Injection (Osteoporosis) What is this medication? DENOSUMAB (den oh SUE mab) prevents and treats osteoporosis. It works by Interior and spatial designer stronger and less likely to break (fracture). It is a monoclonal antibody. This medicine may be used for other purposes; ask your health care provider or pharmacist if you have questions. COMMON BRAND NAME(S): Prolia What should I tell my care team before I take this medication? They need to know if you have any of these conditions: Dental or gum disease Had thyroid or parathyroid (glands located in neck) surgery Having dental surgery or a tooth pulled Kidney disease Low levels of calcium in the blood On dialysis Poor nutrition Thyroid disease Trouble absorbing nutrients from your food An unusual or allergic reaction to denosumab, other medications, foods, dyes, or preservatives Pregnant or trying to get pregnant Breastfeeding How should I use this medication? This medication is injected under the skin. It is given by your care team in a hospital or clinic setting. A special MedGuide will be given to you before each treatment. Be sure to read this information carefully each time. Talk to your care team about the use of this medication in children. Special care may be needed. Overdosage: If you think you have taken too much of this medicine contact a poison control center or emergency room at once. NOTE: This medicine is only for you. Do not share this medicine with others. What if I miss a dose? Keep appointments for follow-up doses. It is important not to miss your dose. Call your care team if you are unable to keep an appointment. What may interact with this medication? Do not take this medication with any of the following: Other medications that contain denosumab This medication may also interact with the following: Medications that lower your chance of fighting infection Steroid medications, such as prednisone or cortisone This  list may not describe all possible interactions. Give your health care provider a list of all the medicines, herbs, non-prescription drugs, or dietary supplements you use. Also tell them if you smoke, drink alcohol, or use illegal drugs. Some items may interact with your medicine. What should I watch for while using this medication? Your condition will be monitored carefully while you are receiving this medication. You may need blood work done while taking this medication. This medication may increase your risk of getting an infection. Call your care team for advice if you get a fever, chills, sore throat, or other symptoms of a cold or flu. Do not treat yourself. Try to avoid being around people who are sick. Tell your dentist and dental surgeon that you are taking this medication. You should not have major dental surgery while on this medication. See your dentist to have a dental exam and fix any dental problems before starting this medication. Take good care of your teeth while on this medication. Make sure you see your dentist for regular follow-up appointments. This medication may cause low levels of calcium in your body. The risk of severe side effects is increased in people with kidney disease. Your care team may prescribe calcium and vitamin D to help prevent low calcium levels while you take this medication. It is important to take calcium and vitamin D as directed by your care team. Talk to your care team if you may be pregnant. Serious birth defects may occur if you take this medication during pregnancy and for 5 months after the last dose. You will need a negative pregnancy test before starting this medication. Contraception  is recommended while taking this medication and for 5 months after the last dose. Your care team can help you find the option that works for you. Talk to your care team before breastfeeding. Changes to your treatment plan may be needed. What side effects may I notice from  receiving this medication? Side effects that you should report to your care team as soon as possible: Allergic reactions--skin rash, itching, hives, swelling of the face, lips, tongue, or throat Infection--fever, chills, cough, sore throat, wounds that don't heal, pain or trouble when passing urine, general feeling of discomfort or being unwell Low calcium level--muscle pain or cramps, confusion, tingling, or numbness in the hands or feet Osteonecrosis of the jaw--pain, swelling, or redness in the mouth, numbness of the jaw, poor healing after dental work, unusual discharge from the mouth, visible bones in the mouth Severe bone, joint, or muscle pain Skin infection--skin redness, swelling, warmth, or pain Side effects that usually do not require medical attention (report these to your care team if they continue or are bothersome): Back pain Headache Joint pain Muscle pain Pain in the hands, arms, legs, or feet Runny or stuffy nose Sore throat This list may not describe all possible side effects. Call your doctor for medical advice about side effects. You may report side effects to FDA at 1-800-FDA-1088. Where should I keep my medication? This medication is given in a hospital or clinic. It will not be stored at home. NOTE: This sheet is a summary. It may not cover all possible information. If you have questions about this medicine, talk to your doctor, pharmacist, or health care provider.  2024 Elsevier/Gold Standard (2022-03-25 00:00:00)

## 2023-03-03 NOTE — Progress Notes (Signed)
 Diagnosis: Osteoporosis  Provider:  Chilton Greathouse MD  Procedure: Injection  Prolia (Denosumab), Dose: 60 mg, Site: subcutaneous, Number of injections: 1  Injection Site(s): Left arm  Post Care: Observation period completed  Discharge: Condition: Good, Destination: Home . AVS Provided  Performed by:  Forrest Moron, RN

## 2023-03-13 ENCOUNTER — Telehealth: Payer: Self-pay | Admitting: Internal Medicine

## 2023-03-13 NOTE — Telephone Encounter (Signed)
 Copied from CRM [id number]. Topic: Clinical - Lab/Test Results >> Mar 13, 2023 10:45 AM Shelbie Proctor wrote: Reason for CRM: Patient [phone] would like to tests results, please call back.

## 2023-03-17 NOTE — Telephone Encounter (Signed)
 Copied from CRM [id number]. Topic: Clinical - Lab/Test Results >> Mar 17, 2023  4:01 PM Sim Boast F wrote: Patient called again to discuss lab results

## 2023-03-18 NOTE — Telephone Encounter (Signed)
 Her most recent vitamin B12 level was just over 400 which is very good therefore B12 injections is not going to make a difference or improve her fatigue.  She can continue her oral supplementation.  I found this in a [place]  times article from 2019-  Osteoporosis was more than three times as common in people who took statins. But taking 10 milligrams a day or less was associated with as much as a 60 percent reduced risk of osteoporosis, depending on the type of statin.   Different studies may show different things so it is hard to know how much of an effect it has on the bones.  The one good thing is that we know she has no plaque buildup in her heart arteries so she is extremely low risk of having a heart attack so not taking a statin is not as important.  She could work a little harder with diet and exercise and see if that helps get the LDL down a little.

## 2023-03-24 NOTE — Telephone Encounter (Signed)
Spoke with patient, gave a verbal understanding °

## 2023-03-27 ENCOUNTER — Ambulatory Visit: Payer: 59 | Admitting: Primary Care

## 2023-03-27 ENCOUNTER — Ambulatory Visit: Payer: Self-pay | Admitting: Internal Medicine

## 2023-03-27 VITALS — BP 102/70 | HR 66 | Temp 98.2°F | Ht 68.0 in | Wt 135.0 lb

## 2023-03-27 DIAGNOSIS — R059 Cough, unspecified: Secondary | ICD-10-CM | POA: Insufficient documentation

## 2023-03-27 DIAGNOSIS — R051 Acute cough: Secondary | ICD-10-CM | POA: Diagnosis not present

## 2023-03-27 MED ORDER — AMOXICILLIN-POT CLAVULANATE 875-125 MG PO TABS: 1.0000 | ORAL_TABLET | Freq: Two times a day (BID) | ORAL | 0 refills | Status: DC

## 2023-03-27 MED ORDER — PREDNISONE 20 MG PO TABS: ORAL_TABLET | ORAL | 0 refills | Status: DC

## 2023-03-27 NOTE — Progress Notes (Signed)
Subjective:    Patient ID: [name], female    DOB: Apr 18, 1945, 78 y.o.   MRN: [id number]  HPI  [name] is a very pleasant 78 y.o. female patient of Dr. Lawerance Bach with a history of diaphragmatic hernia, chronic rhinitis, OSA, seasonal allergies, asthma, prediabetes who presents today to discuss URI symptoms.  Symptom onset 1 week ago with a sore throat with swollen glands. She then developed a productive cough with yellow sputum - thick chunks, left ear pain, chest tightness, shortness of breath. Today she's feeling a lot worse as she can feel chest congestion.   She denies fevers, but she has noticed chills. She denies wheezing and has not used either her Breztri or albuterol inhalers.  She's been taking Mucinex pills and Mucinex Cold and & Flu.   Today she's feeling a lot worse.    Review of Systems  Constitutional:  Positive for chills and fatigue. Negative for fever.  HENT:  Positive for congestion, postnasal drip and sore throat.   Respiratory:  Positive for chest tightness and shortness of breath.   Cardiovascular:  Negative for chest pain.         Past Medical History:  Diagnosis Date   Allergy    Anemia    Anxiety    Asthma    Cataract    bil cateracts removed   Complication of anesthesia    Depression    generalized anxiety disorder   Diverticulosis    Duodenal diverticulum    Family history of adverse reaction to anesthesia    Mother and Daughters- N/V   Fibromyalgia    Gastroesophageal reflux disease with hiatal hernia    Hiatal hernia    History of kidney stones    Hyperlipidemia    IBS (irritable bowel syndrome)    Lymphocytic colitis    Dr Jarold Motto   Migraine headache    Mitral valve prolapse    Neuropathy    Osteopenia    BMD ordered by GYN   Osteoporosis    Peripheral neuropathy    treated as RLS by  Neurology   PONV (postoperative nausea and vomiting)    Restless leg syndrome    Schatzki's ring     History of   Sleep apnea    On CPAP, has not been using   Spondylosis     Social History   Socioeconomic History   Marital status: Married    Spouse name: Not on file   Number of children: 2   Years of education: 18   Highest education level: Not on file  Occupational History   Occupation: retired    Associate Professor: [employer]  Tobacco Use   Smoking status: Former    Current packs/day: 0.00    Average packs/day: 0.5 packs/day for 15.0 years (7.5 ttl pk-yrs)    Types: Cigarettes    Start date: 03/04/1983    Quit date: 03/03/1998    Years since quitting: 25.0   Smokeless tobacco: Never   Tobacco comments:    smoked 1973- 2006, up to <  1  ppd  Vaping Use   Vaping status: Never Used  Substance and Sexual Activity   Alcohol use: No    Alcohol/week: 0.0 standard drinks of alcohol   Drug use: No   Sexual activity: Yes    Partners: Male    Comment: 1st intercourse- 67, partners- 2, married- 22 yrs   Other Topics Concern   Not on file  Social History Narrative  HAS REGULAR EXERCISE   DAILY CAFFEINE: 1-2 CUPS   Patient is right handed.   Social Drivers of Corporate investment banker Strain: Not on file  Food Insecurity: Low Risk  (10/13/2022)   Received from [facility]   Hunger Vital Sign    Worried About Running Out of Food in the Last Year: Never true    Ran Out of Food in the Last Year: Never true  Transportation Needs: Not on file (10/13/2022)  Physical Activity: Not on file  Stress: Not on file  Social Connections: Not on file  Intimate Partner Violence: Not on file    Past Surgical History:  Procedure Laterality Date   ANAL RECTAL MANOMETRY N/A 11/14/2015   Procedure: ANO RECTAL MANOMETRY;  Surgeon: Iva Boop, MD;  Location: [facility];  Service: Endoscopy;  Laterality: N/A;   BREAST ENHANCEMENT SURGERY     CATARACT EXTRACTION Bilateral    cataract surgery Left    Dr Hazle Quant   CHOLECYSTECTOMY     COLONOSCOPY     ESOPHAGEAL DILATION     X 2   ROTATOR CUFF  REPAIR Left    SINUS ENDO W/FUSION N/A 05/30/2014   Procedure: REVISION  FRONTAL SINUS SURGERY WITH FUSION SCAN;  Surgeon: Osborn Coho, MD;  Location: [facility] OR;  Service: ENT;  Laterality: N/A;   SINUS SURGERY WITH INSTATRAK     x 2   TUBAL LIGATION     UPPER GASTROINTESTINAL ENDOSCOPY     Vaginal cystectomy     x 2   VAGINAL HYSTERECTOMY  05/2007   Vaginal repair, Dr Nicholas Lose.  Partial  hysterectomy.   WRIST ARTHROSCOPY      Family History  Problem Relation Age of Onset   Hypertension Father 63   Stroke Father 24   Heart attack Father         ? in 8s   Hypertension Mother    Neuropathy Mother    Anemia Mother    Coronary artery disease Brother        Stent placement in 43s   Diabetes Maternal Uncle    Heart attack Paternal Uncle        SEVEN , ? age   Colon cancer Neg Hx    Seizures Neg Hx    Esophageal cancer Neg Hx    Rectal cancer Neg Hx    Stomach cancer Neg Hx     Allergies  Allergen Reactions   Tape Rash   Albuterol     heartburn   Cymbalta [Duloxetine Hcl]     Diarrhea, nausea, anxiety worse, shaky   Latex Other (See Comments)   Lyrica [Pregabalin] Other (See Comments)    Sedation   Neurontin [Gabapentin] Other (See Comments)    Sedation   Nitrofuran Derivatives Other (See Comments)    "tingling"   Paxil [Paroxetine Hcl]     Bowel upset, tingling   Prozac [Fluoxetine Hcl] Other (See Comments)    Made patient worse and constipation    Current Outpatient Medications on File Prior to Visit  Medication Sig Dispense Refill   Budeson-Glycopyrrol-Formoterol (BREZTRI AEROSPHERE) 160-9-4.8 MCG/ACT AERO Inhale 2 puffs into the lungs 2 (two) times daily. 10.7 g 11   Cyanocobalamin (VITAMIN B-12 PO) Take 1 tablet by mouth daily.     FLUoxetine (PROZAC) 10 MG capsule Take 10 mg by mouth daily.     hydrOXYzine (ATARAX) 25 MG tablet Take 25 mg by mouth daily as needed.     levalbuterol [facility] HFA)  45 MCG/ACT inhaler Inhale 1-2 puffs into the lungs every 4 (four)  hours as needed for wheezing. 1 each 12   midodrine (PROAMATINE) 10 MG tablet Take 1 tablet (10 mg total) by mouth 2 (two) times daily. 60 tablet 2   OVER THE COUNTER MEDICATION Bone Up 1000 mg 2 tabs TID     OVER THE COUNTER MEDICATION B-Right (B-Complex) 1 po daily     Probiotic Product (ALIGN) 4 MG CAPS Take 1 capsule by mouth as needed.     RESTASIS 0.05 % ophthalmic emulsion 1 drop 2 (two) times daily.     traZODone (DESYREL) 50 MG tablet Take 50 mg by mouth at bedtime.     vitamin C (ASCORBIC ACID) 500 MG tablet Take 500 mg by mouth daily.     vitamin E 180 MG (400 UNITS) capsule Take 400 Units by mouth daily.     famotidine (PEPCID) 40 MG tablet Take 1 tablet (40 mg total) by mouth daily. For heartburn (Patient not taking: Reported on 03/27/2023) 90 tablet 1   [DISCONTINUED] Hyoscyamine-Phenyltoloxamine (DIGEX NF) 4.1324-40 MG CAPS Take one tablet by mouth as needed for abdominal cramping 24 each 0   No current facility-administered medications on file prior to visit.    BP 102/70 (BP Location: Left Arm, Patient Position: Sitting, Cuff Size: Normal)   Pulse 66   Temp 98.2 F (36.8 C) (Oral)   Ht 5\' 8"  (1.727 m)   Wt 135 lb (61.2 kg)   SpO2 97%   BMI 20.53 kg/m  Objective:   Physical Exam Constitutional:      Appearance: She is ill-appearing.  HENT:     Right Ear: Tympanic membrane and ear canal normal.     Left Ear: Tympanic membrane and ear canal normal.     Nose: No mucosal edema.     Right Sinus: No maxillary sinus tenderness or frontal sinus tenderness.     Left Sinus: No maxillary sinus tenderness or frontal sinus tenderness.     Mouth/Throat:     Mouth: Mucous membranes are moist.  Eyes:     Conjunctiva/sclera: Conjunctivae normal.  Cardiovascular:     Rate and Rhythm: Normal rate and regular rhythm.  Pulmonary:     Effort: Pulmonary effort is normal.     Breath sounds: Examination of the left-upper field reveals rhonchi. Examination of the left-lower field  reveals rhonchi. Rhonchi present. No wheezing.  Musculoskeletal:     Cervical back: Neck supple.  Skin:    General: Skin is warm and dry.           Assessment & Plan:  Acute cough Assessment & Plan: Discussed that symptoms could still be viral; but given presentation, coupled with duration and worsening symptoms, will treat.  Start prednisone tablets. Take two tablets my mouth once daily in the morning for four days, then one tablet once daily in the morning for four days.  Start Augmentin antibiotics. Take 1 tablet by mouth twice daily for 7 days.  Return precautions provided.   Orders: -     Amoxicillin-Pot Clavulanate; Take 1 tablet by mouth 2 (two) times daily.  Dispense: 14 tablet; Refill: 0 -     predniSONE; Take 2 tablets by mouth once daily in the morning for 5 days.  Dispense: 10 tablet; Refill: 0        Doreene Nest, NP

## 2023-03-27 NOTE — Patient Instructions (Addendum)
Start Augmentin antibiotics. Take 1 tablet by mouth twice daily for 7 days.  Start prednisone 20 mg tablets. Take 2 tablets by mouth once daily in the morning for 5 days.  It was a pleasure meeting you!

## 2023-03-27 NOTE — Telephone Encounter (Signed)
Copied from CRM [id number]. Topic: Clinical - Red Word Triage >> Mar 27, 2023 11:13 AM Shelbie Proctor wrote: [facility] that prompted transfer to Nurse Triage: Patient [phone] is feeling awful, coughing yellow phlegm with blood in it, sore throat, head congestion, face and tonsil feels swollen, ears pain worse in left ear, unsure if she has a fever. Patient denies shortness of breath or wheezing.  Chief Complaint: Productive cough Symptoms: Head congestion, ear pain, chills, nasal drainage, sneezing, swollen glands and tonsils Frequency: 6 days Pertinent Negatives: Patient denies relief Disposition: [] ED /[] Urgent Care (no appt availability in office) / [x] Appointment(In office/virtual)/ []  [place] [facility]/ [] Home Care/ [] Refused Recommended Disposition /[] [place] [facility]/ []  Follow-up with PCP Additional Notes: Patient called in to report a productive cough that has been present for 3 days. Patient stated that cough is producing yellow phlegm with blood in it. Patient was unable to give an estimate of the amount of blood, but stated that there was quite a bit. Patient stated she has not been feeling well for about a week, although the cough started more recently. Patient reported head congestion, ear pain, chills, nasal drainage, sneezing, swollen glands and tonsils. Patient stated she experiences SOB when she is moving around a lot. Advised patient to see a provider within 4 hours. No availability with current PCP office. Scheduled patient at [facility] for this afternoon. Advised patient to call back if symptoms worsen or if she has additional questions.   Reason for Disposition  [1] Coughed up blood AND [2] > 1 tablespoon (15 ml)   (Exception: Blood-tinged sputum.)  Answer Assessment - Initial Assessment Questions 1. ONSET: "When did the cough begin?"      States feeling bad for 6 days, productive cough with blood for 3 days  2. SEVERITY: "How bad is the cough today?"       States she is not coughing all the time, but coughs sometimes  3. SPUTUM: "Describe the color of your sputum" (none, dry cough; clear, white, yellow, green)     Yellow phlegm with blood  4. HEMOPTYSIS: "Are you coughing up any blood?" If so ask: "How much?" (flecks, streaks, tablespoons, etc.)     Unable to give an estimate, states there is quite a bit of thick blood  5. DIFFICULTY BREATHING: "Are you having difficulty breathing?" If Yes, ask: "How bad is it?" (e.g., mild, moderate, severe)    - MILD: No SOB at rest, mild SOB with walking, speaks normally in sentences, can lie down, no retractions, pulse < 100.    - MODERATE: SOB at rest, SOB with minimal exertion and prefers to sit, cannot lie down flat, speaks in phrases, mild retractions, audible wheezing, pulse 100-120.    - SEVERE: Very SOB at rest, speaks in single words, struggling to breathe, sitting hunched forward, retractions, pulse > 120      States slight difficulty breathing while being physical and moving around  6. FEVER: "Do you have a fever?" If Yes, ask: "What is your temperature, how was it measured, and when did it start?"     Unsure, taking medications to help with fever  7. CARDIAC HISTORY: "Do you have any history of heart disease?" (e.g., heart attack, congestive heart failure)      History of heart palpitations  8. LUNG HISTORY: "Do you have any history of lung disease?"  (e.g., pulmonary embolus, asthma, emphysema)     COPD  10. OTHER SYMPTOMS: "Do you have any  other symptoms?" (e.g., runny nose, wheezing, chest pain)       Head congestion, ear pain, chills, nasal drainage, sneezing, swollen glands and tonsils  Protocols used: Cough - Acute Productive-A-AH

## 2023-03-27 NOTE — Assessment & Plan Note (Signed)
Discussed that symptoms could still be viral; but given presentation, coupled with duration and worsening symptoms, will treat.  Start prednisone tablets. Take two tablets my mouth once daily in the morning for four days, then one tablet once daily in the morning for four days.  Start Augmentin antibiotics. Take 1 tablet by mouth twice daily for 7 days.  Return precautions provided.

## 2023-04-16 ENCOUNTER — Telehealth: Payer: Self-pay

## 2023-04-16 NOTE — Telephone Encounter (Addendum)
The patient called today concerned that she got an EOB from [facility] and [facility] did not pay for her Prolia injection.  I made phone calls to [facility] billing and [facility].  [employer] stated that the claim was billed incorrectly and they would fix it, the claim is covered 100%.  Reference number for the [facility] call is [id number] Ticket # for [facility] claim is [id number]

## 2023-04-16 NOTE — Telephone Encounter (Signed)
Auth Submission: NO AUTH NEEDED Site of care: Site of care: [facility] Payer: [employer] Commercial Medication & CPT/J Code(s) submitted: Prolia (Denosumab) [id number] Route of submission (phone, fax, portal): portal Phone # Fax # Auth type: Buy/Bill PB Units/visits requested: 60mg  x 2 doses Reference number: [id number] Approval from: 02/04/23 to 03/02/24    I confirmed on the [facility] portal that Prolia does not need an authorization for this patient. I also called [employer] and confirmed that this is covered at 100% for the patient.

## 2023-04-30 ENCOUNTER — Telehealth: Payer: Self-pay | Admitting: Cardiovascular Disease

## 2023-04-30 NOTE — Telephone Encounter (Signed)
 Pt c/o of Chest Pain: STAT if CP now or developed within 24 hours  1. Are you having CP right now? Yes   2. Are you experiencing any other symptoms (ex. SOB, nausea, vomiting, sweating)? Pressure in chest and back   3. How long have you been experiencing CP? Two weeks   4. Is your CP continuous or coming and going? Continuous   5. Have you taken Nitroglycerin? No   Patient called to state BP has been fluctuating and she's been experiencing chest and back pressure for the last two weeks. Also states her BP typically runs low, but over the last week it has been higher.   116/76 122/85 141/80 140/87 139/88 - Most recent  ?

## 2023-04-30 NOTE — Telephone Encounter (Signed)
 The patient called to report that for the past 2-3 weeks, she has experienced intermittent chest pressure. She noted that on 3-4 occasions, the chest pain felt like a squeezing sensation and took her breath away. The patient was unable to identify any precipitating factors but mentioned that the pain resolves with rest. She also stated that she is currently experiencing chest pressure beneath her shoulder blade and into her back.  The patient further reported an increase in her blood pressure over the past 2 weeks. She mentioned that her BP typically runs in the low 100s to 120s. The most recent readings are listed below.  Based on her current symptoms, the patient was advised to go to the ER for further evaluation. The patient verbalized understanding.   2/26 [id number]  2/27 116/76 (AM) 122/85 141/80 140/87 139/88 - Most recent

## 2023-05-01 NOTE — Telephone Encounter (Signed)
 Called patient and notified her of the following from Dr. Mariah Milling.  Prior history of atypical chest pain Perhaps we can set her up to be seen by APP Thx TGollan   Patient verbalizes understanding. Patient scheduled for 05/19/23.
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
## 2023-05-19 ENCOUNTER — Encounter: Payer: Self-pay | Admitting: Cardiovascular Disease

## 2023-05-19 ENCOUNTER — Ambulatory Visit: Payer: 59 | Attending: Cardiovascular Disease | Admitting: Cardiovascular Disease

## 2023-05-19 VITALS — BP 120/70 | HR 59 | Ht 68.0 in | Wt 127.4 lb

## 2023-05-19 DIAGNOSIS — R079 Chest pain, unspecified: Secondary | ICD-10-CM

## 2023-05-19 DIAGNOSIS — E782 Mixed hyperlipidemia: Secondary | ICD-10-CM | POA: Diagnosis not present

## 2023-05-19 DIAGNOSIS — R002 Palpitations: Secondary | ICD-10-CM | POA: Diagnosis not present

## 2023-05-19 DIAGNOSIS — I700 Atherosclerosis of aorta: Secondary | ICD-10-CM

## 2023-05-19 DIAGNOSIS — K551 Chronic vascular disorders of intestine: Secondary | ICD-10-CM

## 2023-05-19 DIAGNOSIS — I951 Orthostatic hypotension: Secondary | ICD-10-CM | POA: Diagnosis not present

## 2023-05-19 DIAGNOSIS — F418 Other specified anxiety disorders: Secondary | ICD-10-CM

## 2023-05-19 DIAGNOSIS — F411 Generalized anxiety disorder: Secondary | ICD-10-CM

## 2023-05-19 DIAGNOSIS — R0602 Shortness of breath: Secondary | ICD-10-CM

## 2023-05-19 DIAGNOSIS — R5382 Chronic fatigue, unspecified: Secondary | ICD-10-CM

## 2023-05-19 NOTE — Patient Instructions (Addendum)
 Referral to vascular (Dr. Dew/Dr. 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 Lafe Garin whether you can restart crestor 5 mg daily Numbers jumped 110 points  Ask whether repatha 140 mg shot every 2 weeks might be better  If you need a refill on your cardiac medications before your next appointment, please call your pharmacy.   Lab work: No new labs needed  Testing/Procedures: No further testing at this time  Follow-Up: At [facility], you and your health needs are our priority.  As part of our continuing mission to provide you with exceptional heart care, we have created designated Provider Care Teams.  These Care Teams include your primary Cardiologist (physician) and Advanced Practice Providers (APPs -  Physician Assistants and Nurse Practitioners) who all work together to provide you with the care you need, when you need it.  You will need a follow up appointment in 12 months  Providers on your designated Care Team:   Nicolasa Ducking, NP Eula Listen, PA-C Cadence Fransico Michael, [place]  COVID-19 Vaccine Information can be found at: [email or web address] For questions related to vaccine distribution or appointments, please email [email or web address] or call [phone].

## 2023-05-25 ENCOUNTER — Ambulatory Visit: Payer: 59 | Admitting: Diagnostic Neuroimaging

## 2023-05-26 ENCOUNTER — Ambulatory Visit: Payer: 59 | Admitting: Cardiovascular Disease

## 2023-06-01 NOTE — Progress Notes (Unsigned)
    Subjective:    Patient ID: [name], female    DOB: 11-06-1945, 78 y.o.   MRN: [id number]      HPI [name] is here for No chief complaint on file.   Vertigo -      Medications and allergies reviewed with patient and updated if appropriate.  Current Outpatient Medications on File Prior to Visit  Medication Sig Dispense Refill   Azelastine-Fluticasone 137-50 MCG/ACT SUSP SMARTSIG:1 Spray(s) Both Nares Every 12 Hours     Budeson-Glycopyrrol-Formoterol (BREZTRI AEROSPHERE) 160-9-4.8 MCG/ACT AERO Inhale 2 puffs into the lungs 2 (two) times daily. 10.7 g 11   Cyanocobalamin (VITAMIN B-12 PO) Take 1 tablet by mouth daily.     famotidine (PEPCID) 40 MG tablet Take 1 tablet (40 mg total) by mouth daily. For heartburn 90 tablet 1   FLUoxetine (PROZAC) 10 MG capsule Take 10 mg by mouth daily. (Patient not taking: Reported on 05/19/2023)     hydrOXYzine (ATARAX) 25 MG tablet Take 25 mg by mouth daily as needed.     levalbuterol (XOPENEX HFA) 45 MCG/ACT inhaler Inhale 1-2 puffs into the lungs every 4 (four) hours as needed for wheezing. 1 each 12   midodrine (PROAMATINE) 10 MG tablet Take 1 tablet (10 mg total) by mouth 2 (two) times daily. 60 tablet 2   OVER THE COUNTER MEDICATION Bone Up 1000 mg 2 tabs TID     OVER THE COUNTER MEDICATION B-Right (B-Complex) 1 po daily     Probiotic Product (ALIGN) 4 MG CAPS Take 1 capsule by mouth as needed.     RESTASIS 0.05 % ophthalmic emulsion 1 drop 2 (two) times daily.     traZODone (DESYREL) 50 MG tablet Take 50 mg by mouth at bedtime.     vitamin C (ASCORBIC ACID) 500 MG tablet Take 500 mg by mouth daily.     vitamin E 180 MG (400 UNITS) capsule Take 400 Units by mouth daily.     [DISCONTINUED] Hyoscyamine-Phenyltoloxamine (DIGEX NF) [id number] MG CAPS Take one tablet by mouth as needed for abdominal cramping 24 each 0   No current facility-administered medications on file prior to visit.    Review of Systems      Objective:  There were no vitals filed for this visit. BP Readings from Last 3 Encounters:  05/19/23 120/70  03/27/23 102/70  03/03/23 (!) 102/53   Wt Readings from Last 3 Encounters:  05/19/23 127 lb 6 oz (57.8 kg)  03/27/23 135 lb (61.2 kg)  03/03/23 134 lb 12.8 oz (61.1 kg)   There is no height or weight on file to calculate BMI.    Physical Exam         Assessment & Plan:    See Problem List for Assessment and Plan of chronic medical problems.

## 2023-06-02 ENCOUNTER — Other Ambulatory Visit

## 2023-06-02 ENCOUNTER — Ambulatory Visit (INDEPENDENT_AMBULATORY_CARE_PROVIDER_SITE_OTHER): Admitting: Internal Medicine

## 2023-06-02 ENCOUNTER — Encounter: Payer: Self-pay | Admitting: Internal Medicine

## 2023-06-02 ENCOUNTER — Ambulatory Visit (INDEPENDENT_AMBULATORY_CARE_PROVIDER_SITE_OTHER)

## 2023-06-02 VITALS — BP 108/70 | HR 62 | Temp 97.9°F | Ht 68.0 in | Wt 124.8 lb

## 2023-06-02 DIAGNOSIS — R062 Wheezing: Secondary | ICD-10-CM

## 2023-06-02 DIAGNOSIS — E782 Mixed hyperlipidemia: Secondary | ICD-10-CM

## 2023-06-02 DIAGNOSIS — R42 Dizziness and giddiness: Secondary | ICD-10-CM | POA: Diagnosis not present

## 2023-06-02 DIAGNOSIS — L659 Nonscarring hair loss, unspecified: Secondary | ICD-10-CM

## 2023-06-02 DIAGNOSIS — J4540 Moderate persistent asthma, uncomplicated: Secondary | ICD-10-CM

## 2023-06-02 DIAGNOSIS — E559 Vitamin D deficiency, unspecified: Secondary | ICD-10-CM

## 2023-06-02 DIAGNOSIS — R7303 Prediabetes: Secondary | ICD-10-CM

## 2023-06-02 DIAGNOSIS — H9193 Unspecified hearing loss, bilateral: Secondary | ICD-10-CM | POA: Diagnosis not present

## 2023-06-02 DIAGNOSIS — E538 Deficiency of other specified B group vitamins: Secondary | ICD-10-CM

## 2023-06-02 MED ORDER — MIDODRINE HCL 10 MG PO TABS: 10.0000 mg | ORAL_TABLET | Freq: Two times a day (BID) | ORAL | Status: DC | PRN

## 2023-06-02 MED ORDER — LEVALBUTEROL TARTRATE 45 MCG/ACT IN AERO: 1.0000 | INHALATION_SPRAY | RESPIRATORY_TRACT | 12 refills | Status: DC | PRN

## 2023-06-02 MED ORDER — BREZTRI AEROSPHERE 160-9-4.8 MCG/ACT IN AERO: 2.0000 | INHALATION_SPRAY | Freq: Two times a day (BID) | RESPIRATORY_TRACT | 11 refills | Status: DC

## 2023-06-02 NOTE — Assessment & Plan Note (Signed)
Chronic Taking vitamin B12 Check B12 level

## 2023-06-02 NOTE — Assessment & Plan Note (Signed)
 Chronic This has been going on for a while Has some dizziness and lightheadedness Some of this is likely related to her hypotension Continue midodrine twice daily as needed for SBP 100 or less Encouraged her to increase her calorie intake and a healthy weight to help gain weight Some of her dizziness does seem like BPPV-advised physical therapy-she deferred and would like to see ENT first-referral ordered

## 2023-06-02 NOTE — Assessment & Plan Note (Signed)
 Chronic Referral to ENT for dizziness, hearing loss Advised doing vestibular PT for the dizziness-she would like to see ENT first

## 2023-06-02 NOTE — Assessment & Plan Note (Signed)
 Chronic Likely multifactorial-stress, nutrition related Will check TSH, vit B12 level

## 2023-06-02 NOTE — Assessment & Plan Note (Signed)
 Chronic Controlled Continue Breztri inhaler 2 puffs twice daily as needed, Xopenex inhaler as needed

## 2023-06-02 NOTE — Patient Instructions (Addendum)
    Have a chest xray today    Blood work was ordered - have this done tomorrow. .       Medications changes include :   None    A referral was ordered for [facility] ENT and someone will call you to schedule an appointment.

## 2023-06-02 NOTE — Assessment & Plan Note (Signed)
 Chronic Check vitamin D level Continue current vitamin D supplementation

## 2023-06-02 NOTE — Assessment & Plan Note (Signed)
 Acute Has some wheeze in her left lung base Chest x-ray today Continue inhalers-further treatment based on x-ray

## 2023-06-02 NOTE — Assessment & Plan Note (Signed)
 Chronic Check lipid panel  Not taking crestor-could be causing osteoporosis to worsen Cardiology would like her on a statin if not Repatha given her increased CAC and she will discuss this with Dr. Elvera Lennox Recheck lipid panel

## 2023-06-02 NOTE — Assessment & Plan Note (Signed)
Chronic Check a1c Low sugar / carb diet Stressed regular exercise  

## 2023-06-03 ENCOUNTER — Other Ambulatory Visit (INDEPENDENT_AMBULATORY_CARE_PROVIDER_SITE_OTHER)

## 2023-06-03 DIAGNOSIS — R7303 Prediabetes: Secondary | ICD-10-CM | POA: Diagnosis not present

## 2023-06-03 DIAGNOSIS — E538 Deficiency of other specified B group vitamins: Secondary | ICD-10-CM

## 2023-06-03 DIAGNOSIS — E782 Mixed hyperlipidemia: Secondary | ICD-10-CM

## 2023-06-03 DIAGNOSIS — E559 Vitamin D deficiency, unspecified: Secondary | ICD-10-CM | POA: Diagnosis not present

## 2023-06-03 DIAGNOSIS — L659 Nonscarring hair loss, unspecified: Secondary | ICD-10-CM | POA: Diagnosis not present

## 2023-06-03 LAB — CBC WITH DIFFERENTIAL/PLATELET
Basophils Absolute: 0.1 10*3/uL (ref 0.0–0.1)
Basophils Relative: 1.2 % (ref 0.0–3.0)
Eosinophils Absolute: 0.1 10*3/uL (ref 0.0–0.7)
Eosinophils Relative: 2.4 % (ref 0.0–5.0)
HCT: 39.6 % (ref 36.0–46.0)
Hemoglobin: 13.2 g/dL (ref 12.0–15.0)
Lymphocytes Relative: 23.9 % (ref 12.0–46.0)
Lymphs Abs: 1.2 10*3/uL (ref 0.7–4.0)
MCHC: 33.4 g/dL (ref 30.0–36.0)
MCV: 90.2 fl (ref 78.0–100.0)
Monocytes Absolute: 0.4 10*3/uL (ref 0.1–1.0)
Monocytes Relative: 7.3 % (ref 3.0–12.0)
Neutro Abs: 3.2 10*3/uL (ref 1.4–7.7)
Neutrophils Relative %: 65.2 % (ref 43.0–77.0)
Platelets: 241 10*3/uL (ref 150.0–400.0)
RBC: 4.39 Mil/uL (ref 3.87–5.11)
RDW: 14.5 % (ref 11.5–15.5)
WBC: 5 10*3/uL (ref 4.0–10.5)

## 2023-06-03 LAB — HEMOGLOBIN A1C: Hgb A1c MFr Bld: 5.9 % (ref 4.6–6.5)

## 2023-06-03 LAB — LIPID PANEL
Cholesterol: 171 mg/dL (ref 0–200)
HDL: 62.3 mg/dL (ref >39.00)
LDL Cholesterol: 93 mg/dL (ref 0–99)
NonHDL: 109.1
Total CHOL/HDL Ratio: 3
Triglycerides: 81 mg/dL (ref 0.0–149.0)
VLDL: 16.2 mg/dL (ref 0.0–40.0)

## 2023-06-03 LAB — COMPREHENSIVE METABOLIC PANEL WITH GFR
ALT: 15 U/L (ref 0–35)
AST: 21 U/L (ref 0–37)
Albumin: 4.6 g/dL (ref 3.5–5.2)
Alkaline Phosphatase: 37 U/L — ABNORMAL LOW (ref 39–117)
BUN: 15 mg/dL (ref 6–23)
CO2: 30 meq/L (ref 19–32)
Calcium: 9.6 mg/dL (ref 8.4–10.5)
Chloride: 103 meq/L (ref 96–112)
Creatinine, Ser: 0.7 mg/dL (ref 0.40–1.20)
GFR: 82.34 mL/min (ref >60.00)
Glucose, Bld: 92 mg/dL (ref 70–99)
Potassium: 4.4 meq/L (ref 3.5–5.1)
Sodium: 139 meq/L (ref 135–145)
Total Bilirubin: 0.5 mg/dL (ref 0.2–1.2)
Total Protein: 7 g/dL (ref 6.0–8.3)

## 2023-06-03 LAB — VITAMIN D 25 HYDROXY (VIT D DEFICIENCY, FRACTURES): VITD: 60.69 ng/mL (ref 30.00–100.00)

## 2023-06-03 LAB — TSH: TSH: 3.82 u[IU]/mL (ref 0.35–5.50)

## 2023-06-03 LAB — VITAMIN B12: Vitamin B-12: 826 pg/mL (ref 211–911)

## 2023-06-12 ENCOUNTER — Ambulatory Visit: Admitting: Internal Medicine

## 2023-06-12 ENCOUNTER — Encounter: Payer: Self-pay | Admitting: Internal Medicine

## 2023-06-12 ENCOUNTER — Telehealth: Payer: Self-pay | Admitting: Internal Medicine

## 2023-06-12 VITALS — BP 120/70 | HR 56 | Ht 68.0 in | Wt 128.4 lb

## 2023-06-12 DIAGNOSIS — E559 Vitamin D deficiency, unspecified: Secondary | ICD-10-CM

## 2023-06-12 DIAGNOSIS — M810 Age-related osteoporosis without current pathological fracture: Secondary | ICD-10-CM

## 2023-06-12 NOTE — Patient Instructions (Addendum)
 Please decrease vitamin D to 5000 units every other day.   Stop the magnesium supplement. Take the BoneUp supplement  - 6 tablets a day.  Also, continue Prolia.  Cholesterol medications should not have any significant impact on the bones.  Please come back for a follow-up appointment in 1 year.

## 2023-06-12 NOTE — Telephone Encounter (Signed)
 Copied from CRM [id number]. Topic: Clinical - Lab/Test Results >> Jun 12, 2023  2:04 PM [place] J wrote: Reason for CRM: Patient is needing imaging results relayed over. Other lab results were relayed to patient.

## 2023-06-12 NOTE — Progress Notes (Signed)
 Patient ID: [name], female   DOB: Sep 15, 1945, 78 y.o.   MRN: [id number]   HPI  [name] is a 78 y.o.-year-old female, referred by her PCP, Dr. Lawerance Bach, for management of osteoporosis (OP).  I previously saw the patient in 03/2018, but last visit was actually in 02/2023.  He is here accompanied by her husband who offers part of the history especially related to fracture history, activity, and symptoms.  Interim history: No falls or fractures since last visit.   She was recently found to have aortic atherosclerosis and will have an appointment with vascular surgery. She is here as she remembered that at last visit I advised her to stop Crestor - but we did not address this at last OV... Unfortunately, she is off Crestor now.  Pt was dx with OP in 12/2017.  I reviewed pt's DXA scan reports:   L1-L3 (L4) T score FN T score 33% distal Radius  10/07/2021 ([facility]) -2.4 RFN: -2.0 LFN: n/a -2.3  12/16/2017 ([facility]) -2.5 (-5.2%*) RFN: -1.9 LFN: -2.2 n/a  05/17/2015 ([facility]) -2.2  RFN: -1.9 LFN: -2.1 n/a   She does have neuropathy and back pain.  Previous fractures: Before last visit, she had a left intertrochanteric femoral fracture 08/2018 after a fall going down steps, for which she had to have ORIF.  She sees Delbert Harness.  She also had a wrist fracture in 05/2021 - fell on uneven ground.    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  It is unclear whether this  could be related to her Fosamax use in the past or not.  She does have a h/o vitamin D deficiency. Reviewed available vit D levels: Lab Results  Component Value Date   VD25OH 60.69 06/03/2023   VD25OH 40 02/03/2023   VD25OH 64.14 01/21/2022   VD25OH 38.55 06/25/2021   VD25OH 46.43 10/15/2020   VD25OH 62.16 04/27/2020   VD25OH 62.87 12/26/2019   VD25OH 69.69 07/21/2019    VD25OH 29.77 (L) 12/22/2018   VD25OH 30.94 12/18/2017   Pt is on: - calcium 1000 mg daily  >> now 500 mg  - vitamin D 1000 IU daily + 800 IU from Bone Up supplement (+ vitamin K2)-started 01/2018 >> 500 units + 5000 units daily  No weight bearing exercises.   She has DDD, and also trochanteric bursitis.  She is on steroid inj's in hip and back.  She also has orthostatic hypotension (dysautonomia) and is on midodrine.  She has a history of syncopal episodes, but not recently.  She does not take high vitamin A doses.  Menopause was in her late 30s and she was on HRT for years.  FH of osteoporosis: Mother, aunt.  No h/o persistent hyper/hypocalcemia or hyperparathyroidism. No h/o kidney stones. Lab Results  Component Value Date   CALCIUM 9.6 06/03/2023   CALCIUM 9.9 02/06/2023   CALCIUM 9.4 12/24/2022   CALCIUM 9.6 11/20/2022   CALCIUM 8.0 (L) 11/13/2022   CALCIUM 9.2 08/13/2022   CALCIUM 9.5 06/12/2022   CALCIUM 10.0 05/12/2022   CALCIUM 8.4 (L) 04/30/2022   CALCIUM 9.2 01/21/2022   No h/o thyrotoxicosis. Reviewed TSH recent levels:  Lab Results  Component Value Date   TSH 3.82 06/03/2023   TSH 2.69 06/12/2022   TSH 3.76 01/21/2022   TSH 2.88 10/09/2021   TSH 3.13 12/26/2019   No h/o CKD. Last BUN/Cr: Lab Results  Component Value Date   BUN 15 06/03/2023   CREATININE 0.70 06/03/2023   She also has a history of IBS with constipation, GERD-on Protonix occs., hyperlipidemia, prediabetes. Before her last visit she mentioned that she lost approximately 15 pounds due to anxiety and depression.  ROS: + see HPI  I reviewed pt's medications, allergies, PMH, social hx, family hx, and changes were documented in the history of present illness. Otherwise, unchanged from my initial visit note.  Past Medical History:  Diagnosis Date   Allergy    Anemia    Anxiety    Asthma    Cataract    bil cateracts removed   Complication of anesthesia    Depression     generalized anxiety disorder   Diverticulosis    Duodenal diverticulum    Family history of adverse reaction to anesthesia    Mother and Daughters- N/V   Fibromyalgia    Gastroesophageal reflux disease with hiatal hernia    Hiatal hernia    History of kidney stones    Hyperlipidemia    IBS (irritable bowel syndrome)    Lymphocytic colitis    Dr Jarold Motto   Migraine headache    Mitral valve prolapse    Neuropathy    Osteopenia    BMD ordered by GYN   Osteoporosis    Peripheral neuropathy    treated as RLS by  Neurology   PONV (postoperative nausea and vomiting)    Restless leg syndrome    Schatzki's ring    History of   Sleep apnea    On CPAP, has not been using   Spondylosis    Past Surgical History:  Procedure Laterality Date   ANAL RECTAL MANOMETRY N/A 11/14/2015   Procedure: ANO RECTAL MANOMETRY;  Surgeon: Iva Boop, MD;  Location: [facility];  Service: Endoscopy;  Laterality: N/A;   BREAST ENHANCEMENT SURGERY     CATARACT EXTRACTION Bilateral    cataract surgery Left    Dr Hazle Quant   CHOLECYSTECTOMY     COLONOSCOPY     ESOPHAGEAL DILATION     X 2   ROTATOR CUFF REPAIR Left    SINUS ENDO W/FUSION N/A 05/30/2014   Procedure: REVISION  FRONTAL SINUS SURGERY WITH FUSION SCAN;  Surgeon: Osborn Coho, MD;  Location: [facility] OR;  Service: ENT;  Laterality: N/A;   SINUS SURGERY WITH INSTATRAK     x 2   TUBAL LIGATION     UPPER GASTROINTESTINAL ENDOSCOPY     Vaginal cystectomy     x 2   VAGINAL HYSTERECTOMY  05/2007   Vaginal repair, Dr Nicholas Lose.  Partial  hysterectomy.   WRIST ARTHROSCOPY     Social History   Socioeconomic History   Marital status: Married    Spouse name: Not on file   Number of children: 2   Years of education: 81   Highest education level: Not on file  Occupational History   Occupation: retired    Associate Professor: [employer]  Tobacco Use   Smoking status: Former    Current packs/day: 0.00    Average packs/day: 0.5 packs/day for 15.0 years (7.5  ttl pk-yrs)    Types: Cigarettes    Start date: 03/04/1983    Quit date: 03/03/1998    Years since quitting: 25.2   Smokeless tobacco: Never   Tobacco comments:    smoked 1973- 2006, up to <  1  ppd  Vaping Use   Vaping status: Never Used  Substance and Sexual Activity  Alcohol use: No    Alcohol/week: 0.0 standard drinks of alcohol   Drug use: No   Sexual activity: Yes    Partners: Male    Comment: 1st intercourse- 71, partners- 2, married- 22 yrs   Other Topics Concern   Not on file  Social History Narrative   HAS REGULAR EXERCISE   DAILY CAFFEINE: 1-2 CUPS   Patient is right handed.   Social Drivers of Corporate investment banker Strain: Not on file  Food Insecurity: Low Risk  (06/08/2023)   Received from [facility]   Hunger Vital Sign    Worried About Running Out of Food in the Last Year: Never true    Ran Out of Food in the Last Year: Never true  Transportation Needs: No Transportation Needs (06/08/2023)   Received from [facility]    In the past 12 months, has lack of reliable transportation kept you from medical appointments, meetings, work or from getting things needed for daily living? : No  Physical Activity: Not on file  Stress: Not on file  Social Connections: Not on file  Intimate Partner Violence: Not on file   Current Outpatient Medications on File Prior to Visit  Medication Sig Dispense Refill   Azelastine-Fluticasone 137-50 MCG/ACT SUSP SMARTSIG:1 Spray(s) Both Nares Every 12 Hours     budeson-glycopyrrolate-formoterol (BREZTRI AEROSPHERE) 160-9-4.8 MCG/ACT AERO Inhale 2 puffs into the lungs 2 (two) times daily. 10.7 g 11   Cyanocobalamin (VITAMIN B-12 PO) Take 1 tablet by mouth daily.     famotidine (PEPCID) 40 MG tablet Take 1 tablet (40 mg total) by mouth daily. For heartburn 90 tablet 1   hydrOXYzine (ATARAX) 25 MG tablet Take 25 mg by mouth daily as needed.     levalbuterol (XOPENEX HFA) 45 MCG/ACT inhaler Inhale 1-2 puffs into the  lungs every 4 (four) hours as needed for wheezing. 1 each 12   midodrine (PROAMATINE) 10 MG tablet Take 1 tablet (10 mg total) by mouth 2 (two) times daily as needed (for SBP 100 or less).     OVER THE COUNTER MEDICATION Bone Up 1000 mg 2 tabs TID     OVER THE COUNTER MEDICATION B-Right (B-Complex) 1 po daily     Probiotic Product (ALIGN) 4 MG CAPS Take 1 capsule by mouth as needed.     RESTASIS 0.05 % ophthalmic emulsion 1 drop 2 (two) times daily.     traZODone (DESYREL) 50 MG tablet Take 50 mg by mouth at bedtime.     vitamin C (ASCORBIC ACID) 500 MG tablet Take 500 mg by mouth daily.     vitamin E 180 MG (400 UNITS) capsule Take 400 Units by mouth daily.     [DISCONTINUED] Hyoscyamine-Phenyltoloxamine (DIGEX NF) [id number] MG CAPS Take one tablet by mouth as needed for abdominal cramping 24 each 0   No current facility-administered medications on file prior to visit.   Allergies  Allergen Reactions   Tape Rash   Albuterol     heartburn   Cymbalta [Duloxetine Hcl]     Diarrhea, nausea, anxiety worse, shaky   Latex Other (See Comments)   Lyrica [Pregabalin] Other (See Comments)    Sedation   Neurontin [Gabapentin] Other (See Comments)    Sedation   Nitrofuran Derivatives Other (See Comments)    "tingling"   Paxil [Paroxetine Hcl]     Bowel upset, tingling   Prozac [Fluoxetine Hcl] Other (See Comments)    Made patient worse and constipation  Family History  Problem Relation Age of Onset   Hypertension Father 34   Stroke Father 69   Heart attack Father         ? in 58s   Hypertension Mother    Neuropathy Mother    Anemia Mother    Coronary artery disease Brother        Stent placement in 33s   Diabetes Maternal Uncle    Heart attack Paternal Uncle        SEVEN , ? age   Colon cancer Neg Hx    Seizures Neg Hx    Esophageal cancer Neg Hx    Rectal cancer Neg Hx    Stomach cancer Neg Hx    PE: BP 120/70   Pulse (!) 56   Ht 5\' 8"  (1.727 m)   Wt 128 lb 6.4 oz (58.2  kg)   SpO2 95%   BMI 19.52 kg/m  Wt Readings from Last 3 Encounters:  06/12/23 128 lb 6.4 oz (58.2 kg)  06/02/23 124 lb 12.8 oz (56.6 kg)  05/19/23 127 lb 6 oz (57.8 kg)   Constitutional: normal weight, in NAD, + kyphosis Eyes:  EOMI, no exophthalmos ENT: no neck masses, no cervical lymphadenopathy Cardiovascular: RRR, No MRG Respiratory: CTA B Musculoskeletal: no deformities other than kyphosis Skin:no rashes Neurological: + tremor with outstretched hands  Assessment: 1. Osteoporosis  2.  History of vitamin D deficiency  Plan: 1. Osteoporosis -Likely menopausal/ age-related, and she also has a family history of osteoporosis -At last visit, she returned after long absence of approximately 5 years, in which interval she had 2 fractures: a hip and a wrist fracture.  These were complicated as she required surgery with intramedullary rods and screws -Last visit, I discussed with the patient and her husband about her increased risk of fracture especially due to previous fractures and history of falls.  The latest bone density scan was obtained on a different machine than previously so the T-scores were not directly comparable but they were borderline between osteopenia and osteoporosis.We discussed that the risk of fracture does not have a clear threshold, and especially in the setting of her recent hip and wrist fracture, she was at an increased risk for fractures. -We discussed about fall precautions.  No falls or fractures since last visit. - given handout from [facility] Re: weight bearing exercises - advised to do this every day or at least 5/7 days.  She is not exercising now as she has been more tired recently -I previously recommended the [facility] for skeletal loading but she did not try this.  I recommended physical therapy at [facility] and she was able to do this. -We discussed at last visit about maintaining a good amount of protein in the  diet-at least 50 g a day for her.  She is not drinking more than 2 alcoholic drinks a day or smoking. -At last visit, after discussion about different medication classes, risks and benefits, she agreed to try Prolia -We were able to start Prolia-she had the first injection at the [facility] infusion center 03/03/2023 -No jaw/thigh/hip pain from Prolia.  Of note, she has a distant history of Fosamax use which could have caused jaw degeneration and we should be careful with Prolia but this is not completely contraindicated.  She does not complain of any jaw and dental issues now. - will recheck a bone density scan in 2 years after starting Prolia -during the summer -At today's visit,  she mentions that she scheduled this appointment sooner at the recommendation of her cardiologist to see if she could start back on Crestor or maybe a PCSK9 inhibitor.  However, we did not address this at last visit and I definitely did not advise her to stop her cholesterol medications, since these are not known to cause deleterious effects on bone density!  I advised her to restart Crestor right away and to adjust the regimen as needed, per her cardiologist. - will see pt back in 1 year  2.  History of vitamin D deficiency -She continues vitamin D but she takes a much higher dose: At last visit she was taking approximately 1800 units vitamin D daily but she is now taking 5500 units. I advised her to reduce the dose by taking her 5000 units supplement every other day. -Vitamin D level was normal, but higher than before Lab Results  Component Value Date   VD25OH 60.69 06/03/2023  -Will continue to keep an eye on this  Patient Instructions  Please decrease vitamin D to 5000 units every other day.   Stop the magnesium supplement. Take the BoneUp supplement  - 6 tablets a day.  Also, continue Prolia.  Cholesterol medications should not have any significant impact on the bones.  Please come back for a follow-up  appointment in 1 year.  Orders Placed This Encounter  Procedures   DG Bone Density   Carlus Pavlov, MD PhD [facility] End[facility]

## 2023-06-16 NOTE — Telephone Encounter (Signed)
 Results given to patient on yesterday.

## 2023-06-29 ENCOUNTER — Other Ambulatory Visit (INDEPENDENT_AMBULATORY_CARE_PROVIDER_SITE_OTHER): Payer: Self-pay | Admitting: Vascular Surgery

## 2023-06-29 DIAGNOSIS — K551 Chronic vascular disorders of intestine: Secondary | ICD-10-CM

## 2023-06-30 ENCOUNTER — Ambulatory Visit (INDEPENDENT_AMBULATORY_CARE_PROVIDER_SITE_OTHER): Admitting: Vascular Surgery

## 2023-06-30 ENCOUNTER — Other Ambulatory Visit (INDEPENDENT_AMBULATORY_CARE_PROVIDER_SITE_OTHER)

## 2023-06-30 ENCOUNTER — Encounter (INDEPENDENT_AMBULATORY_CARE_PROVIDER_SITE_OTHER): Payer: Self-pay | Admitting: Vascular Surgery

## 2023-06-30 ENCOUNTER — Other Ambulatory Visit: Payer: Self-pay | Admitting: Internal Medicine

## 2023-06-30 VITALS — BP 121/69 | HR 54 | Resp 16 | Ht 68.0 in | Wt 124.8 lb

## 2023-06-30 DIAGNOSIS — K551 Chronic vascular disorders of intestine: Secondary | ICD-10-CM

## 2023-06-30 DIAGNOSIS — E782 Mixed hyperlipidemia: Secondary | ICD-10-CM

## 2023-06-30 DIAGNOSIS — I700 Atherosclerosis of aorta: Secondary | ICD-10-CM | POA: Diagnosis not present

## 2023-06-30 NOTE — Patient Instructions (Signed)
Angiogram  An angiogram is a procedure used to examine the blood vessels. In this procedure, contrast dye is injected through a soft tube (catheter) into an artery. X-rays are then taken, which show if there is a blockage or problem in a blood vessel. The catheter may be inserted in: The groin area. This is the most common. The fold of the arm, near the elbow. The wrist. Tell a health care provider about: Any allergies you have, including allergies to medicines, shellfish, contrast dye, or iodine. All medicines you are taking, including vitamins, herbs, eye drops, creams, and over-the-counter medicines. Any problems you or family members have had with anesthesia. Any bleeding problems you have. Any surgeries you have had. Any medical conditions you have or have had, including any kidney problems or kidney failure. Whether you are pregnant or may be pregnant. Whether you are breastfeeding. Any condition that may increase your stress and prevent you from lying still. This includes anxiety disorders or chronic pain. What are the risks? Your health care provider will talk with you about risks. These may include: Infection. Bleeding and bruising. Allergic reactions to medicines or dyes, including the contrast dye used. Damage to nearby structures or organs, including damage to blood vessels and kidney damage from the contrast dye. Blood clots that can lead to a stroke or heart attack. Death. What happens before the procedure? When to stop eating and drinking Follow instructions from your health care provider about what you may eat and drink before your procedure. These may include: 8 hours before your procedure Stop eating most foods. Do not eat meat, fried foods, or fatty foods. Eat only light foods, such as toast or crackers. All liquids are okay except energy drinks and alcohol. 6 hours before your procedure Stop eating. Drink only clear liquids, such as water, clear fruit juice,  black coffee, plain tea, and sports drinks. Do not drink energy drinks or alcohol. 2 hours before your procedure Stop drinking all liquids. You may be allowed to take medicines with small sips of water. If you do not follow your health care provider's instructions, your procedure may be delayed or canceled. Medicines Ask your health care provider about: Changing or stopping your regular medicines. These include any diabetes medicines or blood thinners you take. Taking medicines such as aspirin and ibuprofen. These medicines can thin your blood. Do not take them unless your health care provider tells you to. Taking over-the-counter medicines, vitamins, herbs, and supplements. Surgery safety Ask your health care provider: How your insertion site will be marked. What steps will be taken to help prevent infection. These may include: Removing hair at the insertion site. Washing skin with a germ-killing soap. General instructions Do not use any products that contain nicotine or tobacco for at least 4 weeks before the procedure. These products include cigarettes, chewing tobacco, and vaping devices, such as e-cigarettes. If you need help quitting, ask your health care provider. You may have blood samples taken. If you will be going home right after the procedure, plan to have a responsible adult: Take you home from the hospital or clinic. You will not be allowed to drive. Care for you for the time you are told. What happens during the procedure? You will lie on your back on an X-ray table. You may be strapped to the table if it is tilted. An IV will be inserted into one of your veins. Electrodes may be placed on your chest to monitor your heart rate during the procedure.  You will be given one or both of the following: A sedative. This helps you relax. Anesthesia. This will numb the area where the catheter will be inserted. A small incision will be made for catheter insertion. The catheter  will be inserted into an artery using a guide wire. An X-ray procedure (fluoroscopy) will be used to help guide the catheter to the blood vessel to be examined. A contrast dye will then be injected into the catheter, and X-rays will be taken. The contrast will help to show where any narrowing or blockages are located in the blood vessels. You may feel flushed as the contrast dye is injected. Tell your health care provider if you develop chest pain or trouble breathing. After the fluoroscopy is complete, the catheter will be removed. Pressure will be applied to stop bleeding. A closure device may also be used. A bandage (dressing) will be placed over the site where the catheter was inserted. The procedure may vary among health care providers and hospitals. What happens after the procedure? Your blood pressure, heart rate, breathing rate, and blood oxygen level will be monitored until you leave the hospital or clinic. You will be kept in bed lying flat for a period of time. If the catheter was inserted through your leg, you will be instructed not to bend or cross your legs. The insertion area and the pulse in your feet or wrist will be checked often. You will be instructed to drink plenty of fluids. This will help wash the contrast dye out of your body. You may have more blood tests and X-rays. You may also have a test that records the electrical activity of your heart (electrocardiogram, or ECG). If you were given a sedative, do not drive or operate machinery until your health care provider says that it is safe. You may have to avoid lifting. Ask your health care provider how much you can safely lift. It is up to you to get your test results. Ask your health care provider, or the department that is doing the test, when your results will be ready. This information is not intended to replace advice given to you by your health care provider. Make sure you discuss any questions you have with your health  care provider. Document Revised: 09/11/2021 Document Reviewed: 09/11/2021 Elsevier Patient Education  2024 [employer].

## 2023-06-30 NOTE — Assessment & Plan Note (Signed)
 Duplex today shows normal celiac, splenic, and hepatic artery findings.  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  It may be that she has only collateral flow around this.  Her symptoms are vague and not entirely clear with chronic visceral ischemia, but certainly could be related to malperfusion.  After long discussion with her today, we elected to go ahead and proceed with angiogram with possible intervention at that time depending on the findings of the angiography.  Risks and benefits of the procedure were discussed and informed consent was obtained.

## 2023-06-30 NOTE — Assessment & Plan Note (Signed)
 lipid control important in reducing the progression of atherosclerotic disease.  Patient has stopped taking her statin agent.  Recommended she discuss this with her primary care physician and consider restarting.  If we do perform mesenteric intervention, I would want her to be on a statin agent.

## 2023-06-30 NOTE — Assessment & Plan Note (Signed)
 Fairly mild on the CT scan and not currently problematic.

## 2023-06-30 NOTE — Progress Notes (Signed)
 Patient ID: Sharee Dates, female   DOB: [birth year], 78 y.o.   MRN: [id number]  Chief Complaint  Patient presents with   New Patient (Initial Visit)    Ref Jerelene Monday consult mesenteric artery stenosis    HPI [name] is a 78 y.o. female.  I am asked to see the patient by Dr. 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  The patient reports intermittent abdominal pain sometimes related to eating but not universally.  She has had a 10 pound weight loss.  The patient has what appears to be a significant level of anxiety at a baseline and she is very anxious about this.  She was being worked up for cardiac disease by Dr. Gollan who recognized a previous CT scan of the abdomen and pelvis from last year which I independently reviewed. Duplex today shows normal celiac, splenic, and hepatic artery findings.  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  It may be that she has only collateral flow around this.    Past Medical History:  Diagnosis Date   Allergy     Anemia    Anxiety    Asthma    Cataract    bil cateracts removed   Complication of anesthesia    Depression    generalized anxiety disorder   Diverticulosis    Duodenal diverticulum    Family history of adverse reaction to anesthesia    Mother and Daughters- N/V   Fibromyalgia    Gastroesophageal reflux disease with hiatal hernia    Hiatal hernia    History of kidney stones    Hyperlipidemia    IBS (irritable bowel syndrome)    Lymphocytic colitis    Dr Adan Holms   Migraine headache    Mitral valve prolapse    Neuropathy    Osteopenia    BMD ordered by GYN   Osteoporosis     Peripheral neuropathy    treated as RLS by  Neurology   PONV (postoperative nausea and vomiting)    Restless leg syndrome    Schatzki's ring    History of   Sleep apnea    On CPAP, has not been using   Spondylosis     Past Surgical History:  Procedure Laterality Date   ANAL RECTAL MANOMETRY N/A 11/14/2015   Procedure: ANO RECTAL MANOMETRY;  Surgeon: Kenney Peacemaker, MD;  Location: [facility];  Service: Endoscopy;  Laterality: N/A;   BREAST ENHANCEMENT SURGERY     CATARACT EXTRACTION Bilateral    cataract surgery Left    Dr Danley Dusky   CHOLECYSTECTOMY     COLONOSCOPY     ESOPHAGEAL DILATION     X 2   ROTATOR CUFF REPAIR Left    SINUS ENDO W/FUSION N/A 05/30/2014   Procedure: REVISION  FRONTAL SINUS SURGERY WITH FUSION SCAN;  Surgeon: Ammon Bales, MD;  Location: [facility] OR;  Service: ENT;  Laterality: N/A;   SINUS SURGERY WITH INSTATRAK     x 2   TUBAL LIGATION     UPPER GASTROINTESTINAL ENDOSCOPY     Vaginal cystectomy     x 2   VAGINAL HYSTERECTOMY  05/2007   Vaginal repair, Dr  Lomax.  Partial  hysterectomy.   WRIST ARTHROSCOPY       Family History  Problem Relation Age of Onset   Hypertension Father 48   Stroke Father 81   Heart attack Father         ? in 17s   Hypertension Mother    Neuropathy Mother    Anemia Mother    Coronary artery disease Brother        Stent placement in 23s   Diabetes Maternal Uncle    Heart attack Paternal Uncle        SEVEN , ? age   Colon cancer Neg Hx    Seizures Neg Hx    Esophageal cancer Neg Hx    Rectal cancer Neg Hx    Stomach cancer Neg Hx       Social History   Tobacco Use   Smoking status: Former    Current packs/day: 0.00    Average packs/day: 0.5 packs/day for 15.0 years (7.5 ttl pk-yrs)    Types: Cigarettes    Start date: 03/04/1983    Quit date: 03/03/1998    Years since quitting: 25.3   Smokeless tobacco: Never   Tobacco comments:    smoked 1973- 2006, up to <  1  ppd  Vaping Use   Vaping status: Never Used   Substance Use Topics   Alcohol use: No    Alcohol/week: 0.0 standard drinks of alcohol   Drug use: No     Allergies  Allergen Reactions   Tape Rash   Albuterol      heartburn   Cymbalta  [Duloxetine  Hcl]     Diarrhea, nausea, anxiety worse, shaky   Latex Other (See Comments)   Lyrica  [Pregabalin ] Other (See Comments)    Sedation   Neurontin [Gabapentin] Other (See Comments)    Sedation   Nitrofuran Derivatives Other (See Comments)    "tingling"   Paxil [Paroxetine Hcl]     Bowel upset, tingling   Prozac  [Fluoxetine  Hcl] Other (See Comments)    Made patient worse and constipation    Current Outpatient Medications  Medication Sig Dispense Refill   Azelastine -Fluticasone  137-50 MCG/ACT SUSP SMARTSIG:1 Spray(s) Both Nares Every 12 Hours     budeson-glycopyrrolate -formoterol  (BREZTRI  AEROSPHERE) 160-9-4.8 MCG/ACT AERO Inhale 2 puffs into the lungs 2 (two) times daily. 10.7 g 11   Cyanocobalamin  (VITAMIN B-12 PO) Take 1 tablet by mouth daily.     famotidine  (PEPCID ) 40 MG tablet Take 1 tablet (40 mg total) by mouth daily. For heartburn 90 tablet 1   hydrOXYzine (ATARAX) 25 MG tablet Take 25 mg by mouth daily as needed.     levalbuterol  (XOPENEX  HFA) 45 MCG/ACT inhaler Inhale 1-2 puffs into the lungs every 4 (four) hours as needed for wheezing. 1 each 12   midodrine  (PROAMATINE ) 10 MG tablet Take 1 tablet (10 mg total) by mouth 2 (two) times daily as needed (for SBP 100 or less).     OVER THE COUNTER MEDICATION Bone Up 1000 mg 2 tabs TID     OVER THE COUNTER MEDICATION B-Right (B-Complex) 1 po daily     PARoxetine (PAXIL) 20 MG tablet Take 20 mg by mouth at bedtime.     Probiotic Product (ALIGN) 4 MG CAPS Take 1 capsule by mouth as needed.     RESTASIS 0.05 % ophthalmic emulsion 1 drop 2 (two) times daily.     rosuvastatin  (CRESTOR ) 5 MG tablet Take 5 mg by mouth daily.     traZODone  (DESYREL )  50 MG tablet Take 50 mg by mouth at bedtime.     vitamin C (ASCORBIC ACID) 500 MG tablet  Take 500 mg by mouth daily.     vitamin E 180 MG (400 UNITS) capsule Take 400 Units by mouth daily.     No current facility-administered medications for this visit.      REVIEW OF SYSTEMS (Negative unless checked)  Constitutional: [] Weight loss  [] Fever  [] Chills Cardiac: [] Chest pain   [] Chest pressure   [] Palpitations   [] Shortness of breath when laying flat   [] Shortness of breath at rest   [] Shortness of breath with exertion. Vascular:  [] Pain in legs with walking   [] Pain in legs at rest   [] Pain in legs when laying flat   [] Claudication   [] Pain in feet when walking  [] Pain in feet at rest  [] Pain in feet when laying flat   [] History of DVT   [] Phlebitis   [] Swelling in legs   [] Varicose veins   [] Non-healing ulcers Pulmonary:   [] Uses home oxygen   [] Productive cough   [] Hemoptysis   [] Wheeze  [x] COPD   [] Asthma Neurologic:  [] Dizziness  [] Blackouts   [] Seizures   [] History of stroke   [] History of TIA  [] Aphasia   [] Temporary blindness   [] Dysphagia   [] Weakness or numbness in arms   [] Weakness or numbness in legs Musculoskeletal:  [x] Arthritis   [] Joint swelling   [] Joint pain   [] Low back pain Hematologic:  [] Easy bruising  [] Easy bleeding   [] Hypercoagulable state   [x] Anemic  [] Hepatitis Gastrointestinal:  [] Blood in stool   [] Vomiting blood  [x] Gastroesophageal reflux/heartburn   [x] Abdominal pain Genitourinary:  [] Chronic kidney disease   [] Difficult urination  [] Frequent urination  [] Burning with urination   [] Hematuria Skin:  [] Rashes   [] Ulcers   [] Wounds Psychological:  [x] History of anxiety   [x]  History of major depression.    Physical Exam BP 121/69   Pulse (!) 54   Resp 16   Ht 5\' 8"  (1.727 m)   Wt 124 lb 12.8 oz (56.6 kg)   BMI 18.98 kg/m  Gen:  WD/WN, NAD. Appears younger than stated age.  Head: [place]/AT, No temporalis wasting.  Ear/Nose/Throat: Hearing grossly intact, nares w/o erythema or drainage, oropharynx w/o Erythema/Exudate Eyes: Conjunctiva clear,  sclera non-icteric  Neck: trachea midline.  No JVD.  Pulmonary:  Good air movement, respirations not labored, no use of accessory muscles  Cardiac: bradycardic  Vascular:  Vessel Right Left  Radial Palpable Palpable                                   Gastrointestinal:. Mildly tender to palpation in the LLQ. No rebound or guarding Musculoskeletal: M/S 5/5 throughout.  Extremities without ischemic changes.  No deformity or atrophy. No edema. Scattered varicosities bilaterally.  Neurologic: Sensation grossly intact in extremities.  Symmetrical.  Speech is fluent. Motor exam as listed above. Psychiatric: Judgment intact, Mood & affect appropriate for pt's clinical situation. Dermatologic: No rashes or ulcers noted.  No cellulitis or open wounds.    Radiology DG Chest 2 View Result Date: 06/02/2023 CLINICAL DATA:  Lung rales. EXAM: CHEST - 2 VIEW COMPARISON:  X-ray 11/13/2022 and older FINDINGS: Hyperinflation with chronic lung changes. No consolidation, pneumothorax or effusion. No edema. Normal cardiopericardial silhouette. IMPRESSION: Hyperinflation.  No acute cardiopulmonary disease. Electronically Signed   By: Adrianna Horde M.D.   On: 06/02/2023 14:08  Labs Recent Results (from the past 2160 hours)  TSH     Status: None   Collection Time: 06/03/23  8:48 AM  Result Value Ref Range   TSH 3.82 0.35 - 5.50 uIU/mL  VITAMIN D  25 Hydroxy (Vit-D Deficiency, Fractures)     Status: None   Collection Time: 06/03/23  8:48 AM  Result Value Ref Range   VITD 60.69 30.00 - 100.00 ng/mL  Vitamin B12     Status: None   Collection Time: 06/03/23  8:48 AM  Result Value Ref Range   Vitamin B-12 826 211 - 911 pg/mL  Comprehensive metabolic panel with GFR     Status: Abnormal   Collection Time: 06/03/23  8:48 AM  Result Value Ref Range   Sodium 139 135 - 145 mEq/L   Potassium 4.4 3.5 - 5.1 mEq/L   Chloride 103 96 - 112 mEq/L   CO2 30 19 - 32 mEq/L   Glucose, Bld 92 70 - 99 mg/dL   BUN 15 6  - 23 mg/dL   Creatinine, Ser 1.61 0.40 - 1.20 mg/dL   Total Bilirubin 0.5 0.2 - 1.2 mg/dL   Alkaline Phosphatase 37 (L) 39 - 117 U/L   AST 21 0 - 37 U/L   ALT 15 0 - 35 U/L   Total Protein 7.0 6.0 - 8.3 g/dL   Albumin 4.6 3.5 - 5.2 g/dL   GFR 09.60 >45.40 mL/min    Comment: Calculated using the CKD-EPI Creatinine Equation (2021)   Calcium  9.6 8.4 - 10.5 mg/dL  CBC with Differential/Platelet     Status: None   Collection Time: 06/03/23  8:48 AM  Result Value Ref Range   WBC 5.0 4.0 - 10.5 K/uL   RBC 4.39 3.87 - 5.11 Mil/uL   Hemoglobin 13.2 12.0 - 15.0 g/dL   HCT 98.1 19.1 - 47.8 %   MCV 90.2 78.0 - 100.0 fl   MCHC 33.4 30.0 - 36.0 g/dL   RDW 29.5 62.1 - 30.8 %   Platelets 241.0 150.0 - 400.0 K/uL   Neutrophils Relative % 65.2 43.0 - 77.0 %   Lymphocytes Relative 23.9 12.0 - 46.0 %   Monocytes Relative 7.3 3.0 - 12.0 %   Eosinophils Relative 2.4 0.0 - 5.0 %   Basophils Relative 1.2 0.0 - 3.0 %   Neutro Abs 3.2 1.4 - 7.7 K/uL   Lymphs Abs 1.2 0.7 - 4.0 K/uL   Monocytes Absolute 0.4 0.1 - 1.0 K/uL   Eosinophils Absolute 0.1 0.0 - 0.7 K/uL   Basophils Absolute 0.1 0.0 - 0.1 K/uL  Hemoglobin A1c     Status: None   Collection Time: 06/03/23  8:48 AM  Result Value Ref Range   Hgb A1c MFr Bld 5.9 4.6 - 6.5 %    Comment: Glycemic Control Guidelines for People with Diabetes:Non Diabetic:  <6%Goal of Therapy: <7%Additional Action Suggested:  >8%   Lipid panel     Status: None   Collection Time: 06/03/23  8:48 AM  Result Value Ref Range   Cholesterol 171 0 - 200 mg/dL    Comment: ATP III Classification       Desirable:  < 200 mg/dL               Borderline High:  200 - 239 mg/dL          High:  > = 657 mg/dL   Triglycerides 84.6 0.0 - 149.0 mg/dL    Comment: Normal:  <962 mg/dLBorderline High:  150 -  199 mg/dL   HDL 16.10 >96.04 mg/dL   VLDL 54.0 0.0 - 98.1 mg/dL   LDL Cholesterol 93 0 - 99 mg/dL   Total CHOL/HDL Ratio 3     Comment:                Men          Women1/2 Average  Risk     3.4          3.3Average Risk          5.0          4.42X Average Risk          9.6          7.13X Average Risk          15.0          11.0                       NonHDL 109.10     Comment: NOTE:  Non-HDL goal should be 30 mg/dL higher than patient's LDL goal (i.e. LDL goal of < 70 mg/dL, would have non-HDL goal of < 100 mg/dL)    Assessment/Plan:  Aortic atherosclerosis (HCC) Fairly mild on the CT scan and not currently problematic.  Hyperlipidemia lipid control important in reducing the progression of atherosclerotic disease.  Patient has stopped taking her statin agent.  Recommended she discuss this with her primary care physician and consider restarting.  If we do perform mesenteric intervention, I would want her to be on a statin agent.   Superior mesenteric artery stenosis (HCC) Duplex today shows normal celiac, splenic, and hepatic artery findings.  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  It may be that she has only collateral flow around this.  Her symptoms are vague and not entirely clear with chronic visceral ischemia, but certainly could be related to malperfusion.  After long discussion with her today, we elected to go ahead and proceed with angiogram with possible intervention at that time depending on the findings of the angiography.  Risks and benefits of the procedure were discussed and informed consent was obtained.      Mikki Alexander 06/30/2023, 9:56 AM   This note was created with Dragon medical transcription system.  Any errors from dictation are unintentional.

## 2023-07-07 ENCOUNTER — Other Ambulatory Visit: Payer: Self-pay | Admitting: Internal Medicine

## 2023-07-08 ENCOUNTER — Telehealth (INDEPENDENT_AMBULATORY_CARE_PROVIDER_SITE_OTHER): Payer: Self-pay

## 2023-07-08 NOTE — Telephone Encounter (Signed)
 Spoke with the patient and she is scheduled with Dr. Vonna Guardian for a mesenteric angio possible intervention on 08/13/23 with a 12:00 pm arrival time to the [facility]. Pre-procedure instructions were discussed and will be mailed. Patient was offered 07/20/23 and 08/02/23 and declined.

## 2023-07-21 ENCOUNTER — Other Ambulatory Visit: Payer: Self-pay | Admitting: Internal Medicine

## 2023-07-21 NOTE — Telephone Encounter (Signed)
 Patient called and wanted to be rescheduled from 08/13/23 to 08/20/23. Patient has been rescheduled with the same arrival time to the [facility].

## 2023-07-23 ENCOUNTER — Other Ambulatory Visit: Payer: Self-pay | Admitting: Internal Medicine

## 2023-07-23 NOTE — Telephone Encounter (Signed)
 Copied from CRM [id number]. Topic: Clinical - Medication Refill >> Jul 23, 2023 12:29 PM Marlan Silva wrote: Medication: rosuvastatin  (CRESTOR ) 5 MG tablet  Has the patient contacted their pharmacy? Yes (Agent: If no, request that the patient contact the pharmacy for the refill. If patient does not wish to contact the pharmacy document the reason why and proceed with request.) (Agent: If yes, when and what did the pharmacy advise?)  This is the patient's preferred pharmacy:  [facility] [phone] [facility],  - [address] [address] Phone: [phone] Fax: [phone]   Is this the correct pharmacy for this prescription? Yes If no, delete pharmacy and type the correct one.   Has the prescription been filled recently? Yes  Is the patient out of the medication? Yes  Has the patient been seen for an appointment in the last year OR does the patient have an upcoming appointment? Yes  Can we respond through MyChart? Yes  Agent: Please be advised that Rx refills may take up to 3 business days. We ask that you follow-up with your pharmacy.

## 2023-07-28 ENCOUNTER — Telehealth: Payer: Self-pay | Admitting: Internal Medicine

## 2023-07-28 NOTE — Telephone Encounter (Signed)
 Patient requesting to speak with a nurse in regards to bowl habit changes and bloating. Please advise.   Scheduled for next available with PA

## 2023-07-28 NOTE — Telephone Encounter (Signed)
 Pt has been rescheduled to [facility] on 08/06/23 at 130 pm for a sooner appt to discuss bowel habit changes.  She thanked me for the sooner appt and agrees to keep

## 2023-08-06 ENCOUNTER — Ambulatory Visit: Admitting: Physician Assistant

## 2023-08-06 ENCOUNTER — Encounter: Payer: Self-pay | Admitting: Internal Medicine

## 2023-08-06 ENCOUNTER — Encounter: Payer: Self-pay | Admitting: Physician Assistant

## 2023-08-06 VITALS — BP 112/70 | HR 55 | Ht 68.0 in | Wt 125.0 lb

## 2023-08-06 DIAGNOSIS — R1314 Dysphagia, pharyngoesophageal phase: Secondary | ICD-10-CM

## 2023-08-06 DIAGNOSIS — K5902 Outlet dysfunction constipation: Secondary | ICD-10-CM

## 2023-08-06 DIAGNOSIS — Z8719 Personal history of other diseases of the digestive system: Secondary | ICD-10-CM

## 2023-08-06 DIAGNOSIS — M9905 Segmental and somatic dysfunction of pelvic region: Secondary | ICD-10-CM | POA: Diagnosis not present

## 2023-08-06 DIAGNOSIS — Z8739 Personal history of other diseases of the musculoskeletal system and connective tissue: Secondary | ICD-10-CM

## 2023-08-06 DIAGNOSIS — K551 Chronic vascular disorders of intestine: Secondary | ICD-10-CM

## 2023-08-06 DIAGNOSIS — R159 Full incontinence of feces: Secondary | ICD-10-CM | POA: Diagnosis not present

## 2023-08-06 DIAGNOSIS — R1310 Dysphagia, unspecified: Secondary | ICD-10-CM

## 2023-08-06 DIAGNOSIS — K582 Mixed irritable bowel syndrome: Secondary | ICD-10-CM | POA: Diagnosis not present

## 2023-08-06 DIAGNOSIS — K52832 Lymphocytic colitis: Secondary | ICD-10-CM

## 2023-08-06 DIAGNOSIS — R109 Unspecified abdominal pain: Secondary | ICD-10-CM

## 2023-08-06 NOTE — Progress Notes (Unsigned)
 Subjective:    Patient ID: [name], female    DOB: 1945/05/27, 78 y.o.   MRN: [id number]     HPI Kynlei is here for follow up of her chronic medical problems.  Not using cpap.  She brought in her inhalers-she is confused about what she is supposed to be taking/using.  Medications and allergies reviewed with patient and updated if appropriate.  Current Outpatient Medications on File Prior to Visit  Medication Sig Dispense Refill   Azelastine -Fluticasone  137-50 MCG/ACT SUSP PLACE 1 SPRAY INTO THE NOSE EVERY 12 (TWELVE) HOURS. 23 g 8   budeson-glycopyrrolate -formoterol  (BREZTRI  AEROSPHERE) 160-9-4.8 MCG/ACT AERO Inhale 2 puffs into the lungs 2 (two) times daily. 10.7 g 11   Cyanocobalamin  (VITAMIN B-12 PO) Take 1 tablet by mouth daily.     hydrOXYzine (ATARAX) 25 MG tablet Take 25 mg by mouth daily as needed.     levalbuterol  (XOPENEX  HFA) 45 MCG/ACT inhaler Inhale 1-2 puffs into the lungs every 4 (four) hours as needed for wheezing. 1 each 12   midodrine  (PROAMATINE ) 10 MG tablet Take 1 tablet (10 mg total) by mouth 2 (two) times daily as needed (for SBP 100 or less).     OVER THE COUNTER MEDICATION Bone Up 1000 mg 2 tabs TID     OVER THE COUNTER MEDICATION B-Right (B-Complex) 1 po daily     OVER THE COUNTER MEDICATION      PARoxetine (PAXIL) 20 MG tablet Take 20 mg by mouth at bedtime.     Probiotic Product (ALIGN) 4 MG CAPS Take 1 capsule by mouth as needed.     RESTASIS 0.05 % ophthalmic emulsion 1 drop 2 (two) times daily.     rosuvastatin  (CRESTOR ) 5 MG tablet TAKE 1 TABLET (5 MG TOTAL) BY MOUTH DAILY FOR CHOLESTEROL 90 tablet 3   traZODone  (DESYREL ) 50 MG tablet Take 50 mg by mouth at bedtime.     vitamin C (ASCORBIC ACID) 500 MG tablet Take 500 mg by mouth daily.     vitamin E 180 MG (400 UNITS) capsule Take 400 Units by mouth daily.     [DISCONTINUED] Hyoscyamine -Phenyltoloxamine (DIGEX NF) 0.0625-15 MG CAPS Take one tablet by mouth as needed for  abdominal cramping 24 each 0   No current facility-administered medications on file prior to visit.     Review of Systems  Constitutional:  Negative for fever.  Respiratory:  Positive for shortness of breath. Negative for cough and wheezing.   Cardiovascular:  Positive for chest pain (occ - chronic - not cardiac) and palpitations (occ). Negative for leg swelling.  Gastrointestinal:  Positive for abdominal pain and nausea.  Neurological:  Positive for light-headedness (with low BP). Negative for headaches.       Objective:   Vitals:   08/07/23 1258  BP: 106/68  Pulse: 60  Temp: 98 F (36.7 C)  SpO2: 96%   BP Readings from Last 3 Encounters:  08/07/23 106/68  08/06/23 112/70  06/30/23 121/69   Wt Readings from Last 3 Encounters:  08/07/23 123 lb (55.8 kg)  08/06/23 125 lb (56.7 kg)  06/30/23 124 lb 12.8 oz (56.6 kg)   Body mass index is 18.7 kg/m.    Physical Exam Constitutional:      General: She is not in acute distress.    Appearance: Normal appearance.  HENT:     Head: Normocephalic and atraumatic.  Eyes:     Conjunctiva/sclera: Conjunctivae normal.  Cardiovascular:     Rate  and Rhythm: Normal rate and regular rhythm.     Heart sounds: Normal heart sounds.  Pulmonary:     Effort: Pulmonary effort is normal. No respiratory distress.     Breath sounds: Normal breath sounds. No wheezing.  Musculoskeletal:     Cervical back: Neck supple.     Right lower leg: No edema.     Left lower leg: No edema.  Lymphadenopathy:     Cervical: No cervical adenopathy.  Skin:    General: Skin is warm and dry.     Findings: No rash.  Neurological:     Mental Status: She is alert. Mental status is at baseline.  Psychiatric:        Mood and Affect: Mood normal.        Behavior: Behavior normal.        Lab Results  Component Value Date   WBC 5.0 06/03/2023   HGB 13.2 06/03/2023   HCT 39.6 06/03/2023   PLT 241.0 06/03/2023   GLUCOSE 92 06/03/2023   CHOL 171  06/03/2023   TRIG 81.0 06/03/2023   HDL 62.30 06/03/2023   LDLCALC 93 06/03/2023   ALT 15 06/03/2023   AST 21 06/03/2023   NA 139 06/03/2023   K 4.4 06/03/2023   CL 103 06/03/2023   CREATININE 0.70 06/03/2023   BUN 15 06/03/2023   CO2 30 06/03/2023   TSH 3.82 06/03/2023   INR 1.08 01/09/2009   HGBA1C 5.9 06/03/2023   MICROALBUR 2.5 (H) 01/06/2011     Assessment & Plan:    See Problem List for Assessment and Plan of chronic medical problems.

## 2023-08-06 NOTE — Patient Instructions (Addendum)
      Blood work was ordered.       Medications changes include :   None    A referral was ordered and someone will call you to schedule an appointment.     Return in about 6 months (around 02/06/2024) for Physical Exam.

## 2023-08-06 NOTE — Progress Notes (Signed)
 Chief Complaint: IBS-mixed, Abdominal Pain, Dysphagia, Fecal incontinence  HPI:    Mrs.  [name] is a 78 year old female with past medical history as listed below including a long history of IBS and lymphocytic colitis, known to Dr. Willy Harvest, who was referred to me by Colene Dauphin, MD for a complaint of IBS-mixed and history of lymphocytic colitis.      01/06/2023 patient seen in clinic by Dr. Willy Harvest at that time had alternating constipation and diarrhea that tended towards constipation.  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  At that time patient was managing her bowel regularity with a daily stool softener occasionally MiraLAX.  Also some abdominal pain and occasional blood in the toilet paper.  She was taking fiber supplements 2-3 times a day.  At that time discussed this is likely an exacerbation of her chronic IBS mixed pattern.  Recommend she get on MiraLAX on a daily basis and take 2 Dulcolax if she has not moved her bowels in 2 days.    06/03/2023 hemoglobin A1c, CBC, vitamin B12, vitamin D  and TSH all normal.  CMP with alk phos decreased at 37 otherwise normal.    06/30/2023 patient seen by vascular surgery for mesenteric artery stenosis.  Specifically stenosis of the SMA with intermittent abdominal pain related to eating a 10 pound weight loss.  At time discussed her symptoms were vague and not entirely clear with chronic visceral ischemia but certainly could be related to malperfusion.  It was decided to go ahead and proceed with angiogram with possible intervention.  This was scheduled for 08/20/2023.    Today, the patient presents to clinic and it sounds like she is having many of her similar issues radiating back-and-forth from diarrhea to constipation.  Tells me sometimes she leaks stool in her sleep, actual full feces.  She never did see physical therapy for her pelvic  floor dyssynergia suggested in the past.  Her husband is with her and tells me that they went to the beach for 10 or 11 days and just got back yesterday and prior to leaving she had a "bout" which she describes as severe constipation having to disimpact her and then after for 7 to 8 days being at the beach she had another episode.  She tells me that the only new symptom relief for her is this abdominal discomfort/pain on the left side of her abdomen.  She wonders if this is related to her SMA stenosis.  They describe her stool as clay at times.  Better with fiber.  Her husband asked about pancreatic issues.     She also describes some dysphagia towards pills over the past few years, recalls an EGD in the past where she had to be dilated.    Denies fever, chills, weight loss or blood in her stool.  GI history: CT abd/pelvis 11/2022 IMPRESSION: 1. No acute abnormality in the abdomen or pelvis. 2. Colonic diverticulosis without acute diverticulitis. 3. Aortic Atherosclerosis (ICD10-I70.0). 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  Colonoscopy December 09, 2019 at which point she was having a lot of diarrhea Small colonic polyps s/p polypectomy.  Both were adenomatous -Moderate sigmoid diverticulosis. -Otherwise normal colonoscopy to TI. -S/P random colonic and TI biopsies.  TI biopsies were normal colon biopsies demonstrated lymphocytic colitis    Past Medical History:  Diagnosis Date   Allergy     Anemia  Anxiety    Asthma    Cataract    bil cateracts removed   Complication of anesthesia    Depression    generalized anxiety disorder   Diverticulosis    Duodenal diverticulum    Family history of adverse reaction to anesthesia    Mother and Daughters- N/V   Fibromyalgia    Gastroesophageal reflux disease with hiatal hernia    Hiatal hernia    History of kidney stones    Hyperlipidemia    IBS (irritable bowel syndrome)    Lymphocytic colitis    Dr Adan Holms    Migraine headache    Mitral valve prolapse    Neuropathy    Osteopenia    BMD ordered by GYN   Osteoporosis    Peripheral neuropathy    treated as RLS by  Neurology   PONV (postoperative nausea and vomiting)    Restless leg syndrome    Schatzki's ring    History of   Sleep apnea    On CPAP, has not been using   Spondylosis     Past Surgical History:  Procedure Laterality Date   ANAL RECTAL MANOMETRY N/A 11/14/2015   Procedure: ANO RECTAL MANOMETRY;  Surgeon: Kenney Peacemaker, MD;  Location: [facility];  Service: Endoscopy;  Laterality: N/A;   BREAST ENHANCEMENT SURGERY     CATARACT EXTRACTION Bilateral    cataract surgery Left    Dr Danley Dusky   CHOLECYSTECTOMY     COLONOSCOPY     ESOPHAGEAL DILATION     X 2   ROTATOR CUFF REPAIR Left    SINUS ENDO W/FUSION N/A 05/30/2014   Procedure: REVISION  FRONTAL SINUS SURGERY WITH FUSION SCAN;  Surgeon: Ammon Bales, MD;  Location: [facility] OR;  Service: ENT;  Laterality: N/A;   SINUS SURGERY WITH INSTATRAK     x 2   TUBAL LIGATION     UPPER GASTROINTESTINAL ENDOSCOPY     Vaginal cystectomy     x 2   VAGINAL HYSTERECTOMY  05/2007   Vaginal repair, Dr Ola Berger.  Partial  hysterectomy.   WRIST ARTHROSCOPY      Current Outpatient Medications  Medication Sig Dispense Refill   Azelastine -Fluticasone  137-50 MCG/ACT SUSP PLACE 1 SPRAY INTO THE NOSE EVERY 12 (TWELVE) HOURS. 23 g 8   budeson-glycopyrrolate -formoterol  (BREZTRI  AEROSPHERE) 160-9-4.8 MCG/ACT AERO Inhale 2 puffs into the lungs 2 (two) times daily. 10.7 g 11   Cyanocobalamin  (VITAMIN B-12 PO) Take 1 tablet by mouth daily.     famotidine  (PEPCID ) 40 MG tablet TAKE 1 TABLET (40 MG TOTAL) BY MOUTH DAILY. FOR HEARTBURN 90 tablet 1   hydrOXYzine (ATARAX) 25 MG tablet Take 25 mg by mouth daily as needed.     levalbuterol  (XOPENEX  HFA) 45 MCG/ACT inhaler Inhale 1-2 puffs into the lungs every 4 (four) hours as needed for wheezing. 1 each 12   midodrine  (PROAMATINE ) 10 MG tablet Take 1  tablet (10 mg total) by mouth 2 (two) times daily as needed (for SBP 100 or less).     OVER THE COUNTER MEDICATION Bone Up 1000 mg 2 tabs TID     OVER THE COUNTER MEDICATION B-Right (B-Complex) 1 po daily     PARoxetine (PAXIL) 20 MG tablet Take 20 mg by mouth at bedtime.     Probiotic Product (ALIGN) 4 MG CAPS Take 1 capsule by mouth as needed.     RESTASIS 0.05 % ophthalmic emulsion 1 drop 2 (two) times daily.     rosuvastatin  (CRESTOR ) 5  MG tablet TAKE 1 TABLET (5 MG TOTAL) BY MOUTH DAILY FOR CHOLESTEROL 90 tablet 3   traZODone  (DESYREL ) 50 MG tablet Take 50 mg by mouth at bedtime.     vitamin C (ASCORBIC ACID) 500 MG tablet Take 500 mg by mouth daily.     vitamin E 180 MG (400 UNITS) capsule Take 400 Units by mouth daily.     No current facility-administered medications for this visit.    Allergies as of 08/06/2023 - Review Complete 06/30/2023  Allergen Reaction Noted   Tape Rash 08/04/2011   Albuterol   04/14/2016   Cymbalta  [duloxetine  hcl]  08/15/2015   Latex Other (See Comments) 01/09/2020   Lyrica  [pregabalin ] Other (See Comments) 12/14/2012   Neurontin [gabapentin] Other (See Comments) 12/14/2012   Nitrofuran derivatives Other (See Comments) 10/11/2012   Paxil [paroxetine hcl]  08/15/2015   Prozac  [fluoxetine  hcl] Other (See Comments) 08/16/2015    Family History  Problem Relation Age of Onset   Hypertension Father 36   Stroke Father 59   Heart attack Father         ? in 11s   Hypertension Mother    Neuropathy Mother    Anemia Mother    Coronary artery disease Brother        Stent placement in 28s   Diabetes Maternal Uncle    Heart attack Paternal Technical brewer , ? age   Colon cancer Neg Hx    Seizures Neg Hx    Esophageal cancer Neg Hx    Rectal cancer Neg Hx    Stomach cancer Neg Hx     Social History   Socioeconomic History   Marital status: Married    Spouse name: Not on file   Number of children: 2   Years of education: 18   Highest education  level: Not on file  Occupational History   Occupation: retired    Associate Professor: [employer]  Tobacco Use   Smoking status: Former    Current packs/day: 0.00    Average packs/day: 0.5 packs/day for 15.0 years (7.5 ttl pk-yrs)    Types: Cigarettes    Start date: 03/04/1983    Quit date: 03/03/1998    Years since quitting: 25.4   Smokeless tobacco: Never   Tobacco comments:    smoked 1973- 2006, up to <  1  ppd  Vaping Use   Vaping status: Never Used  Substance and Sexual Activity   Alcohol use: No    Alcohol/week: 0.0 standard drinks of alcohol   Drug use: No   Sexual activity: Yes    Partners: Male    Comment: 1st intercourse- 63, partners- 2, married- 22 yrs   Other Topics Concern   Not on file  Social History Narrative   HAS REGULAR EXERCISE   DAILY CAFFEINE: 1-2 CUPS   Patient is right handed.   Social Drivers of Corporate investment banker Strain: Not on file  Food Insecurity: Low Risk  (06/08/2023)   Received from [facility]   Hunger Vital Sign    Worried About Running Out of Food in the Last Year: Never true    Ran Out of Food in the Last Year: Never true  Transportation Needs: No Transportation Needs (06/08/2023)   Received from [facility]    In the past 12 months, has lack of reliable transportation kept you from medical appointments, meetings, work or from getting things needed for daily living? :  No  Physical Activity: Not on file  Stress: Not on file  Social Connections: Not on file  Intimate Partner Violence: Not on file    Review of Systems:    Constitutional: No weight loss, fever or chills Cardiovascular: No chest pain   Respiratory: No SOB Gastrointestinal: See HPI and otherwise negative   Physical Exam:  Vital signs: BP 112/70   Pulse (!) 55   Ht 5\' 8"  (1.727 m)   Wt 125 lb (56.7 kg)   BMI 19.01 kg/m    Constitutional:   Pleasant thin appearing , elderly Caucasian female appears to be in NAD, Well developed, Well nourished, alert  and cooperative Respiratory: Respirations even and unlabored. Lungs clear to auscultation bilaterally.   No wheezes, crackles, or rhonchi.  Cardiovascular: Normal S1, S2. No MRG. Regular rate and rhythm. No peripheral edema, cyanosis or pallor.  Gastrointestinal:  Soft, nondistended, nontender. No rebound or guarding. Normal bowel sounds. No appreciable masses or hepatomegaly. Rectal:  Not performed.  Psychiatric: Oriented to person, place and time. Demonstrates good judgement and reason without abnormal affect or behaviors.  RELEVANT LABS AND IMAGING: CBC    Component Value Date/Time   WBC 5.0 06/03/2023 0848   RBC 4.39 06/03/2023 0848   HGB 13.2 06/03/2023 0848   HCT 39.6 06/03/2023 0848   PLT 241.0 06/03/2023 0848   MCV 90.2 06/03/2023 0848   MCH 30.2 11/13/2022 1043   MCHC 33.4 06/03/2023 0848   RDW 14.5 06/03/2023 0848   LYMPHSABS 1.2 06/03/2023 0848   MONOABS 0.4 06/03/2023 0848   EOSABS 0.1 06/03/2023 0848   BASOSABS 0.1 06/03/2023 0848    CMP     Component Value Date/Time   NA 139 06/03/2023 0848   NA 139 12/24/2022 1245   K 4.4 06/03/2023 0848   CL 103 06/03/2023 0848   CO2 30 06/03/2023 0848   GLUCOSE 92 06/03/2023 0848   BUN 15 06/03/2023 0848   BUN 17 12/24/2022 1245   CREATININE 0.70 06/03/2023 0848   CALCIUM  9.6 06/03/2023 0848   PROT 7.0 06/03/2023 0848   PROT 6.5 12/11/2021 1217   ALBUMIN 4.6 06/03/2023 0848   ALBUMIN 4.3 04/20/2012 1018   AST 21 06/03/2023 0848   ALT 15 06/03/2023 0848   ALKPHOS 37 (L) 06/03/2023 0848   BILITOT 0.5 06/03/2023 0848   GFRNONAA >60 11/13/2022 1140   GFRAA >90 05/30/2014 0637    Assessment: 1.  IBS-mixed: More constipated with occasional leakage of solid stool, better with fiber 2.  History of lymphocytic colitis: Sounds like she is more constipated than diarrhea at this point 3.  Left-sided abdominal pain: Question with below versus known IBS 4.  History of SMA stenosis: Patient is set to have intervention on 19  June, this could be adding to abdominal pain and change in symptoms 5.  Dysphagia: Occasional pills are getting stuck in her throat, last EGD in 2012 with no dilation; that her stricture versus dysmotility versus other 6.  Pelvic floor dyssynergy and fecal incontinence: Documented via anorectal manometry in the past  Plan: 1.  Discussed with patient that other than the abdominal pain is all sounds like her chronic GI issues.  Offered her pelvic floor physical therapy again and she would like to pursue this now.  This is being done for documented pelvic floor dyssynergy from previous anorectal manometry. 2.  Would recommend she continue her fiber cereal daily as this seems to be helping with consistency of her stools.  Discussed adding MiraLAX  if needed. 3.  Ordered fecal pancreatic elastase today to rule out pancreatic issues 4.  Ordered a barium esophagram with tablet for further evaluation of possible stricture. 5.  Patient is having intervention with vascular for her SMA stenosis on the 19th June.  Discussed that this may help some of her abdominal pain and GI symptoms.  Would like to wait until this settles out before we proceed with any other workup including possible EGD or colonoscopy which she requested today. 6.  Patient to follow in clinic with me 4 to 6 weeks after stenosis intervention  Reginal Capra, PA-C [facility] 08/06/2023, 1:27 PM  Cc: Colene Dauphin, MD

## 2023-08-06 NOTE — Patient Instructions (Signed)
 Continue Fiber daily.   Your provider has requested that you go to the basement level for lab work before leaving today. Press "B" on the elevator. The lab is located at the first door on the left as you exit the elevator.  You have been scheduled for a Barium Esophogram at [facility] (1st floor of the hospital) on Wednesday 08/26/23 at 10 am. Please arrive 30 minutes prior to your appointment for registration. Make certain not to have anything to eat or drink 3 hours prior to your test. If you need to reschedule for any reason, please contact radiology at [phone] to do so. __________________________________________________________________ A barium swallow is an examination that concentrates on views of the esophagus. This tends to be a double contrast exam (barium and two liquids which, when combined, create a gas to distend the wall of the oesophagus) or single contrast (non-ionic iodine based). The study is usually tailored to your symptoms so a good history is essential. Attention is paid during the study to the form, structure and configuration of the esophagus, looking for functional disorders (such as aspiration, dysphagia, achalasia, motility and reflux) EXAMINATION You may be asked to change into a gown, depending on the type of swallow being performed. A radiologist and radiographer will perform the procedure. The radiologist will advise you of the type of contrast selected for your procedure and direct you during the exam. You will be asked to stand, sit or lie in several different positions and to hold a small amount of fluid in your mouth before being asked to swallow while the imaging is performed .In some instances you may be asked to swallow barium coated marshmallows to assess the motility of a solid food bolus. The exam can be recorded as a digital or video fluoroscopy procedure. POST PROCEDURE It will take 1-2 days for the barium to pass through your system. To  facilitate this, it is important, unless otherwise directed, to increase your fluids for the next 24-48hrs and to resume your normal diet.  This test typically takes about 30 minutes to perform. _____________________________________________________________

## 2023-08-07 ENCOUNTER — Ambulatory Visit: Payer: 59 | Admitting: Internal Medicine

## 2023-08-07 VITALS — BP 106/68 | HR 60 | Temp 98.0°F | Ht 68.0 in | Wt 123.0 lb

## 2023-08-07 DIAGNOSIS — K219 Gastro-esophageal reflux disease without esophagitis: Secondary | ICD-10-CM | POA: Diagnosis not present

## 2023-08-07 DIAGNOSIS — J45909 Unspecified asthma, uncomplicated: Secondary | ICD-10-CM | POA: Diagnosis not present

## 2023-08-07 DIAGNOSIS — E538 Deficiency of other specified B group vitamins: Secondary | ICD-10-CM

## 2023-08-07 DIAGNOSIS — E782 Mixed hyperlipidemia: Secondary | ICD-10-CM

## 2023-08-07 DIAGNOSIS — R7303 Prediabetes: Secondary | ICD-10-CM

## 2023-08-07 DIAGNOSIS — G4733 Obstructive sleep apnea (adult) (pediatric): Secondary | ICD-10-CM | POA: Diagnosis not present

## 2023-08-07 DIAGNOSIS — I950 Idiopathic hypotension: Secondary | ICD-10-CM

## 2023-08-07 MED ORDER — FAMOTIDINE 40 MG PO TABS: 40.0000 mg | ORAL_TABLET | Freq: Two times a day (BID) | ORAL | 1 refills | Status: DC | PRN

## 2023-08-07 MED ORDER — ONDANSETRON 4 MG PO TBDP: 4.0000 mg | ORAL_TABLET | Freq: Three times a day (TID) | ORAL | 0 refills | Status: DC | PRN

## 2023-08-07 MED ORDER — FLUTICASONE-SALMETEROL 100-50 MCG/ACT IN AEPB: 1.0000 | INHALATION_SPRAY | Freq: Two times a day (BID) | RESPIRATORY_TRACT | Status: DC

## 2023-08-07 NOTE — Assessment & Plan Note (Signed)
 Chronic Not using cpap Stressed the importance of using the cpap ? Needs new machine Referral ordered for pulm

## 2023-08-07 NOTE — Assessment & Plan Note (Addendum)
 Chronic Controlled Not using any inhalers now-she is confused about what inhalers to use when, which we reviewed Continue Wixela or Breztri  inhaler daily, Xopenex  inhaler as needed

## 2023-08-07 NOTE — Assessment & Plan Note (Signed)
 Chronic Taking vitamin B12

## 2023-08-07 NOTE — Assessment & Plan Note (Signed)
 Chronic Lab Results  Component Value Date   HGBA1C 5.9 06/03/2023    Check a1c Low sugar / carb diet Stressed regular exercise

## 2023-08-07 NOTE — Assessment & Plan Note (Signed)
 Chronic GERD not controlled - has GERD 2-3 times a week Continue famotidine  40 mg daily

## 2023-08-07 NOTE — Assessment & Plan Note (Addendum)
 Chronic Taking midodrine  as bid needed  -taking if SBP < =100 Continue to monitor BP at home Encouraged increasing fluid intake and trying to get the weight up-hopefully when she has her vascular procedure that will happen

## 2023-08-07 NOTE — Assessment & Plan Note (Signed)
 Chronic CT CAC in 2022 was 52.5   Lab Results  Component Value Date   LDLCALC 93 06/03/2023   Check lipid panel  Continue Crestor  5 mg daily

## 2023-08-13 DIAGNOSIS — K551 Chronic vascular disorders of intestine: Secondary | ICD-10-CM

## 2023-08-18 ENCOUNTER — Other Ambulatory Visit

## 2023-08-18 DIAGNOSIS — K5902 Outlet dysfunction constipation: Secondary | ICD-10-CM

## 2023-08-18 DIAGNOSIS — K582 Mixed irritable bowel syndrome: Secondary | ICD-10-CM

## 2023-08-19 ENCOUNTER — Telehealth (INDEPENDENT_AMBULATORY_CARE_PROVIDER_SITE_OTHER): Payer: Self-pay

## 2023-08-19 ENCOUNTER — Encounter: Payer: Self-pay | Admitting: Internal Medicine

## 2023-08-19 NOTE — Telephone Encounter (Signed)
 Patient called in to cancel her procedure with Dr. Vonna Guardian on 08/20/23 stating she has a cough at night and is afraid she will pull her stitches open after the procedure. Patient stated she will call back to reschedule.

## 2023-08-20 ENCOUNTER — Ambulatory Visit: Admission: RE | Admit: 2023-08-20 | Source: Home / Self Care | Admitting: Vascular Surgery

## 2023-08-20 ENCOUNTER — Encounter: Admission: RE | Payer: Self-pay | Source: Home / Self Care

## 2023-08-20 DIAGNOSIS — K551 Chronic vascular disorders of intestine: Secondary | ICD-10-CM

## 2023-08-20 SURGERY — VISCERAL ANGIOGRAPHY
Anesthesia: Moderate Sedation

## 2023-08-22 NOTE — Patient Instructions (Addendum)
     Have xrays downstairs    Medications changes include :   meloxicam  15 mg daily for 2 weeks - take with food   A referral was ordered for sports medicine.     Return if symptoms worsen or fail to improve.

## 2023-08-22 NOTE — Progress Notes (Unsigned)
    Subjective:    Patient ID: [name], female    DOB: 1945-08-27, 78 y.o.   MRN: [id number]      HPI [name] is here for No chief complaint on file.    Cough -     Medications and allergies reviewed with patient and updated if appropriate.  Current Outpatient Medications on File Prior to Visit  Medication Sig Dispense Refill   Azelastine -Fluticasone  137-50 MCG/ACT SUSP PLACE 1 SPRAY INTO THE NOSE EVERY 12 (TWELVE) HOURS. 23 g 8   budeson-glycopyrrolate -formoterol  (BREZTRI  AEROSPHERE) 160-9-4.8 MCG/ACT AERO Inhale 2 puffs into the lungs 2 (two) times daily. 10.7 g 11   Cyanocobalamin  (VITAMIN B-12 PO) Take 1 tablet by mouth daily.     famotidine  (PEPCID ) 40 MG tablet Take 1 tablet (40 mg total) by mouth 2 (two) times daily as needed for heartburn or indigestion. 180 tablet 1   fluticasone -salmeterol (WIXELA INHUB) 100-50 MCG/ACT AEPB Inhale 1 puff into the lungs 2 (two) times daily.     hydrOXYzine (ATARAX) 25 MG tablet Take 25 mg by mouth daily as needed.     levalbuterol  (XOPENEX  HFA) 45 MCG/ACT inhaler Inhale 1-2 puffs into the lungs every 4 (four) hours as needed for wheezing. 1 each 12   midodrine  (PROAMATINE ) 10 MG tablet Take 1 tablet (10 mg total) by mouth 2 (two) times daily as needed (for SBP 100 or less).     ondansetron  (ZOFRAN -ODT) 4 MG disintegrating tablet Take 1 tablet (4 mg total) by mouth every 8 (eight) hours as needed for nausea or vomiting. 20 tablet 0   OVER THE COUNTER MEDICATION Bone Up 1000 mg 2 tabs TID     OVER THE COUNTER MEDICATION B-Right (B-Complex) 1 po daily     OVER THE COUNTER MEDICATION      PARoxetine (PAXIL) 20 MG tablet Take 20 mg by mouth at bedtime.     Probiotic Product (ALIGN) 4 MG CAPS Take 1 capsule by mouth as needed.     RESTASIS 0.05 % ophthalmic emulsion 1 drop 2 (two) times daily.     rosuvastatin  (CRESTOR ) 5 MG tablet TAKE 1 TABLET (5 MG TOTAL) BY MOUTH DAILY FOR CHOLESTEROL 90 tablet 3   traZODone  (DESYREL )  50 MG tablet Take 50 mg by mouth at bedtime.     vitamin C (ASCORBIC ACID) 500 MG tablet Take 500 mg by mouth daily.     vitamin E 180 MG (400 UNITS) capsule Take 400 Units by mouth daily.     [DISCONTINUED] Hyoscyamine -Phenyltoloxamine (DIGEX NF) 0.0625-15 MG CAPS Take one tablet by mouth as needed for abdominal cramping 24 each 0   No current facility-administered medications on file prior to visit.    Review of Systems     Objective:  There were no vitals filed for this visit. BP Readings from Last 3 Encounters:  08/07/23 106/68  08/06/23 112/70  06/30/23 121/69   Wt Readings from Last 3 Encounters:  08/07/23 123 lb (55.8 kg)  08/06/23 125 lb (56.7 kg)  06/30/23 124 lb 12.8 oz (56.6 kg)   There is no height or weight on file to calculate BMI.    Physical Exam         Assessment & Plan:    See Problem List for Assessment and Plan of chronic medical problems.

## 2023-08-24 ENCOUNTER — Ambulatory Visit (INDEPENDENT_AMBULATORY_CARE_PROVIDER_SITE_OTHER)

## 2023-08-24 ENCOUNTER — Telehealth: Payer: Self-pay | Admitting: Internal Medicine

## 2023-08-24 ENCOUNTER — Ambulatory Visit: Admitting: Internal Medicine

## 2023-08-24 ENCOUNTER — Ambulatory Visit: Payer: Self-pay | Admitting: Internal Medicine

## 2023-08-24 ENCOUNTER — Encounter: Payer: Self-pay | Admitting: Internal Medicine

## 2023-08-24 VITALS — BP 104/70 | HR 61 | Temp 97.9°F | Ht 68.0 in | Wt 122.0 lb

## 2023-08-24 DIAGNOSIS — M549 Dorsalgia, unspecified: Secondary | ICD-10-CM

## 2023-08-24 DIAGNOSIS — M25552 Pain in left hip: Secondary | ICD-10-CM | POA: Insufficient documentation

## 2023-08-24 DIAGNOSIS — M542 Cervicalgia: Secondary | ICD-10-CM

## 2023-08-24 DIAGNOSIS — K4090 Unilateral inguinal hernia, without obstruction or gangrene, not specified as recurrent: Secondary | ICD-10-CM | POA: Diagnosis not present

## 2023-08-24 DIAGNOSIS — M545 Low back pain, unspecified: Secondary | ICD-10-CM

## 2023-08-24 DIAGNOSIS — M546 Pain in thoracic spine: Secondary | ICD-10-CM | POA: Diagnosis not present

## 2023-08-24 DIAGNOSIS — R0781 Pleurodynia: Secondary | ICD-10-CM | POA: Diagnosis not present

## 2023-08-24 DIAGNOSIS — S060XAA Concussion with loss of consciousness status unknown, initial encounter: Secondary | ICD-10-CM | POA: Insufficient documentation

## 2023-08-24 DIAGNOSIS — R059 Cough, unspecified: Secondary | ICD-10-CM

## 2023-08-24 MED ORDER — MELOXICAM 15 MG PO TABS: 15.0000 mg | ORAL_TABLET | Freq: Every day | ORAL | 0 refills | Status: AC

## 2023-08-24 NOTE — Assessment & Plan Note (Signed)
 Acute Bilateral posterior rib pain Likely muscle strain or strain in thoracic spine X-rays ordered of thoracic spine and ribs/chest
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
## 2023-08-24 NOTE — Addendum Note (Signed)
 Addended by: GEOFM GLADE PARAS on: 08/24/2023 04:58 PM   Modules accepted: Orders

## 2023-08-24 NOTE — Telephone Encounter (Signed)
 Please let her know her x-ray results-no fractures were identified.  No acute injuries identified.  If he still having pain after the medication I prescribed she should follow-up with orthopedics.
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
## 2023-08-24 NOTE — Assessment & Plan Note (Signed)
 Acute Has has surgery in that hip previously Will check pelvic x-ray and hip x-ray May need to follow-up with orthopedics
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
## 2023-08-25 NOTE — Telephone Encounter (Signed)
 Spoke with patient and she currently has an Ortho doc that she has been seeing so she will follow up with him.

## 2023-08-25 NOTE — Telephone Encounter (Addendum)
 Patient called today stating that [facility] still isn't paying for her Prolia  injection.  I called [employer] and was told that it needed a prior authorization. I explained that [employer] portal would not allow me to submit an auth because it says a Prior shara is not required. I was instructed to call [employer] at [phone] to submit the prior auth.  I was advised to make a claims appeal since I was previously told twice that a prior auth was not required. I will work with [employer] and [employer] on this appeal.  Ref # for today's call, [id number]  Will update.[email or web address]

## 2023-08-25 NOTE — Telephone Encounter (Signed)
 Spoke with patient and results given today.

## 2023-08-26 ENCOUNTER — Telehealth: Payer: Self-pay

## 2023-08-26 ENCOUNTER — Other Ambulatory Visit (HOSPITAL_COMMUNITY)

## 2023-08-26 NOTE — Telephone Encounter (Signed)
 Auth Submission: APPROVED Site of care: Site of care: [facility] Payer: [employer] commercial Medication & CPT/J Code(s) submitted: Prolia  (Denosumab ) [id number] Diagnosis Code:  Route of submission (phone, fax, portal): Phone # Fax # Auth type: Buy/Bill PB Units/visits requested: 60mg  x 2 doses Reference number: [id number] Approval from: 08/26/23 to 08/25/24

## 2023-08-27 ENCOUNTER — Ambulatory Visit: Payer: Self-pay | Admitting: Surgery

## 2023-08-27 ENCOUNTER — Ambulatory Visit: Payer: Self-pay | Admitting: Physician Assistant

## 2023-08-27 LAB — PANCREATIC ELASTASE, FECAL: Pancreatic Elastase-1, Stool: 281 ug/g (ref >200)

## 2023-08-27 NOTE — Progress Notes (Signed)
 Please let patient know that her fecal pancreatic testing was normal.  Thanks, JL L

## 2023-08-28 ENCOUNTER — Telehealth: Payer: Self-pay
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
## 2023-08-31 ENCOUNTER — Ambulatory Visit: Payer: Self-pay | Admitting: Surgery

## 2023-08-31 ENCOUNTER — Encounter: Payer: Self-pay | Admitting: Internal Medicine

## 2023-08-31 ENCOUNTER — Encounter: Admitting: Family Medicine

## 2023-08-31 ENCOUNTER — Encounter: Payer: Self-pay | Admitting: Surgery

## 2023-08-31 VITALS — BP 102/58 | HR 64 | Ht 68.0 in | Wt 123.2 lb

## 2023-08-31 DIAGNOSIS — K4090 Unilateral inguinal hernia, without obstruction or gangrene, not specified as recurrent: Secondary | ICD-10-CM | POA: Diagnosis not present

## 2023-08-31 NOTE — Progress Notes (Addendum)
 08/31/2023  Reason for Visit:  Left inguinal hernia  Requesting Provider:  Glade Hope, MD  History of Present Illness: [name] is a 78 y.o. female presenting for evaluation of a left inguinal hernia.  The patient reports noticing recently a bulging area in the left groin.  She denies worsening pain but does report some discomfort/tenderness in the area.  She reports it's more noticeable when she's standing.  She does report abdominal pain, and is worried that the pain is related to the hernia.  Denies nausea, vomiting, constipation.  Denies any issues in the right groin or umbilicus.  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  Past Medical History: Past Medical History:  Diagnosis Date   Allergy     Anemia    Anxiety    Asthma    Cataract    bil cateracts removed   Complication of anesthesia    Depression    generalized anxiety disorder   Diverticulosis    Duodenal diverticulum    Family history of adverse reaction to anesthesia    Mother and Daughters- N/V   Fibromyalgia    Gastroesophageal reflux disease with hiatal hernia    Hiatal hernia    History of kidney stones    Hyperlipidemia    IBS (irritable bowel syndrome)    Lymphocytic colitis    Dr Jakie   Migraine headache    Mitral valve prolapse    Neuropathy    Osteopenia    BMD ordered by GYN   Osteoporosis    Peripheral neuropathy    treated as RLS by  Neurology   PONV (postoperative nausea and vomiting)    Restless leg syndrome    Schatzki's ring    History of   Sleep apnea    On CPAP, has not been using   Spondylosis      Past Surgical History: Past Surgical History:  Procedure Laterality Date   ANAL RECTAL MANOMETRY N/A 11/14/2015   Procedure: ANO RECTAL MANOMETRY;  Surgeon: Lupita FORBES Commander, MD;  Location: [facility];  Service: Endoscopy;  Laterality: N/A;   BREAST ENHANCEMENT  SURGERY     CATARACT EXTRACTION Bilateral    cataract surgery Left    Dr Camillo   CHOLECYSTECTOMY     COLONOSCOPY     ESOPHAGEAL DILATION     X 2   ROTATOR CUFF REPAIR Left    SINUS ENDO W/FUSION N/A 05/30/2014   Procedure: REVISION  FRONTAL SINUS SURGERY WITH FUSION SCAN;  Surgeon: Alm Bouche, MD;  Location: [facility];  Service: ENT;  Laterality: N/A;   SINUS SURGERY WITH INSTATRAK     x 2   TUBAL LIGATION     UPPER GASTROINTESTINAL ENDOSCOPY     Vaginal cystectomy     x 2   VAGINAL HYSTERECTOMY  05/2007   Vaginal repair, Dr Cary.  Partial  hysterectomy.   WRIST ARTHROSCOPY      Home Medications: Prior to Admission medications   Medication Sig Start Date End Date Taking? Authorizing Provider  Azelastine -Fluticasone  137-50 MCG/ACT SUSP PLACE 1 SPRAY INTO THE NOSE EVERY 12 (TWELVE) HOURS. 06/30/23  Yes Burns, Glade PARAS, MD  budeson-glycopyrrolate -formoterol  (BREZTRI  AEROSPHERE) 160-9-4.8 MCG/ACT AERO Inhale 2 puffs into the lungs 2 (two) times daily. 06/02/23  Yes Burns, Glade PARAS, MD  Cyanocobalamin  (VITAMIN B-12 PO) Take 1 tablet by mouth daily.   Yes [provider]  famotidine  (PEPCID ) 40 MG tablet Take 1 tablet (40 mg total) by mouth 2 (two) times daily as needed for heartburn or indigestion. 08/07/23  Yes Burns, Glade PARAS, MD  fluticasone -salmeterol (WIXELA INHUB) 100-50 MCG/ACT AEPB Inhale 1 puff into the lungs 2 (two) times daily. 08/07/23  Yes Burns, Glade PARAS, MD  hydrOXYzine (ATARAX) 25 MG tablet Take 25 mg by mouth daily as needed. 01/14/23  Yes [provider]  levalbuterol  (XOPENEX  HFA) 45 MCG/ACT inhaler Inhale 1-2 puffs into the lungs every 4 (four) hours as needed for wheezing. 06/02/23  Yes Burns, Glade PARAS, MD  meloxicam  (MOBIC ) 15 MG tablet Take 1 tablet (15 mg total) by mouth daily for 14 days. Take with food 08/24/23 09/07/23 Yes Burns, Glade PARAS, MD  midodrine  (PROAMATINE ) 10 MG tablet Take 1 tablet (10 mg total) by mouth 2 (two) times daily as needed (for SBP 100 or  less). 06/02/23  Yes Burns, Glade PARAS, MD  ondansetron  (ZOFRAN -ODT) 4 MG disintegrating tablet Take 1 tablet (4 mg total) by mouth every 8 (eight) hours as needed for nausea or vomiting. 08/07/23  Yes Burns, Glade PARAS, MD  OVER THE COUNTER MEDICATION Bone Up 1000 mg 2 tabs TID   Yes [provider]  OVER THE COUNTER MEDICATION B-Right (B-Complex) 1 po daily   Yes [provider]  PARoxetine (PAXIL) 20 MG tablet Take 20 mg by mouth at bedtime. 06/02/23  Yes [provider]  Probiotic Product (ALIGN) 4 MG CAPS Take 1 capsule by mouth as needed.   Yes [provider]  RESTASIS 0.05 % ophthalmic emulsion 1 drop 2 (two) times daily. 02/15/21  Yes [provider]  rosuvastatin  (CRESTOR ) 5 MG tablet TAKE 1 TABLET (5 MG TOTAL) BY MOUTH DAILY FOR CHOLESTEROL 07/23/23  Yes Burns, Glade PARAS, MD  traZODone  (DESYREL ) 50 MG tablet Take 50 mg by mouth at bedtime.   Yes [provider]  vitamin C (ASCORBIC ACID) 500 MG tablet Take 500 mg by mouth daily.   Yes [provider]  vitamin E 180 MG (400 UNITS) capsule Take 400 Units by mouth daily.   Yes [provider]  Hyoscyamine -Phenyltoloxamine (DIGEX NF) 0.0625-15 MG CAPS Take one tablet by mouth as needed for abdominal cramping 04/15/11 05/20/11  Jakie Alm SAUNDERS, MD    Allergies: Allergies  Allergen Reactions   Tape Rash   Albuterol      heartburn   Cymbalta  [Duloxetine  Hcl]     Diarrhea, nausea, anxiety worse, shaky   Latex Other (See Comments)   Lyrica  [Pregabalin ] Other (See Comments)    Sedation   Neurontin [Gabapentin] Other (See Comments)    Sedation   Nitrofuran Derivatives Other (See Comments)    tingling   Paxil [Paroxetine Hcl]     Bowel upset, tingling   Prozac  [Fluoxetine  Hcl] Other (See Comments)    Made patient worse and constipation    Social History:  reports that she quit smoking about 25 years ago. Her smoking use included cigarettes. She started smoking about 40  years ago. She has a 7.5 pack-year smoking history. She has been exposed to tobacco smoke. She has never used smokeless tobacco. She reports that she does not drink alcohol and does not use drugs.   Family History: Family History  Problem Relation Age of Onset   Hypertension Father 73   Stroke Father 40   Heart attack Father         ? in 60s   Hypertension Mother  Neuropathy Mother    Anemia Mother    Coronary artery disease Brother        Stent placement in 75s   Diabetes Maternal Uncle    Heart attack Paternal Uncle        SEVEN , ? age   Colon cancer Neg Hx    Seizures Neg Hx    Esophageal cancer Neg Hx    Rectal cancer Neg Hx    Stomach cancer Neg Hx     Review of Systems: Review of Systems  Constitutional:  Negative for chills and fever.  HENT:  Negative for hearing loss.   Respiratory:  Negative for shortness of breath.   Cardiovascular:  Negative for chest pain.  Gastrointestinal:  Positive for abdominal pain. Negative for nausea and vomiting.  Genitourinary:  Negative for dysuria.  Musculoskeletal:  Negative for myalgias.  Skin:  Negative for rash.  Neurological:  Negative for dizziness.  Psychiatric/Behavioral:  Negative for depression.     Physical Exam BP (!) 102/58   Pulse 64   Ht 5' 8 (1.727 m)   Wt 123 lb 3.2 oz (55.9 kg)   SpO2 98%   BMI 18.73 kg/m  CONSTITUTIONAL: No acute distress, well nourished. HEENT:  Normocephalic, atraumatic, extraocular motion intact. NECK: Trachea is midline, and there is no jugular venous distension.  RESPIRATORY:  Lungs are clear, and breath sounds are equal bilaterally. Normal respiratory effort without pathologic use of accessory muscles. CARDIOVASCULAR: Heart is regular without murmurs, gallops, or rubs. GI: The abdomen is soft, non-distended, non-tender to palpation.  The patient does have a small left inguinal hernia which is reducible.  No palpable hernia in the right groin or at the umbilicus.  MUSCULOSKELETAL:   Normal muscle strength and tone in all four extremities.  No peripheral edema or cyanosis. SKIN: Skin turgor is normal. There are no pathologic skin lesions.  NEUROLOGIC:  Motor and sensation is grossly normal.  Cranial nerves are grossly intact. PSYCH:  Alert and oriented to person, place and time. Affect is normal.  Laboratory Analysis: Labs from 06/03/23: Sodium 139, potassium 4.4, chloride 103, CO2 30, BUN 15, creatinine 0.7.  Total bilirubin 0.5, AST 21, ALT 15, alkaline phosphatase 37, albumin 4.6.  WBC 5, hemoglobin 13.2, hematocrit 39.6, platelets 241.  Hemoglobin A1c 5.9.  Imaging: No results found.  Assessment and Plan: This is a 78 y.o. female with a left inguinal hernia.  - Discussed with patient that on exam today, she does have a small left inguinal hernia.  I do not palpate any hernias on the right side.  Discussed with patient that the natural progression for hernias is to get bigger with time and potentially cause symptoms.  The patient reports that she already is having some mild discomfort with the hernia particular when it is bulging.  As such, I think it would be prudent to proceed with repair.  The patient has not had her procedure with Dr. Marea yet and discussed that we could do her hernia repair first.   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   -- Discussed with her then the plan for robotic assisted left inguinal hernia repair, possible bilateral.  Reviewed the surgery at length with her including the planned incisions, risks of bleeding, infection, injury to surrounding structures, the use of mesh, that this will be an outpatient procedure, postoperative activity restrictions, pain control, and she is  willing to proceed. - The patient will discuss with her husband about timing for surgery and she will call us  back to schedule.  All of her questions have been answered.  I spent 55 minutes  dedicated to the care of this patient on the date of this encounter to include pre-visit review of records, face-to-face time with the patient discussing diagnosis and management, and any post-visit coordination of care.   Aloysius Sheree Plant, MD [facility]

## 2023-08-31 NOTE — Patient Instructions (Addendum)
 Do call Dr Fransisca office back to reschedule your procedure.  We can do surgery July 8th, July 10th, or July 15th. Dr Desiderio normally does surgery on Tuesdays and Thursdays.   You have chose to have your hernia repaired. This will be done by Dr. Desiderio at [facility].  Please see your (blue) Pre-care information that you have been given today. Our surgery scheduler will call you to verify surgery date and to go over information.   You will need to arrange to be out of work for approximately 1-2 weeks and then you may return with a lifting restriction for 4 more weeks. If you have FMLA or Disability paperwork that needs to be filled out, please have your company fax your paperwork to [phone] or you may drop this by either office. This paperwork will be filled out within 3 days after your surgery has been completed.  You may have a bruise in your groin and also swelling and brusing in your testicle area. You may use ice 4-5 times daily for 15-20 minutes each time. Make sure that you place a barrier between you and the ice pack. To decrease the swelling, you may roll up a bath towel and place it vertically in between your thighs with your testicles resting on the towel. You will want to keep this area elevated as much as possible for several days following surgery.    Inguinal Hernia, Adult Muscles help keep everything in the body in its proper place. But if a weak spot in the muscles develops, something can poke through. That is called a hernia. When this happens in the lower part of the belly (abdomen), it is called an inguinal hernia. (It takes its name from a part of the body in this region called the inguinal canal.) A weak spot in the wall of muscles lets some fat or part of the small intestine bulge through. An inguinal hernia can develop at any age. Men get them more often than women. CAUSES  In adults, an inguinal hernia develops over time. It can be triggered by: Suddenly straining  the muscles of the lower abdomen. Lifting heavy objects. Straining to have a bowel movement. Difficult bowel movements (constipation) can lead to this. Constant coughing. This may be caused by smoking or lung disease. Being overweight. Being pregnant. Working at a job that requires long periods of standing or heavy lifting. Having had an inguinal hernia before. One type can be an emergency situation. It is called a strangulated inguinal hernia. It develops if part of the small intestine slips through the weak spot and cannot get back into the abdomen. The blood supply can be cut off. If that happens, part of the intestine may die. This situation requires emergency surgery. SYMPTOMS  Often, a small inguinal hernia has no symptoms. It is found when a healthcare provider does a physical exam. Larger hernias usually have symptoms.  In adults, symptoms may include: A lump in the groin. This is easier to see when the person is standing. It might disappear when lying down. In men, a lump in the scrotum. Pain or burning in the groin. This occurs especially when lifting, straining or coughing. A dull ache or feeling of pressure in the groin. Signs of a strangulated hernia can include: A bulge in the groin that becomes very painful and tender to the touch. A bulge that turns red or purple. Fever, nausea and vomiting. Inability to have a bowel movement or to pass gas. DIAGNOSIS  To decide if you have an inguinal hernia, a healthcare provider will probably do a physical examination. This will include asking questions about any symptoms you have noticed. The healthcare provider might feel the groin area and ask you to cough. If an inguinal hernia is felt, the healthcare provider may try to slide it back into the abdomen. Usually no other tests are needed. TREATMENT  Treatments can vary. The size of the hernia makes a difference. Options include: Watchful waiting. This is often suggested if the hernia  is small and you have had no symptoms. No medical procedure will be done unless symptoms develop. You will need to watch closely for symptoms. If any occur, contact your healthcare provider right away. Surgery. This is used if the hernia is larger or you have symptoms. Open surgery. This is usually an outpatient procedure (you will not stay overnight in a hospital). An cut (incision) is made through the skin in the groin. The hernia is put back inside the abdomen. The weak area in the muscles is then repaired by herniorrhaphy or hernioplasty. Herniorrhaphy: in this type of surgery, the weak muscles are sewn back together. Hernioplasty: a patch or mesh is used to close the weak area in the abdominal wall. Laparoscopy. In this procedure, a surgeon makes small incisions. A thin tube with a tiny video camera (called a laparoscope) is put into the abdomen. The surgeon repairs the hernia with mesh by looking with the video camera and using two long instruments. HOME CARE INSTRUCTIONS  After surgery to repair an inguinal hernia: You will need to take pain medicine prescribed by your healthcare provider. Follow all directions carefully. You will need to take care of the wound from the incision. Your activity will be restricted for awhile. This will probably include no heavy lifting for several weeks. You also should not do anything too active for a few weeks. When you can return to work will depend on the type of job that you have. During watchful waiting periods, you should: Maintain a healthy weight. Eat a diet high in fiber (fruits, vegetables and whole grains). Drink plenty of fluids to avoid constipation. This means drinking enough water and other liquids to keep your urine clear or pale yellow. Do not lift heavy objects. Do not stand for long periods of time. Quit smoking. This should keep you from developing a frequent cough. SEEK MEDICAL CARE IF:  A bulge develops in your groin area. You feel  pain, a burning sensation or pressure in the groin. This might be worse if you are lifting or straining. You develop a fever of more than 100.5 F (38.1 C). SEEK IMMEDIATE MEDICAL CARE IF:  Pain in the groin increases suddenly. A bulge in the groin gets bigger suddenly and does not go down. For men, there is sudden pain in the scrotum. Or, the size of the scrotum increases. A bulge in the groin area becomes red or purple and is painful to touch. You have nausea or vomiting that does not go away. You feel your heart beating much faster than normal. You cannot have a bowel movement or pass gas. You develop a fever of more than 102.0 F (38.9 C).   This information is not intended to replace advice given to you by your health care provider. Make sure you discuss any questions you have with your health care provider.   Document Released: 07/06/2008 Document Revised: 05/12/2011 Document Reviewed: 08/21/2014 Elsevier Interactive Patient Education [employer]

## 2023-09-01 ENCOUNTER — Telehealth: Payer: Self-pay | Admitting: Surgery

## 2023-09-01 ENCOUNTER — Ambulatory Visit: Payer: 59

## 2023-09-01 VITALS — BP 123/72 | HR 60 | Temp 98.1°F | Resp 16 | Ht 68.0 in | Wt 124.4 lb

## 2023-09-01 DIAGNOSIS — M810 Age-related osteoporosis without current pathological fracture: Secondary | ICD-10-CM

## 2023-09-01 MED ORDER — SODIUM CHLORIDE 0.9% FLUSH: 10.0000 mL | Freq: Two times a day (BID) | INTRAVENOUS | Status: DC

## 2023-09-01 MED ORDER — DENOSUMAB 60 MG/ML ~~LOC~~ SOSY
60.0000 mg | PREFILLED_SYRINGE | Freq: Once | SUBCUTANEOUS | Status: AC
  Administered 2023-09-01: 60 mg via SUBCUTANEOUS
  Filled 2023-09-01: qty 1

## 2023-09-01 NOTE — Telephone Encounter (Signed)
 Patient has been advised of Pre-Admission date/time, and Surgery date at [facility].  Surgery Date: 09/15/23 Preadmission Testing Date: 09/09/23 (phone 8a-1p)  Patient informed of the scheduling process and surgery information given at time of office visit.   Patient has been made aware to call [phone], between 1-3:00pm the day before surgery, to find out what time to arrive for surgery.

## 2023-09-01 NOTE — Progress Notes (Signed)
 Diagnosis: Osteoporosis  Provider:  Chilton Greathouse MD  Procedure: Injection  Prolia (Denosumab), Dose: 60 mg, Site: subcutaneous, Number of injections: 1  Injection Site(s): Left arm  Post Care:  left arm injection  Discharge: Condition: Good, Destination: Home . AVS Provided  Performed by:  Rico Ala, LPN

## 2023-09-02 NOTE — Telephone Encounter (Signed)
 Message was left for patient on Monday to contact office back.

## 2023-09-07 ENCOUNTER — Telehealth: Payer: Self-pay | Admitting: Surgery
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
## 2023-09-08 ENCOUNTER — Ambulatory Visit: Admitting: Physician Assistant

## 2023-09-08 NOTE — Progress Notes (Unsigned)
 [name] [address] [place] [address] Phone: [phone]  Assessment and Plan:     There are no diagnoses linked to this encounter.  ***    Date of injury was ***.  Symptom severity scores of *** and *** today.  Original symptom severity scores were *** and ***.   Recommendations:  -  Goal of sleeping a minimum of 7-8 continuous hours nightly - Recommend light physical activity for 15-30 minutes a day while keeping symptoms less than 3/10 - Stop mental or physical activities that cause symptoms to worsen greater than 3/10, and wait 24 hours before attempting them again - Eliminate screen time as much as possible for first 48 hours after concussive event, then continue limited screen time (recommend less than 2 hours per day)  Pertinent previous records reviewed include ***    - Encouraged to RTC in *** for reassessment or sooner for any concerns or acute changes    Time of visit *** minutes, which included chart review, physical exam, treatment plan, symptom severity score, VOMS, and tandem gait testing being performed, interpreted, and discussed with patient at today's visit.   Subjective:    Chief Complaint: ***  HPI:   09/08/23 ***   Concussion HPI:  - Injury date: ***   - Mechanism of injury: ***  - LOC: ***  - Initial evaluation: ***  - Previous head injuries/concussions: ***   - Previous imaging: ***    - Social history: Student at ***, activities include ***   Hospitalization for head injury? No*** Diagnosed/treated for headache disorder, migraines, or seizures? No*** Diagnosed with learning disability karlyn? No*** Diagnosed with ADD/ADHD? No*** Diagnose with Depression, anxiety, or other Psychiatric Disorder? No***   Current medications:  Current Outpatient Medications  Medication Sig Dispense Refill   Azelastine -Fluticasone  137-50 MCG/ACT SUSP PLACE 1 SPRAY INTO THE NOSE EVERY 12 (TWELVE) HOURS. 23 g  8   budeson-glycopyrrolate -formoterol  (BREZTRI  AEROSPHERE) 160-9-4.8 MCG/ACT AERO Inhale 2 puffs into the lungs 2 (two) times daily. 10.7 g 11   Cyanocobalamin  (VITAMIN B-12 PO) Take 1 tablet by mouth daily.     famotidine  (PEPCID ) 40 MG tablet Take 1 tablet (40 mg total) by mouth 2 (two) times daily as needed for heartburn or indigestion. 180 tablet 1   fluticasone -salmeterol (WIXELA INHUB) 100-50 MCG/ACT AEPB Inhale 1 puff into the lungs 2 (two) times daily.     hydrOXYzine (ATARAX) 25 MG tablet Take 25 mg by mouth daily as needed.     levalbuterol  (XOPENEX  HFA) 45 MCG/ACT inhaler Inhale 1-2 puffs into the lungs every 4 (four) hours as needed for wheezing. 1 each 12   midodrine  (PROAMATINE ) 10 MG tablet Take 1 tablet (10 mg total) by mouth 2 (two) times daily as needed (for SBP 100 or less).     ondansetron  (ZOFRAN -ODT) 4 MG disintegrating tablet Take 1 tablet (4 mg total) by mouth every 8 (eight) hours as needed for nausea or vomiting. 20 tablet 0   OVER THE COUNTER MEDICATION Bone Up 1000 mg 2 tabs TID     OVER THE COUNTER MEDICATION B-Right (B-Complex) 1 po daily     PARoxetine (PAXIL) 20 MG tablet Take 20 mg by mouth at bedtime.     Probiotic Product (ALIGN) 4 MG CAPS Take 1 capsule by mouth as needed.     RESTASIS 0.05 % ophthalmic emulsion 1 drop 2 (two) times daily.     rosuvastatin  (CRESTOR ) 5 MG tablet TAKE 1  TABLET (5 MG TOTAL) BY MOUTH DAILY FOR CHOLESTEROL 90 tablet 3   traZODone  (DESYREL ) 50 MG tablet Take 50 mg by mouth at bedtime.     vitamin C (ASCORBIC ACID) 500 MG tablet Take 500 mg by mouth daily.     vitamin E 180 MG (400 UNITS) capsule Take 400 Units by mouth daily.     No current facility-administered medications for this visit.      Objective:     There were no vitals filed for this visit.    There is no height or weight on file to calculate BMI.    Physical Exam:     General: Well-appearing, cooperative, sitting comfortably in no acute distress.  Psychiatric:  Mood and affect are appropriate.   Neuro:sensation intact and strength 5/5 with no deficits, no atrophy, normal muscle tone   Today's Symptom Severity Score:  Scores: 0-6  Headache:*** Pressure in head:***  Neck Pain:*** Nausea or vomiting:*** Dizziness:*** Blurred vision:*** Balance problems:*** Sensitivity to light:*** Sensitivity to noise:*** Feeling slowed down:*** Feeling like "in a fog":*** "Don't feel right":*** Difficulty concentrating:*** Difficulty remembering:***  Fatigue or low energy:*** Confusion:***  Drowsiness:***  More emotional:*** Irritability:*** Sadness:***  Nervous or Anxious:*** Trouble falling or staying asleep:***  Total number of symptoms: ***/22  Symptom Severity index: ***/132  Worse with physical activity? No*** Worse with mental activity? No*** Percent improved since injury: ***%    Full pain-free cervical PROM: yes***    Cognitive:  - Months backwards: *** Mistakes. *** seconds  mVOMS:   - Baseline symptoms: *** - Horizontal Vestibular-Ocular Reflex: ***/10  - Smooth pursuits: ***/10  - Horizontal Saccades:  ***/10  - Visual Motion Sensitivity Test:  ***/10  - Convergence: ***cm (<5 cm normal)    Autonomic:  - Symptomatic with supine to standing: No***  Complex Tandem Gait: - Forward, eyes open: *** errors - Backward, eyes open: *** errors - Forward, eyes closed: *** errors - Backward, eyes closed: *** errors  Electronically signed by:  [name] [name] 7:34 AM 09/08/23

## 2023-09-09 ENCOUNTER — Ambulatory Visit: Admitting: Sports Medicine

## 2023-09-09 ENCOUNTER — Other Ambulatory Visit

## 2023-09-09 VITALS — BP 110/76 | HR 61 | Ht 68.0 in | Wt 124.0 lb

## 2023-09-09 DIAGNOSIS — G44319 Acute post-traumatic headache, not intractable: Secondary | ICD-10-CM

## 2023-09-09 DIAGNOSIS — S060X0A Concussion without loss of consciousness, initial encounter: Secondary | ICD-10-CM | POA: Diagnosis not present

## 2023-09-09 DIAGNOSIS — R42 Dizziness and giddiness: Secondary | ICD-10-CM | POA: Diagnosis not present

## 2023-09-09 DIAGNOSIS — F5104 Psychophysiologic insomnia: Secondary | ICD-10-CM

## 2023-09-09 MED ORDER — TRAZODONE HCL 50 MG PO TABS: 50.0000 mg | ORAL_TABLET | Freq: Every day | ORAL | 0 refills | Status: DC

## 2023-09-09 NOTE — Patient Instructions (Addendum)
 Vestibular therapy referral   Start melatonin 5 mg nightly   Trazodone  150 mg nighty   - Goal of sleeping a minimum of 7-8 continuous hours nightly -Recommend light physical activity for 15-30 minutes a day while keeping symptoms less than 3/10 -Stop mental or physical activities that cause symptoms to worsen greater than 3/10, and wait 24 hours before attempting them again -Eliminate screen time as much as possible for first 48 hours after concussive event, then continue limited screen time (recommend less than 2 hours per day)  2 week follow up

## 2023-09-11 ENCOUNTER — Telehealth: Payer: Self-pay

## 2023-09-11 NOTE — Telephone Encounter (Signed)
 Copied from CRM [id number]. Topic: Referral - Question >> Sep 11, 2023 10:34 AM Mercedes MATSU wrote: Reason for CRM: Patient called in asking if she can be recommended a [facility] ENT provider with a referral. Patient is requesting a call back to [phone].

## 2023-09-11 NOTE — Telephone Encounter (Signed)
 Copied from CRM [id number]. Topic: General - Call Back - No Documentation >> Sep 11, 2023 11:43 AM Chasity T wrote: Reason for CRM: Patient is returning a call missed but no documentation has been left. Please return patient call.

## 2023-09-14 NOTE — Telephone Encounter (Signed)
 Message left for patient to return call to clinic.

## 2023-09-19 ENCOUNTER — Telehealth: Payer: Self-pay | Admitting: Physician Assistant

## 2023-09-19 NOTE — Telephone Encounter (Signed)
 Patient called this morning stating that she has been having diarrhea over the past 10 days.  She says she had previously had problems with alternating diarrhea and constipation but has been eating a specific high-fiber cereal which seem to be helping. For the past 10 days she says she has been having some incontinent episodes at nighttime, and/or having a hard time getting to the bathroom in time which has been a mess.  Usually going 1 or 2 times at nighttime and then perhaps once or twice during the daytime. She has been taking Imodium as directed 1 after initial loose bowel movements and then 1 after each loose bowel movement thereafter.  She feels like she needs to be seen urgently and is asking if she can be worked in on Monday.  Advised her to continue Imodium as doing but okay to take 2 at bedtime.  Will also call in prescription for dicyclomine  10 mg 1 p.o. every 6 hours as needed for diarrhea and advised she could take 1 of those doses an hour before bedtime  If she is not able to be worked in early in the week and would benefit from coming to the lab for CBC, BMET  and stool studies  Please call her on Monday

## 2023-09-21 ENCOUNTER — Other Ambulatory Visit

## 2023-09-21 ENCOUNTER — Inpatient Hospital Stay
Admission: RE | Admit: 2023-09-21 | Discharge: 2023-09-21 | Disposition: A | Discharge status: Home / Self Care | Source: Ambulatory Visit

## 2023-09-21 ENCOUNTER — Other Ambulatory Visit (INDEPENDENT_AMBULATORY_CARE_PROVIDER_SITE_OTHER)

## 2023-09-21 ENCOUNTER — Telehealth (INDEPENDENT_AMBULATORY_CARE_PROVIDER_SITE_OTHER): Payer: Self-pay

## 2023-09-21 ENCOUNTER — Telehealth: Payer: Self-pay | Admitting: Surgery

## 2023-09-21 ENCOUNTER — Other Ambulatory Visit: Payer: Self-pay

## 2023-09-21 ENCOUNTER — Ambulatory Visit: Admitting: Family Medicine

## 2023-09-21 DIAGNOSIS — R197 Diarrhea, unspecified: Secondary | ICD-10-CM

## 2023-09-21 HISTORY — DX: Orthostatic hypotension: I95.1

## 2023-09-21 LAB — CBC WITH DIFFERENTIAL/PLATELET
Basophils Absolute: 0 K/uL (ref 0.0–0.1)
Basophils Relative: 0.6 % (ref 0.0–3.0)
Eosinophils Absolute: 0.1 K/uL (ref 0.0–0.7)
Eosinophils Relative: 1.1 % (ref 0.0–5.0)
HCT: 35.8 % — ABNORMAL LOW (ref 36.0–46.0)
Hemoglobin: 12.2 g/dL (ref 12.0–15.0)
Lymphocytes Relative: 12.4 % (ref 12.0–46.0)
Lymphs Abs: 0.9 K/uL (ref 0.7–4.0)
MCHC: 34.1 g/dL (ref 30.0–36.0)
MCV: 89 fl (ref 78.0–100.0)
Monocytes Absolute: 0.6 K/uL (ref 0.1–1.0)
Monocytes Relative: 9.1 % (ref 3.0–12.0)
Neutro Abs: 5.4 K/uL (ref 1.4–7.7)
Neutrophils Relative %: 76.8 % (ref 43.0–77.0)
Platelets: 208 K/uL (ref 150.0–400.0)
RBC: 4.02 Mil/uL (ref 3.87–5.11)
RDW: 14.5 % (ref 11.5–15.5)
WBC: 7 K/uL (ref 4.0–10.5)

## 2023-09-21 LAB — BASIC METABOLIC PANEL WITH GFR
BUN: 8 mg/dL (ref 6–23)
CO2: 27 meq/L (ref 19–32)
Calcium: 8.7 mg/dL (ref 8.4–10.5)
Chloride: 102 meq/L (ref 96–112)
Creatinine, Ser: 0.54 mg/dL (ref 0.40–1.20)
GFR: 87.46 mL/min (ref >60.00)
Glucose, Bld: 96 mg/dL (ref 70–99)
Potassium: 3.1 meq/L — ABNORMAL LOW (ref 3.5–5.1)
Sodium: 138 meq/L (ref 135–145)

## 2023-09-21 NOTE — Progress Notes (Signed)
 SABRA

## 2023-09-21 NOTE — Telephone Encounter (Signed)
 The pt has been advised of the recommendations for labs and stool studies. Orders have been entered.  They will come in today.

## 2023-09-21 NOTE — Telephone Encounter (Signed)
 Her symptoms are vague and not entirely clear with chronic visceral ischemia, but certainly could be related to not getting enough blood flow.  She can come discuss again if she would like with Dr.Dew as the schedule allows

## 2023-09-21 NOTE — Telephone Encounter (Signed)
 Inbound call from patient's husband, requesting an appointment for today. States her symptoms have not improved and would like to follow up on request below.

## 2023-09-21 NOTE — Telephone Encounter (Signed)
 Patient left voicemail about diarrhea and her stomach hurting bad, stated this has been going on for the last 4 weeks and has increasingly gotten worse over the last 2 days.   Spoke with patient and she stated she had cancelled a surgery with our office and she wanted some advice on the prolonged diarrhea and abdominal pain. Patient stated she went to the bathroom 16 times yesterday and is just not feeling well. Her question was is this related to her issue she cancelled surgery for? Imodium does not relieve her symptoms, patient did state her doctor also called in Dicyclomine  and she is taking it with no relief. I did advise patient this is after hours for our office and it would be tomorrow before she heard back from a provider, I did advise patient to go to the emergency department if her condition worsened. Please advise.
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
## 2023-09-21 NOTE — Patient Instructions (Addendum)
 Your procedure is scheduled on: TUESDAY 09/29/23 Report to the Registration Desk on the 1st floor of the [facility]. To find out your arrival time, please call [phone] between 1PM - 3PM on: MONDAY 09/28/23 If your arrival time is 6:00 am, do not arrive before that time as the [facility] entrance doors do not open until 6:00 am.  REMEMBER: Instructions that are not followed completely may result in serious medical risk, up to and including death; or upon the discretion of your surgeon and anesthesiologist your surgery may need to be rescheduled.  Do not eat food after midnight the night before surgery.  No gum chewing or hard candies.  You may however, drink CLEAR liquids up to 2 hours before you are scheduled to arrive for your surgery. Do not drink anything within 2 hours of your scheduled arrival time.  Clear liquids include: - water  - apple juice without pulp - gatorade (not RED colors) - black coffee or tea (Do NOT add milk or creamers to the coffee or tea) Do NOT drink anything that is not on this list.  One week prior to surgery: Stop Anti-inflammatories (NSAIDS) such as Advil, Aleve, Ibuprofen, Motrin, Naproxen, Naprosyn and Aspirin based products such as Excedrin, Goody's Powder, BC Powder.  Stop ANY OVER THE COUNTER supplements until after surgery.  You may however, continue to take Tylenol  if needed for pain up until the day of surgery.  Continue taking all of your other prescription medications up until the day of surgery.  ON THE DAY OF SURGERY ONLY TAKE THESE MEDICATIONS WITH SIPS OF WATER:  famotidine  (PEPCID )  budeson-glycopyrrolate -formoterol  (BREZTRI  AEROSPHERE)  fluticasone -salmeterol (WIXELA INHUB)  levalbuterol  (XOPENEX  HFA)  RESTASIS 0.05 % ophthalmic emulsion  rosuvastatin  (CRESTOR )  midodrine  (PROAMATINE )   Use inhalers on the day of surgery and bring to the hospital.  No Alcohol for 24 hours before or after surgery.  No Smoking including  e-cigarettes for 24 hours before surgery.  No chewable tobacco products for at least 6 hours before surgery.  No nicotine patches on the day of surgery.  Do not use any recreational drugs for at least a week (preferably 2 weeks) before your surgery.  Please be advised that the combination of cocaine and anesthesia may have negative outcomes, up to and including death. If you test positive for cocaine, your surgery will be cancelled.  On the morning of surgery brush your teeth with toothpaste and water, you may rinse your mouth with mouthwash if you wish. Do not swallow any toothpaste or mouthwash.  Use CHG Soap or wipes as directed on instruction sheet.  Do not wear jewelry, make-up, hairpins, clips or nail polish.  For welded (permanent) jewelry: bracelets, anklets, waist bands, etc.  Please have this removed prior to surgery.  If it is not removed, there is a chance that hospital personnel will need to cut it off on the day of surgery.  Do not wear lotions, powders, or perfumes.   Do not shave body hair from the neck down 48 hours before surgery.  Contact lenses, hearing aids and dentures may not be worn into surgery.  Do not bring valuables to the hospital. [facility] is not responsible for any missing/lost belongings or valuables.   Bring your C-PAP to the hospital in case you may have to spend the night.   Notify your doctor if there is any change in your medical condition (cold, fever, infection).  Wear comfortable clothing (specific to your surgery type) to  the hospital.  After surgery, you can help prevent lung complications by doing breathing exercises.  Take deep breaths and cough every 1-2 hours. Your doctor may order a device called an Incentive Spirometer to help you take deep breaths. When coughing or sneezing, hold a pillow firmly against your incision with both hands. This is called "splinting." Doing this helps protect your incision. It also decreases belly  discomfort.  If you are being discharged the day of surgery, you will not be allowed to drive home. You will need a responsible individual to drive you home and stay with you for 24 hours after surgery.   If you are taking public transportation, you will need to have a responsible individual with you.  Please call the Pre-admissions Testing Dept. at [phone] if you have any questions about these instructions.  Surgery Visitation Policy:  Patients having surgery or a procedure may have two visitors.  Children under the age of 83 must have an adult with them who is not the patient.  Merchandiser, retail to address health-related social needs:  [email or web address]     Preparing for Surgery with CHLORHEXIDINE GLUCONATE (CHG) Soap  Chlorhexidine Gluconate (CHG) Soap  o An antiseptic cleaner that kills germs and bonds with the skin to continue killing germs even after washing  o Used for showering the night before surgery and morning of surgery  Before surgery, you can play an important role by reducing the number of germs on your skin.  CHG (Chlorhexidine gluconate) soap is an antiseptic cleanser which kills germs and bonds with the skin to continue killing germs even after washing.  Please do not use if you have an allergy  to CHG or antibacterial soaps. If your skin becomes reddened/irritated stop using the CHG.  1. Shower the NIGHT BEFORE SURGERY and the MORNING OF SURGERY with CHG soap.  2. If you choose to wash your hair, wash your hair first as usual with your normal shampoo.  3. After shampooing, rinse your hair and body thoroughly to remove the shampoo.  4. Use CHG as you would any other liquid soap. You can apply CHG directly to the skin and wash gently with a scrungie or a clean washcloth.  5. Apply the CHG soap to your body only from the neck down. Do not use on open wounds or open sores. Avoid contact with your eyes, ears, mouth, and genitals  (private parts). Wash face and genitals (private parts) with your normal soap.  6. Wash thoroughly, paying special attention to the area where your surgery will be performed.  7. Thoroughly rinse your body with warm water.  8. Do not shower/wash with your normal soap after using and rinsing off the CHG soap.  9. Pat yourself dry with a clean towel.  10. Wear clean pajamas to bed the night before surgery.  12. Place clean sheets on your bed the night of your first shower and do not sleep with pets.  13. Shower again with the CHG soap on the day of surgery prior to arriving at the hospital.  14. Do not apply any deodorants/lotions/powders.  15. Please wear clean clothes to the hospital.

## 2023-09-22 ENCOUNTER — Ambulatory Visit: Payer: Self-pay | Admitting: Internal Medicine

## 2023-09-22 ENCOUNTER — Emergency Department
Admission: EM | Admit: 2023-09-22 | Discharge: 2023-09-23 | Disposition: A | Discharge status: Home / Self Care | Attending: Emergency Medicine | Admitting: Emergency Medicine

## 2023-09-22 ENCOUNTER — Other Ambulatory Visit: Payer: Self-pay

## 2023-09-22 ENCOUNTER — Encounter: Payer: Self-pay | Admitting: Internal Medicine

## 2023-09-22 ENCOUNTER — Ambulatory Visit: Payer: Self-pay

## 2023-09-22 DIAGNOSIS — Z8719 Personal history of other diseases of the digestive system: Secondary | ICD-10-CM | POA: Insufficient documentation

## 2023-09-22 DIAGNOSIS — R197 Diarrhea, unspecified: Secondary | ICD-10-CM | POA: Insufficient documentation

## 2023-09-22 LAB — COMPREHENSIVE METABOLIC PANEL WITH GFR
ALT: 20 U/L (ref 0–44)
AST: 23 U/L (ref 15–41)
Albumin: 3.8 g/dL (ref 3.5–5.0)
Alkaline Phosphatase: 47 U/L (ref 38–126)
Anion gap: 10 (ref 5–15)
BUN: 13 mg/dL (ref 8–23)
CO2: 23 mmol/L (ref 22–32)
Calcium: 8.8 mg/dL — ABNORMAL LOW (ref 8.9–10.3)
Chloride: 106 mmol/L (ref 98–111)
Creatinine, Ser: 0.56 mg/dL (ref 0.44–1.00)
GFR, Estimated: >60 mL/min (ref >60)
Glucose, Bld: 101 mg/dL — ABNORMAL HIGH (ref 70–99)
Potassium: 3 mmol/L — ABNORMAL LOW (ref 3.5–5.1)
Sodium: 139 mmol/L (ref 135–145)
Total Bilirubin: 0.4 mg/dL (ref 0.0–1.2)
Total Protein: 6.6 g/dL (ref 6.5–8.1)

## 2023-09-22 LAB — URINALYSIS, ROUTINE W REFLEX MICROSCOPIC
Bilirubin Urine: NEGATIVE
Glucose, UA: NEGATIVE mg/dL
Hgb urine dipstick: NEGATIVE
Ketones, ur: 20 mg/dL — AB
Leukocytes,Ua: NEGATIVE
Nitrite: NEGATIVE
Protein, ur: NEGATIVE mg/dL
Specific Gravity, Urine: 1.005 (ref 1.005–1.030)
pH: 6 (ref 5.0–8.0)

## 2023-09-22 LAB — LIPASE, BLOOD: Lipase: 27 U/L (ref 11–51)

## 2023-09-22 LAB — CBC
HCT: 35.5 % — ABNORMAL LOW (ref 36.0–46.0)
Hemoglobin: 11.8 g/dL — ABNORMAL LOW (ref 12.0–15.0)
MCH: 30.2 pg (ref 26.0–34.0)
MCHC: 33.2 g/dL (ref 30.0–36.0)
MCV: 90.8 fL (ref 80.0–100.0)
Platelets: 234 K/uL (ref 150–400)
RBC: 3.91 MIL/uL (ref 3.87–5.11)
RDW: 13.9 % (ref 11.5–15.5)
WBC: 7.2 K/uL (ref 4.0–10.5)
nRBC: 0 % (ref 0.0–0.2)

## 2023-09-22 MED ORDER — POTASSIUM CHLORIDE 20 MEQ PO PACK
40.0000 meq | PACK | Freq: Once | ORAL | Status: AC
  Administered 2023-09-22: 40 meq via ORAL
  Filled 2023-09-22: qty 2

## 2023-09-22 MED ORDER — POTASSIUM CHLORIDE CRYS ER 20 MEQ PO TBCR
20.0000 meq | EXTENDED_RELEASE_TABLET | Freq: Every day | ORAL | 0 refills | Status: DC
Start: 2023-09-22 — End: 2023-10-20

## 2023-09-22 NOTE — Telephone Encounter (Signed)
 Noted

## 2023-09-22 NOTE — ED Triage Notes (Signed)
 Pt to ED via POV from home. Pt reports diarrhea x1 month that has become worse the last week. Pt has been seen by GI with blood work showing low K+ and prescription was sent in but has not been filled. Pt reports lower abd pain and epigastric pain.

## 2023-09-22 NOTE — ED Provider Notes (Signed)
 [facility] Provider Note   Event Date/Time   First MD Initiated Contact with Patient 09/22/23 2054     (approximate) History  Diarrhea  HPI [name] is a 78 y.o. female with a past medical history of IBS who presents after being told that her potassium was low at 3.0.  Patient also states that she has had persistent diarrhea over the last few days despite using Bentyl  and Imodium.  Patient had potassium supplementation called into the pharmacy however she has not taken any doses yet and presented to the emergency department due to persistent diarrhea.  Patient has not used any Imodium today but has taken her dicyclomine .  Patient denies any fever, recent travel, sick contacts, or food out of the ordinary ROS: Patient currently denies any vision changes, tinnitus, difficulty speaking, facial droop, sore throat, chest pain, shortness of breath,  nausea/vomiting, dysuria, or weakness/numbness/paresthesias in any extremity   Physical Exam  Triage Vital Signs: ED Triage Vitals  Encounter Vitals Group     BP 09/22/23 1450 114/62     Girls Systolic BP Percentile --      Girls Diastolic BP Percentile --      Boys Systolic BP Percentile --      Boys Diastolic BP Percentile --      Pulse Rate 09/22/23 1450 63     Resp 09/22/23 1450 17     Temp 09/22/23 1450 98.1 F (36.7 C)     Temp Source 09/22/23 1450 Oral     SpO2 09/22/23 1450 98 %     Weight --      Height --      Head Circumference --      Peak Flow --      Pain Score 09/22/23 1451 9     Pain Loc --      Pain Education --      Exclude from Growth Chart --    Most recent vital signs: Vitals:   09/22/23 2252 09/22/23 2253  BP: 135/70   Pulse:  62  Resp:    Temp:    SpO2:  99%   General: Awake, oriented x4. CV:  Good peripheral perfusion. Resp:  Normal effort. Abd:  No distention. Other:  Elderly well-developed, well-nourished Caucasian female resting comfortably in no acute  distress ED Results / Procedures / Treatments  Labs (all labs ordered are listed, but only abnormal results are displayed) Labs Reviewed  COMPREHENSIVE METABOLIC PANEL WITH GFR - Abnormal; Notable for the following components:      Result Value   Potassium 3.0 (*)    Glucose, Bld 101 (*)    Calcium  8.8 (*)    All other components within normal limits  CBC - Abnormal; Notable for the following components:   Hemoglobin 11.8 (*)    HCT 35.5 (*)    All other components within normal limits  GASTROINTESTINAL PANEL BY PCR, STOOL (REPLACES STOOL CULTURE)  LIPASE, BLOOD  URINALYSIS, ROUTINE W REFLEX MICROSCOPIC   PROCEDURES: Critical Care performed: No Procedures MEDICATIONS ORDERED IN ED: Medications  potassium chloride  (KLOR-CON ) packet 40 mEq (40 mEq Oral Given 09/22/23 2125)   IMPRESSION / MDM / ASSESSMENT AND PLAN / ED COURSE  I reviewed the triage vital signs and the nursing notes.                             The patient is on the cardiac monitor to  evaluate for evidence of arrhythmia and/or significant heart rate changes. Patient's presentation is most consistent with acute presentation with potential threat to life or bodily function. 78 year old female presents for diarrhea over the last 4 days in the setting of IBS as well as hypokalemia that was given supplementation in the outpatient setting however patient has not taken any Workup: CBC, BMP, ECG Findings: ECG: shows no concerning T wave morphology, interval prolongation or other findings concerning for severe potassium related changes. Interventions:  40 mEq potassium chloride  p.o.  Likely due to GI losses from diarrhea.  Patient encouraged to continue taking Imodium and Bentyl   Dispo: Discharge home with PCP follow-up    FINAL CLINICAL IMPRESSION(S) / ED DIAGNOSES   Final diagnoses:  History of IBS  Diarrhea, unspecified type   Rx / DC Orders   ED Discharge Orders     None      Note:  This document was  prepared using Dragon voice recognition software and may include unintentional dictation errors.   Faryal Marxen K, MD 09/22/23 2300

## 2023-09-22 NOTE — Telephone Encounter (Signed)
 FYI Only or Action Required?: FYI only for provider.  Patient was last seen in primary care on 08/24/2023 by Geofm Glade PARAS, MD.  Called Nurse Triage reporting No chief complaint on file..  Symptoms began about a month ago.  Interventions attempted: Rest, hydration, or home remedies.  Symptoms are: gradually worsening.  Triage Disposition: Go to ED Now (or PCP Triage)  Patient/caregiver understands and will follow disposition?: Yes  Reason for Disposition  [1] Drinking very little AND [2] dehydration suspected (e.g., no urine > 12 hours, very dry mouth, very lightheaded)  Answer Assessment - Initial Assessment Questions 1. DIARRHEA SEVERITY: How bad is the diarrhea? How many more stools have you had in the past 24 hours than normal?      10+ on average  2. ONSET: When did the diarrhea begin?      About a week  3. STOOL DESCRIPTION:  How loose or watery is the diarrhea? What is the stool color? Is there any blood or mucous in the stool?     Both   4. VOMITING: Are you also vomiting? If Yes, ask: How many times in the past 24 hours?      No  5. ABDOMEN PAIN: Are you having any abdomen pain? If Yes, ask: What does it feel like? (e.g., crampy, dull, intermittent, constant)      Upper and Lower Abdominal Pain, unable to describe  6. ABDOMEN PAIN SEVERITY: If present, ask: How bad is the pain?  (e.g., Scale 1-10; mild, moderate, or severe)     Moderate to severe  7. ORAL INTAKE: If vomiting, Have you been able to drink liquids? How much liquids have you had in the past 24 hours?     Gatorade and Water, eating toast, rice, broth, and boiled potatoes  8. HYDRATION: Any signs of dehydration? (e.g., dry mouth [not just dry lips], too weak to stand, dizziness, new weight loss) When did you last urinate?     Dizziness, Weakness, and Dry Mouth  9. EXPOSURE: Have you traveled to a foreign country recently? Have you been exposed to anyone with diarrhea?  Could you have eaten any food that was spoiled?     No  10. ANTIBIOTIC USE: Are you taking antibiotics now or have you taken antibiotics in the past 2 months?       No  11. OTHER SYMPTOMS: Do you have any other symptoms? (e.g., fever, blood in stool)       Nausea  12. PREGNANCY: Is there any chance you are pregnant? When was your last menstrual period?       No and No  Protocols used: Diarrhea-A-AH

## 2023-09-22 NOTE — Discharge Instructions (Signed)
 You may take your Imodium up to 4 times a day Please continue using potassium supplementation on any days that you are having diarrhea  Your potassium was mildly low when checked today.  Make sure to follow up with a primary doctor to follow up your labs.  Make sure to eat food high in potassium and magnesium  - examples - potatoes, spinach, bananas, beans, avocadoes, oranges, nuts.
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
## 2023-09-22 NOTE — Telephone Encounter (Signed)
 Scheduled patient [facility] appointment to see JD on Tuesday, 09/29/23 to discuss surgery. Patient aware this is not to HAVE surgery. Advised pt to contact PCP or go to the ER regarding diarrhea she is having currently. Patient states she just went yesterday to her GI doctor and they did labs ect and she is waiting for the results. Patient states she has also decided to go to Urgent care today in [place] to make sure she isn't dehydrated.

## 2023-09-22 NOTE — Telephone Encounter (Signed)
 Called patient regarding low potassium. She reports that she has been having diarrhea for several weeks and goes upwards of 16 times per day. Patient reports she is planning to have her husband drive her to either an urgent care or ER for further evaluation. Did send Rx for Potassium. Discussed this with patient as well.

## 2023-09-22 NOTE — Telephone Encounter (Signed)
 Contacted patient at this time in reference to advice from provider, made patient aware she needs to follow up with her PCP or GI in reference to her diarrhea. Also, gave patient the option to come in and follow up with Dr. Marea to discuss her concerns as schedule allows, patient was on board with coming in for an appointment.

## 2023-09-23 ENCOUNTER — Other Ambulatory Visit (INDEPENDENT_AMBULATORY_CARE_PROVIDER_SITE_OTHER)

## 2023-09-23 ENCOUNTER — Ambulatory Visit: Admitting: Gastroenterology

## 2023-09-23 ENCOUNTER — Encounter: Admitting: Sports Medicine

## 2023-09-23 ENCOUNTER — Ambulatory Visit (INDEPENDENT_AMBULATORY_CARE_PROVIDER_SITE_OTHER)
Admission: RE | Admit: 2023-09-23 | Discharge: 2023-09-23 | Disposition: A | Discharge status: Home / Self Care | Source: Ambulatory Visit | Attending: Gastroenterology | Admitting: Gastroenterology

## 2023-09-23 ENCOUNTER — Ambulatory Visit: Payer: Self-pay | Admitting: Gastroenterology

## 2023-09-23 ENCOUNTER — Telehealth: Payer: Self-pay

## 2023-09-23 ENCOUNTER — Encounter: Payer: Self-pay | Admitting: Gastroenterology

## 2023-09-23 VITALS — BP 110/68 | HR 100 | Ht 68.0 in | Wt 118.0 lb

## 2023-09-23 DIAGNOSIS — E876 Hypokalemia: Secondary | ICD-10-CM

## 2023-09-23 DIAGNOSIS — R109 Unspecified abdominal pain: Secondary | ICD-10-CM

## 2023-09-23 DIAGNOSIS — K551 Chronic vascular disorders of intestine: Secondary | ICD-10-CM

## 2023-09-23 DIAGNOSIS — R1084 Generalized abdominal pain: Secondary | ICD-10-CM

## 2023-09-23 DIAGNOSIS — R197 Diarrhea, unspecified: Secondary | ICD-10-CM

## 2023-09-23 DIAGNOSIS — Z8719 Personal history of other diseases of the digestive system: Secondary | ICD-10-CM

## 2023-09-23 LAB — GASTROINTESTINAL PANEL BY PCR, STOOL (REPLACES STOOL CULTURE)

## 2023-09-23 LAB — BASIC METABOLIC PANEL WITH GFR
BUN: 9 mg/dL (ref 6–23)
CO2: 26 meq/L (ref 19–32)
Calcium: 8.9 mg/dL (ref 8.4–10.5)
Chloride: 103 meq/L (ref 96–112)
Creatinine, Ser: 0.6 mg/dL (ref 0.40–1.20)
GFR: 85.27 mL/min (ref >60.00)
Glucose, Bld: 90 mg/dL (ref 70–99)
Potassium: 3.6 meq/L (ref 3.5–5.1)
Sodium: 137 meq/L (ref 135–145)

## 2023-09-23 LAB — CLOSTRIDIUM DIFFICILE BY PCR: Toxigenic C. Difficile by PCR: NEGATIVE

## 2023-09-23 NOTE — ED Provider Notes (Addendum)
 Care assumed of patient from outgoing provider.  See their note for initial history, exam and plan.  Clinical Course as of 09/23/23 0045  Tue Sep 22, 2023  2308 IBS with diarrhea - K mild low at 3 and given replacement.  GI pathogen panel pending. Plan to dc home.  [SM]    Clinical Course User Index [SM] [name], MD   GI pathogen panel was negative.  Discharged home with outpatient follow-up.  [name], MD 09/23/23 [name]    [name], MD 09/23/23 [phone]

## 2023-09-23 NOTE — Patient Instructions (Signed)
 Your provider has requested that you go to the basement level for lab work before leaving today. Press B on the elevator. The lab is located at the first door on the left as you exit the elevator.   Your provider has requested that you have an abdominal x ray before leaving today. Please go to the basement floor to our Radiology department for the test.  You have been scheduled for a CT scan of the abdomen and pelvis at [facility], 1st floor Radiology. You are scheduled on Thursday 09/24/23 at 3 pm. You should arrive 15 minutes prior to your appointment time for registration.    Please follow the written instructions below on the day of your exam:   1) Do not eat anything after 11 am (4 hours prior to your test)   You may take any medications as prescribed with a small amount of water, if necessary. If you take any of the following medications: METFORMIN, GLUCOPHAGE, GLUCOVANCE, AVANDAMET, RIOMET, FORTAMET, ACTOPLUS MET, JANUMET, GLUMETZA or METAGLIP, you MAY be asked to HOLD this medication 48 hours AFTER the exam.   The purpose of you drinking the oral contrast is to aid in the visualization of your intestinal tract. The contrast solution may cause some diarrhea. Depending on your individual set of symptoms, you may also receive an intravenous injection of x-ray contrast/dye. Plan on being at [facility] for 45 minutes or longer, depending on the type of exam you are having performed.   If you have any questions regarding your exam or if you need to reschedule, you may call [facility] at [phone] between the hours of 8:00 am and 5:00 pm, Monday-Friday.

## 2023-09-23 NOTE — Telephone Encounter (Signed)
 I see 4 open APP appointments today so please schedule her for one of those

## 2023-09-23 NOTE — Progress Notes (Signed)
 09/23/2023 [name] [id number] Mar 08, 1945   HISTORY OF PRESENT ILLNESS: This is a 78 year old female who is a patient of Dr. Darilyn.  She was just seen last month by one of our PAs and was reportedly having issues with constipation.  She does have a history of lymphocytic colitis in 2021 and responded well to budesonide  at that time.  Since being seen in June she has started having diarrhea.  Has been feeling poorly for the past month, but definitely much worse over the past week.  Having up to 18 watery stools a day.  Was in the emergency department at [facility] last night.  They describe an extremely poor visit.  They said that the physician never examined her.  They ordered stool studies, but her husband had to collect the stool sample.  Luckily GI pathogen panel was negative and C. difficile was negative 2 days ago.  They did mention a CT scan while she was there, but they never ordered that.  Potassium was low at 3.0 so they gave her some potassium in the ED.  Physician never came back in to speak with them and then the nurse just came in to discharge them home.  She is here today lying in the bed, uncomfortable.  Describes having a lot of abdominal pain.  Her husband describes her stool as being brown water.  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  She keeps saying that she cannot continue to live like this.  Has been tolerating liquids.  Past Medical History:  Diagnosis Date   Allergy     Anemia    Anxiety    Asthma    Cataract    bil cateracts removed   Complication of anesthesia    Depression    generalized anxiety disorder   Diverticulosis    Duodenal diverticulum    Family history of adverse reaction to anesthesia    Mother and Daughters- N/V   Fibromyalgia    Gastroesophageal reflux disease with hiatal hernia    Hiatal hernia    History of kidney stones    Hyperlipidemia     IBS (irritable bowel syndrome)    Lymphocytic colitis    Dr Jakie   Migraine headache    Mitral valve prolapse    Neuropathy    Orthostatic hypotension    Osteopenia    BMD ordered by GYN   Osteoporosis    Peripheral neuropathy    treated as RLS by  Neurology   PONV (postoperative nausea and vomiting)    Restless leg syndrome    Schatzki's ring    History of   Sleep apnea    On CPAP, has not been using   Spondylosis    Past Surgical History:  Procedure Laterality Date   ANAL RECTAL MANOMETRY N/A 11/14/2015   Procedure: ANO RECTAL MANOMETRY;  Surgeon: Lupita FORBES Commander, MD;  Location: [facility];  Service: Endoscopy;  Laterality: N/A;   BREAST ENHANCEMENT SURGERY     CATARACT EXTRACTION Bilateral    cataract surgery Left    Dr Camillo   CHOLECYSTECTOMY     COLONOSCOPY     ESOPHAGEAL DILATION     X 2   ROTATOR CUFF REPAIR Left    SINUS ENDO W/FUSION N/A 05/30/2014   Procedure: REVISION  FRONTAL SINUS SURGERY WITH FUSION SCAN;  Surgeon: Alm Bouche, MD;  Location: [facility] OR;  Service: ENT;  Laterality:  N/A;   SINUS SURGERY WITH INSTATRAK     x 2   TUBAL LIGATION     UPPER GASTROINTESTINAL ENDOSCOPY     Vaginal cystectomy     x 2   VAGINAL HYSTERECTOMY  05/2007   Vaginal repair, Dr Cary.  Partial  hysterectomy.   WRIST ARTHROSCOPY      reports that she quit smoking about 25 years ago. Her smoking use included cigarettes. She started smoking about 40 years ago. She has a 7.5 pack-year smoking history. She has been exposed to tobacco smoke. She has never used smokeless tobacco. She reports that she does not drink alcohol and does not use drugs. family history includes Anemia in her mother; Coronary artery disease in her brother; Diabetes in her maternal uncle; Heart attack in her father and paternal uncle; Hypertension in her mother; Hypertension (age of onset: 17) in her father; Neuropathy in her mother; Stroke (age of onset: 37) in her father. Allergies  Allergen  Reactions   Tape Rash   Albuterol      heartburn   Cymbalta  [Duloxetine  Hcl]     Diarrhea, nausea, anxiety worse, shaky   Latex Other (See Comments)   Lyrica  [Pregabalin ] Other (See Comments)    Sedation   Neurontin [Gabapentin] Other (See Comments)    Sedation   Nitrofuran Derivatives Other (See Comments)    tingling   Paxil [Paroxetine Hcl]     Bowel upset, tingling   Prozac  [Fluoxetine  Hcl] Other (See Comments)    Made patient worse and constipation      Outpatient Encounter Medications as of 09/23/2023  Medication Sig   Azelastine -Fluticasone  137-50 MCG/ACT SUSP PLACE 1 SPRAY INTO THE NOSE EVERY 12 (TWELVE) HOURS.   budeson-glycopyrrolate -formoterol  (BREZTRI  AEROSPHERE) 160-9-4.8 MCG/ACT AERO Inhale 2 puffs into the lungs 2 (two) times daily.   Cyanocobalamin  (VITAMIN B-12 PO) Take 1 tablet by mouth daily.   famotidine  (PEPCID ) 40 MG tablet Take 1 tablet (40 mg total) by mouth 2 (two) times daily as needed for heartburn or indigestion.   fluticasone -salmeterol (WIXELA INHUB) 100-50 MCG/ACT AEPB Inhale 1 puff into the lungs 2 (two) times daily.   hydrOXYzine (ATARAX) 25 MG tablet Take 25 mg by mouth daily as needed.   levalbuterol  (XOPENEX  HFA) 45 MCG/ACT inhaler Inhale 1-2 puffs into the lungs every 4 (four) hours as needed for wheezing.   midodrine  (PROAMATINE ) 10 MG tablet Take 1 tablet (10 mg total) by mouth 2 (two) times daily as needed (for SBP 100 or less).   ondansetron  (ZOFRAN -ODT) 4 MG disintegrating tablet Take 1 tablet (4 mg total) by mouth every 8 (eight) hours as needed for nausea or vomiting.   PARoxetine (PAXIL) 20 MG tablet Take 20 mg by mouth at bedtime.   potassium chloride  SA (KLOR-CON  M) 20 MEQ tablet Take 1 tablet (20 mEq total) by mouth daily.   Probiotic Product (ALIGN) 4 MG CAPS Take 1 capsule by mouth as needed.   RESTASIS 0.05 % ophthalmic emulsion 1 drop 2 (two) times daily.   rosuvastatin  (CRESTOR ) 5 MG tablet TAKE 1 TABLET (5 MG TOTAL) BY MOUTH  DAILY FOR CHOLESTEROL   traZODone  (DESYREL ) 50 MG tablet Take 50 mg by mouth at bedtime.   vitamin C (ASCORBIC ACID) 500 MG tablet Take 500 mg by mouth daily.   vitamin E 180 MG (400 UNITS) capsule Take 400 Units by mouth daily.   OVER THE COUNTER MEDICATION Bone Up 1000 mg 2 tabs TID   OVER THE COUNTER MEDICATION B-Right (B-Complex)  1 po daily   traZODone  (DESYREL ) 50 MG tablet Take 1 tablet (50 mg total) by mouth at bedtime.   [DISCONTINUED] Hyoscyamine -Phenyltoloxamine (DIGEX NF) 0.0625-15 MG CAPS Take one tablet by mouth as needed for abdominal cramping   No facility-administered encounter medications on file as of 09/23/2023.    REVIEW OF SYSTEMS  : All other systems reviewed and negative except where noted in the History of Present Illness.   PHYSICAL EXAM: BP 110/68   Pulse 100   Ht 5' 8 (1.727 m)   Wt 118 lb (53.5 kg)   BMI 17.94 kg/m  General: Well developed white female in no acute distress; lying on bed, uncomfortable Head: Normocephalic and atraumatic Eyes:  Sclerae anicteric, conjunctiva pink. Ears: Normal auditory acuity Lungs: Clear throughout to auscultation; no W/R/R. Heart: Regular rate and rhythm; no M/R/G. Abdomen: Soft, non-distended.  BS present.  Mild diffuse TTP Musculoskeletal: Symmetrical with no gross deformities  Skin: No lesions on visible extremities Extremities: No edema  Neurological: Alert oriented x 4, grossly non-focal Psychological:  Alert and cooperative. Normal mood and affect  ASSESSMENT AND PLAN: *Severe diarrhea and abdominal pain x 1 month, worsening over the past week.  Having diarrhea up to 18 times a day.  In June she reported more issues with constipation and her husband describes previously having very large hard stools that would need to be disimpacted.  Has a history of lymphocytic colitis in 2021 that responded to budesonide .  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  Stool studies negative for  infectious source. *Hypokalemia: Potassium was 3.0 in the ED yesterday.  They did replace it in the ED, but not sent home with any supplement.  -Abdominal x-ray today stat to rule out fecal impaction. - CT angio abdomen and pelvis stat. - If CT is negative then will place her on budesonide  9 mg daily. - BMP today.   CC:  Geofm Glade PARAS, MD

## 2023-09-23 NOTE — Telephone Encounter (Signed)
 Spoke with patient's spouse. He reports that he took patient to [facility] ER yesterday per recommendations from this RN due to patient having around 15-20 diarrhea episodes per day. GI pathogen panel and C-diff were both negative. Spouse reports patient is tolerating Gatorade, however anytime she eats anything it goes right through her Spouse is very disappointed in the care they received from the ER. He states he is having to provide assistance for the patient with ambulation. He is very concerned, and would like patient to be seen. No open slots available.

## 2023-09-23 NOTE — Telephone Encounter (Signed)
 Patient scheduled for appointment for today 09/23/2023 at 1410. Discussed with spouse, he will bring patient in for visit.

## 2023-09-24 ENCOUNTER — Ambulatory Visit (HOSPITAL_BASED_OUTPATIENT_CLINIC_OR_DEPARTMENT_OTHER)
Admission: RE | Admit: 2023-09-24 | Discharge: 2023-09-24 | Disposition: A | Discharge status: Home / Self Care | Source: Ambulatory Visit | Attending: Gastroenterology | Admitting: Gastroenterology

## 2023-09-24 ENCOUNTER — Ambulatory Visit: Admitting: Sleep Medicine

## 2023-09-24 ENCOUNTER — Encounter (HOSPITAL_BASED_OUTPATIENT_CLINIC_OR_DEPARTMENT_OTHER): Payer: Self-pay

## 2023-09-24 DIAGNOSIS — R1084 Generalized abdominal pain: Secondary | ICD-10-CM | POA: Diagnosis present

## 2023-09-24 DIAGNOSIS — K551 Chronic vascular disorders of intestine: Secondary | ICD-10-CM | POA: Diagnosis present

## 2023-09-24 DIAGNOSIS — R197 Diarrhea, unspecified: Secondary | ICD-10-CM | POA: Insufficient documentation

## 2023-09-24 MED ORDER — IOHEXOL 350 MG/ML SOLN
100.0000 mL | Freq: Once | INTRAVENOUS | Status: AC | PRN
  Administered 2023-09-24: 100 mL via INTRAVENOUS

## 2023-09-25 ENCOUNTER — Other Ambulatory Visit: Payer: Self-pay | Admitting: *Deleted

## 2023-09-25 ENCOUNTER — Telehealth: Payer: Self-pay | Admitting: Gastroenterology

## 2023-09-25 MED ORDER — BUDESONIDE 3 MG PO CPEP: 9.0000 mg | ORAL_CAPSULE | Freq: Every day | ORAL | 1 refills | Status: DC

## 2023-09-25 NOTE — Telephone Encounter (Signed)
 Inbound call from patient stating she has been having chronic diarrhea and abd pain was sent to have a Ct scan done but hasn't heard back in regards to results also requesting a call back has a few questions for nurse.  Please advise  Thank you

## 2023-09-25 NOTE — Telephone Encounter (Signed)
 CMA spoke with patient at 08:41 regarding CT scan. Closing encounter.

## 2023-09-27 LAB — GI PROFILE, STOOL, PCR

## 2023-09-27 LAB — SPECIMEN STATUS REPORT

## 2023-09-28 ENCOUNTER — Encounter: Payer: Self-pay | Admitting: Internal Medicine

## 2023-09-28 LAB — GI PROFILE, STOOL, PCR

## 2023-09-29 ENCOUNTER — Ambulatory Visit: Admission: RE | Admit: 2023-09-29 | Source: Home / Self Care | Admitting: Surgery

## 2023-09-29 ENCOUNTER — Encounter (INDEPENDENT_AMBULATORY_CARE_PROVIDER_SITE_OTHER): Payer: Self-pay | Admitting: Vascular Surgery

## 2023-09-29 ENCOUNTER — Encounter: Admission: RE | Payer: Self-pay | Source: Home / Self Care

## 2023-09-29 ENCOUNTER — Ambulatory Visit (INDEPENDENT_AMBULATORY_CARE_PROVIDER_SITE_OTHER): Admitting: Vascular Surgery

## 2023-09-29 VITALS — BP 134/84 | HR 61 | Resp 18 | Wt 119.4 lb

## 2023-09-29 DIAGNOSIS — E782 Mixed hyperlipidemia: Secondary | ICD-10-CM

## 2023-09-29 DIAGNOSIS — K551 Chronic vascular disorders of intestine: Secondary | ICD-10-CM | POA: Insufficient documentation

## 2023-09-29 DIAGNOSIS — I700 Atherosclerosis of aorta: Secondary | ICD-10-CM

## 2023-09-29 SURGERY — HERNIORRHAPHY, INGUINAL, ROBOT-ASSISTED, LAPAROSCOPIC
Anesthesia: General | Site: Inguinal | Laterality: Left

## 2023-09-29 NOTE — H&P (View-Only) (Signed)
 MRN : [id number]  [name] is a 78 y.o. (Apr 25, 1945) female who presents with chief complaint of  Chief Complaint  Patient presents with   Follow-up    Discuss surgery  .  History of Present Illness: Patient returns today in follow up of her mesenteric disease.  She continues to have severe postprandial abdominal pain, weight loss, and inability to eat.  She has been diagnosed with colitis and started on steroids.  She has known severe mesenteric artery stenosis and had another CT scan performed earlier this month which I have independently viewed.  This shows no significant change from the known high-grade mid SMA stenosis.  This is not the typical location of the SMA stenosis although she has some degree of disease at the origin of the SMA.  We had previously recommended angiography with possible intervention but she never followed through with this.  She is now feeling much worse clinically and considering having something done about this.  Her husband accompanies her today.  She remains very anxious.  Current Outpatient Medications  Medication Sig Dispense Refill   Azelastine -Fluticasone  137-50 MCG/ACT SUSP PLACE 1 SPRAY INTO THE NOSE EVERY 12 (TWELVE) HOURS. 23 g 8   budeson-glycopyrrolate -formoterol  (BREZTRI  AEROSPHERE) 160-9-4.8 MCG/ACT AERO Inhale 2 puffs into the lungs 2 (two) times daily. 10.7 g 11   budesonide  (ENTOCORT EC ) 3 MG 24 hr capsule Take 3 capsules (9 mg total) by mouth daily. 270 capsule 1   Cyanocobalamin  (VITAMIN B-12 PO) Take 1 tablet by mouth daily.     famotidine  (PEPCID ) 40 MG tablet Take 1 tablet (40 mg total) by mouth 2 (two) times daily as needed for heartburn or indigestion. 180 tablet 1   fluticasone -salmeterol (WIXELA INHUB) 100-50 MCG/ACT AEPB Inhale 1 puff into the lungs 2 (two) times daily.     hydrOXYzine (ATARAX) 25 MG tablet Take 25 mg by mouth daily as needed.     levalbuterol  (XOPENEX  HFA) 45 MCG/ACT inhaler Inhale 1-2 puffs into  the lungs every 4 (four) hours as needed for wheezing. 1 each 12   midodrine  (PROAMATINE ) 10 MG tablet Take 1 tablet (10 mg total) by mouth 2 (two) times daily as needed (for SBP 100 or less).     ondansetron  (ZOFRAN -ODT) 4 MG disintegrating tablet Take 1 tablet (4 mg total) by mouth every 8 (eight) hours as needed for nausea or vomiting. 20 tablet 0   OVER THE COUNTER MEDICATION Bone Up 1000 mg 2 tabs TID     OVER THE COUNTER MEDICATION B-Right (B-Complex) 1 po daily     PARoxetine (PAXIL) 20 MG tablet Take 20 mg by mouth at bedtime.     potassium chloride  SA (KLOR-CON  M) 20 MEQ tablet Take 1 tablet (20 mEq total) by mouth daily. 30 tablet 0   Probiotic Product (ALIGN) 4 MG CAPS Take 1 capsule by mouth as needed.     RESTASIS 0.05 % ophthalmic emulsion 1 drop 2 (two) times daily.     rosuvastatin  (CRESTOR ) 5 MG tablet TAKE 1 TABLET (5 MG TOTAL) BY MOUTH DAILY FOR CHOLESTEROL 90 tablet 3   traZODone  (DESYREL ) 50 MG tablet Take 50 mg by mouth at bedtime.     traZODone  (DESYREL ) 50 MG tablet Take 1 tablet (50 mg total) by mouth at bedtime. 30 tablet 0   vitamin C (ASCORBIC ACID) 500 MG tablet Take 500 mg by mouth daily.     vitamin E 180 MG (400 UNITS) capsule Take 400 Units by mouth daily.  No current facility-administered medications for this visit.    Past Medical History:  Diagnosis Date   Allergy     Anemia    Anxiety    Asthma    Cataract    bil cateracts removed   Complication of anesthesia    Depression    generalized anxiety disorder   Diverticulosis    Duodenal diverticulum    Family history of adverse reaction to anesthesia    Mother and Daughters- N/V   Fibromyalgia    Gastroesophageal reflux disease with hiatal hernia    Hiatal hernia    History of kidney stones    Hyperlipidemia    IBS (irritable bowel syndrome)    Lymphocytic colitis    Dr Jakie   Migraine headache    Mitral valve prolapse    Neuropathy    Orthostatic hypotension    Osteopenia    BMD  ordered by GYN   Osteoporosis    Peripheral neuropathy    treated as RLS by  Neurology   PONV (postoperative nausea and vomiting)    Restless leg syndrome    Schatzki's ring    History of   Sleep apnea    On CPAP, has not been using   Spondylosis     Past Surgical History:  Procedure Laterality Date   ANAL RECTAL MANOMETRY N/A 11/14/2015   Procedure: ANO RECTAL MANOMETRY;  Surgeon: Lupita FORBES Commander, MD;  Location: [facility];  Service: Endoscopy;  Laterality: N/A;   BREAST ENHANCEMENT SURGERY     CATARACT EXTRACTION Bilateral    cataract surgery Left    Dr Camillo   CHOLECYSTECTOMY     COLONOSCOPY     ESOPHAGEAL DILATION     X 2   ROTATOR CUFF REPAIR Left    SINUS ENDO W/FUSION N/A 05/30/2014   Procedure: REVISION  FRONTAL SINUS SURGERY WITH FUSION SCAN;  Surgeon: Alm Bouche, MD;  Location: [facility] OR;  Service: ENT;  Laterality: N/A;   SINUS SURGERY WITH INSTATRAK     x 2   TUBAL LIGATION     UPPER GASTROINTESTINAL ENDOSCOPY     Vaginal cystectomy     x 2   VAGINAL HYSTERECTOMY  05/2007   Vaginal repair, Dr Cary.  Partial  hysterectomy.   WRIST ARTHROSCOPY       Social History   Tobacco Use   Smoking status: Former    Current packs/day: 0.00    Average packs/day: 0.5 packs/day for 15.0 years (7.5 ttl pk-yrs)    Types: Cigarettes    Start date: 03/04/1983    Quit date: 03/03/1998    Years since quitting: 25.5    Passive exposure: Past   Smokeless tobacco: Never   Tobacco comments:    smoked 1973- 2006, up to <  1  ppd  Vaping Use   Vaping status: Never Used  Substance Use Topics   Alcohol use: No    Alcohol/week: 0.0 standard drinks of alcohol   Drug use: No       Family History  Problem Relation Age of Onset   Hypertension Father 47   Stroke Father 92   Heart attack Father         ? in 69s   Hypertension Mother    Neuropathy Mother    Anemia Mother    Coronary artery disease Brother        Stent placement in 91s   Diabetes Maternal Uncle     Heart attack Paternal Uncle  SEVEN , ? age   Colon cancer Neg Hx    Seizures Neg Hx    Esophageal cancer Neg Hx    Rectal cancer Neg Hx    Stomach cancer Neg Hx      Allergies  Allergen Reactions   Tape Rash   Albuterol      heartburn   Cymbalta  [Duloxetine  Hcl]     Diarrhea, nausea, anxiety worse, shaky   Latex Other (See Comments)   Lyrica  [Pregabalin ] Other (See Comments)    Sedation   Neurontin [Gabapentin] Other (See Comments)    Sedation   Nitrofuran Derivatives Other (See Comments)    tingling   Paxil [Paroxetine Hcl]     Bowel upset, tingling   Prozac  [Fluoxetine  Hcl] Other (See Comments)    Made patient worse and constipation     REVIEW OF SYSTEMS (Negative unless checked)   Constitutional: [x] Weight loss  [] Fever  [] Chills Cardiac: [] Chest pain   [] Chest pressure   [] Palpitations   [] Shortness of breath when laying flat   [] Shortness of breath at rest   [] Shortness of breath with exertion. Vascular:  [] Pain in legs with walking   [] Pain in legs at rest   [] Pain in legs when laying flat   [] Claudication   [] Pain in feet when walking  [] Pain in feet at rest  [] Pain in feet when laying flat   [] History of DVT   [] Phlebitis   [] Swelling in legs   [] Varicose veins   [] Non-healing ulcers Pulmonary:   [] Uses home oxygen   [] Productive cough   [] Hemoptysis   [] Wheeze  [x] COPD   [] Asthma Neurologic:  [] Dizziness  [] Blackouts   [] Seizures   [] History of stroke   [] History of TIA  [] Aphasia   [] Temporary blindness   [] Dysphagia   [] Weakness or numbness in arms   [] Weakness or numbness in legs Musculoskeletal:  [x] Arthritis   [] Joint swelling   [] Joint pain   [] Low back pain Hematologic:  [] Easy bruising  [] Easy bleeding   [] Hypercoagulable state   [x] Anemic  [] Hepatitis Gastrointestinal:  [] Blood in stool   [] Vomiting blood  [x] Gastroesophageal reflux/heartburn   [x] Abdominal pain Genitourinary:  [] Chronic kidney disease   [] Difficult urination  [] Frequent urination   [] Burning with urination   [] Hematuria Skin:  [] Rashes   [] Ulcers   [] Wounds Psychological:  [x] History of anxiety   [x]  History of major depression.   Physical Examination  BP 134/84   Pulse 61   Resp 18   Wt 119 lb 6.4 oz (54.2 kg)   BMI 18.15 kg/m  Gen:   NAD, thin and frail appearing Head: [place]/AT, No temporalis wasting. Ear/Nose/Throat: Hearing grossly intact, nares w/o erythema or drainage Eyes: Conjunctiva clear. Sclera non-icteric Neck: Supple.  Trachea midline Pulmonary:  Good air movement, no use of accessory muscles.  Cardiac: RRR, no JVD Vascular:  Vessel Right Left  Radial Palpable Palpable                                   Gastrointestinal: soft, mildly tender throughout Musculoskeletal: M/S 5/5 throughout.  No deformity or atrophy. No edema. Neurologic: Sensation grossly intact in extremities.  Symmetrical.  Speech is fluent.  Psychiatric: Judgment intact, Mood & affect appropriate for pt's clinical situation. Dermatologic: No rashes or ulcers noted.  No cellulitis or open wounds.      Labs Recent Results (from the past 2160 hours)  Pancreatic elastase, fecal  Status: None   Collection Time: 08/18/23  9:38 AM  Result Value Ref Range   Pancreatic Elastase-1, Stool 281 >200 mcg/g    Comment: . SABRA E-1 mcg/g feces    Interpretation .      <100          Severe exocrine pancreatic                    insufficiency .    100-200         Mild to moderate exocrine                    pancreatic insufficiency .      >200          Normal   Basic metabolic panel with GFR     Status: Abnormal   Collection Time: 09/21/23 10:05 AM  Result Value Ref Range   Sodium 138 135 - 145 mEq/L   Potassium 3.1 (L) 3.5 - 5.1 mEq/L   Chloride 102 96 - 112 mEq/L   CO2 27 19 - 32 mEq/L   Glucose, Bld 96 70 - 99 mg/dL   BUN 8 6 - 23 mg/dL   Creatinine, Ser 9.45 0.40 - 1.20 mg/dL   GFR 12.53 >39.99 mL/min    Comment: Calculated using the CKD-EPI Creatinine Equation  (2021)   Calcium  8.7 8.4 - 10.5 mg/dL  CBC with Differential/Platelet     Status: Abnormal   Collection Time: 09/21/23 10:05 AM  Result Value Ref Range   WBC 7.0 4.0 - 10.5 K/uL   RBC 4.02 3.87 - 5.11 Mil/uL   Hemoglobin 12.2 12.0 - 15.0 g/dL   HCT 64.1 (L) 63.9 - 53.9 %   MCV 89.0 78.0 - 100.0 fl   MCHC 34.1 30.0 - 36.0 g/dL   RDW 85.4 88.4 - 84.4 %   Platelets 208.0 150.0 - 400.0 K/uL   Neutrophils Relative % 76.8 43.0 - 77.0 %   Lymphocytes Relative 12.4 12.0 - 46.0 %   Monocytes Relative 9.1 3.0 - 12.0 %   Eosinophils Relative 1.1 0.0 - 5.0 %   Basophils Relative 0.6 0.0 - 3.0 %   Neutro Abs 5.4 1.4 - 7.7 K/uL   Lymphs Abs 0.9 0.7 - 4.0 K/uL   Monocytes Absolute 0.6 0.1 - 1.0 K/uL   Eosinophils Absolute 0.1 0.0 - 0.7 K/uL   Basophils Absolute 0.0 0.0 - 0.1 K/uL  Clostridium Difficile by PCR     Status: None   Collection Time: 09/21/23  3:06 PM   Specimen: Stool   Stool  Result Value Ref Range   Toxigenic C. Difficile by PCR Negative Negative  Lipase, blood     Status: None   Collection Time: 09/22/23  2:56 PM  Result Value Ref Range   Lipase 27 11 - 51 U/L    Comment: Performed at [facility], [address] [place], [place] [address]  Comprehensive metabolic panel     Status: Abnormal   Collection Time: 09/22/23  2:56 PM  Result Value Ref Range   Sodium 139 135 - 145 mmol/L   Potassium 3.0 (L) 3.5 - 5.1 mmol/L   Chloride 106 98 - 111 mmol/L   CO2 23 22 - 32 mmol/L   Glucose, Bld 101 (H) 70 - 99 mg/dL    Comment: Glucose reference range applies only to samples taken after fasting for at least 8 hours.   BUN 13 8 - 23 mg/dL   Creatinine,  Ser 0.56 0.44 - 1.00 mg/dL   Calcium  8.8 (L) 8.9 - 10.3 mg/dL   Total Protein 6.6 6.5 - 8.1 g/dL   Albumin 3.8 3.5 - 5.0 g/dL   AST 23 15 - 41 U/L   ALT 20 0 - 44 U/L   Alkaline Phosphatase 47 38 - 126 U/L   Total Bilirubin 0.4 0.0 - 1.2 mg/dL   GFR, Estimated >39 >39 mL/min    Comment: (NOTE) Calculated using the  CKD-EPI Creatinine Equation (2021)    Anion gap 10 5 - 15    Comment: Performed at [facility], [address] [place], [place] [address]  CBC     Status: Abnormal   Collection Time: 09/22/23  2:56 PM  Result Value Ref Range   WBC 7.2 4.0 - 10.5 K/uL   RBC 3.91 3.87 - 5.11 MIL/uL   Hemoglobin 11.8 (L) 12.0 - 15.0 g/dL   HCT 64.4 (L) 63.9 - 53.9 %   MCV 90.8 80.0 - 100.0 fL   MCH 30.2 26.0 - 34.0 pg   MCHC 33.2 30.0 - 36.0 g/dL   RDW 86.0 88.4 - 84.4 %   Platelets 234 150 - 400 K/uL   nRBC 0.0 0.0 - 0.2 %    Comment: Performed at [facility], [address] [place], [place] [address]  Gastrointestinal Panel by PCR , Stool     Status: None   Collection Time: 09/22/23  9:26 PM   Specimen: Stool  Result Value Ref Range   Campylobacter species NOT DETECTED NOT DETECTED   Plesimonas shigelloides NOT DETECTED NOT DETECTED   Salmonella species NOT DETECTED NOT DETECTED   Yersinia enterocolitica NOT DETECTED NOT DETECTED   Vibrio species NOT DETECTED NOT DETECTED   Vibrio cholerae NOT DETECTED NOT DETECTED   Enteroaggregative E coli (EAEC) NOT DETECTED NOT DETECTED   Enteropathogenic E coli (EPEC) NOT DETECTED NOT DETECTED   Enterotoxigenic E coli (ETEC) NOT DETECTED NOT DETECTED   Shiga like toxin producing E coli (STEC) NOT DETECTED NOT DETECTED   Shigella/Enteroinvasive E coli (EIEC) NOT DETECTED NOT DETECTED   Cryptosporidium NOT DETECTED NOT DETECTED   Cyclospora cayetanensis NOT DETECTED NOT DETECTED   Entamoeba histolytica NOT DETECTED NOT DETECTED   Giardia lamblia NOT DETECTED NOT DETECTED   Adenovirus F40/41 NOT DETECTED NOT DETECTED   Astrovirus NOT DETECTED NOT DETECTED   Norovirus GI/GII NOT DETECTED NOT DETECTED   Rotavirus A NOT DETECTED NOT DETECTED   Sapovirus (I, II, IV, and V) NOT DETECTED NOT DETECTED    Comment: Performed at [facility], [address] [place], [place] [address]  Urinalysis, Routine w reflex microscopic  -Urine, Clean Catch     Status: Abnormal   Collection Time: 09/22/23 10:52 PM  Result Value Ref Range   Color, Urine STRAW (A) YELLOW   APPearance CLEAR (A) CLEAR   Specific Gravity, Urine 1.005 1.005 - 1.030   pH 6.0 5.0 - 8.0   Glucose, UA NEGATIVE NEGATIVE mg/dL   Hgb urine dipstick NEGATIVE NEGATIVE   Bilirubin Urine NEGATIVE NEGATIVE   Ketones, ur 20 (A) NEGATIVE mg/dL   Protein, ur NEGATIVE NEGATIVE mg/dL   Nitrite NEGATIVE NEGATIVE   Leukocytes,Ua NEGATIVE NEGATIVE    Comment: Performed at [facility], [address], [place], [place] [address]  Basic Metabolic Panel (BMET)     Status: None   Collection Time: 09/23/23  3:51 PM  Result Value Ref Range   Sodium 137 135 - 145  mEq/L   Potassium 3.6 3.5 - 5.1 mEq/L   Chloride 103 96 - 112 mEq/L   CO2 26 19 - 32 mEq/L   Glucose, Bld 90 70 - 99 mg/dL   BUN 9 6 - 23 mg/dL   Creatinine, Ser 9.39 0.40 - 1.20 mg/dL   GFR 14.72 >39.99 mL/min    Comment: Calculated using the CKD-EPI Creatinine Equation (2021)   Calcium  8.9 8.4 - 10.5 mg/dL  GI Profile, Stool, PCR     Status: None   Collection Time: 09/24/23  4:08 PM  Result Value Ref Range   Campylobacter CANCELED     Comment: Test not performed. The required specimen for the test ordered was not received.  Result canceled by the ancillary.   Ova and parasite examination     Status: None (Preliminary result)   Collection Time: 09/24/23  4:08 PM   Stool  Result Value Ref Range   OVA + PARASITE EXAM WILL FOLLOW   Specimen status report     Status: None   Collection Time: 09/24/23  4:08 PM  Result Value Ref Range   specimen status report Comment     Comment: Test Code Change Test Code Change Please note that the Microbiology test code was changed to reflect the specimen source or transport received. Written Authorization Written Authorization Written Authorization Received. Authorization received from DEFAULT DOS 09-26-2023 Logged by Orvil Redburn    Specimen status report     Status: None (Preliminary result)   Collection Time: 09/24/23  4:08 PM  Result Value Ref Range   specimen status report Comment     Comment: No Stool Transport Received    Radiology CT Angio Abd/Pel w/ and/or w/o Result Date: 09/24/2023 CLINICAL DATA:  Abdominal pain and diarrhea. Superior mesenteric artery stenosis. EXAM: CTA ABDOMEN AND PELVIS WITHOUT AND WITH CONTRAST TECHNIQUE: Multidetector CT imaging of the abdomen and pelvis was performed using the standard protocol during bolus administration of intravenous contrast. Multiplanar reconstructed images and MIPs were obtained and reviewed to evaluate the vascular anatomy. RADIATION DOSE REDUCTION: This exam was performed according to the departmental dose-optimization program which includes automated exposure control, adjustment of the mA and/or kV according to patient size and/or use of iterative reconstruction technique. CONTRAST:  OMNIPAQUE  IOHEXOL  350 MG/ML SOLN COMPARISON:  Abdominal CT 11/13/2022. FINDINGS: VASCULAR Aorta: Normal caliber aorta without aneurysm, dissection, vasculitis or significant stenosis. Moderate aortic atherosclerosis. Celiac: Patent without evidence of aneurysm, dissection, vasculitis or significant stenosis. SMA: Origin of the SMA is widely patent. Possible short segment stenosis in the SMA proximally with poststenotic dilatation at 11 mm. 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 Distal to the hepatic artery takeoff short segment chronic dissection when compared to prior exam. Distal to this is high-grade near complete stenosis of the SMA with stenotic segment spanning approximately 15 mm. The vessel is patent distally with patent distal branch vessels. Renals: Beaded appearance of the right and possibly left renal arteries. No severe stenosis. IMA: Patent without evidence of aneurysm, dissection, vasculitis or  significant stenosis. Inflow: Patent without evidence of aneurysm, dissection, vasculitis or significant stenosis. Moderate atheromatous plaque. Proximal Outflow: Bilateral common femoral and visualized portions of the superficial and profunda femoral arteries are patent without evidence of aneurysm, dissection, vasculitis or significant stenosis. Veins: No acute venous findings. The portal, splenic, and mesenteric veins are patent. Review of the MIP images confirms the above findings. NON-VASCULAR Lower chest:  No basilar airspace disease or pleural effusion. Small pericardial effusion, minimally increased from prior. Hepatobiliary: Capsular hypodensity in the anterior left lobe of the liver is too small to characterize. No suspicious liver lesion. Clips in the gallbladder fossa postcholecystectomy. No biliary dilatation. Pancreas: No ductal dilatation or inflammation. Spleen: Normal in size without focal abnormality. Adrenals/Urinary Tract: No adrenal nodule. No hydronephrosis or renal inflammation. Stable 14 mm exophytic lesion from the upper left kidney, previously characterized as benign. Nondistended urinary bladder. Stomach/Bowel: The stomach is nondistended. Majority of the small bowel is decompressed, no definite small bowel wall thickening. There is fluid throughout the colon with pericolonic edema, extending from the ascending colon through the rectum. No pneumatosis. Left colonic diverticulosis without diverticulitis. The appendix is not well demonstrated on the current exam. Lymphatic: No bulky abdominopelvic adenopathy. Reproductive: No adnexal mass. Other: No free air or ascites. Stable fluid density structure adjacent to the right external iliac vessels measuring 17 x 16 mm, series 304, image 60. This needs no further imaging follow-up. Musculoskeletal: Degenerative change of the lumbosacral junction. Postsurgical change in the left hip. No acute osseous findings. IMPRESSION: 1. High-grade short  segment near complete mid superior mesenteric artery stenosis, chronic and not significantly changed from prior exam. 2. Beaded appearance of the right and possibly left renal arteries, can be seen with fibromuscular dysplasia. 3. Fluid throughout the colon with pericolonic edema, extending from the ascending colon through the rectum, consistent with colitis. The length of colonic involvement argues against ischemic etiology. 4. Small pericardial effusion, minimally increased from prior. 5. Additional chronic findings are stable. Aortic Atherosclerosis (ICD10-I70.0). Electronically Signed   By: Andrea Gasman M.D.   On: 09/24/2023 16:27   DG Abd 1 View Result Date: 09/23/2023 CLINICAL DATA:  Severe diarrhea with abdominal pain and cramping. EXAM: ABDOMEN - 1 VIEW COMPARISON:  08/24/2023. FINDINGS: Minimal gaseous prominence of small bowel and colon. Scattered stool in the colon. No unexpected radiopaque calculi. Surgical clips in the right upper quadrant. Trace right pleural effusion. IMPRESSION: 1. No acute findings. 2. Probable trace right pleural effusion. Electronically Signed   By: Newell Eke M.D.   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  This shows no significant change from the known high-grade mid SMA stenosis.  This is not the typical location of the SMA stenosis although she has some degree of disease at the origin of the SMA. The patient seems to be suffering miserably with chronic mesenteric ischemia.  She has postprandial abdominal pain, food fear, and weight loss.  At this point, I would strongly recommend proceeding with angiography with possible revascularization.  I discussed the risks and benefits of the procedure.  The location of the lesion may make this somewhat more challenging, but nonetheless she is highly  symptomatic and I believe her SMA stenosis is largely to blame.  Superior mesenteric artery stenosis [facility]) She has known severe mesenteric artery stenosis and had another CT scan performed earlier this month which I have independently viewed.  This shows no significant change from the known high-grade mid SMA stenosis.  This is not the typical location of the SMA stenosis although she has some degree of disease at the origin of the SMA. The patient seems to be suffering miserably with chronic mesenteric ischemia.  She has postprandial abdominal pain, food fear, and weight loss.  At this point, I would strongly recommend proceeding with angiography with possible revascularization.  I discussed the risks and benefits of the procedure.  The location of the lesion may make this somewhat more challenging, but nonetheless she is highly symptomatic and I believe her SMA stenosis is largely to blame.  Aortic atherosclerosis (HCC) Fairly mild on the CT scan and not currently problematic.   Hyperlipidemia lipid control important in reducing the progression of atherosclerotic disease.  Patient has stopped taking her statin agent.  Recommended she discuss this with her primary care physician and consider restarting.  If we do perform mesenteric intervention, I would want her to be on a statin agent.  Selinda Gu, MD  09/29/2023 6:13 PM    This note was created with Dragon medical transcription system.  Any errors from dictation are purely unintentional

## 2023-09-29 NOTE — Telephone Encounter (Signed)
 Spoke with patient and spouse. Spouse states that patient stopped taking Bentyl  and Imodium after starting Budesonide . He states that patient started having diarrhea again and they would like to know if it is safe for patient to continue taking these medications. Advised that yes patient could continue taking these medications as needed. Patient also wanted to know if she could drink Ensure. States she is very weak and would like to try the Ensure for extra nutrition. Agreed that patient could continue these medications.

## 2023-09-29 NOTE — Assessment & Plan Note (Signed)
 She has known severe mesenteric artery stenosis and had another CT scan performed earlier this month which I have independently viewed.  This shows no significant change from the known high-grade mid SMA stenosis.  This is not the typical location of the SMA stenosis although she has some degree of disease at the origin of the SMA. The patient seems to be suffering miserably with chronic mesenteric ischemia.  She has postprandial abdominal pain, food fear, and weight loss.  At this point, I would strongly recommend proceeding with angiography with possible revascularization.  I discussed the risks and benefits of the procedure.  The location of the lesion may make this somewhat more challenging, but nonetheless she is highly symptomatic and I believe her SMA stenosis is largely to blame.

## 2023-09-29 NOTE — Progress Notes (Signed)
 MRN : [id number]  [name] is a 78 y.o. (Apr 25, 1945) female who presents with chief complaint of  Chief Complaint  Patient presents with   Follow-up    Discuss surgery  .  History of Present Illness: Patient returns today in follow up of her mesenteric disease.  She continues to have severe postprandial abdominal pain, weight loss, and inability to eat.  She has been diagnosed with colitis and started on steroids.  She has known severe mesenteric artery stenosis and had another CT scan performed earlier this month which I have independently viewed.  This shows no significant change from the known high-grade mid SMA stenosis.  This is not the typical location of the SMA stenosis although she has some degree of disease at the origin of the SMA.  We had previously recommended angiography with possible intervention but she never followed through with this.  She is now feeling much worse clinically and considering having something done about this.  Her husband accompanies her today.  She remains very anxious.  Current Outpatient Medications  Medication Sig Dispense Refill   Azelastine -Fluticasone  137-50 MCG/ACT SUSP PLACE 1 SPRAY INTO THE NOSE EVERY 12 (TWELVE) HOURS. 23 g 8   budeson-glycopyrrolate -formoterol  (BREZTRI  AEROSPHERE) 160-9-4.8 MCG/ACT AERO Inhale 2 puffs into the lungs 2 (two) times daily. 10.7 g 11   budesonide  (ENTOCORT EC ) 3 MG 24 hr capsule Take 3 capsules (9 mg total) by mouth daily. 270 capsule 1   Cyanocobalamin  (VITAMIN B-12 PO) Take 1 tablet by mouth daily.     famotidine  (PEPCID ) 40 MG tablet Take 1 tablet (40 mg total) by mouth 2 (two) times daily as needed for heartburn or indigestion. 180 tablet 1   fluticasone -salmeterol (WIXELA INHUB) 100-50 MCG/ACT AEPB Inhale 1 puff into the lungs 2 (two) times daily.     hydrOXYzine (ATARAX) 25 MG tablet Take 25 mg by mouth daily as needed.     levalbuterol  (XOPENEX  HFA) 45 MCG/ACT inhaler Inhale 1-2 puffs into  the lungs every 4 (four) hours as needed for wheezing. 1 each 12   midodrine  (PROAMATINE ) 10 MG tablet Take 1 tablet (10 mg total) by mouth 2 (two) times daily as needed (for SBP 100 or less).     ondansetron  (ZOFRAN -ODT) 4 MG disintegrating tablet Take 1 tablet (4 mg total) by mouth every 8 (eight) hours as needed for nausea or vomiting. 20 tablet 0   OVER THE COUNTER MEDICATION Bone Up 1000 mg 2 tabs TID     OVER THE COUNTER MEDICATION B-Right (B-Complex) 1 po daily     PARoxetine (PAXIL) 20 MG tablet Take 20 mg by mouth at bedtime.     potassium chloride  SA (KLOR-CON  M) 20 MEQ tablet Take 1 tablet (20 mEq total) by mouth daily. 30 tablet 0   Probiotic Product (ALIGN) 4 MG CAPS Take 1 capsule by mouth as needed.     RESTASIS 0.05 % ophthalmic emulsion 1 drop 2 (two) times daily.     rosuvastatin  (CRESTOR ) 5 MG tablet TAKE 1 TABLET (5 MG TOTAL) BY MOUTH DAILY FOR CHOLESTEROL 90 tablet 3   traZODone  (DESYREL ) 50 MG tablet Take 50 mg by mouth at bedtime.     traZODone  (DESYREL ) 50 MG tablet Take 1 tablet (50 mg total) by mouth at bedtime. 30 tablet 0   vitamin C (ASCORBIC ACID) 500 MG tablet Take 500 mg by mouth daily.     vitamin E 180 MG (400 UNITS) capsule Take 400 Units by mouth daily.  No current facility-administered medications for this visit.    Past Medical History:  Diagnosis Date   Allergy     Anemia    Anxiety    Asthma    Cataract    bil cateracts removed   Complication of anesthesia    Depression    generalized anxiety disorder   Diverticulosis    Duodenal diverticulum    Family history of adverse reaction to anesthesia    Mother and Daughters- N/V   Fibromyalgia    Gastroesophageal reflux disease with hiatal hernia    Hiatal hernia    History of kidney stones    Hyperlipidemia    IBS (irritable bowel syndrome)    Lymphocytic colitis    Dr Jakie   Migraine headache    Mitral valve prolapse    Neuropathy    Orthostatic hypotension    Osteopenia    BMD  ordered by GYN   Osteoporosis    Peripheral neuropathy    treated as RLS by  Neurology   PONV (postoperative nausea and vomiting)    Restless leg syndrome    Schatzki's ring    History of   Sleep apnea    On CPAP, has not been using   Spondylosis     Past Surgical History:  Procedure Laterality Date   ANAL RECTAL MANOMETRY N/A 11/14/2015   Procedure: ANO RECTAL MANOMETRY;  Surgeon: Lupita FORBES Commander, MD;  Location: [facility];  Service: Endoscopy;  Laterality: N/A;   BREAST ENHANCEMENT SURGERY     CATARACT EXTRACTION Bilateral    cataract surgery Left    Dr Camillo   CHOLECYSTECTOMY     COLONOSCOPY     ESOPHAGEAL DILATION     X 2   ROTATOR CUFF REPAIR Left    SINUS ENDO W/FUSION N/A 05/30/2014   Procedure: REVISION  FRONTAL SINUS SURGERY WITH FUSION SCAN;  Surgeon: Alm Bouche, MD;  Location: [facility] OR;  Service: ENT;  Laterality: N/A;   SINUS SURGERY WITH INSTATRAK     x 2   TUBAL LIGATION     UPPER GASTROINTESTINAL ENDOSCOPY     Vaginal cystectomy     x 2   VAGINAL HYSTERECTOMY  05/2007   Vaginal repair, Dr Cary.  Partial  hysterectomy.   WRIST ARTHROSCOPY       Social History   Tobacco Use   Smoking status: Former    Current packs/day: 0.00    Average packs/day: 0.5 packs/day for 15.0 years (7.5 ttl pk-yrs)    Types: Cigarettes    Start date: 03/04/1983    Quit date: 03/03/1998    Years since quitting: 25.5    Passive exposure: Past   Smokeless tobacco: Never   Tobacco comments:    smoked 1973- 2006, up to <  1  ppd  Vaping Use   Vaping status: Never Used  Substance Use Topics   Alcohol use: No    Alcohol/week: 0.0 standard drinks of alcohol   Drug use: No       Family History  Problem Relation Age of Onset   Hypertension Father 47   Stroke Father 92   Heart attack Father         ? in 69s   Hypertension Mother    Neuropathy Mother    Anemia Mother    Coronary artery disease Brother        Stent placement in 91s   Diabetes Maternal Uncle     Heart attack Paternal Uncle  SEVEN , ? age   Colon cancer Neg Hx    Seizures Neg Hx    Esophageal cancer Neg Hx    Rectal cancer Neg Hx    Stomach cancer Neg Hx      Allergies  Allergen Reactions   Tape Rash   Albuterol      heartburn   Cymbalta  [Duloxetine  Hcl]     Diarrhea, nausea, anxiety worse, shaky   Latex Other (See Comments)   Lyrica  [Pregabalin ] Other (See Comments)    Sedation   Neurontin [Gabapentin] Other (See Comments)    Sedation   Nitrofuran Derivatives Other (See Comments)    tingling   Paxil [Paroxetine Hcl]     Bowel upset, tingling   Prozac  [Fluoxetine  Hcl] Other (See Comments)    Made patient worse and constipation     REVIEW OF SYSTEMS (Negative unless checked)   Constitutional: [x] Weight loss  [] Fever  [] Chills Cardiac: [] Chest pain   [] Chest pressure   [] Palpitations   [] Shortness of breath when laying flat   [] Shortness of breath at rest   [] Shortness of breath with exertion. Vascular:  [] Pain in legs with walking   [] Pain in legs at rest   [] Pain in legs when laying flat   [] Claudication   [] Pain in feet when walking  [] Pain in feet at rest  [] Pain in feet when laying flat   [] History of DVT   [] Phlebitis   [] Swelling in legs   [] Varicose veins   [] Non-healing ulcers Pulmonary:   [] Uses home oxygen   [] Productive cough   [] Hemoptysis   [] Wheeze  [x] COPD   [] Asthma Neurologic:  [] Dizziness  [] Blackouts   [] Seizures   [] History of stroke   [] History of TIA  [] Aphasia   [] Temporary blindness   [] Dysphagia   [] Weakness or numbness in arms   [] Weakness or numbness in legs Musculoskeletal:  [x] Arthritis   [] Joint swelling   [] Joint pain   [] Low back pain Hematologic:  [] Easy bruising  [] Easy bleeding   [] Hypercoagulable state   [x] Anemic  [] Hepatitis Gastrointestinal:  [] Blood in stool   [] Vomiting blood  [x] Gastroesophageal reflux/heartburn   [x] Abdominal pain Genitourinary:  [] Chronic kidney disease   [] Difficult urination  [] Frequent urination   [] Burning with urination   [] Hematuria Skin:  [] Rashes   [] Ulcers   [] Wounds Psychological:  [x] History of anxiety   [x]  History of major depression.   Physical Examination  BP 134/84   Pulse 61   Resp 18   Wt 119 lb 6.4 oz (54.2 kg)   BMI 18.15 kg/m  Gen:   NAD, thin and frail appearing Head: [place]/AT, No temporalis wasting. Ear/Nose/Throat: Hearing grossly intact, nares w/o erythema or drainage Eyes: Conjunctiva clear. Sclera non-icteric Neck: Supple.  Trachea midline Pulmonary:  Good air movement, no use of accessory muscles.  Cardiac: RRR, no JVD Vascular:  Vessel Right Left  Radial Palpable Palpable                                   Gastrointestinal: soft, mildly tender throughout Musculoskeletal: M/S 5/5 throughout.  No deformity or atrophy. No edema. Neurologic: Sensation grossly intact in extremities.  Symmetrical.  Speech is fluent.  Psychiatric: Judgment intact, Mood & affect appropriate for pt's clinical situation. Dermatologic: No rashes or ulcers noted.  No cellulitis or open wounds.      Labs Recent Results (from the past 2160 hours)  Pancreatic elastase, fecal  Status: None   Collection Time: 08/18/23  9:38 AM  Result Value Ref Range   Pancreatic Elastase-1, Stool 281 >200 mcg/g    Comment: . SABRA E-1 mcg/g feces    Interpretation .      <100          Severe exocrine pancreatic                    insufficiency .    100-200         Mild to moderate exocrine                    pancreatic insufficiency .      >200          Normal   Basic metabolic panel with GFR     Status: Abnormal   Collection Time: 09/21/23 10:05 AM  Result Value Ref Range   Sodium 138 135 - 145 mEq/L   Potassium 3.1 (L) 3.5 - 5.1 mEq/L   Chloride 102 96 - 112 mEq/L   CO2 27 19 - 32 mEq/L   Glucose, Bld 96 70 - 99 mg/dL   BUN 8 6 - 23 mg/dL   Creatinine, Ser 9.45 0.40 - 1.20 mg/dL   GFR 12.53 >39.99 mL/min    Comment: Calculated using the CKD-EPI Creatinine Equation  (2021)   Calcium  8.7 8.4 - 10.5 mg/dL  CBC with Differential/Platelet     Status: Abnormal   Collection Time: 09/21/23 10:05 AM  Result Value Ref Range   WBC 7.0 4.0 - 10.5 K/uL   RBC 4.02 3.87 - 5.11 Mil/uL   Hemoglobin 12.2 12.0 - 15.0 g/dL   HCT 64.1 (L) 63.9 - 53.9 %   MCV 89.0 78.0 - 100.0 fl   MCHC 34.1 30.0 - 36.0 g/dL   RDW 85.4 88.4 - 84.4 %   Platelets 208.0 150.0 - 400.0 K/uL   Neutrophils Relative % 76.8 43.0 - 77.0 %   Lymphocytes Relative 12.4 12.0 - 46.0 %   Monocytes Relative 9.1 3.0 - 12.0 %   Eosinophils Relative 1.1 0.0 - 5.0 %   Basophils Relative 0.6 0.0 - 3.0 %   Neutro Abs 5.4 1.4 - 7.7 K/uL   Lymphs Abs 0.9 0.7 - 4.0 K/uL   Monocytes Absolute 0.6 0.1 - 1.0 K/uL   Eosinophils Absolute 0.1 0.0 - 0.7 K/uL   Basophils Absolute 0.0 0.0 - 0.1 K/uL  Clostridium Difficile by PCR     Status: None   Collection Time: 09/21/23  3:06 PM   Specimen: Stool   Stool  Result Value Ref Range   Toxigenic C. Difficile by PCR Negative Negative  Lipase, blood     Status: None   Collection Time: 09/22/23  2:56 PM  Result Value Ref Range   Lipase 27 11 - 51 U/L    Comment: Performed at [facility], [address] [place], [place] [address]  Comprehensive metabolic panel     Status: Abnormal   Collection Time: 09/22/23  2:56 PM  Result Value Ref Range   Sodium 139 135 - 145 mmol/L   Potassium 3.0 (L) 3.5 - 5.1 mmol/L   Chloride 106 98 - 111 mmol/L   CO2 23 22 - 32 mmol/L   Glucose, Bld 101 (H) 70 - 99 mg/dL    Comment: Glucose reference range applies only to samples taken after fasting for at least 8 hours.   BUN 13 8 - 23 mg/dL   Creatinine,  Ser 0.56 0.44 - 1.00 mg/dL   Calcium  8.8 (L) 8.9 - 10.3 mg/dL   Total Protein 6.6 6.5 - 8.1 g/dL   Albumin 3.8 3.5 - 5.0 g/dL   AST 23 15 - 41 U/L   ALT 20 0 - 44 U/L   Alkaline Phosphatase 47 38 - 126 U/L   Total Bilirubin 0.4 0.0 - 1.2 mg/dL   GFR, Estimated >39 >39 mL/min    Comment: (NOTE) Calculated using the  CKD-EPI Creatinine Equation (2021)    Anion gap 10 5 - 15    Comment: Performed at [facility], [address] [place], [place] [address]  CBC     Status: Abnormal   Collection Time: 09/22/23  2:56 PM  Result Value Ref Range   WBC 7.2 4.0 - 10.5 K/uL   RBC 3.91 3.87 - 5.11 MIL/uL   Hemoglobin 11.8 (L) 12.0 - 15.0 g/dL   HCT 64.4 (L) 63.9 - 53.9 %   MCV 90.8 80.0 - 100.0 fL   MCH 30.2 26.0 - 34.0 pg   MCHC 33.2 30.0 - 36.0 g/dL   RDW 86.0 88.4 - 84.4 %   Platelets 234 150 - 400 K/uL   nRBC 0.0 0.0 - 0.2 %    Comment: Performed at [facility], [address] [place], [place] [address]  Gastrointestinal Panel by PCR , Stool     Status: None   Collection Time: 09/22/23  9:26 PM   Specimen: Stool  Result Value Ref Range   Campylobacter species NOT DETECTED NOT DETECTED   Plesimonas shigelloides NOT DETECTED NOT DETECTED   Salmonella species NOT DETECTED NOT DETECTED   Yersinia enterocolitica NOT DETECTED NOT DETECTED   Vibrio species NOT DETECTED NOT DETECTED   Vibrio cholerae NOT DETECTED NOT DETECTED   Enteroaggregative E coli (EAEC) NOT DETECTED NOT DETECTED   Enteropathogenic E coli (EPEC) NOT DETECTED NOT DETECTED   Enterotoxigenic E coli (ETEC) NOT DETECTED NOT DETECTED   Shiga like toxin producing E coli (STEC) NOT DETECTED NOT DETECTED   Shigella/Enteroinvasive E coli (EIEC) NOT DETECTED NOT DETECTED   Cryptosporidium NOT DETECTED NOT DETECTED   Cyclospora cayetanensis NOT DETECTED NOT DETECTED   Entamoeba histolytica NOT DETECTED NOT DETECTED   Giardia lamblia NOT DETECTED NOT DETECTED   Adenovirus F40/41 NOT DETECTED NOT DETECTED   Astrovirus NOT DETECTED NOT DETECTED   Norovirus GI/GII NOT DETECTED NOT DETECTED   Rotavirus A NOT DETECTED NOT DETECTED   Sapovirus (I, II, IV, and V) NOT DETECTED NOT DETECTED    Comment: Performed at [facility], [address] [place], [place] [address]  Urinalysis, Routine w reflex microscopic  -Urine, Clean Catch     Status: Abnormal   Collection Time: 09/22/23 10:52 PM  Result Value Ref Range   Color, Urine STRAW (A) YELLOW   APPearance CLEAR (A) CLEAR   Specific Gravity, Urine 1.005 1.005 - 1.030   pH 6.0 5.0 - 8.0   Glucose, UA NEGATIVE NEGATIVE mg/dL   Hgb urine dipstick NEGATIVE NEGATIVE   Bilirubin Urine NEGATIVE NEGATIVE   Ketones, ur 20 (A) NEGATIVE mg/dL   Protein, ur NEGATIVE NEGATIVE mg/dL   Nitrite NEGATIVE NEGATIVE   Leukocytes,Ua NEGATIVE NEGATIVE    Comment: Performed at [facility], [address], [place], [place] [address]  Basic Metabolic Panel (BMET)     Status: None   Collection Time: 09/23/23  3:51 PM  Result Value Ref Range   Sodium 137 135 - 145  mEq/L   Potassium 3.6 3.5 - 5.1 mEq/L   Chloride 103 96 - 112 mEq/L   CO2 26 19 - 32 mEq/L   Glucose, Bld 90 70 - 99 mg/dL   BUN 9 6 - 23 mg/dL   Creatinine, Ser 9.39 0.40 - 1.20 mg/dL   GFR 14.72 >39.99 mL/min    Comment: Calculated using the CKD-EPI Creatinine Equation (2021)   Calcium  8.9 8.4 - 10.5 mg/dL  GI Profile, Stool, PCR     Status: None   Collection Time: 09/24/23  4:08 PM  Result Value Ref Range   Campylobacter CANCELED     Comment: Test not performed. The required specimen for the test ordered was not received.  Result canceled by the ancillary.   Ova and parasite examination     Status: None (Preliminary result)   Collection Time: 09/24/23  4:08 PM   Stool  Result Value Ref Range   OVA + PARASITE EXAM WILL FOLLOW   Specimen status report     Status: None   Collection Time: 09/24/23  4:08 PM  Result Value Ref Range   specimen status report Comment     Comment: Test Code Change Test Code Change Please note that the Microbiology test code was changed to reflect the specimen source or transport received. Written Authorization Written Authorization Written Authorization Received. Authorization received from DEFAULT DOS 09-26-2023 Logged by Orvil Redburn    Specimen status report     Status: None (Preliminary result)   Collection Time: 09/24/23  4:08 PM  Result Value Ref Range   specimen status report Comment     Comment: No Stool Transport Received    Radiology CT Angio Abd/Pel w/ and/or w/o Result Date: 09/24/2023 CLINICAL DATA:  Abdominal pain and diarrhea. Superior mesenteric artery stenosis. EXAM: CTA ABDOMEN AND PELVIS WITHOUT AND WITH CONTRAST TECHNIQUE: Multidetector CT imaging of the abdomen and pelvis was performed using the standard protocol during bolus administration of intravenous contrast. Multiplanar reconstructed images and MIPs were obtained and reviewed to evaluate the vascular anatomy. RADIATION DOSE REDUCTION: This exam was performed according to the departmental dose-optimization program which includes automated exposure control, adjustment of the mA and/or kV according to patient size and/or use of iterative reconstruction technique. CONTRAST:  OMNIPAQUE  IOHEXOL  350 MG/ML SOLN COMPARISON:  Abdominal CT 11/13/2022. FINDINGS: VASCULAR Aorta: Normal caliber aorta without aneurysm, dissection, vasculitis or significant stenosis. Moderate aortic atherosclerosis. Celiac: Patent without evidence of aneurysm, dissection, vasculitis or significant stenosis. SMA: Origin of the SMA is widely patent. Possible short segment stenosis in the SMA proximally with poststenotic dilatation at 11 mm. 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 Distal to the hepatic artery takeoff short segment chronic dissection when compared to prior exam. Distal to this is high-grade near complete stenosis of the SMA with stenotic segment spanning approximately 15 mm. The vessel is patent distally with patent distal branch vessels. Renals: Beaded appearance of the right and possibly left renal arteries. No severe stenosis. IMA: Patent without evidence of aneurysm, dissection, vasculitis or  significant stenosis. Inflow: Patent without evidence of aneurysm, dissection, vasculitis or significant stenosis. Moderate atheromatous plaque. Proximal Outflow: Bilateral common femoral and visualized portions of the superficial and profunda femoral arteries are patent without evidence of aneurysm, dissection, vasculitis or significant stenosis. Veins: No acute venous findings. The portal, splenic, and mesenteric veins are patent. Review of the MIP images confirms the above findings. NON-VASCULAR Lower chest:  No basilar airspace disease or pleural effusion. Small pericardial effusion, minimally increased from prior. Hepatobiliary: Capsular hypodensity in the anterior left lobe of the liver is too small to characterize. No suspicious liver lesion. Clips in the gallbladder fossa postcholecystectomy. No biliary dilatation. Pancreas: No ductal dilatation or inflammation. Spleen: Normal in size without focal abnormality. Adrenals/Urinary Tract: No adrenal nodule. No hydronephrosis or renal inflammation. Stable 14 mm exophytic lesion from the upper left kidney, previously characterized as benign. Nondistended urinary bladder. Stomach/Bowel: The stomach is nondistended. Majority of the small bowel is decompressed, no definite small bowel wall thickening. There is fluid throughout the colon with pericolonic edema, extending from the ascending colon through the rectum. No pneumatosis. Left colonic diverticulosis without diverticulitis. The appendix is not well demonstrated on the current exam. Lymphatic: No bulky abdominopelvic adenopathy. Reproductive: No adnexal mass. Other: No free air or ascites. Stable fluid density structure adjacent to the right external iliac vessels measuring 17 x 16 mm, series 304, image 60. This needs no further imaging follow-up. Musculoskeletal: Degenerative change of the lumbosacral junction. Postsurgical change in the left hip. No acute osseous findings. IMPRESSION: 1. High-grade short  segment near complete mid superior mesenteric artery stenosis, chronic and not significantly changed from prior exam. 2. Beaded appearance of the right and possibly left renal arteries, can be seen with fibromuscular dysplasia. 3. Fluid throughout the colon with pericolonic edema, extending from the ascending colon through the rectum, consistent with colitis. The length of colonic involvement argues against ischemic etiology. 4. Small pericardial effusion, minimally increased from prior. 5. Additional chronic findings are stable. Aortic Atherosclerosis (ICD10-I70.0). Electronically Signed   By: Andrea Gasman M.D.   On: 09/24/2023 16:27   DG Abd 1 View Result Date: 09/23/2023 CLINICAL DATA:  Severe diarrhea with abdominal pain and cramping. EXAM: ABDOMEN - 1 VIEW COMPARISON:  08/24/2023. FINDINGS: Minimal gaseous prominence of small bowel and colon. Scattered stool in the colon. No unexpected radiopaque calculi. Surgical clips in the right upper quadrant. Trace right pleural effusion. IMPRESSION: 1. No acute findings. 2. Probable trace right pleural effusion. Electronically Signed   By: Newell Eke M.D.   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  This shows no significant change from the known high-grade mid SMA stenosis.  This is not the typical location of the SMA stenosis although she has some degree of disease at the origin of the SMA. The patient seems to be suffering miserably with chronic mesenteric ischemia.  She has postprandial abdominal pain, food fear, and weight loss.  At this point, I would strongly recommend proceeding with angiography with possible revascularization.  I discussed the risks and benefits of the procedure.  The location of the lesion may make this somewhat more challenging, but nonetheless she is highly  symptomatic and I believe her SMA stenosis is largely to blame.  Superior mesenteric artery stenosis [facility]) She has known severe mesenteric artery stenosis and had another CT scan performed earlier this month which I have independently viewed.  This shows no significant change from the known high-grade mid SMA stenosis.  This is not the typical location of the SMA stenosis although she has some degree of disease at the origin of the SMA. The patient seems to be suffering miserably with chronic mesenteric ischemia.  She has postprandial abdominal pain, food fear, and weight loss.  At this point, I would strongly recommend proceeding with angiography with possible revascularization.  I discussed the risks and benefits of the procedure.  The location of the lesion may make this somewhat more challenging, but nonetheless she is highly symptomatic and I believe her SMA stenosis is largely to blame.  Aortic atherosclerosis (HCC) Fairly mild on the CT scan and not currently problematic.   Hyperlipidemia lipid control important in reducing the progression of atherosclerotic disease.  Patient has stopped taking her statin agent.  Recommended she discuss this with her primary care physician and consider restarting.  If we do perform mesenteric intervention, I would want her to be on a statin agent.  Selinda Gu, MD  09/29/2023 6:13 PM    This note was created with Dragon medical transcription system.  Any errors from dictation are purely unintentional

## 2023-09-29 NOTE — Telephone Encounter (Signed)
 Requesting f/u call to discuss abdominal ain and medication preferences. Please advise.

## 2023-10-01 ENCOUNTER — Telehealth (INDEPENDENT_AMBULATORY_CARE_PROVIDER_SITE_OTHER): Payer: Self-pay

## 2023-10-01 NOTE — Telephone Encounter (Signed)
 Spoke with the patient and she is scheduled with Dr. Marea for a mesenteric angio at the [facility] on 10/07/23 with a 11:00 am arrival time. Pre-procedure instructions were discussed and will be mailed.

## 2023-10-02 LAB — SPECIMEN STATUS REPORT

## 2023-10-02 LAB — OVA AND PARASITE EXAMINATION

## 2023-10-07 ENCOUNTER — Encounter
Admission: RE | Disposition: A | Discharge status: Home / Self Care | Payer: Self-pay | Source: Home / Self Care | Attending: Vascular Surgery

## 2023-10-07 ENCOUNTER — Encounter: Payer: Self-pay | Admitting: Vascular Surgery

## 2023-10-07 ENCOUNTER — Other Ambulatory Visit: Payer: Self-pay

## 2023-10-07 ENCOUNTER — Ambulatory Visit
Admission: RE | Admit: 2023-10-07 | Discharge: 2023-10-07 | Disposition: A | Discharge status: Home / Self Care | Attending: Vascular Surgery | Admitting: Vascular Surgery

## 2023-10-07 ENCOUNTER — Other Ambulatory Visit: Payer: Self-pay | Admitting: Sports Medicine

## 2023-10-07 DIAGNOSIS — I700 Atherosclerosis of aorta: Secondary | ICD-10-CM | POA: Diagnosis not present

## 2023-10-07 DIAGNOSIS — I773 Arterial fibromuscular dysplasia: Secondary | ICD-10-CM | POA: Diagnosis not present

## 2023-10-07 DIAGNOSIS — K551 Chronic vascular disorders of intestine: Secondary | ICD-10-CM

## 2023-10-07 DIAGNOSIS — E785 Hyperlipidemia, unspecified: Secondary | ICD-10-CM | POA: Diagnosis not present

## 2023-10-07 DIAGNOSIS — Z87891 Personal history of nicotine dependence: Secondary | ICD-10-CM | POA: Diagnosis not present

## 2023-10-07 HISTORY — PX: RENAL INTERVENTION: CATH118261

## 2023-10-07 HISTORY — PX: VISCERAL ANGIOGRAPHY: CATH118276

## 2023-10-07 LAB — CREATININE, SERUM
Creatinine, Ser: 0.76 mg/dL (ref 0.44–1.00)
GFR, Estimated: >60 mL/min (ref >60)

## 2023-10-07 LAB — BUN: BUN: 24 mg/dL — ABNORMAL HIGH (ref 8–23)

## 2023-10-07 SURGERY — VISCERAL ANGIOGRAPHY
Anesthesia: Moderate Sedation

## 2023-10-07 MED ORDER — HYDROMORPHONE HCL 1 MG/ML IJ SOLN: 1.0000 mg | Freq: Once | INTRAMUSCULAR | Status: DC | PRN

## 2023-10-07 MED ORDER — MIDAZOLAM HCL 5 MG/5ML IJ SOLN
INTRAMUSCULAR | Status: AC
  Filled 2023-10-07: qty 5

## 2023-10-07 MED ORDER — DIPHENHYDRAMINE HCL 50 MG/ML IJ SOLN: 50.0000 mg | Freq: Once | INTRAMUSCULAR | Status: DC | PRN

## 2023-10-07 MED ORDER — FENTANYL CITRATE (PF) 100 MCG/2ML IJ SOLN
INTRAMUSCULAR | Status: AC
  Filled 2023-10-07: qty 2

## 2023-10-07 MED ORDER — SODIUM CHLORIDE 0.9 % IV BOLUS
500.0000 mL | Freq: Once | INTRAVENOUS | Status: AC
  Administered 2023-10-07: 500 mL via INTRAVENOUS

## 2023-10-07 MED ORDER — CLOPIDOGREL BISULFATE 75 MG PO TABS: 75.0000 mg | ORAL_TABLET | Freq: Every day | ORAL | 11 refills | Status: DC

## 2023-10-07 MED ORDER — IODIXANOL 320 MG/ML IV SOLN
INTRAVENOUS | Status: DC | PRN
  Administered 2023-10-07: 85 mL

## 2023-10-07 MED ORDER — CEFAZOLIN SODIUM-DEXTROSE 2-4 GM/100ML-% IV SOLN
INTRAVENOUS | Status: AC
  Filled 2023-10-07: qty 100

## 2023-10-07 MED ORDER — MIDAZOLAM HCL 2 MG/ML PO SYRP: 8.0000 mg | ORAL_SOLUTION | Freq: Once | ORAL | Status: DC | PRN

## 2023-10-07 MED ORDER — HEPARIN SODIUM (PORCINE) 1000 UNIT/ML IJ SOLN
INTRAMUSCULAR | Status: DC | PRN
  Administered 2023-10-07: 4000 [IU] via INTRAVENOUS

## 2023-10-07 MED ORDER — FENTANYL CITRATE (PF) 100 MCG/2ML IJ SOLN
INTRAMUSCULAR | Status: DC | PRN
  Administered 2023-10-07 (×2): 25 ug via INTRAVENOUS
  Administered 2023-10-07: 50 ug via INTRAVENOUS

## 2023-10-07 MED ORDER — LIDOCAINE-EPINEPHRINE (PF) 1 %-1:200000 IJ SOLN
INTRAMUSCULAR | Status: DC | PRN
  Administered 2023-10-07: 10 mL

## 2023-10-07 MED ORDER — FAMOTIDINE 20 MG PO TABS: 40.0000 mg | ORAL_TABLET | Freq: Once | ORAL | Status: DC | PRN

## 2023-10-07 MED ORDER — CEFAZOLIN SODIUM-DEXTROSE 2-4 GM/100ML-% IV SOLN
2.0000 g | INTRAVENOUS | Status: AC
  Administered 2023-10-07: 2 g via INTRAVENOUS

## 2023-10-07 MED ORDER — ASPIRIN 81 MG PO TBEC: 81.0000 mg | DELAYED_RELEASE_TABLET | Freq: Every day | ORAL | 2 refills | Status: DC

## 2023-10-07 MED ORDER — HEPARIN SODIUM (PORCINE) 1000 UNIT/ML IJ SOLN
INTRAMUSCULAR | Status: AC
  Filled 2023-10-07: qty 10

## 2023-10-07 MED ORDER — SODIUM CHLORIDE 0.9 % IV SOLN: INTRAVENOUS | Status: DC

## 2023-10-07 MED ORDER — METHYLPREDNISOLONE SODIUM SUCC 125 MG IJ SOLR: 125.0000 mg | Freq: Once | INTRAMUSCULAR | Status: DC | PRN

## 2023-10-07 MED ORDER — HEPARIN (PORCINE) IN NACL 1000-0.9 UT/500ML-% IV SOLN
INTRAVENOUS | Status: DC | PRN
Start: 2023-10-07 — End: 2023-10-07
  Administered 2023-10-07: 1000 mL

## 2023-10-07 MED ORDER — MIDAZOLAM HCL 2 MG/2ML IJ SOLN
INTRAMUSCULAR | Status: DC | PRN
Start: 2023-10-07 — End: 2023-10-07
  Administered 2023-10-07: .5 mg via INTRAVENOUS
  Administered 2023-10-07: 1 mg via INTRAVENOUS
  Administered 2023-10-07 (×2): .5 mg via INTRAVENOUS

## 2023-10-07 MED ORDER — ONDANSETRON HCL 4 MG/2ML IJ SOLN: 4.0000 mg | Freq: Four times a day (QID) | INTRAMUSCULAR | Status: DC | PRN

## 2023-10-07 SURGICAL SUPPLY — 18 items
BALLOON LUTONIX DCB 4X100X130 (BALLOONS) IMPLANT
BALLOON LUTONIX DCB 5X60X130 (BALLOONS) IMPLANT
BALLOON LUTONIX DCB 7X40X130 (BALLOONS) IMPLANT
CATH ANGIO 5F PIGTAIL 65CM (CATHETERS) IMPLANT
CATH VS15FR (CATHETERS) IMPLANT
COVER PROBE ULTRASOUND 5X96 (MISCELLANEOUS) IMPLANT
DEVICE PRESTO INFLATION (MISCELLANEOUS) IMPLANT
DEVICE STARCLOSE SE CLOSURE (Vascular Products) IMPLANT
DEVICE TORQUE (MISCELLANEOUS) IMPLANT
GLIDECATH 4FR STR (CATHETERS) IMPLANT
GLIDEWIRE STIFF .35X180X3 HYDR (WIRE) IMPLANT
PACK ANGIOGRAPHY (CUSTOM PROCEDURE TRAY) ×1 IMPLANT
SHEATH ANL2 6FRX45 HC (SHEATH) IMPLANT
SHEATH BRITE TIP 5FRX11 (SHEATH) IMPLANT
SYR MEDRAD MARK 7 150ML (SYRINGE) IMPLANT
TUBING CONTRAST HIGH PRESS 72 (TUBING) IMPLANT
WIRE J 3MM .035X145CM (WIRE) IMPLANT
WIRE SUPRACORE 190CM (WIRE) IMPLANT

## 2023-10-07 NOTE — Interval H&P Note (Signed)
 History and Physical Interval Note:  10/07/2023 11:51 AM  [name]  has presented today for surgery, with the diagnosis of Mesenteric Angio    Superior mesenteric artery stenosis.  The various methods of treatment have been discussed with the patient and family. After consideration of risks, benefits and other options for treatment, the patient has consented to  Procedure(s): VISCERAL ANGIOGRAPHY (N/A) RENAL INTERVENTION (N/A) as a surgical intervention.  The patient's history has been reviewed, patient examined, no change in status, stable for surgery.  I have reviewed the patient's chart and labs.  Questions were answered to the patient's satisfaction.     [name]

## 2023-10-07 NOTE — Op Note (Signed)
 [facility]  Percutaneous Study/Intervention Procedural Note   Date: 10/07/2023  Surgeon(s): Selinda Gu, MD  Assistants: none  Pre-operative Diagnosis: 1.  Chronic Mesenteric ischemia 2. SMA stenosis   Post-operative diagnosis:  Same  Procedure(s) Performed:             1.  Ultrasound guidance for vascular access right femoral artery              2.  Catheter placement into SMA from right femoral approach             3.  Aortogram and selective angiogram of the SMA             4.  PTA of the mid SMA after the initial branches for a high-grade stenosis with a 4 and a 5 mm diameter Lutonix drug-coated angioplasty balloon as well as angioplasty of the origin of the SMA with 7 mm diameter Lutonix drug coated balloon             5.  StarClose closure device right femoral artery  Contrast: 85 cc  Fluoro time: 10.7 minutes  EBL: 5 cc  Anesthesia: Approximately 50 minutes of Moderate conscious sedation using 2.5 mg of Versed  and 100 mcg of Fentanyl               Indications:  Patient is a 78 y.o. female who has symptoms consistent with mesenteric ischemia. The patient has a CT scan showing very high-grade mid SMA stenosis and there is also concern for possible stenosis proximally. The patient is brought in for angiography for further evaluation and potential treatment. Risks and benefits are discussed and informed consent is obtained  Procedure:  The patient was identified and appropriate procedural time out was performed.  The patient was then placed supine on the table and prepped and draped in the usual sterile fashion. Moderate conscious sedation was administered during a face to face encounter with the patient throughout the procedure with my supervision of the RN administering medicines and monitoring the patient's vital signs, pulse oximetry, telemetry and mental status throughout from the start of the procedure until the patient was taken to the recovery room.   Ultrasound was used to evaluate the right common femoral artery.  It was patent .  A digital ultrasound image was acquired.  A Seldinger needle was used to access the right common femoral artery under direct ultrasound guidance and a permanent image was performed.  A 0.035 J wire was advanced without resistance and a 5Fr sheath was placed.  Pigtail catheter was placed into the aorta and an AP aortogram was performed. This demonstrated the aorta and iliac arteries to be without significant stenosis.  The right renal artery had very severe changes of fibromuscular dysplasia.  These were not seen significantly on the left.  The origin of the SMA cannot be well-seen on this image nor could a celiac artery, but the mid SMA lesion could be seen initially on the aortogram in the AP projection to be greater than 80%. We transitioned to the lateral projection to image the celiac and SMA. The lateral image demonstrated an origin stenosis of the superior mesenteric artery of 75 to 80%.  I still did not see a celiac artery opacify well.  The patient was given 4000 units of IV heparin . We upsized to a 6 Fr sheath.  A V S1 catheter was used to selectively cannulate the SMA.  This demonstrated a 75 to 80% stenosis of the  origin of the SMA then there was a very large collateral and poststenotic dilatation.  After a second SMA branch, in the mid SMA there was a very high-grade stenosis of roughly 90%. Based on her symptoms and these findings, I elected to treat the SMA to try to improve the patient's clinical course. I crossed the lesions without difficulty with Glidewire and then exchanged with a glide catheter for a supra core wire.  A 6 French Ansell sheath was then placed in the SMA. I then used a 4 mm and a 5 mm diameter x 40 mm length angioplasty balloon to perform percutaneous transluminal angioplasty of the lesion in the mid SFA.  Following the 5 mm balloon, there was an excellent angiographic result with only about a 15%  residual stenosis.  I then turned my attention to the origin stenosis.  Due to the postop dilatation and the very large branch just beyond the lesion, I was leery of placing a covered stent in this location.  I was also encouraged by the excellent angiographic result with angioplasty in the mid SMA lesion and so I elected to try angioplasty to treat this lesion.  I selected a 7 mm diameter by 4 cm length Lutonix drug-coated angioplasty balloon. I inflated the balloon to 10 atm and held the inflation for 1 minute. On completion angiogram following this angioplasty, there is about a 20%% residual stenosis at the SMA origin was identified. At this point, I elected to terminate the procedure. The diagnostic catheter was removed. StarClose closure device was deployed in usual fashion with excellent hemostatic result. The patient was taken to the recovery room in stable condition having tolerated the procedure well.     Findings: 75 to 80% stenosis at the origin of the SMA and a 90% stenosis in the mid SMA.  Right renal artery had severe changes of fibromuscular dysplasia.  Disposition: Patient was taken to the recovery room in stable condition having tolerated the procedure well.  Complications:  None  Selinda Gu 10/07/2023 3:49 PM   This note was created with Dragon Medical transcription system. Any errors in dictation are purely unintentional.
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
## 2023-10-08 ENCOUNTER — Ambulatory Visit: Admitting: Sports Medicine

## 2023-10-08 ENCOUNTER — Encounter: Payer: Self-pay | Admitting: Vascular Surgery

## 2023-10-08 VITALS — BP 134/82 | HR 61 | Ht 68.0 in | Wt 115.0 lb

## 2023-10-08 DIAGNOSIS — F5104 Psychophysiologic insomnia: Secondary | ICD-10-CM

## 2023-10-08 DIAGNOSIS — S060X0A Concussion without loss of consciousness, initial encounter: Secondary | ICD-10-CM | POA: Diagnosis not present

## 2023-10-08 DIAGNOSIS — G44319 Acute post-traumatic headache, not intractable: Secondary | ICD-10-CM | POA: Diagnosis not present

## 2023-10-08 DIAGNOSIS — R42 Dizziness and giddiness: Secondary | ICD-10-CM

## 2023-10-08 DIAGNOSIS — H9313 Tinnitus, bilateral: Secondary | ICD-10-CM

## 2023-10-08 NOTE — Patient Instructions (Addendum)
 Brain MRI   Call if you need another referral to vestibular therapy  Reach out to primary care about a ENT referral   Follow up 5 days after your MRI to discuss results

## 2023-10-09 ENCOUNTER — Ambulatory Visit: Admitting: Physician Assistant

## 2023-10-09 ENCOUNTER — Encounter: Payer: Self-pay | Admitting: Physician Assistant

## 2023-10-09 VITALS — BP 100/70 | HR 64 | Ht 67.0 in | Wt 118.2 lb

## 2023-10-09 DIAGNOSIS — K52832 Lymphocytic colitis: Secondary | ICD-10-CM | POA: Diagnosis not present

## 2023-10-09 DIAGNOSIS — K551 Chronic vascular disorders of intestine: Secondary | ICD-10-CM | POA: Diagnosis not present

## 2023-10-09 DIAGNOSIS — R634 Abnormal weight loss: Secondary | ICD-10-CM | POA: Diagnosis not present

## 2023-10-09 NOTE — Progress Notes (Signed)
 Chief Complaint: Diarrhea and Weight Loss  HPI:    [name] is a 78 year old female , known to Dr. Avram, with a past medical history as listed below including depression, GERD, fibromyalgia, lymphocytic colitis and multiple others, who presents to clinic today for follow-up of her diarrhea and weight loss.    12/09/2019 colonoscopy for clinically significant diarrhea of unexplained origin with history of lymphocytic colitis.  At that point small colonic polyp status post polypectomy, moderate sigmoid diverticulosis and otherwise normal to the TI.  Pathology showed lymphocytic colitis and a tubular adenoma.    09/23/2023 patient seen in clinic by Jessica Zehr and at that time discussed a history of lymphocytic colitis in 2021 which responded well to Budesonide .  Since June she started having diarrhea and been feeling poorly for the past month.  Up to 18 watery stools a day, apparently seen in the ER at [facility] on 09/22/2023.  Path panel negative and C. difficile was -2 days prior.  Mention the CT scan but it was never ordered.  Potassium is low at 3.  Discussed mid SMA stenosis and follow-up with vascular surgery.  She is post to have some type of procedure with them soon at the time.  At that time abdominal x-ray ordered stat to rule out fecal impaction, CT angio abdomen pelvis stat, if CT negative then start Budesonide  9 mg daily.  Also BMP ordered.    09/23/2023 abdominal x-ray showed scattered stool in the colon but no sign of fecal impaction.  Minimal gassy prominence of the small bowel and colon.    09/20/2023 CT angio showed artery stenosis was chronic and not significantly changed from prior exam.  It did show fluid throughout the colon some information.  Patient started on Budesonide  3 mg tablets take 3 daily for total 9 mg.    10/07/2023 vascular surgery performed PTA of the mid SMA.    Today, the patient and her husband present to clinic and thankfully explained that her bowel  movements are much better.  She was having 18-20 liquid stools a day and losing weight, now she will have 1 or 2 a day or not at all.  The liquid stools she has now she thinks are related to things that she are eating specifically and she is trying to avoid certain foods.  She has been drinking FIT Gatorade which is keeping her hydrated and trying to add in Ensure shakes in order to gain some of her weight back.  She continues on Budesonide  9 mg daily for now as well as Bentyl  10 mg every 6 hours as needed.  In general feeling much better from a GI standpoint, just trying to get stronger now.    Denies fever, chills or blood in her stool.  Past Medical History:  Diagnosis Date   Allergy     Anemia    Anxiety    Asthma    Cataract    bil cateracts removed   Complication of anesthesia    Depression    generalized anxiety disorder   Diverticulosis    Duodenal diverticulum    Family history of adverse reaction to anesthesia    Mother and Daughters- N/V   Fibromyalgia    Gastroesophageal reflux disease with hiatal hernia    Hiatal hernia    History of kidney stones    Hyperlipidemia    IBS (irritable bowel syndrome)    Lymphocytic colitis    Dr Jakie   Migraine headache    Mitral  valve prolapse    Neuropathy    Orthostatic hypotension    Osteopenia    BMD ordered by GYN   Osteoporosis    Peripheral neuropathy    treated as RLS by  Neurology   PONV (postoperative nausea and vomiting)    Restless leg syndrome    Schatzki's ring    History of   Sleep apnea    On CPAP, has not been using   Spondylosis     Past Surgical History:  Procedure Laterality Date   ANAL RECTAL MANOMETRY N/A 11/14/2015   Procedure: ANO RECTAL MANOMETRY;  Surgeon: Lupita FORBES Commander, MD;  Location: [facility];  Service: Endoscopy;  Laterality: N/A;   BREAST ENHANCEMENT SURGERY     CATARACT EXTRACTION Bilateral    cataract surgery Left    Dr Camillo   CHOLECYSTECTOMY     COLONOSCOPY     ESOPHAGEAL  DILATION     X 2   RENAL INTERVENTION N/A 10/07/2023   Procedure: RENAL INTERVENTION;  Surgeon: Marea Selinda RAMAN, MD;  Location: [facility] INVASIVE CV LAB;  Service: Cardiovascular;  Laterality: N/A;   ROTATOR CUFF REPAIR Left    SINUS ENDO W/FUSION N/A 05/30/2014   Procedure: REVISION  FRONTAL SINUS SURGERY WITH FUSION SCAN;  Surgeon: Alm Bouche, MD;  Location: [facility];  Service: ENT;  Laterality: N/A;   SINUS SURGERY WITH INSTATRAK     x 2   TUBAL LIGATION     UPPER GASTROINTESTINAL ENDOSCOPY     Vaginal cystectomy     x 2   VAGINAL HYSTERECTOMY  05/2007   Vaginal repair, Dr Cary.  Partial  hysterectomy.   VISCERAL ANGIOGRAPHY N/A 10/07/2023   Procedure: VISCERAL ANGIOGRAPHY;  Surgeon: Marea Selinda RAMAN, MD;  Location: [facility] INVASIVE CV LAB;  Service: Cardiovascular;  Laterality: N/A;   WRIST ARTHROSCOPY      Current Outpatient Medications  Medication Sig Dispense Refill   aspirin  EC 81 MG tablet Take 1 tablet (81 mg total) by mouth daily. Swallow whole. 150 tablet 2   Azelastine -Fluticasone  137-50 MCG/ACT SUSP PLACE 1 SPRAY INTO THE NOSE EVERY 12 (TWELVE) HOURS. 23 g 8   budeson-glycopyrrolate -formoterol  (BREZTRI  AEROSPHERE) 160-9-4.8 MCG/ACT AERO Inhale 2 puffs into the lungs 2 (two) times daily. 10.7 g 11   budesonide  (ENTOCORT EC ) 3 MG 24 hr capsule Take 3 capsules (9 mg total) by mouth daily. 270 capsule 1   clopidogrel  (PLAVIX ) 75 MG tablet Take 1 tablet (75 mg total) by mouth daily. 30 tablet 11   Cyanocobalamin  (VITAMIN B-12 PO) Take 1 tablet by mouth daily.     dicyclomine  (BENTYL ) 10 MG capsule Take 10 mg by mouth every 6 (six) hours as needed (abdominal pain/diarrhea).     famotidine  (PEPCID ) 40 MG tablet Take 1 tablet (40 mg total) by mouth 2 (two) times daily as needed for heartburn or indigestion. 180 tablet 1   fluticasone -salmeterol (WIXELA INHUB) 100-50 MCG/ACT AEPB Inhale 1 puff into the lungs 2 (two) times daily.     hydrOXYzine (ATARAX) 25 MG tablet Take 25 mg by mouth daily as  needed.     levalbuterol  (XOPENEX  HFA) 45 MCG/ACT inhaler Inhale 1-2 puffs into the lungs every 4 (four) hours as needed for wheezing. 1 each 12   midodrine  (PROAMATINE ) 10 MG tablet Take 1 tablet (10 mg total) by mouth 2 (two) times daily as needed (for SBP 100 or less).     ondansetron  (ZOFRAN -ODT) 4 MG disintegrating tablet Take 1 tablet (4 mg total) by  mouth every 8 (eight) hours as needed for nausea or vomiting. 20 tablet 0   OVER THE COUNTER MEDICATION Bone Up 1000 mg 2 tabs TID     OVER THE COUNTER MEDICATION B-Right (B-Complex) 1 po daily     PARoxetine (PAXIL) 20 MG tablet Take 20 mg by mouth at bedtime.     potassium chloride  SA (KLOR-CON  M) 20 MEQ tablet Take 1 tablet (20 mEq total) by mouth daily. 30 tablet 0   Probiotic Product (ALIGN) 4 MG CAPS Take 1 capsule by mouth as needed.     RESTASIS 0.05 % ophthalmic emulsion 1 drop 2 (two) times daily.     rosuvastatin  (CRESTOR ) 5 MG tablet TAKE 1 TABLET (5 MG TOTAL) BY MOUTH DAILY FOR CHOLESTEROL 90 tablet 3   traZODone  (DESYREL ) 50 MG tablet Take 1 tablet (50 mg total) by mouth at bedtime. 30 tablet 0   vitamin C (ASCORBIC ACID) 500 MG tablet Take 500 mg by mouth daily.     vitamin E 180 MG (400 UNITS) capsule Take 400 Units by mouth daily.     No current facility-administered medications for this visit.    Allergies as of 10/09/2023 - Review Complete 10/09/2023  Allergen Reaction Noted   Tape Rash 08/04/2011   Albuterol   04/14/2016   Cymbalta  [duloxetine  hcl]  08/15/2015   Latex Other (See Comments) 01/09/2020   Lyrica  [pregabalin ] Other (See Comments) 12/14/2012   Neurontin [gabapentin] Other (See Comments) 12/14/2012   Nitrofuran derivatives Other (See Comments) 10/11/2012   Paxil [paroxetine hcl]  08/15/2015   Prozac  [fluoxetine  hcl] Other (See Comments) 08/16/2015    Family History  Problem Relation Age of Onset   Hypertension Father 41   Stroke Father 5   Heart attack Father         ? in 70s   Hypertension Mother     Neuropathy Mother    Anemia Mother    Coronary artery disease Brother        Stent placement in 12s   Diabetes Maternal Uncle    Heart attack Paternal Technical brewer , ? age   Colon cancer Neg Hx    Seizures Neg Hx    Esophageal cancer Neg Hx    Rectal cancer Neg Hx    Stomach cancer Neg Hx     Social History   Socioeconomic History   Marital status: Married    Spouse name: Not on file   Number of children: 2   Years of education: 18   Highest education level: Not on file  Occupational History   Occupation: retired    Associate Professor: [employer]  Tobacco Use   Smoking status: Former    Current packs/day: 0.00    Average packs/day: 0.5 packs/day for 15.0 years (7.5 ttl pk-yrs)    Types: Cigarettes    Start date: 03/04/1983    Quit date: 03/03/1998    Years since quitting: 25.6    Passive exposure: Past   Smokeless tobacco: Never   Tobacco comments:    smoked 1973- 2006, up to <  1  ppd  Vaping Use   Vaping status: Never Used  Substance and Sexual Activity   Alcohol use: No    Alcohol/week: 0.0 standard drinks of alcohol   Drug use: No   Sexual activity: Yes    Partners: Male    Comment: 1st intercourse- 18, partners- 2, married- 22 yrs   Other Topics Concern   Not  on file  Social History Narrative   HAS REGULAR EXERCISE   DAILY CAFFEINE: 1-2 CUPS   Patient is right handed.   Social Drivers of Corporate investment banker Strain: Not on file  Food Insecurity: Low Risk  (06/08/2023)   Received from [facility]   Hunger Vital Sign    Within the past 12 months, you worried that your food would run out before you got money to buy more: Never true    Within the past 12 months, the food you bought just didn't last and you didn't have money to get more. : Never true  Transportation Needs: No Transportation Needs (06/08/2023)   Received from [facility]    In the past 12 months, has lack of reliable transportation kept you from medical appointments,  meetings, work or from getting things needed for daily living? : No  Physical Activity: Not on file  Stress: Not on file  Social Connections: Not on file  Intimate Partner Violence: Not on file    Review of Systems:    Constitutional: No fever or chills Cardiovascular: No chest pain Respiratory: No SOB  Gastrointestinal: See HPI and otherwise negative   Physical Exam:  Vital signs: BP 100/70 (BP Location: Left Arm, Patient Position: Sitting, Cuff Size: Normal)   Pulse 64   Ht 5' 7 (1.702 m) Comment: height measured without shoes  Wt 118 lb 4 oz (53.6 kg)   BMI 18.52 kg/m    Constitutional:   Pleasant thin appearing Caucasian female appears to be in NAD, Well developed, Well nourished, alert and cooperative Respiratory: Respirations even and unlabored. Lungs clear to auscultation bilaterally.   No wheezes, crackles, or rhonchi.  Cardiovascular: Normal S1, S2. No MRG. Regular rate and rhythm. No peripheral edema, cyanosis or pallor.  Gastrointestinal:  Soft, nondistended, nontender. No rebound or guarding.  Increased bowel sounds all 4 quadrants. No appreciable masses or hepatomegaly. Rectal:  Not performed.  Psychiatric: Oriented to person, place and time. Demonstrates good judgement and reason without abnormal affect or behaviors.  Most recent labs: CBC    Component Value Date/Time   WBC 7.2 09/22/2023 1456   RBC 3.91 09/22/2023 1456   HGB 11.8 (L) 09/22/2023 1456   HCT 35.5 (L) 09/22/2023 1456   PLT 234 09/22/2023 1456   MCV 90.8 09/22/2023 1456   MCH 30.2 09/22/2023 1456   MCHC 33.2 09/22/2023 1456   RDW 13.9 09/22/2023 1456   LYMPHSABS 0.9 09/21/2023 1005   MONOABS 0.6 09/21/2023 1005   EOSABS 0.1 09/21/2023 1005   BASOSABS 0.0 09/21/2023 1005    CMP     Component Value Date/Time   NA 137 09/23/2023 1551   NA 139 12/24/2022 1245   K 3.6 09/23/2023 1551   CL 103 09/23/2023 1551   CO2 26 09/23/2023 1551   GLUCOSE 90 09/23/2023 1551   BUN 24 (H) 10/07/2023  1119   BUN 17 12/24/2022 1245   CREATININE 0.76 10/07/2023 1119   CALCIUM  8.9 09/23/2023 1551   PROT 6.6 09/22/2023 1456   PROT 6.5 12/11/2021 1217   ALBUMIN 3.8 09/22/2023 1456   ALBUMIN 4.3 04/20/2012 1018   AST 23 09/22/2023 1456   ALT 20 09/22/2023 1456   ALKPHOS 47 09/22/2023 1456   BILITOT 0.4 09/22/2023 1456   GFRNONAA >60 10/07/2023 1119   GFRAA >90 05/30/2014 0637    Assessment: 1.  Lymphocytic colitis: Patient now feeling better after Budesonide  9 mg daily over the past 2 weeks,  bowel movements down to 1-2 a day or not at all, also continues Dicyclomine  10 mg every 6 hours for some abdominal cramping 2.  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  Weight loss: Likely related to all of the above, she is actually stable from when she was seen in clinic a few weeks ago by Harlene, she is added in Ensure shakes and I am hoping this will get better now that her colitis is under control  Plan: 1.  Continue Budesonide  9 mg daily for total of 8 weeks, then decrease to 6 mg for 2 weeks, then 3 mg for 2 weeks then stop.  Did discuss that she develops diarrhea or any increase in symptoms at any point while tapering down then she should call and let us  know. 2.  Patient has a follow-up with Dr. Avram already scheduled for September 19.  The patient and her husband elected to keep this appointment.  If she is still doing well they can call and cancel a few days before. 3.  Patient encouraged to eat so that she can start putting on weight, answered all of her questions about lymphocytic colitis today. 4.  Patient to follow in clinic with Dr. Avram as scheduled or in the future as needed.  Delon Failing, PA-C [facility] 10/09/2023, 3:13 PM  Cc: Geofm Glade PARAS, MD

## 2023-10-09 NOTE — Patient Instructions (Signed)
 Continue Budesonide  9 mg daily until 11/26/23, then decrease to 6 mg daily for 2 weeks (12/10/23), then decrease to 3 mg for 2 weeks then stop.   If diarrhea increases at any point call and let us  know.   _______________________________________________________  If your blood pressure at your visit was 140/90 or greater, please contact your primary care physician to follow up on this.  _______________________________________________________  If you are age 84 or older, your body mass index should be between 23-30. Your Body mass index is 18.52 kg/m. If this is out of the aforementioned range listed, please consider follow up with your Primary Care Provider.  If you are age 24 or younger, your body mass index should be between 19-25. Your Body mass index is 18.52 kg/m. If this is out of the aformentioned range listed, please consider follow up with your Primary Care Provider.   ________________________________________________________  The [facility] providers would like to encourage you to use MYCHART to communicate with providers for non-urgent requests or questions.  Due to long hold times on the telephone, sending your provider a message by [facility] may be a faster and more efficient way to get a response.  Please allow 48 business hours for a response.  Please remember that this is for non-urgent requests.  _______________________________________________________  [facility] is using a team-based approach to care.  Your team is made up of your doctor and two to three APPS. Our APPS (Nurse Practitioners and Physician Assistants) work with your physician to ensure care continuity for you. They are fully qualified to address your health concerns and develop a treatment plan. They communicate directly with your gastroenterologist to care for you. Seeing the Advanced Practice Practitioners on your physician's team can help you by facilitating care more promptly, often allowing for  earlier appointments, access to diagnostic testing, procedures, and other specialty referrals.

## 2023-10-12 ENCOUNTER — Ambulatory Visit (INDEPENDENT_AMBULATORY_CARE_PROVIDER_SITE_OTHER)

## 2023-10-12 DIAGNOSIS — R42 Dizziness and giddiness: Secondary | ICD-10-CM

## 2023-10-12 DIAGNOSIS — F5104 Psychophysiologic insomnia: Secondary | ICD-10-CM

## 2023-10-12 DIAGNOSIS — G44319 Acute post-traumatic headache, not intractable: Secondary | ICD-10-CM

## 2023-10-12 DIAGNOSIS — H9313 Tinnitus, bilateral: Secondary | ICD-10-CM | POA: Diagnosis not present

## 2023-10-12 DIAGNOSIS — S060X0A Concussion without loss of consciousness, initial encounter: Secondary | ICD-10-CM

## 2023-10-12 MED ORDER — GADOBUTROL 1 MMOL/ML IV SOLN
5.3000 mL | Freq: Once | INTRAVENOUS | Status: AC | PRN
  Administered 2023-10-12 (×2): 5.3 mL via INTRAVENOUS
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
## 2023-10-19 ENCOUNTER — Ambulatory Visit: Payer: Self-pay | Admitting: Sports Medicine

## 2023-10-19 ENCOUNTER — Ambulatory Visit (INDEPENDENT_AMBULATORY_CARE_PROVIDER_SITE_OTHER): Admitting: Sports Medicine

## 2023-10-19 VITALS — BP 112/60 | HR 67 | Ht 67.0 in | Wt 119.0 lb

## 2023-10-19 DIAGNOSIS — H938X3 Other specified disorders of ear, bilateral: Secondary | ICD-10-CM

## 2023-10-19 DIAGNOSIS — F5104 Psychophysiologic insomnia: Secondary | ICD-10-CM | POA: Diagnosis not present

## 2023-10-19 DIAGNOSIS — R42 Dizziness and giddiness: Secondary | ICD-10-CM

## 2023-10-19 DIAGNOSIS — S060X0A Concussion without loss of consciousness, initial encounter: Secondary | ICD-10-CM

## 2023-10-19 DIAGNOSIS — H9313 Tinnitus, bilateral: Secondary | ICD-10-CM | POA: Diagnosis not present

## 2023-10-19 NOTE — Patient Instructions (Signed)
 Recommend asking Dr. Lenette for an ENT referral. Follow up with me as needed.

## 2023-10-20 ENCOUNTER — Telehealth: Payer: Self-pay

## 2023-10-20 ENCOUNTER — Other Ambulatory Visit: Payer: Self-pay | Admitting: Internal Medicine

## 2023-10-20 ENCOUNTER — Other Ambulatory Visit: Payer: Self-pay | Admitting: *Deleted

## 2023-10-20 ENCOUNTER — Telehealth: Payer: Self-pay | Admitting: Physician Assistant

## 2023-10-20 DIAGNOSIS — H9313 Tinnitus, bilateral: Secondary | ICD-10-CM

## 2023-10-20 DIAGNOSIS — H938X3 Other specified disorders of ear, bilateral: Secondary | ICD-10-CM

## 2023-10-20 MED ORDER — POTASSIUM CHLORIDE 20 MEQ PO PACK: 20.0000 meq | PACK | Freq: Every day | ORAL | 1 refills | Status: DC

## 2023-10-20 NOTE — Telephone Encounter (Signed)
 Copied from CRM [id number]. Topic: Referral - Question >> Oct 20, 2023 11:37 AM Charolett L wrote: Reason for CRM: patient would like to be referred to an ENT doctor as per Dr.Jackson. Patient would also like to be called back in reference to getting the referral as well

## 2023-10-20 NOTE — Telephone Encounter (Signed)
 Patient requesting to have medication different medication switched to a capsule. Please advise

## 2023-10-20 NOTE — Telephone Encounter (Signed)
 Patient states her potassium pill is to big and she can't swallow it. Please advise.

## 2023-10-20 NOTE — Telephone Encounter (Signed)
 Script sent in for potassium powder to patient's pharmacy.

## 2023-10-22 NOTE — Addendum Note (Signed)
 Addended by: GEOFM GLADE PARAS on: 10/22/2023 12:03 PM   Modules accepted: Orders

## 2023-10-22 NOTE — Telephone Encounter (Signed)
 Referral ordered

## 2023-10-28 LAB — HM DEXA SCAN

## 2023-10-28 LAB — HM MAMMOGRAPHY

## 2023-10-29 ENCOUNTER — Encounter: Payer: Self-pay | Admitting: Cardiovascular Disease

## 2023-10-29 NOTE — Telephone Encounter (Signed)
 error

## 2023-10-30 ENCOUNTER — Telehealth: Payer: Self-pay | Admitting: Physician Assistant

## 2023-10-30 NOTE — Telephone Encounter (Signed)
 Spoke with the pt and reiterated the budesonide  taper.  The pt verbalized understanding and will call back if she has any further questions       Plan: 1.  Continue Budesonide  9 mg daily for total of 8 weeks, then decrease to 6 mg for 2 weeks, then 3 mg for 2 weeks then stop.  Did discuss that she develops diarrhea or any increase in symptoms at any point while tapering down then she should call and let us  know.

## 2023-10-30 NOTE — Telephone Encounter (Signed)
 Inbound call from patient stating she would like to speak to someone in regards to getting off medication budesonide  due to it making her heart go fast. Patient is stating she been trying to get off medication for a while but would like to speak to someone first.  Requesting a call back  Please advise  Thank you

## 2023-11-03 ENCOUNTER — Ambulatory Visit: Payer: Self-pay | Admitting: Internal Medicine

## 2023-11-10 ENCOUNTER — Encounter (INDEPENDENT_AMBULATORY_CARE_PROVIDER_SITE_OTHER): Payer: Self-pay | Admitting: Vascular Surgery

## 2023-11-10 ENCOUNTER — Other Ambulatory Visit (INDEPENDENT_AMBULATORY_CARE_PROVIDER_SITE_OTHER): Payer: Self-pay | Admitting: Vascular Surgery

## 2023-11-10 DIAGNOSIS — K551 Chronic vascular disorders of intestine: Secondary | ICD-10-CM

## 2023-11-12 ENCOUNTER — Encounter (INDEPENDENT_AMBULATORY_CARE_PROVIDER_SITE_OTHER): Payer: Self-pay | Admitting: Nurse Practitioner

## 2023-11-12 ENCOUNTER — Ambulatory Visit (INDEPENDENT_AMBULATORY_CARE_PROVIDER_SITE_OTHER)

## 2023-11-12 ENCOUNTER — Ambulatory Visit (INDEPENDENT_AMBULATORY_CARE_PROVIDER_SITE_OTHER): Admitting: Nurse Practitioner

## 2023-11-12 VITALS — BP 128/75 | HR 52 | Resp 18 | Wt 119.0 lb

## 2023-11-12 DIAGNOSIS — I773 Arterial fibromuscular dysplasia: Secondary | ICD-10-CM

## 2023-11-12 DIAGNOSIS — K551 Chronic vascular disorders of intestine: Secondary | ICD-10-CM

## 2023-11-12 DIAGNOSIS — I701 Atherosclerosis of renal artery: Secondary | ICD-10-CM | POA: Diagnosis not present

## 2023-11-17 ENCOUNTER — Encounter (INDEPENDENT_AMBULATORY_CARE_PROVIDER_SITE_OTHER): Payer: Self-pay | Admitting: Nurse Practitioner

## 2023-11-17 NOTE — Progress Notes (Signed)
 Subjective:    Patient ID: [name], female    DOB: [birth year], 78 y.o.   MRN: [id number] Chief Complaint  Patient presents with   Follow-up    [facility] 4 week with Mesenteric duplex    Patient returns today for follow-up regarding recent mesenteric intervention.  She had angioplasty of her mid SMA on 10/07/2023.  She also has colitis.  She notes that since her intervention she still been dealing with her colitis flares although has been somewhat slightly better.  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  She denies any issues with hypertension and she actually has episodes of hypotension at times.  Her largest complaint at this time is of bruising caused by her Plavix  and aspirin .  Today noninvasive studies show that the eversion of the SMA is widely patent.  There is also a large collateral to the SMA what was also noted on her angiogram as well.  It is large, slightly patent.    Review of Systems  Gastrointestinal:  Positive for abdominal pain and diarrhea.  All other systems reviewed and are negative.      Objective:   Physical Exam Vitals reviewed.  HENT:     Head: Normocephalic.  Cardiovascular:     Rate and Rhythm: Normal rate.     Pulses: Normal pulses.  Pulmonary:     Effort: Pulmonary effort is normal.  Skin:    General: Skin is warm and dry.  Neurological:     Mental Status: She is alert and oriented to person, place, and time.  Psychiatric:        Mood and Affect: Mood normal.        Behavior: Behavior normal.        Thought Content: Thought content normal.        Judgment: Judgment normal.     BP 128/75   Pulse (!) 52   Resp 18   Wt 119 lb (54 kg)   BMI 18.64 kg/m   Past Medical History:  Diagnosis Date   Allergy     Anemia    Anxiety    Asthma    Cataract    bil cateracts removed   Complication of anesthesia    Depression    generalized anxiety  disorder   Diverticulosis    Duodenal diverticulum    Family history of adverse reaction to anesthesia    Mother and Daughters- N/V   Fibromyalgia    Gastroesophageal reflux disease with hiatal hernia    Hiatal hernia    History of kidney stones    Hyperlipidemia    IBS (irritable bowel syndrome)    Lymphocytic colitis    Dr Jakie   Migraine headache    Mitral valve prolapse    Neuropathy    Orthostatic hypotension    Osteopenia    BMD ordered by GYN   Osteoporosis    Peripheral neuropathy    treated as RLS by  Neurology   PONV (postoperative nausea and vomiting)    Restless leg syndrome    Schatzki's ring    History of   Sleep apnea    On CPAP, has not been using   Spondylosis     Social History   Socioeconomic History   Marital status: Married    Spouse name: Not on file   Number of children: 2   Years of education: 18   Highest education level: Not on file  Occupational History   Occupation: retired    Associate Professor: [employer]  Tobacco Use   Smoking status: Former    Current packs/day: 0.00    Average packs/day: 0.5 packs/day for 15.0 years (7.5 ttl pk-yrs)    Types: Cigarettes    Start date: 03/04/1983    Quit date: 03/03/1998    Years since quitting: 25.7    Passive exposure: Past   Smokeless tobacco: Never   Tobacco comments:    smoked 1973- 2006, up to <  1  ppd  Vaping Use   Vaping status: Never Used  Substance and Sexual Activity   Alcohol use: No    Alcohol/week: 0.0 standard drinks of alcohol   Drug use: No   Sexual activity: Yes    Partners: Male    Comment: 1st intercourse- 5, partners- 2, married- 22 yrs   Other Topics Concern   Not on file  Social History Narrative   HAS REGULAR EXERCISE   DAILY CAFFEINE: 1-2 CUPS   Patient is right handed.   Social Drivers of Corporate investment banker Strain: Not on file  Food Insecurity: Low Risk  (06/08/2023)   Received from [facility]   Hunger Vital Sign    Within the past 12 months, you  worried that your food would run out before you got money to buy more: Never true    Within the past 12 months, the food you bought just didn't last and you didn't have money to get more. : Never true  Transportation Needs: No Transportation Needs (06/08/2023)   Received from [facility]    In the past 12 months, has lack of reliable transportation kept you from medical appointments, meetings, work or from getting things needed for daily living? : No  Physical Activity: Not on file  Stress: Not on file  Social Connections: Not on file  Intimate Partner Violence: Not on file    Past Surgical History:  Procedure Laterality Date   ANAL RECTAL MANOMETRY N/A 11/14/2015   Procedure: ANO RECTAL MANOMETRY;  Surgeon: Lupita FORBES Commander, MD;  Location: [facility];  Service: Endoscopy;  Laterality: N/A;   BREAST ENHANCEMENT SURGERY     CATARACT EXTRACTION Bilateral    cataract surgery Left    Dr Camillo   CHOLECYSTECTOMY     COLONOSCOPY     ESOPHAGEAL DILATION     X 2   RENAL INTERVENTION N/A 10/07/2023   Procedure: RENAL INTERVENTION;  Surgeon: Marea Selinda RAMAN, MD;  Location: [facility] INVASIVE CV [facility];  Service: Cardiovascular;  Laterality: N/A;   ROTATOR CUFF REPAIR Left    SINUS ENDO W/FUSION N/A 05/30/2014   Procedure: REVISION  FRONTAL SINUS SURGERY WITH FUSION SCAN;  Surgeon: Alm Bouche, MD;  Location: [facility] OR;  Service: ENT;  Laterality: N/A;   SINUS SURGERY WITH INSTATRAK     x 2   TUBAL LIGATION     UPPER GASTROINTESTINAL ENDOSCOPY     Vaginal cystectomy     x 2   VAGINAL HYSTERECTOMY  05/2007   Vaginal repair, Dr Cary.  Partial  hysterectomy.   VISCERAL ANGIOGRAPHY N/A 10/07/2023   Procedure: VISCERAL ANGIOGRAPHY;  Surgeon: Marea Selinda RAMAN, MD;  Location: [facility] INVASIVE CV LAB;  Service: Cardiovascular;  Laterality: N/A;   WRIST ARTHROSCOPY      Family History  Problem Relation Age of Onset   Hypertension Father 74   Stroke Father 58   Heart attack Father         ?  in  52s   Hypertension Mother    Neuropathy Mother    Anemia Mother    Coronary artery disease Brother        Stent placement in 3s   Diabetes Maternal Uncle    Heart attack Paternal Uncle        SEVEN , ? age   Colon cancer Neg Hx    Seizures Neg Hx    Esophageal cancer Neg Hx    Rectal cancer Neg Hx    Stomach cancer Neg Hx     Allergies  Allergen Reactions   Tape Rash   Albuterol      heartburn   Cymbalta  [Duloxetine  Hcl]     Diarrhea, nausea, anxiety worse, shaky   Latex Other (See Comments)   Lyrica  [Pregabalin ] Other (See Comments)    Sedation   Neurontin [Gabapentin] Other (See Comments)    Sedation   Nitrofuran Derivatives Other (See Comments)    tingling   Paxil [Paroxetine Hcl]     Bowel upset, tingling   Prozac  [Fluoxetine  Hcl] Other (See Comments)    Made patient worse and constipation       Latest Ref Rng & Units 09/22/2023    2:56 PM 09/21/2023   10:05 AM 06/03/2023    8:48 AM  CBC  WBC 4.0 - 10.5 K/uL 7.2  7.0  5.0   Hemoglobin 12.0 - 15.0 g/dL 88.1  87.7  86.7   Hematocrit 36.0 - 46.0 % 35.5  35.8  39.6   Platelets 150 - 400 K/uL 234  208.0  241.0       CMP     Component Value Date/Time   NA 137 09/23/2023 1551   NA 139 12/24/2022 1245   K 3.6 09/23/2023 1551   CL 103 09/23/2023 1551   CO2 26 09/23/2023 1551   GLUCOSE 90 09/23/2023 1551   BUN 24 (H) 10/07/2023 1119   BUN 17 12/24/2022 1245   CREATININE 0.76 10/07/2023 1119   CALCIUM  8.9 09/23/2023 1551   PROT 6.6 09/22/2023 1456   PROT 6.5 12/11/2021 1217   ALBUMIN 3.8 09/22/2023 1456   ALBUMIN 4.3 04/20/2012 1018   AST 23 09/22/2023 1456   ALT 20 09/22/2023 1456   ALKPHOS 47 09/22/2023 1456   BILITOT 0.4 09/22/2023 1456   GFR 85.27 09/23/2023 1551   EGFR 95 12/24/2022 1245   GFRNONAA >60 10/07/2023 1119     No results found.     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   Velocities are improved from previous studies.  Patient continues to have issues with his more symptoms with underlying colitis.  At this time we do not recommend intervention.  Patient advised to continue with Plavix  and aspirin  despite bruising as this will help to maintain patency of her intervention.  Patient will return noninvasive studies in 3 months.  2. Renal artery stenosis (HCC) Right renal artery had noted severe changes of fibromuscular dysplasia.  However the patient's blood pressure remained under good control.  In fact it is a little hypotensive and at times.  Based on this we will continue to monitor  3. Fibromuscular dysplasia (HCC) Patient has a history of fibromuscular dysplasia evidence of her angiogram.  There can be a correlation with fibromuscular dysplasia of the carotid arteries as well.  Given her history of atherosclerotic disease and it improved to evaluate her carotid arteries when she returns for her follow-up  visit.   Current Outpatient Medications on File Prior to Visit  Medication Sig Dispense Refill   aspirin  EC 81 MG tablet Take 1 tablet (81 mg total) by mouth daily. Swallow whole. 150 tablet 2   Azelastine -Fluticasone  137-50 MCG/ACT SUSP PLACE 1 SPRAY INTO THE NOSE EVERY 12 (TWELVE) HOURS. 23 g 8   budeson-glycopyrrolate -formoterol  (BREZTRI  AEROSPHERE) 160-9-4.8 MCG/ACT AERO Inhale 2 puffs into the lungs 2 (two) times daily. 10.7 g 11   budesonide  (ENTOCORT EC ) 3 MG 24 hr capsule Take 3 capsules (9 mg total) by mouth daily. 270 capsule 1   clopidogrel  (PLAVIX ) 75 MG tablet Take 1 tablet (75 mg total) by mouth daily. 30 tablet 11   Cyanocobalamin  (VITAMIN B-12 PO) Take 1 tablet by mouth daily.     dicyclomine  (BENTYL ) 10 MG capsule Take 10 mg by mouth every 6 (six) hours as needed (abdominal pain/diarrhea).     famotidine  (PEPCID ) 40 MG tablet Take 1 tablet (40 mg total) by mouth 2 (two) times daily as needed for heartburn or indigestion. 180 tablet 1    fluticasone -salmeterol (WIXELA INHUB) 100-50 MCG/ACT AEPB Inhale 1 puff into the lungs 2 (two) times daily.     hydrOXYzine (ATARAX) 25 MG tablet Take 25 mg by mouth daily as needed.     levalbuterol  (XOPENEX  HFA) 45 MCG/ACT inhaler Inhale 1-2 puffs into the lungs every 4 (four) hours as needed for wheezing. 1 each 12   loperamide (IMODIUM A-D) 2 MG tablet Take 2 mg by mouth as needed for diarrhea or loose stools.     midodrine  (PROAMATINE ) 10 MG tablet Take 1 tablet (10 mg total) by mouth 2 (two) times daily as needed (for SBP 100 or less).     ondansetron  (ZOFRAN -ODT) 4 MG disintegrating tablet Take 1 tablet (4 mg total) by mouth every 8 (eight) hours as needed for nausea or vomiting. 20 tablet 0   OVER THE COUNTER MEDICATION Bone Up 1000 mg 2 tabs TID     OVER THE COUNTER MEDICATION B-Right (B-Complex) 1 po daily     pantoprazole  (PROTONIX ) 40 MG tablet Take 40 mg by mouth daily.     PARoxetine (PAXIL) 20 MG tablet Take 20 mg by mouth at bedtime.     potassium chloride  (KLOR-CON ) 20 MEQ packet Take 20 mEq by mouth daily. 30 packet 1   Probiotic Product (ALIGN) 4 MG CAPS Take 1 capsule by mouth as needed.     RESTASIS 0.05 % ophthalmic emulsion 1 drop 2 (two) times daily.     rosuvastatin  (CRESTOR ) 5 MG tablet TAKE 1 TABLET (5 MG TOTAL) BY MOUTH DAILY FOR CHOLESTEROL 90 tablet 3   traZODone  (DESYREL ) 50 MG tablet Take 1 tablet (50 mg total) by mouth at bedtime. 30 tablet 0   vitamin C (ASCORBIC ACID) 500 MG tablet Take 500 mg by mouth daily.     vitamin E 180 MG (400 UNITS) capsule Take 400 Units by mouth daily.     [DISCONTINUED] Hyoscyamine -Phenyltoloxamine (DIGEX NF) 0.0625-15 MG CAPS Take one tablet by mouth as needed for abdominal cramping 24 each 0   No current facility-administered medications on file prior to visit.    There are no Patient Instructions on file for this visit. No follow-ups on file.   Gabrial Domine E Fatema Rabe, NP

## 2023-11-20 ENCOUNTER — Telehealth: Payer: Self-pay | Admitting: Internal Medicine

## 2023-11-20 ENCOUNTER — Encounter: Payer: Self-pay | Admitting: Internal Medicine

## 2023-11-20 ENCOUNTER — Ambulatory Visit: Admitting: Internal Medicine

## 2023-11-20 ENCOUNTER — Other Ambulatory Visit (INDEPENDENT_AMBULATORY_CARE_PROVIDER_SITE_OTHER)

## 2023-11-20 VITALS — BP 110/70 | HR 64 | Ht 67.0 in | Wt 118.5 lb

## 2023-11-20 DIAGNOSIS — K52832 Lymphocytic colitis: Secondary | ICD-10-CM | POA: Diagnosis not present

## 2023-11-20 DIAGNOSIS — K529 Noninfective gastroenteritis and colitis, unspecified: Secondary | ICD-10-CM | POA: Diagnosis not present

## 2023-11-20 DIAGNOSIS — K5902 Outlet dysfunction constipation: Secondary | ICD-10-CM

## 2023-11-20 DIAGNOSIS — K582 Mixed irritable bowel syndrome: Secondary | ICD-10-CM

## 2023-11-20 LAB — COMPREHENSIVE METABOLIC PANEL WITH GFR
ALT: 20 U/L (ref 0–35)
AST: 21 U/L (ref 0–37)
Albumin: 4.6 g/dL (ref 3.5–5.2)
Alkaline Phosphatase: 46 U/L (ref 39–117)
BUN: 21 mg/dL (ref 6–23)
CO2: 31 meq/L (ref 19–32)
Calcium: 9.9 mg/dL (ref 8.4–10.5)
Chloride: 101 meq/L (ref 96–112)
Creatinine, Ser: 0.57 mg/dL (ref 0.40–1.20)
GFR: 86.23 mL/min (ref >60.00)
Glucose, Bld: 88 mg/dL (ref 70–99)
Potassium: 4.1 meq/L (ref 3.5–5.1)
Sodium: 140 meq/L (ref 135–145)
Total Bilirubin: 0.5 mg/dL (ref 0.2–1.2)
Total Protein: 7.1 g/dL (ref 6.0–8.3)

## 2023-11-20 MED ORDER — DIPHENOXYLATE-ATROPINE 2.5-0.025 MG PO TABS: ORAL_TABLET | ORAL | 0 refills | Status: DC

## 2023-11-20 NOTE — Telephone Encounter (Signed)
 Inbound call from patients husband stating wife was sent a prescription on lomotil  but they would like it sent to the [facility] on [address]  Please advise  Thank you

## 2023-11-20 NOTE — Telephone Encounter (Signed)
 Spoke with the pharmacist. Verbal prescription given.

## 2023-11-20 NOTE — Patient Instructions (Addendum)
 _______________________________________________________  If your blood pressure at your visit was 140/90 or greater, please contact your primary care physician to follow up on this.  _______________________________________________________  If you are age 80 or older, your body mass index should be between 23-30. Your Body mass index is 18.56 kg/m. If this is out of the aforementioned range listed, please consider follow up with your Primary Care Provider.  If you are age 4 or younger, your body mass index should be between 19-25. Your Body mass index is 18.56 kg/m. If this is out of the aformentioned range listed, please consider follow up with your Primary Care Provider.   ________________________________________________________  The [facility] providers would like to encourage you to use MYCHART to communicate with providers for non-urgent requests or questions.  Due to long hold times on the telephone, sending your provider a message by [facility] may be a faster and more efficient way to get a response.  Please allow 48 business hours for a response.  Please remember that this is for non-urgent requests.  _______________________________________________________  [facility] is using a team-based approach to care.  Your team is made up of your doctor and two to three APPS. Our APPS (Nurse Practitioners and Physician Assistants) work with your physician to ensure care continuity for you. They are fully qualified to address your health concerns and develop a treatment plan. They communicate directly with your gastroenterologist to care for you. Seeing the Advanced Practice Practitioners on your physician's team can help you by facilitating care more promptly, often allowing for earlier appointments, access to diagnostic testing, procedures, and other specialty referrals.   Your provider has requested that you go to the basement level for lab work before leaving today. Press B on the  elevator. The lab is located at the first door on the left as you exit the elevator.  We have sent the following medications to your pharmacy for you to pick up at your convenience: Lomotil  4 times a day  Please do budesonide  3 capsule daily  Follow up is 12-08-23 at 1130am  It was a pleasure to see you today!  Thank you for trusting me with your gastrointestinal care!

## 2023-11-20 NOTE — Progress Notes (Signed)
 [name] 77 y.o. 1945/06/23 [id number]  Assessment & Plan:   Encounter Diagnoses  Name Primary?   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  Her recall of what she reported in August was for but [name] documented that the patient was down to 1 or 2 stools a day at that time went on 9 mg budesonide .  She is having worsening problems as she has tapered budesonide . Plan to go back to 9 mg budesonide  daily  Add Lomotil  1 3 times daily AC and at bedtime  Evaluate for other possible causes of diarrhea with alpha gal panel and 7 alpha C4 testing.   She will see me on 12/08/2023 for follow-up.  Orders Placed This Encounter  Procedures   Alpha-Gal Panel   Comp Met (CMET)   7AlphaC4      Subjective:  Gastroenterology summary:  IBS/diarrhea Decades of diarrhea greater than constipation predominant IBS (mixed pattern).  Colon biopsies have shown lymphocytic colitis.  Tissue transglutaminase serology negative. Dyssynergia defecation and weak internal anal sphincter on manometry 2017  Colonoscopy October 2021 normal exam to TI except for 3 diminutive polyps and sigmoid diverticulosis.  Polyps were adenomas, random colon biopsy showed lymphocytic colitis terminal ileal biopsies were normal  GERD with dysphagia  ------------------------------------------------------------------------------------------------------------------- Chief Complaint: Diarrhea  HPI 78 year old woman here with her husband, she has a history of IBS mixed currently having problems with diarrhea and also has a history of lymphocytic colitis.  Also has fibromyalgia, depression and GERD.  She has been followed in this clinic since 2001 and has had problems with  diarrhea and constipation off and on sometimes severe diarrhea.  She has dyssynergy defecation and decreased internal anal sphincter pressure based upon a 2017 anorectal manometry.  She has had negative screening for celiac disease.  She has been seen at [facility].  She has had a rough summer and had severe diarrhea she has been to the ER a couple of times due to complaints of profuse diarrhea and inability for us  to see her.  She was treated with budesonide  9 mg daily because her history of lymphocytic colitis and when last seen 10/09/2023 she was reporting only 1 or 2 watery stools a day down from 18-20.  She does not seem to recall that history today.  Husband is with her.  Now she is having multiple liquid or watery stools again she has been tapering budesonide  and is down at 3 mg daily.  I do not think she is taking any Imodium she may be using some dicyclomine .  She is not entirely clear on history and use of medications.  She continues to have fecal incontinence at night which has been a problem with her diarrhea off and on over the years.  Weight is down approximately 15 pounds since last year but is level at this point.  She does not use artificial sweeteners.  She keeps asking if she could have should avoid lactose but she is not really using milk products.  Note that CT angio of the abdomen and pelvis demonstrated an SMA stenosis that was treated by vascular surgery.  She had balloon angioplasty by Dr. Tedra at [facility] specialist, 10/07/2023.  She did not really have classic abdominal pain with eating and weight  loss pattern.  She has a left inguinal hernia that is bothersome and she is waiting to have that repaired.  Wt Readings from Last 3 Encounters:  11/20/23 118 lb 8 oz (53.8 kg)  11/12/23 119 lb (54 kg)  10/19/23 119 lb (54 kg)     Allergies  Allergen Reactions   Tape Rash   Albuterol      heartburn   Cymbalta  [Duloxetine  Hcl]     Diarrhea, nausea, anxiety worse, shaky    Latex Other (See Comments)   Lyrica  [Pregabalin ] Other (See Comments)    Sedation   Neurontin [Gabapentin] Other (See Comments)    Sedation   Nitrofuran Derivatives Other (See Comments)    tingling   Paxil [Paroxetine Hcl]     Bowel upset, tingling   Prozac  [Fluoxetine  Hcl] Other (See Comments)    Made patient worse and constipation   Current Meds  Medication Sig   aspirin  EC 81 MG tablet Take 1 tablet (81 mg total) by mouth daily. Swallow whole.   Azelastine -Fluticasone  137-50 MCG/ACT SUSP PLACE 1 SPRAY INTO THE NOSE EVERY 12 (TWELVE) HOURS.   budeson-glycopyrrolate -formoterol  (BREZTRI  AEROSPHERE) 160-9-4.8 MCG/ACT AERO Inhale 2 puffs into the lungs 2 (two) times daily.   budesonide  (ENTOCORT EC ) 3 MG 24 hr capsule Take 3 capsules (9 mg total) by mouth daily. (Patient taking differently: Take 3 mg by mouth daily.)   clopidogrel  (PLAVIX ) 75 MG tablet Take 1 tablet (75 mg total) by mouth daily.   Cyanocobalamin  (VITAMIN B-12 PO) Take 1 tablet by mouth daily.   dicyclomine  (BENTYL ) 10 MG capsule Take 10 mg by mouth every 6 (six) hours as needed (abdominal pain/diarrhea).   famotidine  (PEPCID ) 40 MG tablet Take 1 tablet (40 mg total) by mouth 2 (two) times daily as needed for heartburn or indigestion.   fluticasone -salmeterol (WIXELA INHUB) 100-50 MCG/ACT AEPB Inhale 1 puff into the lungs 2 (two) times daily.   hydrOXYzine (ATARAX) 25 MG tablet Take 25 mg by mouth daily as needed.   levalbuterol  (XOPENEX  HFA) 45 MCG/ACT inhaler Inhale 1-2 puffs into the lungs every 4 (four) hours as needed for wheezing.   loperamide (IMODIUM A-D) 2 MG tablet Take 2 mg by mouth as needed for diarrhea or loose stools.   midodrine  (PROAMATINE ) 10 MG tablet Take 1 tablet (10 mg total) by mouth 2 (two) times daily as needed (for SBP 100 or less).   ondansetron  (ZOFRAN -ODT) 4 MG disintegrating tablet Take 1 tablet (4 mg total) by mouth every 8 (eight) hours as needed for nausea or vomiting.   OVER THE COUNTER  MEDICATION Bone Up 1000 mg 2 tabs TID   OVER THE COUNTER MEDICATION B-Right (B-Complex) 1 po daily   pantoprazole  (PROTONIX ) 40 MG tablet Take 40 mg by mouth daily.   PARoxetine (PAXIL) 20 MG tablet Take 20 mg by mouth at bedtime.   potassium chloride  (KLOR-CON ) 20 MEQ packet Take 20 mEq by mouth daily.   Probiotic Product (ALIGN) 4 MG CAPS Take 1 capsule by mouth as needed.   RESTASIS 0.05 % ophthalmic emulsion 1 drop 2 (two) times daily.   rosuvastatin  (CRESTOR ) 5 MG tablet TAKE 1 TABLET (5 MG TOTAL) BY MOUTH DAILY FOR CHOLESTEROL   traZODone  (DESYREL ) 50 MG tablet Take 1 tablet (50 mg total) by mouth at bedtime.   vitamin C (ASCORBIC ACID) 500 MG tablet Take 500 mg by mouth daily.   vitamin E 180 MG (400 UNITS) capsule Take 400 Units by mouth daily.   Past Medical History:  Diagnosis Date   Allergy     Anemia    Anxiety    Asthma    Cataract    bil cateracts removed   Complication of anesthesia    Depression    generalized anxiety disorder   Diverticulosis    Duodenal diverticulum    Family history of adverse reaction to anesthesia    Mother and Daughters- N/V   Fibromyalgia    Gastroesophageal reflux disease with hiatal hernia    Hiatal hernia    History of kidney stones    Hyperlipidemia    IBS (irritable bowel syndrome)    Lymphocytic colitis    Dr Jakie   Migraine headache    Mitral valve prolapse    Neuropathy    Orthostatic hypotension    Osteopenia    BMD ordered by GYN   Osteoporosis    Peripheral neuropathy    treated as RLS by  Neurology   PONV (postoperative nausea and vomiting)    Restless leg syndrome    Schatzki's ring    History of   Sleep apnea    On CPAP, has not been using   Spondylosis    Past Surgical History:  Procedure Laterality Date   ANAL RECTAL MANOMETRY N/A 11/14/2015   Procedure: ANO RECTAL MANOMETRY;  Surgeon: Lupita FORBES Commander, MD;  Location: [facility];  Service: Endoscopy;  Laterality: N/A;   BREAST ENHANCEMENT SURGERY      CATARACT EXTRACTION Bilateral    cataract surgery Left    Dr Camillo   CHOLECYSTECTOMY     COLONOSCOPY     ESOPHAGEAL DILATION     X 2   RENAL INTERVENTION N/A 10/07/2023   Procedure: RENAL INTERVENTION;  Surgeon: Marea Selinda RAMAN, MD;  Location: [facility] INVASIVE CV [facility];  Service: Cardiovascular;  Laterality: N/A;   ROTATOR CUFF REPAIR Left    SINUS ENDO W/FUSION N/A 05/30/2014   Procedure: REVISION  FRONTAL SINUS SURGERY WITH FUSION SCAN;  Surgeon: Alm Bouche, MD;  Location: [facility] OR;  Service: ENT;  Laterality: N/A;   SINUS SURGERY WITH INSTATRAK     x 2   TUBAL LIGATION     UPPER GASTROINTESTINAL ENDOSCOPY     Vaginal cystectomy     x 2   VAGINAL HYSTERECTOMY  05/2007   Vaginal repair, Dr Cary.  Partial  hysterectomy.   VISCERAL ANGIOGRAPHY N/A 10/07/2023   Procedure: VISCERAL ANGIOGRAPHY;  Surgeon: Marea Selinda RAMAN, MD;  Location: [facility];  Service: Cardiovascular;  Laterality: N/A;   WRIST ARTHROSCOPY     Social History   Social History Narrative   HAS REGULAR EXERCISE   DAILY CAFFEINE: 1-2 CUPS   Patient is right handed.   family history includes Anemia in her mother; Coronary artery disease in her brother; Diabetes in her maternal uncle; Heart attack in her father and paternal uncle; Hypertension in her mother; Hypertension (age of onset: 23) in her father; Neuropathy in her mother; Stroke (age of onset: 23) in her father.   Review of Systems As per HPI  Objective:   Physical Exam BP 110/70 (BP Location: Left Arm, Patient Position: Sitting, Cuff Size: Normal)   Pulse 64   Ht 5' 7 (1.702 m)   Wt 118 lb 8 oz (53.8 kg)   BMI 18.56 kg/m  Thin elderly woman no acute distress Abdomen is soft and nontender there is a left inguinal hernia palpable more so when the patient is standing it is reducible and slightly tender

## 2023-11-27 ENCOUNTER — Encounter: Payer: Self-pay | Admitting: Internal Medicine

## 2023-11-27 LAB — ALPHA-GAL PANEL
Allergen, Mutton, f88: <0.1 kU/L
Allergen, Pork, f26: <0.1 kU/L
Beef: <0.1 kU/L
CLASS: 0
CLASS: 0
Class: 0
GALACTOSE-ALPHA-1,3-GALACTOSE IGE*: 0.16 kU/L — ABNORMAL HIGH (ref <0.10)

## 2023-11-27 LAB — INTERPRETATION:

## 2023-11-29 ENCOUNTER — Ambulatory Visit: Payer: Self-pay | Admitting: Internal Medicine

## 2023-12-03 LAB — 7ALPHAC4: 7AlphaC4: 10 ng/mL

## 2023-12-08 ENCOUNTER — Ambulatory Visit: Admitting: Internal Medicine

## 2023-12-08 ENCOUNTER — Encounter: Payer: Self-pay | Admitting: Internal Medicine

## 2023-12-08 VITALS — BP 110/80 | HR 64 | Ht 67.0 in | Wt 118.8 lb

## 2023-12-08 DIAGNOSIS — K5902 Outlet dysfunction constipation: Secondary | ICD-10-CM

## 2023-12-08 DIAGNOSIS — K582 Mixed irritable bowel syndrome: Secondary | ICD-10-CM | POA: Diagnosis not present

## 2023-12-08 DIAGNOSIS — K551 Chronic vascular disorders of intestine: Secondary | ICD-10-CM

## 2023-12-08 DIAGNOSIS — K52832 Lymphocytic colitis: Secondary | ICD-10-CM

## 2023-12-08 DIAGNOSIS — G901 Familial dysautonomia [Riley-Day]: Secondary | ICD-10-CM

## 2023-12-08 NOTE — Patient Instructions (Signed)
 Use the lomotil  as needed if diarrhea more than 2 days.  Keep a food diary.  Decrease the Budesonide , see medicine list.   Check the medicine list, Bring all medicines to next appointment.   I appreciate the opportunity to care for you. Lupita Commander, MD, [facility]

## 2023-12-08 NOTE — Progress Notes (Signed)
 [name] 77 y.o. September 13, 1945 [id number]  Assessment & Plan:   Encounter Diagnoses  Name Primary?   Irritable bowel syndrome with both constipation and diarrhea Yes   Lymphocytic colitis    Dyssynergic defecation-Per 2017 anorectal manometry    Superior mesenteric artery stenosis - successfully treated    Dysautonomia (HCC)     She is back to having more constipation.  I think her primary problem is IBS and I also think lymphocytic colitis is often seen in those patients.  Profuse diarrhea has stopped.  I do not think her problems were related to her SMA stenosis.  She will reduce her budesonide  to 6 mg daily for 2 weeks and then to 3 mg daily.  She is to use generic Lomotil  1 every 6 hours as needed if she has diarrhea for 2 days.  She will follow-up in about 6 weeks, I have asked them to bring medications versus carefully looking at her list and documenting what she is taking and not taking.  I tried to reassure her today.  I think she has poor insight into her medical problems as she has had these for many years and we have explained this issue to her multiple times.  I did explain that there is no one simple fix and she has to try to titrate medications for effect.  She also does have a history of dyssynergic defecation and repeatedly we have recommended pelvic floor PT and urogynecology evaluation but she has never followed through with that.  She is very busy with multiple medical appointments right now and we decided to revisit that.  Question role of dysautonomia, patients with this often have irritable bowel type problems as well.  CC: Geofm Glade PARAS, MD  Subjective:  Gastroenterology summary:   IBS/diarrhea Decades of diarrhea greater than constipation predominant IBS (mixed pattern).  Colon biopsies have shown lymphocytic colitis.  Tissue transglutaminase serology negative. Dyssynergia defecation and weak internal anal sphincter on manometry 2017    Colonoscopy October 2021 normal exam to TI except for 3 diminutive polyps and sigmoid diverticulosis.   Polyps were adenomas, random colon biopsy showed lymphocytic colitis terminal ileal biopsies were normal   GERD with dysphagia Chief Complaint:  HPI The patient is a 78 year old woman with a history of irritable bowel syndrome mixed.  She was seen November 20, 2023 with her husband and he is here again today.  She has had a diagnosis of lymphocytic colitis and was having terrible diarrhea earlier this year.  She frequently has had problems with constipation and then unloading with diarrhea.  She was having more persistent diarrhea this summer as outlined in my November 20, 2023 note.  She also has history of i depression, GERD and fibromyalgia. The patient tells us  she knows she is on her medications that are on the list but she really cannot identify those and her husband is unable to identify those clearly either though when I speak to her and him she is taking generic Lomotil  1 4 times daily regularly and she is still on budesonide  9 mg daily.  She has been having intermittent constipation again going for a few days without defecation and then straining and having small balls of stool.  She describes a chronic lower abdominal soreness and she had some bright red blood on the tissue paper after defecation once or twice.  She has known internal hemorrhoids. She and her husband deny any specific short-term memory changes.  She does not have good  recall of prior gastroenterology or other medical investigations over the years and she has had many.   She had a superior mesenteric artery stenosis treated by vascular surgery in [place].  She has a patent SMA at this time with good flow so it was a technically successful procedure.  She has a left inguinal hernia that is bothering her and she is awaiting the clearance to be able to come off clopidogrel  after the SMA intervention so she can have  hernia repair.  Medications are listed below we are unclear of what she is taking though she thinks she is taking everything on the list.  Current Outpatient Medications:    aspirin  EC 81 MG tablet, Take 1 tablet (81 mg total) by mouth daily. Swallow whole., Disp: 150 tablet, Rfl: 2   Azelastine -Fluticasone  137-50 MCG/ACT SUSP, PLACE 1 SPRAY INTO THE NOSE EVERY 12 (TWELVE) HOURS., Disp: 23 g, Rfl: 8   budeson-glycopyrrolate -formoterol  (BREZTRI  AEROSPHERE) 160-9-4.8 MCG/ACT AERO, Inhale 2 puffs into the lungs 2 (two) times daily., Disp: 10.7 g, Rfl: 11   budesonide  (ENTOCORT EC ) 3 MG 24 hr capsule, Take 2 capsules daily and then on October 22 reduce to 1 capsule daily, Disp: , Rfl:    clopidogrel  (PLAVIX ) 75 MG tablet, Take 1 tablet (75 mg total) by mouth daily., Disp: 30 tablet, Rfl: 11   Cyanocobalamin  (VITAMIN B-12 PO), Take 1 tablet by mouth daily., Disp: , Rfl:    diphenoxylate -atropine  (LOMOTIL ) 2.5-0.025 MG tablet, Take 1 tablet every 6 hours as needed for diarrhea lasting 2 days or more, Disp: , Rfl:    famotidine  (PEPCID ) 40 MG tablet, Take 1 tablet (40 mg total) by mouth 2 (two) times daily as needed for heartburn or indigestion., Disp: 180 tablet, Rfl: 1   fluticasone -salmeterol (WIXELA INHUB) 100-50 MCG/ACT AEPB, Inhale 1 puff into the lungs 2 (two) times daily., Disp: , Rfl:    hydrOXYzine (ATARAX) 25 MG tablet, Take 25 mg by mouth daily as needed., Disp: , Rfl:    levalbuterol  (XOPENEX  HFA) 45 MCG/ACT inhaler, Inhale 1-2 puffs into the lungs every 4 (four) hours as needed for wheezing., Disp: 1 each, Rfl: 12   midodrine  (PROAMATINE ) 10 MG tablet, Take 1 tablet (10 mg total) by mouth 2 (two) times daily as needed (for SBP 100 or less)., Disp: , Rfl:    ondansetron  (ZOFRAN -ODT) 4 MG disintegrating tablet, Take 1 tablet (4 mg total) by mouth every 8 (eight) hours as needed for nausea or vomiting., Disp: 20 tablet, Rfl: 0   OVER THE COUNTER MEDICATION, Bone Up 1000 mg 2 tabs TID, Disp: ,  Rfl:    OVER THE COUNTER MEDICATION, B-Right (B-Complex) 1 po daily, Disp: , Rfl:    pantoprazole  (PROTONIX ) 40 MG tablet, Take 40 mg by mouth daily., Disp: , Rfl:    PARoxetine (PAXIL) 20 MG tablet, Take 20 mg by mouth at bedtime., Disp: , Rfl:    potassium chloride  (KLOR-CON ) 20 MEQ packet, Take 20 mEq by mouth daily., Disp: 30 packet, Rfl: 1   Probiotic Product (ALIGN) 4 MG CAPS, Take 1 capsule by mouth as needed., Disp: , Rfl:    RESTASIS 0.05 % ophthalmic emulsion, 1 drop 2 (two) times daily., Disp: , Rfl:    rosuvastatin  (CRESTOR ) 5 MG tablet, TAKE 1 TABLET (5 MG TOTAL) BY MOUTH DAILY FOR CHOLESTEROL, Disp: 90 tablet, Rfl: 3   traZODone  (DESYREL ) 50 MG tablet, Take 1 tablet (50 mg total) by mouth at bedtime., Disp: 30 tablet, Rfl: 0  vitamin C (ASCORBIC ACID) 500 MG tablet, Take 500 mg by mouth daily., Disp: , Rfl:    vitamin E 180 MG (400 UNITS) capsule, Take 400 Units by mouth daily., Disp: , Rfl:   Allergies  Allergen Reactions   Tape Rash   Albuterol      heartburn   Cymbalta  [Duloxetine  Hcl]     Diarrhea, nausea, anxiety worse, shaky   Latex Other (See Comments)   Lyrica  [Pregabalin ] Other (See Comments)    Sedation   Neurontin [Gabapentin] Other (See Comments)    Sedation   Nitrofuran Derivatives Other (See Comments)    tingling   Paxil [Paroxetine Hcl]     Bowel upset, tingling   Prozac  [Fluoxetine  Hcl] Other (See Comments)    Made patient worse and constipation   No outpatient medications have been marked as taking for the 12/08/23 encounter (Office Visit) with Avram Lupita BRAVO, MD.   Past Medical History:  Diagnosis Date   Allergy     Anemia    Anxiety    Asthma    Cataract    bil cateracts removed   Complication of anesthesia    Depression    generalized anxiety disorder   Diverticulosis    Duodenal diverticulum    Family history of adverse reaction to anesthesia    Mother and Daughters- N/V   Fibromyalgia    Gastroesophageal reflux disease with hiatal  hernia    Hiatal hernia    History of kidney stones    Hyperlipidemia    IBS (irritable bowel syndrome)    Lymphocytic colitis    Dr Jakie   Migraine headache    Mitral valve prolapse    Neuropathy    Orthostatic hypotension    Osteopenia    BMD ordered by GYN   Osteoporosis    Peripheral neuropathy    treated as RLS by  Neurology   PONV (postoperative nausea and vomiting)    Restless leg syndrome    Schatzki's ring    History of   Sleep apnea    On CPAP, has not been using   Spondylosis    Past Surgical History:  Procedure Laterality Date   ANAL RECTAL MANOMETRY N/A 11/14/2015   Procedure: ANO RECTAL MANOMETRY;  Surgeon: Lupita BRAVO Avram, MD;  Location: [facility];  Service: Endoscopy;  Laterality: N/A;   BREAST ENHANCEMENT SURGERY     CATARACT EXTRACTION Bilateral    cataract surgery Left    Dr Camillo   CHOLECYSTECTOMY     COLONOSCOPY     ESOPHAGEAL DILATION     X 2   RENAL INTERVENTION N/A 10/07/2023   Procedure: RENAL INTERVENTION;  Surgeon: Marea Selinda RAMAN, MD;  Location: [facility];  Service: Cardiovascular;  Laterality: N/A;   ROTATOR CUFF REPAIR Left    SINUS ENDO W/FUSION N/A 05/30/2014   Procedure: REVISION  FRONTAL SINUS SURGERY WITH FUSION SCAN;  Surgeon: Alm Bouche, MD;  Location[facility] OR;  Service: ENT;  Laterality: N/A;   SINUS SURGERY WITH INSTATRAK     x 2   TUBAL LIGATION     UPPER GASTROINTESTINAL ENDOSCOPY     Vaginal cystectomy     x 2   VAGINAL HYSTERECTOMY  05/2007   Vaginal repair, Dr Cary.  Partial  hysterectomy.   VISCERAL ANGIOGRAPHY N/A 10/07/2023   Procedure: VISCERAL ANGIOGRAPHY;  Surgeon: Marea Selinda RAMAN, MD;  Location: [facility];  Service: Cardiovascular;  Laterality: N/A;   WRIST ARTHROSCOPY     Social  History   Social History Narrative   HAS REGULAR EXERCISE   DAILY CAFFEINE: 1-2 CUPS   Patient is right handed.   family history includes Anemia in her mother; Coronary artery disease in her brother; Diabetes  in her maternal uncle; Heart attack in her father and paternal uncle; Hypertension in her mother; Hypertension (age of onset: 36) in her father; Neuropathy in her mother; Stroke (age of onset: 28) in her father.   Review of Systems As per HPI  Objective:   Physical Exam BP 110/80   Pulse 64   Ht 5' 7 (1.702 m)   Wt 118 lb 12.8 oz (53.9 kg)   BMI 18.61 kg/m  NAD Abd soft and NT Did not test for hernia today it does not herniate in the supine position  I spent 36 minutes of time, including in depth chart review, independent review of results as outlined above, communicating results with the patient directly, face-to-face time with the patient, coordinating care, ordering studies and medications as appropriate, and documentation.

## 2023-12-11 ENCOUNTER — Telehealth: Payer: Self-pay | Admitting: Internal Medicine

## 2023-12-11 NOTE — Telephone Encounter (Signed)
 Requesting to speak with a nurse in regards to abdominal pain. Please advise.

## 2023-12-14 ENCOUNTER — Ambulatory Visit (INDEPENDENT_AMBULATORY_CARE_PROVIDER_SITE_OTHER): Admitting: Audiology

## 2023-12-14 ENCOUNTER — Ambulatory Visit (INDEPENDENT_AMBULATORY_CARE_PROVIDER_SITE_OTHER): Admitting: Otolaryngology

## 2023-12-14 ENCOUNTER — Encounter (INDEPENDENT_AMBULATORY_CARE_PROVIDER_SITE_OTHER): Payer: Self-pay | Admitting: Otolaryngology

## 2023-12-14 ENCOUNTER — Telehealth (INDEPENDENT_AMBULATORY_CARE_PROVIDER_SITE_OTHER): Payer: Self-pay | Admitting: Otolaryngology

## 2023-12-14 VITALS — BP 117/77 | HR 84 | Temp 98.0°F

## 2023-12-14 DIAGNOSIS — R42 Dizziness and giddiness: Secondary | ICD-10-CM

## 2023-12-14 DIAGNOSIS — H903 Sensorineural hearing loss, bilateral: Secondary | ICD-10-CM

## 2023-12-14 DIAGNOSIS — H9313 Tinnitus, bilateral: Secondary | ICD-10-CM

## 2023-12-14 NOTE — Telephone Encounter (Signed)
 The patient called in, she forgot to ask for a test she describes as putting you on a board and aligning the crystals in your ears.  There was no note of vertigo/dizziness in her referral, and I am unsure if this was discussed at the appointment or can be taken care of in the current referral out from Dr Soldatova. Please advise the patient on next step, if she needs to come back in for this discussion?
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
## 2023-12-14 NOTE — Telephone Encounter (Signed)
 Spoke to patient regarding PT treatment. Patient understood.

## 2023-12-14 NOTE — Telephone Encounter (Signed)
 The pt wanted to make Dr Avram aware that she is seeing a surgeon to discuss her hernia.  FYI Dr Avram
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
## 2023-12-21 ENCOUNTER — Ambulatory Visit: Attending: Otolaryngology

## 2023-12-21 VITALS — BP 134/78 | HR 62

## 2023-12-21 DIAGNOSIS — R2681 Unsteadiness on feet: Secondary | ICD-10-CM | POA: Diagnosis present

## 2023-12-21 DIAGNOSIS — R42 Dizziness and giddiness: Secondary | ICD-10-CM | POA: Insufficient documentation
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
## 2023-12-22 ENCOUNTER — Encounter: Payer: Self-pay | Admitting: Internal Medicine

## 2023-12-22 ENCOUNTER — Encounter: Payer: Self-pay | Admitting: Audiology

## 2023-12-22 NOTE — Progress Notes (Unsigned)
 Subjective:    Patient ID: [name], female    DOB: 1945/10/25, 78 y.o.   MRN: [id number]     HPI [name] is here for follow up of her chronic medical problems.  Saw ENT-hearing loss, tinnitus and hearing aids recommended.  Referred to audiology.  Dizziness/lightheadedness and vertigo-likely not ear related.?  Blood pressure, vascular related or neuropathy.  Could benefit from vestibular rehab and evaluation for BPPV although suspicion was low for that-referred to vestibular therapy by ENT.  Advised to rule out other causes.  Weight loss  Hernia Vascular Dr. GLENWOOD successful intervention of SMA.  Renal artery stenosis - BP ok - hypotension - will just monitor.  FMD.       Medications and allergies reviewed with patient and updated if appropriate.  Current Outpatient Medications on File Prior to Visit  Medication Sig Dispense Refill   aspirin  EC 81 MG tablet Take 1 tablet (81 mg total) by mouth daily. Swallow whole. 150 tablet 2   Azelastine -Fluticasone  137-50 MCG/ACT SUSP PLACE 1 SPRAY INTO THE NOSE EVERY 12 (TWELVE) HOURS. 23 g 8   budeson-glycopyrrolate -formoterol  (BREZTRI  AEROSPHERE) 160-9-4.8 MCG/ACT AERO Inhale 2 puffs into the lungs 2 (two) times daily. 10.7 g 11   budesonide  (ENTOCORT EC ) 3 MG 24 hr capsule Take 2 capsules daily and then on October 22 reduce to 1 capsule daily     clopidogrel  (PLAVIX ) 75 MG tablet Take 1 tablet (75 mg total) by mouth daily. 30 tablet 11   Cyanocobalamin  (VITAMIN B-12 PO) Take 1 tablet by mouth daily.     diphenoxylate -atropine  (LOMOTIL ) 2.5-0.025 MG tablet Take 1 tablet every 6 hours as needed for diarrhea lasting 2 days or more     famotidine  (PEPCID ) 40 MG tablet Take 1 tablet (40 mg total) by mouth 2 (two) times daily as needed for heartburn or indigestion. 180 tablet 1   fluticasone -salmeterol (WIXELA INHUB) 100-50 MCG/ACT AEPB Inhale 1 puff into the lungs 2 (two) times daily.     hydrOXYzine (ATARAX) 25 MG tablet  Take 25 mg by mouth daily as needed.     levalbuterol  (XOPENEX  HFA) 45 MCG/ACT inhaler Inhale 1-2 puffs into the lungs every 4 (four) hours as needed for wheezing. 1 each 12   midodrine  (PROAMATINE ) 10 MG tablet Take 1 tablet (10 mg total) by mouth 2 (two) times daily as needed (for SBP 100 or less).     ondansetron  (ZOFRAN -ODT) 4 MG disintegrating tablet Take 1 tablet (4 mg total) by mouth every 8 (eight) hours as needed for nausea or vomiting. 20 tablet 0   OVER THE COUNTER MEDICATION Bone Up 1000 mg 2 tabs TID     OVER THE COUNTER MEDICATION B-Right (B-Complex) 1 po daily     pantoprazole  (PROTONIX ) 40 MG tablet Take 40 mg by mouth daily.     PARoxetine (PAXIL) 20 MG tablet Take 20 mg by mouth at bedtime.     potassium chloride  (KLOR-CON ) 20 MEQ packet Take 20 mEq by mouth daily. 30 packet 1   Probiotic Product (ALIGN) 4 MG CAPS Take 1 capsule by mouth as needed.     RESTASIS 0.05 % ophthalmic emulsion 1 drop 2 (two) times daily.     rosuvastatin  (CRESTOR ) 5 MG tablet TAKE 1 TABLET (5 MG TOTAL) BY MOUTH DAILY FOR CHOLESTEROL 90 tablet 3   traZODone  (DESYREL ) 50 MG tablet Take 1 tablet (50 mg total) by mouth at bedtime. 30 tablet 0   vitamin C (ASCORBIC ACID)  500 MG tablet Take 500 mg by mouth daily.     vitamin E 180 MG (400 UNITS) capsule Take 400 Units by mouth daily.     [DISCONTINUED] Hyoscyamine -Phenyltoloxamine (DIGEX NF) 0.0625-15 MG CAPS Take one tablet by mouth as needed for abdominal cramping 24 each 0   No current facility-administered medications on file prior to visit.     Review of Systems     Objective:  There were no vitals filed for this visit. BP Readings from Last 3 Encounters:  12/21/23 134/78  12/14/23 117/77  12/08/23 110/80   Wt Readings from Last 3 Encounters:  12/08/23 118 lb 12.8 oz (53.9 kg)  11/20/23 118 lb 8 oz (53.8 kg)  11/12/23 119 lb (54 kg)   There is no height or weight on file to calculate BMI.    Physical Exam     Lab Results   Component Value Date   WBC 7.2 09/22/2023   HGB 11.8 (L) 09/22/2023   HCT 35.5 (L) 09/22/2023   PLT 234 09/22/2023   GLUCOSE 88 11/20/2023   CHOL 171 06/03/2023   TRIG 81.0 06/03/2023   HDL 62.30 06/03/2023   LDLCALC 93 06/03/2023   ALT 20 11/20/2023   AST 21 11/20/2023   NA 140 11/20/2023   K 4.1 11/20/2023   CL 101 11/20/2023   CREATININE 0.57 11/20/2023   BUN 21 11/20/2023   CO2 31 11/20/2023   TSH 3.82 06/03/2023   INR 1.08 01/09/2009   HGBA1C 5.9 06/03/2023     Assessment & Plan:    See Problem List for Assessment and Plan of chronic medical problems.

## 2023-12-23 ENCOUNTER — Ambulatory Visit (INDEPENDENT_AMBULATORY_CARE_PROVIDER_SITE_OTHER): Admitting: Internal Medicine

## 2023-12-23 VITALS — BP 112/70 | HR 68 | Temp 97.9°F | Ht 67.0 in | Wt 118.0 lb

## 2023-12-23 DIAGNOSIS — I950 Idiopathic hypotension: Secondary | ICD-10-CM

## 2023-12-23 DIAGNOSIS — H814 Vertigo of central origin: Secondary | ICD-10-CM | POA: Diagnosis not present

## 2023-12-23 NOTE — Assessment & Plan Note (Signed)
 Chronic dizziness Saw ENT and not felt to be BPPV-not thought to be ear related Did have 1 therapy session and therapist thought it could be vascular in nature, especially given her vascular issues MRI head negative Will get CT angio head and neck Most of her dizziness seems to be related to head movements and changes in position Will see what CT angio shows and evaluate further if needed

## 2023-12-23 NOTE — Patient Instructions (Addendum)
  [   Flu immunization administered today.        A Ct scan of your head and neck was ordered and someone will call you to schedule an appointment.

## 2023-12-23 NOTE — Assessment & Plan Note (Signed)
 Chronic Blood pressure variable Systolic blood pressure often less than 100 Continue midodrine  when SBP less than 100-discussed that if SBP is in low 100s and she is symptomatic she should take the medication as well Continue to monitor BP at home

## 2023-12-26 ENCOUNTER — Other Ambulatory Visit: Payer: Self-pay | Admitting: Physician Assistant

## 2023-12-30 ENCOUNTER — Telehealth: Payer: Self-pay

## 2023-12-30 ENCOUNTER — Ambulatory Visit
Admission: RE | Admit: 2023-12-30 | Discharge: 2023-12-30 | Disposition: A | Discharge status: Home / Self Care | Source: Ambulatory Visit | Attending: Internal Medicine | Admitting: Internal Medicine

## 2023-12-30 DIAGNOSIS — H814 Vertigo of central origin: Secondary | ICD-10-CM

## 2023-12-30 NOTE — Telephone Encounter (Signed)
 Copied from CRM #[id number]. Topic: General - Other >> Dec 30, 2023  1:05 PM Mercedes MATSU wrote: Reason for CRM: Patient can not get iv access at her MRI appt and nurse would like to know what else to do. Is requesting call back at [phone]. The patient is currently on the table waiting to get scanned.

## 2023-12-31 ENCOUNTER — Other Ambulatory Visit (INDEPENDENT_AMBULATORY_CARE_PROVIDER_SITE_OTHER): Payer: Self-pay | Admitting: Nurse Practitioner

## 2023-12-31 DIAGNOSIS — I774 Celiac artery compression syndrome: Secondary | ICD-10-CM

## 2023-12-31 DIAGNOSIS — I773 Arterial fibromuscular dysplasia: Secondary | ICD-10-CM

## 2023-12-31 NOTE — Telephone Encounter (Unsigned)
 Copied from CRM #[id number]. Topic: General - Other >> Dec 30, 2023  1:05 PM Mercedes MATSU wrote: Reason for CRM: Patient can not get iv access at her MRI appt and nurse would like to know what else to do. Is requesting call back at [phone]. The patient is currently on the table waiting to get scanned. >> Dec 31, 2023  9:07 AM Victoria A wrote: Patient called to follow up on the nurse calling her to let her know what she is to do regarding Imaging please call [phone].

## 2023-12-31 NOTE — Telephone Encounter (Signed)
 Ordered

## 2023-12-31 NOTE — Telephone Encounter (Signed)
 Spoke with patient today.

## 2023-12-31 NOTE — Addendum Note (Signed)
 Addended by: GEOFM GLADE PARAS on: 12/31/2023 11:55 AM   Modules accepted: Orders
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
## 2024-01-04 ENCOUNTER — Ambulatory Visit (HOSPITAL_COMMUNITY)
Admission: RE | Admit: 2024-01-04 | Discharge: 2024-01-04 | Disposition: A | Discharge status: Home / Self Care | Source: Ambulatory Visit | Attending: Internal Medicine | Admitting: Internal Medicine

## 2024-01-04 ENCOUNTER — Ambulatory Visit: Admitting: Cardiovascular Disease

## 2024-01-04 ENCOUNTER — Ambulatory Visit (HOSPITAL_COMMUNITY)

## 2024-01-04 DIAGNOSIS — H814 Vertigo of central origin: Secondary | ICD-10-CM | POA: Diagnosis present

## 2024-01-04 DIAGNOSIS — R42 Dizziness and giddiness: Secondary | ICD-10-CM

## 2024-01-04 DIAGNOSIS — E782 Mixed hyperlipidemia: Secondary | ICD-10-CM

## 2024-01-04 DIAGNOSIS — K551 Chronic vascular disorders of intestine: Secondary | ICD-10-CM

## 2024-01-04 DIAGNOSIS — R002 Palpitations: Secondary | ICD-10-CM

## 2024-01-04 DIAGNOSIS — I951 Orthostatic hypotension: Secondary | ICD-10-CM

## 2024-01-04 DIAGNOSIS — R079 Chest pain, unspecified: Secondary | ICD-10-CM

## 2024-01-04 DIAGNOSIS — R0602 Shortness of breath: Secondary | ICD-10-CM

## 2024-01-04 DIAGNOSIS — I700 Atherosclerosis of aorta: Secondary | ICD-10-CM

## 2024-01-04 MED ORDER — IOHEXOL 350 MG/ML SOLN
75.0000 mL | Freq: Once | INTRAVENOUS | Status: AC | PRN
  Administered 2024-01-04: 75 mL via INTRAVENOUS

## 2024-01-05 ENCOUNTER — Encounter (INDEPENDENT_AMBULATORY_CARE_PROVIDER_SITE_OTHER): Payer: Self-pay | Admitting: Vascular Surgery

## 2024-01-05 ENCOUNTER — Ambulatory Visit (INDEPENDENT_AMBULATORY_CARE_PROVIDER_SITE_OTHER)

## 2024-01-05 ENCOUNTER — Ambulatory Visit (INDEPENDENT_AMBULATORY_CARE_PROVIDER_SITE_OTHER): Admitting: Vascular Surgery

## 2024-01-05 ENCOUNTER — Encounter (INDEPENDENT_AMBULATORY_CARE_PROVIDER_SITE_OTHER)

## 2024-01-05 VITALS — BP 106/66 | HR 53 | Resp 18 | Ht 67.0 in | Wt 122.0 lb

## 2024-01-05 DIAGNOSIS — K551 Chronic vascular disorders of intestine: Secondary | ICD-10-CM | POA: Diagnosis not present

## 2024-01-05 DIAGNOSIS — I773 Arterial fibromuscular dysplasia: Secondary | ICD-10-CM

## 2024-01-05 DIAGNOSIS — I6529 Occlusion and stenosis of unspecified carotid artery: Secondary | ICD-10-CM | POA: Insufficient documentation

## 2024-01-05 DIAGNOSIS — I6523 Occlusion and stenosis of bilateral carotid arteries: Secondary | ICD-10-CM

## 2024-01-05 DIAGNOSIS — I774 Celiac artery compression syndrome: Secondary | ICD-10-CM

## 2024-01-05 DIAGNOSIS — E782 Mixed hyperlipidemia: Secondary | ICD-10-CM

## 2024-01-05 NOTE — Progress Notes (Signed)
 MRN : [id number]  [name] is a 78 y.o. (30-Oct-1945) female who presents with chief complaint of  Chief Complaint  Patient presents with   Follow-up    Follow up renal + mesenteric + Carotid  .  History of Present Illness: Patient returns today in follow up of multiple vascular issues.  She is doing well today.  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  Her hernia is still bothering her.  She had an SMA intervention at multiple sites of the vessel with angioplasty.  She was also found to have some fibromuscular dysplasia in the right renal artery on angiogram. Duplex today showed normal velocities in the celiac and superior mesenteric artery.  Duplex also found the renal artery to have normal flow despite the fibromuscular dysplasia changes seen on angiogram.  No hemodynamically significant stenosis was identified in either renal artery and the kidney lengths were equal and normal. We also checked the patient for carotid disease today.  No recent focal neurologic symptoms. Specifically, the patient denies amaurosis fugax, speech or swallowing difficulties, or arm or leg weakness or numbness. Carotid duplex today reveals minimal carotid artery stenosis bilaterally with a mild amount of plaque and relatively normal velocities consistent with low and 1 to 39% ICA stenosis bilaterally.    Current Outpatient Medications  Medication Sig Dispense Refill   aspirin  EC 81 MG tablet Take 1 tablet (81 mg total) by mouth daily. Swallow whole. 150 tablet 2   Azelastine -Fluticasone  137-50 MCG/ACT SUSP PLACE 1 SPRAY INTO THE NOSE EVERY 12 (TWELVE) HOURS. 23 g 8   budeson-glycopyrrolate -formoterol  (BREZTRI  AEROSPHERE) 160-9-4.8 MCG/ACT AERO Inhale 2 puffs into the lungs 2 (two) times daily. 10.7 g 11   budesonide  (ENTOCORT EC ) 3 MG 24 hr capsule Take 2 capsules daily and then on October 22 reduce to 1  capsule daily     clopidogrel  (PLAVIX ) 75 MG tablet Take 1 tablet (75 mg total) by mouth daily. 30 tablet 11   Cyanocobalamin  (VITAMIN B-12 PO) Take 1 tablet by mouth daily.     diphenoxylate -atropine  (LOMOTIL ) 2.5-0.025 MG tablet Take 1 tablet every 6 hours as needed for diarrhea lasting 2 days or more     famotidine  (PEPCID ) 40 MG tablet Take 1 tablet (40 mg total) by mouth 2 (two) times daily as needed for heartburn or indigestion. 180 tablet 1   fluticasone -salmeterol (WIXELA INHUB) 100-50 MCG/ACT AEPB Inhale 1 puff into the lungs 2 (two) times daily.     hydrOXYzine (ATARAX) 25 MG tablet Take 25 mg by mouth daily as needed.     levalbuterol  (XOPENEX  HFA) 45 MCG/ACT inhaler Inhale 1-2 puffs into the lungs every 4 (four) hours as needed for wheezing. 1 each 12   midodrine  (PROAMATINE ) 10 MG tablet Take 1 tablet (10 mg total) by mouth 2 (two) times daily as needed (for SBP 100 or less).     ondansetron  (ZOFRAN -ODT) 4 MG disintegrating tablet Take 1 tablet (4 mg total) by mouth every 8 (eight) hours as needed for nausea or vomiting. 20 tablet 0   OVER THE COUNTER MEDICATION Bone Up 1000 mg 2 tabs TID     OVER THE COUNTER MEDICATION B-Right (B-Complex) 1 po daily     pantoprazole  (PROTONIX ) 40 MG tablet Take 40 mg by mouth daily.     PARoxetine (PAXIL) 20 MG tablet Take 20 mg by mouth at bedtime.     potassium  chloride (KLOR-CON ) 20 MEQ packet DISSOLVE CONTENTS OF 1 PACKET DAILY 90 packet 1   Probiotic Product (ALIGN) 4 MG CAPS Take 1 capsule by mouth as needed.     QUEtiapine  (SEROQUEL ) 25 MG tablet      RESTASIS 0.05 % ophthalmic emulsion 1 drop 2 (two) times daily.     rosuvastatin  (CRESTOR ) 5 MG tablet TAKE 1 TABLET (5 MG TOTAL) BY MOUTH DAILY FOR CHOLESTEROL 90 tablet 3   traZODone  (DESYREL ) 100 MG tablet Take 100 mg by mouth at bedtime.     traZODone  (DESYREL ) 50 MG tablet Take 1 tablet (50 mg total) by mouth at bedtime. 30 tablet 0   vitamin C (ASCORBIC ACID) 500 MG tablet Take 500 mg by  mouth daily.     vitamin E 180 MG (400 UNITS) capsule Take 400 Units by mouth daily.     No current facility-administered medications for this visit.    Past Medical History:  Diagnosis Date   Allergy     Anemia    Anxiety    Asthma    Cataract    bil cateracts removed   Complication of anesthesia    Depression    generalized anxiety disorder   Diverticulosis    Duodenal diverticulum    Family history of adverse reaction to anesthesia    Mother and Daughters- N/V   Fibromyalgia    Gastroesophageal reflux disease with hiatal hernia    Hiatal hernia    History of kidney stones    Hyperlipidemia    IBS (irritable bowel syndrome)    Lymphocytic colitis    Dr Jakie   Migraine headache    Mitral valve prolapse    Neuropathy    Orthostatic hypotension    Osteopenia    BMD ordered by GYN   Osteoporosis    Peripheral neuropathy    treated as RLS by  Neurology   PONV (postoperative nausea and vomiting)    Restless leg syndrome    Schatzki's ring    History of   Sleep apnea    On CPAP, has not been using   Spondylosis     Past Surgical History:  Procedure Laterality Date   ANAL RECTAL MANOMETRY N/A 11/14/2015   Procedure: ANO RECTAL MANOMETRY;  Surgeon: Lupita FORBES Commander, MD;  Location: [facility];  Service: Endoscopy;  Laterality: N/A;   BREAST ENHANCEMENT SURGERY     CATARACT EXTRACTION Bilateral    cataract surgery Left    Dr Camillo   CHOLECYSTECTOMY     COLONOSCOPY     ESOPHAGEAL DILATION     X 2   RENAL INTERVENTION N/A 10/07/2023   Procedure: RENAL INTERVENTION;  Surgeon: Marea Selinda RAMAN, MD;  Location: [facility];  Service: Cardiovascular;  Laterality: N/A;   ROTATOR CUFF REPAIR Left    SINUS ENDO W/FUSION N/A 05/30/2014   Procedure: REVISION  FRONTAL SINUS SURGERY WITH FUSION SCAN;  Surgeon: Alm Bouche, MD;  Location: [facility];  Service: ENT;  Laterality: N/A;   SINUS SURGERY WITH INSTATRAK     x 2   TUBAL LIGATION     UPPER GASTROINTESTINAL  ENDOSCOPY     Vaginal cystectomy     x 2   VAGINAL HYSTERECTOMY  05/2007   Vaginal repair, Dr Cary.  Partial  hysterectomy.   VISCERAL ANGIOGRAPHY N/A 10/07/2023   Procedure: VISCERAL ANGIOGRAPHY;  Surgeon: Marea Selinda RAMAN, MD;  Location: [facility];  Service: Cardiovascular;  Laterality: N/A;   WRIST ARTHROSCOPY  Social History   Tobacco Use   Smoking status: Former    Current packs/day: 0.00    Average packs/day: 0.5 packs/day for 15.0 years (7.5 ttl pk-yrs)    Types: Cigarettes    Start date: 03/04/1983    Quit date: 03/03/1998    Years since quitting: 25.8    Passive exposure: Past   Smokeless tobacco: Never   Tobacco comments:    smoked 1973- 2006, up to <  1  ppd  Vaping Use   Vaping status: Never Used  Substance Use Topics   Alcohol use: No    Alcohol/week: 0.0 standard drinks of alcohol   Drug use: No       Family History  Problem Relation Age of Onset   Hypertension Father 61   Stroke Father 61   Heart attack Father         ? in 63s   Hypertension Mother    Neuropathy Mother    Anemia Mother    Coronary artery disease Brother        Stent placement in 35s   Diabetes Maternal Uncle    Heart attack Paternal [employer] , ? age   Colon cancer Neg Hx    Seizures Neg Hx    Esophageal cancer Neg Hx    Rectal cancer Neg Hx    Stomach cancer Neg Hx      Allergies  Allergen Reactions   Tape Rash   Albuterol      heartburn   Cymbalta  [Duloxetine  Hcl]     Diarrhea, nausea, anxiety worse, shaky   Latex Other (See Comments)   Lyrica  [Pregabalin ] Other (See Comments)    Sedation   Neurontin [Gabapentin] Other (See Comments)    Sedation   Nitrofuran Derivatives Other (See Comments)    tingling   Paxil [Paroxetine Hcl]     Bowel upset, tingling   Prozac  [Fluoxetine  Hcl] Other (See Comments)    Made patient worse and constipation      REVIEW OF SYSTEMS (Negative unless checked)   Constitutional: [] Weight loss  [] Fever   [] Chills Cardiac: [] Chest pain   [] Chest pressure   [] Palpitations   [] Shortness of breath when laying flat   [] Shortness of breath at rest   [] Shortness of breath with exertion. Vascular:  [] Pain in legs with walking   [] Pain in legs at rest   [] Pain in legs when laying flat   [] Claudication   [] Pain in feet when walking  [] Pain in feet at rest  [] Pain in feet when laying flat   [] History of DVT   [] Phlebitis   [] Swelling in legs   [] Varicose veins   [] Non-healing ulcers Pulmonary:   [] Uses home oxygen   [] Productive cough   [] Hemoptysis   [] Wheeze  [x] COPD   [] Asthma Neurologic:  [] Dizziness  [] Blackouts   [] Seizures   [] History of stroke   [] History of TIA  [] Aphasia   [] Temporary blindness   [] Dysphagia   [] Weakness or numbness in arms   [] Weakness or numbness in legs Musculoskeletal:  [x] Arthritis   [] Joint swelling   [] Joint pain   [] Low back pain Hematologic:  [x] Easy bruising  [] Easy bleeding   [] Hypercoagulable state   [x] Anemic  [] Hepatitis Gastrointestinal:  [] Blood in stool   [] Vomiting blood  [x] Gastroesophageal reflux/heartburn   [x] Abdominal pain Genitourinary:  [] Chronic kidney disease   [] Difficult urination  [] Frequent urination  [] Burning with urination   [] Hematuria Skin:  [] Rashes   [] Ulcers   []   Wounds Psychological:  [x] History of anxiety   [x]  History of major depression.  Physical Examination  BP 106/66 (BP Location: Left Arm, Patient Position: Sitting, Cuff Size: Normal)   Pulse (!) 53   Resp 18   Ht 5' 7 (1.702 m)   Wt 122 lb (55.3 kg)   BMI 19.11 kg/m  Gen:  WD/WN, NAD. Appears younger than stated age. Head: [place]/AT, No temporalis wasting. Ear/Nose/Throat: Hearing grossly intact, nares w/o erythema or drainage Eyes: Conjunctiva clear. Sclera non-icteric Neck: Supple.  Trachea midline Pulmonary:  Good air movement, no use of accessory muscles.  Cardiac: bradycardic Vascular:  Vessel Right Left  Radial Palpable Palpable                                    Gastrointestinal: soft, non-tender/non-distended. No guarding/reflex.  Musculoskeletal: M/S 5/5 throughout.  No deformity or atrophy.  No edema. Neurologic: Sensation grossly intact in extremities.  Symmetrical.  Speech is fluent.  Psychiatric: Judgment intact, Mood & affect appropriate for pt's clinical situation. Dermatologic: No rashes or ulcers noted.  No cellulitis or open wounds.      Labs Recent Results (from the past 2160 hours)  HM MAMMOGRAPHY     Status: None   Collection Time: 10/28/23 10:11 AM  Result Value Ref Range   HM Mammogram 0-4 Bi-Rad 0-4 Bi-Rad, Self Reported Normal    Comment: ABSTRACTED BY HIM  HM DEXA SCAN     Status: None   Collection Time: 10/28/23  2:44 PM  Result Value Ref Range   HM Dexa Scan Osteoporosis     Comment: Abstracted by HIM  [id number]     Status: None   Collection Time: 11/20/23  3:27 PM  Result Value Ref Range   7AlphaC4 10 ng/mL    Comment:  Reference Interval: 1.8-57 ng/mL  Serum 7AlphaC4 is a surrogate for stool bile acids. Elevated concentrations (> 57 ng/mL) are associated with a higher probability of excess colonic bile acids as the cause of diarrhea which usually responds to bile acid sequestrants. Bile acid diarrhea may still occur in the setting of lower 7AlphaC4 concentrations.  Serum 7AlphaC4 levels, <15 ng/mL and >48 ng/mL, have been reported to have a negative predictive value of 85% and a positive predictive value of 82%, respectively, for bile acid diarrhea.  Smitty JRF. Am JINNY Bruns. 2020;115(12):1974-1975  This test was developed and its performance characteristics determined by [facility]. It has not been cleared or approved by the Food and Drug Administration.   Comp Met (CMET)     Status: None   Collection Time: 11/20/23  3:27 PM  Result Value Ref Range   Sodium 140 135 - 145 mEq/L   Potassium 4.1 3.5 - 5.1 mEq/L   Chloride 101 96 - 112 mEq/L   CO2 31 19 - 32 mEq/L   Glucose, Bld 88 70 - 99  mg/dL   BUN 21 6 - 23 mg/dL   Creatinine, Ser 9.42 0.40 - 1.20 mg/dL   Total Bilirubin 0.5 0.2 - 1.2 mg/dL   Alkaline Phosphatase 46 39 - 117 U/L   AST 21 0 - 37 U/L   ALT 20 0 - 35 U/L   Total Protein 7.1 6.0 - 8.3 g/dL   Albumin 4.6 3.5 - 5.2 g/dL   GFR 13.76 >39.99 mL/min    Comment: Calculated using the CKD-EPI Creatinine Equation (2021)   Calcium  9.9 8.4 - 10.5 mg/dL  Alpha-Gal Panel     Status: Abnormal   Collection Time: 11/20/23  3:27 PM  Result Value Ref Range   Beef <0.10 kU/L   CLASS 0    Allergen, Mutton, f88 <0.10 kU/L   Class 0    Allergen, Pork, f26 <0.10 kU/L   CLASS 0    GALACTOSE-ALPHA-1,3-GALACTOSE IGE* 0.16 (H) <0.10 kU/L    Comment: . Results above 0.1 kU/L indicate an allergen-specific IgE sensitization to galactose-a-1,3-galactose, and such patients are at risk for delayed allergic reactions following beef, pork, or lamb consumption. Circulating IgE antibodies may remain undetectable despite a convincing clinical history because these antibodies may be directed towards allergens revealed or altered during industrial processing, cooking, or digestion and therefore do not exist in the original food for which the patient is tested. Sometimes individuals diagnosed with chronic urticaria may develop IgE antibodies directed against human thyroglobulin. Such antibodies may cross-react with the bovine thyroglobulin used in ImmunoCAP(R) Allergen o215, alpha-Gal, leading to a false-positive test result. A definitive diagnosis should be based on the evaluation of both clinical and laboratory findings and not on any single diagnostic method. Additional information can be found at [email or web address]   Interpretation:     Status: None   Collection Time: 11/20/23  3:27 PM  Result Value Ref Range   Interpretation      Comment: . Specific                        Level of Allergen IGE Class      kU/L             Specific IGE Antibody  -----         ---------         -------------------   0              <0.10           Absent/Undetectable   0/1        0.10-0.34           Very Low Level   1          0.35-0.69           Low Level   2          0.70-3.49           Moderate Level   3          3.50-17.4           High Level   4          17.5-49.9           Very High Level   5            50-100            Very High Level   6              >100            Very High Level . The clinical relevance of allergen results of 0.10-0.34 kU/L are undetermined and intended for  specialist use. . Allergens denoted with a ** include results using one or more analyte specific reagents. In those cases, the test was developed and its analytical performance characteristics have been determined by [employer]. It has not been cleared or approved by the U.S. Food and Drug Administration. This assay  has been v alidated pursuant to the [employer]  and is used for clinical purposes.  Radiology No results found.  Assessment/Plan  Chronic mesenteric ischemia Duplex today showed normal velocities in the celiac and superior mesenteric artery.  Doing well status post SMA intervention with decreased abdominal pain and markedly improved food tolerance.  Now gaining weight.  Up 5 pounds from last visit.  She will continue Plavix  and a statin agent.  We are beyond 90 days from her intervention, so I am okay stopping the aspirin  at her request.  Follow-up in 6 months with carotid duplex.  Fibromuscular dysplasia of renal artery Duplex also found the renal artery to have normal flow despite the fibromuscular dysplasia changes seen on angiogram.  No hemodynamically significant stenosis was identified in either renal artery and the kidney lengths were equal and normal.  Recheck with duplex at her next visit in 6 months.  Carotid stenosis Carotid duplex today reveals minimal carotid artery stenosis bilaterally with a mild amount of plaque and relatively normal  velocities consistent with low and 1 to 39% ICA stenosis bilaterally.  This can be checked every few years.  Hyperlipidemia lipid control important in reducing the progression of atherosclerotic disease. Continue statin therapy     Selinda Gu, MD  01/05/2024 12:32 PM    This note was created with Dragon medical transcription system.  Any errors from dictation are purely unintentional

## 2024-01-05 NOTE — Assessment & Plan Note (Signed)
 lipid control important in reducing the progression of atherosclerotic disease. Continue statin therapy

## 2024-01-05 NOTE — Assessment & Plan Note (Signed)
 Carotid duplex today reveals minimal carotid artery stenosis bilaterally with a mild amount of plaque and relatively normal velocities consistent with low and 1 to 39% ICA stenosis bilaterally.  This can be checked every few years.

## 2024-01-05 NOTE — Assessment & Plan Note (Signed)
 Duplex today showed normal velocities in the celiac and superior mesenteric artery.  Doing well status post SMA intervention with decreased abdominal pain and markedly improved food tolerance.  Now gaining weight.  Up 5 pounds from last visit.  She will continue Plavix  and a statin agent.  We are beyond 90 days from her intervention, so I am okay stopping the aspirin  at her request.  Follow-up in 6 months with carotid duplex.

## 2024-01-05 NOTE — Assessment & Plan Note (Signed)
 Duplex also found the renal artery to have normal flow despite the fibromuscular dysplasia changes seen on angiogram.  No hemodynamically significant stenosis was identified in either renal artery and the kidney lengths were equal and normal.  Recheck with duplex at her next visit in 6 months.

## 2024-01-07 ENCOUNTER — Ambulatory Visit: Payer: Self-pay | Admitting: Internal Medicine

## 2024-01-07 ENCOUNTER — Ambulatory Visit (HOSPITAL_COMMUNITY)

## 2024-01-08 ENCOUNTER — Other Ambulatory Visit

## 2024-01-08 ENCOUNTER — Encounter: Payer: Self-pay | Admitting: Internal Medicine

## 2024-01-08 ENCOUNTER — Ambulatory Visit (INDEPENDENT_AMBULATORY_CARE_PROVIDER_SITE_OTHER): Admitting: Internal Medicine

## 2024-01-08 VITALS — BP 130/80 | HR 87 | Ht 67.0 in | Wt 121.0 lb

## 2024-01-08 DIAGNOSIS — M810 Age-related osteoporosis without current pathological fracture: Secondary | ICD-10-CM | POA: Diagnosis not present

## 2024-01-08 DIAGNOSIS — E559 Vitamin D deficiency, unspecified: Secondary | ICD-10-CM | POA: Diagnosis not present

## 2024-01-08 NOTE — Patient Instructions (Addendum)
 Please continue:  - vitamin D  5000 units every other day - BoneUp supplement  - Prolia  every 6 months  Bring the calcium  and vitamin D  supplements at every visit.  Please stop by the lab.  Please come back for a follow-up appointment in 1 year.

## 2024-01-08 NOTE — Progress Notes (Addendum)
 Patient ID: Dickie Candance Lobstein, female   DOB: Jul 07, 1945, 78 y.o.   MRN: [id number]   HPI  Hania Jan Walters is a 78 y.o.-year-old female, initially referred by her PCP, Dr. Geofm, for follow-up for osteoporosis (OP).  I previously saw the patient in 03/2018, but she reestablished care in 02/2023.  Our last visit was 7 months ago. She is here accompanied by her husband who offers part of the history especially related to fracture history, activity, and symptoms.  Interim history: No falls or fractures since last visit.   She continues to have dizziness/spinning sensation.  She also has hypotension and is on midodrine . 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  Reviewed and addended history: Pt was dx with OP in 12/2017.  I reviewed pt's DXA scan reports:   L1-L3 (L4) T score FN T score 33% distal Radius  10/28/2023 ([facility]) -2.0 (L1-L4) RFN: -2.1 LFN: n/a -2.5  10/07/2021 ([facility]) -2.4 RFN: -2.0 LFN: n/a -2.3  12/16/2017 ([facility]) -2.5 (-5.2%*) RFN: -1.9 LFN: -2.2 n/a  05/17/2015 ([facility]) -2.2  RFN: -1.9 LFN: -2.1 n/a   She does have neuropathy and back pain.  Previous fractures: Before last visit, she had a left intertrochanteric femoral fracture 08/2018 after a fall going down steps, for which she had to have ORIF.  She sees Beverley Millman.  She also had a wrist fracture in 05/2021 - fell on uneven ground.    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  It is unclear whether this  could be related to her Fosamax use in the past or not.  She does have a h/o vitamin D  deficiency. Reviewed available vit D levels: Lab Results  Component Value Date   VD25OH 60.69 06/03/2023   VD25OH 40 02/03/2023   VD25OH 64.14 01/21/2022   VD25OH 38.55 06/25/2021   VD25OH 46.43 10/15/2020   VD25OH 62.16 04/27/2020   VD25OH 62.87 12/26/2019    VD25OH 69.69 07/21/2019   VD25OH 29.77 (L) 12/22/2018   VD25OH 30.94 12/18/2017   Pt is on: - calcium  1000 mg daily  >> 500 mg -but she is unsure what dose she is taking at today's visit (she did not bring her supplements with her) - vitamin D   - Bone Up supplement (+ vitamin K2)-started 01/2018 -but she is unsure what dose she is taking at today's visit (she did not bring her supplements with her) - Bone UP: 500 units + 5000 units daily  >> decreased only 5000 units daily -but she is unsure what dose she is taking at today's visit (she did not bring her supplements with her) I advised her to stop magnesium  06/2023.  No weight bearing exercises.   She has DDD, and also trochanteric bursitis.  She had steroid inj's in hip and back.  She also has orthostatic hypotension (dysautonomia) and is on midodrine .  She has a history of syncopal episodes, but not recently.  She does not take high vitamin A doses.  Menopause was in her late 30s and she was on HRT for years.  FH of osteoporosis: Mother, aunt.  No h/o persistent hyper/hypocalcemia or hyperparathyroidism. No h/o kidney stones. Lab Results  Component Value Date   CALCIUM  9.9 11/20/2023   CALCIUM  8.9 09/23/2023   CALCIUM  8.8 (L) 09/22/2023   CALCIUM  8.7 09/21/2023   CALCIUM  9.6 06/03/2023   CALCIUM  9.9 02/06/2023   CALCIUM  9.4 12/24/2022  CALCIUM  9.6 11/20/2022   CALCIUM  8.0 (L) 11/13/2022   CALCIUM  9.2 08/13/2022   No h/o thyrotoxicosis. Reviewed TSH recent levels:  Lab Results  Component Value Date   TSH 3.82 06/03/2023   TSH 2.69 06/12/2022   TSH 3.76 01/21/2022   TSH 2.88 10/09/2021   TSH 3.13 12/26/2019   No h/o CKD. Last BUN/Cr: Lab Results  Component Value Date   BUN 21 11/20/2023   CREATININE 0.57 11/20/2023   She also has a history of IBS with constipation, GERD-on Protonix  occs., hyperlipidemia, prediabetes. She previously lost approximately 15 pounds due to anxiety and depression.  ROS: + see HPI  I  reviewed pt's medications, allergies, PMH, social hx, family hx, and changes were documented in the history of present illness. Otherwise, unchanged from my initial visit note.  Past Medical History:  Diagnosis Date   Allergy     Anemia    Anxiety    Asthma    Cataract    bil cateracts removed   Complication of anesthesia    Depression    generalized anxiety disorder   Diverticulosis    Duodenal diverticulum    Family history of adverse reaction to anesthesia    Mother and Daughters- N/V   Fibromyalgia    Gastroesophageal reflux disease with hiatal hernia    Hiatal hernia    History of kidney stones    Hyperlipidemia    IBS (irritable bowel syndrome)    Lymphocytic colitis    Dr Jakie   Migraine headache    Mitral valve prolapse    Neuropathy    Orthostatic hypotension    Osteopenia    BMD ordered by GYN   Osteoporosis    Peripheral neuropathy    treated as RLS by  Neurology   PONV (postoperative nausea and vomiting)    Restless leg syndrome    Schatzki's ring    History of   Sleep apnea    On CPAP, has not been using   Spondylosis    Past Surgical History:  Procedure Laterality Date   ANAL RECTAL MANOMETRY N/A 11/14/2015   Procedure: ANO RECTAL MANOMETRY;  Surgeon: Lupita FORBES Commander, MD;  Location: [facility];  Service: Endoscopy;  Laterality: N/A;   BREAST ENHANCEMENT SURGERY     CATARACT EXTRACTION Bilateral    cataract surgery Left    Dr Camillo   CHOLECYSTECTOMY     COLONOSCOPY     ESOPHAGEAL DILATION     X 2   RENAL INTERVENTION N/A 10/07/2023   Procedure: RENAL INTERVENTION;  Surgeon: Marea Selinda RAMAN, MD;  Location: [facility];  Service: Cardiovascular;  Laterality: N/A;   ROTATOR CUFF REPAIR Left    SINUS ENDO W/FUSION N/A 05/30/2014   Procedure: REVISION  FRONTAL SINUS SURGERY WITH FUSION SCAN;  Surgeon: Alm Bouche, MD;  Location: [facility];  Service: ENT;  Laterality: N/A;   SINUS SURGERY WITH INSTATRAK     x 2   TUBAL LIGATION     UPPER  GASTROINTESTINAL ENDOSCOPY     Vaginal cystectomy     x 2   VAGINAL HYSTERECTOMY  05/2007   Vaginal repair, Dr Cary.  Partial  hysterectomy.   VISCERAL ANGIOGRAPHY N/A 10/07/2023   Procedure: VISCERAL ANGIOGRAPHY;  Surgeon: Marea Selinda RAMAN, MD;  Location: [facility];  Service: Cardiovascular;  Laterality: N/A;   WRIST ARTHROSCOPY     Social History   Socioeconomic History   Marital status: Married    Spouse name: Not on  file   Number of children: 2   Years of education: 53   Highest education level: Not on file  Occupational History   Occupation: retired    Associate Professor: [employer]  Tobacco Use   Smoking status: Former    Current packs/day: 0.00    Average packs/day: 0.5 packs/day for 15.0 years (7.5 ttl pk-yrs)    Types: Cigarettes    Start date: 03/04/1983    Quit date: 03/03/1998    Years since quitting: 25.8    Passive exposure: Past   Smokeless tobacco: Never   Tobacco comments:    smoked 1973- 2006, up to <  1  ppd  Vaping Use   Vaping status: Never Used  Substance and Sexual Activity   Alcohol use: No    Alcohol/week: 0.0 standard drinks of alcohol   Drug use: No   Sexual activity: Yes    Partners: Male    Comment: 1st intercourse- 15, partners- 2, married- 22 yrs   Other Topics Concern   Not on file  Social History Narrative   HAS REGULAR EXERCISE   DAILY CAFFEINE: 1-2 CUPS   Patient is right handed.   Social Drivers of Corporate Investment Banker Strain: Not on file  Food Insecurity: Low Risk  (06/08/2023)   Received from [facility]   Hunger Vital Sign    Within the past 12 months, you worried that your food would run out before you got money to buy more: Never true    Within the past 12 months, the food you bought just didn't last and you didn't have money to get more. : Never true  Transportation Needs: No Transportation Needs (06/08/2023)   Received from [facility]    In the past 12 months, has lack of reliable transportation kept  you from medical appointments, meetings, work or from getting things needed for daily living? : No  Physical Activity: Not on file  Stress: Not on file  Social Connections: Not on file  Intimate Partner Violence: Not on file   Current Outpatient Medications on File Prior to Visit  Medication Sig Dispense Refill   aspirin  EC 81 MG tablet Take 1 tablet (81 mg total) by mouth daily. Swallow whole. 150 tablet 2   Azelastine -Fluticasone  137-50 MCG/ACT SUSP PLACE 1 SPRAY INTO THE NOSE EVERY 12 (TWELVE) HOURS. 23 g 8   budeson-glycopyrrolate -formoterol  (BREZTRI  AEROSPHERE) 160-9-4.8 MCG/ACT AERO Inhale 2 puffs into the lungs 2 (two) times daily. 10.7 g 11   budesonide  (ENTOCORT EC ) 3 MG 24 hr capsule Take 2 capsules daily and then on October 22 reduce to 1 capsule daily     clopidogrel  (PLAVIX ) 75 MG tablet Take 1 tablet (75 mg total) by mouth daily. 30 tablet 11   Cyanocobalamin  (VITAMIN B-12 PO) Take 1 tablet by mouth daily.     diphenoxylate -atropine  (LOMOTIL ) 2.5-0.025 MG tablet Take 1 tablet every 6 hours as needed for diarrhea lasting 2 days or more     famotidine  (PEPCID ) 40 MG tablet Take 1 tablet (40 mg total) by mouth 2 (two) times daily as needed for heartburn or indigestion. 180 tablet 1   fluticasone -salmeterol (WIXELA INHUB) 100-50 MCG/ACT AEPB Inhale 1 puff into the lungs 2 (two) times daily.     hydrOXYzine (ATARAX) 25 MG tablet Take 25 mg by mouth daily as needed.     levalbuterol  (XOPENEX  HFA) 45 MCG/ACT inhaler Inhale 1-2 puffs into the lungs every 4 (four) hours as needed for wheezing.  1 each 12   midodrine  (PROAMATINE ) 10 MG tablet Take 1 tablet (10 mg total) by mouth 2 (two) times daily as needed (for SBP 100 or less).     ondansetron  (ZOFRAN -ODT) 4 MG disintegrating tablet Take 1 tablet (4 mg total) by mouth every 8 (eight) hours as needed for nausea or vomiting. 20 tablet 0   OVER THE COUNTER MEDICATION Bone Up 1000 mg 2 tabs TID     OVER THE COUNTER MEDICATION B-Right  (B-Complex) 1 po daily     pantoprazole  (PROTONIX ) 40 MG tablet Take 40 mg by mouth daily.     PARoxetine (PAXIL) 20 MG tablet Take 20 mg by mouth at bedtime.     potassium chloride  (KLOR-CON ) 20 MEQ packet DISSOLVE CONTENTS OF 1 PACKET DAILY 90 packet 1   Probiotic Product (ALIGN) 4 MG CAPS Take 1 capsule by mouth as needed.     QUEtiapine  (SEROQUEL ) 25 MG tablet      RESTASIS 0.05 % ophthalmic emulsion 1 drop 2 (two) times daily.     rosuvastatin  (CRESTOR ) 5 MG tablet TAKE 1 TABLET (5 MG TOTAL) BY MOUTH DAILY FOR CHOLESTEROL 90 tablet 3   traZODone  (DESYREL ) 100 MG tablet Take 100 mg by mouth at bedtime.     traZODone  (DESYREL ) 50 MG tablet Take 1 tablet (50 mg total) by mouth at bedtime. 30 tablet 0   vitamin C (ASCORBIC ACID) 500 MG tablet Take 500 mg by mouth daily.     vitamin E 180 MG (400 UNITS) capsule Take 400 Units by mouth daily.     [DISCONTINUED] Hyoscyamine -Phenyltoloxamine (DIGEX NF) 0.0625-15 MG CAPS Take one tablet by mouth as needed for abdominal cramping 24 each 0   No current facility-administered medications on file prior to visit.   Allergies  Allergen Reactions   Tape Rash   Albuterol      heartburn   Cymbalta  [Duloxetine  Hcl]     Diarrhea, nausea, anxiety worse, shaky   Latex Other (See Comments)   Lyrica  [Pregabalin ] Other (See Comments)    Sedation   Neurontin [Gabapentin] Other (See Comments)    Sedation   Nitrofuran Derivatives Other (See Comments)    tingling   Paxil [Paroxetine Hcl]     Bowel upset, tingling   Prozac  [Fluoxetine  Hcl] Other (See Comments)    Made patient worse and constipation   Family History  Problem Relation Age of Onset   Hypertension Father 41   Stroke Father 47   Heart attack Father         ? in 61s   Hypertension Mother    Neuropathy Mother    Anemia Mother    Coronary artery disease Brother        Stent placement in 27s   Diabetes Maternal Uncle    Heart attack Paternal Uncle        SEVEN , ? age   Colon cancer  Neg Hx    Seizures Neg Hx    Esophageal cancer Neg Hx    Rectal cancer Neg Hx    Stomach cancer Neg Hx    PE: BP 130/80   Pulse 87   Ht 5' 7 (1.702 m)   Wt 121 lb (54.9 kg)   SpO2 98%   BMI 18.95 kg/m  Wt Readings from Last 3 Encounters:  01/08/24 121 lb (54.9 kg)  01/05/24 122 lb (55.3 kg)  12/23/23 118 lb (53.5 kg)   Constitutional: normal weight, in NAD, + kyphosis Eyes:  EOMI, no exophthalmos ENT:  no neck masses, no cervical lymphadenopathy Cardiovascular: RRR, No MRG Respiratory: CTA B Musculoskeletal: no deformities other than kyphosis Skin:no rashes Neurological: + tremor with outstretched hands  Assessment: 1. Osteoporosis  2.  History of vitamin D  deficiency  Plan: 1. Osteoporosis - Likely postmenopausal/age-related and she also has a family history of osteoporosis -She has a history of 2 fractures in the interval before our visit from 02/2023 (at that time she returned after a long absence of approximately 5 years): Hip and a wrist fracture.  These were complicated and she required surgery with intramedullary rods and screws.  No fractures since. - We discussed that her fracture risk is increased due to previous fractures and history of falls. -At last visit, we reviewed her available bone density scans and I explained that the T-scores were not entirely comparable for the 2023 and 2019 scans, since these were checked on different machines.  However, they were borderline between osteopenia and osteoporosis.  We discussed that the risk of fracture did not have a clear threshold and especially in the setting of her hip or wrist fracture, she was at an increased risk for fractures.  She had another bone density scan since last visit which appeared to be improved at the spine, stable at the hip and likely lower at the forearm (10/28/2023). - we started Prolia  02/2023 and she had another injection 09/01/2023.  She tolerates this well without thigh/hip pain. Of note, she has  a distant history of Fosamax use which could have caused jaw degeneration and we should be careful with Prolia  but this is not completely contraindicated.  No complaints of jaw and dental issues now. - We discussed about continuing with Prolia  for now.  She will change her insurance towards the end of the year she is wondering whether the new insurance is covering Prolia .  I did advise her that we may need to use a biosimilar instead. - At last visit she was not exercising as she had more fatigue but we discussed about weightbearing exercises 5 out of 7 days and given recommended exercise handout. She is not exercising consistently now and we discussed about different exercises to help help especially with forearm BMD - I previously recommended the [facility] for skeletal loading but she did not try this.  I recommended physical therapy at [facility] and she was able to do this. - She is not drinking more than 2 alcoholic drinks a day or smoking.  We discussed about maintaining a good amount of protein in the diet, at least 50 g a day for her - Plan to repeat another bone density scan in 2 years from the previous - Latest calcium  level was normal in 11/2023.  We will not repeat this today. - will see pt back in 1 year  2.  History of vitamin D  deficiency -At last visit, she was taking 5500 units of vitamin D , increased from 1800 units at the previous visit.  I advised her to decrease the dose to only 5000 units every other day.  She is not sure if she is taking this dose now.  I advised her to look at home and let me know. - Latest vitamin D  level was at goal: Lab Results  Component Value Date   VD25OH 60.69 06/03/2023  - Will continue to keep an eye on her vitamin D  level. - Plan to repeat the level now  Component     Latest Ref Rng 01/08/2024  Vitamin D , 25-Hydroxy  30 - 100 ng/mL 39   Normal.  Lela Fendt, MD PhD [facility] Endocrinology

## 2024-01-09 LAB — VITAMIN D 25 HYDROXY (VIT D DEFICIENCY, FRACTURES): Vit D, 25-Hydroxy: 39 ng/mL (ref 30–100)

## 2024-01-11 ENCOUNTER — Ambulatory Visit: Payer: Self-pay | Admitting: Internal Medicine

## 2024-01-15 ENCOUNTER — Encounter: Payer: Self-pay | Admitting: Internal Medicine

## 2024-01-15 ENCOUNTER — Ambulatory Visit: Admitting: Internal Medicine

## 2024-01-15 VITALS — BP 102/62 | HR 60 | Ht 67.0 in | Wt 123.0 lb

## 2024-01-15 DIAGNOSIS — K5902 Outlet dysfunction constipation: Secondary | ICD-10-CM

## 2024-01-15 DIAGNOSIS — K52832 Lymphocytic colitis: Secondary | ICD-10-CM | POA: Diagnosis not present

## 2024-01-15 DIAGNOSIS — K582 Mixed irritable bowel syndrome: Secondary | ICD-10-CM

## 2024-01-15 DIAGNOSIS — G901 Familial dysautonomia [Riley-Day]: Secondary | ICD-10-CM | POA: Diagnosis not present

## 2024-01-15 DIAGNOSIS — R634 Abnormal weight loss: Secondary | ICD-10-CM

## 2024-01-15 NOTE — Progress Notes (Signed)
 [name] 77 y.o. [birth year] [id number]  Assessment & Plan:   Encounter Diagnoses  Name Primary?   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 Currently experiencing constipation with hard, large stools. No current diarrhea, so budesonide  is not needed. - Consume Greek yogurt daily with fresh berries. - Eat four prunes daily. - Add one tablespoon of Benefiber daily. - Ensure adequate fluid intake with six to eight glasses of water daily. - Keep diphenoxylate  and atropine  on hand for diarrhea management.  Hypotension/dysautonomia Managed with midodrine , taken when blood pressure is 100 or less. - Continue midodrine  as needed for hypotension.  Abnormal weight loss, now improving Weight improved from 115 lbs in October to 120.8 lbs currently. Weight gain attributed to dietary changes and protein supplementation. - Continue current dietary regimen with protein supplementation as needed.  Lymphocytic colitis Colitis currently inactive with no diarrhea. Budesonide  discontinued - Contact provider if symptoms of colitis recur.       I do think pelvic floor physical therapy could be helpful but I do not think she is in a place where she would or could do it and insight is an issue.  We have referred in the past that she has not followed through. 03/16/2024 follow-up with me     Subjective:  Gastroenterology summary:   IBS/diarrhea Decades of diarrhea greater than constipation predominant IBS (mixed pattern).  Colon biopsies have shown lymphocytic colitis.  Tissue transglutaminase serology negative. Dyssynergia defecation and weak internal anal sphincter on manometry 2017   Colonoscopy October 2021 normal exam to TI  except for 3 diminutive polyps and sigmoid diverticulosis.   Polyps were adenomas, random colon biopsy showed lymphocytic colitis terminal ileal biopsies were normal   GERD with dysphagia Chief Complaint:  HPI Discussed the use of AI scribe software for clinical note transcription with the patient, who gave verbal consent to proceed.     [name] [name] is a 78 year old female with colitis who presents with constipation and bowel movement irregularities. She is accompanied by [name], her husband and caregiver  Bowel movement irregularities - Constipation characterized by passage of 'little, small, hard balls' requiring significant effort to expel plus some very large stools at times.  She brought with her a diary as I asked.  She really has not had any diarrhea in about 3 to 4 weeks.  It was episodic with a few episodes in October since last visit of October 7.  Has finished budesonide . - Occasional large, hard stools that are difficult to pass husband has photos of different size stools different colored stools but all within the range of normal - Intermittent episodes of diarrhea alternating with constipation - Diarrhea occurs after consuming specific foods such as cheese steak and chicken pot pie - Maintains a diary of bowel habits  Colitis management - Recently completed a course of budesonide  for colitis, previously used for episodes of diarrhea - Avoids diphenoxylate  and atropine  during periods of constipation  Nutritional status and weight changes - Consumes Greek yogurt and blueberries regularly - Drinks six to eight glasses of water daily - Uses Ensure for supplemental nutrition due to prior weight loss - Recent weight increase from 115 to 123 pounds  Blood pressure abnormalities - History of low blood  pressure - Uses midodrine  as needed when blood pressure is 100 mmHg or less - Monitors blood pressure to determine need for medication  Osteoporosis and  neuropathy - History of osteoporosis - History of neuropathy affecting feet and legs - Switched to almond milk for increased calcium  intake to manage osteoporosis       Wt Readings from Last 3 Encounters:  01/15/24 123 lb (55.8 kg)  01/08/24 121 lb (54.9 kg)  01/05/24 122 lb (55.3 kg)     Allergies  Allergen Reactions   Tape Rash   Albuterol      heartburn   Cymbalta  [Duloxetine  Hcl]     Diarrhea, nausea, anxiety worse, shaky   Latex Other (See Comments)   Lyrica  [Pregabalin ] Other (See Comments)    Sedation   Neurontin [Gabapentin] Other (See Comments)    Sedation   Nitrofuran Derivatives Other (See Comments)    tingling   Paxil [Paroxetine Hcl]     Bowel upset, tingling   Prozac  [Fluoxetine  Hcl] Other (See Comments)    Made patient worse and constipation   Current Meds  Medication Sig   aspirin  EC 81 MG tablet Take 1 tablet (81 mg total) by mouth daily. Swallow whole.   Azelastine -Fluticasone  137-50 MCG/ACT SUSP PLACE 1 SPRAY INTO THE NOSE EVERY 12 (TWELVE) HOURS.   budeson-glycopyrrolate -formoterol  (BREZTRI  AEROSPHERE) 160-9-4.8 MCG/ACT AERO Inhale 2 puffs into the lungs 2 (two) times daily.   clopidogrel  (PLAVIX ) 75 MG tablet Take 1 tablet (75 mg total) by mouth daily.   Cyanocobalamin  (VITAMIN B-12 PO) Take 1 tablet by mouth daily.   famotidine  (PEPCID ) 40 MG tablet Take 1 tablet (40 mg total) by mouth 2 (two) times daily as needed for heartburn or indigestion.   fluticasone -salmeterol (WIXELA INHUB) 100-50 MCG/ACT AEPB Inhale 1 puff into the lungs 2 (two) times daily.   hydrOXYzine (ATARAX) 25 MG tablet Take 25 mg by mouth daily as needed.   levalbuterol  (XOPENEX  HFA) 45 MCG/ACT inhaler Inhale 1-2 puffs into the lungs every 4 (four) hours as needed for wheezing.   midodrine  (PROAMATINE ) 10 MG tablet Take 1 tablet (10 mg total) by mouth 2 (two) times daily as needed (for SBP 100 or less).   ondansetron  (ZOFRAN -ODT) 4 MG disintegrating tablet Take 1 tablet (4 mg  total) by mouth every 8 (eight) hours as needed for nausea or vomiting.   OVER THE COUNTER MEDICATION Bone Up 1000 mg 2 tabs TID   OVER THE COUNTER MEDICATION B-Right (B-Complex) 1 po daily   pantoprazole  (PROTONIX ) 40 MG tablet Take 40 mg by mouth daily.   PARoxetine (PAXIL) 20 MG tablet Take 20 mg by mouth at bedtime.   potassium chloride  (KLOR-CON ) 20 MEQ packet DISSOLVE CONTENTS OF 1 PACKET DAILY   Probiotic Product (ALIGN) 4 MG CAPS Take 1 capsule by mouth as needed.   QUEtiapine  (SEROQUEL ) 25 MG tablet    RESTASIS 0.05 % ophthalmic emulsion 1 drop 2 (two) times daily.   rosuvastatin  (CRESTOR ) 5 MG tablet TAKE 1 TABLET (5 MG TOTAL) BY MOUTH DAILY FOR CHOLESTEROL   traZODone  (DESYREL ) 100 MG tablet Take 100 mg by mouth at bedtime.   traZODone  (DESYREL ) 50 MG tablet Take 1 tablet (50 mg total) by mouth at bedtime.   vitamin C (ASCORBIC ACID) 500 MG tablet Take 500 mg by mouth daily.   vitamin E 180 MG (400 UNITS) capsule Take 400 Units by mouth daily.   Past Medical History:  Diagnosis Date   Allergy     Anemia  Anxiety    Asthma    Cataract    bil cateracts removed   Complication of anesthesia    Depression    generalized anxiety disorder   Diverticulosis    Duodenal diverticulum    Family history of adverse reaction to anesthesia    Mother and Daughters- N/V   Fibromyalgia    Gastroesophageal reflux disease with hiatal hernia    Hiatal hernia    History of kidney stones    Hyperlipidemia    IBS (irritable bowel syndrome)    Lymphocytic colitis    Dr Jakie   Migraine headache    Mitral valve prolapse    Neuropathy    Orthostatic hypotension    Osteopenia    BMD ordered by GYN   Osteoporosis    Peripheral neuropathy    treated as RLS by  Neurology   PONV (postoperative nausea and vomiting)    Restless leg syndrome    Schatzki's ring    History of   Sleep apnea    On CPAP, has not been using   Spondylosis    Past Surgical History:  Procedure Laterality  Date   ANAL RECTAL MANOMETRY N/A 11/14/2015   Procedure: ANO RECTAL MANOMETRY;  Surgeon: Lupita FORBES Commander, MD;  Location: [facility];  Service: Endoscopy;  Laterality: N/A;   BREAST ENHANCEMENT SURGERY     CATARACT EXTRACTION Bilateral    cataract surgery Left    Dr Camillo   CHOLECYSTECTOMY     COLONOSCOPY     ESOPHAGEAL DILATION     X 2   RENAL INTERVENTION N/A 10/07/2023   Procedure: RENAL INTERVENTION;  Surgeon: Marea Selinda RAMAN, MD;  Location: [facility];  Service: Cardiovascular;  Laterality: N/A;   ROTATOR CUFF REPAIR Left    SINUS ENDO W/FUSION N/A 05/30/2014   Procedure: REVISION  FRONTAL SINUS SURGERY WITH FUSION SCAN;  Surgeon: Alm Bouche, MD;  Location: [facility];  Service: ENT;  Laterality: N/A;   SINUS SURGERY WITH INSTATRAK     x 2   TUBAL LIGATION     UPPER GASTROINTESTINAL ENDOSCOPY     Vaginal cystectomy     x 2   VAGINAL HYSTERECTOMY  05/2007   Vaginal repair, Dr Cary.  Partial  hysterectomy.   VISCERAL ANGIOGRAPHY N/A 10/07/2023   Procedure: VISCERAL ANGIOGRAPHY;  Surgeon: Marea Selinda RAMAN, MD;  Location: [facility];  Service: Cardiovascular;  Laterality: N/A;   WRIST ARTHROSCOPY     Social History   Social History Narrative   HAS REGULAR EXERCISE   DAILY CAFFEINE: 1-2 CUPS   Patient is right handed.   family history includes Anemia in her mother; Coronary artery disease in her brother; Diabetes in her maternal uncle; Heart attack in her father and paternal uncle; Hypertension in her mother; Hypertension (age of onset: 15) in her father; Neuropathy in her mother; Stroke (age of onset: 42) in her father.   Review of Systems As above has a headache today  Objective:   Physical Exam BP 102/62   Pulse 60   Ht 5' 7 (1.702 m)   Wt 123 lb (55.8 kg)   BMI 19.26 kg/m  No acute distress

## 2024-01-15 NOTE — Patient Instructions (Addendum)
  VISIT SUMMARY: Today, we discussed your ongoing issues with constipation and bowel movement irregularities, as well as your management of colitis, blood pressure, and weight. We also reviewed your nutritional status and osteoporosis management.  YOUR PLAN: CONSTIPATION AND INTERMITTENT DIARRHEA: You have been experiencing alternating constipation and diarrhea since October, with current symptoms of constipation and no diarrhea. -Consume [place] Greek yogurt daily with fresh berries. -Eat four prunes daily. -Add one tablespoon of Benefiber daily. -Ensure adequate fluid intake with six to eight glasses of water other fluids daily. -Keep diphenoxylate  and atropine  on hand for diarrhea management. -If you get persistent diarrhea call me so I can advise treatment  HYPOTENSION: You have a history of low blood pressure, managed with midodrine  as needed. -Continue taking midodrine  as needed when your blood pressure is 100 mmHg or less.  ABNORMAL WEIGHT LOSS, NOW IMPROVING: Your weight has improved from 115 lbs in October to 123 lbs currently, thanks to dietary changes and protein supplementation. -Continue your current dietary regimen with protein drinks (Ensure)  INACTIVE COLITIS: Your colitis is currently inactive with no diarrhea, so budesonide  has been discontinued. -Contact your provider if symptoms of colitis recur.   I appreciate the opportunity to care for you. Heather Commander, MD, [facility]                     Contains text generated by Abridge.                                 Contains text generated by Abridge.

## 2024-01-20 ENCOUNTER — Encounter: Payer: Self-pay | Admitting: Surgery

## 2024-01-20 ENCOUNTER — Telehealth: Payer: Self-pay | Admitting: Surgery

## 2024-01-20 ENCOUNTER — Ambulatory Visit (INDEPENDENT_AMBULATORY_CARE_PROVIDER_SITE_OTHER): Admitting: Surgery

## 2024-01-20 DIAGNOSIS — K4090 Unilateral inguinal hernia, without obstruction or gangrene, not specified as recurrent: Secondary | ICD-10-CM

## 2024-01-20 NOTE — Patient Instructions (Signed)
 You have chose to have your hernia repaired. This will be done by Dr. Aleen Campi at [facility].  If you are on any injectable weight loss medication, you will need to stop taking your GLP-1 injectable (weight loss) medications 8 days before your surgery to avoid any complications with anesthesia.   Please see your (blue) Pre-care information that you have been given today. Our surgery scheduler will call you to verify surgery date and to go over information.   You will need to arrange to be out of work for approximately 1-2 weeks and then you may return with a lifting restriction for 4 more weeks. If you have FMLA or Disability paperwork that needs to be filled out, please have your company fax your paperwork to [phone] or you may drop this by either office. This paperwork will be filled out within 3 days after your surgery has been completed.  You may have a bruise in your groin and also swelling and brusing in your testicle area. You may use ice 4-5 times daily for 15-20 minutes each time. Make sure that you place a barrier between you and the ice pack. To decrease the swelling, you may roll up a bath towel and place it vertically in between your thighs with your testicles resting on the towel. You will want to keep this area elevated as much as possible for several days following surgery.    Inguinal Hernia, Adult Muscles help keep everything in the body in its proper place. But if a weak spot in the muscles develops, something can poke through. That is called a hernia. When this happens in the lower part of the belly (abdomen), it is called an inguinal hernia. (It takes its name from a part of the body in this region called the inguinal canal.) A weak spot in the wall of muscles lets some fat or part of the small intestine bulge through. An inguinal hernia can develop at any age. Men get them more often than women. CAUSES  In adults, an inguinal hernia develops over time. It can be  triggered by: Suddenly straining the muscles of the lower abdomen. Lifting heavy objects. Straining to have a bowel movement. Difficult bowel movements (constipation) can lead to this. Constant coughing. This may be caused by smoking or lung disease. Being overweight. Being pregnant. Working at a job that requires long periods of standing or heavy lifting. Having had an inguinal hernia before. One type can be an emergency situation. It is called a strangulated inguinal hernia. It develops if part of the small intestine slips through the weak spot and cannot get back into the abdomen. The blood supply can be cut off. If that happens, part of the intestine may die. This situation requires emergency surgery. SYMPTOMS  Often, a small inguinal hernia has no symptoms. It is found when a healthcare provider does a physical exam. Larger hernias usually have symptoms.  In adults, symptoms may include: A lump in the groin. This is easier to see when the person is standing. It might disappear when lying down. In men, a lump in the scrotum. Pain or burning in the groin. This occurs especially when lifting, straining or coughing. A dull ache or feeling of pressure in the groin. Signs of a strangulated hernia can include: A bulge in the groin that becomes very painful and tender to the touch. A bulge that turns red or purple. Fever, nausea and vomiting. Inability to have a bowel movement or to pass gas.  DIAGNOSIS  To decide if you have an inguinal hernia, a healthcare provider will probably do a physical examination. This will include asking questions about any symptoms you have noticed. The healthcare provider might feel the groin area and ask you to cough. If an inguinal hernia is felt, the healthcare provider may try to slide it back into the abdomen. Usually no other tests are needed. TREATMENT  Treatments can vary. The size of the hernia makes a difference. Options include: Watchful waiting. This  is often suggested if the hernia is small and you have had no symptoms. No medical procedure will be done unless symptoms develop. You will need to watch closely for symptoms. If any occur, contact your healthcare provider right away. Surgery. This is used if the hernia is larger or you have symptoms. Open surgery. This is usually an outpatient procedure (you will not stay overnight in a hospital). An cut (incision) is made through the skin in the groin. The hernia is put back inside the abdomen. The weak area in the muscles is then repaired by herniorrhaphy or hernioplasty. Herniorrhaphy: in this type of surgery, the weak muscles are sewn back together. Hernioplasty: a patch or mesh is used to close the weak area in the abdominal wall. Laparoscopy. In this procedure, a surgeon makes small incisions. A thin tube with a tiny video camera (called a laparoscope) is put into the abdomen. The surgeon repairs the hernia with mesh by looking with the video camera and using two long instruments. HOME CARE INSTRUCTIONS  After surgery to repair an inguinal hernia: You will need to take pain medicine prescribed by your healthcare provider. Follow all directions carefully. You will need to take care of the wound from the incision. Your activity will be restricted for awhile. This will probably include no heavy lifting for several weeks. You also should not do anything too active for a few weeks. When you can return to work will depend on the type of job that you have. During "watchful waiting" periods, you should: Maintain a healthy weight. Eat a diet high in fiber (fruits, vegetables and whole grains). Drink plenty of fluids to avoid constipation. This means drinking enough water and other liquids to keep your urine clear or pale yellow. Do not lift heavy objects. Do not stand for long periods of time. Quit smoking. This should keep you from developing a frequent cough. SEEK MEDICAL CARE IF:  A bulge  develops in your groin area. You feel pain, a burning sensation or pressure in the groin. This might be worse if you are lifting or straining. You develop a fever of more than 100.5 F (38.1 C). SEEK IMMEDIATE MEDICAL CARE IF:  Pain in the groin increases suddenly. A bulge in the groin gets bigger suddenly and does not go down. For men, there is sudden pain in the scrotum. Or, the size of the scrotum increases. A bulge in the groin area becomes red or purple and is painful to touch. You have nausea or vomiting that does not go away. You feel your heart beating much faster than normal. You cannot have a bowel movement or pass gas. You develop a fever of more than 102.0 F (38.9 C).   This information is not intended to replace advice given to you by your health care provider. Make sure you discuss any questions you have with your health care provider.   Document Released: 07/06/2008 Document Revised: 05/12/2011 Document Reviewed: 08/21/2014 Elsevier Interactive Patient Education [employer]
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
## 2024-01-20 NOTE — Telephone Encounter (Signed)
 Patient has been advised of Pre-Admission date/time, and Surgery date at [facility].  Surgery Date: 01/26/24 Preadmission Testing Date: 01/22/24 (phone 8a-1:00 pm)  Patient informed of the scheduling process and surgery information given at time of office visit.   Patient has been made aware to call [phone], between 1-3:00pm the day before surgery, to find out what time to arrive for surgery.
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
## 2024-01-22 ENCOUNTER — Other Ambulatory Visit: Payer: Self-pay

## 2024-01-22 ENCOUNTER — Telehealth: Payer: Self-pay

## 2024-01-22 ENCOUNTER — Encounter
Admission: RE | Admit: 2024-01-22 | Discharge: 2024-01-22 | Disposition: A | Discharge status: Home / Self Care | Source: Ambulatory Visit | Attending: Surgery | Admitting: Surgery

## 2024-01-22 ENCOUNTER — Encounter: Payer: Self-pay | Admitting: Surgery

## 2024-01-22 HISTORY — DX: Chronic vascular disorders of intestine: K55.1

## 2024-01-22 HISTORY — DX: Arterial fibromuscular dysplasia: I77.3

## 2024-01-22 HISTORY — DX: Atherosclerosis of renal artery: I70.1

## 2024-01-22 NOTE — Telephone Encounter (Signed)
   Name: [name] [facility]  DOB: 21-Feb-1946  MRN: [id number]  Primary Cardiologist: None   Preoperative team, please contact this patient and set up a phone call appointment ASAP as procedure is on 01/26/2024 for further preoperative risk assessment. Please obtain consent and complete medication review. Thank you for your help.  I confirm that guidance regarding antiplatelet and oral anticoagulation therapy has been completed and, if necessary, noted below.  CLOPIDOGREL  on hold since 11/19 per surgery direction,   ASA being continued throughout the perioperative course.   I also confirmed the patient resides in the state of [place] . As per [facility] Medical Board telemedicine laws, the patient must reside in the state in which the provider is licensed.   Lamarr Satterfield, NP 01/22/2024, 3:07 PM [facility]

## 2024-01-22 NOTE — Telephone Encounter (Signed)
 Pt returning call. Please advise.

## 2024-01-22 NOTE — Telephone Encounter (Signed)
  Patient Consent for Virtual Visit        Heather Diva Lemberger has provided verbal consent on 01/22/2024 for a virtual visit (video or telephone).   CONSENT FOR VIRTUAL VISIT FOR:  [name]  By participating in this virtual visit I agree to the following:  I hereby voluntarily request, consent and authorize [facility] and its employed or contracted physicians, physician assistants, nurse practitioners or other licensed health care professionals (the Practitioner), to provide me with telemedicine health care services (the "Services) as deemed necessary by the treating Practitioner. I acknowledge and consent to receive the Services by the Practitioner via telemedicine. I understand that the telemedicine visit will involve communicating with the Practitioner through live audiovisual communication technology and the disclosure of certain medical information by electronic transmission. I acknowledge that I have been given the opportunity to request an in-person assessment or other available alternative prior to the telemedicine visit and am voluntarily participating in the telemedicine visit.  I understand that I have the right to withhold or withdraw my consent to the use of telemedicine in the course of my care at any time, without affecting my right to future care or treatment, and that the Practitioner or I may terminate the telemedicine visit at any time. I understand that I have the right to inspect all information obtained and/or recorded in the course of the telemedicine visit and may receive copies of available information for a reasonable fee.  I understand that some of the potential risks of receiving the Services via telemedicine include:  Delay or interruption in medical evaluation due to technological equipment failure or disruption; Information transmitted may not be sufficient (e.g. poor resolution of images) to allow for appropriate medical  decision making by the Practitioner; and/or  In rare instances, security protocols could fail, causing a breach of personal health information.  Furthermore, I acknowledge that it is my responsibility to provide information about my medical history, conditions and care that is complete and accurate to the best of my ability. I acknowledge that Practitioner's advice, recommendations, and/or decision may be based on factors not within their control, such as incomplete or inaccurate data provided by me or distortions of diagnostic images or specimens that may result from electronic transmissions. I understand that the practice of medicine is not an exact science and that Practitioner makes no warranties or guarantees regarding treatment outcomes. I acknowledge that a copy of this consent can be made available to me via my patient portal [facility] MyChart), or I can request a printed copy by calling the office of  [facility].    I understand that my insurance will be billed for this visit.   I have read or had this consent read to me. I understand the contents of this consent, which adequately explains the benefits and risks of the Services being provided via telemedicine.  I have been provided ample opportunity to ask questions regarding this consent and the Services and have had my questions answered to my satisfaction. I give my informed consent for the services to be provided through the use of telemedicine in my medical care

## 2024-01-22 NOTE — Telephone Encounter (Signed)
 Preop tele appt now scheduled, med rec and consent done.

## 2024-01-22 NOTE — Telephone Encounter (Signed)
-----   Message from [name] sent at 01/22/2024 10:53 AM EST ----- Regarding: Request for pre-operative cardiac clearance Request for pre-operative cardiac clearance:  1. What type of surgery is being performed?  HERNIORRHAPHY, INGUINAL, ROBOT-ASSISTED, LAPAROSCOPIC  2. When is this surgery scheduled?  01/26/2024  3. Type of clearance being requested (medical, pharmacy, both)? BOTH   4. Are there any medications that need to be held prior to surgery? CLOPIDOGREL  on hold since 11/19 per surgery direction ASA being continued throughout the perioperative course.   5. Practice name and name of physician performing surgery?  Performing surgeon: Dr. Aloysius Plant, MD Requesting clearance: Heather Pereyra, FNP-C    6. Anesthesia type (none, local, MAC, general)? GENERAL  7. What is the office phone and fax number?   Phone: [phone] Fax: Return FAX not required. I will follow up in [facility].  ATTENTION: Unable to create telephone message as per your standard workflow. Directed by [facility] providers to send requests for cardiac clearance to this pool for appropriate distribution to provider covering pre-operative clearances.   Heather Pereyra, MSN, APRN, FNP-C, CEN [facility]  Peri-operative Services Nurse Practitioner Phone: [phone] 01/22/24 10:53 AM

## 2024-01-22 NOTE — Progress Notes (Signed)
 Perioperative / Anesthesia Services  Pre-Admission Testing Clinical Review / Pre-Operative Anesthesia Consult  Date: 01/22/24  PATIENT DEMOGRAPHICS: Name: [name] [facility] DOB: [birth year] MRN:   [id number]  Note: Available PAT nursing documentation and vital signs have been reviewed. Clinical nursing staff has updated patient's PMH/PSHx, current medication list, and drug allergies/intolerances to ensure complete and comprehensive history available to assist care teams in MDM as it pertains to the aforementioned surgical procedure and anticipated anesthetic course. Extensive review of available clinical information personally performed. Nursing documentation reviewed. [place] PMH and PSHx updated with any diagnoses and/or procedures that I have knowledge of that may have been inadvertently omitted during her intake with the pre-admission testing department's nursing staff.  PLANNED SURGICAL PROCEDURE(S):   Case: [id number] Date/Time: 01/26/24 1423   Procedure: HERNIORRHAPHY, INGUINAL, ROBOT-ASSISTED, LAPAROSCOPIC (Left)   Anesthesia type: General   Diagnosis: Left inguinal hernia [K40.90]   Pre-op diagnosis: Left inguinal hernia   Location: [facility] OR ROOM 04 / [facility] FOR ANEST[facility]   Surgeons: Desiderio Schanz, MD        CLINICAL DISCUSSION: [name] is a 78 y.o. female who is submitted for pre-surgical anesthesia review and clearance prior to her undergoing the above procedure. Patient is a Former Smoker (7.5 pack years; quit 03/1998). 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  Patient is followed by cardiology (Gollan, MD). She was last seen in the cardiology clinic on 05/19/2023; notes reviewed. At the time of her clinic visit, patient experiencing episodes of self-limiting atypical chest pain.  Symptoms have been persistent for several years.  She was also experiencing infrequent episodes of shortness of breath. Patient denied any PND, orthopnea, palpitations, significant peripheral edema, weakness, fatigue, vertiginous symptoms, or presyncope/syncope. Patient with a past medical history significant for cardiovascular diagnoses. Documented physical exam was grossly benign, providing no evidence of acute exacerbation and/or decompensation of the patient's known cardiovascular conditions.  Long-term cardiac event monitor study performed on 08/12/2017 revealed a predominant underlying sinus rhythm at an average rate of 71 bpm.  There were 122 episodes of PSVT/PAT with the fastest interval lasting 6 beats at a maximum rate of 207 and the longest lasting 15.4 seconds at an average rate of 129 bpm.  Atrial ectopy occasional accounting for 1.9% (73,230) of the total study burden.  Rare ventricular ectopy also noted.  There were no sustained arrhythmias or prolonged pauses.  No patient triggered events.  Most recent myocardial perfusion imaging study was performed on 06/08/2019 revealing a normal left ventricular systolic function with hyperdynamic LVEF of >65%.  There were no regional wall motion abnormalities.  No artifact or left ventricular cavity size enlargement appreciated on review of imaging. SPECT images demonstrated no evidence of stress-induced myocardial ischemia or arrhythmia; no scintigraphic evidence of scar.  TID ratio = 0.86 (normal range </= 1.2). Study determined to be normal and low risk.  Coronary CTA was performed on 10/26/2020 that demonstrated an Agatston coronary artery calcium   score of 52.5. This placed patient in the 48th percentile for age, sex, and race matched controls. Calcium  depositions noted to be isolated mainly in the proximal LAD distribution.  Study demonstrated normal coronary origin with RIGHT dominance.  Pulmonary artery and aorta noted to be of normal size/caliber with no evidence of ectasia.  Most recent TTE performed on 12/03/2021 revealed a normal left ventricular systolic function with an EF of 60-65%. There was no significant LVH.  There were no regional wall motion abnormalities. Left ventricular diastolic Doppler parameters consistent with abnormal relaxation (G1DD). Right ventricular size and function normal with a TAPSE measuring 1.6 cm  (normal range >/= 1.6 cm).  RVSP = 30.6 mmHg.  There was mild to moderate mitral and mild tricuspid valve regurgitation.  All transvalvular gradients were noted to be normal providing no evidence of hemodynamically significant valvular stenosis. Aorta normal in size with no evidence of ectasia or aneurysmal dilatation.  Given her PVD/PAD, patient is being treated with daily DAPT therapy using ASA + clopidogrel .  Patient reported be compliant with therapy with no evidence or reports of GI/GU related bleeding.  Blood pressure well controlled at 120/70 mmHg takes no oral antihypertensives.  Patient does have labile blood pressures requiring treatment with midodrine .  Patient is on rosuvastatin  for her HLD diagnosis and ASCVD prevention. Patient is not diabetic.  Patient does have an OSAH diagnosis, however due to issues with tolerability of the facemask, patient is noncompliant with her prescribed nocturnal PAP therapy.  Patient is able to complete all of her  ADL/IADLs without cardiovascular limitation.  Per the DASI, patient is able to achieve at least 4 METS of physical activity without experiencing any significant degree of angina/anginal equivalent symptoms. No changes were made to her medication regimen during her visit  with cardiology.  Patient scheduled to follow-up with outpatient cardiology in 12 months or sooner if needed.  [name] is scheduled for an elective HERNIORRHAPHY, INGUINAL, ROBOT-ASSISTED, LAPAROSCOPIC (Left) on 01/26/2024 with Dr. Aloysius Plant, MD. Given patient's past medical history significant for cardiovascular diagnoses, presurgical cardiac clearance was sought by the PAT team. Per cardiology, Ms. [name]'s perioperative risk of a major cardiac event is 0.9% according to the Revised Cardiac Risk Index (RCRI).  Therefore, she is at low risk for perioperative complications.   Her functional capacity is good at 5.07 METs according to the [place] Activity Status Index (DASI). According to ACC/AHA guidelines, no further cardiovascular testing needed.  The patient may proceed to surgery at ACCEPTABLE risk.  Again, this patient is on daily DAPT therapy.  She has been cleared by vascular surgery to hold her clopidogrel  for 5 days prior to her procedure with plans to restart as soon as postoperatively respectively minimized by her primary attending surgeon.  The patient is aware that her last dose of clopidogrel  should be on 01/20/2024. Given that patient's past medical history is significant for cardiovascular diagnoses, including but not limited to CAD, general surgery has cleared patient to continue her daily low dose ASA throughout her perioperative course. She will be asked to hold her normal dose on the day of her procedure only. Patient has been updated on these directives from her specialty care providers by the PAT team.  Patient reports previous perioperative complications with anesthesia in the past. Patient has a PMH (+) for PONV. Symptoms and history of PONV will be discussed with patient by anesthesia team on the day of her procedure. Interventions will be ordered as deemed necessary based on patient's individual care needs as determined by anesthesiologist.   Additionally, patient has a (+) familial history of PONV and a first-degree female relatives (daughters and mother). In review her EMR, it is noted that patient underwent a general  anesthetic course at [facility] (ASA II) in 05/2014 without documented complications.   MOST RECENT VITAL SIGNS:    01/22/2024    9:34 AM 01/15/2024    3:23 PM 01/08/2024   10:41 AM  Vitals with BMI  Height 5' 7.5 5' 7 5' 7  Weight 120 lbs 123 lbs 121 lbs  BMI 18.51 19.26 18.95  Systolic  102 130  Diastolic  62 80  Pulse  60 87   PROVIDERS/SPECIALISTS: NOTE: Primary physician provider listed below. Patient may have been seen by APP or partner within same practice.   PROVIDER ROLE / SPECIALTY LAST SHERLEAN Desiderio Schanz, MD General Surgery (Surgeon) 01/20/2024  Geofm Glade PARAS, MD Primary Care Provider 12/23/2023  Perla Lye, MD Cardiology 05/19/2023; preop APP call 01/25/2024  Marea Mayo, MD Vascular Surgery 01/05/2024   ALLERGIES: Allergies  Allergen Reactions   Tape Rash   Albuterol      heartburn   Latex Other (See Comments)   Lyrica  [Pregabalin ] Other (See Comments)    Sedation   Neurontin  [Gabapentin ] Other (See Comments)    Sedation   Nitrofuran Derivatives Other (See Comments)    tingling   Paxil [Paroxetine Hcl]     Bowel upset, tingling 01/22/24 pt taking generic   Prozac  [Fluoxetine  Hcl] Other (See Comments)    Made patient worse and constipation   Cymbalta  [Duloxetine  Hcl] Nausea Only and Anxiety    Diarrhea, nausea, anxiety worse, shaky    CURRENT HOME MEDICATIONS: No current facility-administered medications for this encounter.    Azelastine -Fluticasone  137-50 MCG/ACT SUSP   budeson-glycopyrrolate -formoterol  (BREZTRI  AEROSPHERE) 160-9-4.8 MCG/ACT AERO   clopidogrel  (PLAVIX ) 75 MG tablet   Cyanocobalamin  (VITAMIN B-12 PO)   famotidine  (PEPCID ) 40 MG tablet   hydrOXYzine (ATARAX) 25 MG tablet   levalbuterol  (XOPENEX  HFA) 45 MCG/ACT inhaler   midodrine   (PROAMATINE ) 10 MG tablet   ondansetron  (ZOFRAN -ODT) 4 MG disintegrating tablet   pantoprazole  (PROTONIX ) 40 MG tablet   PARoxetine (PAXIL) 20 MG tablet   Probiotic Product (ALIGN) 4 MG CAPS   RESTASIS 0.05 % ophthalmic emulsion   rosuvastatin  (CRESTOR ) 5 MG tablet   traZODone  (DESYREL ) 100 MG tablet   vitamin C (ASCORBIC ACID) 500 MG tablet   vitamin E 180 MG (400 UNITS) capsule   aspirin  EC 81 MG tablet   fluticasone -salmeterol (WIXELA INHUB) 100-50 MCG/ACT AEPB   OVER THE COUNTER MEDICATION   OVER THE COUNTER MEDICATION   potassium chloride  (KLOR-CON ) 20 MEQ packet   traZODone  (DESYREL ) 50 MG tablet   HISTORY: Past Medical History:  Diagnosis Date   Allergy     Anemia    Angina pectoris    Anxiety    Aortic atherosclerosis    Asthma    Bradycardia    CAD (coronary artery disease)    Chronic shortness of breath    Complication of anesthesia    a.) PONV   Depression    Diverticulosis    Duodenal diverticulum    Family history of adverse reaction to anesthesia    a.) PONV in first degree female relatives (mother and daughters)   Fibromyalgia    Gastroesophageal reflux disease with hiatal hernia    Hiatal hernia    History of bilateral cataract extraction    History of kidney stones    Hyperlipidemia    IBS (irritable bowel syndrome)    Left inguinal hernia    Long term current use of clopidogrel     Long-term use of aspirin  therapy    Lymphocytic colitis  Migraine headache    Mitral valve prolapse    Neuropathy    Orthostatic hypotension    OSA (obstructive sleep apnea)    a.) non-compliant with prescribed nocturnal PAP therapy; unable to tolerate mask per report   Osteoporosis    Palpitations    Peripheral neuropathy    PONV (postoperative nausea and vomiting)    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    RLS (restless legs syndrome)    Schatzki's ring    a.) s/p esophageal dilitation procedures   Spondylosis    Superior mesenteric artery  stenosis     Family History  Problem Relation Age of Onset   Hypertension Father 27   Stroke Father 96   Heart attack Father         ? in 53s   Hypertension Mother    Neuropathy Mother    Anemia Mother    Coronary artery disease Brother        Stent placement in 90s   Diabetes Maternal Uncle    Heart attack Paternal [employer] , ? age   Colon cancer Neg Hx    Seizures Neg Hx    Esophageal cancer Neg Hx    Rectal cancer Neg Hx    Stomach cancer Neg Hx    Social History   Tobacco Use   Smoking status: Former    Current packs/day: 0.00    Average packs/day: 0.5 packs/day for 15.0 years (7.5 ttl pk-yrs)    Types: Cigarettes    Start date: 03/04/1983    Quit date: 03/03/1998    Years since quitting: 25.9    Passive exposure: Past   Smokeless tobacco: Never   Tobacco comments:    smoked 1973- 2006, up to <  1  ppd  Substance Use Topics   Alcohol use: No    Alcohol/week: 0.0 standard drinks of alcohol   LABS:  Lab Results  Component Value Date   WBC 7.2 09/22/2023   HGB 11.8 (L) 09/22/2023   HCT 35.5 (L) 09/22/2023   MCV 90.8 09/22/2023   PLT 234 09/22/2023    Lab Results  Component Value Date   NA 140 11/20/2023   CL 101 11/20/2023   K 4.1 11/20/2023   CO2 31 11/20/2023   BUN 21 11/20/2023   CREATININE 0.57 11/20/2023   GFR 86.23 11/20/2023   CALCIUM  9.9 11/20/2023   ALBUMIN 4.6 11/20/2023   GLUCOSE 88 11/20/2023    ECG: Date: 09/22/2023  Time ECG obtained: 1455 PM Rate: 66 bpm Rhythm: Sinus rhythm with sinus arrhythmia Axis (leads I and aVF): indeterminate Intervals: PR 136 ms. QRS 72 ms. QTc 436 ms. ST segment and T wave changes: No evidence of acute T wave abnormalities or significant ST segment elevation or depression.  Evidence of a possible, age undetermined, prior infarct:  No Comparison: Similar to previous tracing obtained on 05/19/2023   IMAGING / PROCEDURES: VAS US  CAROTID performed on 01/05/2024 Velocities in the right ICA are  consistent with a 1-39% stenosis.  Velocities in the left ICA are consistent with a 1-39% stenosis.  Bilateral vertebral arteries demonstrate antegrade flow.  Normal flow hemodynamics were seen in bilateral subclavian arteries.   VAS US  RENAL ARTERY DUPLEX performed on 01/05/2024 Normal size right kidney. Normal right Resisitive Index. 1-59% stenosis of the right renal artery. RRV flow present.  Normal size of left kidney. Normal left Resistive Index. 1-59% stenosis of the left renal artery. LRV  flow present.  Normal Celiac artery and Superior Mesenteric artery findings.   TRANSTHORACIC ECHOCARDIOGRAM performed on 12/03/2021 Left ventricular ejection fraction, by estimation, is 60 to 65%. The left ventricle has normal function. The left ventricle has no regional  wall motion abnormalities. Left ventricular diastolic parameters are  consistent with Grade I diastolic dysfunction (impaired relaxation).  Right ventricular systolic function is normal. The right ventricular size is normal. There is normal pulmonary artery systolic pressure. The estimated right ventricular systolic pressure is 30.6 mmHg.  A small pericardial effusion is present.  The mitral valve is normal in structure. Mild to moderate mitral valve regurgitation. No evidence of mitral stenosis.  The aortic valve is normal in structure. Aortic valve regurgitation is not visualized. No aortic stenosis is present.  The inferior vena cava is normal in size with greater than 50% respiratory variability, suggesting right atrial pressure of 3 mmHg.    CT CORONARY MORPH W/CTA COR W/SCORE W/CA W/CM &/OR WO/CM performed on 10/26/2020 Coronary calcium  score of 52.5. This was 48th percentile for age and sex matched control. Normal coronary origin with right dominance. Calcified plaque causing mild non obstructive CAD in the proximal LAD (25%). CAD-RADS 2. Mild non-obstructive CAD (25-49%). Consider non-atherosclerotic causes of chest pain.  Consider preventive therapy and risk factor modification.  MYOCARDIAL PERFUSION IMAGING STUDY (LEXISCAN ) performed on 06/08/2019 There was no ST segment deviation noted during stress The left ventricular ejection fraction is hyperdynamic (>65%). There is no evidence for ischemia The study is normal. This is a low risk study.  LONG TERM CARDIAC EVENT MONITOR STUDY performed on  08/12/2017 Predominant underlying normal sinus rhythm with an average heart rate of 71 bpm   122 Supraventricular Tachycardia/atrial tachycardia runs occurred, the run with the fastest interval lasting 6 beats with a max rate of 207 bpm, the longest lasting 15.4 secs with an avg rate of 129 bpm.  Isolated SVEs were occasional (1.9%, 73230), SVE Couplets were rare (<1.0%, 343), and SVE Triplets were rare (<1.0%, 60).  Isolated VEs were rare (<1.0%), and no VE Couplets or VE Triplets were present. No significant patient triggered events noted  IMPRESSION AND PLAN: [name] has been referred for pre-anesthesia review and clearance prior to her undergoing the planned anesthetic and procedural courses. Available labs, pertinent testing, and imaging results were personally reviewed by me in preparation for upcoming operative/procedural course. [facility] medical record has been updated following extensive record review and patient interview with PAT staff.   This patient has been appropriately cleared by cardiology with an overall LOW/ACCEPTABLE risk of patient experiencing significant perioperative cardiovascular complications. here at [facility]. Based on clinical review performed today (01/22/24), barring any significant acute changes in the patient's overall condition, it is anticipated that she will be able to proceed with the planned surgical intervention. Any acute changes in clinical condition may necessitate her procedure being postponed and/or cancelled. Patient will  meet with anesthesia team (MD and/or CRNA) on the day of her procedure for preoperative evaluation/assessment. Questions regarding anesthetic course will be fielded at that time.   Pre-surgical instructions were reviewed with the patient during his PAT appointment, and questions were fielded to satisfaction by PAT clinical staff. She has been instructed on which medications that she will need to hold prior to surgery, as well as the ones that have been deemed safe/appropriate to take on the day of her procedure. As part of the general education provided by PAT, patient made aware  both verbally and in writing, that she would need to abstain from the use of any illegal substances during her perioperative course. She was advised that failure to follow the provided instructions could necessitate case cancellation or result in serious perioperative complications up to and including death. Patient encouraged to contact PAT and/or her surgeon's office to discuss any questions or concerns that may arise prior to surgery; verbalized understanding.   Dorise Pereyra, MSN, APRN, FNP-C, CEN [facility]  Perioperative Services Nurse Practitioner Phone: [phone] Fax: [phone] 01/22/24 11:53 AM  NOTE: This note has been prepared using Dragon dictation software. Despite my best ability to proofread, there is always the potential that unintentional transcriptional errors may still occur from this process.

## 2024-01-22 NOTE — Telephone Encounter (Signed)
 1st attempt to reach pt regarding surgical clearance and the need for an TELE appointment.  Left pt a detailed message to call back and get that scheduled.

## 2024-01-22 NOTE — Telephone Encounter (Signed)
   Pre-operative Risk Assessment    Patient Name: [name] [facility]  DOB: 10/27/1945 MRN: [id number]   Date of last office visit: 05/19/23 Timothy Gollan, MD Date of next office visit: 05/26/24 Timothy Gollan, MD   Request for Surgical Clearance    Procedure:  HERNIORRHAPHY, INGUINAL, ROBOT-ASSISTED, LAPAROSCOPIC  Date of Surgery:  Clearance 01/26/24                                Surgeon:  Aloysius Plant, MD  Surgeon's Group or Practice Name:  [facility]  Phone number:  [phone]  Fax number:  Return FAX not required. I will follow up in CHL.    Type of Clearance Requested:   - Medical  - Pharmacy:  Hold CLOPIDOGREL  on hold since 11/19 per surgery direction,   ASA being continued throughout the perioperative course.   Type of Anesthesia:  General    Additional requests/questions:    Signed, Lucie DELENA Ku   01/22/2024, 2:50 PM      Elnor Dorise BRAVO, NP  P Cv Div Preop Callback Request for pre-operative cardiac clearance:   1. What type of surgery is being performed? HERNIORRHAPHY, INGUINAL, ROBOT-ASSISTED, LAPAROSCOPIC  2. When is this surgery scheduled? 01/26/2024   3. Type of clearance being requested (medical, pharmacy, both)? BOTH   4. Are there any medications that need to be held prior to surgery? CLOPIDOGREL  on hold since 11/19 per surgery direction ASA being continued throughout the perioperative course.  5. Practice name and name of physician performing surgery? Performing surgeon: Dr. Aloysius Plant, MD Requesting clearance: Dorise Elnor, FNP-C     6. Anesthesia type (none, local, MAC, general)? GENERAL  7. What is the office phone and fax number?   Phone: [phone] Fax: Return FAX not required. I will follow up in CHL.  ATTENTION: Unable to create telephone message as per your standard workflow. Directed by [facility] providers to send requests for cardiac clearance to this pool for appropriate distribution  to provider covering pre-operative clearances.  Dorise Elnor, MSN, APRN, FNP-C, CEN [facility] Peri-operative Services Nurse Practitioner Phone: [phone] 01/22/24 10:53 AM

## 2024-01-22 NOTE — Patient Instructions (Signed)
 Your procedure is scheduled on: Tuesday 01/26/24 Report to the Registration Desk on the 1st floor of the [facility]. To find out your arrival time, please call [phone] between 1PM - 3PM on: Monday 01/25/24 If your arrival time is 6:00 am, do not arrive before that time as the [facility] entrance doors do not open until 6:00 am.  REMEMBER: Instructions that are not followed completely may result in serious medical risk, up to and including death; or upon the discretion of your surgeon and anesthesiologist your surgery may need to be rescheduled.  Do not eat food after midnight the night before surgery.  No gum chewing or hard candies.  You may however, drink CLEAR liquids up to 2 hours before you are scheduled to arrive for your surgery. Do not drink anything within 2 hours of your scheduled arrival time.  Clear liquids include: - water  - apple juice without pulp - gatorade (not RED colors) - black coffee or tea (Do NOT add milk or creamers to the coffee or tea) Do NOT drink anything that is not on this list.  One week prior to surgery: Stop Anti-inflammatories (NSAIDS) such as Advil, Aleve, Ibuprofen, Motrin, Naproxen, Naprosyn and Aspirin  based products such as Excedrin, Goody's Powder, BC Powder.  You may however, continue to take Tylenol  if needed for pain up until the day of surgery.  Stop ANY OVER THE COUNTER supplements and vitamins TODAY until after surgery.  **Follow recommendations regarding stopping blood thinners.** Plavix  last dose scheduled for 01/20/24  Continue taking all of your other prescription medications up until the day of surgery.  ON THE DAY OF SURGERY ONLY TAKE THESE MEDICATIONS WITH SIPS OF WATER:  rosuvastatin  (CRESTOR ) 5 MG tablet  pantoprazole  (PROTONIX ) 40 MG tablet  hydrOXYzine (ATARAX) 25 MG tablet   Use inhalers on the day of surgery and bring to the hospital. (You may also use your nasal spray if needed)  No Alcohol for 24 hours  before or after surgery.  No Smoking including e-cigarettes for 24 hours before surgery.  No chewable tobacco products for at least 6 hours before surgery.  No nicotine patches on the day of surgery.  Do not use any recreational drugs for at least a week (preferably 2 weeks) before your surgery.  Please be advised that the combination of cocaine and anesthesia may have negative outcomes, up to and including death. If you test positive for cocaine, your surgery will be cancelled.  On the morning of surgery brush your teeth with toothpaste and water, you may rinse your mouth with mouthwash if you wish. Do not swallow any toothpaste or mouthwash.  Shower prior to arrival for surgery. (You may also pick up the CHG soap at our office)  Do not wear jewelry, make-up, hairpins, clips or nail polish.  For welded (permanent) jewelry: bracelets, anklets, waist bands, etc.  Please have this removed prior to surgery.  If it is not removed, there is a chance that hospital personnel will need to cut it off on the day of surgery.  Do not wear lotions, powders, or perfumes.   Do not shave body hair from the neck down 48 hours before surgery.  Wear comfortable clothing (specific to your surgery type) to the hospital.  Contact lenses, hearing aids and dentures may not be worn into surgery. If you have any of these items bring something to put them in.  Do not bring valuables to the hospital. [facility] is not responsible for any  missing/lost belongings or valuables.   Notify your doctor if there is any change in your medical condition (cold, fever, infection).  After surgery, you can help prevent lung complications by doing breathing exercises.  Take deep breaths and cough every 1-2 hours. Your doctor may order a device called an Incentive Spirometer to help you take deep breaths.  When coughing or sneezing, hold a pillow firmly against your incision with both hands. This is called "splinting."  Doing this helps protect your incision. It also decreases belly discomfort.  If you are being discharged the day of surgery, you will not be allowed to drive home. You will need a responsible individual to drive you home and stay with you for 24 hours after surgery.   Please call the Pre-admissions Testing Dept. at [phone] if you have any questions about these instructions.  Surgery Visitation Policy:  Patients having surgery or a procedure may have two visitors.  Children under the age of 23 must have an adult with them who is not the patient.   Merchandiser, Retail to address health-related social needs:  [email or web address]                                                                                                             Preparing for Surgery with CHLORHEXIDINE  GLUCONATE (CHG) Soap  Chlorhexidine  Gluconate (CHG) Soap  o An antiseptic cleaner that kills germs and bonds with the skin to continue killing germs even after washing  o Used for showering the night before surgery and morning of surgery  Before surgery, you can play an important role by reducing the number of germs on your skin.  CHG (Chlorhexidine  gluconate) soap is an antiseptic cleanser which kills germs and bonds with the skin to continue killing germs even after washing.  Please do not use if you have an allergy  to CHG or antibacterial soaps. If your skin becomes reddened/irritated stop using the CHG.  1. Shower the NIGHT BEFORE SURGERY with CHG soap.  2. If you choose to wash your hair, wash your hair first as usual with your normal shampoo.  3. After shampooing, rinse your hair and body thoroughly to remove the shampoo.  4. Use CHG as you would any other liquid soap. You can apply CHG directly to the skin and wash gently with a clean washcloth.  5. Apply the CHG soap to your body only from the neck down. Do not use on open wounds or open sores. Avoid contact with your eyes,  ears, mouth, and genitals (private parts). Wash face and genitals (private parts) with your normal soap.  6. Wash thoroughly, paying special attention to the area where your surgery will be performed.  7. Thoroughly rinse your body with warm water.  8. Do not shower/wash with your normal soap after using and rinsing off the CHG soap.  9. Do not use lotions, oils, etc., after showering with CHG.  10. Pat yourself dry with a clean towel.  11. Wear clean pajamas to bed the night before surgery.  12. Place clean sheets on your bed the night of your shower and do not sleep with pets.  13. Do not apply any deodorants/lotions/powders.  14. Please wear clean clothes to the hospital.  15. Remember to brush your teeth with your regular toothpaste.

## 2024-01-25 ENCOUNTER — Ambulatory Visit: Attending: Cardiology | Admitting: Cardiology

## 2024-01-25 DIAGNOSIS — Z01810 Encounter for preprocedural cardiovascular examination: Secondary | ICD-10-CM | POA: Diagnosis not present

## 2024-01-25 DIAGNOSIS — Z01818 Encounter for other preprocedural examination: Secondary | ICD-10-CM

## 2024-01-25 NOTE — Progress Notes (Signed)
 Virtual Visit via Telephone Note   Because of [name] co-morbid illnesses, she is at least at moderate risk for complications without adequate follow up.  This format is felt to be most appropriate for this patient at this time.  Due to technical limitations with video connection (technology), today's appointment will be conducted as an audio only telehealth visit, and [name] verbally agreed to proceed in this manner.   All issues noted in this document were discussed and addressed.  No physical exam could be performed with this format.  Evaluation Performed:  Preoperative cardiovascular risk assessment _____________   Date:  01/25/2024   Patient ID:  [name], DOB 1945-05-04, MRN [id number] Patient Location:  Home Provider location:   Office  Primary Care Provider:  Geofm Glade PARAS, MD Primary Cardiologist:  None  Chief Complaint / Patient Profile   78 y.o. y/o female with a h/o nonobstructive CAD per coronary CTA, mild to moderate mitral regurgitation, PAD, chronic mesenteric ischemia s/p SMA, fibromuscular dysplasia of renal artery, mild carotid artery stenosis, dyslipidemia who is pending HERNIORRHAPHY, INGUINAL, ROBOT-ASSISTED, LAPAROSCOPIC  and presents today for telephonic preoperative cardiovascular risk assessment.  History of Present Illness    [name] is a 78 y.o. female who presents via audio/video conferencing for a telehealth visit today.  Pt was last seen in cardiology clinic on 05/19/2023 by Dr. Gollan.  At that time [name] was doing well from a cardiac perspective.  The patient is now pending procedure as outlined above. Since her last visit, she has been doing well cardiac wise, she is independent and able to meet > 4 METs of physical activity. She denies chest pain, palpitations, dyspnea, pnd, orthopnea, n, v, dizziness, syncope, edema, weight gain, or early satiety.    Past Medical History    Past Medical History:  Diagnosis Date   Allergy     Anemia    Angina pectoris    Anxiety    Aortic atherosclerosis    Asthma    Bilateral carotid artery disease    Bradycardia    CAD (coronary artery disease)    Chronic shortness of breath    Complication of anesthesia    a.) PONV   Depression    Diastolic dysfunction    Diverticulosis    Duodenal diverticulum    Family history of adverse reaction to anesthesia    a.) PONV in first degree female relatives (mother and daughters)   Fibromyalgia    Gastroesophageal reflux disease with hiatal hernia    Hiatal hernia    History of bilateral cataract extraction    History of kidney stones    Hyperlipidemia    IBS (irritable bowel syndrome)    Insomnia    a.) takes trazodone  as needed   Left inguinal hernia    Long term current use of clopidogrel     Long-term use of aspirin  therapy    Lymphocytic colitis    Migraine headache    Mitral valve prolapse    Neuropathy    Orthostatic hypotension    OSA (obstructive sleep apnea)    a.) non-compliant with prescribed nocturnal PAP therapy; unable to tolerate mask per report   Osteoporosis    Palpitations    Peripheral neuropathy    PONV (postoperative nausea and vomiting)    PSVT (paroxysmal supraventricular tachycardia)    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    RLS (restless legs syndrome)    Schatzki's ring  a.) s/p esophageal dilitation procedures   Spondylosis    Superior mesenteric artery stenosis    Past Surgical History:  Procedure Laterality Date   ANAL RECTAL MANOMETRY N/A 11/14/2015   Procedure: ANO RECTAL MANOMETRY;  Surgeon: Lupita FORBES Commander, MD;  Location: [facility];  Service: Endoscopy;  Laterality: N/A;   BREAST ENHANCEMENT SURGERY     CATARACT EXTRACTION Bilateral    CHOLECYSTECTOMY     COLONOSCOPY     ESOPHAGEAL DILATION     X 2   RENAL INTERVENTION N/A 10/07/2023   Procedure: RENAL INTERVENTION;  Surgeon:  Marea Selinda RAMAN, MD;  Location: [facility];  Service: Cardiovascular;  Laterality: N/A;   ROTATOR CUFF REPAIR Left    SINUS ENDO W/FUSION N/A 05/30/2014   Procedure: REVISION  FRONTAL SINUS SURGERY WITH FUSION SCAN;  Surgeon: Alm Bouche, MD;  Location: [facility] OR;  Service: ENT;  Laterality: N/A;   SINUS SURGERY WITH INSTATRAK     x 2   TUBAL LIGATION     UPPER GASTROINTESTINAL ENDOSCOPY     Vaginal cystectomy     x 2   VAGINAL HYSTERECTOMY  05/2007   Vaginal repair, Dr Cary.  Partial  hysterectomy.   VISCERAL ANGIOGRAPHY N/A 10/07/2023   Procedure: VISCERAL ANGIOGRAPHY;  Surgeon: Marea Selinda RAMAN, MD;  Location: [facility];  Service: Cardiovascular;  Laterality: N/A;   WRIST ARTHROSCOPY      Allergies  Allergies  Allergen Reactions   Tape Rash   Albuterol      heartburn   Latex Other (See Comments)   Lyrica  [Pregabalin ] Other (See Comments)    Sedation   Neurontin  [Gabapentin ] Other (See Comments)    Sedation   Nitrofuran Derivatives Other (See Comments)    tingling   Paxil [Paroxetine Hcl]     Bowel upset, tingling 01/22/24 pt taking generic   Prozac  [Fluoxetine  Hcl] Other (See Comments)    Made patient worse and constipation   Cymbalta  [Duloxetine  Hcl] Nausea Only and Anxiety    Diarrhea, nausea, anxiety worse, shaky    Home Medications    Prior to Admission medications   Medication Sig Start Date End Date Taking? Authorizing Provider  aspirin  EC 81 MG tablet Take 1 tablet (81 mg total) by mouth daily. Swallow whole. 10/07/23 10/06/24  Marea Selinda RAMAN, MD  Azelastine -Fluticasone  137-50 MCG/ACT SUSP PLACE 1 SPRAY INTO THE NOSE EVERY 12 (TWELVE) HOURS. Patient taking differently: Place 1 spray into both nostrils daily as needed (allergies). 06/30/23   Geofm Glade PARAS, MD  budeson-glycopyrrolate -formoterol  (BREZTRI  AEROSPHERE) 160-9-4.8 MCG/ACT AERO Inhale 2 puffs into the lungs 2 (two) times daily. 06/02/23   Geofm Glade PARAS, MD  clopidogrel  (PLAVIX ) 75 MG tablet  Take 1 tablet (75 mg total) by mouth daily. 10/07/23   Marea Selinda RAMAN, MD  Cyanocobalamin  (VITAMIN B-12 PO) Take 1 tablet by mouth daily.    [provider]  famotidine  (PEPCID ) 40 MG tablet Take 1 tablet (40 mg total) by mouth 2 (two) times daily as needed for heartburn or indigestion. 08/07/23   Geofm Glade PARAS, MD  fluticasone -salmeterol (WIXELA INHUB) 100-50 MCG/ACT AEPB Inhale 1 puff into the lungs 2 (two) times daily. 08/07/23   Geofm Glade PARAS, MD  hydrOXYzine (ATARAX) 25 MG tablet Take 25 mg by mouth daily. 01/14/23   [provider]  levalbuterol  (XOPENEX  HFA) 45 MCG/ACT inhaler Inhale 1-2 puffs into the lungs every 4 (four) hours as needed for wheezing. 06/02/23   Geofm Glade PARAS, MD  midodrine  (PROAMATINE ) 10 MG tablet Take 1 tablet (10 mg total) by mouth 2 (two) times daily as needed (for SBP 100 or less). 06/02/23   Geofm Glade PARAS, MD  ondansetron  (ZOFRAN -ODT) 4 MG disintegrating tablet Take 1 tablet (4 mg total) by mouth every 8 (eight) hours as needed for nausea or vomiting. 08/07/23   Geofm Glade PARAS, MD  OVER THE COUNTER MEDICATION Bone Up 1000 mg 2 tabs TID    [provider]  OVER THE COUNTER MEDICATION B-Right (B-Complex) 1 po daily    [provider]  pantoprazole  (PROTONIX ) 40 MG tablet Take 40 mg by mouth daily.    [provider]  PARoxetine (PAXIL) 20 MG tablet Take 20 mg by mouth at bedtime. 06/02/23   [provider]  potassium chloride  (KLOR-CON ) 20 MEQ packet DISSOLVE CONTENTS OF 1 PACKET DAILY 12/29/23   Avram Lupita BRAVO, MD  Probiotic Product (ALIGN) 4 MG CAPS Take 1 capsule by mouth daily.    [provider]  RESTASIS 0.05 % ophthalmic emulsion Place 1 drop into both eyes 2 (two) times daily. 02/15/21   [provider]  rosuvastatin  (CRESTOR ) 5 MG tablet TAKE 1 TABLET (5 MG TOTAL) BY MOUTH DAILY FOR CHOLESTEROL 07/23/23   Burns, Glade PARAS, MD  traZODone  (DESYREL ) 100 MG tablet Take 100 mg by mouth at bedtime.    [provider]  traZODone  (DESYREL ) 50 MG tablet Take 1 tablet (50 mg total) by mouth at bedtime. 09/09/23   Leonce Katz, DO  vitamin C (ASCORBIC ACID) 500 MG tablet Take 500 mg by mouth daily.    [provider]  vitamin E 180 MG (400 UNITS) capsule Take 400 Units by mouth daily.    [provider]  Hyoscyamine -Phenyltoloxamine (DIGEX NF) 0.0625-15 MG CAPS Take one tablet by mouth as needed for abdominal cramping 04/15/11 05/20/11  Jakie Alm SAUNDERS, MD    Physical Exam    Vital Signs:  [name] does not have vital signs available for review today.  Given telephonic nature of communication, physical exam is limited. AAOx3. NAD. Normal affect.  Speech and respirations are unlabored.  Accessory Clinical Findings    None  Assessment & Plan    1.  Preoperative Cardiovascular Risk Assessment:     [name] perioperative risk of a major cardiac event is 0.9% according to the Revised Cardiac Risk Index (RCRI).  Therefore, she is at low risk for perioperative complications.   Her functional capacity is good at 5.07 METs according to the Duke Activity Status Index (DASI). Recommendations: According to ACC/AHA guidelines, no further cardiovascular testing needed.  The patient may proceed to surgery at acceptable risk.   Antiplatelet and/or Anticoagulation Recommendations: Surgery is scheduled for tomorrow and she has already been instructed to hold her Plavix  and aspirin .   The patient was advised that if she develops new symptoms prior to surgery to contact our office to arrange for a follow-up visit, and she verbalized understanding.  A copy of this note will be routed to requesting surgeon.  Time:   Today, I have spent 10 minutes with the patient with telehealth technology discussing medical history, symptoms, and management plan.     Delon JAYSON Hoover, NP  01/25/2024, 12:08 PM

## 2024-01-26 ENCOUNTER — Other Ambulatory Visit: Payer: Self-pay

## 2024-01-26 ENCOUNTER — Encounter: Payer: Self-pay | Admitting: Surgery

## 2024-01-26 ENCOUNTER — Ambulatory Visit: Payer: Self-pay | Admitting: Urgent Care

## 2024-01-26 ENCOUNTER — Encounter
Admission: RE | Disposition: A | Discharge status: Home / Self Care | Payer: Self-pay | Source: Home / Self Care | Attending: Surgery

## 2024-01-26 ENCOUNTER — Ambulatory Visit
Admission: RE | Admit: 2024-01-26 | Discharge: 2024-01-26 | Disposition: A | Discharge status: Home / Self Care | Attending: Surgery | Admitting: Surgery

## 2024-01-26 DIAGNOSIS — G629 Polyneuropathy, unspecified: Secondary | ICD-10-CM | POA: Insufficient documentation

## 2024-01-26 DIAGNOSIS — J45909 Unspecified asthma, uncomplicated: Secondary | ICD-10-CM | POA: Insufficient documentation

## 2024-01-26 DIAGNOSIS — Z87891 Personal history of nicotine dependence: Secondary | ICD-10-CM | POA: Insufficient documentation

## 2024-01-26 DIAGNOSIS — K449 Diaphragmatic hernia without obstruction or gangrene: Secondary | ICD-10-CM | POA: Insufficient documentation

## 2024-01-26 DIAGNOSIS — I2510 Atherosclerotic heart disease of native coronary artery without angina pectoris: Secondary | ICD-10-CM | POA: Diagnosis not present

## 2024-01-26 DIAGNOSIS — G4733 Obstructive sleep apnea (adult) (pediatric): Secondary | ICD-10-CM | POA: Insufficient documentation

## 2024-01-26 DIAGNOSIS — I739 Peripheral vascular disease, unspecified: Secondary | ICD-10-CM | POA: Diagnosis not present

## 2024-01-26 DIAGNOSIS — K219 Gastro-esophageal reflux disease without esophagitis: Secondary | ICD-10-CM | POA: Insufficient documentation

## 2024-01-26 DIAGNOSIS — Z79899 Other long term (current) drug therapy: Secondary | ICD-10-CM | POA: Diagnosis not present

## 2024-01-26 DIAGNOSIS — M797 Fibromyalgia: Secondary | ICD-10-CM | POA: Diagnosis not present

## 2024-01-26 DIAGNOSIS — Z9862 Peripheral vascular angioplasty status: Secondary | ICD-10-CM | POA: Diagnosis not present

## 2024-01-26 DIAGNOSIS — E785 Hyperlipidemia, unspecified: Secondary | ICD-10-CM | POA: Insufficient documentation

## 2024-01-26 DIAGNOSIS — Z8249 Family history of ischemic heart disease and other diseases of the circulatory system: Secondary | ICD-10-CM | POA: Diagnosis not present

## 2024-01-26 DIAGNOSIS — K4090 Unilateral inguinal hernia, without obstruction or gangrene, not specified as recurrent: Secondary | ICD-10-CM | POA: Insufficient documentation

## 2024-01-26 HISTORY — DX: Angina pectoris, unspecified: I20.9

## 2024-01-26 HISTORY — PX: INSERTION OF MESH: SHX5868

## 2024-01-26 HISTORY — DX: Unilateral inguinal hernia, without obstruction or gangrene, not specified as recurrent: K40.90

## 2024-01-26 HISTORY — DX: Bradycardia, unspecified: R00.1

## 2024-01-26 HISTORY — DX: Long term (current) use of antithrombotics/antiplatelets: Z79.02

## 2024-01-26 HISTORY — PX: HERNIORRHAPHY, INGUINAL, ROBOT-ASSISTED, LAPAROSCOPIC: SHX7585

## 2024-01-26 HISTORY — DX: Restless legs syndrome: G25.81

## 2024-01-26 HISTORY — DX: Obstructive sleep apnea (adult) (pediatric): G47.33

## 2024-01-26 HISTORY — DX: Supraventricular tachycardia, unspecified: I47.10

## 2024-01-26 HISTORY — DX: Disorder of arteries and arterioles, unspecified: I77.9

## 2024-01-26 HISTORY — DX: Palpitations: R00.2

## 2024-01-26 HISTORY — DX: Cataract extraction status, right eye: Z98.41

## 2024-01-26 HISTORY — DX: Long term (current) use of aspirin: Z79.82

## 2024-01-26 HISTORY — DX: Shortness of breath: R06.02

## 2024-01-26 HISTORY — DX: Atherosclerosis of aorta: I70.0

## 2024-01-26 HISTORY — DX: Atherosclerotic heart disease of native coronary artery without angina pectoris: I25.10

## 2024-01-26 HISTORY — DX: Insomnia, unspecified: G47.00

## 2024-01-26 HISTORY — DX: Other ill-defined heart diseases: I51.89

## 2024-01-26 SURGERY — HERNIORRHAPHY, INGUINAL, ROBOT-ASSISTED, LAPAROSCOPIC
Anesthesia: General | Site: Pelvis | Laterality: Left

## 2024-01-26 MED ORDER — PROPOFOL 1000 MG/100ML IV EMUL
INTRAVENOUS | Status: AC
  Filled 2024-01-26: qty 100

## 2024-01-26 MED ORDER — ACETAMINOPHEN 500 MG PO TABS: 1000.0000 mg | ORAL_TABLET | Freq: Four times a day (QID) | ORAL | Status: DC | PRN

## 2024-01-26 MED ORDER — SUGAMMADEX SODIUM 200 MG/2ML IV SOLN
INTRAVENOUS | Status: DC | PRN
  Administered 2024-01-26: 200 mg via INTRAVENOUS

## 2024-01-26 MED ORDER — ACETAMINOPHEN 500 MG PO TABS
1000.0000 mg | ORAL_TABLET | ORAL | Status: AC
  Administered 2024-01-26: 1000 mg via ORAL

## 2024-01-26 MED ORDER — CEFAZOLIN SODIUM-DEXTROSE 2-4 GM/100ML-% IV SOLN
INTRAVENOUS | Status: AC
  Filled 2024-01-26: qty 100

## 2024-01-26 MED ORDER — CHLORHEXIDINE GLUCONATE CLOTH 2 % EX PADS
6.0000 | MEDICATED_PAD | Freq: Once | CUTANEOUS | Status: AC
  Administered 2024-01-26: 6 via TOPICAL

## 2024-01-26 MED ORDER — OXYCODONE HCL 5 MG PO TABS
5.0000 mg | ORAL_TABLET | Freq: Once | ORAL | Status: AC | PRN
  Administered 2024-01-26: 5 mg via ORAL

## 2024-01-26 MED ORDER — FENTANYL CITRATE (PF) 100 MCG/2ML IJ SOLN
INTRAMUSCULAR | Status: DC | PRN
  Administered 2024-01-26 (×2): 50 ug via INTRAVENOUS

## 2024-01-26 MED ORDER — PROPOFOL 10 MG/ML IV BOLUS
INTRAVENOUS | Status: AC
  Filled 2024-01-26: qty 20

## 2024-01-26 MED ORDER — PROPOFOL 500 MG/50ML IV EMUL
INTRAVENOUS | Status: DC | PRN
  Administered 2024-01-26: 125 ug/kg/min via INTRAVENOUS

## 2024-01-26 MED ORDER — 0.9 % SODIUM CHLORIDE (POUR BTL) OPTIME
TOPICAL | Status: DC | PRN
  Administered 2024-01-26: 500 mL

## 2024-01-26 MED ORDER — PROPOFOL 10 MG/ML IV BOLUS
INTRAVENOUS | Status: DC | PRN
  Administered 2024-01-26: 100 mg via INTRAVENOUS

## 2024-01-26 MED ORDER — OXYCODONE HCL 5 MG PO TABS: 5.0000 mg | ORAL_TABLET | ORAL | 0 refills | Status: DC | PRN

## 2024-01-26 MED ORDER — FENTANYL CITRATE (PF) 100 MCG/2ML IJ SOLN: 25.0000 ug | INTRAMUSCULAR | Status: DC | PRN

## 2024-01-26 MED ORDER — OXYCODONE HCL 5 MG/5ML PO SOLN: 5.0000 mg | Freq: Once | ORAL | Status: AC | PRN

## 2024-01-26 MED ORDER — ONDANSETRON HCL 4 MG/2ML IJ SOLN: 4.0000 mg | Freq: Once | INTRAMUSCULAR | Status: DC | PRN

## 2024-01-26 MED ORDER — GLYCOPYRROLATE 0.2 MG/ML IJ SOLN
INTRAMUSCULAR | Status: DC | PRN
  Administered 2024-01-26: .2 mg via INTRAVENOUS

## 2024-01-26 MED ORDER — ONDANSETRON HCL 4 MG/2ML IJ SOLN
INTRAMUSCULAR | Status: DC | PRN
  Administered 2024-01-26: 4 mg via INTRAVENOUS

## 2024-01-26 MED ORDER — ACETAMINOPHEN 500 MG PO TABS
ORAL_TABLET | ORAL | Status: AC
  Filled 2024-01-26: qty 2

## 2024-01-26 MED ORDER — CHLORHEXIDINE GLUCONATE 0.12 % MT SOLN
OROMUCOSAL | Status: AC
  Filled 2024-01-26: qty 15

## 2024-01-26 MED ORDER — LIDOCAINE HCL (PF) 2 % IJ SOLN
INTRAMUSCULAR | Status: AC
  Filled 2024-01-26: qty 5

## 2024-01-26 MED ORDER — BUPIVACAINE-EPINEPHRINE (PF) 0.5% -1:200000 IJ SOLN
INTRAMUSCULAR | Status: AC
  Filled 2024-01-26: qty 30

## 2024-01-26 MED ORDER — OXYCODONE HCL 5 MG PO TABS
ORAL_TABLET | ORAL | Status: AC
  Filled 2024-01-26: qty 1

## 2024-01-26 MED ORDER — LIDOCAINE HCL (CARDIAC) PF 100 MG/5ML IV SOSY
PREFILLED_SYRINGE | INTRAVENOUS | Status: DC | PRN
  Administered 2024-01-26: 80 mg via INTRAVENOUS

## 2024-01-26 MED ORDER — ORAL CARE MOUTH RINSE: 15.0000 mL | Freq: Once | OROMUCOSAL | Status: AC

## 2024-01-26 MED ORDER — BUPIVACAINE LIPOSOME 1.3 % IJ SUSP
INTRAMUSCULAR | Status: AC
Start: 2024-01-26 — End: 2024-01-26
  Filled 2024-01-26: qty 10

## 2024-01-26 MED ORDER — KETOROLAC TROMETHAMINE 30 MG/ML IJ SOLN
INTRAMUSCULAR | Status: AC
Start: 2024-01-26 — End: 2024-01-26
  Filled 2024-01-26: qty 1

## 2024-01-26 MED ORDER — KETOROLAC TROMETHAMINE 30 MG/ML IJ SOLN
INTRAMUSCULAR | Status: DC | PRN
Start: 2024-01-26 — End: 2024-01-26
  Administered 2024-01-26: 15 mg via INTRAVENOUS

## 2024-01-26 MED ORDER — CEFAZOLIN SODIUM-DEXTROSE 2-4 GM/100ML-% IV SOLN
2.0000 g | INTRAVENOUS | Status: AC
  Administered 2024-01-26: 2 g via INTRAVENOUS

## 2024-01-26 MED ORDER — CHLORHEXIDINE GLUCONATE CLOTH 2 % EX PADS: 6.0000 | MEDICATED_PAD | Freq: Once | CUTANEOUS | Status: DC

## 2024-01-26 MED ORDER — BUPIVACAINE LIPOSOME 1.3 % IJ SUSP: 10.0000 mL | Freq: Once | INTRAMUSCULAR | Status: DC

## 2024-01-26 MED ORDER — GABAPENTIN 100 MG PO CAPS
ORAL_CAPSULE | ORAL | Status: AC
  Filled 2024-01-26: qty 2

## 2024-01-26 MED ORDER — FENTANYL CITRATE (PF) 100 MCG/2ML IJ SOLN
INTRAMUSCULAR | Status: AC
  Filled 2024-01-26: qty 2

## 2024-01-26 MED ORDER — LACTATED RINGERS IV SOLN: INTRAVENOUS | Status: DC

## 2024-01-26 MED ORDER — ROCURONIUM BROMIDE 10 MG/ML (PF) SYRINGE
PREFILLED_SYRINGE | INTRAVENOUS | Status: AC
  Filled 2024-01-26: qty 10

## 2024-01-26 MED ORDER — BUPIVACAINE LIPOSOME 1.3 % IJ SUSP
INTRAMUSCULAR | Status: DC | PRN
  Administered 2024-01-26: 40 mL via SURGICAL_CAVITY

## 2024-01-26 MED ORDER — GABAPENTIN 100 MG PO CAPS
200.0000 mg | ORAL_CAPSULE | ORAL | Status: AC
  Administered 2024-01-26: 200 mg via ORAL

## 2024-01-26 MED ORDER — CHLORHEXIDINE GLUCONATE 0.12 % MT SOLN
15.0000 mL | Freq: Once | OROMUCOSAL | Status: AC
Start: 2024-01-26 — End: 2024-01-26
  Administered 2024-01-26: 15 mL via OROMUCOSAL

## 2024-01-26 MED ORDER — ROCURONIUM BROMIDE 100 MG/10ML IV SOLN
INTRAVENOUS | Status: DC | PRN
  Administered 2024-01-26: 50 mg via INTRAVENOUS
  Administered 2024-01-26: 20 mg via INTRAVENOUS

## 2024-01-26 MED ORDER — DEXAMETHASONE SOD PHOSPHATE PF 10 MG/ML IJ SOLN
INTRAMUSCULAR | Status: DC | PRN
  Administered 2024-01-26: 10 mg via INTRAVENOUS

## 2024-01-26 SURGICAL SUPPLY — 37 items
COVER TIP SHEARS 8 DVNC (MISCELLANEOUS) ×2 IMPLANT
COVER WAND RF STERILE (DRAPES) ×2 IMPLANT
DEFOGGER SCOPE WARM SEASHARP (MISCELLANEOUS) ×2 IMPLANT
DERMABOND ADVANCED .7 DNX12 (GAUZE/BANDAGES/DRESSINGS) ×2 IMPLANT
DRAPE ARM DVNC X/XI (DISPOSABLE) ×6 IMPLANT
DRAPE COLUMN DVNC XI (DISPOSABLE) ×2 IMPLANT
ELECTRODE CAUTERY BLDE TIP 2.5 (TIP) ×2 IMPLANT
ELECTRODE REM PT RTRN 9FT ADLT (ELECTROSURGICAL) ×2 IMPLANT
FORCEPS BPLR R/ABLATION 8 DVNC (INSTRUMENTS) ×2 IMPLANT
GLOVE SURG SYN 7.0 PF PI (GLOVE) ×4 IMPLANT
GLOVE SURG SYN 7.5 PF PI (GLOVE) ×4 IMPLANT
GOWN STRL REUS W/ TWL LRG LVL3 (GOWN DISPOSABLE) ×8 IMPLANT
IRRIGATION STRYKERFLOW (MISCELLANEOUS) IMPLANT
IV 0.9% NACL 1000 ML (IV SOLUTION) IMPLANT
IV CATH ANGIO 12GX3 LT BLUE (NEEDLE) IMPLANT
KIT PINK PAD W/HEAD ARM REST (MISCELLANEOUS) ×2 IMPLANT
LABEL OR SOLS (LABEL) ×2 IMPLANT
MANIFOLD NEPTUNE II (INSTRUMENTS) ×2 IMPLANT
MESH 3DMAX MID 4X6 LT LRG (Mesh General) IMPLANT
NDL DRIVE SUT CUT DVNC (INSTRUMENTS) ×2 IMPLANT
NDL HYPO 22X1.5 SAFETY MO (MISCELLANEOUS) ×2 IMPLANT
NDL INSUFFLATION 14GA 120MM (NEEDLE) ×2 IMPLANT
OBTURATOR OPTICALSTD 8 DVNC (TROCAR) ×2 IMPLANT
PACK LAP CHOLECYSTECTOMY (MISCELLANEOUS) ×2 IMPLANT
SCISSORS MNPLR CVD DVNC XI (INSTRUMENTS) ×2 IMPLANT
SEAL UNIV 5-12 XI (MISCELLANEOUS) ×6 IMPLANT
SET TUBE SMOKE EVAC HIGH FLOW (TUBING) ×2 IMPLANT
SOLN STERILE WATER 500 ML (IV SOLUTION) ×2 IMPLANT
SOLUTION ELECTROSURG ANTI STCK (MISCELLANEOUS) ×2 IMPLANT
SPONGE T-LAP 18X18 ~~LOC~~+RFID (SPONGE) ×2 IMPLANT
SUT MNCRL AB 4-0 PS2 18 (SUTURE) ×2 IMPLANT
SUT STRATA 2-0 23CM CT-2 (SUTURE) ×2 IMPLANT
SUT VIC AB 2-0 SH 27XBRD (SUTURE) ×2 IMPLANT
SUT VIC AB 3-0 SH 27X BRD (SUTURE) ×2 IMPLANT
SUT VICRYL 0 UR6 27IN ABS (SUTURE) ×4 IMPLANT
SYSTEM BAG RETRIEVAL 10MM (BASKET) IMPLANT
TRAY FOLEY SLVR 16FR LF STAT (SET/KITS/TRAYS/PACK) ×2 IMPLANT

## 2024-01-26 NOTE — Op Note (Signed)
 Procedure Date:  01/26/2024  Pre-operative Diagnosis:  Left inguinal hernia  Post-operative Diagnosis: Left inguinal hernia  Procedure: 1.  Robotic assisted left Inguinal Hernia Repair 2.  Creation of left Posterior Rectus-Transversalis Fascia Advancment Flap for Coverage of Pelvic Wound (200 cm)  Surgeon:  Aloysius Sheree Plant, MD  Anesthesia:  General endotracheal  Estimated Blood Loss:  5 ml  Specimens:  None  Complications:  None  Indications for Procedure:  This is a 78 y.o. female who presents with a left inguinal hernia.  The options of surgery versus observation were reviewed with the patient and/or family. The risks of bleeding, abscess or infection, recurrence of symptoms, potential for an open procedure, injury to surrounding structures, and chronic pain were all discussed with the patient and she was willing to proceed.  We have planned this transabdominal procedure with the creation of a left peritoneal flap based on the posterior rectus sheath and transversalis fascia in order to fully cover the mesh, creating a natural tisssue barrier for the bowel and peritoneal cavity.  Description of Procedure: The patient was correctly identified in the preoperative area and brought into the operating room.  The patient was placed supine with VTE prophylaxis in place.  Appropriate time-outs were performed.  Anesthesia was induced and the patient was intubated.  Foley catheter was placed.  Appropriate antibiotics were infused.  The abdomen was prepped and draped in a sterile fashion. A supraumbilical incision was made. A cutdown technique was used to enter the abdominal cavity without injury, and a Hasson trocar was inserted.  Pneumoperitoneum was obtained with appropriate opening pressures.  A Veress needle was used to start dissecting the peritoneal flap.  Two 8-mm robotic ports were placed in the right and left lateral positions under direct visualization.  A large left Bard 3D Max Mesh,  a 2-0 Vicryl, and 2-0 stratafix suture were placed through the umbilical port under direct visualization.  The [employer] platform was docked onto the patient, the camera was inserted and targeted, and the instruments were placed under direct visualization.  Both inguinal regions were inspected for hernias and it was confirmed that the patient had a left inguinal hernia.  Using electocautery, the peritoneal and posterior rectus tissue flap was created.  The peritoneum on the left side was scored from the median umbilical ligament laterally towards the ASIS.  The flap was mobilized using robotic scissors and the bipolar instruments, creating a plane along the posterior rectus sheath and transversalis fascia down to the pubic tubercle medially. It was then further mobilized laterally across the inguinal canal and femoral vessels and onto the psoas muscle. The inferior epigastric vessels were identified and preserved. This created a posterior rectus and peritoneal flap measuring roughly 17 cm x 12 cm.  This revealed an indirect inguinal hernia, a direct inguinal hernia, and a femoral hernia.  The hernia sacs and contents were reduced preserving all structures.  A large left Bard 3D Max Mid mesh was placed with good overlap along all the potential hernia defects and secured in place with 2-0 Vicryl along the medial superomedial and superolateral aspects.  This allowed good coverage of all hernia spaces.  Then, the peritoneal flap was advanced over the mesh and carried over to close the defect. A running 2-0 stratafix suture was used to approximate the edge of the flap onto the peritoneum.  All needles were removed under direct visualization.  The 8- mm ports were removed under direct visualization and the Hasson trocar was removed.  The fascial opening was closed using 0 vicryl suture.  Local anesthetic was infused in all incisions as well as a left ilioinguinal block, and the incisions were closed with 4-0 Monocryl.   The wounds were cleaned and sealed with DermaBond.  Foley catheter was removed and the patient was emerged from anesthesia and extubated and brought to the recovery room for further management.  The patient tolerated the procedure well and all counts were correct at the end of the case.   Aloysius Sheree Plant, MD

## 2024-01-26 NOTE — Anesthesia Procedure Notes (Signed)
 Procedure Name: Intubation Date/Time: 01/26/2024 2:24 PM  Performed by: Myra Lawless, CRNAPre-anesthesia Checklist: Patient identified, Patient being monitored, Timeout performed, Emergency Drugs available and Suction available Patient Re-evaluated:Patient Re-evaluated prior to induction Oxygen Delivery Method: Circle system utilized Preoxygenation: Pre-oxygenation with 100% oxygen Induction Type: IV induction Ventilation: Mask ventilation without difficulty Laryngoscope Size: Mac and 3 Grade View: Grade I Tube type: Oral Tube size: 7.0 mm Number of attempts: 1 Airway Equipment and Method: Stylet Placement Confirmation: ETT inserted through vocal cords under direct vision, positive ETCO2 and breath sounds checked- equal and bilateral Secured at: 21 cm Tube secured with: Tape Dental Injury: Teeth and Oropharynx as per pre-operative assessment

## 2024-01-26 NOTE — Discharge Instructions (Signed)
 Discharge instructions: 1.  Patient may shower, but do not scrub wounds heavily and dab dry only. 2.  Do not submerge wounds in pool/tub until fully healed. 3.  Do not apply ointments or hydrogen peroxide to the wounds. 4.  May apply ice packs to the wounds for comfort. 5.  It is normal for there to be bruising or swelling or puffiness of the groin area.  This will resolve on its own. 6.  May resume your Plavix  on 01/28/24. 7.  Do not drive while taking narcotics for pain control.  Prior to driving, make sure you are able to rotate right and left to look at blindspots without significant pain or discomfort. 8.  No heavy lifting or pushing of more than 10-15 lbs for 4 weeks.

## 2024-01-26 NOTE — Interval H&P Note (Signed)
 History and Physical Interval Note:  01/26/2024 4:11 PM  [name]  has presented today for surgery, with the diagnosis of Left inguinal hernia.  The various methods of treatment have been discussed with the patient and family. After consideration of risks, benefits and other options for treatment, the patient has consented to  Procedure(s): HERNIORRHAPHY, INGUINAL, ROBOT-ASSISTED, LAPAROSCOPIC (Left) INSERTION OF MESH as a surgical intervention.  The patient's history has been reviewed, patient examined, no change in status, stable for surgery.  I have reviewed the patient's chart and labs.  Questions were answered to the patient's satisfaction.     Ilyssa Grennan

## 2024-01-26 NOTE — Anesthesia Preprocedure Evaluation (Addendum)
 Anesthesia Evaluation  Patient identified by MRN, date of birth, ID band Patient awake    Reviewed: Allergy  & Precautions, NPO status , Patient's Chart, lab work & pertinent test results  History of Anesthesia Complications (+) PONV and history of anesthetic complications  Airway Mallampati: III  TM Distance: <3 FB Neck ROM: full    Dental  (+) Teeth Intact, Caps   Pulmonary asthma , sleep apnea , former smoker   Pulmonary exam normal        Cardiovascular Exercise Tolerance: Good + CAD and + Peripheral Vascular Disease  negative cardio ROS Normal cardiovascular exam Rhythm:Regular Rate:Normal     Neuro/Psych  Headaches  Anxiety     negative neurological ROS  negative psych ROS   GI/Hepatic negative GI ROS, Neg liver ROS, hiatal hernia,GERD  Medicated,,  Endo/Other  negative endocrine ROS    Renal/GU   negative genitourinary   Musculoskeletal   Abdominal Normal abdominal exam  (+)   Peds negative pediatric ROS (+)  Hematology negative hematology ROS (+) Blood dyscrasia, anemia   Anesthesia Other Findings Past Medical History: No date: Allergy  No date: Anemia No date: Angina pectoris No date: Anxiety No date: Aortic atherosclerosis No date: Asthma No date: Bilateral carotid artery disease No date: Bradycardia No date: CAD (coronary artery disease) No date: Chronic shortness of breath No date: Complication of anesthesia     Comment:  a.) PONV No date: Depression No date: Diastolic dysfunction No date: Diverticulosis No date: Duodenal diverticulum No date: Family history of adverse reaction to anesthesia     Comment:  a.) PONV in first degree female relatives (mother and               daughters) No date: Fibromyalgia No date: Gastroesophageal reflux disease with hiatal hernia No date: Hiatal hernia No date: History of bilateral cataract extraction No date: History of kidney stones No date:  Hyperlipidemia No date: IBS (irritable bowel syndrome) No date: Insomnia     Comment:  a.) takes trazodone  as needed No date: Left inguinal hernia No date: Long term current use of clopidogrel  No date: Long-term use of aspirin  therapy No date: Lymphocytic colitis No date: Migraine headache No date: Mitral valve prolapse No date: Neuropathy No date: Orthostatic hypotension No date: OSA (obstructive sleep apnea)     Comment:  a.) non-compliant with prescribed nocturnal PAP therapy;              unable to tolerate mask per report No date: Osteoporosis No date: Palpitations No date: Peripheral neuropathy No date: PONV (postoperative nausea and vomiting) No date: PSVT (paroxysmal supraventricular tachycardia) No date: 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 No date: RLS (restless legs syndrome) No date: Schatzki's ring     Comment:  a.) s/p esophageal dilitation procedures No date: Spondylosis 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  Past Surgical History: 11/14/2015: ANAL RECTAL MANOMETRY; N/A     Comment:  Procedure: ANO RECTAL MANOMETRY;  Surgeon: Lupita FORBES Commander, MD;  Location: [facility];  Service:               Endoscopy;  Laterality: N/A; No date: BREAST ENHANCEMENT SURGERY No date: CATARACT EXTRACTION; Bilateral No date: CHOLECYSTECTOMY No date: COLONOSCOPY No date: ESOPHAGEAL DILATION     Comment:  X 2 10/07/2023: RENAL INTERVENTION; N/A     Comment:  Procedure: RENAL INTERVENTION;  Surgeon: Marea Selinda RAMAN,  MD;  Location: [facility];  Service:               Cardiovascular;  Laterality: N/A; No date: ROTATOR CUFF REPAIR; Left 05/30/2014: SINUS ENDO W/FUSION; N/A     Comment:  Procedure: REVISION  FRONTAL SINUS SURGERY WITH FUSION               SCAN;  Surgeon: Alm Bouche, MD;  Location: [facility] OR;                Service: ENT;  Laterality: N/A; No date: SINUS SURGERY WITH INSTATRAK     Comment:  x 2 No date:  TUBAL LIGATION No date: UPPER GASTROINTESTINAL ENDOSCOPY No date: Vaginal cystectomy     Comment:  x 2 05/2007: VAGINAL HYSTERECTOMY     Comment:  Vaginal repair, Dr Cary.  Partial  hysterectomy. 10/07/2023: VISCERAL ANGIOGRAPHY; N/A     Comment:  Procedure: VISCERAL ANGIOGRAPHY;  Surgeon: Marea Selinda RAMAN,              MD;  Location: [facility];  Service:               Cardiovascular;  Laterality: N/A; No date: WRIST ARTHROSCOPY     Reproductive/Obstetrics negative OB ROS                              Anesthesia Physical Anesthesia Plan  ASA: 3  Anesthesia Plan: General   Post-op Pain Management:    Induction: Intravenous  PONV Risk Score and Plan: Ondansetron , Dexamethasone , Midazolam  and Treatment may vary due to age or medical condition  Airway Management Planned: Oral ETT  Additional Equipment:   Intra-op Plan:   Post-operative Plan: Extubation in OR  Informed Consent: I have reviewed the patients History and Physical, chart, labs and discussed the procedure including the risks, benefits and alternatives for the proposed anesthesia with the patient or authorized representative who has indicated his/her understanding and acceptance.     Dental Advisory Given  Plan Discussed with: CRNA  Anesthesia Plan Comments:          Anesthesia Quick Evaluation

## 2024-01-26 NOTE — Transfer of Care (Signed)
 Immediate Anesthesia Transfer of Care Note  Patient: [name]  Procedure(s) Performed: HERNIORRHAPHY, INGUINAL, ROBOT-ASSISTED, LAPAROSCOPIC (Left: Pelvis) INSERTION OF MESH (Pelvis)  Patient Location: PACU  Anesthesia Type:General  Level of Consciousness: drowsy  Airway & Oxygen Therapy: Patient Spontanous Breathing and Patient connected to face mask oxygen  Post-op Assessment: Report given to RN, Post -op Vital signs reviewed and stable, and Patient moving all extremities X 4  Post vital signs: Reviewed and stable  Last Vitals:  Vitals Value Taken Time  BP 143/75 01/26/24 16:15  Temp    Pulse 70 01/26/24 16:16  Resp 23 01/26/24 16:16  SpO2 99 % 01/26/24 16:16  Vitals shown include unfiled device data.  Last Pain:  Vitals:   01/26/24 1312  TempSrc: Temporal  PainSc: 0-No pain         Complications: No notable events documented.

## 2024-01-27 ENCOUNTER — Encounter: Payer: Self-pay | Admitting: Surgery

## 2024-01-29 ENCOUNTER — Other Ambulatory Visit: Payer: Self-pay | Admitting: Internal Medicine

## 2024-02-03 ENCOUNTER — Encounter: Payer: Self-pay | Admitting: Internal Medicine

## 2024-02-04 ENCOUNTER — Ambulatory Visit: Payer: 59 | Admitting: Internal Medicine

## 2024-02-04 NOTE — Anesthesia Postprocedure Evaluation (Signed)
 Anesthesia Post Note  Patient: [name]  Procedure(s) Performed: HERNIORRHAPHY, INGUINAL, ROBOT-ASSISTED, LAPAROSCOPIC (Left: Pelvis) INSERTION OF MESH (Pelvis)  Patient location during evaluation: PACU Anesthesia Type: General Level of consciousness: awake and alert Pain management: pain level controlled Vital Signs Assessment: post-procedure vital signs reviewed and stable Respiratory status: spontaneous breathing, nonlabored ventilation, respiratory function stable and patient connected to nasal cannula oxygen Cardiovascular status: blood pressure returned to baseline and stable Postop Assessment: no apparent nausea or vomiting Anesthetic complications: no   No notable events documented.   Last Vitals:  Vitals:   01/26/24 1650 01/26/24 1707  BP: 127/79 139/67  Pulse: 74 63  Resp: 15 16  Temp:  (!) 36.4 C  SpO2: 93% 98%    Last Pain:  Vitals:   01/26/24 1707  TempSrc: Temporal  PainSc: 2                  Prentice Murphy

## 2024-02-06 ENCOUNTER — Other Ambulatory Visit: Payer: Self-pay | Admitting: Internal Medicine

## 2024-02-09 ENCOUNTER — Encounter
Admission: EM | Disposition: E | Payer: Self-pay | Source: Home / Self Care | Attending: Student in an Organized Health Care Education/Training Program

## 2024-02-09 ENCOUNTER — Encounter: Payer: Self-pay | Admitting: Internal Medicine

## 2024-02-09 ENCOUNTER — Inpatient Hospital Stay
Admission: EM | Admit: 2024-02-09 | Discharge: 2024-03-03 | DRG: 642 | Disposition: E | Attending: Student in an Organized Health Care Education/Training Program | Admitting: Student in an Organized Health Care Education/Training Program

## 2024-02-09 ENCOUNTER — Emergency Department

## 2024-02-09 ENCOUNTER — Emergency Department: Admitting: Registered Nurse

## 2024-02-09 ENCOUNTER — Other Ambulatory Visit: Payer: Self-pay

## 2024-02-09 ENCOUNTER — Encounter (HOSPITAL_COMMUNITY): Payer: Self-pay

## 2024-02-09 ENCOUNTER — Inpatient Hospital Stay: Admit: 2024-02-09 | Payer: Self-pay

## 2024-02-09 ENCOUNTER — Encounter: Admitting: Internal Medicine

## 2024-02-09 DIAGNOSIS — S065XAA Traumatic subdural hemorrhage with loss of consciousness status unknown, initial encounter: Secondary | ICD-10-CM | POA: Diagnosis present

## 2024-02-09 HISTORY — PX: CRANIOTOMY: SHX93

## 2024-02-09 LAB — CBC
HCT: 33.1 % — ABNORMAL LOW (ref 36.0–46.0)
Hemoglobin: 10.9 g/dL — ABNORMAL LOW (ref 12.0–15.0)
MCH: 30.4 pg (ref 26.0–34.0)
MCHC: 32.9 g/dL (ref 30.0–36.0)
MCV: 92.2 fL (ref 80.0–100.0)
Platelets: 268 K/uL (ref 150–400)
RBC: 3.59 MIL/uL — ABNORMAL LOW (ref 3.87–5.11)
RDW: 13.7 % (ref 11.5–15.5)
WBC: 6.5 K/uL (ref 4.0–10.5)
nRBC: 0 % (ref 0.0–0.2)

## 2024-02-09 LAB — COMPREHENSIVE METABOLIC PANEL WITH GFR
ALT: 27 U/L (ref 0–44)
AST: 27 U/L (ref 15–41)
Albumin: 4.1 g/dL (ref 3.5–5.0)
Alkaline Phosphatase: 58 U/L (ref 38–126)
Anion gap: 13 (ref 5–15)
BUN: 20 mg/dL (ref 8–23)
CO2: 25 mmol/L (ref 22–32)
Calcium: 9.3 mg/dL (ref 8.9–10.3)
Chloride: 103 mmol/L (ref 98–111)
Creatinine, Ser: 0.64 mg/dL (ref 0.44–1.00)
GFR, Estimated: >60 mL/min (ref >60)
Glucose, Bld: 127 mg/dL — ABNORMAL HIGH (ref 70–99)
Potassium: 3.8 mmol/L (ref 3.5–5.1)
Sodium: 141 mmol/L (ref 135–145)
Total Bilirubin: <0.2 mg/dL (ref 0.0–1.2)
Total Protein: 6.4 g/dL — ABNORMAL LOW (ref 6.5–8.1)

## 2024-02-09 LAB — APTT: aPTT: 27 s (ref 24–36)

## 2024-02-09 LAB — PROTIME-INR
INR: 1.1 (ref 0.8–1.2)
Prothrombin Time: 14.7 s (ref 11.4–15.2)

## 2024-02-09 LAB — TROPONIN T, HIGH SENSITIVITY: Troponin T High Sensitivity: <15 ng/L (ref 0–19)

## 2024-02-09 SURGERY — CRANIOTOMY HEMATOMA EVACUATION SUBDURAL
Anesthesia: General | Laterality: Left

## 2024-02-09 MED ORDER — ACETAMINOPHEN 500 MG PO TABS: 1000.0000 mg | ORAL_TABLET | Freq: Once | ORAL | Status: DC

## 2024-02-09 MED ORDER — PROPOFOL 1000 MG/100ML IV EMUL
0.0000 ug/kg/min | INTRAVENOUS | Status: DC
  Administered 2024-02-09: 61.275 ug/kg/min via INTRAVENOUS
  Filled 2024-02-09: qty 100

## 2024-02-09 MED ORDER — PROPOFOL BOLUS VIA INFUSION: 20.0000 mg | INTRAVENOUS | Status: DC | PRN

## 2024-02-09 MED ORDER — LIDOCAINE-EPINEPHRINE (PF) 2 %-1:200000 IJ SOLN
INTRAMUSCULAR | Status: AC
  Filled 2024-02-09: qty 20

## 2024-02-09 MED ORDER — IOHEXOL 350 MG/ML SOLN
100.0000 mL | Freq: Once | INTRAVENOUS | Status: AC | PRN
  Administered 2024-02-09: 100 mL via INTRAVENOUS

## 2024-02-09 MED ORDER — MANNITOL 20 % IV SOLN
INTRAVENOUS | Status: AC
  Filled 2024-02-09: qty 500

## 2024-02-09 MED ORDER — ETOMIDATE 2 MG/ML IV SOLN
20.0000 mg | Freq: Once | INTRAVENOUS | Status: DC
  Filled 2024-02-09: qty 10

## 2024-02-09 MED ORDER — FENTANYL CITRATE (PF) 50 MCG/ML IJ SOSY
12.5000 ug | PREFILLED_SYRINGE | Freq: Once | INTRAMUSCULAR | Status: AC
  Administered 2024-02-09: 12.5 ug via INTRAVENOUS
  Filled 2024-02-09: qty 1

## 2024-02-09 MED ORDER — LIDOCAINE-EPINEPHRINE 1 %-1:100000 IJ SOLN
INTRAMUSCULAR | Status: AC
  Filled 2024-02-09: qty 1

## 2024-02-09 MED ORDER — SEVOFLURANE IN SOLN
RESPIRATORY_TRACT | Status: AC
  Filled 2024-02-09: qty 250

## 2024-02-09 MED ORDER — SUCCINYLCHOLINE CHLORIDE 200 MG/10ML IV SOSY
150.0000 mg | PREFILLED_SYRINGE | Freq: Once | INTRAVENOUS | Status: AC
  Administered 2024-02-09: 150 mg via INTRAVENOUS

## 2024-02-09 MED ORDER — ONDANSETRON HCL 4 MG/2ML IJ SOLN: 4.0000 mg | Freq: Once | INTRAMUSCULAR | Status: AC

## 2024-02-09 MED ORDER — LEVETIRACETAM (KEPPRA) 500 MG/5 ML ADULT IV PUSH
1000.0000 mg | Freq: Once | INTRAVENOUS | Status: AC
  Administered 2024-02-10: 1000 mg via INTRAVENOUS
  Filled 2024-02-09 (×2): qty 10

## 2024-02-09 MED ORDER — BACITRACIN ZINC 500 UNIT/GM EX OINT
TOPICAL_OINTMENT | CUTANEOUS | Status: AC
  Filled 2024-02-09: qty 28.35

## 2024-02-09 MED ORDER — CLEVIDIPINE BUTYRATE 0.5 MG/ML IV EMUL
0.0000 mg/h | INTRAVENOUS | Status: DC
  Administered 2024-02-09: 2 mg/h via INTRAVENOUS
  Filled 2024-02-09: qty 50

## 2024-02-09 MED ORDER — SUCCINYLCHOLINE CHLORIDE 200 MG/10ML IV SOSY
150.0000 mg | PREFILLED_SYRINGE | Freq: Once | INTRAVENOUS | Status: DC
  Filled 2024-02-09: qty 10

## 2024-02-09 MED ORDER — LIDOCAINE-EPINEPHRINE 2 %-1:100000 IJ SOLN: 20.0000 mL | Freq: Once | INTRAMUSCULAR | Status: DC

## 2024-02-09 MED ORDER — PROPOFOL 10 MG/ML IV BOLUS
INTRAVENOUS | Status: AC
  Filled 2024-02-09: qty 20

## 2024-02-09 MED ORDER — TETANUS-DIPHTH-ACELL PERTUSSIS 5-2-15.5 LF-MCG/0.5 IM SUSP
0.5000 mL | Freq: Once | INTRAMUSCULAR | Status: AC
  Administered 2024-02-09: 0.5 mL via INTRAMUSCULAR
  Filled 2024-02-09: qty 0.5

## 2024-02-09 MED ORDER — LEVETIRACETAM 500 MG/5ML IV SOLN
INTRAVENOUS | Status: AC
  Filled 2024-02-09: qty 5

## 2024-02-09 MED ORDER — PHENYLEPHRINE HCL-NACL 20-0.9 MG/250ML-% IV SOLN
INTRAVENOUS | Status: AC
  Filled 2024-02-09: qty 250

## 2024-02-09 MED ORDER — ETOMIDATE 2 MG/ML IV SOLN
10.0000 mg | Freq: Once | INTRAVENOUS | Status: AC
  Administered 2024-02-09: 10 mg via INTRAVENOUS

## 2024-02-09 MED ORDER — ONDANSETRON HCL 4 MG/2ML IJ SOLN
INTRAMUSCULAR | Status: AC
  Administered 2024-02-09: 4 mg via INTRAVENOUS
  Filled 2024-02-09: qty 2

## 2024-02-09 MED ORDER — CEFAZOLIN SODIUM-DEXTROSE 2-3 GM-%(50ML) IV SOLR
INTRAVENOUS | Status: DC | PRN
  Administered 2024-02-09: 2 g via INTRAVENOUS

## 2024-02-09 MED ORDER — FENTANYL CITRATE (PF) 100 MCG/2ML IJ SOLN
INTRAMUSCULAR | Status: AC
  Filled 2024-02-09: qty 2

## 2024-02-09 SURGICAL SUPPLY — 70 items
APPLICATOR CHLORAPREP 10.5 ORG (MISCELLANEOUS) ×2 IMPLANT
BASIN GRAD PLASTIC 32OZ STRL (MISCELLANEOUS) ×1 IMPLANT
BLADE CLIPPER SPEC (BLADE) ×1 IMPLANT
BLADE SURG 15 STRL LF DISP TIS (BLADE) ×1 IMPLANT
BNDG GAUZE DERMACEA FLUFF 4 (GAUZE/BANDAGES/DRESSINGS) IMPLANT
BUR ACORN 7.5 PRECISION (BURR) ×1 IMPLANT
BUR SPIRAL ROUTER 2.3 (BUR) ×1 IMPLANT
CLIP RANEY DISP (INSTRUMENTS) ×1 IMPLANT
CNTNR URN SCR LID CUP LEK RST (MISCELLANEOUS) ×3 IMPLANT
DRAIN CHANNEL JP 10F RND 20C F (MISCELLANEOUS) IMPLANT
DRAIN WOUND RND W/TROCAR (DRAIN) IMPLANT
DRAPE INCISE 23X17 STRL (DRAPES) ×2 IMPLANT
DRAPE INCISE IOBAN 66X45 STRL (DRAPES) ×1 IMPLANT
DRAPE SURG 17X11 SM STRL (DRAPES) ×4 IMPLANT
DRAPE WARM FLUID 44X44 (DRAPES) ×1 IMPLANT
DRSG TEGADERM 4X4.75 (GAUZE/BANDAGES/DRESSINGS) ×1 IMPLANT
DRSG TELFA 3X8 NADH STRL (GAUZE/BANDAGES/DRESSINGS) IMPLANT
ELECTRODE REM PT RTRN 9FT ADLT (ELECTROSURGICAL) ×1 IMPLANT
EVACUATOR SILICONE 100CC (DRAIN) IMPLANT
GAUZE 4X4 16PLY ~~LOC~~+RFID DBL (SPONGE) ×3 IMPLANT
GAUZE XEROFORM 1X8 LF (GAUZE/BANDAGES/DRESSINGS) ×1 IMPLANT
GLOVE BIOGEL PI IND STRL 6.5 (GLOVE) IMPLANT
GLOVE BIOGEL PI IND STRL 7.0 (GLOVE) ×1 IMPLANT
GLOVE BIOGEL PI IND STRL 8 (GLOVE) ×1 IMPLANT
GLOVE BIOGEL PI IND STRL 8.5 (GLOVE) ×2 IMPLANT
GLOVE SURG SYN 6.5 PF PI (GLOVE) IMPLANT
GLOVE SURG SYN 7.0 PF PI (GLOVE) ×1 IMPLANT
GLOVE SURG SYN 7.5 PF PI (GLOVE) ×4 IMPLANT
GOWN SRG LRG LVL 4 IMPRV REINF (GOWNS) ×1 IMPLANT
GOWN SRG XL LVL 3 NONREINFORCE (GOWNS) ×1 IMPLANT
GRAFT DURAGEN MATRIX 3X3 SNGL (Graft) IMPLANT
HEMOSTAT SURGICEL 2X14 (HEMOSTASIS) IMPLANT
HEMOSTAT SURGICEL 2X3 (HEMOSTASIS) IMPLANT
HEMOSTAT SURGICEL 4X8 (HEMOSTASIS) IMPLANT
HOLDER FOLEY CATH W/STRAP (MISCELLANEOUS) ×1 IMPLANT
HOOK STAY BLUNT/RETRACTOR 5M (MISCELLANEOUS) ×1 IMPLANT
KIT TURNOVER KIT A (KITS) ×1 IMPLANT
MANIFOLD NEPTUNE II (INSTRUMENTS) ×1 IMPLANT
MAT ABSORB FLUID 56X50 GRAY (MISCELLANEOUS) ×1 IMPLANT
PACK LAMINECTOMY ARMC (PACKS) ×1 IMPLANT
PAD ARMBOARD POSITIONER FOAM (MISCELLANEOUS) ×2 IMPLANT
PATTIES SURGICAL .5 X3 (DISPOSABLE) ×1 IMPLANT
PERFORATOR LRG 14-11MM (BIT) IMPLANT
PIN MAYFIELD SKULL DISP (PIN) IMPLANT
PLATE 1.5/0.5 18.5MM BURR HOLE (Plate) IMPLANT
PLATE 1.5/0.5 8HOLE SM SUB (Plate) IMPLANT
PLATE BUR HOLE SM PRE-BENT 13 (Plate) IMPLANT
SCREW SELF DRILL HT 1.5/4MM (Screw) IMPLANT
SET CATH VENT DRAIN 3-15 1.9D (DRAIN) IMPLANT
SHEET NEURO XL SOL CTL (MISCELLANEOUS) ×1 IMPLANT
SLEEVE SCD COMPRESS KNEE MED (STOCKING) IMPLANT
SOLN STERILE WATER 500 ML (IV SOLUTION) ×1 IMPLANT
SOLN STERILE WATER BTL 1000 ML (IV SOLUTION) ×3 IMPLANT
SOLUTION PREP PVP 2OZ (MISCELLANEOUS) ×1 IMPLANT
SOLUTION SCRB POV-IOD 4OZ 7.5% (MISCELLANEOUS) ×1 IMPLANT
SPIKE FLUID TRANSFER (MISCELLANEOUS) IMPLANT
SPONGE NEURO XRAY DETECT 1X3 (DISPOSABLE) ×1 IMPLANT
STAPLER SKIN PROX 35W (STAPLE) ×2 IMPLANT
SURGIFLO W/THROMBIN 8M KIT (HEMOSTASIS) ×2 IMPLANT
SURGILUBE 2OZ TUBE FLIPTOP (MISCELLANEOUS) ×1 IMPLANT
SUT ETHILON 3-0 (SUTURE) IMPLANT
SUT NURALON 4 0 TR CR/8 (SUTURE) ×1 IMPLANT
SUT VIC AB 0 CT1 36 (SUTURE) IMPLANT
SUT VIC AB 2-0 CT1 18 (SUTURE) ×2 IMPLANT
SYR 10ML LL (SYRINGE) ×1 IMPLANT
SYR 20ML LL LF (SYRINGE) ×2 IMPLANT
TAPE CLOTH 3X10 WHT NS LF (GAUZE/BANDAGES/DRESSINGS) ×1 IMPLANT
TRAP FLUID SMOKE EVACUATOR (MISCELLANEOUS) ×1 IMPLANT
TRAY FOLEY SLVR 16FR TEMP STAT (SET/KITS/TRAYS/PACK) ×1 IMPLANT
TUBING CONNECTING 10 (TUBING) IMPLANT

## 2024-02-09 NOTE — ED Notes (Signed)
Patient to CT with this RN on monitor

## 2024-02-09 NOTE — ED Notes (Addendum)
 B.Smith, MD at bedside with pts spouse showing new CT results and discussing decision for intervention. Spouse on phone with daughter and both agree to proceed with surgical intervention of emergenct craniotomy.

## 2024-02-09 NOTE — ED Notes (Addendum)
 Inquired about c-collar provider deferred at this time

## 2024-02-09 NOTE — H&P (Signed)
 Consulting Department:  ***  Primary Physician:  Geofm Glade PARAS, MD  Chief Complaint:  ***  History of Present Illness: 02/09/2024 [name] is a 78 y.o. female who presents with the chief complaint of ***  Duration: *** Location: *** Quality: *** Provoking: aggravated by *** Alleviating: made better by *** Weakness: ***none Timing: ***none CSF Leak:   Review of Systems:  A 10 point review of systems is negative, except for the pertinent positives and negatives detailed in the HPI.  Past Medical History: Past Medical History:  Diagnosis Date   Allergy     Anemia    Angina pectoris    Anxiety    Aortic atherosclerosis    Asthma    Bilateral carotid artery disease    Bradycardia    CAD (coronary artery disease)    Chronic shortness of breath    Complication of anesthesia    a.) PONV   Depression    Diastolic dysfunction    Diverticulosis    Duodenal diverticulum    Family history of adverse reaction to anesthesia    a.) PONV in first degree female relatives (mother and daughters)   Fibromyalgia    Gastroesophageal reflux disease with hiatal hernia    Hiatal hernia    History of bilateral cataract extraction    History of kidney stones    Hyperlipidemia    IBS (irritable bowel syndrome)    Insomnia    a.) takes trazodone  as needed   Left inguinal hernia    Long term current use of clopidogrel     Long-term use of aspirin  therapy    Lymphocytic colitis    Migraine headache    Mitral valve prolapse    Neuropathy    Orthostatic hypotension    OSA (obstructive sleep apnea)    a.) non-compliant with prescribed nocturnal PAP therapy; unable to tolerate mask per report   Osteoporosis    Palpitations    Peripheral neuropathy    PONV (postoperative nausea and vomiting)    PSVT (paroxysmal supraventricular tachycardia)    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    RLS (restless legs syndrome)    Schatzki's ring    a.) s/p  esophageal dilitation procedures   Spondylosis    Superior mesenteric artery stenosis     Past Surgical History: Past Surgical History:  Procedure Laterality Date   ANAL RECTAL MANOMETRY N/A 11/14/2015   Procedure: ANO RECTAL MANOMETRY;  Surgeon: Lupita FORBES Commander, MD;  Location: [facility];  Service: Endoscopy;  Laterality: N/A;   BREAST ENHANCEMENT SURGERY     CATARACT EXTRACTION Bilateral    CHOLECYSTECTOMY     COLONOSCOPY     ESOPHAGEAL DILATION     X 2   HERNIORRHAPHY, INGUINAL, ROBOT-ASSISTED, LAPAROSCOPIC Left 01/26/2024   Procedure: HERNIORRHAPHY, INGUINAL, ROBOT-ASSISTED, LAPAROSCOPIC;  Surgeon: Desiderio Schanz, MD;  Location: [facility];  Service: General;  Laterality: Left;   INSERTION OF MESH  01/26/2024   Procedure: INSERTION OF MESH;  Surgeon: Desiderio Schanz, MD;  Location: [facility];  Service: General;;   RENAL INTERVENTION N/A 10/07/2023   Procedure: RENAL INTERVENTION;  Surgeon: Marea Selinda RAMAN, MD;  Location: [facility];  Service: Cardiovascular;  Laterality: N/A;   ROTATOR CUFF REPAIR Left    SINUS ENDO W/FUSION N/A 05/30/2014   Procedure: REVISION  FRONTAL SINUS SURGERY WITH FUSION SCAN;  Surgeon: Alm Bouche, MD;  Location: [facility];  Service: ENT;  Laterality: N/A;   SINUS SURGERY WITH INSTATRAK  x 2   TUBAL LIGATION     UPPER GASTROINTESTINAL ENDOSCOPY     Vaginal cystectomy     x 2   VAGINAL HYSTERECTOMY  05/2007   Vaginal repair, Dr Cary.  Partial  hysterectomy.   VISCERAL ANGIOGRAPHY N/A 10/07/2023   Procedure: VISCERAL ANGIOGRAPHY;  Surgeon: Marea Selinda RAMAN, MD;  Location: [facility];  Service: Cardiovascular;  Laterality: N/A;   WRIST ARTHROSCOPY      Allergies: Allergies as of 02/09/2024 - Review Complete 02/09/2024  Allergen Reaction Noted   Tape Rash 08/04/2011   Albuterol   04/14/2016   Latex Other (See Comments) 01/09/2020   Lyrica  [pregabalin ] Other (See Comments) 12/14/2012   Neurontin  [gabapentin ] Other (See Comments)  12/14/2012   Nitrofuran derivatives Other (See Comments) 10/11/2012   Paxil [paroxetine hcl]  08/15/2015   Prozac  [fluoxetine  hcl] Other (See Comments) 08/16/2015   Cymbalta  [duloxetine  hcl] Nausea Only and Anxiety 08/15/2015    Medications:  Current Facility-Administered Medications:    acetaminophen  (TYLENOL ) tablet 1,000 mg, 1,000 mg, Oral, Once, Ernest Ronal BRAVO, MD   clevidipine  (CLEVIPREX ) infusion 0.5 mg/mL, 0-21 mg/hr, Intravenous, Continuous, Ernest Ronal BRAVO, MD   levETIRAcetam  (KEPPRA ) undiluted injection 1,000 mg, 1,000 mg, Intravenous, Once, Ernest Ronal BRAVO, MD   lidocaine -EPINEPHrine  (XYLOCAINE  W/EPI) 2 %-1:100000 (with pres) injection 20 mL, 20 mL, Other, Once, Ernest Ronal BRAVO, MD   lidocaine -EPINEPHrine  (XYLOCAINE  W/EPI) 2 %-1:200000 (PF) injection, , , ,    propofol  (DIPRIVAN ) 1000 MG/100ML infusion, 0-80 mcg/kg/min, Intravenous, Continuous, Ernest Ronal BRAVO, MD, Last Rate: 20 mL/hr at 02/09/24 2251, 61.275 mcg/kg/min at 02/09/24 2251   propofol  (DIPRIVAN ) bolus via infusion 20 mg, 20 mg, Intravenous, Q1H PRN, Ernest Ronal BRAVO, MD   succinylcholine  (ANECTINE ) syringe 150 mg, 150 mg, Intravenous, Once, Ernest Ronal BRAVO, MD  Current Outpatient Medications:    acetaminophen  (TYLENOL ) 500 MG tablet, Take 2 tablets (1,000 mg total) by mouth every 6 (six) hours as needed for mild pain (pain score 1-3)., Disp: , Rfl:    aspirin  EC 81 MG tablet, Take 1 tablet (81 mg total) by mouth daily. Swallow whole., Disp: 150 tablet, Rfl: 2   Azelastine -Fluticasone  137-50 MCG/ACT SUSP, PLACE 1 SPRAY INTO THE NOSE EVERY 12 (TWELVE) HOURS. (Patient taking differently: Place 1 spray into both nostrils daily as needed (allergies).), Disp: 23 g, Rfl: 8   budeson-glycopyrrolate -formoterol  (BREZTRI  AEROSPHERE) 160-9-4.8 MCG/ACT AERO, Inhale 2 puffs into the lungs 2 (two) times daily., Disp: 10.7 g, Rfl: 11   clopidogrel  (PLAVIX ) 75 MG tablet, Take 1 tablet (75 mg total) by mouth daily., Disp: 30 tablet, Rfl: 11    Cyanocobalamin  (VITAMIN B-12 PO), Take 1 tablet by mouth daily., Disp: , Rfl:    famotidine  (PEPCID ) 40 MG tablet, TAKE 1 TABLET (40 MG TOTAL) BY MOUTH 2 (TWO) TIMES DAILY AS NEEDED FOR HEARTBURN OR INDIGESTION., Disp: 180 tablet, Rfl: 1   fluticasone -salmeterol (WIXELA INHUB) 100-50 MCG/ACT AEPB, Inhale 1 puff into the lungs 2 (two) times daily., Disp: , Rfl:    hydrOXYzine (ATARAX) 25 MG tablet, Take 25 mg by mouth daily., Disp: , Rfl:    levalbuterol  (XOPENEX  HFA) 45 MCG/ACT inhaler, Inhale 1-2 puffs into the lungs every 4 (four) hours as needed for wheezing., Disp: 1 each, Rfl: 12   midodrine  (PROAMATINE ) 10 MG tablet, TAKE 1 TABLET BY MOUTH TWICE A DAY, Disp: 60 tablet, Rfl: 2   ondansetron  (ZOFRAN -ODT) 4 MG disintegrating tablet, Take 1 tablet (4 mg total) by mouth every 8 (eight) hours as needed  for nausea or vomiting., Disp: 20 tablet, Rfl: 0   OVER THE COUNTER MEDICATION, Bone Up 1000 mg 2 tabs TID, Disp: , Rfl:    OVER THE COUNTER MEDICATION, B-Right (B-Complex) 1 po daily, Disp: , Rfl:    oxyCODONE  (OXY IR/ROXICODONE ) 5 MG immediate release tablet, Take 1 tablet (5 mg total) by mouth every 4 (four) hours as needed for severe pain (pain score 7-10)., Disp: 30 tablet, Rfl: 0   pantoprazole  (PROTONIX ) 40 MG tablet, Take 40 mg by mouth daily., Disp: , Rfl:    PARoxetine (PAXIL) 20 MG tablet, Take 20 mg by mouth at bedtime., Disp: , Rfl:    potassium chloride  (KLOR-CON ) 20 MEQ packet, DISSOLVE CONTENTS OF 1 PACKET DAILY, Disp: 90 packet, Rfl: 1   Probiotic Product (ALIGN) 4 MG CAPS, Take 1 capsule by mouth daily., Disp: , Rfl:    RESTASIS 0.05 % ophthalmic emulsion, Place 1 drop into both eyes 2 (two) times daily., Disp: , Rfl:    rosuvastatin  (CRESTOR ) 5 MG tablet, TAKE 1 TABLET (5 MG TOTAL) BY MOUTH DAILY FOR CHOLESTEROL, Disp: 90 tablet, Rfl: 3   traZODone  (DESYREL ) 100 MG tablet, Take 100 mg by mouth at bedtime., Disp: , Rfl:    traZODone  (DESYREL ) 50 MG tablet, Take 1 tablet (50 mg total)  by mouth at bedtime., Disp: 30 tablet, Rfl: 0   vitamin C (ASCORBIC ACID) 500 MG tablet, Take 500 mg by mouth daily., Disp: , Rfl:    vitamin E 180 MG (400 UNITS) capsule, Take 400 Units by mouth daily., Disp: , Rfl:    Social History: Social History   Tobacco Use   Smoking status: Former    Current packs/day: 0.00    Average packs/day: 0.5 packs/day for 15.0 years (7.5 ttl pk-yrs)    Types: Cigarettes    Start date: 03/04/1983    Quit date: 03/03/1998    Years since quitting: 25.9    Passive exposure: Past   Smokeless tobacco: Never   Tobacco comments:    smoked 1973- 2006, up to <  1  ppd  Vaping Use   Vaping status: Never Used  Substance Use Topics   Alcohol use: No    Alcohol/week: 0.0 standard drinks of alcohol   Drug use: No    Family Medical History: Family History  Problem Relation Age of Onset   Hypertension Father 62   Stroke Father 16   Heart attack Father         ? in 80s   Hypertension Mother    Neuropathy Mother    Anemia Mother    Coronary artery disease Brother        Stent placement in 49s   Diabetes Maternal Uncle    Heart attack Paternal Uncle        SEVEN , ? age   Colon cancer Neg Hx    Seizures Neg Hx    Esophageal cancer Neg Hx    Rectal cancer Neg Hx    Stomach cancer Neg Hx     Physical Examination: Vitals:   02/09/24 2147 02/09/24 2159  BP: (!) 153/127 (!) 165/93  Pulse: (!) 49 69  Resp: 17 19  Temp:  98.4 F (36.9 C)  SpO2: 97% 97%     General: Patient is well developed, well nourished, calm, collected, and in no apparent distress.  NEUROLOGICAL:  General: In no acute distress.   Awake, alert, oriented to person, place, and time.  Pupils equal round and reactive to light.  Full Facial tone is symmetric.  Tongue protrusion is midline.  Bilateral upper extremities are full strength proximally and distally.  There is no pronator drift.  Language is conversant.  GCS:***   Bilateral upper and lower extremity sensation is intact  to light touch.  Imaging: DG Chest Portable 1 View Result Date: 02/09/2024 EXAM: 1 VIEW(S) XRAY OF THE CHEST 02/09/2024 10:52:00 PM COMPARISON: Earlier today. CLINICAL HISTORY: intubation FINDINGS: LINES, TUBES AND DEVICES: Endotracheal tube terminates 11 mm above the carina; consider withdrawal 1 to 2 cm. No enteric tube is visualized despite the clinical history. LUNGS AND PLEURA: No focal pulmonary opacity. No pleural effusion. No pneumothorax. HEART AND MEDIASTINUM: No acute abnormality of the cardiac and mediastinal silhouettes. BONES AND SOFT TISSUES: No acute osseous abnormality. IMPRESSION: 1. Endotracheal tube terminates 11 mm above the carina, recommend withdrawing 1 to 2 cm. 2. No enteric tube visualized. Electronically signed by: Pinkie Pebbles MD 02/09/2024 10:54 PM EST RP Workstation: [id number]   CT ANGIO HEAD NECK W WO CM Addendum Date: 02/09/2024 ******** ADDENDUM #1 ******** ADDENDUM: Findings discussed with Dr. Ernest via telephone at 9:05 PM. ---------------------------------------------------- Electronically signed by: Gilmore Molt MD 02/09/2024 09:19 PM EST RP Workstation: [id number]   Result Date: 02/09/2024 ******** ORIGINAL REPORT ******** EXAM: CTA HEAD AND NECK WITH AND WITHOUT 02/09/2024 08:23:12 PM TECHNIQUE: CTA of the head and neck was performed with and without the administration of intravenous contrast. Multiplanar 2D and/or 3D reformatted images are provided for review. Automated exposure control, iterative reconstruction, and/or weight based adjustment of the mA/kV was utilized to reduce the radiation dose to as low as reasonably achievable. Stenosis of the internal carotid arteries measured using NASCET criteria. COMPARISON: None available CLINICAL HISTORY: Neuro deficit, acute, stroke suspected FINDINGS: AORTIC ARCH AND ARCH VESSELS: No dissection or arterial injury. No significant stenosis of the brachiocephalic or subclavian arteries. CERVICAL CAROTID ARTERIES: No  dissection, arterial injury, or hemodynamically significant stenosis by NASCET criteria. CERVICAL VERTEBRAL ARTERIES: No dissection, arterial injury, or significant stenosis. LUNGS AND MEDIASTINUM: Unremarkable. SOFT TISSUES: No acute abnormality. BONES: No acute abnormality. ANTERIOR CIRCULATION: No significant stenosis of the internal carotid arteries. No significant stenosis of the anterior cerebral arteries. No significant stenosis of the middle cerebral arteries. Blind ending vessel in the left subdural hemorrhage (for example see series 4, image 102-104) likely is a vein, possibly injured. Small 1 mm inferiorly directed outpouching arising from the supraclinoid left ICA (series 4 image 106; series 6, image 87; and series 5, image 98). POSTERIOR CIRCULATION: No significant stenosis of the posterior cerebral arteries. No significant stenosis of the basilar artery. No significant stenosis of the vertebral arteries. No aneurysm. OTHER: No dural venous sinus thrombosis on this non-dedicated study. Findings discussed with Dr. Ernest via teelphone at IMPRESSION: 1. Small (1 mm) inferiorly directed outpouching arising from the supraclinoid left ICA, compatible with infundibulum or aneurysm. A catheter arteriogram could further evaluate if clinically warranted. 2. A blind ending vessel in the left subdural hemorrhage likely is a vein that may be injured. 3. No large vessel occlusion or significant stenosis. Electronically signed by: Gilmore Molt MD 02/09/2024 09:10 PM EST RP Workstation: [id number]   CT Head Wo Contrast Result Date: 02/09/2024 CLINICAL DATA:  Facial trauma syncopal episode fell on pavement and hit right-side of head EXAM: CT HEAD WITHOUT CONTRAST CT MAXILLOFACIAL WITHOUT CONTRAST CT CERVICAL SPINE WITHOUT CONTRAST TECHNIQUE: Multidetector CT imaging of the head, cervical spine, and maxillofacial structures were performed using the standard protocol without intravenous contrast. Multiplanar  CT  image reconstructions of the cervical spine and maxillofacial structures were also generated. RADIATION DOSE REDUCTION: This exam was performed according to the departmental dose-optimization program which includes automated exposure control, adjustment of the mA and/or kV according to patient size and/or use of iterative reconstruction technique. COMPARISON:  MRI 10/12/2023, CT brain 01/04/2024 FINDINGS: CT HEAD FINDINGS Brain: Negative for intracranial mass. Fairly extensive left greater than right subarachnoid hemorrhage. Acute bilateral subdural hematomas, on the left measuring up to 7 mm in thickness at the left parietal convexity. Trace 3 mm right anterior convexity subdural hematoma. Approximate 4 mm midline shift to the right. The ventricles are non enlarged. Isolated focus of gas within the inferior left frontal region, series 2, image 10, sagittal series 5 image 31. Vascular: No unexpected calcification Skull: Fate linear hypodensity in the right temporal and parietal bone, series 3, image 23 through 36, not clearly seen on the prior, difficult to exclude subtle nondisplaced skull fracture. Other: Large right scalp hematoma. Traumatic Brain Injury Risk Stratification Skull Fracture: Nondisplaced - Moderate/mBIG 2 Subdural Hematoma (SDH): 4mm to <72mm - mBIG 2 Subarachnoid Hemorrhage [facility]): multifocal, bilateral - High/mBIG 3 Epidural Hematoma (EDH): No - Low/mBIG 1 Cerebral contusion, intra-axial, intraparenchymal Hemorrhage (IPH): No Intraventricular Hemorrhage (IVH): No - Low/mBIG 1 Midline Shift > 1mm or Edema/effacement of sulci/vents: Yes - High/mBIG 3 ---------------------------------------------------- CT MAXILLOFACIAL FINDINGS Osseous: Mastoid air cells are clear. Mandibular heads are normally position. No mandibular fracture. Pterygoid plates and zygomatic arches are intact. No acute nasal bone fracture Orbits: Negative. No traumatic or inflammatory finding. Sinuses: Postsurgical changes of the  maxillary and ethmoid sinuses. Mild mucosal thickening. Hyperdense secretion in the right sphenoid sinus with fluid level. Trace fluid level left sphenoid sinus. Some gas around the right cavernous sinus. Soft tissues: Partially visualized right scalp hematoma. CT CERVICAL SPINE FINDINGS Alignment: Trace anterolisthesis C4 on C5. Facet alignment is normal. Skull base and vertebrae: No acute fracture. No primary bone lesion or focal pathologic process. Soft tissues and spinal canal: No prevertebral fluid or swelling. No visible canal hematoma. Disc levels: Moderate advanced disc space narrowing C5-C6 and C6-C7. Multilevel facet degenerative change with foraminal narrowing Upper chest: Apical scarring IMPRESSION: 1. Fairly extensive left greater than right subarachnoid hemorrhage. Acute bilateral subdural hematomas, left greater than right. Hematoma on the left measures up to 7 mm maximum thickness and there is approximately 4 mm midline shift to the right. 2. Large right scalp hematoma. Faint linear hypodensity in the right temporal and parietal bone, not clearly seen on the prior, and suspect for subtle nondisplaced skull fracture. 3. Few punctate foci of intracranial gas along the inferior frontal lobes. Irregular appearance of the right sphenoid sinus with hyperdense fluid within and fluid level, concerning for hemorrhagic material. Some gas at the cavernous sinus and right greater than left central skull base, raising concern for potential sphenoid sinus/central skull base fracture, this could potentially account for the small amount of intracranial gas. CT angiography recommended to exclude vascular injury to the carotid artery. 4. Degenerative changes of the cervical spine. No acute osseous abnormality. Critical Value/emergent results were called by telephone at the time of interpretation on 02/09/2024 at 8:02 pm to provider [facility] , who verbally acknowledged these results. Electronically Signed   By: Luke Bun M.D.   On: 02/09/2024 20:45   CT Cervical Spine Wo Contrast Result Date: 02/09/2024 CLINICAL DATA:  Facial trauma syncopal episode fell on pavement and hit right-side of head EXAM: CT HEAD WITHOUT  CONTRAST CT MAXILLOFACIAL WITHOUT CONTRAST CT CERVICAL SPINE WITHOUT CONTRAST TECHNIQUE: Multidetector CT imaging of the head, cervical spine, and maxillofacial structures were performed using the standard protocol without intravenous contrast. Multiplanar CT image reconstructions of the cervical spine and maxillofacial structures were also generated. RADIATION DOSE REDUCTION: This exam was performed according to the departmental dose-optimization program which includes automated exposure control, adjustment of the mA and/or kV according to patient size and/or use of iterative reconstruction technique. COMPARISON:  MRI 10/12/2023, CT brain 01/04/2024 FINDINGS: CT HEAD FINDINGS Brain: Negative for intracranial mass. Fairly extensive left greater than right subarachnoid hemorrhage. Acute bilateral subdural hematomas, on the left measuring up to 7 mm in thickness at the left parietal convexity. Trace 3 mm right anterior convexity subdural hematoma. Approximate 4 mm midline shift to the right. The ventricles are non enlarged. Isolated focus of gas within the inferior left frontal region, series 2, image 10, sagittal series 5 image 31. Vascular: No unexpected calcification Skull: Fate linear hypodensity in the right temporal and parietal bone, series 3, image 23 through 36, not clearly seen on the prior, difficult to exclude subtle nondisplaced skull fracture. Other: Large right scalp hematoma. Traumatic Brain Injury Risk Stratification Skull Fracture: Nondisplaced - Moderate/mBIG 2 Subdural Hematoma (SDH): 4mm to <33mm - mBIG 2 Subarachnoid Hemorrhage [facility]): multifocal, bilateral - High/mBIG 3 Epidural Hematoma (EDH): No - Low/mBIG 1 Cerebral contusion, intra-axial, intraparenchymal Hemorrhage (IPH): No  Intraventricular Hemorrhage (IVH): No - Low/mBIG 1 Midline Shift > 1mm or Edema/effacement of sulci/vents: Yes - High/mBIG 3 ---------------------------------------------------- CT MAXILLOFACIAL FINDINGS Osseous: Mastoid air cells are clear. Mandibular heads are normally position. No mandibular fracture. Pterygoid plates and zygomatic arches are intact. No acute nasal bone fracture Orbits: Negative. No traumatic or inflammatory finding. Sinuses: Postsurgical changes of the maxillary and ethmoid sinuses. Mild mucosal thickening. Hyperdense secretion in the right sphenoid sinus with fluid level. Trace fluid level left sphenoid sinus. Some gas around the right cavernous sinus. Soft tissues: Partially visualized right scalp hematoma. CT CERVICAL SPINE FINDINGS Alignment: Trace anterolisthesis C4 on C5. Facet alignment is normal. Skull base and vertebrae: No acute fracture. No primary bone lesion or focal pathologic process. Soft tissues and spinal canal: No prevertebral fluid or swelling. No visible canal hematoma. Disc levels: Moderate advanced disc space narrowing C5-C6 and C6-C7. Multilevel facet degenerative change with foraminal narrowing Upper chest: Apical scarring IMPRESSION: 1. Fairly extensive left greater than right subarachnoid hemorrhage. Acute bilateral subdural hematomas, left greater than right. Hematoma on the left measures up to 7 mm maximum thickness and there is approximately 4 mm midline shift to the right. 2. Large right scalp hematoma. Faint linear hypodensity in the right temporal and parietal bone, not clearly seen on the prior, and suspect for subtle nondisplaced skull fracture. 3. Few punctate foci of intracranial gas along the inferior frontal lobes. Irregular appearance of the right sphenoid sinus with hyperdense fluid within and fluid level, concerning for hemorrhagic material. Some gas at the cavernous sinus and right greater than left central skull base, raising concern for potential  sphenoid sinus/central skull base fracture, this could potentially account for the small amount of intracranial gas. CT angiography recommended to exclude vascular injury to the carotid artery. 4. Degenerative changes of the cervical spine. No acute osseous abnormality. Critical Value/emergent results were called by telephone at the time of interpretation on 02/09/2024 at 8:02 pm to provider [facility] , who verbally acknowledged these results. Electronically Signed   By: Luke Bun M.D.   On:  02/09/2024 20:45   CT Maxillofacial Wo Contrast Result Date: 02/09/2024 CLINICAL DATA:  Facial trauma syncopal episode fell on pavement and hit right-side of head EXAM: CT HEAD WITHOUT CONTRAST CT MAXILLOFACIAL WITHOUT CONTRAST CT CERVICAL SPINE WITHOUT CONTRAST TECHNIQUE: Multidetector CT imaging of the head, cervical spine, and maxillofacial structures were performed using the standard protocol without intravenous contrast. Multiplanar CT image reconstructions of the cervical spine and maxillofacial structures were also generated. RADIATION DOSE REDUCTION: This exam was performed according to the departmental dose-optimization program which includes automated exposure control, adjustment of the mA and/or kV according to patient size and/or use of iterative reconstruction technique. COMPARISON:  MRI 10/12/2023, CT brain 01/04/2024 FINDINGS: CT HEAD FINDINGS Brain: Negative for intracranial mass. Fairly extensive left greater than right subarachnoid hemorrhage. Acute bilateral subdural hematomas, on the left measuring up to 7 mm in thickness at the left parietal convexity. Trace 3 mm right anterior convexity subdural hematoma. Approximate 4 mm midline shift to the right. The ventricles are non enlarged. Isolated focus of gas within the inferior left frontal region, series 2, image 10, sagittal series 5 image 31. Vascular: No unexpected calcification Skull: Fate linear hypodensity in the right temporal and parietal bone,  series 3, image 23 through 36, not clearly seen on the prior, difficult to exclude subtle nondisplaced skull fracture. Other: Large right scalp hematoma. Traumatic Brain Injury Risk Stratification Skull Fracture: Nondisplaced - Moderate/mBIG 2 Subdural Hematoma (SDH): 4mm to <16mm - mBIG 2 Subarachnoid Hemorrhage [facility]): multifocal, bilateral - High/mBIG 3 Epidural Hematoma (EDH): No - Low/mBIG 1 Cerebral contusion, intra-axial, intraparenchymal Hemorrhage (IPH): No Intraventricular Hemorrhage (IVH): No - Low/mBIG 1 Midline Shift > 1mm or Edema/effacement of sulci/vents: Yes - High/mBIG 3 ---------------------------------------------------- CT MAXILLOFACIAL FINDINGS Osseous: Mastoid air cells are clear. Mandibular heads are normally position. No mandibular fracture. Pterygoid plates and zygomatic arches are intact. No acute nasal bone fracture Orbits: Negative. No traumatic or inflammatory finding. Sinuses: Postsurgical changes of the maxillary and ethmoid sinuses. Mild mucosal thickening. Hyperdense secretion in the right sphenoid sinus with fluid level. Trace fluid level left sphenoid sinus. Some gas around the right cavernous sinus. Soft tissues: Partially visualized right scalp hematoma. CT CERVICAL SPINE FINDINGS Alignment: Trace anterolisthesis C4 on C5. Facet alignment is normal. Skull base and vertebrae: No acute fracture. No primary bone lesion or focal pathologic process. Soft tissues and spinal canal: No prevertebral fluid or swelling. No visible canal hematoma. Disc levels: Moderate advanced disc space narrowing C5-C6 and C6-C7. Multilevel facet degenerative change with foraminal narrowing Upper chest: Apical scarring IMPRESSION: 1. Fairly extensive left greater than right subarachnoid hemorrhage. Acute bilateral subdural hematomas, left greater than right. Hematoma on the left measures up to 7 mm maximum thickness and there is approximately 4 mm midline shift to the right. 2. Large right scalp hematoma.  Faint linear hypodensity in the right temporal and parietal bone, not clearly seen on the prior, and suspect for subtle nondisplaced skull fracture. 3. Few punctate foci of intracranial gas along the inferior frontal lobes. Irregular appearance of the right sphenoid sinus with hyperdense fluid within and fluid level, concerning for hemorrhagic material. Some gas at the cavernous sinus and right greater than left central skull base, raising concern for potential sphenoid sinus/central skull base fracture, this could potentially account for the small amount of intracranial gas. CT angiography recommended to exclude vascular injury to the carotid artery. 4. Degenerative changes of the cervical spine. No acute osseous abnormality. Critical Value/emergent results were called by telephone at the  time of interpretation on 02/09/2024 at 8:02 pm to provider [facility] , who verbally acknowledged these results. Electronically Signed   By: Luke Bun M.D.   On: 02/09/2024 20:45   CT CHEST ABDOMEN PELVIS W CONTRAST Result Date: 02/09/2024 CLINICAL DATA:  Syncope, fell EXAM: CT CHEST, ABDOMEN, AND PELVIS WITH CONTRAST TECHNIQUE: Multidetector CT imaging of the chest, abdomen and pelvis was performed following the standard protocol during bolus administration of intravenous contrast. RADIATION DOSE REDUCTION: This exam was performed according to the departmental dose-optimization program which includes automated exposure control, adjustment of the mA and/or kV according to patient size and/or use of iterative reconstruction technique. CONTRAST:  OMNIPAQUE  IOHEXOL  350 MG/ML SOLN COMPARISON:  09/24/2023, 02/09/2024 FINDINGS: CT CHEST FINDINGS Cardiovascular: Mild cardiomegaly, with trace pericardial effusion. No evidence of thoracic aortic aneurysm or dissection. Atherosclerosis of the aorta and coronary vasculature. Mediastinum/Nodes: No enlarged mediastinal, hilar, or axillary lymph nodes. Thyroid  gland, trachea, and  esophagus demonstrate no significant findings. Lungs/Pleura: No acute airspace disease, effusion, or pneumothorax. Central airways are patent. Musculoskeletal: No acute or destructive bony abnormalities. Reconstructed images demonstrate no additional findings. CT ABDOMEN PELVIS FINDINGS Hepatobiliary: No focal liver abnormality is seen. Status post cholecystectomy. No biliary dilatation. Pancreas: Unremarkable. No pancreatic ductal dilatation or surrounding inflammatory changes. Spleen: Normal in size without focal abnormality. Adrenals/Urinary Tract: Adrenal glands are unremarkable. Kidneys are normal, without renal calculi, focal lesion, or hydronephrosis. Bladder is unremarkable. Stomach/Bowel: No bowel obstruction or ileus. Normal appendix right lower quadrant. Distal colonic diverticulosis without diverticulitis. Moderate retained stool throughout the colon. No bowel wall thickening or inflammatory change. Vascular/Lymphatic: Aortic atherosclerosis. No enlarged abdominal or pelvic lymph nodes. Reproductive: Status post hysterectomy. No adnexal masses. Other: No free fluid or free intraperitoneal gas. No abdominal wall hernia. Musculoskeletal: Previous left hip ORIF. No acute or destructive bony abnormalities. Reconstructed images demonstrate no additional findings. IMPRESSION: 1. No acute intrathoracic, intra-abdominal, or intrapelvic trauma. 2. Cardiomegaly, with trace pericardial effusion. 3. Colonic diverticulosis without diverticulitis. 4. Moderate fecal retention throughout the colon. No bowel obstruction or ileus. 5.  Aortic Atherosclerosis (ICD10-I70.0). Electronically Signed   By: Ozell Daring M.D.   On: 02/09/2024 20:31   DG Pelvis Portable Result Date: 02/09/2024 EXAM: 1 VIEW(S) XRAY OF THE PELVIS 02/09/2024 08:03:38 PM COMPARISON: 08/24/2023 CLINICAL HISTORY: fall FINDINGS: BONES AND JOINTS: Status post ORIF of a left hip fracture. Mild irregularity of the greater trochanter favors sequela of  prior/chronic injury. No malalignment. SOFT TISSUES: The soft tissues are unremarkable. IMPRESSION: 1. Status post ORIF of a left hip fracture. 2. Mild irregularity of the greater trochanter, likely sequela of prior/chronic injury. Electronically signed by: Pinkie Pebbles MD 02/09/2024 08:09 PM EST RP Workstation: [id number]   DG Chest Portable 1 View Result Date: 02/09/2024 CLINICAL DATA:  Fall. EXAM: PORTABLE CHEST 1 VIEW COMPARISON:  Chest related 08/24/2023. FINDINGS: No focal consolidation, pleural effusion, pneumothorax. The cardiac silhouette is within normal limits. No acute osseous pathology. IMPRESSION: No active disease. Electronically Signed   By: Vanetta Chou M.D.   On: 02/09/2024 20:07   DG Hand Complete Right Result Date: 02/09/2024 EXAM: 3 OR MORE VIEW(S) XRAY OF THE RIGHT HAND 02/09/2024 08:03:38 PM COMPARISON: None available. CLINICAL HISTORY: fall FINDINGS: BONES AND JOINTS: No acute fracture. No malalignment. SOFT TISSUES: The soft tissues are unremarkable. IMPRESSION: 1. No acute traumatic injury. Electronically signed by: Pinkie Pebbles MD 02/09/2024 08:07 PM EST RP Workstation: [id number]     I have personally reviewed the images and agree with  the above interpretation.  Labs:    Latest Ref Rng & Units 02/09/2024    7:57 PM 09/22/2023    2:56 PM 09/21/2023   10:05 AM  CBC  WBC 4.0 - 10.5 K/uL 6.5  7.2  7.0   Hemoglobin 12.0 - 15.0 g/dL 89.0  88.1  87.7   Hematocrit 36.0 - 46.0 % 33.1  35.5  35.8   Platelets 150 - 400 K/uL 268  234  208.0       Latest Ref Rng & Units 02/09/2024    7:57 PM 11/20/2023    3:27 PM 10/07/2023   11:19 AM  BMP  Glucose 70 - 99 mg/dL 872  88    BUN 8 - 23 mg/dL 20  21  24    Creatinine 0.44 - 1.00 mg/dL 9.35  9.42  9.23   Sodium 135 - 145 mmol/L 141  140    Potassium 3.5 - 5.1 mmol/L 3.8  4.1    Chloride 98 - 111 mmol/L 103  101    CO2 22 - 32 mmol/L 25  31    Calcium  8.9 - 10.3 mg/dL 9.3  9.9      INR  1.1 (12/09  1959)   Assessment and Plan: Ms. Sek is a pleasant 78 y.o. female with ***  I have discussed the condition with the patient, including showing the radiographs and discussing treatment options in layman's terms.  The patient may benefit from conservative management.  Thus, I have recommended the following: ***.  We will plan to arrange follow-up in the neurosurgery clinic as an outpatient***.    Penne MICAEL Sharps, MD/MSCR Dept. of Neurosurgery

## 2024-02-09 NOTE — Anesthesia Preprocedure Evaluation (Signed)
 Anesthesia Evaluation  Patient identified by MRN, date of birth, ID band Patient unresponsive    Reviewed: Allergy  & Precautions, Patient's Chart, lab work & pertinent test resultsPreop documentation limited or incomplete due to emergent nature of procedure.  History of Anesthesia Complications (+) PONV and history of anesthetic complications  Airway Mallampati: Intubated  TM Distance: <3 FB Neck ROM: full    Dental  (+) Teeth Intact, Caps   Pulmonary asthma , sleep apnea , former smoker   Pulmonary exam normal        Cardiovascular Exercise Tolerance: Good + CAD and + Peripheral Vascular Disease  negative cardio ROS Normal cardiovascular exam Rhythm:Regular Rate:Normal     Neuro/Psych  Headaches  Anxiety     negative neurological ROS  negative psych ROS   GI/Hepatic negative GI ROS, Neg liver ROS, hiatal hernia,GERD  Medicated,,  Endo/Other  negative endocrine ROS    Renal/GU   negative genitourinary   Musculoskeletal   Abdominal Normal abdominal exam  (+)   Peds negative pediatric ROS (+)  Hematology negative hematology ROS (+) Blood dyscrasia, anemia   Anesthesia Other Findings Patient came to us  emergently for decompression of subdural hematoma.  She fell onto the concrete at her home, rapidly deteriorated in the ED, was intubate, and brought to the OR.  I just took care of the patient on 01/26/24 for hernia repair, so history was obtained from prior anesthetic note.  Emergency consent was signed by myself and the surgeon (Dr. Claudene).  Past Medical History: No date: Allergy  No date: Anemia No date: Angina pectoris No date: Anxiety No date: Aortic atherosclerosis No date: Asthma No date: Bilateral carotid artery disease No date: Bradycardia No date: CAD (coronary artery disease) No date: Chronic shortness of breath No date: Complication of anesthesia     Comment:  a.) PONV No date: Depression No date:  Diastolic dysfunction No date: Diverticulosis No date: Duodenal diverticulum No date: Family history of adverse reaction to anesthesia     Comment:  a.) PONV in first degree female relatives (mother and               daughters) No date: Fibromyalgia No date: Gastroesophageal reflux disease with hiatal hernia No date: Hiatal hernia No date: History of bilateral cataract extraction No date: History of kidney stones No date: Hyperlipidemia No date: IBS (irritable bowel syndrome) No date: Insomnia     Comment:  a.) takes trazodone  as needed No date: Left inguinal hernia No date: Long term current use of clopidogrel  No date: Long-term use of aspirin  therapy No date: Lymphocytic colitis No date: Migraine headache No date: Mitral valve prolapse No date: Neuropathy No date: Orthostatic hypotension No date: OSA (obstructive sleep apnea)     Comment:  a.) non-compliant with prescribed nocturnal PAP therapy;              unable to tolerate mask per report No date: Osteoporosis No date: Palpitations No date: Peripheral neuropathy No date: PONV (postoperative nausea and vomiting) No date: PSVT (paroxysmal supraventricular tachycardia) No date: 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 No date: RLS (restless legs syndrome) No date: Schatzki's ring     Comment:  a.) s/p esophageal dilitation procedures No date: Spondylosis 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  Past Surgical History: 11/14/2015: ANAL RECTAL MANOMETRY; N/A     Comment:  Procedure: ANO RECTAL MANOMETRY;  Surgeon: Lupita FORBES Commander, MD;  Location: [facility];  Service:               Endoscopy;  Laterality: N/A; No date: BREAST ENHANCEMENT SURGERY No date: CATARACT EXTRACTION; Bilateral No date: CHOLECYSTECTOMY No date: COLONOSCOPY No date: ESOPHAGEAL DILATION     Comment:  X 2 10/07/2023: RENAL INTERVENTION; N/A     Comment:  Procedure: RENAL INTERVENTION;  Surgeon: Marea Selinda RAMAN,                MD;  Location: [facility] INVASIVE CV LAB;  Service:               Cardiovascular;  Laterality: N/A; No date: ROTATOR CUFF REPAIR; Left 05/30/2014: SINUS ENDO W/FUSION; N/A     Comment:  Procedure: REVISION  FRONTAL SINUS SURGERY WITH FUSION               SCAN;  Surgeon: Alm Bouche, MD;  Location: [facility] OR;                Service: ENT;  Laterality: N/A; No date: SINUS SURGERY WITH INSTATRAK     Comment:  x 2 No date: TUBAL LIGATION No date: UPPER GASTROINTESTINAL ENDOSCOPY No date: Vaginal cystectomy     Comment:  x 2 05/2007: VAGINAL HYSTERECTOMY     Comment:  Vaginal repair, Dr Cary.  Partial  hysterectomy. 10/07/2023: VISCERAL ANGIOGRAPHY; N/A     Comment:  Procedure: VISCERAL ANGIOGRAPHY;  Surgeon: Marea Selinda RAMAN,              MD;  Location: [facility] INVASIVE CV LAB;  Service:               Cardiovascular;  Laterality: N/A; No date: WRIST ARTHROSCOPY     Reproductive/Obstetrics negative OB ROS                              Anesthesia Physical Anesthesia Plan  ASA: 4 and emergent  Anesthesia Plan: General   Post-op Pain Management:    Induction: Intravenous  PONV Risk Score and Plan: 4 or greater and Treatment may vary due to age or medical condition, Propofol  infusion and TIVA  Airway Management Planned: Oral ETT  Additional Equipment:   Intra-op Plan:   Post-operative Plan: Post-operative intubation/ventilation  Informed Consent: I have reviewed the patients History and Physical, chart, labs and discussed the procedure including the risks, benefits and alternatives for the proposed anesthesia with the patient or authorized representative who has indicated his/her understanding and acceptance.     Dental Advisory Given  Plan Discussed with: CRNA  Anesthesia Plan Comments:          Anesthesia Quick Evaluation

## 2024-02-09 NOTE — Progress Notes (Signed)
 Pt transported to CT on the vent and returned to ED 12 without incident. Pt then transported to OR on the vent also without incident.

## 2024-02-09 NOTE — ED Notes (Addendum)
 [facility] arrived to transport pt. Pts mental status deteriorating and left pupil changed. EDP aware and respiratory called for intubation assist. B.Smith, MD paged for status change.

## 2024-02-09 NOTE — ED Notes (Signed)
 Report given to OR RN at this time, opportunity to ask questions given to receiving RN, all questions answered.

## 2024-02-09 NOTE — ED Notes (Signed)
 OR charge called by clinical research associate to inform that pt in route to OR stat per B.Claudene, MD. Pt transferred off unit at this time with 2 RN escort and respiratory.

## 2024-02-09 NOTE — ED Notes (Signed)
 Provider at bedside for wound irrigation and staple placement.

## 2024-02-09 NOTE — ED Notes (Signed)
 Call [facility] spoke with Kim/ Consult to transport ICU-Neuro/

## 2024-02-09 NOTE — ED Notes (Signed)
 Bedside handoff given to OR RN and OR CRNA.

## 2024-02-09 NOTE — ED Notes (Signed)
 Assumed care of pt at this time. Pt's husband at bedside, Dr. Ernest at bedside suturing head wound.

## 2024-02-09 NOTE — Progress Notes (Incomplete)
   02/09/24 2300  Spiritual Encounters  Type of Visit Initial  Care provided to: [facility] partners present during encounter Physician  Referral source Nurse (RN/NT/LPN)  Reason for visit Urgent spiritual support  OnCall Visit Yes  Interventions  Spiritual Care Interventions Made Established relationship of care and support;Compassionate presence  Intervention Outcomes  Outcomes Connection to spiritual care   Chaplain met with spouse of patient. Spouse explained patient had falling in a parking and injured her head. The injury is bad and the family has to make a decision rather to do emergency surgery or not. Chaplain encouraged spouse and provided compassionate presence. Spouse called his step daughter to let her know the status of the patient. The daughter was in route and stated to doctor do whatever you needed to keep her alive until she arrives. Spouse thank Chaplain and said thank you coming and I will be ok. Chaplain shared please call if you want a Chaplain

## 2024-02-09 NOTE — ED Notes (Signed)
 Report called to [facility] hospital 4N 15-C, report called to [facility]. All questions answered.

## 2024-02-09 NOTE — ED Triage Notes (Signed)
 Pt reports she had a syncopal episode tonight while outside, pt fell on the pavement and has hematoma and bleeding to the right side of her head. Pt does not remember having LOC. Pt c/o severe pain to her head. Pt has been off blood thinners for aprox 3-4 weeks for a surgery she had to repair a hernia recently.

## 2024-02-09 NOTE — ED Notes (Signed)
 Pt to CT at this time with monitor and 2 Rns and RRT.

## 2024-02-09 NOTE — ED Provider Notes (Incomplete)
 [facility] Provider Note    Event Date/Time   First MD Initiated Contact with Patient 02/09/24 1910     (approximate)   History   Loss of Consciousness   HPI  [name] is a 78 y.o. female  ***       Physical Exam   Triage Vital Signs: ED Triage Vitals  Encounter Vitals Group     BP 02/09/24 1907 100/67     Girls Systolic BP Percentile --      Girls Diastolic BP Percentile --      Boys Systolic BP Percentile --      Boys Diastolic BP Percentile --      Pulse Rate 02/09/24 1907 71     Resp 02/09/24 1907 20     Temp 02/09/24 1907 98.4 F (36.9 C)     Temp src --      SpO2 02/09/24 1907 100 %     Weight 02/09/24 1906 117 lb (53.1 kg)     Height 02/09/24 1906 5' 7 (1.702 m)     Head Circumference --      Peak Flow --      Pain Score 02/09/24 1906 10     Pain Loc --      Pain Education --      Exclude from Growth Chart --     Most recent vital signs: Vitals:   02/09/24 1907  BP: 100/67  Pulse: 71  Resp: 20  Temp: 98.4 F (36.9 C)  SpO2: 100%     General: Awake, no distress.  CV:  Good peripheral perfusion.  Resp:  Normal effort.  Abd:  No distention.  Other:     ED Results / Procedures / Treatments   Labs (all labs ordered are listed, but only abnormal results are displayed) Labs Reviewed  COMPREHENSIVE METABOLIC PANEL WITH GFR  CBC  URINALYSIS, ROUTINE W REFLEX MICROSCOPIC  CBG MONITORING, ED  TROPONIN T, HIGH SENSITIVITY     EKG  My interpretation of EKG:    RADIOLOGY I have reviewed the xray personally and interpreted    PROCEDURES:  Critical Care performed: {CriticalCareYesNo:19197::Yes, see critical care procedure note(s),No}  Procedures   MEDICATIONS ORDERED IN ED: Medications  acetaminophen  (TYLENOL ) tablet 1,000 mg (has no administration in time range)  Tdap (ADACEL ) injection 0.5 mL (has no administration in time range)     IMPRESSION / MDM / ASSESSMENT AND PLAN  / ED COURSE  I reviewed the triage vital signs and the nursing notes.   Patient's presentation is most consistent with {EM COPA:27473}   Initial blood pressures where less than 161 patient came in as she was following commands although she did seem very confused she was able to wiggle toes.  7:49 PM Reviewed CT imaging and concern for bleeding discussed with NS and reviewing imagines to see if CT angio needed  8:01 PM Spoke with Dr Penne does recommend CT angio, getting CT CAP with it for trauma scan given poor historian and reported some chest pain as well.   8:37 PM patient's mental status is worsening.  Trying to get stat CT angio read and have tried to call radiology now 2 times to talk to the radiologist.  No change in the bleeding of the CT head but CT angio was concerning for potential aneurysm.  Initially the concern was that this although small could be contributing and there was thought about sending patient for angiogram.  However after further discussions  with [name] the plan was to not have angiogram.   I was in patient's room doing laceration repair as she had significant bleeding from her right side of her scalp.  For sutures had to be placed 2 figure-of-eight's to staple and of finally the bleeding was able to slow down.  Patient's mental status seems to have worsened therefore although they are here to transport patient I am concerned about her worsening mental status.  She now is spontaneously moving her left arm but otherwise has some sonorous respirations although eyes are open.  Eyes are midline do not feel she is actively seizing but will give a gram of Keppra .  I have decided to intubate patient for airway protection and I have discussed the case again with Dr. Claudene who did want to get a repeat CT head here     The patient is on the cardiac monitor to evaluate for evidence of arrhythmia and/or significant heart rate changes.      FINAL CLINICAL IMPRESSION(S) /  ED DIAGNOSES   Final diagnoses:  None     Rx / DC Orders   ED Discharge Orders     None        Note:  This document was prepared using Dragon voice recognition software and may include unintentional dictation errors.

## 2024-02-10 ENCOUNTER — Encounter: Admitting: Physician Assistant

## 2024-02-10 ENCOUNTER — Encounter: Payer: Self-pay | Admitting: Neurosurgery

## 2024-02-10 ENCOUNTER — Encounter: Payer: Self-pay | Admitting: Internal Medicine

## 2024-02-10 ENCOUNTER — Inpatient Hospital Stay

## 2024-02-10 DIAGNOSIS — R001 Bradycardia, unspecified: Secondary | ICD-10-CM

## 2024-02-10 DIAGNOSIS — Z823 Family history of stroke: Secondary | ICD-10-CM | POA: Diagnosis not present

## 2024-02-10 DIAGNOSIS — I701 Atherosclerosis of renal artery: Secondary | ICD-10-CM | POA: Diagnosis present

## 2024-02-10 DIAGNOSIS — J9601 Acute respiratory failure with hypoxia: Secondary | ICD-10-CM

## 2024-02-10 DIAGNOSIS — S065XAA Traumatic subdural hemorrhage with loss of consciousness status unknown, initial encounter: Secondary | ICD-10-CM | POA: Diagnosis not present

## 2024-02-10 DIAGNOSIS — Z833 Family history of diabetes mellitus: Secondary | ICD-10-CM | POA: Diagnosis not present

## 2024-02-10 DIAGNOSIS — I2510 Atherosclerotic heart disease of native coronary artery without angina pectoris: Secondary | ICD-10-CM | POA: Diagnosis present

## 2024-02-10 DIAGNOSIS — J95821 Acute postprocedural respiratory failure: Secondary | ICD-10-CM | POA: Diagnosis not present

## 2024-02-10 DIAGNOSIS — M810 Age-related osteoporosis without current pathological fracture: Secondary | ICD-10-CM | POA: Diagnosis present

## 2024-02-10 DIAGNOSIS — Z66 Do not resuscitate: Secondary | ICD-10-CM | POA: Diagnosis present

## 2024-02-10 DIAGNOSIS — Z7902 Long term (current) use of antithrombotics/antiplatelets: Secondary | ICD-10-CM | POA: Diagnosis not present

## 2024-02-10 DIAGNOSIS — R402132 Coma scale, eyes open, to sound, at arrival to emergency department: Secondary | ICD-10-CM | POA: Diagnosis present

## 2024-02-10 DIAGNOSIS — J9602 Acute respiratory failure with hypercapnia: Secondary | ICD-10-CM

## 2024-02-10 DIAGNOSIS — Z23 Encounter for immunization: Secondary | ICD-10-CM | POA: Diagnosis present

## 2024-02-10 DIAGNOSIS — S06A1XA Traumatic brain compression with herniation, initial encounter: Secondary | ICD-10-CM | POA: Diagnosis present

## 2024-02-10 DIAGNOSIS — I471 Supraventricular tachycardia, unspecified: Secondary | ICD-10-CM

## 2024-02-10 DIAGNOSIS — Z7982 Long term (current) use of aspirin: Secondary | ICD-10-CM | POA: Diagnosis not present

## 2024-02-10 DIAGNOSIS — E785 Hyperlipidemia, unspecified: Secondary | ICD-10-CM

## 2024-02-10 DIAGNOSIS — Z8262 Family history of osteoporosis: Secondary | ICD-10-CM | POA: Diagnosis not present

## 2024-02-10 DIAGNOSIS — W19XXXA Unspecified fall, initial encounter: Secondary | ICD-10-CM | POA: Diagnosis not present

## 2024-02-10 DIAGNOSIS — M797 Fibromyalgia: Secondary | ICD-10-CM | POA: Diagnosis present

## 2024-02-10 DIAGNOSIS — G9340 Encephalopathy, unspecified: Secondary | ICD-10-CM

## 2024-02-10 DIAGNOSIS — I341 Nonrheumatic mitral (valve) prolapse: Secondary | ICD-10-CM | POA: Diagnosis present

## 2024-02-10 DIAGNOSIS — G8191 Hemiplegia, unspecified affecting right dominant side: Secondary | ICD-10-CM | POA: Diagnosis present

## 2024-02-10 DIAGNOSIS — Z9889 Other specified postprocedural states: Secondary | ICD-10-CM

## 2024-02-10 DIAGNOSIS — R4182 Altered mental status, unspecified: Secondary | ICD-10-CM | POA: Diagnosis present

## 2024-02-10 DIAGNOSIS — S065X9A Traumatic subdural hemorrhage with loss of consciousness of unspecified duration, initial encounter: Secondary | ICD-10-CM | POA: Diagnosis not present

## 2024-02-10 DIAGNOSIS — Z515 Encounter for palliative care: Secondary | ICD-10-CM | POA: Diagnosis not present

## 2024-02-10 DIAGNOSIS — G2581 Restless legs syndrome: Secondary | ICD-10-CM | POA: Diagnosis present

## 2024-02-10 DIAGNOSIS — Z87891 Personal history of nicotine dependence: Secondary | ICD-10-CM | POA: Diagnosis not present

## 2024-02-10 DIAGNOSIS — Z8249 Family history of ischemic heart disease and other diseases of the circulatory system: Secondary | ICD-10-CM | POA: Diagnosis not present

## 2024-02-10 DIAGNOSIS — W1830XA Fall on same level, unspecified, initial encounter: Secondary | ICD-10-CM | POA: Diagnosis present

## 2024-02-10 LAB — CBC
HCT: 23.9 % — ABNORMAL LOW (ref 36.0–46.0)
Hemoglobin: 7.6 g/dL — ABNORMAL LOW (ref 12.0–15.0)
MCH: 30.4 pg (ref 26.0–34.0)
MCHC: 31.8 g/dL (ref 30.0–36.0)
MCV: 95.6 fL (ref 80.0–100.0)
Platelets: 190 K/uL (ref 150–400)
RBC: 2.5 MIL/uL — ABNORMAL LOW (ref 3.87–5.11)
RDW: 13.9 % (ref 11.5–15.5)
WBC: 19.8 K/uL — ABNORMAL HIGH (ref 4.0–10.5)
nRBC: 0 % (ref 0.0–0.2)

## 2024-02-10 LAB — BASIC METABOLIC PANEL WITH GFR
Anion gap: 13 (ref 5–15)
BUN: 18 mg/dL (ref 8–23)
CO2: 21 mmol/L — ABNORMAL LOW (ref 22–32)
Calcium: 8.2 mg/dL — ABNORMAL LOW (ref 8.9–10.3)
Chloride: 106 mmol/L (ref 98–111)
Creatinine, Ser: 0.63 mg/dL (ref 0.44–1.00)
GFR, Estimated: >60 mL/min (ref >60)
Glucose, Bld: 198 mg/dL — ABNORMAL HIGH (ref 70–99)
Potassium: 3.5 mmol/L (ref 3.5–5.1)
Sodium: 140 mmol/L (ref 135–145)

## 2024-02-10 LAB — BLOOD GAS, ARTERIAL
Acid-base deficit: 1.1 mmol/L (ref 0.0–2.0)
Bicarbonate: 24.8 mmol/L (ref 20.0–28.0)
FIO2: 100 %
MECHVT: 350 mL
Mechanical Rate: 20
O2 Saturation: 97.5 %
PEEP: 5 cmH2O
Patient temperature: 37
pCO2 arterial: 45 mmHg (ref 32–48)
pH, Arterial: 7.35 (ref 7.35–7.45)
pO2, Arterial: 397 mmHg — ABNORMAL HIGH (ref 83–108)

## 2024-02-10 LAB — TROPONIN T, HIGH SENSITIVITY: Troponin T High Sensitivity: 44 ng/L — ABNORMAL HIGH (ref 0–19)

## 2024-02-10 LAB — GLUCOSE, CAPILLARY: Glucose-Capillary: 185 mg/dL — ABNORMAL HIGH (ref 70–99)

## 2024-02-10 LAB — ABO/RH: ABO/RH(D): AB POS

## 2024-02-10 LAB — MAGNESIUM: Magnesium: 1.7 mg/dL (ref 1.7–2.4)

## 2024-02-10 LAB — LACTIC ACID, PLASMA
Lactic Acid, Venous: 5 mmol/L (ref 0.5–1.9)
Lactic Acid, Venous: 5 mmol/L (ref 0.5–1.9)

## 2024-02-10 LAB — PHOSPHORUS: Phosphorus: 2.8 mg/dL (ref 2.5–4.6)

## 2024-02-10 LAB — PREPARE RBC (CROSSMATCH)

## 2024-02-10 LAB — PROCALCITONIN: Procalcitonin: 0.16 ng/mL

## 2024-02-10 MED ORDER — GLYCOPYRROLATE 1 MG PO TABS: 1.0000 mg | ORAL_TABLET | ORAL | Status: DC | PRN

## 2024-02-10 MED ORDER — ROCURONIUM BROMIDE 100 MG/10ML IV SOLN
INTRAVENOUS | Status: DC | PRN
  Administered 2024-02-09: 50 mg via INTRAVENOUS
  Administered 2024-02-10: 30 mg via INTRAVENOUS
  Administered 2024-02-10: 20 mg via INTRAVENOUS

## 2024-02-10 MED ORDER — LACTATED RINGERS IV SOLN: INTRAVENOUS | Status: DC

## 2024-02-10 MED ORDER — HYDRALAZINE HCL 20 MG/ML IJ SOLN: 10.0000 mg | INTRAMUSCULAR | Status: DC | PRN

## 2024-02-10 MED ORDER — ACETAMINOPHEN 10 MG/ML IV SOLN
INTRAVENOUS | Status: AC
  Filled 2024-02-10: qty 100

## 2024-02-10 MED ORDER — FENTANYL BOLUS VIA INFUSION: 25.0000 ug | INTRAVENOUS | Status: DC | PRN

## 2024-02-10 MED ORDER — SODIUM CHLORIDE 0.9% IV SOLUTION: Freq: Once | INTRAVENOUS | Status: DC

## 2024-02-10 MED ORDER — CHLORHEXIDINE GLUCONATE CLOTH 2 % EX PADS: 6.0000 | MEDICATED_PAD | Freq: Every day | CUTANEOUS | Status: DC

## 2024-02-10 MED ORDER — SODIUM CHLORIDE 0.9 % IV SOLN: 250.0000 mL | INTRAVENOUS | Status: DC

## 2024-02-10 MED ORDER — FENTANYL 2500MCG IN NS 250ML (10MCG/ML) PREMIX INFUSION
0.0000 ug/h | INTRAVENOUS | Status: DC
  Administered 2024-02-10: 25 ug/h via INTRAVENOUS
  Filled 2024-02-10: qty 250

## 2024-02-10 MED ORDER — DOCUSATE SODIUM 100 MG PO CAPS: 100.0000 mg | ORAL_CAPSULE | Freq: Two times a day (BID) | ORAL | Status: DC | PRN

## 2024-02-10 MED ORDER — ACETAMINOPHEN 10 MG/ML IV SOLN
INTRAVENOUS | Status: DC | PRN
  Administered 2024-02-10: 1000 mg via INTRAVENOUS

## 2024-02-10 MED ORDER — DOCUSATE SODIUM 50 MG/5ML PO LIQD: 100.0000 mg | Freq: Two times a day (BID) | ORAL | Status: DC

## 2024-02-10 MED ORDER — SODIUM CHLORIDE 0.9 % IV SOLN
3.0000 g | Freq: Four times a day (QID) | INTRAVENOUS | Status: DC
  Filled 2024-02-10 (×2): qty 8

## 2024-02-10 MED ORDER — ACETAMINOPHEN 325 MG PO TABS: 650.0000 mg | ORAL_TABLET | Freq: Four times a day (QID) | ORAL | Status: DC | PRN

## 2024-02-10 MED ORDER — LACTATED RINGERS IV BOLUS
500.0000 mL | Freq: Once | INTRAVENOUS | Status: AC
  Administered 2024-02-10: 500 mL via INTRAVENOUS

## 2024-02-10 MED ORDER — SODIUM CHLORIDE 0.9 % IV SOLN: INTRAVENOUS | Status: DC

## 2024-02-10 MED ORDER — HYDRALAZINE HCL 20 MG/ML IJ SOLN
10.0000 mg | INTRAMUSCULAR | Status: DC | PRN
  Filled 2024-02-10: qty 1

## 2024-02-10 MED ORDER — DEXAMETHASONE SOD PHOSPHATE PF 10 MG/ML IJ SOLN
INTRAMUSCULAR | Status: DC | PRN
  Administered 2024-02-09: 10 mg via INTRAVENOUS

## 2024-02-10 MED ORDER — ORAL CARE MOUTH RINSE: 15.0000 mL | OROMUCOSAL | Status: DC | PRN

## 2024-02-10 MED ORDER — SURGIFLO WITH THROMBIN (HEMOSTATIC MATRIX KIT) OPTIME
TOPICAL | Status: DC | PRN
  Administered 2024-02-10: 1 via TOPICAL

## 2024-02-10 MED ORDER — LIDOCAINE-EPINEPHRINE 1 %-1:100000 IJ SOLN
INTRAMUSCULAR | Status: DC | PRN
  Administered 2024-02-09: 10 mL

## 2024-02-10 MED ORDER — PROPOFOL 1000 MG/100ML IV EMUL
INTRAVENOUS | Status: AC
  Filled 2024-02-10: qty 100

## 2024-02-10 MED ORDER — FAMOTIDINE 20 MG PO TABS: 20.0000 mg | ORAL_TABLET | Freq: Two times a day (BID) | ORAL | Status: DC

## 2024-02-10 MED ORDER — HYDRALAZINE HCL 20 MG/ML IJ SOLN
10.0000 mg | Freq: Once | INTRAMUSCULAR | Status: AC
  Administered 2024-02-10: 10 mg via INTRAVENOUS
  Filled 2024-02-10: qty 1

## 2024-02-10 MED ORDER — FENTANYL CITRATE (PF) 100 MCG/2ML IJ SOLN
INTRAMUSCULAR | Status: DC | PRN
  Administered 2024-02-10 (×2): 50 ug via INTRAVENOUS

## 2024-02-10 MED ORDER — PROPOFOL 1000 MG/100ML IV EMUL
0.0000 ug/kg/min | INTRAVENOUS | Status: DC
  Administered 2024-02-10: 40 ug/kg/min via INTRAVENOUS

## 2024-02-10 MED ORDER — MORPHINE SULFATE (PF) 2 MG/ML IV SOLN
2.0000 mg | INTRAVENOUS | Status: DC | PRN
  Filled 2024-02-10: qty 2

## 2024-02-10 MED ORDER — MIDAZOLAM HCL (PF) 2 MG/2ML IJ SOLN: 1.0000 mg | INTRAMUSCULAR | Status: DC | PRN

## 2024-02-10 MED ORDER — LORAZEPAM 2 MG/ML IJ SOLN: 2.0000 mg | INTRAMUSCULAR | Status: DC | PRN

## 2024-02-10 MED ORDER — POLYETHYLENE GLYCOL 3350 17 G PO PACK: 17.0000 g | PACK | Freq: Every day | ORAL | Status: DC

## 2024-02-10 MED ORDER — GLYCOPYRROLATE 0.2 MG/ML IJ SOLN: 0.2000 mg | INTRAMUSCULAR | Status: DC | PRN

## 2024-02-10 MED ORDER — BACITRACIN 500 UNIT/GM EX OINT
TOPICAL_OINTMENT | CUTANEOUS | Status: DC | PRN
  Administered 2024-02-10: 1 via TOPICAL

## 2024-02-10 MED ORDER — ONDANSETRON HCL 4 MG/2ML IJ SOLN
INTRAMUSCULAR | Status: DC | PRN
  Administered 2024-02-10: 4 mg via INTRAVENOUS

## 2024-02-10 MED ORDER — ORAL CARE MOUTH RINSE
15.0000 mL | OROMUCOSAL | Status: DC
  Administered 2024-02-10 (×3): 15 mL via OROMUCOSAL

## 2024-02-10 MED ORDER — FENTANYL CITRATE (PF) 50 MCG/ML IJ SOSY: 25.0000 ug | PREFILLED_SYRINGE | Freq: Once | INTRAMUSCULAR | Status: DC

## 2024-02-10 MED ORDER — POLYETHYLENE GLYCOL 3350 17 G PO PACK: 17.0000 g | PACK | Freq: Every day | ORAL | Status: DC | PRN

## 2024-02-10 MED ORDER — POLYVINYL ALCOHOL 1.4 % OP SOLN: 1.0000 [drp] | Freq: Four times a day (QID) | OPHTHALMIC | Status: DC | PRN

## 2024-02-10 MED ORDER — PHENYLEPHRINE 80 MCG/ML (10ML) SYRINGE FOR IV PUSH (FOR BLOOD PRESSURE SUPPORT)
PREFILLED_SYRINGE | INTRAVENOUS | Status: DC | PRN
  Administered 2024-02-09 – 2024-02-10 (×14): 160 ug via INTRAVENOUS

## 2024-02-10 MED ORDER — LEVETIRACETAM (KEPPRA) 500 MG/5 ML ADULT IV PUSH
500.0000 mg | Freq: Two times a day (BID) | INTRAVENOUS | Status: DC
  Filled 2024-02-10: qty 5

## 2024-02-10 MED ORDER — PHENYLEPHRINE HCL-NACL 20-0.9 MG/250ML-% IV SOLN
25.0000 ug/min | INTRAVENOUS | Status: DC
  Administered 2024-02-10: 25 ug/min via INTRAVENOUS
  Filled 2024-02-10 (×2): qty 250

## 2024-02-10 MED ORDER — EPHEDRINE SULFATE-NACL 50-0.9 MG/10ML-% IV SOSY
PREFILLED_SYRINGE | INTRAVENOUS | Status: DC | PRN
  Administered 2024-02-10 (×2): 10 mg via INTRAVENOUS
  Administered 2024-02-10 (×2): 5 mg via INTRAVENOUS
  Administered 2024-02-10: 10 mg via INTRAVENOUS

## 2024-02-10 MED ORDER — LACTATED RINGERS IV SOLN: INTRAVENOUS | Status: DC | PRN

## 2024-02-10 MED ORDER — 0.9 % SODIUM CHLORIDE (POUR BTL) OPTIME
TOPICAL | Status: DC | PRN
  Administered 2024-02-10: 500 mL

## 2024-02-10 MED ORDER — ACETAMINOPHEN 650 MG RE SUPP: 650.0000 mg | Freq: Four times a day (QID) | RECTAL | Status: DC | PRN

## 2024-02-11 LAB — TYPE AND SCREEN
ABO/RH(D): AB POS
Antibody Screen: NEGATIVE
Unit division: 0

## 2024-02-11 LAB — BPAM RBC
Blood Product Expiration Date: 202512192359
ISSUE DATE / TIME: 202512100412
Unit Type and Rh: 600

## 2024-03-03 NOTE — Progress Notes (Signed)
 CT head wo contrast 2024/03/03:  Large intraparenchymal hemorrhage within the left temporoparietal region, measuring up to approximately 6.3 x 5.5 x 3.2 cm. 2. New hemorrhage within the midbrain and pons, compressed by uncal herniation. 3. Persistent bilateral subarachnoid hemorrhage and intraventricular hemorrhage, layering independently within the posterior horns of the lateral ventricles. 4. Substantially improved left-sided subdural hematoma, now measuring approximately 7 mm, with a subdural drain in place.  Follow up CT head results obtained. Discussed results with neurosurgery, patient not a candidate for further intervention. New hemorrhage considered fatal. Called and updated husband on clinical status change and he is on his way back to the hospital.   Jenita Ruth Rust-Chester, AGACNP-BC Acute Care Nurse Practitioner [facility]   [phone] / [phone] Please see Amion for details.

## 2024-03-03 NOTE — Progress Notes (Signed)
Pt transported to morgue at this time.

## 2024-03-03 NOTE — Progress Notes (Signed)
 Overnight postoperatively unfortunate patient had another episode of Cushing's-like response with severe elevation of her blood pressure and loss of bilateral pupillary reflexes.  By the time she was evaluated had fixed and dilated pupils bilaterally.  Was taken for CT scan which demonstrated a massive frontotemporal hematoma with severe left-to-right midline shift and brainstem hemorrhage.  Given these findings family wished to go forward with comfort care measures.  She was eventually extubated and died approximately 15 minutes later.  Family was at bedside.

## 2024-03-03 NOTE — Progress Notes (Signed)
Pt extubated to comfort care at this time.  Family at bedside.

## 2024-03-03 NOTE — Transfer of Care (Signed)
 Immediate Anesthesia Transfer of Care Note  Patient: [name]  Procedure(s) Performed: CRANIOTOMY HEMATOMA EVACUATION SUBDURAL (Left)  Patient Location: PACU and ICU  Anesthesia Type:General  Level of Consciousness: Patient remains intubated per anesthesia plan  Airway & Oxygen Therapy: Patient placed on Ventilator (see vital sign flow sheet for setting)  Post-op Assessment: Report given to RN  Post vital signs: Reviewed and stable  Last Vitals:  Vitals Value Taken Time  BP 64/54 03-02-24 01:36  Temp 34.9 C 03-02-2024 01:37  Pulse 81 03-02-24 01:37  Resp 20 02-Mar-2024 01:37  SpO2 100 % 2024-03-02 01:37  Vitals shown include unfiled device data.  Last Pain:  Vitals:   02/09/24 1906  PainSc: 10-Worst pain ever         Complications: No notable events documented.

## 2024-03-03 NOTE — Progress Notes (Signed)
 0315 increase bleeding around  JP site and Pupils had gone from pupils being R 3 and L 6 to them both being 6s.   0319 Dr Penne Sharps from Neurosurgery called and notified about bleeding around site and changed in pupils. Stat Head ordered. NP Jenita Ruth [facility] also notified.

## 2024-03-03 NOTE — Op Note (Signed)
 Indications: The patient is a 79yo female who presented with a subdural hematoma.  She had progressive decline in her neurologic examination and neurologic status developed left-sided blown pupil and hemiplegia,  Findings: subdural hematoma  Preoperative Diagnosis: subdural hematoma Postoperative Diagnosis: same   EBL: 1000 cc IVF: See anesthesia report Drains: JP drain to bulb suction Disposition: Extubated and Stable to PACU Complications: none  A foley catheter was placed.   Preoperative Note:   Risks of surgery discussed include: infection, bleeding, stroke, coma, death, paralysis, CSF leak, nerve/spinal cord injury, numbness, tingling, weakness, vascular injury, need for further surgery, persistent symptoms, and the risks of anesthesia. The patient understood these risks and agreed to proceed.  NAME OF PROCEDURE:               1.  Left-sided craniotomy for evacuation of hematoma   PROCEDURE:  Patient was brought to the operating room, intubated.  They were placed on a horseshoe.  The patient was then positioned for a left-sided-sided frontotemporal parietal craniotomy.    The incision was planned, then prepped and draped in standard fashion.  The incision was opened sharply, then the galea opened.  The flap was retracted.The temporalis muscle was divided, then the periosteal used to reflect the muscle.   A left-sided frontotemporoparietal craniotomy was then fashioned with the burr and craniotome.  The dura was identified, then opened sharply.  A holohemispheric tense subdural hematoma was identified.  The acute subdural was then removed using irrigation and suction.  After removal, the intradural space was inspected.  There were severe contusions noted along the majority of the cerebral hemisphere.  Some areas noted to be superficially hemorrhagic, Surgicel was placed onto these after bleeding was controlled.   there was some continuous weeping through some of the superficially  hemorrhagic areas of the parenchyma.  Because of this a subdural/epidural drain was placed., we turned attention to closure.  The brain was soft without any evidence of outward herniation because of this the dura was approximated.  DuraGen was placed over top of the deficit.. The craniotomy site was checked and a fixation plate used to reconstruct the skull.  The temporalis and galea were closed.  Staples were used on the skin. A sterile dressing was placed.    Needle, lap and all counts were correct at the end of the case.    Penne MICAEL Sharps, MD Neurosurgery

## 2024-03-03 NOTE — IPAL (Signed)
°  Interdisciplinary Goals of Care Family Meeting   Date carried out: 03-01-2024  Location of the meeting: Bedside  Member's involved: Nurse Practitioner, Bedside Registered Nurse, and Family Member or next of kin  Durable Power of Attorney or acting medical decision maker: Husband, [name]    Discussion: We discussed goals of care for [name] including overall grave prognosis and what the patient's wishes would be if she was able to speak for herself. The daughter was able to verbalize that she wouldn't want any of this. And Mr. [name] reported that if it's her time to go home that's what she would want. We discussed DNR code status at length and family decided this would best align with the patient's wishes at this point.  Code status:   Code Status: Do not attempt resuscitation (DNR) PRE-ARREST INTERVENTIONS DESIRED   Disposition: Continue current acute care  Time spent for the meeting: 20 minutes   [name], AGACNP-BC Acute Care Nurse Practitioner [facility]   [phone] / [phone] Please see Amion for details.

## 2024-03-03 NOTE — Progress Notes (Signed)
 Patient extubated at 0724 per MD order with no complications.

## 2024-03-03 NOTE — Progress Notes (Signed)
 RT transported pt form ICU to and from CT on vent with no distress or incidents to report. RT will cont to follow.

## 2024-03-03 NOTE — Death Summary Note (Signed)
 DEATH SUMMARY   Patient Details  Name: [name] MRN: [id number] DOB: Jul 08, 1945  Admission/Discharge Information   Admit Date:  14-Feb-2024  Date of Death: Date of Death: 2024-02-15  Time of Death: Time of Death: 0749  Length of Stay: 0  Referring Physician: Geofm Glade PARAS, MD   Reason(s) for Hospitalization  Fall   Diagnoses  Preliminary cause of death: Intraparenchymal hematoma of brain (HCC) Secondary Diagnoses (including complications and co-morbidities):  Principal Problem:   Subdural hematoma (HCC) Active Problems:   Post-operative state   Acute hypoxic/hypercapnic respiratory failure secondary to acute encephalopathy    Bradycardia    Paroxysmal SVT   Hyperlipidemia   Brief Hospital Course (including significant findings, care, treatment, and services provided and events leading to death)  [name] is a 79 y.o. year old female who presented to [facility] ER on February 15, 2024 following a fall on pavement getting out of her care.  Upon arrival to the ER pt alert/responsive but confused.  CT Head revealed extensive left greater that right subarachnoid hemorrhage, bilateral subdural hematomas with a 4 mm midline shift and large scalp hematoma decision made to transfer pt to [facility] to the Neuro ICU. While in [facility] ED pending transfer to [facility] Neuro ICU due to concern for [facility] and potential need for angiogram. Subsequently CT angio resulted ultimate culprit of bleed may be an injured vein. Upon transfer team arrival at bedside the patient decompensated requiring emergent intubation and mechanical ventilatory support. Repeat CTH showed increased subdural hemorrhage, mass effect as well as subfalacine and left sided uncal herniation. Patient taken emergently to [facility] OR for a craniotomy with evacuation of left subdural hematoma.  Pt admitted to [facility] ICU post craniotomy intervention.  While in the ICU pt had worsening bleeding around JP drain site and  neurological changes.  Stat CT Head revealed new hemorrhage and upon review by neurosurgery findings deemed fatal and pt deemed not a candidate for further intervention.  Pts family decided to transition pt to Comfort Measures Only.  Pt expired on 02/15/24 at 07:49 am with family present at bedside.    Pertinent Labs and Studies   Microbiology No results found for this or any previous visit (from the past 240 hours).  Lab Basic Metabolic Panel: Recent Labs  Lab 02/14/2024 1957 2024-02-15 0147  NA 141 140  K 3.8 3.5  CL 103 106  CO2 25 21*  GLUCOSE 127* 198*  BUN 20 18  CREATININE 0.64 0.63  CALCIUM  9.3 8.2*  MG  --  1.7  PHOS  --  2.8   Liver Function Tests: Recent Labs  Lab 2024/02/14 1957  AST 27  ALT 27  ALKPHOS 58  BILITOT <0.2  PROT 6.4*  ALBUMIN 4.1   No results for input(s): LIPASE, AMYLASE in the last 168 hours. No results for input(s): AMMONIA in the last 168 hours. CBC: Recent Labs  Lab Feb 14, 2024 1957 02-15-24 0147  WBC 6.5 19.8*  HGB 10.9* 7.6*  HCT 33.1* 23.9*  MCV 92.2 95.6  PLT 268 190   Cardiac Enzymes: No results for input(s): CKTOTAL, CKMB, CKMBINDEX, TROPONINI in the last 168 hours. Sepsis Labs: Recent Labs  Lab 2024-02-14 1957 15-Feb-2024 0142 02-15-2024 0147 February 15, 2024 0248  PROCALCITON  --   --  0.16  --   WBC 6.5  --  19.8*  --   LATICACIDVEN  --  5.0*  --  5.0*    Procedures/Operations  Mechanical Intubation Left-sided craniotomy for evacuation of hematoma  Lonell Moose, AGNP  Pulmonary/Critical Care Pager [phone] (please enter 7 digits) PCCM Consult Pager [phone] (please enter 7 digits)

## 2024-03-03 NOTE — Progress Notes (Signed)
 TOD [id number]. Family at bedside.

## 2024-03-03 NOTE — Progress Notes (Signed)
 eLink Physician-Brief Progress Note Patient Name: Courtnee Myer DOB: [birth year] MRN: [id number]   Date of Service  2024/03/10  HPI/Events of Note  Patient admitted to the ICU with post-operative respiratory failure following evacuation of a sub-dural hematoma that was caused by a fall.  eICU Interventions  New Patient Evaluation.        Diedre Maclellan U Sarabella Caprio 10-Mar-2024, 2:48 AM

## 2024-03-03 NOTE — Brief Op Note (Signed)
 02/09/2024 - 02-12-24  1:20 AM  PATIENT:  [name]  79 y.o. female  PRE-OPERATIVE DIAGNOSIS:  left subdural hematoma  POST-OPERATIVE DIAGNOSIS:  left subdural hematoma  PROCEDURE:  Procedure(s): CRANIOTOMY HEMATOMA EVACUATION SUBDURAL (Left)  SURGEON:  Surgeons and Role:    DEWAINE Claudene Penne LELON, MD - Primary  PHYSICIAN ASSISTANT: none  ASSISTANTS: none   ANESTHESIA:   local  EBL:  1000 mL   BLOOD ADMINISTERED:none  DRAINS: (1) Jackson-Pratt drain(s) with closed bulb suction in the subdural/epidural space   LOCAL MEDICATIONS USED:  LIDOCAINE    SPECIMEN:  No Specimen  DISPOSITION OF SPECIMEN:  N/A  COUNTS:  YES  TOURNIQUET:  * No tourniquets in log *  DICTATION: .Dragon Dictation  PLAN OF CARE:  Neurologic: Keppra  500 twice daily, repeat head CT when stable okay for morning shift, hourly exam, systolic blood pressure less than 160, avoid hyponatremia, JP drain in the subdural/epidural space to close bulb suction.  Expect bright red blood and CSF as output.   PATIENT DISPOSITION:  ICU - intubated and critically ill.   Delay start of Pharmacological VTE agent (>24hrs) due to surgical blood loss or risk of bleeding: no

## 2024-03-03 NOTE — IPAL (Signed)
°  Interdisciplinary Goals of Care Family Meeting   Date carried out: February 13, 2024  Location of the meeting: Bedside  Member's involved: Nurse Practitioner and Family Member or next of kin  Durable Power of Attorney or acting medical decision maker: husband and daughter    Discussion: We discussed goals of care for [name] .  The patient developed a new intraparenchymal hemorrhage that is non survivable per neurosurgery. Decision made to withdraw and transition to comfort measures.  Code status:   Code Status: Do not attempt resuscitation (DNR) - Comfort care   Disposition: In-patient comfort care  Time spent for the meeting: 15 minutes  [name], AGACNP-BC Acute Care Nurse Practitioner [facility]   [phone] / [phone] Please see Amion for details.

## 2024-03-03 NOTE — Anesthesia Postprocedure Evaluation (Signed)
 Anesthesia Post Note  Patient: [name]  Procedure(s) Performed: CRANIOTOMY HEMATOMA EVACUATION SUBDURAL (Left)  Patient location during evaluation: ICU Anesthesia Type: General Level of consciousness: obtunded/minimal responses and comatose Anesthetic complications: no Comments: Pt was extubated this am, pt now receiving EOL care   No notable events documented.   Last Vitals:  Vitals:   03/01/24 0700 March 01, 2024 0727  BP: 120/74 (!) 86/55  Pulse: 73 (!) 54  Resp: 20 (!) 0  Temp: (!) 36.2 C (!) 36.3 C  SpO2: 100% (!) 0%    Last Pain:  Vitals:   03-01-24 0415  TempSrc: Oral  PainSc:                  Leontine Morene SQUIBB

## 2024-03-03 NOTE — Progress Notes (Incomplete)
 NAME:  [name], MRN:  [id number], DOB:  Sep 21, 1945, LOS: 0 ADMISSION DATE:  02/09/2024, CONSULTATION DATE:  *** REFERRING MD:  Dr. Claudene, CHIEF COMPLAINT:  Fall   History of Present Illness:  79 yo F presenting to [facility] ED from home for evaluation of a fall.  History obtained per chart review. Patient was in her normal state of health until falling on the pavement getting out of her car. Unclear if this was due to a syncopal episode or simply from a fall.  She had been off blood thinners for the past 3 weeks due to a hernia repair on 01/26/24.  ED course: Upon arrival patient alert and responsive with stable vitals. Labs WNL. Imaging revealed extensive L > R SAH and bilateral subdural hematomas with a 4 mm midline shift and a large scalp hematoma, suspected non displaced skull fracture and potential sphenoid sinus/central skull base fracture. While in ED pending transfer to [facility] Neuro ICU the patient decompensated requiring emergent intubation and mechanical ventilatory support. Repeat CTH showed increased subdural hemorrhage, mass effect as well as subfalacine and left sided uncal herniation. Patient taken emergently to the OR for a craniotomy. Medications given: etomidate / fentanyl / succinylcholine , zofran , Tdap, clevidipine  drip, propofol  drip, IV contrast Initial Vitals: 98.4, 28, 60, 138/69, 100% on RA  Significant labs:  I, Jenita Ruth Rust-Chester, AGACNP-BC, personally viewed and interpreted this ECG. EKG Interpretation: Date: 02/09/24, EKG Time: 19:17, Rate: 69, Rhythm: NSR, QRS Axis:  normal, Intervals: normal, ST/T Wave abnormalities: none, Narrative Interpretation: NSR  (Labs/ Imaging personally reviewed) Chemistry: Na+:141, K+: 3.8, BUN/Cr.: 20/0.64, Serum CO2/ AG: 25/13 Hematology: WBC: 6.5, Hgb: 10.9,  Troponin: <15, INR: 1.1  ABG: ***  CXR 02/09/24: No active cardiopulmonary process CT head wo contrast 02/09/24:  Fairly extensive left greater than right  subarachnoid hemorrhage. Acute bilateral subdural hematomas, left greater than right. Hematoma on the left measures up to 7 mm maximum thickness and there is approximately 4 mm midline shift to the right. 2. Large right scalp hematoma. Faint linear hypodensity in the right temporal and parietal bone, not clearly seen on the prior, and suspect for subtle nondisplaced skull fracture.  >> Repeat CT head wo contrast 02/09/24: Worsening of left cerebral convexity subdural hemorrhage, new left temporal lobe parenchymal hemorrhage, increased anterior right temporal fossa subdural hemorrhage, and widespread subarachnoid hemorrhage in the left greater than right hemispheres. 2. Worsening secondary mass effect with 2 cm left-to-right midline shift, subfalcine and left sided uncal herniation, increased crowding in the basal cisterns and increased cerebral edema. 3. Subtle right temporal bone nondisplaced linear skull fracture suspected with large right parietotemporal scalp hematoma. 4. No inferior herniation of the cerebellar tonsils at this time. CT maxillofacial wo contrast 02/09/24: Few punctate foci of intracranial gas along the inferior frontal lobes. Irregular appearance of the right sphenoid sinus with hyperdense fluid within and fluid level, concerning for hemorrhagic material. Some gas at the cavernous sinus and right greater than left central skull base, raising concern for potential sphenoid sinus/central skull base fracture, this could potentially account for the small amount of intracranial gas. CT angiography recommended to exclude vascular injury to the carotid artery. CT cervical spine wo contrast 02/09/24: Degenerative changes of the cervical spine. No acute osseous abnormality. CT angio head/neck w/wo contrast 02/09/24: Small (1 mm) inferiorly directed outpouching arising from the supraclinoid left ICA, compatible with infundibulum or aneurysm. A catheter arteriogram could further evaluate if  clinically warranted. 2. A blind ending vessel in the left  subdural hemorrhage likely is a vein that may be injured. 3. No large vessel occlusion or significant stenosis. CT chest/ abdomen/pelvis w contrast 02/09/24:   No acute intrathoracic, intra-abdominal, or intrapelvic trauma. 2. Cardiomegaly, with trace pericardial effusion. 3. Colonic diverticulosis without diverticulitis. 4. Moderate fecal retention throughout the colon. No bowel obstruction or ileus. 5.  Aortic Atherosclerosis  PCCM consulted for admission due to ***.  Pertinent  Medical History  CAD Anxiety & Depression CAD Bilateral carotid artery disease Anemia Fibromyalgia Bradycardia IBS OSA PSVT HLD Mitral Valve Prolapse Schatzki's ring Superior mesenteric artery stenosis spondylosis  Significant Hospital Events: Including procedures, antibiotic start and stop dates in addition to other pertinent events   02/09/24: Admit to ICU with ***  Interim History / Subjective:  ***  Objective    Blood pressure (!) 165/93, pulse 69, temperature 98.4 F (36.9 C), resp. rate 19, height 5' 7 (1.702 m), weight 54.4 kg, SpO2 97%.       No intake or output data in the 24 hours ending 02/09/24 2331 Filed Weights   02/09/24 1906 02/09/24 2040  Weight: 53.1 kg 54.4 kg    Examination: General: Adult ***, critically***acutely ill, lying in bed intubated & sedated requiring mechanical ventilation *** NAD HEENT: MM pink/moist, anicteric***, atraumatic, neck supple Neuro: A&O x *** commands, PERRL *** , MAE CV: s1s2 ***RRR, *** on monitor, no r/m/g Pulm: Regular, non labored on *** , breath sounds ***-BUL & ***-BLL GI: soft, ***, non***tender, bs x 4 GU: foley in place *** with clear yellow urine Skin: *** no rashes/lesions noted Extremities: warm/dry, pulses + 2 R/P, *** edema noted  Resolved problem list   Assessment and Plan  Acute Traumatic Severe Bilateral Subdural Hemorrhage secondary to fall s/p craniotomy *** -  Goal SBP < 160 using prn Labetolol or Nicardipine Gtt if needed - HOB >= 30 degrees with aspiration precautions - Q1 neuro checks -- get stat head CT and call Neurology if acute change - Goal Na>145 w/ Q2 hr Na checks, PRN 3%NS (bolus via central line) - INR goal <1.4, daily coags.  - Pain control - if sedation is necessary: would recommend dex for sedation and ease of exam  - MRI ICH strotocol when stable - No HepSQ, antiplatelet or anticoagulant agents at this time - Maintain strict euglycemia, euthermia, and euvolemia    Elevate head of bed keep head midline. Blood pressure: MAP >65 SBP <140: Continue nicardipine and titrate as needed to maintain SBP<140. Labetalol 20mg  every 10 minutes as needed if SBP>140.   Consult neurosurgery. Hold antiplatelets and anticoagulation for now. IV fluids gentle hydration. Repeat CT head in 6 hours (or sooner if clinical worsening). Echocardiogram. MRI brain without contrast when able. MRA head and neck. Keep platelets >100k, INR<1.4 Replete electrolytes as needed. Labs: Mg, Phos, lipids, HbA1c, urinalysis. Maintain O2 sats > 94%. Normothermia - For temperature >37.5C - acetaminophen  650mg  q4-6 hours PRN. Relative euglycemia (~ <180) and treat if hyperglycemia (>200 mg/dL)/hypoglycemia (< 60mg /dL).  Euvolemia - Strict I/Os. PPx: SCDs for now, Senna/docusate, PPI. Precautions: Aspiration/seizure/fall.  Acute Hypoxic / Hypercapnic Respiratory Failure secondary to / in the setting of PMHx: *** - Ventilator settings: PRVC  6***8 mL/kg, *** FiO2, *** PEEP, continue ventilator support & lung protective strategies - Wean PEEP & FiO2 as tolerated, maintain SpO2 > 90%*** - Head of bed elevated 30 degrees, VAP protocol in place - Plateau pressures less than 30 cm H20  - Intermittent chest x-ray & ABG PRN*** - Daily WUA  with SBT as tolerated  - Ensure adequate pulmonary hygiene  - F/u cultures, trend PCT - Continue CAP/***Aspiration Pna coverage  cefepime/ *** unasyn  - Steroids initiated: solu-medrol  *** mg BID /*** - Budesonide  inhaler***nebs BID, bronchodilators PRN - PAD protocol in place: continue Fentanyl ***Dilaudid  drip***IVP & Propofol ***Versed  drip***IVP  Labs   CBC: Recent Labs  Lab 02/09/24 1957  WBC 6.5  HGB 10.9*  HCT 33.1*  MCV 92.2  PLT 268    Basic Metabolic Panel: Recent Labs  Lab 02/09/24 1957  NA 141  K 3.8  CL 103  CO2 25  GLUCOSE 127*  BUN 20  CREATININE 0.64  CALCIUM  9.3   GFR: Estimated Creatinine Clearance: 49 mL/min (by C-G formula based on SCr of 0.64 mg/dL). Recent Labs  Lab 02/09/24 1957  WBC 6.5    Liver Function Tests: Recent Labs  Lab 02/09/24 1957  AST 27  ALT 27  ALKPHOS 58  BILITOT <0.2  PROT 6.4*  ALBUMIN 4.1   No results for input(s): LIPASE, AMYLASE in the last 168 hours. No results for input(s): AMMONIA in the last 168 hours.  ABG    Component Value Date/Time   TCO2 27 11/23/2008 1501     Coagulation Profile: Recent Labs  Lab 02/09/24 1957  INR 1.1    Cardiac Enzymes: No results for input(s): CKTOTAL, CKMB, CKMBINDEX, TROPONINI in the last 168 hours.  HbA1C: Hgb A1c MFr Bld  Date/Time Value Ref Range Status  06/03/2023 08:48 AM 5.9 4.6 - 6.5 % Final    Comment:    Glycemic Control Guidelines for People with Diabetes:Non Diabetic:  <6%Goal of Therapy: <7%Additional Action Suggested:  >8%   02/06/2023 01:52 PM 6.1 4.6 - 6.5 % Final    Comment:    Glycemic Control Guidelines for People with Diabetes:Non Diabetic:  <6%Goal of Therapy: <7%Additional Action Suggested:  >8%     CBG: No results for input(s): GLUCAP in the last 168 hours.  Review of Systems:   UTA- intubated and sedated at this time.  Past Medical History:  She,  has a past medical history of Allergy , Anemia, Angina pectoris, Anxiety, Aortic atherosclerosis, Asthma, Bilateral carotid artery disease, Bradycardia, CAD (coronary artery disease), Chronic shortness of  breath, Complication of anesthesia, Depression, Diastolic dysfunction, Diverticulosis, Duodenal diverticulum, Family history of adverse reaction to anesthesia, Fibromyalgia, Gastroesophageal reflux disease with hiatal hernia, Hiatal hernia, History of bilateral cataract extraction, History of kidney stones, Hyperlipidemia, IBS (irritable bowel syndrome), Insomnia, Left inguinal hernia, Long term current use of clopidogrel , Long-term use of aspirin  therapy, Lymphocytic colitis, Migraine headache, Mitral valve prolapse, Neuropathy, Orthostatic hypotension, OSA (obstructive sleep apnea), Osteoporosis, Palpitations, Peripheral neuropathy, PONV (postoperative nausea and vomiting), PSVT (paroxysmal supraventricular tachycardia), 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, RLS (restless legs syndrome), Schatzki's ring, Spondylosis, and Superior mesenteric artery stenosis.   Surgical History:   Past Surgical History:  Procedure Laterality Date   ANAL RECTAL MANOMETRY N/A 11/14/2015   Procedure: ANO RECTAL MANOMETRY;  Surgeon: Lupita FORBES Commander, MD;  Location: [facility];  Service: Endoscopy;  Laterality: N/A;   BREAST ENHANCEMENT SURGERY     CATARACT EXTRACTION Bilateral    CHOLECYSTECTOMY     COLONOSCOPY     ESOPHAGEAL DILATION     X 2   HERNIORRHAPHY, INGUINAL, ROBOT-ASSISTED, LAPAROSCOPIC Left 01/26/2024   Procedure: HERNIORRHAPHY, INGUINAL, ROBOT-ASSISTED, LAPAROSCOPIC;  Surgeon: Desiderio Schanz, MD;  Location: [facility] ORS;  Service: General;  Laterality: Left;   INSERTION OF MESH  01/26/2024   Procedure: INSERTION OF MESH;  Surgeon: Desiderio Schanz, MD;  Location: [facility] ORS;  Service: General;;   RENAL INTERVENTION N/A 10/07/2023   Procedure: RENAL INTERVENTION;  Surgeon: Marea Selinda RAMAN, MD;  Location: [facility] LAB;  Service: Cardiovascular;  Laterality: N/A;   ROTATOR CUFF REPAIR Left    SINUS ENDO W/FUSION N/A 05/30/2014   Procedure: REVISION  FRONTAL SINUS SURGERY WITH FUSION SCAN;   Surgeon: Alm Bouche, MD;  Location: [facility] OR;  Service: ENT;  Laterality: N/A;   SINUS SURGERY WITH INSTATRAK     x 2   TUBAL LIGATION     UPPER GASTROINTESTINAL ENDOSCOPY     Vaginal cystectomy     x 2   VAGINAL HYSTERECTOMY  05/2007   Vaginal repair, Dr Cary.  Partial  hysterectomy.   VISCERAL ANGIOGRAPHY N/A 10/07/2023   Procedure: VISCERAL ANGIOGRAPHY;  Surgeon: Marea Selinda RAMAN, MD;  Location: [facility] INVAS[facility] CV LAB;  Service: Cardiovascular;  Laterality: N/A;   WRIST ARTHROSCOPY       Social History:   reports that she quit smoking about 25 years ago. Her smoking use included cigarettes. She started smoking about 40 years ago. She has a 7.5 pack-year smoking history. She has been exposed to tobacco smoke. She has never used smokeless tobacco. She reports that she does not drink alcohol  and does not use drugs.   Family History:  Her family history includes Anemia in her mother; Coronary artery disease in her brother; Diabetes in her maternal uncle; Heart attack in her father and paternal uncle; Hypertension in her mother; Hypertension (age of onset: 39) in her father; Neuropathy in her mother; Stroke (age of onset: 57) in her father. There is no history of Colon cancer, Seizures, Esophageal cancer, Rectal cancer, or Stomach cancer.   Allergies Allergies  Allergen Reactions   Tape Rash   Albuterol      heartburn   Latex Other (See Comments)   Lyrica  [Pregabalin ] Other (See Comments)    Sedation   Neurontin  [Gabapentin ] Other (See Comments)    Sedation   Nitrofuran Derivatives Other (See Comments)    tingling   Paxil [Paroxetine Hcl]     Bowel upset, tingling 01/22/24 pt taking generic   Prozac  [Fluoxetine  Hcl] Other (See Comments)    Made patient worse and constipation   Cymbalta  [Duloxetine  Hcl] Nausea Only and Anxiety    Diarrhea, nausea, anxiety worse, shaky     Home Medications  Prior to Admission medications   Medication Sig Start Date End Date Taking? Authorizing  Provider  acetaminophen  (TYLENOL ) 500 MG tablet Take 2 tablets (1,000 mg total) by mouth every 6 (six) hours as needed for mild pain (pain score 1-3). 01/26/24   Desiderio Schanz, MD  aspirin  EC 81 MG tablet Take 1 tablet (81 mg total) by mouth daily. Swallow whole. 10/07/23 10/06/24  Marea Selinda RAMAN, MD  Azelastine -Fluticasone  137-50 MCG/ACT SUSP PLACE 1 SPRAY INTO THE NOSE EVERY 12 (TWELVE) HOURS. Patient taking differently: Place 1 spray into both nostrils daily as needed (allergies). 06/30/23   Geofm Glade PARAS, MD  budeson-glycopyrrolate -formoterol  (BREZTRI  AEROSPHERE) 160-9-4.8 MCG/ACT AERO Inhale 2 puffs into the lungs 2 (two) times daily. 06/02/23   Geofm Glade PARAS, MD  clopidogrel  (PLAVIX ) 75 MG tablet Take 1 tablet (75 mg total) by mouth daily. 10/07/23   Marea Selinda RAMAN, MD  Cyanocobalamin  (VITAMIN B-12 PO) Take 1 tablet by mouth daily.    [provider]  famotidine  (PEPCID ) 40 MG tablet TAKE 1 TABLET (40 MG TOTAL) BY MOUTH 2 (TWO)  TIMES DAILY AS NEEDED FOR HEARTBURN OR INDIGESTION. 02/01/24   Geofm Glade PARAS, MD  fluticasone -salmeterol (WIXELA INHUB) 100-50 MCG/ACT AEPB Inhale 1 puff into the lungs 2 (two) times daily. 08/07/23   Geofm Glade PARAS, MD  hydrOXYzine (ATARAX) 25 MG tablet Take 25 mg by mouth daily. 01/14/23   [provider]  levalbuterol  (XOPENEX  HFA) 45 MCG/ACT inhaler Inhale 1-2 puffs into the lungs every 4 (four) hours as needed for wheezing. 06/02/23   Geofm Glade PARAS, MD  midodrine  (PROAMATINE ) 10 MG tablet TAKE 1 TABLET BY MOUTH TWICE A DAY 02/08/24   Geofm Glade PARAS, MD  ondansetron  (ZOFRAN -ODT) 4 MG disintegrating tablet Take 1 tablet (4 mg total) by mouth every 8 (eight) hours as needed for nausea or vomiting. 08/07/23   Geofm Glade PARAS, MD  OVER THE COUNTER MEDICATION Bone Up 1000 mg 2 tabs TID    [provider]  OVER THE COUNTER MEDICATION B-Right (B-Complex) 1 po daily    [provider]  oxyCODONE  (OXY IR/ROXICODONE ) 5 MG immediate release tablet Take 1  tablet (5 mg total) by mouth every 4 (four) hours as needed for severe pain (pain score 7-10). 01/26/24   Desiderio Schanz, MD  pantoprazole  (PROTONIX ) 40 MG tablet Take 40 mg by mouth daily.    [provider]  PARoxetine (PAXIL) 20 MG tablet Take 20 mg by mouth at bedtime. 06/02/23   [provider]  potassium chloride  (KLOR-CON ) 20 MEQ packet DISSOLVE CONTENTS OF 1 PACKET DAILY 12/29/23   Avram Lupita BRAVO, MD  Probiotic Product (ALIGN) 4 MG CAPS Take 1 capsule by mouth daily.    [provider]  RESTASIS 0.05 % ophthalmic emulsion Place 1 drop into both eyes 2 (two) times daily. 02/15/21   [provider]  rosuvastatin  (CRESTOR ) 5 MG tablet TAKE 1 TABLET (5 MG TOTAL) BY MOUTH DAILY FOR CHOLESTEROL 07/23/23   Burns, Glade PARAS, MD  traZODone  (DESYREL ) 100 MG tablet Take 100 mg by mouth at bedtime.    [provider]  traZODone  (DESYREL ) 50 MG tablet Take 1 tablet (50 mg total) by mouth at bedtime. 09/09/23   Leonce Katz, DO  vitamin C (ASCORBIC ACID) 500 MG tablet Take 500 mg by mouth daily.    [provider]  vitamin E 180 MG (400 UNITS) capsule Take 400 Units by mouth daily.    [provider]  Hyoscyamine -Phenyltoloxamine (DIGEX NF) 0.0625-15 MG CAPS Take one tablet by mouth as needed for abdominal cramping 04/15/11 05/20/11  Jakie Alm SAUNDERS, MD     Critical care time: ***       Jenita Ruth Rust-Chester, AGACNP-BC Acute Care Nurse Practitioner [facility]   [phone] / [phone] Please see Amion for details.

## 2024-03-03 DEATH — deceased

## 2024-03-04 ENCOUNTER — Ambulatory Visit

## 2024-03-16 ENCOUNTER — Ambulatory Visit: Admitting: Internal Medicine

## 2024-05-13 ENCOUNTER — Encounter: Admitting: Internal Medicine

## 2024-05-20 ENCOUNTER — Ambulatory Visit: Payer: Self-pay | Admitting: Cardiovascular Disease

## 2024-06-13 ENCOUNTER — Ambulatory Visit: Admitting: Internal Medicine

## 2024-07-05 ENCOUNTER — Encounter (INDEPENDENT_AMBULATORY_CARE_PROVIDER_SITE_OTHER)

## 2024-07-05 ENCOUNTER — Ambulatory Visit (INDEPENDENT_AMBULATORY_CARE_PROVIDER_SITE_OTHER): Admitting: Vascular Surgery

## 2025-01-09 ENCOUNTER — Ambulatory Visit: Admitting: Internal Medicine
